# Patient Record
Sex: Male | Born: 1940
Health system: Southern US, Community
[De-identification: ages and names within clinical notes are randomized; demographics above are authoritative.]

## PROBLEM LIST (undated history)

## (undated) DIAGNOSIS — I10 Essential (primary) hypertension: Secondary | ICD-10-CM

## (undated) DIAGNOSIS — D72819 Decreased white blood cell count, unspecified: Secondary | ICD-10-CM

## (undated) DIAGNOSIS — M1990 Unspecified osteoarthritis, unspecified site: Secondary | ICD-10-CM

## (undated) DIAGNOSIS — E538 Deficiency of other specified B group vitamins: Secondary | ICD-10-CM

## (undated) DIAGNOSIS — J449 Chronic obstructive pulmonary disease, unspecified: Secondary | ICD-10-CM

## (undated) DIAGNOSIS — R7302 Impaired glucose tolerance (oral): Secondary | ICD-10-CM

## (undated) DIAGNOSIS — E785 Hyperlipidemia, unspecified: Secondary | ICD-10-CM

## (undated) DIAGNOSIS — J45909 Unspecified asthma, uncomplicated: Secondary | ICD-10-CM

## (undated) DIAGNOSIS — M8580 Other specified disorders of bone density and structure, unspecified site: Secondary | ICD-10-CM

## (undated) DIAGNOSIS — F1010 Alcohol abuse, uncomplicated: Secondary | ICD-10-CM

## (undated) DIAGNOSIS — N400 Enlarged prostate without lower urinary tract symptoms: Secondary | ICD-10-CM

## (undated) DIAGNOSIS — E119 Type 2 diabetes mellitus without complications: Secondary | ICD-10-CM

## (undated) DIAGNOSIS — K4190 Unilateral femoral hernia, without obstruction or gangrene, not specified as recurrent: Secondary | ICD-10-CM

## (undated) DIAGNOSIS — K4090 Unilateral inguinal hernia, without obstruction or gangrene, not specified as recurrent: Secondary | ICD-10-CM

## (undated) DIAGNOSIS — K432 Incisional hernia without obstruction or gangrene: Secondary | ICD-10-CM

## (undated) DIAGNOSIS — D649 Anemia, unspecified: Secondary | ICD-10-CM

## (undated) DIAGNOSIS — F0390 Unspecified dementia without behavioral disturbance: Secondary | ICD-10-CM

## (undated) DIAGNOSIS — D72821 Monocytosis (symptomatic): Secondary | ICD-10-CM

## (undated) HISTORY — DX: Type 2 diabetes mellitus without complications: E11.9

## (undated) HISTORY — PX: HEMORRHOID SURGERY: SHX153

## (undated) HISTORY — DX: Unilateral inguinal hernia, without obstruction or gangrene, not specified as recurrent: K40.90

## (undated) HISTORY — PX: HERNIA REPAIR: SHX51

## (undated) HISTORY — DX: Impaired glucose tolerance (oral): R73.02

## (undated) HISTORY — DX: Anemia, unspecified: D64.9

## (undated) HISTORY — DX: Essential (primary) hypertension: I10

## (undated) HISTORY — DX: Unilateral femoral hernia, without obstruction or gangrene, not specified as recurrent: K41.90

## (undated) HISTORY — DX: Unspecified asthma, uncomplicated: J45.909

## (undated) HISTORY — DX: Deficiency of other specified B group vitamins: E53.8

## (undated) HISTORY — DX: Hyperlipidemia, unspecified: E78.5

## (undated) HISTORY — DX: Unspecified osteoarthritis, unspecified site: M19.90

## (undated) HISTORY — DX: Alcohol abuse, uncomplicated: F10.10

## (undated) HISTORY — DX: Chronic obstructive pulmonary disease, unspecified: J44.9

## (undated) HISTORY — DX: Decreased white blood cell count, unspecified: D72.819

## (undated) HISTORY — DX: Incisional hernia without obstruction or gangrene: K43.2

## (undated) HISTORY — DX: Other specified disorders of bone density and structure, unspecified site: M85.80

## (undated) HISTORY — PX: TUMOR EXCISION: SHX421

## (undated) HISTORY — DX: Unspecified dementia, unspecified severity, without behavioral disturbance, psychotic disturbance, mood disturbance, and anxiety: F03.90

## (undated) HISTORY — DX: Monocytosis (symptomatic): D72.821

---

## 1998-07-14 ENCOUNTER — Encounter: Admission: RE | Admit: 1998-07-14 | Discharge: 1998-07-14 | Payer: Self-pay | Admitting: Internal Medicine

## 1998-07-16 ENCOUNTER — Encounter: Admission: RE | Admit: 1998-07-16 | Discharge: 1998-07-16 | Payer: Self-pay | Admitting: Hematology and Oncology

## 1999-07-07 ENCOUNTER — Emergency Department (HOSPITAL_COMMUNITY): Admission: EM | Admit: 1999-07-07 | Discharge: 1999-07-07 | Payer: Self-pay | Admitting: Emergency Medicine

## 2003-10-11 ENCOUNTER — Emergency Department (HOSPITAL_COMMUNITY): Admission: EM | Admit: 2003-10-11 | Discharge: 2003-10-11 | Payer: Self-pay | Admitting: Emergency Medicine

## 2003-12-25 ENCOUNTER — Emergency Department (HOSPITAL_COMMUNITY): Admission: EM | Admit: 2003-12-25 | Discharge: 2003-12-25 | Payer: Self-pay | Admitting: Emergency Medicine

## 2003-12-29 ENCOUNTER — Emergency Department (HOSPITAL_COMMUNITY): Admission: EM | Admit: 2003-12-29 | Discharge: 2003-12-29 | Payer: Self-pay | Admitting: Emergency Medicine

## 2004-05-03 ENCOUNTER — Emergency Department (HOSPITAL_COMMUNITY): Admission: EM | Admit: 2004-05-03 | Discharge: 2004-05-03 | Payer: Self-pay | Admitting: Emergency Medicine

## 2006-08-03 ENCOUNTER — Emergency Department (HOSPITAL_COMMUNITY): Admission: EM | Admit: 2006-08-03 | Discharge: 2006-08-03 | Payer: Self-pay | Admitting: Emergency Medicine

## 2007-02-25 ENCOUNTER — Emergency Department (HOSPITAL_COMMUNITY): Admission: EM | Admit: 2007-02-25 | Discharge: 2007-02-25 | Payer: Self-pay | Admitting: *Deleted

## 2009-07-12 ENCOUNTER — Emergency Department (HOSPITAL_COMMUNITY): Admission: EM | Admit: 2009-07-12 | Discharge: 2009-07-12 | Payer: Self-pay | Admitting: Emergency Medicine

## 2010-05-08 ENCOUNTER — Emergency Department (HOSPITAL_COMMUNITY): Admission: EM | Admit: 2010-05-08 | Discharge: 2010-05-08 | Payer: Self-pay | Admitting: Emergency Medicine

## 2010-10-20 LAB — DIFFERENTIAL
Basophils Absolute: 0 10*3/uL (ref 0.0–0.1)
Basophils Relative: 0 % (ref 0–1)
Eosinophils Absolute: 0 10*3/uL (ref 0.0–0.7)
Monocytes Absolute: 0.7 10*3/uL (ref 0.1–1.0)
Monocytes Relative: 12 % (ref 3–12)
Neutro Abs: 4.1 10*3/uL (ref 1.7–7.7)
Neutrophils Relative %: 70 % (ref 43–77)

## 2010-10-20 LAB — CBC
Hemoglobin: 13.8 g/dL (ref 13.0–17.0)
MCH: 31.2 pg (ref 26.0–34.0)
MCHC: 34.2 g/dL (ref 30.0–36.0)
RDW: 14.7 % (ref 11.5–15.5)

## 2010-10-20 LAB — POCT I-STAT, CHEM 8
Calcium, Ion: 1.14 mmol/L (ref 1.12–1.32)
Chloride: 107 mEq/L (ref 96–112)
Glucose, Bld: 107 mg/dL — ABNORMAL HIGH (ref 70–99)
HCT: 43 % (ref 39.0–52.0)
Hemoglobin: 14.6 g/dL (ref 13.0–17.0)

## 2010-10-20 LAB — POCT CARDIAC MARKERS

## 2011-05-23 LAB — URINE MICROSCOPIC-ADD ON

## 2011-05-23 LAB — URINALYSIS, ROUTINE W REFLEX MICROSCOPIC
Bilirubin Urine: NEGATIVE
Glucose, UA: NEGATIVE
Ketones, ur: NEGATIVE
Specific Gravity, Urine: 1.01
pH: 6

## 2011-05-23 LAB — URINE CULTURE: Colony Count: NO GROWTH

## 2012-01-10 ENCOUNTER — Ambulatory Visit (INDEPENDENT_AMBULATORY_CARE_PROVIDER_SITE_OTHER): Payer: Medicare Other | Admitting: Surgery

## 2012-01-10 ENCOUNTER — Encounter (INDEPENDENT_AMBULATORY_CARE_PROVIDER_SITE_OTHER): Payer: Self-pay | Admitting: Surgery

## 2012-01-10 VITALS — BP 98/64 | HR 86 | Temp 97.4°F | Resp 14 | Ht 73.0 in | Wt 184.0 lb

## 2012-01-10 DIAGNOSIS — K4090 Unilateral inguinal hernia, without obstruction or gangrene, not specified as recurrent: Secondary | ICD-10-CM

## 2012-01-10 DIAGNOSIS — E119 Type 2 diabetes mellitus without complications: Secondary | ICD-10-CM | POA: Insufficient documentation

## 2012-01-10 DIAGNOSIS — K432 Incisional hernia without obstruction or gangrene: Secondary | ICD-10-CM

## 2012-01-10 HISTORY — DX: Unilateral inguinal hernia, without obstruction or gangrene, not specified as recurrent: K40.90

## 2012-01-10 HISTORY — DX: Type 2 diabetes mellitus without complications: E11.9

## 2012-01-10 HISTORY — DX: Incisional hernia without obstruction or gangrene: K43.2

## 2012-01-10 NOTE — Progress Notes (Addendum)
Subjective:     Patient ID: Cory Kemp, male   DOB: Sep 12, 1940, 71 y.o.   MRN: [id number]  HPI  Cory Kemp  [birth year] [id number]  Patient Care Team: Massie Maroon, MD as PCP - General (Internal Medicine)  This patient is a 71 y.o.male who presents today for surgical evaluation at the request of Dr. Selena Batten.   Reason for visit: Right inguinal hernia.  Patient is a pleasant smoking male. Still working and moderately active. 2 months ago he noticed a lump in his right groin. This gradually increased. Occasionally uncomfortable. He has intentionally lost 40 pounds over the past year. He switched to a low sugar diet. Because of concerns, and his wife mentioned to his primary care physician and sent the patient to [place] for surgical evaluation.  He likes to travel out [place]. [place] with [employer]. 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   He is quite active. Continues to smoke but has pretty good activity level  Patient Active Problem List  Diagnoses  . Diabetes mellitus  . Inguinal hernia - right  . Incisional hernias - swiis cheeses type periumbilical    Past Medical History  Diagnosis Date  . Diabetes mellitus 01/10/2012  . Arthritis   . Hyperlipidemia   . Hypertension     Past Surgical History  Procedure Date  . Hemorrhoid surgery   . Tumor excision     History   Social History  . Marital Status: Single    Spouse Name: N/A    Number of Children: N/A  . Years of Education: N/A   Occupational History  . Not on file.   Social History Main Topics  . Smoking status: Not on file  . Smokeless tobacco: Not on file  . Alcohol Use:   . Drug Use:   . Sexually Active:    Other Topics Concern  . Not on file   Social History Narrative  . No narrative on file    History reviewed. No pertinent family history.  Current Outpatient Prescriptions  Medication Sig Dispense Refill  . cholecalciferol (VITAMIN D) 1000 UNITS tablet Take 1,000 Units by  mouth 2 (two) times daily.      . finasteride (PROSCAR) 5 MG tablet       . losartan (COZAAR) 50 MG tablet       . metFORMIN (GLUCOPHAGE) 500 MG tablet       . simvastatin (ZOCOR) 40 MG tablet       . Tamsulosin HCl (FLOMAX PO) Take by mouth.         No Known Allergies  BP 98/64  Pulse 86  Temp(Src) 97.4 F (36.3 C) (Temporal)  Resp 14  Ht 6\' 1"  (1.854 m)  Wt 184 lb (83.462 kg)  BMI 24.28 kg/m2  No results found.   Review of Systems  Constitutional: Negative for fever, chills and diaphoresis.  HENT: Negative for hearing loss, nosebleeds, sore throat, facial swelling, mouth sores, trouble swallowing and ear discharge.   Eyes: Negative for photophobia, discharge and visual disturbance.  Respiratory: Negative for choking, chest tightness, shortness of breath and stridor.   Cardiovascular: Negative for chest pain and palpitations.       Patient walks 1.5 miles without difficulty.  No exertional chest/neck/shoulder/arm pain.  Gastrointestinal: Negative for nausea, vomiting, abdominal pain, diarrhea, constipation, blood in stool, abdominal distention, anal bleeding and rectal pain.       No personal nor family history of GI/colon  cancer, inflammatory bowel disease, irritable bowel syndrome, allergy such as Celiac Sprue, dietary/dairy problems, colitis, ulcers nor gastritis.    No recent sick contacts/gastroenteritis.  No travel outside the country.  No changes in diet.  BM daily  Genitourinary: Positive for difficulty urinating. Negative for dysuria, urgency, frequency and testicular pain.  Musculoskeletal: Negative for myalgias, back pain, arthralgias and gait problem.  Skin: Negative for color change, pallor, rash and wound.  Neurological: Negative for dizziness, speech difficulty, weakness, numbness and headaches.  Hematological: Negative for adenopathy. Does not bruise/bleed easily.  Psychiatric/Behavioral: Negative for hallucinations, confusion and agitation.         Objective:   Physical Exam  Constitutional: He is oriented to person, place, and time. He appears well-developed and well-nourished. No distress.  HENT:  Head: Normocephalic.  Mouth/Throat: Oropharynx is clear and moist. No oropharyngeal exudate.  Eyes: Conjunctivae and EOM are normal. Pupils are equal, round, and reactive to light. No scleral icterus.  Neck: Normal range of motion. Neck supple. No tracheal deviation present.  Cardiovascular: Normal rate, regular rhythm and intact distal pulses.   Pulmonary/Chest: Effort normal and breath sounds normal. No respiratory distress.  Abdominal: Soft. He exhibits no distension. There is no tenderness. There is no CVA tenderness and no tenderness at McBurney's point. A hernia is present. Hernia confirmed positive in the ventral area and confirmed positive in the right inguinal area. Hernia confirmed negative in the left inguinal area.    Musculoskeletal: Normal range of motion. He exhibits no tenderness.  Lymphadenopathy:    He has no cervical adenopathy.       Right: No inguinal adenopathy present.       Left: No inguinal adenopathy present.  Neurological: He is alert and oriented to person, place, and time. No cranial nerve deficit. He exhibits normal muscle tone. Coordination normal.  Skin: Skin is warm and dry. No rash noted. He is not diaphoretic. No erythema. No pallor.  Psychiatric: He has a normal mood and affect. His behavior is normal. Judgment and thought content normal.       Assessment:     RIH & periumbilical incisional VWH    Plan:     I think he would benefit from surgery to repair the hernias. Definitely the right inguinal one. He was interested in having the periumbilical hernia was repaired at the same time. He initially was hoping to get back to work and travel within a week. I cautioned him that he will need time to recover from this.   Eventually he should be able to get back to regular activity within reason. Did not  push through pain now nor after surgery  The anatomy & physiology of the abdominal wall and pelvic floor was discussed.  The pathophysiology of hernias in the inguinal and pelvic region was discussed.  Natural history risks such as progressive enlargement, pain, incarceration & strangulation was discussed.   Contributors to complications such as smoking, obesity, diabetes, prior surgery, etc were discussed.    I feel the risks of no intervention will lead to serious problems that outweigh the operative risks; therefore, I recommended surgery to reduce and repair the hernia.  I explained laparoscopic techniques with possible need for an open approach.  I noted usual use of mesh to patch and/or buttress hernia repair  Risks such as bleeding, infection, abscess, need for further treatment, heart attack, death, and other risks were discussed.  I noted a good likelihood this will help address the problem.  Goals of post-operative recovery were discussed as well.  Possibility that this will not correct all symptoms was explained.  I stressed the importance of low-impact activity, aggressive pain control, avoiding constipation, & not pushing through pain to minimize risk of post-operative chronic pain or injury. Possibility of reherniation was discussed.  We will work to minimize complications.     An educational handout further explaining the pathology & treatment options was given as well.  Questions were answered.  The patient & his wife express understanding & wishes to proceed with surgery.  We talked to the patient about the dangers of smoking.  We stressed that tobacco use dramatically increases the risk of peri-operative complications such as infection, tissue necrosis leaving to problems with incision/wound and organ healing, heart attack, stroke, DVT, pulmonary embolism, and death.  We noted there are programs in our community to help stop smoking.

## 2012-01-10 NOTE — Patient Instructions (Signed)
Hernia  A hernia occurs when an internal organ pushes out through a weak spot in the abdominal wall. Hernias most commonly occur in the groin and around the navel. Hernias often can be pushed back into place (reduced). Most hernias tend to get worse over time. Some abdominal hernias can get stuck in the opening (irreducible or incarcerated hernia) and cannot be reduced. An irreducible abdominal hernia which is tightly squeezed into the opening is at risk for impaired blood supply (strangulated hernia). A strangulated hernia is a medical emergency. Because of the risk for an irreducible or strangulated hernia, surgery may be recommended to repair a hernia.  CAUSES    Heavy lifting.   Prolonged coughing.   Straining to have a bowel movement.   A cut (incision) made during an abdominal surgery.  HOME CARE INSTRUCTIONS    Bed rest is not required. You may continue your normal activities.   Avoid lifting more than 10 pounds (4.5 kg) or straining.   Cough gently. If you are a smoker it is best to stop. Even the best hernia repair can break down with the continual strain of coughing. Even if you do not have your hernia repaired, a cough will continue to aggravate the problem.   Do not wear anything tight over your hernia. Do not try to keep it in with an outside bandage or truss. These can damage abdominal contents if they are trapped within the hernia sac.   Eat a normal diet.   Avoid constipation. Straining over long periods of time will increase hernia size and encourage breakdown of repairs. If you cannot do this with diet alone, stool softeners may be used.  SEEK IMMEDIATE MEDICAL CARE IF:    You have a fever.   You develop increasing abdominal pain.   You feel nauseous or vomit.   Your hernia is stuck outside the abdomen, looks discolored, feels hard, or is tender.   You have any changes in your bowel habits or in the hernia that are unusual for you.   You have increased pain or swelling around the  hernia.   You cannot push the hernia back in place by applying gentle pressure while lying down.  MAKE SURE YOU:    Understand these instructions.   Will watch your condition.   Will get help right away if you are not doing well or get worse.  Document Released: 07/25/2005 Document Revised: 07/14/2011 Document Reviewed: 03/13/2008  ExitCare Patient Information 2012 [employer].

## 2012-02-13 ENCOUNTER — Encounter (HOSPITAL_COMMUNITY): Payer: Self-pay | Admitting: Pharmacy Technician

## 2012-02-20 ENCOUNTER — Encounter (HOSPITAL_COMMUNITY)
Admission: RE | Admit: 2012-02-20 | Discharge: 2012-02-20 | Disposition: A | Discharge status: Home / Self Care | Payer: Medicare Other | Source: Ambulatory Visit | Attending: Surgery | Admitting: Surgery

## 2012-02-20 ENCOUNTER — Encounter (HOSPITAL_COMMUNITY): Payer: Self-pay

## 2012-02-20 ENCOUNTER — Ambulatory Visit (HOSPITAL_COMMUNITY)
Admission: RE | Admit: 2012-02-20 | Discharge: 2012-02-20 | Disposition: A | Discharge status: Home / Self Care | Payer: Medicare Other | Source: Ambulatory Visit | Attending: Surgery | Admitting: Surgery

## 2012-02-20 DIAGNOSIS — Z01812 Encounter for preprocedural laboratory examination: Secondary | ICD-10-CM | POA: Insufficient documentation

## 2012-02-20 DIAGNOSIS — Z01818 Encounter for other preprocedural examination: Secondary | ICD-10-CM | POA: Insufficient documentation

## 2012-02-20 LAB — CBC
MCHC: 33.5 g/dL (ref 30.0–36.0)
Platelets: 194 10*3/uL (ref 150–400)
RDW: 14.6 % (ref 11.5–15.5)
WBC: 3.1 10*3/uL — ABNORMAL LOW (ref 4.0–10.5)

## 2012-02-20 LAB — BASIC METABOLIC PANEL
BUN: 15 mg/dL (ref 6–23)
Chloride: 102 mEq/L (ref 96–112)
GFR calc Af Amer: >90 mL/min (ref >90)
GFR calc non Af Amer: 87 mL/min — ABNORMAL LOW (ref >90)
Potassium: 4.7 mEq/L (ref 3.5–5.1)

## 2012-02-20 LAB — SURGICAL PCR SCREEN: MRSA, PCR: NEGATIVE

## 2012-02-20 NOTE — Progress Notes (Signed)
02/20/12 0913  OBSTRUCTIVE SLEEP APNEA  Have you ever been diagnosed with sleep apnea through a sleep study? No  Do you snore loudly (loud enough to be heard through closed doors)?  1  Do you often feel tired, fatigued, or sleepy during the daytime? 0  Has anyone observed you stop breathing during your sleep? 0  Do you have, or are you being treated for high blood pressure? 1  BMI more than 35 kg/m2? 0  Age over 71 years old? 1  Neck circumference greater than 40 cm/18 inches? 0  Gender: 1  Obstructive Sleep Apnea Score 4   Score 4 or greater  Updated health history

## 2012-02-20 NOTE — Patient Instructions (Addendum)
20 TRESON LAURA  02/20/2012   Your procedure is scheduled on:  Friday 02/24/2012 at 0730 am  Report to [facility] at 0530 AM.  Call this number if you have problems the morning of surgery: [phone]   Remember:   Do not eat food:After Midnight.  May have clear liquids:until Midnight .    Take these medicines the morning of surgery with A SIP OF WATER: none   Do not wear jewelry  Do not wear lotions, powders, or perfumes.   Do not shave 48 hours prior to surgery. Men may shave face and neck.  Do not bring valuables to the hospital.  Contacts, dentures or bridgework may not be worn into surgery.       Patients discharged the day of surgery will not be allowed to drive home.  Name and phone number of your driver: Annice Pih Bowen-significant other-cell-[phone]  Special Instructions: CHG Shower Use Special Wash: 1/2 bottle night before surgery and 1/2 bottle morning of surgery.   Please read over the following fact sheets that you were given: MRSA Information, sleep apnea sheet, incentive spirometry sheet               If you have any questions, please call me at [phone] [facility].Georgeanna Lea, RN,BSN

## 2012-02-24 ENCOUNTER — Encounter (HOSPITAL_COMMUNITY)
Admission: RE | Disposition: A | Discharge status: Home / Self Care | Payer: Self-pay | Source: Ambulatory Visit | Attending: Surgery

## 2012-02-24 ENCOUNTER — Ambulatory Visit (HOSPITAL_COMMUNITY)
Admission: RE | Admit: 2012-02-24 | Discharge: 2012-02-24 | Disposition: A | Discharge status: Home / Self Care | Payer: Medicare Other | Source: Ambulatory Visit | Attending: Surgery | Admitting: Surgery

## 2012-02-24 ENCOUNTER — Encounter (HOSPITAL_COMMUNITY): Payer: Self-pay | Admitting: *Deleted

## 2012-02-24 ENCOUNTER — Encounter (HOSPITAL_COMMUNITY): Payer: Self-pay | Admitting: Anesthesiology

## 2012-02-24 ENCOUNTER — Ambulatory Visit (HOSPITAL_COMMUNITY): Payer: Medicare Other | Admitting: Anesthesiology

## 2012-02-24 DIAGNOSIS — D176 Benign lipomatous neoplasm of spermatic cord: Secondary | ICD-10-CM | POA: Insufficient documentation

## 2012-02-24 DIAGNOSIS — E669 Obesity, unspecified: Secondary | ICD-10-CM | POA: Insufficient documentation

## 2012-02-24 DIAGNOSIS — K432 Incisional hernia without obstruction or gangrene: Secondary | ICD-10-CM | POA: Insufficient documentation

## 2012-02-24 DIAGNOSIS — K439 Ventral hernia without obstruction or gangrene: Secondary | ICD-10-CM

## 2012-02-24 DIAGNOSIS — E119 Type 2 diabetes mellitus without complications: Secondary | ICD-10-CM | POA: Insufficient documentation

## 2012-02-24 DIAGNOSIS — D1739 Benign lipomatous neoplasm of skin and subcutaneous tissue of other sites: Secondary | ICD-10-CM | POA: Insufficient documentation

## 2012-02-24 DIAGNOSIS — K4090 Unilateral inguinal hernia, without obstruction or gangrene, not specified as recurrent: Secondary | ICD-10-CM

## 2012-02-24 DIAGNOSIS — F17200 Nicotine dependence, unspecified, uncomplicated: Secondary | ICD-10-CM | POA: Insufficient documentation

## 2012-02-24 DIAGNOSIS — K4190 Unilateral femoral hernia, without obstruction or gangrene, not specified as recurrent: Secondary | ICD-10-CM | POA: Insufficient documentation

## 2012-02-24 HISTORY — PX: INGUINAL HERNIA REPAIR: SHX194

## 2012-02-24 HISTORY — PX: VENTRAL HERNIA REPAIR: SHX424

## 2012-02-24 LAB — GLUCOSE, CAPILLARY
Glucose-Capillary: 116 mg/dL — ABNORMAL HIGH (ref 70–99)
Glucose-Capillary: 136 mg/dL — ABNORMAL HIGH (ref 70–99)

## 2012-02-24 SURGERY — REPAIR, HERNIA, VENTRAL, LAPAROSCOPIC
Anesthesia: General | Site: Abdomen | Wound class: Clean

## 2012-02-24 MED ORDER — HYDROMORPHONE HCL PF 1 MG/ML IJ SOLN: 0.2500 mg | INTRAMUSCULAR | Status: DC | PRN

## 2012-02-24 MED ORDER — LACTATED RINGERS IV SOLN
INTRAVENOUS | Status: DC | PRN
  Administered 2012-02-24 (×3): via INTRAVENOUS

## 2012-02-24 MED ORDER — MIDAZOLAM HCL 5 MG/5ML IJ SOLN
INTRAMUSCULAR | Status: DC | PRN
  Administered 2012-02-24: 2 mg via INTRAVENOUS

## 2012-02-24 MED ORDER — PROPOFOL 10 MG/ML IV BOLUS
INTRAVENOUS | Status: DC | PRN
  Administered 2012-02-24: 200 mg via INTRAVENOUS

## 2012-02-24 MED ORDER — BUPIVACAINE 0.25 % ON-Q PUMP DUAL CATH 300 ML
INJECTION | Status: DC | PRN
  Administered 2012-02-24: 300 mL

## 2012-02-24 MED ORDER — ROCURONIUM BROMIDE 100 MG/10ML IV SOLN
INTRAVENOUS | Status: DC | PRN
  Administered 2012-02-24: 60 mg via INTRAVENOUS
  Administered 2012-02-24: 10 mg via INTRAVENOUS
  Administered 2012-02-24 (×3): 20 mg via INTRAVENOUS

## 2012-02-24 MED ORDER — NEOSTIGMINE METHYLSULFATE 1 MG/ML IJ SOLN
INTRAMUSCULAR | Status: DC | PRN
  Administered 2012-02-24: 4 mg via INTRAVENOUS

## 2012-02-24 MED ORDER — CEFAZOLIN SODIUM-DEXTROSE 2-3 GM-% IV SOLR
2.0000 g | INTRAVENOUS | Status: AC
  Administered 2012-02-24: 2 g via INTRAVENOUS

## 2012-02-24 MED ORDER — BUPIVACAINE-EPINEPHRINE 0.25% -1:200000 IJ SOLN
INTRAMUSCULAR | Status: AC
  Filled 2012-02-24: qty 1

## 2012-02-24 MED ORDER — BUPIVACAINE-EPINEPHRINE 0.25% -1:200000 IJ SOLN
INTRAMUSCULAR | Status: DC | PRN
  Administered 2012-02-24: 50 mL

## 2012-02-24 MED ORDER — OXYCODONE HCL 5 MG PO TABS: 5.0000 mg | ORAL_TABLET | ORAL | Status: AC | PRN

## 2012-02-24 MED ORDER — PROMETHAZINE HCL 25 MG/ML IJ SOLN: 6.2500 mg | INTRAMUSCULAR | Status: DC | PRN

## 2012-02-24 MED ORDER — CEFAZOLIN SODIUM-DEXTROSE 2-3 GM-% IV SOLR
INTRAVENOUS | Status: AC
  Filled 2012-02-24: qty 50

## 2012-02-24 MED ORDER — STERILE WATER FOR IRRIGATION IR SOLN
Status: DC | PRN
  Administered 2012-02-24: 1000 mL

## 2012-02-24 MED ORDER — ACETAMINOPHEN 10 MG/ML IV SOLN
INTRAVENOUS | Status: AC
  Filled 2012-02-24: qty 100

## 2012-02-24 MED ORDER — MUPIROCIN 2 % EX OINT
TOPICAL_OINTMENT | CUTANEOUS | Status: AC
  Filled 2012-02-24: qty 22

## 2012-02-24 MED ORDER — MEPERIDINE HCL 50 MG/ML IJ SOLN
6.2500 mg | INTRAMUSCULAR | Status: DC | PRN
Start: 2012-02-24 — End: 2012-02-24

## 2012-02-24 MED ORDER — BUPIVACAINE 0.25 % ON-Q PUMP DUAL CATH 300 ML
300.0000 mL | INJECTION | Status: DC
  Filled 2012-02-24: qty 300

## 2012-02-24 MED ORDER — ONDANSETRON HCL 4 MG/2ML IJ SOLN
INTRAMUSCULAR | Status: DC | PRN
  Administered 2012-02-24: 4 mg via INTRAVENOUS

## 2012-02-24 MED ORDER — BUPIVACAINE HCL 0.25 % IJ SOLN
INTRAMUSCULAR | Status: AC
  Filled 2012-02-24: qty 1

## 2012-02-24 MED ORDER — KETOROLAC TROMETHAMINE 30 MG/ML IJ SOLN
INTRAMUSCULAR | Status: DC | PRN
  Administered 2012-02-24: 30 mg via INTRAVENOUS

## 2012-02-24 MED ORDER — FENTANYL CITRATE 0.05 MG/ML IJ SOLN
INTRAMUSCULAR | Status: DC | PRN
  Administered 2012-02-24: 100 ug via INTRAVENOUS
  Administered 2012-02-24: 50 ug via INTRAVENOUS
  Administered 2012-02-24: 100 ug via INTRAVENOUS

## 2012-02-24 MED ORDER — HYDROMORPHONE HCL PF 1 MG/ML IJ SOLN
INTRAMUSCULAR | Status: DC | PRN
  Administered 2012-02-24 (×5): .4 mg via INTRAVENOUS

## 2012-02-24 MED ORDER — LACTATED RINGERS IV SOLN: INTRAVENOUS | Status: DC

## 2012-02-24 MED ORDER — LIDOCAINE HCL (CARDIAC) 20 MG/ML IV SOLN
INTRAVENOUS | Status: DC | PRN
  Administered 2012-02-24: 75 mg via INTRAVENOUS

## 2012-02-24 MED ORDER — TAMSULOSIN HCL 0.4 MG PO CAPS
0.4000 mg | ORAL_CAPSULE | Freq: Every day | ORAL | Status: DC
  Administered 2012-02-24: 0.4 mg via ORAL
  Filled 2012-02-24 (×2): qty 1

## 2012-02-24 MED ORDER — OXYCODONE HCL 5 MG PO TABS
5.0000 mg | ORAL_TABLET | ORAL | Status: AC | PRN
  Administered 2012-02-24 (×2): 5 mg via ORAL

## 2012-02-24 MED ORDER — GLYCOPYRROLATE 0.2 MG/ML IJ SOLN
INTRAMUSCULAR | Status: DC | PRN
  Administered 2012-02-24: 0.6 mg via INTRAVENOUS

## 2012-02-24 MED ORDER — OXYCODONE HCL 5 MG PO TABS
ORAL_TABLET | ORAL | Status: AC
  Filled 2012-02-24: qty 1

## 2012-02-24 MED ORDER — ACETAMINOPHEN 10 MG/ML IV SOLN
INTRAVENOUS | Status: DC | PRN
  Administered 2012-02-24: 1000 mg via INTRAVENOUS

## 2012-02-24 MED ORDER — LACTATED RINGERS IR SOLN
Status: DC | PRN
  Administered 2012-02-24: 1000 mL

## 2012-02-24 SURGICAL SUPPLY — 53 items
APPLIER CLIP 5 13 M/L LIGAMAX5 (MISCELLANEOUS)
BINDER ABD UNIV 12 45-62 (WOUND CARE) IMPLANT
BINDER ABDOMINAL 46IN 62IN (WOUND CARE)
CANISTER SUCTION 2500CC (MISCELLANEOUS) ×2 IMPLANT
CATH KIT ON Q 10IN SLV (PAIN MANAGEMENT) ×4 IMPLANT
CHLORAPREP W/TINT 26ML (MISCELLANEOUS) ×2 IMPLANT
CLIP APPLIE 5 13 M/L LIGAMAX5 (MISCELLANEOUS) IMPLANT
CLOTH BEACON ORANGE TIMEOUT ST (SAFETY) ×2 IMPLANT
DECANTER SPIKE VIAL GLASS SM (MISCELLANEOUS) ×2 IMPLANT
DEVICE SECURE STRAP 25 ABSORB (INSTRUMENTS) ×4 IMPLANT
DEVICE TROCAR PUNCTURE CLOSURE (ENDOMECHANICALS) ×2 IMPLANT
DISSECTOR BLUNT TIP ENDO 5MM (MISCELLANEOUS) IMPLANT
DRAPE LAPAROSCOPIC ABDOMINAL (DRAPES) ×2 IMPLANT
DRAPE WARM FLUID 44X44 (DRAPE) ×2 IMPLANT
DRSG TEGADERM 2-3/8X2-3/4 SM (GAUZE/BANDAGES/DRESSINGS) ×6 IMPLANT
DRSG TEGADERM 4X4.75 (GAUZE/BANDAGES/DRESSINGS) ×2 IMPLANT
ELECT REM PT RETURN 9FT ADLT (ELECTROSURGICAL) ×2
ELECTRODE REM PT RTRN 9FT ADLT (ELECTROSURGICAL) ×1 IMPLANT
FILTER SMOKE EVAC LAPAROSHD (FILTER) IMPLANT
GAUZE SPONGE 2X2 8PLY STRL LF (GAUZE/BANDAGES/DRESSINGS) IMPLANT
GLOVE BIOGEL PI IND STRL 7.0 (GLOVE) ×1 IMPLANT
GLOVE BIOGEL PI INDICATOR 7.0 (GLOVE) ×1
GLOVE ECLIPSE 8.0 STRL XLNG CF (GLOVE) ×2 IMPLANT
GLOVE INDICATOR 8.0 STRL GRN (GLOVE) ×4 IMPLANT
GOWN STRL NON-REIN LRG LVL3 (GOWN DISPOSABLE) ×2 IMPLANT
GOWN STRL REIN XL XLG (GOWN DISPOSABLE) ×4 IMPLANT
HAND ACTIVATED (MISCELLANEOUS) IMPLANT
KIT BASIN OR (CUSTOM PROCEDURE TRAY) ×2 IMPLANT
MESH PHYSIO OVAL 15X20CM (Mesh General) ×2 IMPLANT
MESH ULTRAPRO 6X6 15CM15CM (Mesh General) ×2 IMPLANT
NEEDLE INSUFFLATION 14GA 120MM (NEEDLE) IMPLANT
NEEDLE SPNL 22GX3.5 QUINCKE BK (NEEDLE) IMPLANT
NS IRRIG 1000ML POUR BTL (IV SOLUTION) ×2 IMPLANT
PEN SKIN MARKING BROAD (MISCELLANEOUS) IMPLANT
PENCIL BUTTON HOLSTER BLD 10FT (ELECTRODE) ×2 IMPLANT
SCISSORS LAP 5X35 DISP (ENDOMECHANICALS) ×2 IMPLANT
SET IRRIG TUBING LAPAROSCOPIC (IRRIGATION / IRRIGATOR) ×2 IMPLANT
SLEEVE Z-THREAD 5X100MM (TROCAR) ×4 IMPLANT
SPONGE GAUZE 2X2 STER 10/PKG (GAUZE/BANDAGES/DRESSINGS)
STRIP CLOSURE SKIN 1/2X4 (GAUZE/BANDAGES/DRESSINGS) IMPLANT
SUT MNCRL AB 4-0 PS2 18 (SUTURE) ×2 IMPLANT
SUT PROLENE 1 CT 1 30 (SUTURE) ×14 IMPLANT
SUT VIC AB 2-0 UR6 27 (SUTURE) ×2 IMPLANT
TACKER 5MM HERNIA 3.5CML NAB (ENDOMECHANICALS) IMPLANT
TOWEL OR 17X26 10 PK STRL BLUE (TOWEL DISPOSABLE) ×2 IMPLANT
TRAY FOLEY CATH 14FRSI W/METER (CATHETERS) ×2 IMPLANT
TRAY LAP CHOLE (CUSTOM PROCEDURE TRAY) ×2 IMPLANT
TROCAR XCEL BLADELESS 5X75MML (TROCAR) ×2 IMPLANT
TROCAR XCEL BLUNT TIP 100MML (ENDOMECHANICALS) ×2 IMPLANT
TROCAR Z-THREAD FIOS 11X100 BL (TROCAR) ×2 IMPLANT
TROCAR Z-THREAD FIOS 5X100MM (TROCAR) ×4 IMPLANT
TUBING INSUFFLATION 10FT LAP (TUBING) ×2 IMPLANT
TUNNELER SHEATH ON-Q 16GX12 DP (PAIN MANAGEMENT) ×2 IMPLANT

## 2012-02-24 NOTE — Progress Notes (Signed)
Up to bathroom c assist  Unable to void  Return to room, up on side of bed per pt request.  tol well

## 2012-02-24 NOTE — Anesthesia Postprocedure Evaluation (Signed)
  Anesthesia Post-op Note  Patient: Cory Kemp  Procedure(s) Performed: Procedure(s) (LRB): LAPAROSCOPIC VENTRAL HERNIA (N/A) LAPAROSCOPIC INGUINAL HERNIA (N/A) INSERTION OF MESH (N/A)  Patient Location: PACU  Anesthesia Type: General  Level of Consciousness: awake and alert   Airway and Oxygen Therapy: Patient Spontanous Breathing  Post-op Pain: mild  Post-op Assessment: Post-op Vital signs reviewed, Patient's Cardiovascular Status Stable, Respiratory Function Stable, Patent Airway and No signs of Nausea or vomiting  Post-op Vital Signs: stable  Complications: No apparent anesthesia complications

## 2012-02-24 NOTE — Progress Notes (Signed)
Patient and family made aware that he has one more treatment of mupirocin 2% ointment to use this pm, for total of 10 doses

## 2012-02-24 NOTE — Progress Notes (Signed)
Instructions given to pt on pain pump and pt also has written instructions

## 2012-02-24 NOTE — Transfer of Care (Signed)
Immediate Anesthesia Transfer of Care Note  Patient: Cory Kemp  Procedure(s) Performed: Procedure(s) (LRB): LAPAROSCOPIC VENTRAL HERNIA (N/A) LAPAROSCOPIC INGUINAL HERNIA (N/A) INSERTION OF MESH (N/A)  Patient Location: PACU  Anesthesia Type: General  Level of Consciousness: awake, alert  and patient cooperative  Airway & Oxygen Therapy: Patient Spontanous Breathing and Patient connected to face mask oxygen  Post-op Assessment: Report given to PACU RN and Post -op Vital signs reviewed and stable  Post vital signs: Reviewed and stable  Complications: No apparent anesthesia complications

## 2012-02-24 NOTE — Anesthesia Preprocedure Evaluation (Addendum)
Anesthesia Evaluation  Patient identified by MRN, date of birth, ID band Patient awake    Reviewed: Allergy & Precautions, H&P , NPO status , Patient's Chart, lab work & pertinent test results  Airway Mallampati: II TM Distance: >3 FB Neck ROM: Full    Dental No notable dental hx. (+) Poor Dentition   Pulmonary neg pulmonary ROS, COPDCurrent Smoker,  breath sounds clear to auscultation  Pulmonary exam normal       Cardiovascular hypertension, Pt. on medications negative cardio ROS  Rhythm:Regular Rate:Normal     Neuro/Psych negative neurological ROS  negative psych ROS   GI/Hepatic negative GI ROS, Neg liver ROS,   Endo/Other  negative endocrine ROSType 2, Oral Hypoglycemic Agents  Renal/GU negative Renal ROS  negative genitourinary   Musculoskeletal negative musculoskeletal ROS (+)   Abdominal   Peds negative pediatric ROS (+)  Hematology negative hematology ROS (+)   Anesthesia Other Findings Multiple decayed upper front teeth. Pt denies loose  Reproductive/Obstetrics negative OB ROS                          Anesthesia Physical Anesthesia Plan  ASA: III  Anesthesia Plan: General   Post-op Pain Management:    Induction: Intravenous  Airway Management Planned: Oral ETT  Additional Equipment:   Intra-op Plan:   Post-operative Plan: Extubation in OR  Informed Consent: I have reviewed the patients History and Physical, chart, labs and discussed the procedure including the risks, benefits and alternatives for the proposed anesthesia with the patient or authorized representative who has indicated his/her understanding and acceptance.   Dental advisory given  Plan Discussed with: CRNA  Anesthesia Plan Comments:        Anesthesia Quick Evaluation

## 2012-02-24 NOTE — Op Note (Addendum)
02/24/2012  10:35 AM  PATIENT:  Cory Kemp  71 y.o. male  Patient Care Team: Massie Maroon, MD as PCP - General (Internal Medicine)  PRE-OPERATIVE DIAGNOSIS:  Right inguinal hernia & ventral wall hernias  POST-OPERATIVE DIAGNOSIS:    Abd wall lipomas right inguinal hernia ventral herniae x3  PROCEDURE:  Procedure(s): LAPAROSCOPIC VENTRAL HERNIA LAPAROSCOPIC right INGUINAL HERNIA repair Laparoscopic right femoral hernia repair INSERTION OF MESH Excision of abd wall lipomas  SURGEON:  Surgeon(s): Ardeth Sportsman, MD  ASSISTANT: none   ANESTHESIA:   local and general  EBL:  Total I/O In: 2000 [I.V.:2000] Out: 175 [Urine:175]  Delay start of Pharmacological VTE agent (>24hrs) due to surgical blood loss or risk of bleeding:  no  DRAINS: ON-Q pump placement   SPECIMEN:  No Specimen  DISPOSITION OF SPECIMEN:  PATHOLOGY  COUNTS:  YES  PLAN OF CARE: Discharge to home after PACU  PATIENT DISPOSITION:  PACU - hemodynamically stable.  INDICATION: Pleasant smoking male with prior abdominal surgeries.  Found to have painful right or hernia.  Also at least one probable to periumbilical incisional hernias as well.  I recommended a laparoscopic exploration and repair with mesh:  The anatomy & physiology of the abdominal wall was discussed.  The pathophysiology of hernias was discussed.  Natural history risks without surgery including progeressive enlargement, pain, incarceration & strangulation was discussed.   Contributors to complications such as smoking, obesity, diabetes, prior surgery, etc were discussed.   I feel the risks of no intervention will lead to serious problems that outweigh the operative risks; therefore, I recommended surgery to reduce and repair the hernia.  I explained laparoscopic techniques with possible need for an open approach.  I noted the probable use of mesh to patch and/or buttress the hernia repair  Risks such as bleeding, infection, abscess, need  for further treatment, heart attack, death, and other risks were discussed.  I noted a good likelihood this will help address the problem.   Goals of post-operative recovery were discussed as well.  Possibility that this will not correct all symptoms was explained.  I stressed the importance of low-impact activity, aggressive pain control, avoiding constipation, & not pushing through pain to minimize risk of post-operative chronic pain or injury. Possibility of reherniation especially with smoking, obesity, diabetes, immunosuppression, and other health conditions was discussed.  We will work to minimize complications.     Questions were answered.  The patient expresses understanding & wishes to proceed with surgery.   OR FINDINGS: He had Swiss cheese hernias periumbilically.  9 x 3 cm region.  Most 1-2 cm in size.  Omentum within some.  He had an indirect right inguinal hernia.  He had a 3 cm left upper quadrant lipoma in the subcutaneous tissues.  He had a 2 cm lipoma in the left lower quadrant subcutaneous tissues.  DESCRIPTION:   Informed consent was confirmed.  The patient underwent general anaesthesia without difficulty.  The patient was positioned appropriately.  VTE prevention in place.  The patient's abdomen was clipped, prepped, & draped in a sterile fashion.  Surgical timeout confirmed our plan.  The patient was positioned in reverse Trendelenburg.  Abdominal entry was gained using optical entry technique in the left upper abdomen.  I felt a soft tissue mass there.  I did a transverse incision.  I encountered a lobulated soft lipoma.  I freed it and excised off the fascia.  I then proceeded to place a 5 mm port through  the wound.Entry was clean.  I induced carbon dioxide insufflation.  Camera inspection revealed no injury.  Extra ports were carefully placed under direct laparoscopic visualization.  I placed the left lower quadrant abdominal port through an incision that also was used to  excise a 2 cm lipoma of the anterior abdominal wall as well.  I encountered moderate lesions of greater omentum on the midline from its prior incision.  I carefully freed these off using cold scissors with occasional focused cautery.  As I freed that off I encountered the obvious periumbilical ventral hernias were encountered some further cephalad as I released off the falciform ligament as well.  There were no hernias infraumbilically on the anterior abdominal wall.  I inspected the pelvis.  He had no hernias on the left side But an obvious indirect hernia and a probable femoral hernia on the right.  I proceed with a TAPP procedure.  I scored the peritoneum infraumbilically from the left paramedian towards the right flank in a transverse fashion.  I freed the peritoneum off the anterior abdominal wall sharply and bluntly.  I found the moderate peritoneal hernia sac going up into a dilated internal ring consistent with an indirect hernia on the right side.  I skeletonized the hernia sac off the cord structures and reduced that.  I also encountered some moderately large cord lipomas associated with the hernia sac.  I skeletonized those off the spermatic vessels.  I morcellated move those up the umbilical port.  Because they were obvious cord lipomas I did not send them for pathology.  I freed peritoneum off the pelvic brim and freed off the anterior medial bladder is well.  During that off I encountered an obvious right femoral hernia as well.  Some preperitoneal fat within it.  I chose a 15 x 15 cm ultralight polypropylene mesh (Ultrapro) and I cut a sigmoid slit in it.  I laid it as a diamond such that a 6 x 6 cm flap resting in the true pelvis.  It overlapped across midline.  It provided at least 2 inches circumferential coverage around the internal ring repairing hernia.  The direct space was covered.  The femoral hernia was covered as well.  I brought up the peritoneum and tacked it back up to the  anterior abdominal wall and pelvis.  I've had a few breaches the peritoneum but I closed those with the help of the absorbable tacker.  Mesh was covered.  I measured the Swiss cheese hernia region.  I chose a 20 x 15 cm Physiomesh (ultralight polypropylene/Monocryl).  I rolled in the abdomen secured it into the anterior abdominal wall.  I laid it vertically but somewhat obliquely as the supraumbilical fascial defects were paramedian on the right side.  I secured it to the anterior abdominal wall using #1 Prolene interrupted stitches x14 using a laparoscopic suture passer.  I used absorbable tacker to tack around the rims and central area.  I used some of the extra peritoneum edge from the TAPP  that over the inferior edge of the ventral hernia mesh as well.  I inspected the abdomen.  Hemostasis is excellent.  There is no evidence of bowel or other injury.  I placed the On-Q catheters in the preperitoneal plane through a subxiphoid puncture sites under direct laparoscopic visualization.  I closed the periumbilical fascial defect defect transversely as I placed a 10 mm port through that.  I made certain there was no breach into the perineal cavity without with  that.  I backward carbon dioxide remove the ports.  I closed the port sites using 4 Monocryl stitch.  I closed the transabdominal fascia stiitch puncture sites with Steri-Strips.  We placed the catheter through the On-Q sheaths and peeled away and secured those with Steri-Strips and sterile dressings.  Patient is in the recovery room stable.  He wishes to go home later today.  We will see if that's possible versus overnight observation depending on his pain requirements.  Instructions are written.  I discussed him with him in the office and prior surgery.  About discussed with his family as well.

## 2012-02-24 NOTE — Progress Notes (Signed)
Foley catheter inserted (#14) and connected to leg bag. 500cc amber urine obtained. Patient instructed on  Catheter care and instructed on  how to remove it on Sunday

## 2012-02-24 NOTE — Progress Notes (Signed)
Pt still unable to void. 386cc shown on bladder scanner

## 2012-02-24 NOTE — Progress Notes (Signed)
Ambulated length of hallway, unable to void.  Back to bed tol well

## 2012-02-24 NOTE — Progress Notes (Signed)
Pt up often ambulating in hall tol well

## 2012-02-24 NOTE — H&P (Signed)
Cory Kemp  29-May-1941 [id number]  CARE TEAM:  PCP: [name], MD  Outpatient Care Team: Patient Care Team: Massie Maroon, MD as PCP - General (Internal Medicine)  Inpatient Treatment Team: Treatment Team: Attending Provider: Ardeth Sportsman, MD   This patient is a 71 y.o.male who presents today for surgical evaluation at the request of Dr. Selena Batten.   Reason for visit: Right inguinal hernia.  Incisional hernia  Patient is a pleasant smoking male. Still working and moderately active. 2 months ago he noticed a lump in his right groin. This gradually increased. Occasionally uncomfortable. He has intentionally lost 40 pounds over the past year. He switched to a low sugar diet. Because of concerns, and his wife mentioned to his primary care physician and sent the patient to [place] for surgical evaluation.   He likes to travel out [place]. [place] with [employer]. 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 He is quite active. Continues to smoke but has pretty good activity level.  Holding smoking for now.  No new events   Patient Active Problem List  Diagnosis  . Diabetes mellitus  . Inguinal hernia - right  . Incisional hernias - swiis cheeses type periumbilical    Past Medical History  Diagnosis Date  . Diabetes mellitus 01/10/2012  . Arthritis   . Hyperlipidemia   . Hypertension     Past Surgical History  Procedure Date  . Hemorrhoid surgery   . Tumor excision     History   Social History  . Marital Status: Single    Spouse Name: N/A    Number of Children: N/A  . Years of Education: N/A   Occupational History  . Not on file.   Social History Main Topics  . Smoking status: Current Everyday Smoker -- 1.5 packs/day for 40 years    Types: Cigarettes  . Smokeless tobacco: Not on file  . Alcohol Use: No  . Drug Use: No  . Sexually Active:    Other Topics Concern  . Not on file   Social History Narrative  . No narrative on file    History  reviewed. No pertinent family history.  Current Facility-Administered Medications  Medication Dose Route Frequency Provider Last Rate Last Dose  . ceFAZolin (ANCEF) IVPB 2 g/50 mL premix  2 g Intravenous 60 min Pre-Op Ardeth Sportsman, MD      . mupirocin ointment (BACTROBAN) 2 %            Facility-Administered Medications Ordered in Other Encounters  Medication Dose Route Frequency Provider Last Rate Last Dose  . lactated ringers infusion    Continuous PRN Elyn Peers, CRNA         No Known Allergies   BP 147/69  Pulse 90  Temp 97.6 F (36.4 C) (Oral)  Resp 18  SpO2 94%  Results:   Labs: Results for orders placed during the hospital encounter of 02/24/12 (from the past 48 hour(s))  GLUCOSE, CAPILLARY     Status: Abnormal   Collection Time   02/24/12  6:12 AM      Component Value Range Comment   Glucose-Capillary 116 (*) 70 - 99 mg/dL     Imaging / Studies: Dg Chest 2 View  02/20/2012  *RADIOLOGY REPORT*  Clinical Data: Preop.  CHEST - 2 VIEW  Comparison: 05/08/2010.  Findings: Trachea is midline.  Heart size normal.  Lungs are mildly hyperinflated but clear.  No pleural fluid.  IMPRESSION: No acute findings.  Original Report Authenticated By: Reyes Ivan, M.D.    Medications / Allergies: per chart  Antibiotics: Anti-infectives     Start     Dose/Rate Route Frequency Ordered Stop   02/24/12 0630   ceFAZolin (ANCEF) IVPB 2 g/50 mL premix        2 g 100 mL/hr over 30 Minutes Intravenous 60 min pre-op 02/24/12 0605           Review of Systems  Constitutional: Negative for fever, chills and diaphoresis.  HENT: Negative for hearing loss, nosebleeds, sore throat, facial swelling, mouth sores, trouble swallowing and ear discharge.  Eyes: Negative for photophobia, discharge and visual disturbance.  Respiratory: Negative for choking, chest tightness, shortness of breath and stridor.  Cardiovascular: Negative for chest pain and palpitations.  Patient walks 1.5  miles without difficulty. No exertional chest/neck/shoulder/arm pain.  Gastrointestinal: Negative for nausea, vomiting, abdominal pain, diarrhea, constipation, blood in stool, abdominal distention, anal bleeding and rectal pain.  No personal nor family history of GI/colon cancer, inflammatory bowel disease, irritable bowel syndrome, allergy such as Celiac Sprue, dietary/dairy problems, colitis, ulcers nor gastritis.  No recent sick contacts/gastroenteritis. No travel outside the country. No changes in diet.  BM daily  Genitourinary: Positive for difficulty urinating. Negative for dysuria, urgency, frequency and testicular pain.  Musculoskeletal: Negative for myalgias, back pain, arthralgias and gait problem.  Skin: Negative for color change, pallor, rash and wound.  Neurological: Negative for dizziness, speech difficulty, weakness, numbness and headaches.  Hematological: Negative for adenopathy. Does not bruise/bleed easily.  Psychiatric/Behavioral: Negative for hallucinations, confusion and agitation.    Objective:   Physical Exam  Constitutional: He is oriented to person, place, and time. He appears well-developed and well-nourished. No distress.  HENT:  Head: Normocephalic.  Mouth/Throat: Oropharynx is clear and moist. No oropharyngeal exudate.  Eyes: Conjunctivae and EOM are normal. Pupils are equal, round, and reactive to light. No scleral icterus.  Neck: Normal range of motion. Neck supple. No tracheal deviation present.  Cardiovascular: Normal rate, regular rhythm and intact distal pulses.  Pulmonary/Chest: Effort normal and breath sounds normal. No respiratory distress.  Abdominal: Soft. He exhibits no distension. There is no tenderness. Hernia confirmed in the right inguinal area and confirmed negative in the left inguinal area.  Periumbilical VWH Musculoskeletal: Normal range of motion. He exhibits no tenderness.  Lymphadenopathy:  He has no cervical adenopathy.  Right: No  inguinal adenopathy present.  Left: No inguinal adenopathy present.  Neurological: He is alert and oriented to person, place, and time. No cranial nerve deficit. He exhibits normal muscle tone. Coordination normal.  Skin: Skin is warm and dry. No rash noted. He is not diaphoretic. No erythema. No pallor.  Psychiatric: He has a normal mood and affect. His behavior is normal. Judgment and thought content normal.    Assessment:    RIH & periumbilical incisional VWH   Plan:    I think he would benefit from surgery to repair the hernias. Definitely the right inguinal one. He was interested in having the periumbilical hernia was repaired at the same time. He initially was hoping to get back to work and travel within a week. I cautioned him that he will need time to recover from this.   Eventually he should be able to get back to regular activity within reason. Did not push through pain now nor after surgery.   The anatomy & physiology of the abdominal wall and pelvic floor was discussed. The  pathophysiology of hernias in the inguinal and pelvic region was discussed. Natural history risks such as progressive enlargement, pain, incarceration & strangulation was discussed. Contributors to complications such as smoking, obesity, diabetes, prior surgery, etc were discussed.  I feel the risks of no intervention will lead to serious problems that outweigh the operative risks; therefore, I recommended surgery to reduce and repair the hernia. I explained laparoscopic techniques with possible need for an open approach. I noted usual use of mesh to patch and/or buttress hernia repair  Risks such as bleeding, infection, abscess, need for further treatment, heart attack, death, and other risks were discussed. I noted a good likelihood this will help address the problem. Goals of post-operative recovery were discussed as well. Possibility that this will not correct all symptoms was explained. I stressed the  importance of low-impact activity, aggressive pain control, avoiding constipation, & not pushing through pain to minimize risk of post-operative chronic pain or injury. Possibility of reherniation was discussed. We will work to minimize complications.  An educational handout further explaining the pathology & treatment options was given as well. Questions were answered. The patient & express understanding & wishes to proceed with surgery.   We talked to the patient about the dangers of smoking. We stressed that tobacco use dramatically increases the risk of peri-operative complications such as infection, tissue necrosis leaving to problems with incision/wound and organ healing, heart attack, stroke, DVT, pulmonary embolism, and death. We noted there are programs in our community to help stop smoking.   I have re-reviewed the the patient's records, history, medications, and allergies.  I have re-examined the patient.  I again discussed intraoperative plans and goals of post-operative recovery.  The patient agrees to proceed.   Ardeth Sportsman, M.D., F.A.C.S. Gastrointestinal and Minimally Invasive Surgery [facility] [address], [address] [place], [place] [address] [phone] Main / Paging [phone] Voice Mail   02/24/2012

## 2012-02-26 ENCOUNTER — Emergency Department (HOSPITAL_COMMUNITY)
Admission: EM | Admit: 2012-02-26 | Discharge: 2012-02-27 | Disposition: A | Discharge status: Home / Self Care | Payer: Medicare Other | Attending: Emergency Medicine | Admitting: Emergency Medicine

## 2012-02-26 ENCOUNTER — Encounter (HOSPITAL_COMMUNITY): Payer: Self-pay | Admitting: *Deleted

## 2012-02-26 DIAGNOSIS — Z9889 Other specified postprocedural states: Secondary | ICD-10-CM | POA: Insufficient documentation

## 2012-02-26 DIAGNOSIS — F17200 Nicotine dependence, unspecified, uncomplicated: Secondary | ICD-10-CM | POA: Insufficient documentation

## 2012-02-26 DIAGNOSIS — E119 Type 2 diabetes mellitus without complications: Secondary | ICD-10-CM | POA: Insufficient documentation

## 2012-02-26 DIAGNOSIS — I10 Essential (primary) hypertension: Secondary | ICD-10-CM | POA: Insufficient documentation

## 2012-02-26 DIAGNOSIS — Z79899 Other long term (current) drug therapy: Secondary | ICD-10-CM | POA: Insufficient documentation

## 2012-02-26 DIAGNOSIS — M129 Arthropathy, unspecified: Secondary | ICD-10-CM | POA: Insufficient documentation

## 2012-02-26 DIAGNOSIS — E785 Hyperlipidemia, unspecified: Secondary | ICD-10-CM | POA: Insufficient documentation

## 2012-02-26 DIAGNOSIS — T7840XA Allergy, unspecified, initial encounter: Secondary | ICD-10-CM

## 2012-02-26 MED ORDER — POLYETHYLENE GLYCOL 3350 17 G PO PACK
17.0000 g | PACK | Freq: Every day | ORAL | Status: DC
  Filled 2012-02-26: qty 1

## 2012-02-26 NOTE — ED Provider Notes (Signed)
History     CSN: [id number]  Arrival date & time 02/26/12  1256   First MD Initiated Contact with Patient 02/26/12 1543      Chief Complaint  Patient presents with  . Post-op Problem    blood blisters to abdomen, tape allergy    (Consider location/radiation/quality/duration/timing/severity/associated sxs/prior treatment) HPI Comments: Pt presents with complaints of an allergic reaction to the tegaderm dressings.  Pt is 2 days post op from inguinal/umbilical hernia repairs by Dr. Michaell Cowing.  Has multiple incisions on abdomen that are covered by tegaderm dressings.  He says that the tegaderm is irritating his skin and causing blisters.  Requests dressing change.  Otherwise pt feels like he is doing well.  His post-op pain is improving.  Denies fevers, n/v.  Does have some abdominal bloating, but says that he has not had a BM since surgery  The history is provided by the patient.    Past Medical History  Diagnosis Date  . Diabetes mellitus 01/10/2012  . Arthritis   . Hyperlipidemia   . Hypertension     Past Surgical History  Procedure Date  . Hemorrhoid surgery   . Tumor excision   . Hernia repair     History reviewed. No pertinent family history.  History  Substance Use Topics  . Smoking status: Current Everyday Smoker -- 1.5 packs/day for 40 years    Types: Cigarettes  . Smokeless tobacco: Not on file  . Alcohol Use: No      Review of Systems  Constitutional: Negative for fever, appetite change and fatigue.  HENT: Negative for congestion.   Respiratory: Negative for cough and shortness of breath.   Cardiovascular: Negative for chest pain.  Gastrointestinal: Negative for nausea and vomiting.  Genitourinary: Negative for difficulty urinating.  Musculoskeletal: Negative.   Skin: Positive for rash.  Psychiatric/Behavioral: Negative for confusion.    Allergies  Adhesive  Home Medications   Current Outpatient Rx  Name Route Sig Dispense Refill  . ALBUTEROL  SULFATE (2.5 MG/3ML) 0.083% IN NEBU Nebulization Take 2.5 mg by nebulization every 6 (six) hours as needed. Wheezing and shortness of breath    . VITAMIN D 1000 UNITS PO TABS Oral Take 2,000 Units by mouth daily.     [name] Kitchen FINASTERIDE 5 MG PO TABS Oral Take 5 mg by mouth daily with breakfast.     . LOSARTAN POTASSIUM 50 MG PO TABS Oral Take 50 mg by mouth daily with breakfast.     . METFORMIN HCL 500 MG PO TABS Oral Take 500 mg by mouth daily.     [name] Kitchen NAPROXEN SODIUM 220 MG PO TABS Oral Take 440 mg by mouth 2 (two) times daily with a meal.    . OXYCODONE HCL 5 MG PO TABS Oral Take 1-2 tablets (5-10 mg total) by mouth every 4 (four) hours as needed for pain. 50 tablet 0  . SIMVASTATIN 40 MG PO TABS Oral Take 40 mg by mouth at bedtime.     [name] Kitchen FLOMAX PO Oral Take 0.4 mg by mouth 3 (three) times a week.       BP 155/79  Pulse 73  Temp 97.8 F (36.6 C) (Oral)  Resp 16  Ht 6\' 1"  (1.854 m)  Wt 182 lb (82.555 kg)  BMI 24.01 kg/m2  SpO2 96%  Physical Exam  Constitutional: He is oriented to person, place, and time. He appears well-developed and well-nourished.  HENT:  Head: Normocephalic and atraumatic.  Eyes: Pupils are equal, round, and reactive to  light.  Neck: Normal range of motion. Neck supple.  Cardiovascular: Normal rate, regular rhythm and normal heart sounds.   Pulmonary/Chest: Effort normal and breath sounds normal. No respiratory distress. He has no wheezes. He has no rales. He exhibits no tenderness.  Abdominal: Soft. Bowel sounds are normal. He exhibits distension. There is tenderness (minimal diffuse tenderness). There is no rebound and no guarding.       Multiple small surgical incision to abdomen.  Covered with tegaderm dressing.  Incision sites look good, but pt has redness and some small blisters around edges of tegaderm.  Musculoskeletal: Normal range of motion. He exhibits no edema.  Lymphadenopathy:    He has no cervical adenopathy.  Neurological: He is alert and oriented to  person, place, and time.  Skin: Skin is warm and dry. No rash noted.  Psychiatric: He has a normal mood and affect.    ED Course  Procedures (including critical care time)  Labs Reviewed - No data to display No results found.   1. Allergic reaction       MDM  Will change all dressings over to gauze and paper tape.  Will give stool softener.  F/u with Dr. Michaell Cowing as directed        Rolan Bucco, MD 02/26/12 [phone]

## 2012-02-26 NOTE — ED Notes (Signed)
Patient is alert and oriented x3.  He is complaining of a reaction to tegaderm that has been applied to his incision.   The reaction started last night.  He denies any pain.

## 2012-02-26 NOTE — ED Notes (Signed)
Pt had abdominal surgery on 7/19 am by Dr. Michaell Cowing for hernia repair. Now abd is distended and having reaction to drsgs.

## 2012-02-26 NOTE — ED Notes (Signed)
Removed Tegaderm dressing from abdomen per Dr. Fredderick Phenix request. 2x2 dry guaze applied with paper tape. Patient toleraled well with no complaints.

## 2012-02-27 ENCOUNTER — Encounter (HOSPITAL_COMMUNITY): Payer: Self-pay | Admitting: Surgery

## 2012-02-27 ENCOUNTER — Telehealth (INDEPENDENT_AMBULATORY_CARE_PROVIDER_SITE_OTHER): Payer: Self-pay

## 2012-02-27 NOTE — Telephone Encounter (Signed)
The patient had a few questions.  He wanted to know if he runs out of Oxycodone can he get a refill.  I told him to call when he gets low and request it.  We may be able to refill it or we have a refill protocol where we can call in something less strong.  He called the EMS one day about his pain that was so bad.  He is taking Oxycodone and Aleve along with it.  Last night he went to the ER.  He had some blisters in reaction to the Tegaderm.  They put on a gauze dressing and paper tape.    He has an On-q pump and asked about how and when to remove that.  I asked did he not get any instructions?  I looked through papers and found the instructions.  He has an appointment with [place] on August 7th.   He will call for any further problems.

## 2012-03-01 ENCOUNTER — Telehealth (INDEPENDENT_AMBULATORY_CARE_PROVIDER_SITE_OTHER): Payer: Self-pay

## 2012-03-01 NOTE — Telephone Encounter (Signed)
The patient called requesting a refill of his Oxycodone.  I offered the refill protocol.  He agreed.  I called in Hydrocodone 5/325 one tab po q 4-6 hrs prn pain #30 no refills to [facility] on [address] [phone]

## 2012-03-06 ENCOUNTER — Telehealth (INDEPENDENT_AMBULATORY_CARE_PROVIDER_SITE_OTHER): Payer: Self-pay | Admitting: General Surgery

## 2012-03-06 NOTE — Telephone Encounter (Signed)
Pt called and said that he was fine with his PO appt being pushed out but he wanted to know if it was okay to leave the stitch in that is located at his navel.  He is s/p lap ventral hernia.  Advised that this would be okay.

## 2012-03-12 ENCOUNTER — Encounter (INDEPENDENT_AMBULATORY_CARE_PROVIDER_SITE_OTHER): Payer: Medicare Other | Admitting: Surgery

## 2012-03-13 ENCOUNTER — Encounter: Payer: Self-pay | Admitting: Gastroenterology

## 2012-03-14 ENCOUNTER — Encounter (INDEPENDENT_AMBULATORY_CARE_PROVIDER_SITE_OTHER): Payer: Medicare Other | Admitting: Surgery

## 2012-03-27 ENCOUNTER — Ambulatory Visit (INDEPENDENT_AMBULATORY_CARE_PROVIDER_SITE_OTHER): Payer: Medicare Other | Admitting: Surgery

## 2012-03-27 ENCOUNTER — Encounter (INDEPENDENT_AMBULATORY_CARE_PROVIDER_SITE_OTHER): Payer: Self-pay | Admitting: Surgery

## 2012-03-27 VITALS — BP 130/76 | HR 100 | Temp 97.8°F | Resp 16 | Ht 73.0 in | Wt 178.0 lb

## 2012-03-27 DIAGNOSIS — Z87891 Personal history of nicotine dependence: Secondary | ICD-10-CM | POA: Insufficient documentation

## 2012-03-27 DIAGNOSIS — F17200 Nicotine dependence, unspecified, uncomplicated: Secondary | ICD-10-CM

## 2012-03-27 DIAGNOSIS — K4090 Unilateral inguinal hernia, without obstruction or gangrene, not specified as recurrent: Secondary | ICD-10-CM

## 2012-03-27 DIAGNOSIS — K4190 Unilateral femoral hernia, without obstruction or gangrene, not specified as recurrent: Secondary | ICD-10-CM

## 2012-03-27 DIAGNOSIS — K432 Incisional hernia without obstruction or gangrene: Secondary | ICD-10-CM

## 2012-03-27 DIAGNOSIS — Z720 Tobacco use: Secondary | ICD-10-CM

## 2012-03-27 HISTORY — DX: Unilateral femoral hernia, without obstruction or gangrene, not specified as recurrent: K41.90

## 2012-03-27 NOTE — Progress Notes (Signed)
Subjective:     Patient ID: Cory Kemp, male   DOB: 06/02/1941, 71 y.o.   MRN: [id number]  HPI  Cory Kemp  1940/11/03 [id number]  Patient Care Team: Massie Maroon, MD as PCP - General (Internal Medicine)  This patient is a 71 y.o.male who presents today for surgical evaluation.   Procedure: Laparoscopic repair of periumbilical ventral wall hernias, right femoral hernia, right inguinal hernia.  02/24/2012  The patient comes in today with his wife.  Feeling better.  Had moderate bruising especially to his scrotum and inner thigh but that has nearly resolved.  Urinating fine.  Moving bowels well.  Did have a scary episode of noting blood on his dressings but resolved quickly after they were removed.  In good spirits.  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  Hoping to do more.  He's cut down from two to one pack per day of cigarettes.  Hoping to gradually quit.  Patient Active Problem List  Diagnosis  . Diabetes mellitus  . Tobacco abuse    Past Medical History  Diagnosis Date  . Diabetes mellitus 01/10/2012  . Arthritis   . Hyperlipidemia   . Hypertension   . Femoral hernia, right 03/27/2012  . Incisional hernias - swiss-cheese-type periumbilical 01/10/2012  . Inguinal hernia - right 01/10/2012    Past Surgical History  Procedure Date  . Hemorrhoid surgery   . Tumor excision   . Hernia repair   . Ventral hernia repair 02/24/2012    Procedure: LAPAROSCOPIC VENTRAL HERNIA;  Surgeon: Ardeth Sportsman, MD;  Location: [facility];  Service: General;  Laterality: N/A;  lysis of adhesions, excision of abdominal wall lipomae  . Inguinal hernia repair 02/24/2012    Procedure: LAPAROSCOPIC INGUINAL HERNIA;  Surgeon: Ardeth Sportsman, MD;  Location: [facility];  Service: General;  Laterality: N/A;    History   Social History  . Marital Status: Single    Spouse Name: N/A    Number of Children: N/A  . Years of Education: N/A   Occupational History   . Not on file.   Social History Main Topics  . Smoking status: Current Everyday Smoker -- 1.5 packs/day for 40 years    Types: Cigarettes  . Smokeless tobacco: Not on file  . Alcohol Use: No  . Drug Use: No  . Sexually Active: Not on file   Other Topics Concern  . Not on file   Social History Narrative  . No narrative on file    History reviewed. No pertinent family history.  Current Outpatient Prescriptions  Medication Sig Dispense Refill  . albuterol (PROVENTIL) (2.5 MG/3ML) 0.083% nebulizer solution Take 2.5 mg by nebulization every 6 (six) hours as needed. Wheezing and shortness of breath      . cholecalciferol (VITAMIN D) 1000 UNITS tablet Take 2,000 Units by mouth daily.       . finasteride (PROSCAR) 5 MG tablet Take 5 mg by mouth daily with breakfast.       . HYDROcodone-acetaminophen (NORCO/VICODIN) 5-325 MG per tablet       . losartan (COZAAR) 50 MG tablet Take 50 mg by mouth daily with breakfast.       . metFORMIN (GLUCOPHAGE) 500 MG tablet Take 500 mg by mouth daily.       . naproxen sodium (ANAPROX) 220 MG tablet Take 440 mg by mouth 2 (two) times daily with a meal.      .  oxyCODONE (OXY IR/ROXICODONE) 5 MG immediate release tablet       . simvastatin (ZOCOR) 40 MG tablet Take 40 mg by mouth at bedtime.       . Tamsulosin HCl (FLOMAX PO) Take 0.4 mg by mouth 3 (three) times a week.          Allergies  Allergen Reactions  . Adhesive (Tape) Rash    BP 130/76  Pulse 100  Temp 97.8 F (36.6 C) (Temporal)  Resp 16  Ht 6\' 1"  (1.854 m)  Wt 178 lb (80.74 kg)  BMI 23.48 kg/m2  No results found.   Review of Systems  Constitutional: Negative for fever, chills and diaphoresis.  HENT: Negative for sore throat, trouble swallowing and neck pain.   Eyes: Negative for photophobia and visual disturbance.  Respiratory: Negative for choking and shortness of breath.   Cardiovascular: Negative for chest pain and palpitations.  Gastrointestinal: Negative for nausea,  vomiting, abdominal distention, anal bleeding and rectal pain.  Genitourinary: Negative for dysuria, urgency, difficulty urinating and testicular pain.  Musculoskeletal: Negative for myalgias, arthralgias and gait problem.  Skin: Negative for color change and rash.  Neurological: Negative for dizziness, speech difficulty, weakness and numbness.  Hematological: Negative for adenopathy.  Psychiatric/Behavioral: Negative for hallucinations, confusion and agitation.       Objective:   Physical Exam  Constitutional: He is oriented to person, place, and time. He appears well-developed and well-nourished. No distress.  HENT:  Head: Normocephalic.  Mouth/Throat: Oropharynx is clear and moist. No oropharyngeal exudate.  Eyes: Conjunctivae and EOM are normal. Pupils are equal, round, and reactive to light. No scleral icterus.  Neck: Normal range of motion. No tracheal deviation present.  Cardiovascular: Normal rate, normal heart sounds and intact distal pulses.   Pulmonary/Chest: Effort normal. No respiratory distress.  Abdominal: Soft. He exhibits no distension and no mass. There is no tenderness. There is no guarding. Hernia confirmed negative in the right inguinal area and confirmed negative in the left inguinal area.       Incisions clean with normal healing ridges.  No hernias  Genitourinary: Penis normal. No penile tenderness.  Musculoskeletal: Normal range of motion. He exhibits no tenderness.  Neurological: He is alert and oriented to person, place, and time. No cranial nerve deficit. He exhibits normal muscle tone. Coordination normal.  Skin: Skin is warm and dry. No rash noted. He is not diaphoretic.  Psychiatric: He has a normal mood and affect. His behavior is normal.       Assessment:     Recovering well from lap VWH & RIH/RFH hernia    Plan:     Increase activity as tolerated.  Do not push through pain.  Advanced on diet as tolerated. Bowel regimen to avoid  problems.  Return to clinic p.r.n. The patient expressed understanding and appreciation

## 2012-03-27 NOTE — Patient Instructions (Signed)
HERNIA REPAIR: POST OP INSTRUCTIONS  1. DIET: Follow a light bland diet the first 24 hours after arrival home, such as soup, liquids, crackers, etc.  Be sure to include lots of fluids daily.  Avoid fast food or heavy meals as your are more likely to get nauseated.  Eat a low fat the next few days after surgery. 2. Take your usually prescribed home medications unless otherwise directed. 3. PAIN CONTROL: a. Pain is best controlled by a usual combination of three different methods TOGETHER: i. Ice/Heat ii. Over the counter pain medication iii. Prescription pain medication b. Most patients will experience some swelling and bruising around the hernia(s) such as the bellybutton, groins, or old incisions.  Ice packs or heating pads (30-60 minutes up to 6 times a day) will help. Use ice for the first few days to help decrease swelling and bruising, then switch to heat to help relax tight/sore spots and speed recovery.  Some people prefer to use ice alone, heat alone, alternating between ice & heat.  Experiment to what works for you.  Swelling and bruising can take several weeks to resolve.   c. It is helpful to take an over-the-counter pain medication regularly for the first few weeks.  Choose one of the following that works best for you: i. Naproxen (Aleve, etc)  Two 220mg tabs twice a day ii. Ibuprofen (Advil, etc) Three 200mg tabs four times a day (every meal & bedtime) iii. Acetaminophen (Tylenol, etc) 325-650mg four times a day (every meal & bedtime) d. A  prescription for pain medication should be given to you upon discharge.  Take your pain medication as prescribed.  i. If you are having problems/concerns with the prescription medicine (does not control pain, nausea, vomiting, rash, itching, etc), please call us [phone] to see if we need to switch you to a different pain medicine that will work better for you and/or control your side effect better. ii. If you need a refill on your pain  medication, please contact your pharmacy.  They will contact our office to request authorization. Prescriptions will not be filled after 5 pm or on week-ends. 4. Avoid getting constipated.  Between the surgery and the pain medications, it is common to experience some constipation.  Increasing fluid intake and taking a fiber supplement (such as Metamucil, Citrucel, FiberCon, MiraLax, etc) 1-2 times a day regularly will usually help prevent this problem from occurring.  A mild laxative (prune juice, Milk of Magnesia, MiraLax, etc) should be taken according to package directions if there are no bowel movements after 48 hours.   5. Wash / shower every day.  You may shower over the dressings as they are waterproof.   6. Remove your waterproof bandages 5 days after surgery.  You may leave the incision open to air.  You may replace a dressing/Band-Aid to cover the incision for comfort if you wish.  Continue to shower over incision(s) after the dressing is off.    7. ACTIVITIES as tolerated:   a. You may resume regular (light) daily activities beginning the next day-such as daily self-care, walking, climbing stairs-gradually increasing activities as tolerated.  If you can walk 30 minutes without difficulty, it is safe to try more intense activity such as jogging, treadmill, bicycling, low-impact aerobics, swimming, etc. b. Save the most intensive and strenuous activity for last such as sit-ups, heavy lifting, contact sports, etc  Refrain from any heavy lifting or straining until you are off narcotics for pain control.     c. DO NOT PUSH THROUGH PAIN.  Let pain be your guide: If it hurts to do something, don't do it.  Pain is your body warning you to avoid that activity for another week until the pain goes down. d. You may drive when you are no longer taking prescription pain medication, you can comfortably wear a seatbelt, and you can safely maneuver your car and apply brakes. e. You may have sexual intercourse  when it is comfortable.  8. FOLLOW UP in our office a. Please call [facility] at [phone] to set up an appointment to see your surgeon in the office for a follow-up appointment approximately 2-3 weeks after your surgery. b. Make sure that you call for this appointment the day you arrive home to insure a convenient appointment time. 9.  IF YOU HAVE DISABILITY OR FAMILY LEAVE FORMS, BRING THEM TO THE OFFICE FOR PROCESSING.  DO NOT GIVE THEM TO YOUR DOCTOR.  WHEN TO CALL US [phone]: 1. Poor pain control 2. Reactions / problems with new medications (rash/itching, nausea, etc)  3. Fever over 101.5 F (38.5 C) 4. Inability to urinate 5. Nausea and/or vomiting 6. Worsening swelling or bruising 7. Continued bleeding from incision. 8. Increased pain, redness, or drainage from the incision   The clinic staff is available to answer your questions during regular business hours (8:30am-5pm).  Please don't hesitate to call and ask to speak to one of our nurses for clinical concerns.   If you have a medical emergency, go to the nearest emergency room or call 911.  A surgeon from [facility] is always on call at the hospitals in [place]  [facility] [address], [address], [place], [place]  [address] ?  [address] [address], [place], [place]   [address] MAIN: [phone] ? TOLL FREE: [phone] ? FAX: [phone] [email or web address]  

## 2012-04-13 ENCOUNTER — Other Ambulatory Visit (INDEPENDENT_AMBULATORY_CARE_PROVIDER_SITE_OTHER): Payer: Medicare Other

## 2012-04-13 ENCOUNTER — Ambulatory Visit (INDEPENDENT_AMBULATORY_CARE_PROVIDER_SITE_OTHER): Payer: Medicare Other | Admitting: Gastroenterology

## 2012-04-13 ENCOUNTER — Encounter: Payer: Self-pay | Admitting: Gastroenterology

## 2012-04-13 VITALS — BP 140/70 | HR 104 | Ht 72.5 in | Wt 180.2 lb

## 2012-04-13 DIAGNOSIS — R6889 Other general symptoms and signs: Secondary | ICD-10-CM

## 2012-04-13 DIAGNOSIS — Z1211 Encounter for screening for malignant neoplasm of colon: Secondary | ICD-10-CM

## 2012-04-13 DIAGNOSIS — D649 Anemia, unspecified: Secondary | ICD-10-CM

## 2012-04-13 LAB — IBC PANEL
Iron: 88 ug/dL (ref 42–165)
Saturation Ratios: 26.1 % (ref 20.0–50.0)
Transferrin: 241.2 mg/dL (ref 212.0–360.0)

## 2012-04-13 MED ORDER — MOVIPREP 100 G PO SOLR: 1.0000 | Freq: Once | ORAL | Status: DC

## 2012-04-13 NOTE — Progress Notes (Signed)
History of Present Illness: This is a 71 year old male here today with his wife. He was found to have a mild normocytic anemia with a hemoglobin of 13.0, normal range 13.5-18.0. He has no gastrointestinal complaints. He states that he was diagnosed with diabetes last year and changed his diet substantially and lost about 45 or 50 pounds. His weight has been stable for several months on his new diet. He has never had a colonoscopy. Denies abdominal pain, constipation, diarrhea, change in stool caliber, melena, hematochezia, nausea, vomiting, dysphagia, reflux symptoms, chest pain.  Allergies  Allergen Reactions  . Adhesive (Tape) Rash   Outpatient Prescriptions Prior to Visit  Medication Sig Dispense Refill  . albuterol (PROVENTIL) (2.5 MG/3ML) 0.083% nebulizer solution Take 2.5 mg by nebulization every 6 (six) hours as needed. Wheezing and shortness of breath      . cholecalciferol (VITAMIN D) 1000 UNITS tablet Take 2,000 Units by mouth daily.       [name] Kitchen losartan (COZAAR) 50 MG tablet Take 50 mg by mouth daily with breakfast.       . metFORMIN (GLUCOPHAGE) 500 MG tablet Take 500 mg by mouth daily.       . simvastatin (ZOCOR) 40 MG tablet Take 40 mg by mouth at bedtime.       . finasteride (PROSCAR) 5 MG tablet Take 5 mg by mouth daily with breakfast.       . HYDROcodone-acetaminophen (NORCO/VICODIN) 5-325 MG per tablet       . naproxen sodium (ANAPROX) 220 MG tablet Take 440 mg by mouth 2 (two) times daily with a meal.      . oxyCODONE (OXY IR/ROXICODONE) 5 MG immediate release tablet       . Tamsulosin HCl (FLOMAX PO) Take 0.4 mg by mouth 3 (three) times a week.        Past Medical History  Diagnosis Date  . Diabetes mellitus 01/10/2012  . Arthritis   . Hyperlipidemia   . Hypertension   . Femoral hernia, right 03/27/2012  . Incisional hernias - swiss-cheese-type periumbilical 01/10/2012  . Inguinal hernia - right 01/10/2012  . Glucose intolerance (impaired glucose tolerance)   . COPD (chronic  obstructive pulmonary disease)   . Dementia     ?  [name] Kitchen Osteopenia   . Anemia   . Alcohol abuse   . Asthma    Past Surgical History  Procedure Date  . Hemorrhoid surgery   . Tumor excision     intestinal after SBO ( Benign), appendectomy  . Hernia repair   . Ventral hernia repair 02/24/2012    Procedure: LAPAROSCOPIC VENTRAL HERNIA;  Surgeon: Ardeth Sportsman, MD;  Location: [facility];  Service: General;  Laterality: N/A;  lysis of adhesions, excision of abdominal wall lipomae  . Inguinal hernia repair 02/24/2012    Procedure: LAPAROSCOPIC INGUINAL HERNIA;  Surgeon: Ardeth Sportsman, MD;  Location: [facility];  Service: General;  Laterality: N/A;   History   Social History  . Marital Status: Single    Spouse Name: N/A    Number of Children: 2  . Years of Education: N/A   Occupational History  . Retired     Archivist   Social History Main Topics  . Smoking status: Current Everyday Smoker -- 1.5 packs/day for 40 years    Types: Cigarettes  . Smokeless tobacco: Never Used  . Alcohol Use: No  . Drug Use: No  . Sexually Active: None   Other Topics Concern  . None   Social  History Narrative  . None   Family History  Problem Relation Age of Onset  . Lung cancer Father   . Lung cancer Mother   . Diabetes Brother    Review of Systems: Pertinent positive and negative review of systems were noted in the above HPI section. All other review of systems were otherwise negative.  Current Medications, Allergies, Past Medical History, Past Surgical History, Family History and Social History were reviewed in [employer] record.  Physical Exam: General: Well developed , well nourished, no acute distress Head: Normocephalic and atraumatic Eyes:  sclerae anicteric, EOMI Ears: Normal auditory acuity Mouth: No deformity or lesions Neck: Supple, no masses or thyromegaly Lungs: Clear throughout to auscultation Heart: Regular rate and rhythm; no murmurs, rubs or  bruits Abdomen: Soft, non tender and non distended. No masses, hepatosplenomegaly or hernias noted. Normal Bowel sounds Rectal: deferred to colonoscopy Musculoskeletal: Symmetrical with no gross deformities  Skin: No lesions on visible extremities Pulses:  Normal pulses noted Extremities: No clubbing, cyanosis, edema or deformities noted Neurological: Alert oriented x 4, grossly nonfocal Cervical Nodes:  No significant cervical adenopathy Inguinal Nodes: No significant inguinal adenopathy Psychological:  Alert and cooperative. Normal mood and affect  Assessment and Recommendations:  1. Mild normocytic anemia. Further evaluation for iron deficiency anemia and other causes of anemia. Obain stool Hemoccults. If he is iron deficient or Hemoccults are positive, further evaluation of the gastrointestinal tract would be indicated.  2. Colorectal cancer screening, average risk. The risks, benefits, and alternatives to colonoscopy with possible biopsy and possible polypectomy were discussed with the patient and they consent to proceed.

## 2012-04-13 NOTE — Patient Instructions (Addendum)
Your physician has requested that you go to the basement for the following lab work before leaving today:Iron studies.   You have been given a separate informational sheet regarding your tobacco use, the importance of quitting and local resources to help you quit.  You have been scheduled for a colonoscopy with propofol. Please follow written instructions given to you at your visit today.  Please pick up your prep kit at the pharmacy within the next 1-3 days. If you use inhalers (even only as needed), please bring them with you on the day of your procedure.  Please follow instructions on Hemoccult cards and mail back to [place] when finished.  cc: [name], MD

## 2012-04-17 ENCOUNTER — Encounter (HOSPITAL_COMMUNITY): Payer: Self-pay | Admitting: *Deleted

## 2012-04-17 ENCOUNTER — Emergency Department (HOSPITAL_COMMUNITY)
Admission: EM | Admit: 2012-04-17 | Discharge: 2012-04-17 | Disposition: A | Discharge status: Home / Self Care | Payer: Medicare Other | Attending: Emergency Medicine | Admitting: Emergency Medicine

## 2012-04-17 DIAGNOSIS — M899 Disorder of bone, unspecified: Secondary | ICD-10-CM | POA: Insufficient documentation

## 2012-04-17 DIAGNOSIS — M949 Disorder of cartilage, unspecified: Secondary | ICD-10-CM | POA: Insufficient documentation

## 2012-04-17 DIAGNOSIS — E119 Type 2 diabetes mellitus without complications: Secondary | ICD-10-CM | POA: Insufficient documentation

## 2012-04-17 DIAGNOSIS — F17200 Nicotine dependence, unspecified, uncomplicated: Secondary | ICD-10-CM | POA: Insufficient documentation

## 2012-04-17 DIAGNOSIS — I10 Essential (primary) hypertension: Secondary | ICD-10-CM | POA: Insufficient documentation

## 2012-04-17 DIAGNOSIS — J45909 Unspecified asthma, uncomplicated: Secondary | ICD-10-CM | POA: Insufficient documentation

## 2012-04-17 DIAGNOSIS — N139 Obstructive and reflux uropathy, unspecified: Secondary | ICD-10-CM

## 2012-04-17 DIAGNOSIS — Z801 Family history of malignant neoplasm of trachea, bronchus and lung: Secondary | ICD-10-CM | POA: Insufficient documentation

## 2012-04-17 DIAGNOSIS — Z833 Family history of diabetes mellitus: Secondary | ICD-10-CM | POA: Insufficient documentation

## 2012-04-17 LAB — URINALYSIS, ROUTINE W REFLEX MICROSCOPIC
Bilirubin Urine: NEGATIVE
Glucose, UA: NEGATIVE mg/dL
Ketones, ur: NEGATIVE mg/dL
Leukocytes, UA: NEGATIVE
pH: 6 (ref 5.0–8.0)

## 2012-04-17 LAB — URINE MICROSCOPIC-ADD ON

## 2012-04-17 MED ORDER — TAMSULOSIN HCL 0.4 MG PO CAPS: 0.4000 mg | ORAL_CAPSULE | Freq: Every day | ORAL | Status: DC

## 2012-04-17 NOTE — ED Notes (Addendum)
Last void yesterday at ~ 2300. C/o bladder pain, unable to void. Acuity set as 2 d/t pain. Restless, pacing.

## 2012-04-18 LAB — URINE CULTURE: Colony Count: NO GROWTH

## 2012-04-18 NOTE — ED Provider Notes (Signed)
History     CSN: [id number]  Arrival date & time 04/17/12  0534   First MD Initiated Contact with Patient 04/17/12 [phone]      Chief Complaint  Patient presents with  . Urinary Retention  . Abdominal Pain    (Consider location/radiation/quality/duration/timing/severity/associated sxs/prior treatment) HPI Comments: The patient is a 71 year old male who complains of difficulty urinating. The patient states that he does have a history of prostatic hypertrophy and has required urinary catheter placed in the past. He states that his last void was approximately 11:00 last night, he has been out of his Flomax. He denies fevers chills nausea vomiting but does have lower abdominal pain associated with urinary retention. The symptoms are persistent, nothing makes better or worse  Patient is a 71 y.o. male presenting with abdominal pain. The history is provided by the patient, the spouse and medical records.  Abdominal Pain The primary symptoms of the illness include abdominal pain. The primary symptoms of the illness do not include fever, nausea or vomiting.    Past Medical History  Diagnosis Date  . Diabetes mellitus 01/10/2012  . Arthritis   . Hyperlipidemia   . Hypertension   . Femoral hernia, right 03/27/2012  . Incisional hernias - swiss-cheese-type periumbilical 01/10/2012  . Inguinal hernia - right 01/10/2012  . Glucose intolerance (impaired glucose tolerance)   . COPD (chronic obstructive pulmonary disease)   . Dementia     ?  [name] Kitchen Osteopenia   . Anemia   . Alcohol abuse   . Asthma     Past Surgical History  Procedure Date  . Hemorrhoid surgery   . Tumor excision     intestinal after SBO ( Benign), appendectomy  . Hernia repair   . Ventral hernia repair 02/24/2012    Procedure: LAPAROSCOPIC VENTRAL HERNIA;  Surgeon: Ardeth Sportsman, MD;  Location: [facility];  Service: General;  Laterality: N/A;  lysis of adhesions, excision of abdominal wall lipomae  . Inguinal hernia repair 02/24/2012     Procedure: LAPAROSCOPIC INGUINAL HERNIA;  Surgeon: Ardeth Sportsman, MD;  Location: [facility];  Service: General;  Laterality: N/A;    Family History  Problem Relation Age of Onset  . Lung cancer Father   . Lung cancer Mother   . Diabetes Brother     History  Substance Use Topics  . Smoking status: Current Every Day Smoker -- 1.5 packs/day for 40 years    Types: Cigarettes  . Smokeless tobacco: Never Used  . Alcohol Use: No      Review of Systems  Constitutional: Negative for fever.  Gastrointestinal: Positive for abdominal pain. Negative for nausea and vomiting.  Genitourinary: Positive for difficulty urinating.  Skin: Negative for rash.    Allergies  Adhesive  Home Medications   Current Outpatient Rx  Name Route Sig Dispense Refill  . ALBUTEROL SULFATE (2.5 MG/3ML) 0.083% IN NEBU Nebulization Take 2.5 mg by nebulization every 6 (six) hours as needed. Wheezing and shortness of breath    . VITAMIN D 1000 UNITS PO TABS Oral Take 2,000 Units by mouth daily.     [name] Kitchen LOSARTAN POTASSIUM 50 MG PO TABS Oral Take 50 mg by mouth daily with breakfast.     . METFORMIN HCL 500 MG PO TABS Oral Take 500 mg by mouth daily.     [name] Kitchen NAPROXEN SODIUM 220 MG PO CAPS Oral Take 220 mg by mouth daily as needed. For pain    . SIMVASTATIN 40 MG PO TABS Oral Take  40 mg by mouth at bedtime.     . TAMSULOSIN HCL 0.4 MG PO CAPS Oral Take 1 capsule (0.4 mg total) by mouth daily. 30 capsule 0    BP 195/94  Pulse 103  Temp 96.9 F (36.1 C) (Oral)  Resp 16  SpO2 98%  Physical Exam  Nursing note and vitals reviewed. Constitutional:       Uncomfortable appearing, pacing the room  HENT:  Head: Normocephalic and atraumatic.  Eyes: Conjunctivae normal are normal. No scleral icterus.  Cardiovascular: Normal rate and regular rhythm.   No murmur heard. Pulmonary/Chest: Effort normal and breath sounds normal. No respiratory distress.  Abdominal: Soft. He exhibits no distension. There is tenderness  (suprapubic tenderness).  Genitourinary:       Normal appearing testicles and scrotum, no hernias present  Musculoskeletal: Normal range of motion. He exhibits no tenderness.  Neurological: He is alert. Coordination normal.       Normal gait, normal speech  Skin: Skin is warm and dry. No rash noted.    ED Course  Procedures (including critical care time)  Labs Reviewed  URINALYSIS, ROUTINE W REFLEX MICROSCOPIC - Abnormal; Notable for the following:    Hgb urine dipstick TRACE (*)     All other components within normal limits  URINE MICROSCOPIC-ADD ON  URINE CULTURE   No results found.   1. Urinary obstruction       MDM  Urinary retention present, Foley catheter placed and bladder drained with complete relief of symptoms. Urinalysis reviewed showing no signs of urinary infection or hematuria, specific gravity in a normal range, no ketones. Leg bag given for Foley catheter, patient reminded of indications for return and urology followup recommended. Patient stable for discharge        Vida Roller, MD 04/18/12 [phone]

## 2012-05-03 ENCOUNTER — Other Ambulatory Visit (INDEPENDENT_AMBULATORY_CARE_PROVIDER_SITE_OTHER): Payer: Medicare Other

## 2012-05-03 DIAGNOSIS — D649 Anemia, unspecified: Secondary | ICD-10-CM

## 2012-05-03 DIAGNOSIS — Z1211 Encounter for screening for malignant neoplasm of colon: Secondary | ICD-10-CM

## 2012-05-03 LAB — HEMOCCULT SLIDES (X 3 CARDS)
OCCULT 1: NEGATIVE
OCCULT 2: NEGATIVE
OCCULT 4: NEGATIVE
OCCULT 5: NEGATIVE

## 2012-05-08 ENCOUNTER — Ambulatory Visit (AMBULATORY_SURGERY_CENTER): Payer: Medicare Other | Admitting: Gastroenterology

## 2012-05-08 ENCOUNTER — Encounter: Payer: Self-pay | Admitting: Gastroenterology

## 2012-05-08 VITALS — BP 126/76 | HR 96 | Temp 98.1°F | Resp 16 | Ht 73.0 in | Wt 180.0 lb

## 2012-05-08 DIAGNOSIS — Z1211 Encounter for screening for malignant neoplasm of colon: Secondary | ICD-10-CM

## 2012-05-08 LAB — GLUCOSE, CAPILLARY
Glucose-Capillary: 72 mg/dL (ref 70–99)
Glucose-Capillary: 115 mg/dL — ABNORMAL HIGH (ref 70–99)

## 2012-05-08 MED ORDER — SODIUM CHLORIDE 0.9 % IV SOLN: 500.0000 mL | INTRAVENOUS | Status: DC

## 2012-05-08 NOTE — Patient Instructions (Addendum)
YOU HAD AN ENDOSCOPIC PROCEDURE TODAY AT THE [facility]: Refer to the procedure report that was given to you for any specific questions about what was found during the examination.  If the procedure report does not answer your questions, please call your gastroenterologist to clarify.  If you requested that your care partner not be given the details of your procedure findings, then the procedure report has been included in a sealed envelope for you to review at your convenience later.  YOU SHOULD EXPECT: Some feelings of bloating in the abdomen. Passage of more gas than usual.  Walking can help get rid of the air that was put into your GI tract during the procedure and reduce the bloating. If you had a lower endoscopy (such as a colonoscopy or flexible sigmoidoscopy) you may notice spotting of blood in your stool or on the toilet paper. If you underwent a bowel prep for your procedure, then you may not have a normal bowel movement for a few days.  DIET: Your first meal following the procedure should be a light meal and then it is ok to progress to your normal diet.  A half-sandwich or bowl of soup is an example of a good first meal.  Heavy or fried foods are harder to digest and may make you feel nauseous or bloated.  Likewise meals heavy in dairy and vegetables can cause extra gas to form and this can also increase the bloating.  Drink plenty of fluids but you should avoid alcoholic beverages for 24 hours.  ACTIVITY: Your care partner should take you home directly after the procedure.  You should plan to take it easy, moving slowly for the rest of the day.  You can resume normal activity the day after the procedure however you should NOT DRIVE or use heavy machinery for 24 hours (because of the sedation medicines used during the test).    SYMPTOMS TO REPORT IMMEDIATELY: A gastroenterologist can be reached at any hour.  During normal business hours, 8:30 AM to 5:00 PM Monday through Friday,  call [phone].  After hours and on weekends, please call the GI answering service at [phone] who will take a message and have the physician on call contact you.   Following lower endoscopy (colonoscopy or flexible sigmoidoscopy):  Excessive amounts of blood in the stool  Significant tenderness or worsening of abdominal pains  Swelling of the abdomen that is new, acute  Fever of 100F or higher  FOLLOW UP: If any biopsies were taken you will be contacted by phone or by letter within the next 1-3 weeks.  Call your gastroenterologist if you have not heard about the biopsies in 3 weeks.  Our staff will call the home number listed on your records the next business day following your procedure to check on you and address any questions or concerns that you may have at that time regarding the information given to you following your procedure. This is a courtesy call and so if there is no answer at the home number and we have not heard from you through the emergency physician on call, we will assume that you have returned to your regular daily activities without incident.  SIGNATURES/CONFIDENTIALITY: You and/or your care partner have signed paperwork which will be entered into your electronic medical record.  These signatures attest to the fact that that the information above on your After Visit Summary has been reviewed and is understood.  Full responsibility of the confidentiality of this   discharge information lies with you and/or your care-partner.   Thank-you for choosing us for your medical care. 

## 2012-05-08 NOTE — Op Note (Signed)
[facility] [address] [place], [address]   COLONOSCOPY PROCEDURE REPORT  PATIENT: Cory, Kemp  MR#: [id number] BIRTHDATE: 1940/08/23 , 71  yrs. old GENDER: Male ENDOSCOPIST: Meryl Dare, MD, [facility] REFERRED XB:JYNWG Kim, M.D. PROCEDURE DATE:  05/08/2012 PROCEDURE:   Colonoscopy, screening ASA CLASS:   Class II INDICATIONS:average risk screening. MEDICATIONS: MAC sedation, administered by CRNA and propofol (Diprivan) 100mg  IV  DESCRIPTION OF PROCEDURE:   After the risks benefits and alternatives of the procedure were thoroughly explained, informed consent was obtained.  A digital rectal exam revealed no abnormalities of the rectum.   The LB [id number]  endoscope was introduced through the anus and advanced to the cecum, which was identified by both the appendix and ileocecal valve. No adverse events experienced.   The quality of the prep was good, using MoviPrep  The instrument was then slowly withdrawn as the colon was fully examined.   COLON FINDINGS: A normal appearing cecum, ileocecal valve, and appendiceal orifice were identified.  The ascending, hepatic flexure, transverse, splenic flexure, descending, sigmoid colon and rectum appeared unremarkable.  No polyps or cancers were seen. Retroflexed views revealed moderate internal hemorrhoids. The time to cecum=3 minutes 37 seconds.  Withdrawal time=8 minutes 08 seconds.  The scope was withdrawn and the procedure completed.  COMPLICATIONS: There were no complications.  ENDOSCOPIC IMPRESSION: 1.  Normal colon 2.  Moderate internal hemorrhoids  RECOMMENDATIONS: 1.   Given your age, you will not need another colonoscopy for colon cancer screening or polyp surveillance.  These types of tests usually stop around the age 63.   eSigned:  Meryl Dare, MD, [facility] 05/08/2012 1:55 PM

## 2012-05-08 NOTE — Progress Notes (Signed)
Patient did not have preoperative order for IV antibiotic SSI prophylaxis. ([id number])   

## 2012-05-09 ENCOUNTER — Telehealth: Payer: Self-pay | Admitting: *Deleted

## 2012-05-09 NOTE — Telephone Encounter (Signed)
  Follow up Call-  Call back number 05/08/2012  Post procedure Call Back phone  # [phone]  Permission to leave phone message Yes     Patient questions:  Do you have a fever, pain , or abdominal swelling? no Pain Score  0 *  Have you tolerated food without any problems? yes  Have you been able to return to your normal activities? yes  Do you have any questions about your discharge instructions: Diet   no Medications  no Follow up visit  no  Do you have questions or concerns about your Care? no  Actions: * If pain score is 4 or above: No action needed, pain <4.

## 2013-01-11 ENCOUNTER — Other Ambulatory Visit: Payer: Self-pay | Admitting: Internal Medicine

## 2013-01-11 ENCOUNTER — Telehealth: Payer: Self-pay | Admitting: Oncology

## 2013-01-11 DIAGNOSIS — R3129 Other microscopic hematuria: Secondary | ICD-10-CM

## 2013-01-11 DIAGNOSIS — R634 Abnormal weight loss: Secondary | ICD-10-CM

## 2013-01-11 NOTE — Telephone Encounter (Signed)
S/W PT IN RE NP APPT 07/23 @ 9:30 W/DR. GRANFOTUNA REFERRING DR. Selena Batten DX- LEUKOPENIA MONOCYTOSIS MILD ANEMIA WELCOME PACKET MAILED.

## 2013-01-11 NOTE — Telephone Encounter (Signed)
C/D 01/11/13 for appt. 02/27/13 °

## 2013-01-18 ENCOUNTER — Ambulatory Visit
Admission: RE | Admit: 2013-01-18 | Discharge: 2013-01-18 | Disposition: A | Discharge status: Home / Self Care | Payer: Medicare Other | Source: Ambulatory Visit | Attending: Internal Medicine | Admitting: Internal Medicine

## 2013-01-18 DIAGNOSIS — R634 Abnormal weight loss: Secondary | ICD-10-CM

## 2013-01-18 DIAGNOSIS — R3129 Other microscopic hematuria: Secondary | ICD-10-CM

## 2013-01-18 MED ORDER — IOHEXOL 300 MG/ML  SOLN
125.0000 mL | Freq: Once | INTRAMUSCULAR | Status: AC | PRN
  Administered 2013-01-18: 125 mL via INTRAVENOUS

## 2013-01-29 ENCOUNTER — Encounter: Payer: Self-pay | Admitting: Oncology

## 2013-01-29 ENCOUNTER — Other Ambulatory Visit: Payer: Self-pay | Admitting: Oncology

## 2013-01-29 DIAGNOSIS — D649 Anemia, unspecified: Secondary | ICD-10-CM | POA: Insufficient documentation

## 2013-01-29 DIAGNOSIS — D72819 Decreased white blood cell count, unspecified: Secondary | ICD-10-CM

## 2013-01-29 DIAGNOSIS — D519 Vitamin B12 deficiency anemia, unspecified: Secondary | ICD-10-CM

## 2013-01-29 DIAGNOSIS — D72821 Monocytosis (symptomatic): Secondary | ICD-10-CM | POA: Insufficient documentation

## 2013-01-29 HISTORY — DX: Anemia, unspecified: D64.9

## 2013-01-29 HISTORY — DX: Monocytosis (symptomatic): D72.821

## 2013-01-29 HISTORY — DX: Decreased white blood cell count, unspecified: D72.819

## 2013-01-30 ENCOUNTER — Telehealth: Payer: Self-pay | Admitting: Oncology

## 2013-01-31 ENCOUNTER — Telehealth: Payer: Self-pay | Admitting: Oncology

## 2013-01-31 DIAGNOSIS — N400 Enlarged prostate without lower urinary tract symptoms: Secondary | ICD-10-CM | POA: Insufficient documentation

## 2013-01-31 NOTE — Telephone Encounter (Signed)
S/W PT IN RE LAB APPT ON 07/16 @ 10:30. PT CONFIRMED APPT.

## 2013-02-20 ENCOUNTER — Other Ambulatory Visit (HOSPITAL_BASED_OUTPATIENT_CLINIC_OR_DEPARTMENT_OTHER): Payer: Medicare Other | Admitting: Lab

## 2013-02-20 DIAGNOSIS — D72819 Decreased white blood cell count, unspecified: Secondary | ICD-10-CM

## 2013-02-20 DIAGNOSIS — D72821 Monocytosis (symptomatic): Secondary | ICD-10-CM

## 2013-02-20 DIAGNOSIS — D518 Other vitamin B12 deficiency anemias: Secondary | ICD-10-CM

## 2013-02-20 DIAGNOSIS — D519 Vitamin B12 deficiency anemia, unspecified: Secondary | ICD-10-CM

## 2013-02-20 DIAGNOSIS — D649 Anemia, unspecified: Secondary | ICD-10-CM

## 2013-02-20 LAB — CBC & DIFF AND RETIC
BASO%: 0.3 % (ref 0.0–2.0)
Eosinophils Absolute: 0 10*3/uL (ref 0.0–0.5)
HGB: 12.6 g/dL — ABNORMAL LOW (ref 13.0–17.1)
Immature Retic Fract: 6.5 % (ref 3.00–10.60)
LYMPH%: 36.1 % (ref 14.0–49.0)
MCH: 29.8 pg (ref 27.2–33.4)
MCV: 89.8 fL (ref 79.3–98.0)
MONO#: 0.4 10*3/uL (ref 0.1–0.9)
NEUT%: 50.1 % (ref 39.0–75.0)
Retic Ct Abs: 47.8 10*3/uL (ref 34.80–93.90)
lymph#: 1.3 10*3/uL (ref 0.9–3.3)
nRBC: 0 % (ref 0–0)

## 2013-02-20 LAB — IRON AND TIBC CHCC
%SAT: 28 % (ref 20–55)
Iron: 82 ug/dL (ref 42–163)
TIBC: 295 ug/dL (ref 202–409)
UIBC: 213 ug/dL (ref 117–376)

## 2013-02-20 LAB — COMPREHENSIVE METABOLIC PANEL (CC13)
AST: 14 U/L (ref 5–34)
Alkaline Phosphatase: 63 U/L (ref 40–150)
BUN: 12.8 mg/dL (ref 7.0–26.0)
Glucose: 89 mg/dl (ref 70–140)
Total Bilirubin: 0.59 mg/dL (ref 0.20–1.20)

## 2013-02-20 LAB — MORPHOLOGY
PLT EST: ADEQUATE
RBC Comments: NORMAL

## 2013-02-22 LAB — IMMUNOFIXATION ELECTROPHORESIS
IgA: 265 mg/dL (ref 68–379)
IgM, Serum: 110 mg/dL (ref 41–251)
Total Protein, Serum Electrophoresis: 6.6 g/dL (ref 6.0–8.3)

## 2013-02-22 LAB — VITAMIN B12: Vitamin B-12: 262 pg/mL (ref 211–911)

## 2013-02-27 ENCOUNTER — Other Ambulatory Visit: Payer: Medicare Other | Admitting: Lab

## 2013-02-27 ENCOUNTER — Ambulatory Visit (HOSPITAL_BASED_OUTPATIENT_CLINIC_OR_DEPARTMENT_OTHER): Payer: Medicare Other | Admitting: Oncology

## 2013-02-27 ENCOUNTER — Telehealth: Payer: Self-pay | Admitting: Oncology

## 2013-02-27 ENCOUNTER — Ambulatory Visit: Payer: Medicare Other

## 2013-02-27 ENCOUNTER — Encounter: Payer: Self-pay | Admitting: Oncology

## 2013-02-27 VITALS — BP 168/64 | HR 81 | Temp 97.7°F | Resp 18 | Ht 73.0 in | Wt 174.6 lb

## 2013-02-27 DIAGNOSIS — E538 Deficiency of other specified B group vitamins: Secondary | ICD-10-CM

## 2013-02-27 DIAGNOSIS — D649 Anemia, unspecified: Secondary | ICD-10-CM

## 2013-02-27 DIAGNOSIS — D72819 Decreased white blood cell count, unspecified: Secondary | ICD-10-CM

## 2013-02-27 DIAGNOSIS — D72821 Monocytosis (symptomatic): Secondary | ICD-10-CM

## 2013-02-27 MED ORDER — VITAMIN B-12 1000 MCG PO TABS: 1000.0000 ug | ORAL_TABLET | Freq: Every day | ORAL | Status: DC

## 2013-02-27 NOTE — Progress Notes (Signed)
New Patient Hematology-Oncology Evaluation   Cory Kemp [id number] 1940-09-30 72 y.o. 02/27/2013  CC: Dr. [name]   Reason for referral: Mild anemia, leukopenia and peripheral blood monocytosis   HPI:  New patient evaluation for this pleasant 72 year old man who has been in overall good health. He was told that he has a massively enlarged prostate and an elevated PSA of 27. He is being followed by a urologist in [place]. He has hypertension, hyperlipidemia and,  type 2 diabetes on oral agents for the last year. He has obstructive airway disease and continues to smoke one pack of cigarettes daily. Routine blood work done through his primary care physician's office on 01/08/2013 showed hemoglobin 13, hematocrit 39, MCV 91, white count 2400 with 25% neutrophils, 50% lymphocytes, 23% monocytes, and platelet count 173,000. Lab from 06/20/2012 with hemoglobin 13.7, hematocrit 41, white count 3800 with 40% neutrophils, 44% lymphocytes, 13 monocytes, and platelets 203,000. CBC in our office on 02/20/2013 with hemoglobin 12.6, hematocrit 38, MCV 90, white count 3500, 50% neutrophils, 36% lymphocytes, 13% monocytes, platelets 160,000.  I found a B12 level from 04/13/2012 of 173 (211-911). I repeated a B12 in anticipation of today's visit and it is in the low normal range at 262 (211-911) with normal folic acid 10.1. Ferritin is 40. Serum protein electrophoresis shows normal immunoglobulins and no monoclonal proteins on IFE. 160,000.  He is a former alcoholic who stopped completely 20 years ago. [place] but has not had any toxic or chemical exposures. He makes [employer] for a hobby. He is not on any of the usual medications that suppress bone marrow function. He has no signs or symptoms of a collagen vascular disorder. There is no family history of any blood disorder.   PMH: Past Medical History  Diagnosis Date  . Diabetes mellitus 01/10/2012   . Arthritis   . Hyperlipidemia   . Hypertension   . Femoral hernia, right 03/27/2012  . Incisional hernias - swiss-cheese-type periumbilical 01/10/2012  . Inguinal hernia - right 01/10/2012  . Glucose intolerance (impaired glucose tolerance)   . COPD (chronic obstructive pulmonary disease)   . Dementia     ?  [name] Kitchen Osteopenia   . Anemia   . Alcohol abuse: Remote. No alcohol for 20 years.    . Asthma   . Leukopenia 01/29/2013  . Monocytosis 01/29/2013  . Normochromic anemia 01/29/2013    Past Surgical History  Procedure Laterality Date  . Hemorrhoid surgery    . Tumor excision      intestinal after SBO ( Benign), appendectomy  . Hernia repair    . Ventral hernia repair  02/24/2012    Procedure: LAPAROSCOPIC VENTRAL HERNIA;  Surgeon: Ardeth Sportsman, MD;  Location: [facility];  Service: General;  Laterality: N/A;  lysis of adhesions, excision of abdominal wall lipomae  . Inguinal hernia repair  02/24/2012    Procedure: LAPAROSCOPIC INGUINAL HERNIA;  Surgeon: Ardeth Sportsman, MD;  Location: [facility];  Service: General;  Laterality: N/A;    Allergies: Allergies  Allergen Reactions  . Adhesive (Tape) Rash    Medications: Albuterol 2.5 mg by nebulizer every 6 hours when necessary. Cozaar 50 mg daily, Glucophage 500 mg daily, Naprosyn 220 mg when necessary pain, Zocor 40 mg each bedtime, Flomax 0.4 mg daily, vitamin D 1000 units daily.   Social History: Divorced. One pack per day smoker. No alcohol for 20 years. Son and daughter who are healthy. Occupation as  noted above.  reports that he has been smoking Cigarettes.  He has a 40 pack-year smoking history. He has never used smokeless tobacco. He reports that he does not drink alcohol or use illicit drugs.  Family History: Family History  Problem Relation Age of Onset  . Lung cancer Father   . Lung cancer Mother   . Diabetes Brother   One brother age 15 alive and well.  Review of Systems: Constitutional symptoms: No constitutional  symptoms HEENT: No sore throat Respiratory: No cough or dyspnea Cardiovascular:  No chest pain or palpitations Gastrointestinal ROS: No abdominal pain or change in bowel habit Genito-Urinary ROS: See above Hematological and Lymphatic: No swollen glands Musculoskeletal: No muscle bone or joint pain. He does have some degenerative arthritis in his neck which causes intermittent discomfort. Neurologic: No headache or change in vision. Dermatologic: No rash Remaining ROS negative.  Physical Exam: Blood pressure 168/64, pulse 81, temperature 97.7 F (36.5 C), temperature source Oral, resp. rate 18, height 6\' 1"  (1.854 m), weight 174 lb 9.6 oz (79.198 kg). Wt Readings from Last 3 Encounters:  02/27/13 174 lb 9.6 oz (79.198 kg)  05/08/12 180 lb (81.647 kg)  04/13/12 180 lb 4 oz (81.761 kg)    General appearance: Thin Caucasian man HENNT: Poor dentition with multiple carious teeth, pharynx no erythema exudate or mass, no thyromegaly or thyroid mass Lymph nodes: No lymphadenopathy Breasts: Lungs: Diffuse decreased breath sounds, resonant to percussion Heart: Regular rhythm no murmur Vascular: No carotid bruits, no cyanosis Abdominal: Soft, nontender, liver 3 fingers below right costal margin smooth edge GU: Extremities: No edema, no calf tenderness Neurologic: Mental status intact, PERRLA, optic discs sharp vessels normal, motor strength 5 over 5, reflexes 2+ symmetric, sensation intact to vibration over the fingertips. Skin: No rash or ecchymosis    Lab Results: Lab Results  Component Value Date   WBC 3.5* 02/20/2013   HGB 12.6* 02/20/2013   HCT 38.0* 02/20/2013   MCV 89.8 02/20/2013   PLT 160 02/20/2013     Chemistry      Component Value Date/Time   NA 140 02/20/2013 1148   NA 138 02/20/2012 0930   K 4.1 02/20/2013 1148   K 4.7 02/20/2012 0930   CL 102 02/20/2012 0930   CO2 26 02/20/2013 1148   CO2 30 02/20/2012 0930   BUN 12.8 02/20/2013 1148   BUN 15 02/20/2012 0930   CREATININE  0.8 02/20/2013 1148   CREATININE 0.84 02/20/2012 0930      Component Value Date/Time   CALCIUM 9.2 02/20/2013 1148   CALCIUM 9.5 02/20/2012 0930   ALKPHOS 63 02/20/2013 1148   AST 14 02/20/2013 1148   ALT 10 02/20/2013 1148   BILITOT 0.59 02/20/2013 1148       Review of peripheral blood film: Normochromic normocytic red cells, mature neutrophils, lymphocytes, and monocytes. platelets appear normal. No immature cells.    Impression and Plan:  Likely early myelodysplastic syndrome. He had up to 23% monocytes on one differential count. Monocytes now back to his baseline at 13%.  He has a borderline B12 level.  Myeloma screen negative   Recommendation: I am prescribing a B12 supplement 1 mg daily We discussed that he likely has an early bone marrow disorder. At this point he is asymptomatic. Even if he was  diagnosed with a low risk MDS, at this point, treatment would be observation alone. I would like to give him a period of observation before doing a bone marrow biopsy. I  will reevaluate him in 4 months.        Levert Feinstein, MD 02/27/2013, 8:31 PM

## 2013-02-27 NOTE — Progress Notes (Signed)
Checked in new patient. No financial issues. Didn't ask if living will/POA. He wants mail and phone only

## 2013-02-27 NOTE — Telephone Encounter (Signed)
Gave pt appt for lab and MD for November 2014 °

## 2013-03-24 ENCOUNTER — Encounter (HOSPITAL_COMMUNITY): Payer: Self-pay | Admitting: *Deleted

## 2013-03-24 ENCOUNTER — Emergency Department (HOSPITAL_COMMUNITY)
Admission: EM | Admit: 2013-03-24 | Discharge: 2013-03-24 | Disposition: A | Discharge status: Home / Self Care | Payer: Medicare Other | Attending: Emergency Medicine | Admitting: Emergency Medicine

## 2013-03-24 DIAGNOSIS — E785 Hyperlipidemia, unspecified: Secondary | ICD-10-CM | POA: Insufficient documentation

## 2013-03-24 DIAGNOSIS — F17200 Nicotine dependence, unspecified, uncomplicated: Secondary | ICD-10-CM | POA: Insufficient documentation

## 2013-03-24 DIAGNOSIS — S139XXA Sprain of joints and ligaments of unspecified parts of neck, initial encounter: Secondary | ICD-10-CM | POA: Insufficient documentation

## 2013-03-24 DIAGNOSIS — Z862 Personal history of diseases of the blood and blood-forming organs and certain disorders involving the immune mechanism: Secondary | ICD-10-CM | POA: Insufficient documentation

## 2013-03-24 DIAGNOSIS — J449 Chronic obstructive pulmonary disease, unspecified: Secondary | ICD-10-CM | POA: Insufficient documentation

## 2013-03-24 DIAGNOSIS — S161XXA Strain of muscle, fascia and tendon at neck level, initial encounter: Secondary | ICD-10-CM

## 2013-03-24 DIAGNOSIS — E119 Type 2 diabetes mellitus without complications: Secondary | ICD-10-CM | POA: Insufficient documentation

## 2013-03-24 DIAGNOSIS — Y929 Unspecified place or not applicable: Secondary | ICD-10-CM | POA: Insufficient documentation

## 2013-03-24 DIAGNOSIS — Z8719 Personal history of other diseases of the digestive system: Secondary | ICD-10-CM | POA: Insufficient documentation

## 2013-03-24 DIAGNOSIS — Y939 Activity, unspecified: Secondary | ICD-10-CM | POA: Insufficient documentation

## 2013-03-24 DIAGNOSIS — Z8739 Personal history of other diseases of the musculoskeletal system and connective tissue: Secondary | ICD-10-CM | POA: Insufficient documentation

## 2013-03-24 DIAGNOSIS — Z79899 Other long term (current) drug therapy: Secondary | ICD-10-CM | POA: Insufficient documentation

## 2013-03-24 DIAGNOSIS — X58XXXA Exposure to other specified factors, initial encounter: Secondary | ICD-10-CM | POA: Insufficient documentation

## 2013-03-24 DIAGNOSIS — D649 Anemia, unspecified: Secondary | ICD-10-CM | POA: Insufficient documentation

## 2013-03-24 DIAGNOSIS — Z8639 Personal history of other endocrine, nutritional and metabolic disease: Secondary | ICD-10-CM | POA: Insufficient documentation

## 2013-03-24 DIAGNOSIS — I10 Essential (primary) hypertension: Secondary | ICD-10-CM | POA: Insufficient documentation

## 2013-03-24 DIAGNOSIS — M129 Arthropathy, unspecified: Secondary | ICD-10-CM | POA: Insufficient documentation

## 2013-03-24 DIAGNOSIS — J4489 Other specified chronic obstructive pulmonary disease: Secondary | ICD-10-CM | POA: Insufficient documentation

## 2013-03-24 MED ORDER — HYDROCODONE-ACETAMINOPHEN 5-325 MG PO TABS: 1.0000 | ORAL_TABLET | ORAL | Status: DC | PRN

## 2013-03-24 MED ORDER — DIAZEPAM 5 MG PO TABS: 5.0000 mg | ORAL_TABLET | Freq: Two times a day (BID) | ORAL | Status: DC

## 2013-03-24 NOTE — ED Notes (Signed)
The pt is c/o neck pain since  Early today.  No known injury

## 2013-03-24 NOTE — ED Provider Notes (Signed)
CSN: [id number]     Arrival date & time 03/24/13  0023 History     None    Chief Complaint  Patient presents with  . Neck Pain   (Consider location/radiation/quality/duration/timing/severity/associated sxs/prior Treatment) HPI History provided by pt.   Pt has had severe, diffuse posterior neck pain for the past 2 days.  Constant.  Non-radiating.  Aggravating by head rotation.  Mild improvement w/ aleve. No associated fever, extremity weakness/paresthesias, bowel/bladder dysfunction.  Denies trauma.  Has had "cricks" in his neck in the past, and this feels like "a bad crick".  Has not had CP/SOB.  Past Medical History  Diagnosis Date  . Diabetes mellitus 01/10/2012  . Arthritis   . Hyperlipidemia   . Hypertension   . Femoral hernia, right 03/27/2012  . Incisional hernias - swiss-cheese-type periumbilical 01/10/2012  . Inguinal hernia - right 01/10/2012  . Glucose intolerance (impaired glucose tolerance)   . COPD (chronic obstructive pulmonary disease)   . Dementia     ?  [name] Kitchen Osteopenia   . Anemia   . Alcohol abuse   . Asthma   . Leukopenia 01/29/2013  . Monocytosis 01/29/2013  . Normochromic anemia 01/29/2013   Past Surgical History  Procedure Laterality Date  . Hemorrhoid surgery    . Tumor excision      intestinal after SBO ( Benign), appendectomy  . Hernia repair    . Ventral hernia repair  02/24/2012    Procedure: LAPAROSCOPIC VENTRAL HERNIA;  Surgeon: Ardeth Sportsman, MD;  Location: [facility];  Service: General;  Laterality: N/A;  lysis of adhesions, excision of abdominal wall lipomae  . Inguinal hernia repair  02/24/2012    Procedure: LAPAROSCOPIC INGUINAL HERNIA;  Surgeon: Ardeth Sportsman, MD;  Location: [facility];  Service: General;  Laterality: N/A;   Family History  Problem Relation Age of Onset  . Lung cancer Father   . Lung cancer Mother   . Diabetes Brother    History  Substance Use Topics  . Smoking status: Current Every Day Smoker -- 1.00 packs/day for 40 years   Types: Cigarettes  . Smokeless tobacco: Never Used  . Alcohol Use: No    Review of Systems  All other systems reviewed and are negative.    Allergies  Adhesive  Home Medications   Current Outpatient Rx  Name  Route  Sig  Dispense  Refill  . albuterol (PROVENTIL) (2.5 MG/3ML) 0.083% nebulizer solution   Nebulization   Take 2.5 mg by nebulization every 6 (six) hours as needed. Wheezing and shortness of breath         . cholecalciferol (VITAMIN D) 1000 UNITS tablet   Oral   Take 2,000 Units by mouth daily.          [name] Kitchen losartan (COZAAR) 50 MG tablet   Oral   Take 50 mg by mouth daily with breakfast.          . metFORMIN (GLUCOPHAGE) 500 MG tablet   Oral   Take 500 mg by mouth daily.          . Naproxen Sodium (ALEVE) 220 MG CAPS   Oral   Take 440 mg by mouth daily as needed (for pain). For pain         . simvastatin (ZOCOR) 40 MG tablet   Oral   Take 40 mg by mouth at bedtime.          . tamsulosin (FLOMAX) 0.4 MG CAPS capsule   Oral   Take  0.4 mg by mouth at bedtime.         . vitamin B-12 (CYANOCOBALAMIN) 1000 MCG tablet   Oral   Take 1 tablet (1,000 mcg total) by mouth daily.   30 tablet   11   . diazepam (VALIUM) 5 MG tablet   Oral   Take 1 tablet (5 mg total) by mouth 2 (two) times daily.   12 tablet   0   . HYDROcodone-acetaminophen (NORCO/VICODIN) 5-325 MG per tablet   Oral   Take 1 tablet by mouth every 4 (four) hours as needed for pain.   20 tablet   0    BP 170/70  Pulse 82  Temp(Src) 98.5 F (36.9 C) (Oral)  Resp 16  SpO2 99% Physical Exam  Nursing note and vitals reviewed. Constitutional: He is oriented to person, place, and time. He appears well-developed and well-nourished. No distress.  HENT:  Head: Normocephalic and atraumatic.  Eyes:  Normal appearance  Neck: Normal range of motion.  Cardiovascular: Normal rate and regular rhythm.   Pulmonary/Chest: Effort normal and breath sounds normal. No respiratory distress.   Musculoskeletal: Normal range of motion.  No bony tenderness of spine.  Tenderness bilateral trap and cervical paraspinals.  Pain w/ rotation of head.  5/5 strength in biceps/triceps as well as grip bilaterally.  Nml bicep and patellar reflexes.  NV intact all four extremities.   Neurological: He is alert and oriented to person, place, and time.  Skin: Skin is warm and dry. No rash noted.  Psychiatric: He has a normal mood and affect. His behavior is normal.    ED Course   Procedures (including critical care time)  Labs Reviewed - No data to display No results found. 1. Cervical strain, initial encounter     MDM  72yo M presents w/ non-traumatic posterior neck pain that he describes a "crick".  No CP/SOB.  No fever, bony tenderness or NV deficits on exam.  Pt prescribed vicodin/valium for pain and I recommended that he continue aleve bid and use warm compresses as well.  He has taken percocet in the past and tolerated it well.   Return precautions discussed.   Otilio Miu, PA-C 03/24/13 2129

## 2013-03-25 NOTE — ED Provider Notes (Signed)
Medical screening examination/treatment/procedure(s) were performed by non-physician practitioner and as supervising physician I was immediately available for consultation/collaboration.  Charmayne Odell, MD 03/25/13 0233 

## 2013-06-25 ENCOUNTER — Telehealth: Payer: Self-pay | Admitting: Oncology

## 2013-06-25 ENCOUNTER — Other Ambulatory Visit (HOSPITAL_BASED_OUTPATIENT_CLINIC_OR_DEPARTMENT_OTHER): Payer: Medicare Other

## 2013-06-25 DIAGNOSIS — D72819 Decreased white blood cell count, unspecified: Secondary | ICD-10-CM

## 2013-06-25 DIAGNOSIS — D72821 Monocytosis (symptomatic): Secondary | ICD-10-CM

## 2013-06-25 DIAGNOSIS — D649 Anemia, unspecified: Secondary | ICD-10-CM

## 2013-06-25 LAB — MORPHOLOGY: PLT EST: ADEQUATE

## 2013-06-25 LAB — CBC & DIFF AND RETIC
BASO%: 0.3 % (ref 0.0–2.0)
Immature Retic Fract: 3.8 % (ref 3.00–10.60)
LYMPH%: 43.4 % (ref 14.0–49.0)
MCHC: 33.4 g/dL (ref 32.0–36.0)
MONO#: 0.6 10*3/uL (ref 0.1–0.9)
NEUT#: 1 10*3/uL — ABNORMAL LOW (ref 1.5–6.5)
Platelets: 165 10*3/uL (ref 140–400)
RBC: 4.45 10*6/uL (ref 4.20–5.82)
RDW: 14.3 % (ref 11.0–14.6)
Retic %: 1.09 % (ref 0.80–1.80)
WBC: 3 10*3/uL — ABNORMAL LOW (ref 4.0–10.3)
lymph#: 1.3 10*3/uL (ref 0.9–3.3)
nRBC: 0 % (ref 0–0)

## 2013-06-25 NOTE — Telephone Encounter (Signed)
pt walked in and rs from 11/25 to 12/2 Cal given shh

## 2013-07-02 ENCOUNTER — Ambulatory Visit: Payer: Medicare Other | Admitting: Oncology

## 2013-07-03 ENCOUNTER — Emergency Department (HOSPITAL_COMMUNITY)
Admission: EM | Admit: 2013-07-03 | Discharge: 2013-07-03 | Disposition: A | Discharge status: Home / Self Care | Payer: Medicare Other | Attending: Emergency Medicine | Admitting: Emergency Medicine

## 2013-07-03 ENCOUNTER — Encounter (HOSPITAL_COMMUNITY): Payer: Self-pay | Admitting: Emergency Medicine

## 2013-07-03 DIAGNOSIS — F1010 Alcohol abuse, uncomplicated: Secondary | ICD-10-CM | POA: Insufficient documentation

## 2013-07-03 DIAGNOSIS — N401 Enlarged prostate with lower urinary tract symptoms: Secondary | ICD-10-CM

## 2013-07-03 DIAGNOSIS — J45909 Unspecified asthma, uncomplicated: Secondary | ICD-10-CM | POA: Insufficient documentation

## 2013-07-03 DIAGNOSIS — R141 Gas pain: Secondary | ICD-10-CM | POA: Insufficient documentation

## 2013-07-03 DIAGNOSIS — M899 Disorder of bone, unspecified: Secondary | ICD-10-CM | POA: Insufficient documentation

## 2013-07-03 DIAGNOSIS — E119 Type 2 diabetes mellitus without complications: Secondary | ICD-10-CM | POA: Insufficient documentation

## 2013-07-03 DIAGNOSIS — N403 Nodular prostate with lower urinary tract symptoms: Secondary | ICD-10-CM | POA: Insufficient documentation

## 2013-07-03 DIAGNOSIS — N138 Other obstructive and reflux uropathy: Secondary | ICD-10-CM | POA: Insufficient documentation

## 2013-07-03 DIAGNOSIS — F17200 Nicotine dependence, unspecified, uncomplicated: Secondary | ICD-10-CM | POA: Insufficient documentation

## 2013-07-03 DIAGNOSIS — Z79899 Other long term (current) drug therapy: Secondary | ICD-10-CM | POA: Insufficient documentation

## 2013-07-03 DIAGNOSIS — R300 Dysuria: Secondary | ICD-10-CM | POA: Insufficient documentation

## 2013-07-03 DIAGNOSIS — E785 Hyperlipidemia, unspecified: Secondary | ICD-10-CM | POA: Insufficient documentation

## 2013-07-03 DIAGNOSIS — J4489 Other specified chronic obstructive pulmonary disease: Secondary | ICD-10-CM | POA: Insufficient documentation

## 2013-07-03 DIAGNOSIS — Z8719 Personal history of other diseases of the digestive system: Secondary | ICD-10-CM | POA: Insufficient documentation

## 2013-07-03 DIAGNOSIS — M129 Arthropathy, unspecified: Secondary | ICD-10-CM | POA: Insufficient documentation

## 2013-07-03 DIAGNOSIS — R109 Unspecified abdominal pain: Secondary | ICD-10-CM | POA: Insufficient documentation

## 2013-07-03 DIAGNOSIS — R142 Eructation: Secondary | ICD-10-CM | POA: Insufficient documentation

## 2013-07-03 DIAGNOSIS — D649 Anemia, unspecified: Secondary | ICD-10-CM | POA: Insufficient documentation

## 2013-07-03 DIAGNOSIS — I10 Essential (primary) hypertension: Secondary | ICD-10-CM | POA: Insufficient documentation

## 2013-07-03 DIAGNOSIS — K5900 Constipation, unspecified: Secondary | ICD-10-CM | POA: Insufficient documentation

## 2013-07-03 DIAGNOSIS — F0390 Unspecified dementia without behavioral disturbance: Secondary | ICD-10-CM | POA: Insufficient documentation

## 2013-07-03 DIAGNOSIS — J449 Chronic obstructive pulmonary disease, unspecified: Secondary | ICD-10-CM | POA: Insufficient documentation

## 2013-07-03 LAB — URINE MICROSCOPIC-ADD ON

## 2013-07-03 LAB — URINALYSIS, ROUTINE W REFLEX MICROSCOPIC
Glucose, UA: NEGATIVE mg/dL
Leukocytes, UA: NEGATIVE
Protein, ur: NEGATIVE mg/dL
Specific Gravity, Urine: 1.009 (ref 1.005–1.030)
Urobilinogen, UA: 1 mg/dL (ref 0.0–1.0)

## 2013-07-03 NOTE — ED Notes (Signed)
Patient states he does not have any pressure or pain

## 2013-07-03 NOTE — ED Notes (Signed)
Insertion of foly cath causing immediate relief of pain down to a zero level,

## 2013-07-03 NOTE — ED Notes (Signed)
Pt states that his prostate is enlarged. Pt states that at 0300 he attempted to urinate and he could not urinate. States that he has also had trouble with his bowels. States that usually when this happens it is because of his prostate and he requires catheterization. Pt states he takes Flomax.

## 2013-07-03 NOTE — ED Provider Notes (Signed)
CSN: [id number]     Arrival date & time 07/03/13  4098 History   First MD Initiated Contact with Patient 07/03/13 [phone]     Chief Complaint  Patient presents with  . Benign Prostatic Hypertrophy   (Consider location/radiation/quality/duration/timing/severity/associated sxs/prior Treatment) HPI 72 year old male presents to emergency room from home with complaint of urinary retention.  Patient reports he has a history of BPH.  He has occasionally required catheterization in the past 2 to obstruction.  He, reports he's been taking his Flomax as instructed.  He reports over the last 2-3 days, he has had some slight burning with urination.  He also reports some constipation.  He reports that when he has urinary retention, it is usually a combination of BPH and constipation.  He denies any fever, chills, no back pain. Past Medical History  Diagnosis Date  . Diabetes mellitus 01/10/2012  . Arthritis   . Hyperlipidemia   . Hypertension   . Femoral hernia, right 03/27/2012  . Incisional hernias - swiss-cheese-type periumbilical 01/10/2012  . Inguinal hernia - right 01/10/2012  . Glucose intolerance (impaired glucose tolerance)   . COPD (chronic obstructive pulmonary disease)   . Dementia     ?  [name] Kitchen Osteopenia   . Anemia   . Alcohol abuse   . Asthma   . Leukopenia 01/29/2013  . Monocytosis 01/29/2013  . Normochromic anemia 01/29/2013   Past Surgical History  Procedure Laterality Date  . Hemorrhoid surgery    . Tumor excision      intestinal after SBO ( Benign), appendectomy  . Hernia repair    . Ventral hernia repair  02/24/2012    Procedure: LAPAROSCOPIC VENTRAL HERNIA;  Surgeon: Ardeth Sportsman, MD;  Location: [facility];  Service: General;  Laterality: N/A;  lysis of adhesions, excision of abdominal wall lipomae  . Inguinal hernia repair  02/24/2012    Procedure: LAPAROSCOPIC INGUINAL HERNIA;  Surgeon: Ardeth Sportsman, MD;  Location: [facility];  Service: General;  Laterality: N/A;   Family History   Problem Relation Age of Onset  . Lung cancer Father   . Lung cancer Mother   . Diabetes Brother    History  Substance Use Topics  . Smoking status: Current Every Day Smoker -- 1.00 packs/day for 40 years    Types: Cigarettes  . Smokeless tobacco: Never Used  . Alcohol Use: No    Review of Systems  See History of Present Illness; otherwise all other systems are reviewed and negative Allergies  Adhesive  Home Medications   Current Outpatient Rx  Name  Route  Sig  Dispense  Refill  . albuterol (PROVENTIL) (2.5 MG/3ML) 0.083% nebulizer solution   Nebulization   Take 2.5 mg by nebulization every 6 (six) hours as needed. Wheezing and shortness of breath         . cholecalciferol (VITAMIN D) 1000 UNITS tablet   Oral   Take 2,000 Units by mouth daily.          . diazepam (VALIUM) 5 MG tablet   Oral   Take 1 tablet (5 mg total) by mouth 2 (two) times daily.   12 tablet   0   . HYDROcodone-acetaminophen (NORCO/VICODIN) 5-325 MG per tablet   Oral   Take 1 tablet by mouth every 4 (four) hours as needed for pain.   20 tablet   0   . losartan (COZAAR) 50 MG tablet   Oral   Take 50 mg by mouth daily with breakfast.          .  metFORMIN (GLUCOPHAGE) 500 MG tablet   Oral   Take 500 mg by mouth daily.          . Naproxen Sodium (ALEVE) 220 MG CAPS   Oral   Take 440 mg by mouth daily as needed (for pain). For pain         . simvastatin (ZOCOR) 40 MG tablet   Oral   Take 40 mg by mouth at bedtime.          . tamsulosin (FLOMAX) 0.4 MG CAPS capsule   Oral   Take 0.4 mg by mouth at bedtime.         . vitamin B-12 (CYANOCOBALAMIN) 1000 MCG tablet   Oral   Take 1 tablet (1,000 mcg total) by mouth daily.   30 tablet   11    BP 160/85  Pulse 107  Temp(Src) 97.8 F (36.6 C) (Oral)  Resp 18  Wt 175 lb (79.379 kg)  SpO2 97% Physical Exam  Nursing note and vitals reviewed. Constitutional: He is oriented to person, place, and time. He appears  well-developed and well-nourished. He appears distressed (uncomfortable appearing).  HENT:  Head: Normocephalic and atraumatic.  Nose: Nose normal.  Mouth/Throat: Oropharynx is clear and moist.  Eyes: Conjunctivae and EOM are normal. Pupils are equal, round, and reactive to light.  Neck: Normal range of motion. Neck supple. No JVD present. No tracheal deviation present. No thyromegaly present.  Cardiovascular: Normal rate, regular rhythm, normal heart sounds and intact distal pulses.  Exam reveals no gallop and no friction rub.   No murmur heard. Pulmonary/Chest: Effort normal and breath sounds normal. No stridor. No respiratory distress. He has no wheezes. He has no rales. He exhibits no tenderness.  Abdominal: Soft. Bowel sounds are normal. He exhibits distension. He exhibits no mass. There is tenderness (patient with suprapubic tenderness and distention). There is no rebound and no guarding.  Musculoskeletal: Normal range of motion. He exhibits no edema and no tenderness.  Lymphadenopathy:    He has no cervical adenopathy.  Neurological: He is alert and oriented to person, place, and time. He exhibits normal muscle tone. Coordination normal.  Skin: Skin is warm and dry. No rash noted. No erythema. No pallor.  Psychiatric: He has a normal mood and affect. His behavior is normal. Judgment and thought content normal.    ED Course  Procedures (including critical care time) Labs Review Labs Reviewed  URINALYSIS, ROUTINE W REFLEX MICROSCOPIC - Abnormal; Notable for the following:    Hgb urine dipstick SMALL (*)    All other components within normal limits  URINE MICROSCOPIC-ADD ON   Imaging Review No results found.  EKG Interpretation   None       MDM   1. Benign prostatic hypertrophy with urinary retention    72 year old male with acute urinary retention.  After placement of Foley catheter, patient had 700 cc return.  Patient sees a urologist in [place].  We'll have him  followup with his urologist for removal of the catheter.    Olivia Mackie, MD 07/03/13 [phone]

## 2013-07-09 ENCOUNTER — Ambulatory Visit: Payer: Medicare Other | Admitting: Oncology

## 2013-07-10 ENCOUNTER — Telehealth: Payer: Self-pay | Admitting: Oncology

## 2013-07-10 NOTE — Telephone Encounter (Signed)
pt and dgt walked in confused about missed appts of late Dgt wanted to sw nurse to determine if return ov necessary and if so when [facility] for Dr Cephas Darby nurse to call dgt at [phone] shh

## 2013-07-11 ENCOUNTER — Ambulatory Visit: Payer: Medicare Other | Admitting: Oncology

## 2013-07-12 ENCOUNTER — Encounter (HOSPITAL_COMMUNITY): Payer: Self-pay | Admitting: Emergency Medicine

## 2013-07-12 ENCOUNTER — Emergency Department (HOSPITAL_COMMUNITY)
Admission: EM | Admit: 2013-07-12 | Discharge: 2013-07-12 | Disposition: A | Discharge status: Home / Self Care | Payer: Medicare Other | Attending: Emergency Medicine | Admitting: Emergency Medicine

## 2013-07-12 DIAGNOSIS — Z79899 Other long term (current) drug therapy: Secondary | ICD-10-CM | POA: Insufficient documentation

## 2013-07-12 DIAGNOSIS — R300 Dysuria: Secondary | ICD-10-CM | POA: Insufficient documentation

## 2013-07-12 DIAGNOSIS — M129 Arthropathy, unspecified: Secondary | ICD-10-CM | POA: Insufficient documentation

## 2013-07-12 DIAGNOSIS — I10 Essential (primary) hypertension: Secondary | ICD-10-CM | POA: Insufficient documentation

## 2013-07-12 DIAGNOSIS — R339 Retention of urine, unspecified: Secondary | ICD-10-CM

## 2013-07-12 DIAGNOSIS — E785 Hyperlipidemia, unspecified: Secondary | ICD-10-CM | POA: Insufficient documentation

## 2013-07-12 DIAGNOSIS — F17200 Nicotine dependence, unspecified, uncomplicated: Secondary | ICD-10-CM | POA: Insufficient documentation

## 2013-07-12 DIAGNOSIS — Z8719 Personal history of other diseases of the digestive system: Secondary | ICD-10-CM | POA: Insufficient documentation

## 2013-07-12 DIAGNOSIS — E119 Type 2 diabetes mellitus without complications: Secondary | ICD-10-CM | POA: Insufficient documentation

## 2013-07-12 DIAGNOSIS — J449 Chronic obstructive pulmonary disease, unspecified: Secondary | ICD-10-CM | POA: Insufficient documentation

## 2013-07-12 DIAGNOSIS — J4489 Other specified chronic obstructive pulmonary disease: Secondary | ICD-10-CM | POA: Insufficient documentation

## 2013-07-12 DIAGNOSIS — D649 Anemia, unspecified: Secondary | ICD-10-CM | POA: Insufficient documentation

## 2013-07-12 DIAGNOSIS — N400 Enlarged prostate without lower urinary tract symptoms: Secondary | ICD-10-CM | POA: Insufficient documentation

## 2013-07-12 LAB — URINE MICROSCOPIC-ADD ON

## 2013-07-12 LAB — URINALYSIS, ROUTINE W REFLEX MICROSCOPIC
Glucose, UA: NEGATIVE mg/dL
Leukocytes, UA: NEGATIVE
Specific Gravity, Urine: 1.018 (ref 1.005–1.030)
pH: 5.5 (ref 5.0–8.0)

## 2013-07-12 NOTE — ED Notes (Signed)
Patient woke up with the urge to urinate and was unable to.  Patient has a history of urinary retention and BPH

## 2013-07-12 NOTE — ED Provider Notes (Signed)
72 year old male with a history of BPH and urinary retention states that he was able to urinate normally when he went to bed last night but was unable to urinate this morning. He is very uncomfortable. On exam, he appears uncomfortable. Bladder is distended. Foley catheter is place to tilting clear urine.  Medical screening examination/treatment/procedure(s) were conducted as a shared visit with non-physician practitioner(s) and myself.  I personally evaluated the patient during the encounter.    Dione Booze, MD 07/12/13 [phone]

## 2013-07-12 NOTE — ED Notes (Signed)
Pt sleeping. 

## 2013-07-12 NOTE — ED Notes (Signed)
requested and was fitted with leg bag

## 2013-07-12 NOTE — ED Provider Notes (Signed)
CSN: [id number]     Arrival date & time 07/12/13  0609 History   First MD Initiated Contact with Patient 07/12/13 [phone]     Chief Complaint  Patient presents with  . Urinary Retention   (Consider location/radiation/quality/duration/timing/severity/associated sxs/prior Treatment) The history is provided by the patient and medical records.   This is a 72 year old male with history of BPH, presented to the ED for urinary retention.  Pt states yesterday he was urinating as normal but has been unable to do so this morning.  Notes he did have some mild dysuria over the past few days. Pt states this is the second time this has occurred in the past 2 weeks.  He has continued taking his Flomax as directed but states his urologist in [place] has switched it to generic-- unsure if this is causing his problems.  States these episodes always seem to occur when he is constipated.  No fevers, sweats, or chills.    Past Medical History  Diagnosis Date  . Diabetes mellitus 01/10/2012  . Arthritis   . Hyperlipidemia   . Hypertension   . Femoral hernia, right 03/27/2012  . Incisional hernias - swiss-cheese-type periumbilical 01/10/2012  . Inguinal hernia - right 01/10/2012  . Glucose intolerance (impaired glucose tolerance)   . COPD (chronic obstructive pulmonary disease)   . Dementia     ?  [name] Kitchen Osteopenia   . Anemia   . Alcohol abuse   . Asthma   . Leukopenia 01/29/2013  . Monocytosis 01/29/2013  . Normochromic anemia 01/29/2013   Past Surgical History  Procedure Laterality Date  . Hemorrhoid surgery    . Tumor excision      intestinal after SBO ( Benign), appendectomy  . Hernia repair    . Ventral hernia repair  02/24/2012    Procedure: LAPAROSCOPIC VENTRAL HERNIA;  Surgeon: Ardeth Sportsman, MD;  Location: [facility];  Service: General;  Laterality: N/A;  lysis of adhesions, excision of abdominal wall lipomae  . Inguinal hernia repair  02/24/2012    Procedure: LAPAROSCOPIC INGUINAL HERNIA;  Surgeon:  Ardeth Sportsman, MD;  Location: [facility];  Service: General;  Laterality: N/A;   Family History  Problem Relation Age of Onset  . Lung cancer Father   . Lung cancer Mother   . Diabetes Brother    History  Substance Use Topics  . Smoking status: Current Every Day Smoker -- 1.00 packs/day for 40 years    Types: Cigarettes  . Smokeless tobacco: Never Used  . Alcohol Use: No    Review of Systems  Genitourinary: Positive for difficulty urinating.  All other systems reviewed and are negative.    Allergies  Adhesive  Home Medications   Current Outpatient Rx  Name  Route  Sig  Dispense  Refill  . albuterol (PROVENTIL) (2.5 MG/3ML) 0.083% nebulizer solution   Nebulization   Take 2.5 mg by nebulization every 6 (six) hours as needed. Wheezing and shortness of breath         . cholecalciferol (VITAMIN D) 1000 UNITS tablet   Oral   Take 2,000 Units by mouth daily.          . diazepam (VALIUM) 5 MG tablet   Oral   Take 1 tablet (5 mg total) by mouth 2 (two) times daily.   12 tablet   0   . HYDROcodone-acetaminophen (NORCO/VICODIN) 5-325 MG per tablet   Oral   Take 1 tablet by mouth every 4 (four) hours as needed for pain.  20 tablet   0   . losartan (COZAAR) 50 MG tablet   Oral   Take 50 mg by mouth daily with breakfast.          . metFORMIN (GLUCOPHAGE) 500 MG tablet   Oral   Take 500 mg by mouth daily.          . Naproxen Sodium (ALEVE) 220 MG CAPS   Oral   Take 440 mg by mouth daily as needed (for pain). For pain         . simvastatin (ZOCOR) 40 MG tablet   Oral   Take 40 mg by mouth at bedtime.          . tamsulosin (FLOMAX) 0.4 MG CAPS capsule   Oral   Take 0.4 mg by mouth at bedtime.         . vitamin B-12 (CYANOCOBALAMIN) 1000 MCG tablet   Oral   Take 1 tablet (1,000 mcg total) by mouth daily.   30 tablet   11    Pulse 110  Temp(Src) 98 F (36.7 C) (Oral)  Resp 13  SpO2 98%  Physical Exam  Nursing note and vitals  reviewed. Constitutional: He is oriented to person, place, and time. He appears well-developed and well-nourished. No distress.  HENT:  Head: Normocephalic and atraumatic.  Mouth/Throat: Oropharynx is clear and moist.  Eyes: Conjunctivae and EOM are normal. Pupils are equal, round, and reactive to light.  Neck: Normal range of motion. Neck supple.  Cardiovascular: Normal rate, regular rhythm and normal heart sounds.   Pulmonary/Chest: Effort normal and breath sounds normal. No respiratory distress. He has no wheezes.  Abdominal: Soft. Bowel sounds are normal. There is no tenderness. There is no guarding.  "pressure" sensation suprapubically  Musculoskeletal: Normal range of motion.  Neurological: He is alert and oriented to person, place, and time.  Skin: Skin is warm and dry. He is not diaphoretic.  Psychiatric: He has a normal mood and affect.    ED Course  Procedures (including critical care time) Labs Review Labs Reviewed  URINALYSIS, ROUTINE W REFLEX MICROSCOPIC   Imaging Review No results found.  EKG Interpretation   None       MDM   1. Urinary retention    Foley catheter inserted with 500+ cc of urine returned, small amount of pink mucus noted but no gross blood.  U/a without signs of infection.  Foley continues to drain clear urine-- will be left in place.  Instructed pt to FU with his urologist today.  Return to the ED for grossly bloody urine, foley not draining.  Discussed plan with pt, he acknowledged understanding and agreed.  Garlon Hatchet, PA-C 07/12/13 [phone]

## 2013-07-17 ENCOUNTER — Other Ambulatory Visit: Payer: Self-pay | Admitting: Oncology

## 2013-07-17 DIAGNOSIS — D72819 Decreased white blood cell count, unspecified: Secondary | ICD-10-CM

## 2013-07-17 DIAGNOSIS — D72821 Monocytosis (symptomatic): Secondary | ICD-10-CM

## 2013-07-17 DIAGNOSIS — D649 Anemia, unspecified: Secondary | ICD-10-CM

## 2013-07-18 ENCOUNTER — Telehealth: Payer: Self-pay | Admitting: Oncology

## 2013-07-18 NOTE — Telephone Encounter (Signed)
s.w. pt and advised on Jan 2015 appt...pt requested to kno about labs..transferred to desk nurse

## 2013-07-22 ENCOUNTER — Emergency Department (HOSPITAL_COMMUNITY)
Admission: EM | Admit: 2013-07-22 | Discharge: 2013-07-22 | Disposition: A | Discharge status: Home / Self Care | Payer: Medicare Other | Attending: Emergency Medicine | Admitting: Emergency Medicine

## 2013-07-22 ENCOUNTER — Encounter (HOSPITAL_COMMUNITY): Payer: Self-pay | Admitting: Emergency Medicine

## 2013-07-22 DIAGNOSIS — N400 Enlarged prostate without lower urinary tract symptoms: Secondary | ICD-10-CM | POA: Insufficient documentation

## 2013-07-22 DIAGNOSIS — F17200 Nicotine dependence, unspecified, uncomplicated: Secondary | ICD-10-CM | POA: Insufficient documentation

## 2013-07-22 DIAGNOSIS — Z79899 Other long term (current) drug therapy: Secondary | ICD-10-CM | POA: Insufficient documentation

## 2013-07-22 DIAGNOSIS — E785 Hyperlipidemia, unspecified: Secondary | ICD-10-CM | POA: Insufficient documentation

## 2013-07-22 DIAGNOSIS — J449 Chronic obstructive pulmonary disease, unspecified: Secondary | ICD-10-CM | POA: Insufficient documentation

## 2013-07-22 DIAGNOSIS — F0390 Unspecified dementia without behavioral disturbance: Secondary | ICD-10-CM | POA: Insufficient documentation

## 2013-07-22 DIAGNOSIS — M129 Arthropathy, unspecified: Secondary | ICD-10-CM | POA: Insufficient documentation

## 2013-07-22 DIAGNOSIS — R339 Retention of urine, unspecified: Secondary | ICD-10-CM | POA: Insufficient documentation

## 2013-07-22 DIAGNOSIS — I10 Essential (primary) hypertension: Secondary | ICD-10-CM | POA: Insufficient documentation

## 2013-07-22 DIAGNOSIS — B820 Intestinal helminthiasis, unspecified: Secondary | ICD-10-CM | POA: Insufficient documentation

## 2013-07-22 DIAGNOSIS — E119 Type 2 diabetes mellitus without complications: Secondary | ICD-10-CM | POA: Insufficient documentation

## 2013-07-22 DIAGNOSIS — Z862 Personal history of diseases of the blood and blood-forming organs and certain disorders involving the immune mechanism: Secondary | ICD-10-CM | POA: Insufficient documentation

## 2013-07-22 DIAGNOSIS — J4489 Other specified chronic obstructive pulmonary disease: Secondary | ICD-10-CM | POA: Insufficient documentation

## 2013-07-22 HISTORY — DX: Benign prostatic hyperplasia without lower urinary tract symptoms: N40.0

## 2013-07-22 LAB — URINALYSIS W MICROSCOPIC + REFLEX CULTURE
Bilirubin Urine: NEGATIVE
Glucose, UA: NEGATIVE mg/dL
Ketones, ur: NEGATIVE mg/dL
Leukocytes, UA: NEGATIVE
Nitrite: NEGATIVE
Protein, ur: NEGATIVE mg/dL

## 2013-07-22 NOTE — ED Notes (Signed)
Applied leg bag for pt to discharge with.

## 2013-07-22 NOTE — ED Provider Notes (Signed)
CSN: [id number]     Arrival date & time 07/22/13  6644 History   First MD Initiated Contact with Patient 07/22/13 [phone]     Chief Complaint  Patient presents with  . Urinary Retention   HPI Pt was seen at 0720.  Per pt, c/o gradual onset and persistence of constant urinary retention since last night approx 2000. Pt states he has hx of same symptoms for the past several years, dx BPH, and is taking flomax.  Pt has been evaluated in the ED 3 previous times within the past month for same and has had a foley catheter placed. Pt states he has Uro MD f/u appt on 12/23. Denies dysuria/hematuria, no flank pain, no N/V/D, no testicular pain/swelling.    Past Medical History  Diagnosis Date  . Diabetes mellitus 01/10/2012  . Arthritis   . Hyperlipidemia   . Hypertension   . Femoral hernia, right 03/27/2012  . Incisional hernias - swiss-cheese-type periumbilical 01/10/2012  . Inguinal hernia - right 01/10/2012  . Glucose intolerance (impaired glucose tolerance)   . COPD (chronic obstructive pulmonary disease)   . Dementia     ?  [name] Kitchen Osteopenia   . Anemia   . Alcohol abuse   . Asthma   . Leukopenia 01/29/2013  . Monocytosis 01/29/2013  . Normochromic anemia 01/29/2013  . BPH (benign prostatic hypertrophy)    Past Surgical History  Procedure Laterality Date  . Hemorrhoid surgery    . Tumor excision      intestinal after SBO ( Benign), appendectomy  . Hernia repair    . Ventral hernia repair  02/24/2012    Procedure: LAPAROSCOPIC VENTRAL HERNIA;  Surgeon: Ardeth Sportsman, MD;  Location: [facility];  Service: General;  Laterality: N/A;  lysis of adhesions, excision of abdominal wall lipomae  . Inguinal hernia repair  02/24/2012    Procedure: LAPAROSCOPIC INGUINAL HERNIA;  Surgeon: Ardeth Sportsman, MD;  Location: [facility];  Service: General;  Laterality: N/A;   Family History  Problem Relation Age of Onset  . Lung cancer Father   . Lung cancer Mother   . Diabetes Brother    History  Substance Use  Topics  . Smoking status: Current Every Day Smoker -- 1.00 packs/day for 40 years    Types: Cigarettes  . Smokeless tobacco: Never Used  . Alcohol Use: No    Review of Systems ROS: Statement: All systems negative except as marked or noted in the HPI; Constitutional: Negative for fever and chills. ; ; Eyes: Negative for eye pain, redness and discharge. ; ; ENMT: Negative for ear pain, hoarseness, nasal congestion, sinus pressure and sore throat. ; ; Cardiovascular: Negative for chest pain, palpitations, diaphoresis, dyspnea and peripheral edema. ; ; Respiratory: Negative for cough, wheezing and stridor. ; ; Gastrointestinal: Negative for nausea, vomiting, diarrhea, abdominal pain, blood in stool, hematemesis, jaundice and rectal bleeding. . ; ; Genitourinary: +urinary retention. Negative for dysuria, flank pain and hematuria. ; ; Genital:  No penile drainage or rash, no testicular pain or swelling, no scrotal rash or swelling.;;  Musculoskeletal: Negative for back pain and neck pain. Negative for swelling and trauma.; ; Skin: Negative for pruritus, rash, abrasions, blisters, bruising and skin lesion.; ; Neuro: Negative for headache, lightheadedness and neck stiffness. Negative for weakness, altered level of consciousness , altered mental status, extremity weakness, paresthesias, involuntary movement, seizure and syncope.       Allergies  Adhesive  Home Medications   Current Outpatient Rx  Name  Route  Sig  Dispense  Refill  . albuterol (PROVENTIL) (2.5 MG/3ML) 0.083% nebulizer solution   Nebulization   Take 2.5 mg by nebulization every 6 (six) hours as needed. Wheezing and shortness of breath         . cholecalciferol (VITAMIN D) 1000 UNITS tablet   Oral   Take 2,000 Units by mouth daily.          . diazepam (VALIUM) 5 MG tablet   Oral   Take 1 tablet (5 mg total) by mouth 2 (two) times daily.   12 tablet   0   . HYDROcodone-acetaminophen (NORCO/VICODIN) 5-325 MG per tablet    Oral   Take 1 tablet by mouth every 4 (four) hours as needed for pain.   20 tablet   0   . losartan (COZAAR) 50 MG tablet   Oral   Take 50 mg by mouth daily with breakfast.          . Naproxen Sodium (ALEVE) 220 MG CAPS   Oral   Take 440 mg by mouth daily as needed (for pain). For pain         . simvastatin (ZOCOR) 40 MG tablet   Oral   Take 40 mg by mouth at bedtime.          . tamsulosin (FLOMAX) 0.4 MG CAPS capsule   Oral   Take 0.4 mg by mouth at bedtime.         . vitamin B-12 (CYANOCOBALAMIN) 1000 MCG tablet   Oral   Take 1 tablet (1,000 mcg total) by mouth daily.   30 tablet   11    BP 159/88  Pulse 107  Temp(Src) 97.3 F (36.3 C) (Oral)  Resp 20  Ht 6\' 1"  (1.854 m)  Wt 175 lb (79.379 kg)  BMI 23.09 kg/m2  SpO2 98% Physical Exam 0725: Physical examination:  Nursing notes reviewed; Vital signs and O2 SAT reviewed;  Constitutional: Well developed, Well nourished, Well hydrated, In no acute distress; Head:  Normocephalic, atraumatic; Eyes: EOMI, PERRL, No scleral icterus; ENMT: Mouth and pharynx normal, Mucous membranes moist; Neck: Supple, Full range of motion, No lymphadenopathy; Cardiovascular: Regular rate and rhythm, No gallop; Respiratory: Breath sounds clear & equal bilaterally, No wheezes.  Speaking full sentences with ease, Normal respiratory effort/excursion; Chest: Nontender, Movement normal; Abdomen: Soft, Nontender, +suprapubic area softly distended, Normal bowel sounds; Genitourinary: No CVA tenderness; Extremities: Pulses normal, No tenderness, No edema, No calf edema or asymmetry.; Neuro: AA&Ox3, Major CN grossly intact.  Speech clear. No gross focal motor or sensory deficits in extremities.; Skin: Color normal, Warm, Dry.   ED Course  Procedures   EKG Interpretation   None       MDM  MDM Reviewed: previous chart, nursing note and vitals Interpretation: labs     Results for orders placed during the hospital encounter of 07/22/13   URINALYSIS W MICROSCOPIC + REFLEX CULTURE      Result Value Range   Color, Urine YELLOW  YELLOW   APPearance CLEAR  CLEAR   Specific Gravity, Urine 1.011  1.005 - 1.030   pH 6.5  5.0 - 8.0   Glucose, UA NEGATIVE  NEGATIVE mg/dL   Hgb urine dipstick TRACE (*) NEGATIVE   Bilirubin Urine NEGATIVE  NEGATIVE   Ketones, ur NEGATIVE  NEGATIVE mg/dL   Protein, ur NEGATIVE  NEGATIVE mg/dL   Urobilinogen, UA 0.2  0.0 - 1.0 mg/dL   Nitrite NEGATIVE  NEGATIVE   Leukocytes, UA  NEGATIVE  NEGATIVE   WBC, UA 0-2  <3 WBC/hpf   RBC / HPF 0-2  <3 RBC/hpf   Squamous Epithelial / LPF RARE  RARE     0840:  Pt with long hx of intermittent urinary retention. Foley placed on arrival with significant relief of suprapubic discomfort. VSS, abd soft/NT, clear/yellow urine in leg bag. Pt states he has to leave now to go to work.  Dx and testing d/w pt.  Questions answered.  Verb understanding, agreeable to d/c home with outpt f/u.     Laray Anger, DO 07/24/13 [phone]

## 2013-07-22 NOTE — ED Notes (Signed)
PT reports urinary retention, pt reports he last voided last night around 8pm. Pt reports a hx of this happening three times in the past month. Pt has an appt with the urologist on Dec 23. Pt reports he is unable to void and is in a lot of pain because he cannot void. Pt also reports that he cannot control his bowels when this occurs. Pt is A&O X4.

## 2013-08-22 ENCOUNTER — Telehealth: Payer: Self-pay | Admitting: *Deleted

## 2013-08-22 NOTE — Telephone Encounter (Signed)
Per Dr. Beryle Beams; notified pt's daughter [name]-- no need to keep this appt on Mon 08/26/13.  Blood counts are stable; pt can follow-up with his PCP or re-schedule when he gets back on his feet.  Pt's daughter verbalized understanding and expressed appreciation for call back.

## 2013-08-22 NOTE — Telephone Encounter (Signed)
Received call from Levada Dy (ROI on file) asking "is the appt for 08/26/13 necessary, my father is on such a tight budget"  Pt's daughter states that he got his prostate out 2 weeks ago and Dr. Brendia Sacks said it was the "slow growing kind of ca; no indication of it being anywhere else"  Note to Dr. Beryle Beams.

## 2013-08-26 ENCOUNTER — Encounter: Payer: Self-pay | Admitting: Oncology

## 2013-08-26 ENCOUNTER — Other Ambulatory Visit: Payer: Medicare Other

## 2013-08-26 ENCOUNTER — Ambulatory Visit: Payer: Medicare Other | Admitting: Oncology

## 2013-08-26 NOTE — Progress Notes (Signed)
73 year old man evaluated here in July 2014 for mild anemia, leukopenia, and monocytosis. Likely early myelodysplastic syndrome. Asymptomatic. Wife called [place] a few days ago to report he is recuperating from recent prostate cancer surgery and she did not think he could make his appointment today. He can be followed by his primary care physician. We can reevaluate him if his counts deteriorate in the future.

## 2013-10-05 ENCOUNTER — Encounter: Payer: Self-pay | Admitting: *Deleted

## 2013-11-03 ENCOUNTER — Other Ambulatory Visit: Payer: Self-pay | Admitting: Hematology and Oncology

## 2013-11-03 ENCOUNTER — Encounter: Payer: Self-pay | Admitting: Hematology and Oncology

## 2013-11-03 DIAGNOSIS — D649 Anemia, unspecified: Secondary | ICD-10-CM

## 2013-11-03 DIAGNOSIS — E538 Deficiency of other specified B group vitamins: Secondary | ICD-10-CM

## 2013-11-03 DIAGNOSIS — D72819 Decreased white blood cell count, unspecified: Secondary | ICD-10-CM

## 2013-11-03 HISTORY — DX: Deficiency of other specified B group vitamins: E53.8

## 2013-11-04 ENCOUNTER — Ambulatory Visit: Payer: Medicare Other | Admitting: Hematology and Oncology

## 2013-11-05 ENCOUNTER — Telehealth: Payer: Self-pay | Admitting: Hematology and Oncology

## 2013-11-05 ENCOUNTER — Other Ambulatory Visit: Payer: Self-pay | Admitting: *Deleted

## 2013-11-05 NOTE — Telephone Encounter (Signed)
S/w the pt and he is aware of his appt to see dr Alvy Bimler in may.

## 2013-12-09 ENCOUNTER — Ambulatory Visit: Payer: Medicare Other | Admitting: Hematology and Oncology

## 2013-12-11 ENCOUNTER — Ambulatory Visit: Payer: Medicare Other | Admitting: Hematology and Oncology

## 2014-07-09 ENCOUNTER — Ambulatory Visit: Payer: Medicare Other | Admitting: Oncology

## 2014-09-10 ENCOUNTER — Other Ambulatory Visit: Payer: Self-pay | Admitting: Internal Medicine

## 2014-09-10 DIAGNOSIS — R634 Abnormal weight loss: Secondary | ICD-10-CM

## 2014-09-12 ENCOUNTER — Ambulatory Visit
Admission: RE | Admit: 2014-09-12 | Discharge: 2014-09-12 | Disposition: A | Discharge status: Home / Self Care | Payer: Medicare HMO | Source: Ambulatory Visit | Attending: Internal Medicine | Admitting: Internal Medicine

## 2014-09-12 DIAGNOSIS — R634 Abnormal weight loss: Secondary | ICD-10-CM

## 2014-09-12 MED ORDER — IOHEXOL 300 MG/ML  SOLN
75.0000 mL | Freq: Once | INTRAMUSCULAR | Status: AC | PRN
  Administered 2014-09-12: 75 mL via INTRAVENOUS

## 2015-12-08 ENCOUNTER — Ambulatory Visit (INDEPENDENT_AMBULATORY_CARE_PROVIDER_SITE_OTHER): Payer: Medicare HMO | Admitting: Pulmonary Disease

## 2015-12-08 ENCOUNTER — Encounter: Payer: Self-pay | Admitting: Pulmonary Disease

## 2015-12-08 VITALS — BP 156/76 | HR 95 | Ht 73.0 in | Wt 189.0 lb

## 2015-12-08 DIAGNOSIS — R0600 Dyspnea, unspecified: Secondary | ICD-10-CM

## 2015-12-08 MED ORDER — UMECLIDINIUM-VILANTEROL 62.5-25 MCG/INH IN AEPB: 1.0000 | INHALATION_SPRAY | Freq: Every day | RESPIRATORY_TRACT | Status: DC

## 2015-12-08 NOTE — Patient Instructions (Signed)
You will give a sample of anoro elipta to try out You be scheduled for lung function test.  Return to clinic in 1 month

## 2015-12-14 NOTE — Progress Notes (Signed)
Subjective:    Patient ID: Cory Kemp, male    DOB: 1940/10/04, 75 y.o.   MRN: [id number]  HPI  Consult for evaluation of dyspnea.  Cory Kemp is a 75 year old chief complaint of dyspnea on exertion for the past 2 years. This is associated with cough, white mucus production, chest congestion, wheezing. He is a current 1 pack per day smoker. He is on albuterol which helps with her symptoms. He had been tried on various inhalers including Advair, Breo and Spiriva which did not help.  DATA: CT chest 09/12/14 Mild emphysematous changes noted bilaterally. Minimal posterior basilar scarring or subsegmental atelectasis is noted. No pulmonary mass or nodule is noted.  Social History: 1 pack per day current smoker. 50-pack-year smoking history. No alcohol, drug use. 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   Family History: Brother-capitis. Father, mother-lung cancer.  Past Medical History  Diagnosis Date  . Diabetes mellitus ([place]) 01/10/2012  . Arthritis   . Hyperlipidemia   . Hypertension   . Femoral hernia, right 03/27/2012  . Incisional hernias - swiss-cheese-type periumbilical [id number]  . Inguinal hernia - right 01/10/2012  . Glucose intolerance (impaired glucose tolerance)   . COPD (chronic obstructive pulmonary disease) ([place])   . Dementia     ?  [name] Kitchen Osteopenia   . Anemia   . Alcohol abuse   . Asthma   . Leukopenia 01/29/2013  . Monocytosis 01/29/2013  . Normochromic anemia 01/29/2013  . BPH (benign prostatic hypertrophy)   . B12 deficiency 11/03/2013    Current outpatient prescriptions:  .  albuterol (PROVENTIL) (2.5 MG/3ML) 0.083% nebulizer solution, Take 2.5 mg by nebulization every 6 (six) hours as needed. Wheezing and shortness of breath, Disp: , Rfl:  .  cholecalciferol (VITAMIN D) 1000 UNITS tablet, Take 1,000 Units by mouth daily. , Disp: , Rfl:  .  losartan (COZAAR) 50 MG tablet, Take 50 mg by mouth daily with breakfast. , Disp: , Rfl:  .  Naproxen Sodium  (ALEVE) 220 MG CAPS, Take 440 mg by mouth daily as needed (for pain). For pain, Disp: , Rfl:  .  Omega-3 Fatty Acids (FISH OIL PO), Take 1 capsule by mouth daily., Disp: , Rfl:  .  simvastatin (ZOCOR) 40 MG tablet, Take 40 mg by mouth at bedtime. , Disp: , Rfl:  .  vitamin B-12 (CYANOCOBALAMIN) 100 MCG tablet, Take 100 mcg by mouth daily., Disp: , Rfl:  .  umeclidinium-vilanterol (ANORO ELLIPTA) 62.5-25 MCG/INH AEPB, Inhale 1 puff into the lungs daily., Disp: 2 each, Rfl: 0  Review of Systems Dyspnea on exertion, cough, white mucus, chest congestion. No hemoptysis. No fevers, chills, loss of weight, loss of appetite. No nausea, vomiting, diarrhea, constipation. No chest pain, palpitation. All other review of systems are negative.    Objective:   Physical Exam Blood pressure 156/76, pulse 95, height 6\' 1"  (1.854 m), weight 189 lb (85.73 kg), SpO2 95 %. Gen: No apparent distress Neuro: No gross focal deficits. HEENT: No JVD, lymphadenopathy, thyromegaly. RS: Clear, No wheeze or crackles CVS: S1-S2 heard, no murmurs rubs gallops. Abdomen: Soft, positive bowel sounds. Musculoskeletal: No edema.    Assessment & Plan:  Dyspnea on exertion. COPD Cory Kemp continues to smoke in spite of attempts to quit. His chief complaint is dyspnea on exertion and he had not tolerated many of the inhalers in the past. He just continues on albuterol which helps with his symptoms. I stressed the importance of quitting but is  not ready to do so yet.  I will evaluate the getting a full set of pulmonary function tests and give him a sample of Anoro Elipta to try out. If this works he will call us back for a prescription.  Plan: - Trial Anoro elipta - PFTs  Return in 1 month to assess response.  [name] MD [facility] Pager [phone] If no answer or after 3pm call: [phone] 12/14/2015, 10:00 AM

## 2015-12-29 ENCOUNTER — Encounter (HOSPITAL_COMMUNITY): Payer: Self-pay | Admitting: Adult Health

## 2015-12-29 ENCOUNTER — Emergency Department (HOSPITAL_COMMUNITY): Payer: Medicare HMO

## 2015-12-29 DIAGNOSIS — I10 Essential (primary) hypertension: Secondary | ICD-10-CM | POA: Diagnosis not present

## 2015-12-29 DIAGNOSIS — E119 Type 2 diabetes mellitus without complications: Secondary | ICD-10-CM | POA: Diagnosis not present

## 2015-12-29 DIAGNOSIS — Z8719 Personal history of other diseases of the digestive system: Secondary | ICD-10-CM | POA: Insufficient documentation

## 2015-12-29 DIAGNOSIS — F17210 Nicotine dependence, cigarettes, uncomplicated: Secondary | ICD-10-CM | POA: Insufficient documentation

## 2015-12-29 DIAGNOSIS — M1990 Unspecified osteoarthritis, unspecified site: Secondary | ICD-10-CM | POA: Insufficient documentation

## 2015-12-29 DIAGNOSIS — Z87438 Personal history of other diseases of male genital organs: Secondary | ICD-10-CM | POA: Diagnosis not present

## 2015-12-29 DIAGNOSIS — E785 Hyperlipidemia, unspecified: Secondary | ICD-10-CM | POA: Insufficient documentation

## 2015-12-29 DIAGNOSIS — Z79899 Other long term (current) drug therapy: Secondary | ICD-10-CM | POA: Diagnosis not present

## 2015-12-29 DIAGNOSIS — E538 Deficiency of other specified B group vitamins: Secondary | ICD-10-CM | POA: Diagnosis not present

## 2015-12-29 DIAGNOSIS — D649 Anemia, unspecified: Secondary | ICD-10-CM | POA: Diagnosis not present

## 2015-12-29 DIAGNOSIS — J441 Chronic obstructive pulmonary disease with (acute) exacerbation: Secondary | ICD-10-CM | POA: Diagnosis not present

## 2015-12-29 DIAGNOSIS — R0602 Shortness of breath: Secondary | ICD-10-CM | POA: Diagnosis present

## 2015-12-29 LAB — CBC
HEMATOCRIT: 40.8 % (ref 39.0–52.0)
Hemoglobin: 13.1 g/dL (ref 13.0–17.0)
MCH: 28.8 pg (ref 26.0–34.0)
MCHC: 32.1 g/dL (ref 30.0–36.0)
MCV: 89.7 fL (ref 78.0–100.0)
Platelets: 143 10*3/uL — ABNORMAL LOW (ref 150–400)
RBC: 4.55 MIL/uL (ref 4.22–5.81)
RDW: 14.1 % (ref 11.5–15.5)
WBC: 2.8 10*3/uL — ABNORMAL LOW (ref 4.0–10.5)

## 2015-12-29 LAB — BASIC METABOLIC PANEL
Anion gap: 10 (ref 5–15)
BUN: 15 mg/dL (ref 6–20)
CO2: 25 mmol/L (ref 22–32)
Calcium: 9.5 mg/dL (ref 8.9–10.3)
Chloride: 107 mmol/L (ref 101–111)
Creatinine, Ser: 0.96 mg/dL (ref 0.61–1.24)
GFR calc Af Amer: >60 mL/min (ref >60)
GFR calc non Af Amer: >60 mL/min (ref >60)
GLUCOSE: 89 mg/dL (ref 65–99)
POTASSIUM: 3.8 mmol/L (ref 3.5–5.1)
Sodium: 142 mmol/L (ref 135–145)

## 2015-12-29 NOTE — ED Notes (Signed)
Presents with fever of 99.9 at home today, increased use of inhaler, worsening SOB and cough-generalized weakness and fatigue, he ahs been having theses symptoms for a few days but today was worse. Endorses worsening SOB with exertion. Breath sounds with prolonged exhalation and rhonchi.

## 2015-12-30 ENCOUNTER — Emergency Department (HOSPITAL_COMMUNITY)
Admission: EM | Admit: 2015-12-30 | Discharge: 2015-12-30 | Disposition: A | Discharge status: Home / Self Care | Payer: Medicare HMO | Attending: Emergency Medicine | Admitting: Emergency Medicine

## 2015-12-30 DIAGNOSIS — R0602 Shortness of breath: Secondary | ICD-10-CM

## 2015-12-30 MED ORDER — PREDNISONE 20 MG PO TABS
60.0000 mg | ORAL_TABLET | Freq: Once | ORAL | Status: AC
  Administered 2015-12-30: 60 mg via ORAL
  Filled 2015-12-30: qty 3

## 2015-12-30 MED ORDER — IPRATROPIUM-ALBUTEROL 0.5-2.5 (3) MG/3ML IN SOLN
3.0000 mL | RESPIRATORY_TRACT | Status: DC
  Administered 2015-12-30: 3 mL via RESPIRATORY_TRACT
  Filled 2015-12-30: qty 3

## 2015-12-30 MED ORDER — PREDNISONE 20 MG PO TABS: 60.0000 mg | ORAL_TABLET | Freq: Every day | ORAL | Status: DC

## 2015-12-30 NOTE — ED Provider Notes (Signed)
History  By signing my name below, I, Bea Graff, attest that this documentation has been prepared under the direction and in the presence of Everlene Balls, MD. Electronically Signed: Bea Graff, ED Scribe. 12/30/2015. 1:36 AM.  Chief Complaint  Patient presents with  . Shortness of Breath   The history is provided by the patient and medical records. No language interpreter was used.    HPI Comments:  [name] is a 75 y.o. obese male with PMHx of COPD who presents to the Emergency Department complaining of SOB that began yesterday. He states it really has been going on for years.  He reports associated productive cough of phlegm and subjective fever. He states he always has difficulty breathing when it rains. He reports using his albuterol nebulizer more frequently today with no significant relief of his symptoms. He denies any modifying factors other than the weather. He denies current pain. He states he has an appt with [facility] next month.    Past Medical History  Diagnosis Date  . Diabetes mellitus ([place]) 01/10/2012  . Arthritis   . Hyperlipidemia   . Hypertension   . Femoral hernia, right 03/27/2012  . Incisional hernias - swiss-cheese-type periumbilical [id number]  . Inguinal hernia - right 01/10/2012  . Glucose intolerance (impaired glucose tolerance)   . COPD (chronic obstructive pulmonary disease) ([place])   . Dementia     ?  [name] Kitchen Osteopenia   . Anemia   . Alcohol abuse   . Asthma   . Leukopenia 01/29/2013  . Monocytosis 01/29/2013  . Normochromic anemia 01/29/2013  . BPH (benign prostatic hypertrophy)   . B12 deficiency 11/03/2013   Past Surgical History  Procedure Laterality Date  . Hemorrhoid surgery    . Tumor excision      intestinal after SBO ( Benign), appendectomy  . Hernia repair    . Ventral hernia repair  02/24/2012    Procedure: LAPAROSCOPIC VENTRAL HERNIA;  Surgeon: Adin Hector, MD;  Location: [facility];  Service: General;  Laterality: N/A;   lysis of adhesions, excision of abdominal wall lipomae  . Inguinal hernia repair  02/24/2012    Procedure: LAPAROSCOPIC INGUINAL HERNIA;  Surgeon: Adin Hector, MD;  Location: [facility];  Service: General;  Laterality: N/A;   Family History  Problem Relation Age of Onset  . Lung cancer Father   . Lung cancer Mother   . Diabetes Brother    Social History  Substance Use Topics  . Smoking status: Current Every Day Smoker -- 1.00 packs/day    Types: Cigarettes    Start date: 08/09/1955  . Smokeless tobacco: Never Used  . Alcohol Use: No    Review of Systems A complete 10 system review of systems was obtained and all systems are negative except as noted in the HPI and PMH.   Allergies  Adhesive  Home Medications   Prior to Admission medications   Medication Sig Start Date End Date Taking? Authorizing Provider  albuterol (PROVENTIL) (2.5 MG/3ML) 0.083% nebulizer solution Take 2.5 mg by nebulization every 6 (six) hours as needed. Wheezing and shortness of breath   Yes Historical Provider, MD  cholecalciferol (VITAMIN D) 1000 UNITS tablet Take 1,000 Units by mouth daily.    Yes Historical Provider, MD  losartan (COZAAR) 50 MG tablet Take 50 mg by mouth daily with breakfast.  12/26/11  Yes Historical Provider, MD  Naproxen Sodium (ALEVE) 220 MG CAPS Take 440 mg by mouth daily as needed (for pain). For pain  Yes Historical Provider, MD  Omega-3 Fatty Acids (FISH OIL PO) Take 1 capsule by mouth daily.   Yes Historical Provider, MD  simvastatin (ZOCOR) 40 MG tablet Take 40 mg by mouth at bedtime.  12/27/11  Yes Historical Provider, MD  umeclidinium-vilanterol (ANORO ELLIPTA) 62.5-25 MCG/INH AEPB Inhale 1 puff into the lungs daily. 12/08/15  Yes Praveen Mannam, MD  vitamin B-12 (CYANOCOBALAMIN) 100 MCG tablet Take 100 mcg by mouth daily.   Yes Historical Provider, MD   Triage Vitals: BP 160/91 mmHg  Pulse 95  Temp(Src) 98.2 F (36.8 C) (Oral)  Resp 18  SpO2 99% Physical Exam   Constitutional: He is oriented to person, place, and time. Vital signs are normal. He appears well-developed and well-nourished.  Non-toxic appearance. He does not appear ill. No distress.  HENT:  Head: Normocephalic and atraumatic.  Nose: Nose normal.  Mouth/Throat: Oropharynx is clear and moist. No oropharyngeal exudate.  Eyes: Conjunctivae and EOM are normal. Pupils are equal, round, and reactive to light. No scleral icterus.  Neck: Normal range of motion. Neck supple. No tracheal deviation, no edema, no erythema and normal range of motion present. No thyroid mass and no thyromegaly present.  Cardiovascular: Normal rate, regular rhythm, S1 normal, S2 normal, normal heart sounds, intact distal pulses and normal pulses.  Exam reveals no gallop and no friction rub.   No murmur heard. Pulmonary/Chest: Effort normal. No respiratory distress. He has no wheezes. He has no rhonchi. He has no rales.  Adventitious breath sounds at bilateral bases.  Abdominal: Soft. Normal appearance and bowel sounds are normal. He exhibits no distension, no ascites and no mass. There is no hepatosplenomegaly. There is no tenderness. There is no rebound, no guarding and no CVA tenderness.  Musculoskeletal: Normal range of motion. He exhibits no edema or tenderness.  Lymphadenopathy:    He has no cervical adenopathy.  Neurological: He is alert and oriented to person, place, and time. He has normal strength. No cranial nerve deficit or sensory deficit.  Skin: Skin is warm, dry and intact. No petechiae and no rash noted. He is not diaphoretic. No erythema. No pallor.  Psychiatric: He has a normal mood and affect. His behavior is normal. Judgment normal.  Nursing note and vitals reviewed.   ED Course  Procedures (including critical care time) DIAGNOSTIC STUDIES: Oxygen Saturation is 99% on RA, normal by my interpretation.   COORDINATION OF CARE: 1:35 AM- Will order nebulizer treatment and steroids. Pt verbalizes  understanding and agrees to plan.  Medications  ipratropium-albuterol (DUONEB) 0.5-2.5 (3) MG/3ML nebulizer solution 3 mL (not administered)  predniSONE (DELTASONE) tablet 60 mg (not administered)    Labs Review Labs Reviewed  CBC - Abnormal; Notable for the following:    WBC 2.8 (*)    Platelets 143 (*)    All other components within normal limits  BASIC METABOLIC PANEL    Imaging Review Dg Chest 2 View  12/29/2015  CLINICAL DATA:  Shortness of breath. EXAM: CHEST  2 VIEW COMPARISON:  CT T5 16 FINDINGS: The lungs are hyperinflated. There is bullous change at the right lung base. Chronic bronchial thickening. Heart size is normal, atherosclerosis of the aortic arch. Mediastinal contours are unchanged. No consolidation, pleural effusion or pneumothorax. No evidence of pulmonary mass. No osseous abnormality. IMPRESSION: Chronic hyperinflation, emphysema, and bronchial thickening. No superimposed acute process. Electronically Signed   By: Jeb Levering M.D.   On: 12/29/2015 23:30   I have personally reviewed and evaluated these images and  lab results as part of my medical decision-making.   EKG Interpretation   Date/Time:  Tuesday Dec 29 2015 22:49:23 EDT Ventricular Rate:  110 PR Interval:  188 QRS Duration: 96 QT Interval:  340 QTC Calculation: 460 R Axis:   85 Text Interpretation:  Sinus tachycardia with frequent Premature  ventricular complexes Anteroseptal infarct , age undetermined Abnormal ECG  No significant change since last tracing Confirmed by Glynn Octave [phone]) on 12/30/2015 1:10:02 AM      MDM   Final diagnoses:  None   Patient presents to the ED for SOB for years.  He states he has gotten sick of it now.  No change in his cough and he denies fevers.  CXR neg for pneumonia.  O2 sat is normal in the ED.  He was given duoneb and will be placed on prednisone for 5 days.  Advised to keep pulmonologist appt for further testing. He appears well and in  NAD. VS remain within his normal limits and he is safe for DC.   I personally performed the services described in this documentation, which was scribed in my presence. The recorded information has been reviewed and is accurate.       Everlene Balls, MD 12/30/15 [phone]

## 2015-12-30 NOTE — Discharge Instructions (Signed)
Shortness of Breath Mr. Yax, take steroids for the next 4 days for continued treatment.  See your pulmonolgist within 3 days for close follow up. If symptoms worsen, come back to the ED immediately. Thank you. Shortness of breath means you have trouble breathing. Shortness of breath needs medical care right away. HOME CARE   Do not smoke.  Avoid being around chemicals or things (paint fumes, dust) that may bother your breathing.  Rest as needed. Slowly begin your normal activities.  Only take medicines as told by your doctor.  Keep all doctor visits as told. GET HELP RIGHT AWAY IF:   Your shortness of breath gets worse.  You feel lightheaded, pass out (faint), or have a cough that is not helped by medicine.  You cough up blood.  You have pain with breathing.  You have pain in your chest, arms, shoulders, or belly (abdomen).  You have a fever.  You cannot walk up stairs or exercise the way you normally do.  You do not get better in the time expected.  You have a hard time doing normal activities even with rest.  You have problems with your medicines.  You have any new symptoms. MAKE SURE YOU:  Understand these instructions.  Will watch your condition.  Will get help right away if you are not doing well or get worse.   This information is not intended to replace advice given to you by your health care provider. Make sure you discuss any questions you have with your health care provider.   Document Released: 01/11/2008 Document Revised: 07/30/2013 Document Reviewed: 10/10/2011 Elsevier Interactive Patient Education [employer].

## 2015-12-30 NOTE — ED Notes (Signed)
Placed Pt on 2L of O2 for comfort. Pt sats 93%. After O2 pt sats now 95%

## 2016-01-01 ENCOUNTER — Encounter: Payer: Self-pay | Admitting: Adult Health

## 2016-01-01 ENCOUNTER — Ambulatory Visit (INDEPENDENT_AMBULATORY_CARE_PROVIDER_SITE_OTHER): Payer: Medicare HMO | Admitting: Adult Health

## 2016-01-01 VITALS — BP 126/68 | HR 100 | Temp 97.8°F | Ht 73.0 in | Wt 195.0 lb

## 2016-01-01 DIAGNOSIS — J441 Chronic obstructive pulmonary disease with (acute) exacerbation: Secondary | ICD-10-CM

## 2016-01-01 MED ORDER — PREDNISONE 10 MG PO TABS: ORAL_TABLET | ORAL | Status: DC

## 2016-01-01 MED ORDER — ALBUTEROL SULFATE HFA 108 (90 BASE) MCG/ACT IN AERS: 2.0000 | INHALATION_SPRAY | RESPIRATORY_TRACT | Status: DC | PRN

## 2016-01-01 MED ORDER — UMECLIDINIUM-VILANTEROL 62.5-25 MCG/INH IN AEPB: 1.0000 | INHALATION_SPRAY | Freq: Every day | RESPIRATORY_TRACT | Status: DC

## 2016-01-01 NOTE — Patient Instructions (Signed)
Restart ANORO 1 puff daily  Prednisone taper over next week.  Stop smoking . Saline nasal spray As needed   Mucinex DM Twice daily  As needed  Cough/congestion  follow up Dr. Vaughan Browner as planed in 2 weeks with PFT  Please contact office for sooner follow up if symptoms do not improve or worsen or seek emergency care

## 2016-01-01 NOTE — Progress Notes (Signed)
Subjective:    Patient ID: Cory Kemp, male    DOB: 05-Feb-1941, 75 y.o.   MRN: [id number]  HPI 75 yo male smoker with COPD   01/01/2016 ER follow up  Pt returns for ER follow up . Seen in ER on 5/24 for COPD flare . Tx w/ neg. Given prednisone 60mg  daily x 3 days  He is feeling better , but still has cough and wheezing  CXR was neg for acute process .  Was seen for pulmonary consult 5/2 , started on ANORO. PFT are pending .  Feels ANORO is helping but ran out of inhaler .  Still smoking , discussed smoking cessation.  Previous CT chest 09/2014 with emphysema changes.  Denies chest pain, orthopnea, edema or fever.    Past Medical History  Diagnosis Date  . Diabetes mellitus ([place]) 01/10/2012  . Arthritis   . Hyperlipidemia   . Hypertension   . Femoral hernia, right 03/27/2012  . Incisional hernias - swiss-cheese-type periumbilical [id number]  . Inguinal hernia - right 01/10/2012  . Glucose intolerance (impaired glucose tolerance)   . COPD (chronic obstructive pulmonary disease) ([place])   . Dementia     ?  [name] Kitchen Osteopenia   . Anemia   . Alcohol abuse   . Asthma   . Leukopenia 01/29/2013  . Monocytosis 01/29/2013  . Normochromic anemia 01/29/2013  . BPH (benign prostatic hypertrophy)   . B12 deficiency 11/03/2013   . Current Outpatient Prescriptions on File Prior to Visit  Medication Sig Dispense Refill  . albuterol (PROVENTIL) (2.5 MG/3ML) 0.083% nebulizer solution Take 2.5 mg by nebulization every 6 (six) hours as needed. Wheezing and shortness of breath    . cholecalciferol (VITAMIN D) 1000 UNITS tablet Take 1,000 Units by mouth daily.     [name] Kitchen losartan (COZAAR) 50 MG tablet Take 50 mg by mouth daily with breakfast.     . Naproxen Sodium (ALEVE) 220 MG CAPS Take 440 mg by mouth daily as needed (for pain). For pain    . Omega-3 Fatty Acids (FISH OIL PO) Take 1 capsule by mouth daily.    . predniSONE (DELTASONE) 20 MG tablet Take 3 tablets (60 mg total) by mouth daily. 12 tablet 0  .  simvastatin (ZOCOR) 40 MG tablet Take 40 mg by mouth at bedtime.     [name] Kitchen umeclidinium-vilanterol (ANORO ELLIPTA) 62.5-25 MCG/INH AEPB Inhale 1 puff into the lungs daily. 2 each 0  . vitamin B-12 (CYANOCOBALAMIN) 100 MCG tablet Take 100 mcg by mouth daily.     No current facility-administered medications on file prior to visit.      Review of Systems Constitutional:   No  weight loss, night sweats,  Fevers, chills,  +fatigue, or  lassitude.  HEENT:   No headaches,  Difficulty swallowing,  Tooth/dental problems, or  Sore throat,                No sneezing, itching, ear ache, nasal congestion, post nasal drip,   CV:  No chest pain,  Orthopnea, PND, swelling in lower extremities, anasarca, dizziness, palpitations, syncope.   GI  No heartburn, indigestion, abdominal pain, nausea, vomiting, diarrhea, change in bowel habits, loss of appetite, bloody stools.   Resp: .  No chest wall deformity  Skin: no rash or lesions.  GU: no dysuria, change in color of urine, no urgency or frequency.  No flank pain, no hematuria   MS:  No joint pain or swelling.  No decreased range of motion.  No back pain.  Psych:  No change in mood or affect. No depression or anxiety.  No memory loss.         Objective:   Physical Exam Filed Vitals:   01/01/16 1609  BP: 126/68  Pulse: 100  Temp: 97.8 F (36.6 C)  TempSrc: Oral  Height: 6\' 1"  (1.854 m)  Weight: 195 lb (88.451 kg)  SpO2: 94%     .GEN: A/Ox3; pleasant , NAD, elderly   HEENT:  [place]/AT,  EACs-clear, TMs-wnl, NOSE-clear, THROAT-clear, no lesions, no postnasal drip or exudate noted.   NECK:  Supple w/ fair ROM; no JVD; normal carotid impulses w/o bruits; no thyromegaly or nodules palpated; no lymphadenopathy.  RESP  Decreased BS in bases , no accessory muscle use, no dullness to percussion  CARD:  RRR, no m/r/g  , no peripheral edema, pulses intact, no cyanosis or clubbing.  GI:   Soft & nt; nml bowel sounds; no organomegaly or masses  detected.  Musco: Warm bil, no deformities or joint swelling noted.   Neuro: alert, no focal deficits noted.    Skin: Warm, no lesions or rashes  Tammy Parrett NP-C  [facility]  01/01/2016       Assessment & Plan:

## 2016-01-11 DIAGNOSIS — J449 Chronic obstructive pulmonary disease, unspecified: Secondary | ICD-10-CM | POA: Insufficient documentation

## 2016-01-11 NOTE — Assessment & Plan Note (Signed)
Exacerbation in smoker   Plan  Restart ANORO 1 puff daily  Prednisone taper over next week.  Stop smoking . Saline nasal spray As needed   Mucinex DM Twice daily  As needed  Cough/congestion  follow up Dr. Vaughan Browner as planed in 2 weeks with PFT  Please contact office for sooner follow up if symptoms do not improve or worsen or seek emergency care

## 2016-01-16 ENCOUNTER — Emergency Department (HOSPITAL_COMMUNITY)
Admission: EM | Admit: 2016-01-16 | Discharge: 2016-01-16 | Disposition: A | Discharge status: Home / Self Care | Payer: Medicare HMO | Attending: Emergency Medicine | Admitting: Emergency Medicine

## 2016-01-16 ENCOUNTER — Encounter (HOSPITAL_COMMUNITY): Payer: Self-pay

## 2016-01-16 DIAGNOSIS — Z79899 Other long term (current) drug therapy: Secondary | ICD-10-CM | POA: Insufficient documentation

## 2016-01-16 DIAGNOSIS — F17210 Nicotine dependence, cigarettes, uncomplicated: Secondary | ICD-10-CM | POA: Diagnosis not present

## 2016-01-16 DIAGNOSIS — J449 Chronic obstructive pulmonary disease, unspecified: Secondary | ICD-10-CM | POA: Insufficient documentation

## 2016-01-16 DIAGNOSIS — Z791 Long term (current) use of non-steroidal anti-inflammatories (NSAID): Secondary | ICD-10-CM | POA: Diagnosis not present

## 2016-01-16 DIAGNOSIS — I10 Essential (primary) hypertension: Secondary | ICD-10-CM | POA: Diagnosis not present

## 2016-01-16 DIAGNOSIS — R0981 Nasal congestion: Secondary | ICD-10-CM

## 2016-01-16 DIAGNOSIS — Z9109 Other allergy status, other than to drugs and biological substances: Secondary | ICD-10-CM

## 2016-01-16 DIAGNOSIS — E119 Type 2 diabetes mellitus without complications: Secondary | ICD-10-CM | POA: Insufficient documentation

## 2016-01-16 DIAGNOSIS — T7840XA Allergy, unspecified, initial encounter: Secondary | ICD-10-CM | POA: Diagnosis not present

## 2016-01-16 DIAGNOSIS — E785 Hyperlipidemia, unspecified: Secondary | ICD-10-CM | POA: Diagnosis not present

## 2016-01-16 MED ORDER — LORATADINE 10 MG PO TABS: 10.0000 mg | ORAL_TABLET | Freq: Every day | ORAL | Status: DC

## 2016-01-16 MED ORDER — MOMETASONE FUROATE 50 MCG/ACT NA SUSP: 4.0000 | Freq: Every day | NASAL | Status: DC

## 2016-01-16 NOTE — ED Notes (Signed)
EDP at bedside  

## 2016-01-16 NOTE — ED Provider Notes (Signed)
CSN: [id number]     Arrival date & time 01/16/16  0247 History   First MD Initiated Contact with Patient 01/16/16 0415     Chief Complaint  Patient presents with  . Sore Throat  . Cough     (Consider location/radiation/quality/duration/timing/severity/associated sxs/prior Treatment) HPI Comments: Patient is a 75 year old male with a history of diabetes, hypertension, COPD who presents today with ongoing issues of nasal congestion, sore throat, productive cough. He states this is been going on for months. He has been seen in the emergency room and by pulmonology with a diagnosis of COPD and has changed inhalers as well as started on a new medication and recently finished a prednisone taper. Patient states the prednisone did not seem to help his symptoms and he has been trying to take over-the-counter congestion medication. Symptoms do seem to be worse when he is in [place] exposed to when he travels to [place]. He states that he is outside quite a bit and has also been feeding stray cats and has been around cats for the first time in quite some time.  He denies any shortness of breath but notes the cough and nasal congestion are worse with lying down so symptoms always seem to be worse at night. He denies any wheezing at this time.  Patient is a 75 y.o. male presenting with pharyngitis and cough. The history is provided by the patient.  Sore Throat  Cough   Past Medical History  Diagnosis Date  . Diabetes mellitus ([place]) 01/10/2012  . Arthritis   . Hyperlipidemia   . Hypertension   . Femoral hernia, right 03/27/2012  . Incisional hernias - swiss-cheese-type periumbilical [id number]  . Inguinal hernia - right 01/10/2012  . Glucose intolerance (impaired glucose tolerance)   . COPD (chronic obstructive pulmonary disease) ([place])   . Dementia     ?  [name] Kitchen Osteopenia   . Anemia   . Alcohol abuse   . Asthma   . Leukopenia 01/29/2013  . Monocytosis 01/29/2013  . Normochromic anemia 01/29/2013  . BPH  (benign prostatic hypertrophy)   . B12 deficiency 11/03/2013   Past Surgical History  Procedure Laterality Date  . Hemorrhoid surgery    . Tumor excision      intestinal after SBO ( Benign), appendectomy  . Hernia repair    . Ventral hernia repair  02/24/2012    Procedure: LAPAROSCOPIC VENTRAL HERNIA;  Surgeon: Adin Hector, MD;  Location: [facility];  Service: General;  Laterality: N/A;  lysis of adhesions, excision of abdominal wall lipomae  . Inguinal hernia repair  02/24/2012    Procedure: LAPAROSCOPIC INGUINAL HERNIA;  Surgeon: Adin Hector, MD;  Location: [facility];  Service: General;  Laterality: N/A;   Family History  Problem Relation Age of Onset  . Lung cancer Father   . Lung cancer Mother   . Diabetes Brother    Social History  Substance Use Topics  . Smoking status: Current Every Day Smoker -- 1.00 packs/day    Types: Cigarettes    Start date: 08/09/1955  . Smokeless tobacco: Never Used  . Alcohol Use: No    Review of Systems  Respiratory: Positive for cough.   All other systems reviewed and are negative.     Allergies  Adhesive  Home Medications   Prior to Admission medications   Medication Sig Start Date End Date Taking? Authorizing Provider  albuterol (PROAIR HFA) 108 (90 Base) MCG/ACT inhaler Inhale 2 puffs into the lungs every 4 (four) hours  as needed for wheezing or shortness of breath. 01/01/16   Tammy S Parrett, NP  albuterol (PROVENTIL) (2.5 MG/3ML) 0.083% nebulizer solution Take 2.5 mg by nebulization every 6 (six) hours as needed. Wheezing and shortness of breath    Historical Provider, MD  cholecalciferol (VITAMIN D) 1000 UNITS tablet Take 1,000 Units by mouth daily.     Historical Provider, MD  loratadine (CLARITIN) 10 MG tablet Take 1 tablet (10 mg total) by mouth daily. 01/16/16   Blanchie Dessert, MD  losartan (COZAAR) 50 MG tablet Take 50 mg by mouth daily with breakfast.  12/26/11   Historical Provider, MD  mometasone (NASONEX) 50 MCG/ACT nasal  spray Place 4 sprays into the nose daily. 01/16/16   Blanchie Dessert, MD  Naproxen Sodium (ALEVE) 220 MG CAPS Take 440 mg by mouth daily as needed (for pain). For pain    Historical Provider, MD  Omega-3 Fatty Acids (FISH OIL PO) Take 1 capsule by mouth daily.    Historical Provider, MD  predniSONE (DELTASONE) 10 MG tablet 4 tabs for 2 days, then 3 tabs for 2 days, 2 tabs for 2 days, then 1 tab for 2 days, then stop 01/01/16   Tammy S Parrett, NP  predniSONE (DELTASONE) 20 MG tablet Take 3 tablets (60 mg total) by mouth daily. 12/30/15   Everlene Balls, MD  simvastatin (ZOCOR) 40 MG tablet Take 40 mg by mouth at bedtime.  12/27/11   Historical Provider, MD  umeclidinium-vilanterol (ANORO ELLIPTA) 62.5-25 MCG/INH AEPB Inhale 1 puff into the lungs daily. 01/01/16   Tammy S Parrett, NP  vitamin B-12 (CYANOCOBALAMIN) 100 MCG tablet Take 100 mcg by mouth daily.    Historical Provider, MD   BP 147/53 mmHg  Pulse 60  Temp(Src) 97.4 F (36.3 C) (Oral)  Resp 17  SpO2 96% Physical Exam  Constitutional: He is oriented to person, place, and time. He appears well-developed and well-nourished. No distress.  HENT:  Head: Normocephalic and atraumatic.  Right Ear: Tympanic membrane normal.  Left Ear: Tympanic membrane normal.  Nose: Mucosal edema and rhinorrhea present.  Mouth/Throat: Oropharynx is clear and moist. No oropharyngeal exudate or posterior oropharyngeal edema.  Cobblestoning in the back of the posterior pharynx. Nasally mildly hoarse voice  Eyes: Conjunctivae and EOM are normal. Pupils are equal, round, and reactive to light.  Neck: Normal range of motion. Neck supple.  Cardiovascular: Normal rate, regular rhythm and intact distal pulses.   No murmur heard. Pulmonary/Chest: Effort normal and breath sounds normal. No respiratory distress. He has no wheezes. He has no rales.  Abdominal: Soft. He exhibits no distension. There is no tenderness. There is no rebound and no guarding.  Musculoskeletal:  Normal range of motion. He exhibits no edema or tenderness.  Neurological: He is alert and oriented to person, place, and time.  Skin: Skin is warm and dry. No rash noted. No erythema.  Psychiatric: He has a normal mood and affect. His behavior is normal.  Nursing note and vitals reviewed.   ED Course  Procedures (including critical care time) Labs Review Labs Reviewed - No data to display  Imaging Review No results found. I have personally reviewed and evaluated these images and lab results as part of my medical decision-making.   EKG Interpretation None      MDM   Final diagnoses:  Nasal congestion  Environmental allergies    Patient is a 75 year old male presenting today with ongoing nasal congestion, drainage in his throat and productive cough. Patient has been  seen in the emergency room and by pulmonology and they're currently adjusting medication for his COPD however he states today it is not an issue of shortness of breath and wheezing but of drainage and mucus. Patient is concerned that he may have allergies to pollen work. He does not currently take any nasal spray other than [place] nasal spray and does not take an allergy medication. He states his symptoms are much better when he is in [place]. He currently has no wheezing on exam, normal oxygen saturation and is in no acute distress. Symptoms sound like they could be allergic and he was started on nasal and Claritin. He was instructed to follow-up with his PCP and given the number of an allergist.   Blanchie Dessert, MD 01/16/16 [phone]

## 2016-01-16 NOTE — ED Notes (Signed)
Pt complaining of sore throat and cough x several months. Pt states coughing up clear sputum. Denies any fever.

## 2016-01-16 NOTE — ED Notes (Signed)
Pt states being followed by [facility]. Scheduled for a "lung function test" on 01/21/16.

## 2016-01-16 NOTE — Discharge Instructions (Signed)
Allergies °An allergy is when your body reacts to a substance in a way that is not normal. An allergic reaction can happen after you: °· Eat something. °· Breathe in something. °· Touch something. °WHAT KINDS OF ALLERGIES ARE THERE? °You can be allergic to: °· Things that are only around during certain seasons, like molds and pollens. °· Foods. °· Drugs. °· Insects. °· Animal dander. °WHAT ARE SYMPTOMS OF ALLERGIES? °· Puffiness (swelling). This may happen on the lips, face, tongue, mouth, or throat. °· Sneezing. °· Coughing. °· Breathing loudly (wheezing). °· Stuffy nose. °· Tingling in the mouth. °· A rash. °· Itching. °· Itchy, red, puffy areas of skin (hives). °· Watery eyes. °· Throwing up (vomiting). °· Watery poop (diarrhea). °· Dizziness. °· Feeling faint or fainting. °· Trouble breathing or swallowing. °· A tight feeling in the chest. °· A fast heartbeat. °HOW ARE ALLERGIES DIAGNOSED? °Allergies can be diagnosed with: °· A medical and family history. °· Skin tests. °· Blood tests. °· A food diary. A food diary is a record of all the foods, drinks, and symptoms you have each day. °· The results of an elimination diet. This diet involves making sure not to eat certain foods and then seeing what happens when you start eating them again. °HOW ARE ALLERGIES TREATED? °There is no cure for allergies, but allergic reactions can be treated with medicine. Severe reactions usually need to be treated at a hospital.  °HOW CAN REACTIONS BE PREVENTED? °The best way to prevent an allergic reaction is to avoid the thing you are allergic to. Allergy shots and medicines can also help prevent reactions in some cases. °  °This information is not intended to replace advice given to you by your health care provider. Make sure you discuss any questions you have with your health care provider. °  °Document Released: 11/19/2012 Document Revised: 08/15/2014 Document Reviewed: 05/06/2014 °Elsevier Interactive Patient Education ©2016  Elsevier Inc. ° °

## 2016-01-21 ENCOUNTER — Ambulatory Visit (INDEPENDENT_AMBULATORY_CARE_PROVIDER_SITE_OTHER): Payer: Medicare HMO | Admitting: Pulmonary Disease

## 2016-01-21 ENCOUNTER — Encounter: Payer: Self-pay | Admitting: Pulmonary Disease

## 2016-01-21 VITALS — BP 140/80 | HR 86 | Ht 73.5 in | Wt 190.0 lb

## 2016-01-21 DIAGNOSIS — J441 Chronic obstructive pulmonary disease with (acute) exacerbation: Secondary | ICD-10-CM | POA: Diagnosis not present

## 2016-01-21 DIAGNOSIS — R0600 Dyspnea, unspecified: Secondary | ICD-10-CM

## 2016-01-21 LAB — PULMONARY FUNCTION TEST
DL/VA % pred: 64 %
DL/VA: 3.08 ml/min/mmHg/L
DLCO COR % PRED: 49 %
DLCO COR: 18.35 ml/min/mmHg
DLCO UNC: 17.92 ml/min/mmHg
DLCO unc % pred: 48 %
FEF 25-75 POST: 0.77 L/s
FEF 25-75 Pre: 0.58 L/sec
FEF2575-%Change-Post: 32 %
FEF2575-%PRED-PRE: 22 %
FEF2575-%Pred-Post: 29 %
FEV1-%Change-Post: 13 %
FEV1-%PRED-POST: 47 %
FEV1-%Pred-Pre: 41 %
FEV1-PRE: 1.47 L
FEV1-Post: 1.66 L
FEV1FVC-%Change-Post: 6 %
FEV1FVC-%PRED-PRE: 58 %
FEV6-%Change-Post: 8 %
FEV6-%PRED-POST: 76 %
FEV6-%PRED-PRE: 70 %
FEV6-POST: 3.48 L
FEV6-Pre: 3.21 L
FEV6FVC-%CHANGE-POST: 2 %
FEV6FVC-%PRED-POST: 101 %
FEV6FVC-%Pred-Pre: 99 %
FVC-%CHANGE-POST: 6 %
FVC-%PRED-POST: 75 %
FVC-%Pred-Pre: 71 %
FVC-Post: 3.65 L
FVC-Pre: 3.44 L
POST FEV6/FVC RATIO: 95 %
PRE FEV1/FVC RATIO: 43 %
Post FEV1/FVC ratio: 45 %
Pre FEV6/FVC Ratio: 93 %
RV % pred: 215 %
RV: 5.91 L
TLC % pred: 123 %
TLC: 9.57 L

## 2016-01-21 NOTE — Progress Notes (Signed)
PFT done today. 

## 2016-01-21 NOTE — Progress Notes (Addendum)
Subjective:    Patient ID: Cory Kemp, male    DOB: 1941/01/03, 75 y.o.   MRN: [id number]  PROBLEM LIST Severe COPD (FEV1 41%) Asthmatic bronchitis Active smoker  HPI  Cory Kemp is a 75 year old chief complaint of dyspnea on exertion for the past 2 years. This is associated with cough, white mucus production, chest congestion, wheezing. He is a current 1 pack per day smoker. He is on albuterol which helps with her symptoms. He had been tried on various inhalers including Advair, Breo and Spiriva which did not help.  DATA: CT chest 09/12/14 Mild emphysematous changes noted bilaterally. Minimal posterior basilar scarring or subsegmental atelectasis is noted. No pulmonary mass or nodule is noted  PFTs 01/21/16 FVC 2.44 (71%) FEV1 1.47 [41%) F/F 43 TLC 122% RV/TLC 166% DLCO 48% Severe obstruction with reduction in diffusion capacity. Hyperinflation with air trapping.  Social History: 1 pack per day current smoker. 50-pack-year smoking history. No alcohol, drug use. 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   Family History: Brother-capitis. Father, mother-lung cancer.  Past Medical History  Diagnosis Date  . Diabetes mellitus ([place]) 01/10/2012  . Arthritis   . Hyperlipidemia   . Hypertension   . Femoral hernia, right 03/27/2012  . Incisional hernias - swiss-cheese-type periumbilical [id number]  . Inguinal hernia - right 01/10/2012  . Glucose intolerance (impaired glucose tolerance)   . COPD (chronic obstructive pulmonary disease) ([place])   . Dementia     ?  [name] Kitchen Osteopenia   . Anemia   . Alcohol abuse   . Asthma   . Leukopenia 01/29/2013  . Monocytosis 01/29/2013  . Normochromic anemia 01/29/2013  . BPH (benign prostatic hypertrophy)   . B12 deficiency 11/03/2013    Current outpatient prescriptions:  .  albuterol (PROAIR HFA) 108 (90 Base) MCG/ACT inhaler, Inhale 2 puffs into the lungs every 4 (four) hours as needed for wheezing or shortness of breath., Disp: 1  Inhaler, Rfl: 5 .  albuterol (PROVENTIL) (2.5 MG/3ML) 0.083% nebulizer solution, Take 2.5 mg by nebulization every 6 (six) hours as needed. Wheezing and shortness of breath, Disp: , Rfl:  .  cholecalciferol (VITAMIN D) 1000 UNITS tablet, Take 1,000 Units by mouth daily. , Disp: , Rfl:  .  loratadine (CLARITIN) 10 MG tablet, Take 1 tablet (10 mg total) by mouth daily., Disp: 30 tablet, Rfl: 1 .  losartan (COZAAR) 50 MG tablet, Take 50 mg by mouth daily with breakfast. , Disp: , Rfl:  .  mometasone (NASONEX) 50 MCG/ACT nasal spray, Place 4 sprays into the nose daily., Disp: 17 g, Rfl: 1 .  Naproxen Sodium (ALEVE) 220 MG CAPS, Take 440 mg by mouth daily as needed (for pain). For pain, Disp: , Rfl:  .  Omega-3 Fatty Acids (FISH OIL PO), Take 1 capsule by mouth daily., Disp: , Rfl:  .  predniSONE (DELTASONE) 20 MG tablet, Take 3 tablets (60 mg total) by mouth daily., Disp: 12 tablet, Rfl: 0 .  simvastatin (ZOCOR) 40 MG tablet, Take 40 mg by mouth at bedtime. , Disp: , Rfl:  .  umeclidinium-vilanterol (ANORO ELLIPTA) 62.5-25 MCG/INH AEPB, Inhale 1 puff into the lungs daily., Disp: 1 each, Rfl: 5 .  vitamin B-12 (CYANOCOBALAMIN) 100 MCG tablet, Take 100 mcg by mouth daily., Disp: , Rfl:   Review of Systems Dyspnea on exertion, cough, white mucus, chest congestion. No hemoptysis. No fevers, chills, loss of weight, loss of appetite. No nausea, vomiting, diarrhea, constipation. No chest pain,  palpitation. All other review of systems are negative.    Objective:   Physical Exam Blood pressure 140/80, pulse 86, height 6' 1.5" (1.867 m), weight 190 lb (86.183 kg), SpO2 93 %. Gen: No apparent distress Neuro: No gross focal deficits. HEENT: No JVD, lymphadenopathy, thyromegaly. RS: Clear, No wheeze or crackles CVS: S1-S2 heard, no murmurs rubs gallops. Abdomen: Soft, positive bowel sounds. Musculoskeletal: No edema.    Assessment & Plan:  Asthma COPD overlap syndrome  His chief complaint is dyspnea  on exertion and he had not tolerated many of the inhalers in the past. He was seen twice last month for cough. COPD exacerbation > treated with prednisone. PFTs were reviewed. Although they do not meet the formal criteria he does have a big improvement in FEV1 post bronchodilator suggestive of an asthmatic component. He feels anoro is helping a little. I will switch this to a LABA/steroid combination. He is also due to be seen by allergy for further eval.  Cory Kemp continues to smoke in spite of attempts to quit. I discussed smoking cessation with him today. He is trying to quit without success.   Plan: - Change Anoro elipta to airduo - Smoking cessation - Follow up with allergy.  Return in 6 months  Cory Garfinkel MD [facility] Pager [phone] If no answer or after 3pm call: [phone] 01/21/2016, 3:07 PM

## 2016-01-21 NOTE — Patient Instructions (Addendum)
Continue the Anoro elipta The pulmonary function tests were reviewed with you today. They show severe obstructive disease. You're strongly encouraged to quit smoking.  Return to clinic in 6 months.

## 2016-02-29 ENCOUNTER — Telehealth: Payer: Self-pay | Admitting: Adult Health

## 2016-02-29 NOTE — Telephone Encounter (Signed)
Last ov with PM on 01/21/16 Instructions     Return in about 6 months (around 07/22/2016).  Continue the Anoro elipta The pulmonary function tests were reviewed with you today. They show severe obstructive disease. You're strongly encouraged to quit smoking.   Return to clinic in 6 months.   Called spoke with pt. He is requesting a handicap placard. He states that he will be out of town for a month but would like to make PM aware of this need. I explained to him that I would have to send a message to PM for approval before we could fill out the forms. He voiced understanding and had no further questions.  PM please advise

## 2016-03-04 NOTE — Telephone Encounter (Signed)
Handicap placard has been filled out and placed in PM's look at. Will route to [place] for follow up.

## 2016-03-04 NOTE — Telephone Encounter (Signed)
I am OK with filling out the paperwork for handicap placard

## 2016-03-16 NOTE — Telephone Encounter (Signed)
Keane Scrape has this form been signed by PM?

## 2016-03-16 NOTE — Telephone Encounter (Signed)
No, it has not.  PM has not been in the office. Called spoke with patient to discuss He is actually traveling out of state (in [place]) and will not return until the end of September.  Pt stated that he will call the office when he returns to get the placard.  Advised pt will sign off on note and we can re-open it when he calls again.  Pt voiced his understanding.  Nothing further needed; will sign off

## 2016-03-22 NOTE — Telephone Encounter (Signed)
Handicap placard was signed by PM Will hold in my to-do folder for when pt calls back

## 2016-04-08 ENCOUNTER — Telehealth: Payer: Self-pay | Admitting: Pulmonary Disease

## 2016-04-08 NOTE — Telephone Encounter (Signed)
Pt states he went to [place] last month and ended up at hospital there for low O2 levels. Pt would like to be seen here for follow up to discuss his O2 needs. Pt has been scheduled to see TP Tuesday 04-12-16 at 2:00pm.

## 2016-04-10 ENCOUNTER — Emergency Department (HOSPITAL_COMMUNITY): Payer: Medicare HMO

## 2016-04-10 ENCOUNTER — Observation Stay (HOSPITAL_COMMUNITY)
Admission: EM | Admit: 2016-04-10 | Discharge: 2016-04-11 | Disposition: A | Discharge status: Home / Self Care | Payer: Medicare HMO | Attending: Internal Medicine | Admitting: Internal Medicine

## 2016-04-10 ENCOUNTER — Encounter (HOSPITAL_COMMUNITY): Payer: Self-pay

## 2016-04-10 DIAGNOSIS — Z720 Tobacco use: Secondary | ICD-10-CM

## 2016-04-10 DIAGNOSIS — E538 Deficiency of other specified B group vitamins: Secondary | ICD-10-CM | POA: Diagnosis not present

## 2016-04-10 DIAGNOSIS — E785 Hyperlipidemia, unspecified: Secondary | ICD-10-CM | POA: Diagnosis not present

## 2016-04-10 DIAGNOSIS — I10 Essential (primary) hypertension: Secondary | ICD-10-CM | POA: Diagnosis not present

## 2016-04-10 DIAGNOSIS — Z87891 Personal history of nicotine dependence: Secondary | ICD-10-CM | POA: Diagnosis present

## 2016-04-10 DIAGNOSIS — Z79899 Other long term (current) drug therapy: Secondary | ICD-10-CM | POA: Diagnosis not present

## 2016-04-10 DIAGNOSIS — J441 Chronic obstructive pulmonary disease with (acute) exacerbation: Principal | ICD-10-CM | POA: Insufficient documentation

## 2016-04-10 DIAGNOSIS — F17210 Nicotine dependence, cigarettes, uncomplicated: Secondary | ICD-10-CM | POA: Insufficient documentation

## 2016-04-10 DIAGNOSIS — R000 Tachycardia, unspecified: Secondary | ICD-10-CM | POA: Diagnosis present

## 2016-04-10 DIAGNOSIS — R0602 Shortness of breath: Secondary | ICD-10-CM

## 2016-04-10 DIAGNOSIS — E119 Type 2 diabetes mellitus without complications: Secondary | ICD-10-CM | POA: Insufficient documentation

## 2016-04-10 DIAGNOSIS — J449 Chronic obstructive pulmonary disease, unspecified: Secondary | ICD-10-CM | POA: Diagnosis present

## 2016-04-10 LAB — CBC
HCT: 42 % (ref 39.0–52.0)
HEMOGLOBIN: 13 g/dL (ref 13.0–17.0)
MCH: 29.2 pg (ref 26.0–34.0)
MCHC: 31 g/dL (ref 30.0–36.0)
MCV: 94.4 fL (ref 78.0–100.0)
PLATELETS: 190 10*3/uL (ref 150–400)
RBC: 4.45 MIL/uL (ref 4.22–5.81)
RDW: 15.4 % (ref 11.5–15.5)
WBC: 11.1 10*3/uL — AB (ref 4.0–10.5)

## 2016-04-10 LAB — BASIC METABOLIC PANEL
ANION GAP: 7 (ref 5–15)
BUN: 21 mg/dL — ABNORMAL HIGH (ref 6–20)
CALCIUM: 8.9 mg/dL (ref 8.9–10.3)
CO2: 28 mmol/L (ref 22–32)
CREATININE: 0.95 mg/dL (ref 0.61–1.24)
Chloride: 106 mmol/L (ref 101–111)
Glucose, Bld: 196 mg/dL — ABNORMAL HIGH (ref 65–99)
Potassium: 3.5 mmol/L (ref 3.5–5.1)
SODIUM: 141 mmol/L (ref 135–145)

## 2016-04-10 LAB — HEPATIC FUNCTION PANEL
ALT: 19 U/L (ref 17–63)
AST: 20 U/L (ref 15–41)
Albumin: 3.6 g/dL (ref 3.5–5.0)
Alkaline Phosphatase: 65 U/L (ref 38–126)
TOTAL PROTEIN: 6.1 g/dL — AB (ref 6.5–8.1)
Total Bilirubin: 0.5 mg/dL (ref 0.3–1.2)

## 2016-04-10 LAB — TROPONIN I: Troponin I: <0.03 ng/mL (ref <0.03)

## 2016-04-10 LAB — BRAIN NATRIURETIC PEPTIDE: B NATRIURETIC PEPTIDE 5: 106.9 pg/mL — AB (ref 0.0–100.0)

## 2016-04-10 MED ORDER — IPRATROPIUM BROMIDE 0.02 % IN SOLN
1.0000 mg | Freq: Once | RESPIRATORY_TRACT | Status: AC
  Administered 2016-04-10: 1 mg via RESPIRATORY_TRACT
  Filled 2016-04-10: qty 5

## 2016-04-10 MED ORDER — LEVOFLOXACIN 750 MG PO TABS
750.0000 mg | ORAL_TABLET | Freq: Once | ORAL | Status: AC
  Administered 2016-04-10: 750 mg via ORAL
  Filled 2016-04-10: qty 1

## 2016-04-10 MED ORDER — IOPAMIDOL (ISOVUE-370) INJECTION 76%
INTRAVENOUS | Status: AC
  Administered 2016-04-10: 80 mL
  Filled 2016-04-10: qty 100

## 2016-04-10 MED ORDER — METHYLPREDNISOLONE SODIUM SUCC 125 MG IJ SOLR
125.0000 mg | Freq: Once | INTRAMUSCULAR | Status: AC
  Administered 2016-04-10: 125 mg via INTRAVENOUS
  Filled 2016-04-10: qty 2

## 2016-04-10 MED ORDER — ALBUTEROL SULFATE (2.5 MG/3ML) 0.083% IN NEBU
5.0000 mg | INHALATION_SOLUTION | Freq: Once | RESPIRATORY_TRACT | Status: AC
  Administered 2016-04-10: 5 mg via RESPIRATORY_TRACT

## 2016-04-10 MED ORDER — ALBUTEROL (5 MG/ML) CONTINUOUS INHALATION SOLN
10.0000 mg/h | INHALATION_SOLUTION | Freq: Once | RESPIRATORY_TRACT | Status: AC
  Administered 2016-04-10: 10 mg/h via RESPIRATORY_TRACT
  Filled 2016-04-10: qty 20

## 2016-04-10 MED ORDER — IPRATROPIUM-ALBUTEROL 0.5-2.5 (3) MG/3ML IN SOLN
3.0000 mL | Freq: Once | RESPIRATORY_TRACT | Status: DC
  Filled 2016-04-10: qty 3

## 2016-04-10 NOTE — H&P (Addendum)
History and Physical    [name] [id number] DOB: 06-28-1941 DOA: 04/10/2016  Referring MD/NP/PA: Dr. Ellender Hose PCP: Jani Gravel, MD  Patient coming from: Home  Chief Complaint: Shortness of breath  HPI: Cory Kemp is a 75 y.o. male with medical history significant of COPD, not on oxygen, tobacco abuse, and diet-controlled diabetes mellitus type 2; who presents with complaints of shortness of breath. Symptoms started over the last 2 weeks. He notes that he's been traveling in his car across the [place] to [place] and selling [place] [employer]. 4 days ago while in [place] he noted that he had worsening of his shortness of breath. He tried using his albuterol inhaler multiple times without relief of symptoms. Reports also utilizing nebulized albuterol and ipratropium without relief. Mr. Nissim was previously on Anoro for some short duration in  time, but states that he did not continue this medicine secondary to cost. He felt that the shortness of breath was likely due to him being [place],000 feet above sea level. He was seen at the emergency department where he reports that his O2 saturations on room air were noted to be 74%. He was treated with breathing treatments with improvement of symptoms. They felt that he needed oxygen, but did not have the supplies at that time. Ultimately, he was discharged and notes driving immediately to a place in [place] that was below sea level, however his symptoms did not improve and he was seen again at the emergency department. He continues to smoke, but states that he is ready to quit at this time. He has smoked since he was a teenager intermittently quitting. Reports smoking 2 ppd on average but had recently reduced down to just 1ppd.   He already has appointment scheduled to see Rexene Edison, NP at his pulmonologist office on 9/5 at 2 PM.   ED Course: Upon admission into the emergency department patient was seen to be afebrile with  pulse up to 129, respirations up to 31, blood pressure as high as 192/98, and O2 saturations 90-98% on room air. Lab work revealed CBC 11.1, BUN 21, Cr 0.95, and all other lab work was relatively within normal limits. CT angiogram was checked given recent travel history and negative for any signs of PE. Patient was given 1 hour long continuous albuterol treatment, Solu-Medrol 125 mg IV, and second albuterol breathing treatment with improvement in symptoms. [facility] called to admit.  Review of Systems: As per HPI otherwise 10 point review of systems negative.   Past Medical History:  Diagnosis Date  . Alcohol abuse   . Anemia   . Arthritis   . Asthma   . B12 deficiency 11/03/2013  . BPH (benign prostatic hypertrophy)   . COPD (chronic obstructive pulmonary disease) ([place])   . Dementia    ?  . Diabetes mellitus ([place]) 01/10/2012  . Femoral hernia, right 03/27/2012  . Glucose intolerance (impaired glucose tolerance)   . Hyperlipidemia   . Hypertension   . Incisional hernias - swiss-cheese-type periumbilical [id number]  . Inguinal hernia - right 01/10/2012  . Leukopenia 01/29/2013  . Monocytosis 01/29/2013  . Normochromic anemia 01/29/2013  . Osteopenia     Past Surgical History:  Procedure Laterality Date  . HEMORRHOID SURGERY    . HERNIA REPAIR    . INGUINAL HERNIA REPAIR  02/24/2012   Procedure: LAPAROSCOPIC INGUINAL HERNIA;  Surgeon: Adin Hector, MD;  Location: [facility];  Service: General;  Laterality: N/A;  . TUMOR EXCISION  intestinal after SBO ( Benign), appendectomy  . VENTRAL HERNIA REPAIR  02/24/2012   Procedure: LAPAROSCOPIC VENTRAL HERNIA;  Surgeon: Adin Hector, MD;  Location: [facility];  Service: General;  Laterality: N/A;  lysis of adhesions, excision of abdominal wall lipomae     reports that he has been smoking Cigarettes.  He started smoking about 60 years ago. He has been smoking about 1.00 pack per day. He has never used smokeless tobacco. He reports that he does not drink  alcohol or use drugs.  Allergies  Allergen Reactions  . Adhesive [Tape] Rash    Family History  Problem Relation Age of Onset  . Lung cancer Father   . Lung cancer Mother   . Diabetes Brother     Prior to Admission medications   Medication Sig Start Date End Date Taking? Authorizing Provider  albuterol (PROAIR HFA) 108 (90 Base) MCG/ACT inhaler Inhale 2 puffs into the lungs every 4 (four) hours as needed for wheezing or shortness of breath. 01/01/16  Yes Tammy S Parrett, NP  albuterol (PROVENTIL) (2.5 MG/3ML) 0.083% nebulizer solution Take 2.5 mg by nebulization every 6 (six) hours as needed. Wheezing and shortness of breath   Yes Historical Provider, MD  cholecalciferol (VITAMIN D) 1000 UNITS tablet Take 1,000 Units by mouth daily.    Yes Historical Provider, MD  loratadine (CLARITIN) 10 MG tablet Take 1 tablet (10 mg total) by mouth daily. Patient taking differently: Take 10 mg by mouth daily as needed for allergies.  01/16/16  Yes Blanchie Dessert, MD  losartan (COZAAR) 50 MG tablet Take 50 mg by mouth daily with breakfast.  12/26/11  Yes Historical Provider, MD  mometasone (NASONEX) 50 MCG/ACT nasal spray Place 4 sprays into the nose daily. 01/16/16  Yes Blanchie Dessert, MD  Naproxen Sodium (ALEVE) 220 MG CAPS Take 440 mg by mouth daily as needed (for pain). For pain   Yes Historical Provider, MD  Omega-3 Fatty Acids (FISH OIL PO) Take 1 capsule by mouth daily.   Yes Historical Provider, MD  sildenafil (REVATIO) 20 MG tablet Take 20-100 mg by mouth daily as needed (ED).   Yes Historical Provider, MD  simvastatin (ZOCOR) 40 MG tablet Take 40 mg by mouth at bedtime.  12/27/11  Yes Historical Provider, MD  umeclidinium-vilanterol (ANORO ELLIPTA) 62.5-25 MCG/INH AEPB Inhale 1 puff into the lungs daily. 01/01/16  Yes Tammy S Parrett, NP  vitamin B-12 (CYANOCOBALAMIN) 100 MCG tablet Take 100 mcg by mouth daily.   Yes Historical Provider, MD    Physical Exam:    Constitutional: Elderly  male who appears to be chronically ill and uncomfortable sitting up in bed. Vitals:   04/10/16 1945 04/10/16 2145 04/10/16 2151 04/10/16 2300  BP: 192/98 177/100 177/100 164/76  Pulse: 99 105 101 113  Resp: (!) 27 (!) 27 18   Temp:      TempSrc:      SpO2: 98% 90% 95% 91%   Eyes: PERRL, lids and conjunctivae normal ENMT: Mucous membranes are moist. Posterior pharynx clear of any exudate or lesions. Poor dentition.  Neck: normal, supple, no masses, no thyromegaly Respiratory: Mildly tachypneic, with intermittent wheezing appreciated, no crackles. No accessory muscle use.  Cardiovascular: Tachycardic, no murmurs / rubs / gallops. No extremity edema. 2+ pedal pulses. No carotid bruits.  Abdomen: no tenderness, no masses palpated. No hepatosplenomegaly. Bowel sounds positive.  Musculoskeletal: no clubbing / cyanosis. No joint deformity upper and lower extremities. Good ROM, no contractures. Normal muscle tone.  Skin:  no rashes, lesions, ulcers. No induration Neurologic: CN 2-12 grossly intact. Sensation intact, DTR normal. Strength 5/5 in all 4.  Psychiatric: Normal judgment and insight. Alert and oriented x 3. Normal mood.     Labs on Admission: I have personally reviewed following labs and imaging studies  CBC:  Recent Labs Lab 04/10/16 1747  WBC 11.1*  HGB 13.0  HCT 42.0  MCV 94.4  PLT 99991111   Basic Metabolic Panel:  Recent Labs Lab 04/10/16 1747  NA 141  K 3.5  CL 106  CO2 28  GLUCOSE 196*  BUN 21*  CREATININE 0.95  CALCIUM 8.9   GFR: CrCl cannot be calculated (Unknown ideal weight.). Liver Function Tests:  Recent Labs Lab 04/10/16 1838  AST 20  ALT 19  ALKPHOS 65  BILITOT 0.5  PROT 6.1*  ALBUMIN 3.6   No results for input(s): LIPASE, AMYLASE in the last 168 hours. No results for input(s): AMMONIA in the last 168 hours. Coagulation Profile: No results for input(s): INR, PROTIME in the last 168 hours. Cardiac Enzymes:  Recent Labs Lab 04/10/16 1707   TROPONINI <0.03   BNP (last 3 results) No results for input(s): PROBNP in the last 8760 hours. HbA1C: No results for input(s): HGBA1C in the last 72 hours. CBG: No results for input(s): GLUCAP in the last 168 hours. Lipid Profile: No results for input(s): CHOL, HDL, LDLCALC, TRIG, CHOLHDL, LDLDIRECT in the last 72 hours. Thyroid Function Tests: No results for input(s): TSH, T4TOTAL, FREET4, T3FREE, THYROIDAB in the last 72 hours. Anemia Panel: No results for input(s): VITAMINB12, FOLATE, FERRITIN, TIBC, IRON, RETICCTPCT in the last 72 hours. Urine analysis:    Component Value Date/Time   COLORURINE YELLOW 07/22/2013 0725   APPEARANCEUR CLEAR 07/22/2013 0725   LABSPEC 1.011 07/22/2013 0725   PHURINE 6.5 07/22/2013 0725   GLUCOSEU NEGATIVE 07/22/2013 0725   HGBUR TRACE (A) 07/22/2013 0725   BILIRUBINUR NEGATIVE 07/22/2013 0725   KETONESUR NEGATIVE 07/22/2013 0725   PROTEINUR NEGATIVE 07/22/2013 0725   UROBILINOGEN 0.2 07/22/2013 0725   NITRITE NEGATIVE 07/22/2013 0725   LEUKOCYTESUR NEGATIVE 07/22/2013 0725   Sepsis Labs: No results found for this or any previous visit (from the past 240 hour(s)).   Radiological Exams on Admission: Dg Chest 2 View  Result Date: 04/10/2016 CLINICAL DATA:  Worsening shortness of breath for 3 weeks. COPD. Chronic smoker. EXAM: CHEST  2 VIEW COMPARISON:  12/29/2015 FINDINGS: Heart size is within normal limits.  Aortic atherosclerosis. Severe pulmonary hyperinflation and central peribronchial thickening are unchanged and consistent with COPD. Mild scarring seen in left lung base. No evidence of acute infiltrate or edema. No evidence of pneumothorax or pleural effusion. IMPRESSION: COPD.  No active lung disease. Aortic atherosclerosis. Electronically Signed   By: Earle Gell M.D.   On: 04/10/2016 18:55   Ct Angio Chest Pe W Or Wo Contrast  Result Date: 04/10/2016 CLINICAL DATA:  Intermittent chest pain shortness of breath for 5 years increasing in  past couple days, hypertension, diabetes mellitus, COPD, asthma, smoker, dementia EXAM: CT ANGIOGRAPHY CHEST WITH CONTRAST TECHNIQUE: Multidetector CT imaging of the chest was performed using the standard protocol during bolus administration of intravenous contrast. Multiplanar CT image reconstructions and MIPs were obtained to evaluate the vascular anatomy. CONTRAST:  80 cc Isovue 370 IV COMPARISON:  09/12/2014 FINDINGS: Cardiovascular: Atherosclerotic calcifications aorta, coronary arteries and proximal great vessels. Aorta normal caliber without aneurysm or dissection. No pericardial effusion. Question LEFT ventricular hypertrophy. Pulmonary arteries adequately opacified and patent.  No evidence of pulmonary embolism. Mediastinum/Nodes: Few scattered normal sized mediastinal lymph nodes. No thoracic adenopathy. Esophagus unremarkable. Base of cervical region normal appearance Lungs/Pleura: Emphysematous changes consistent with COPD. Peribronchial thickening diffusely. Subsegmental atelectasis at both lower lobes. No pulmonary infiltrate, pleural effusion or pneumothorax. Upper Abdomen: Cyst at upper pole RIGHT kidney 4.8 x 4.8 cm image 125. Small cyst upper pole LEFT kidney. Remaining visualized upper abdomen unremarkable. Musculoskeletal: No acute osseous findings. Review of the MIP images confirms the above findings. IMPRESSION: No evidence of pulmonary embolism. Aortic atherosclerosis and coronary arterial calcification. Question LEFT ventricular hypertrophy. Emphysematous and chronic bronchitic changes compatible with COPD with peribronchial thickening and subsegmental atelectasis in the lower lobes increased since previous exam. Renal cysts. Electronically Signed   By: Lavonia Dana M.D.   On: 04/10/2016 21:32    EKG: Independently reviewed. Sinus tachycardia with PACs  Assessment/Plan Dyspnea COPD/ exacerbation (HCC) - Continuous pulse oximetry with nasal cannula oxygen to keep O2 saturations 92% of -  Albuterol and ipratropium nebs q4 hr prn - Budesonide and brovana nebs q12hrs  - Prednisone 60 mg - Mucinex - will need to address barriers with costs and maintenance medicine as previously on Anoro - may warrant Need a 6 minute walk  Leukocytosis: WBC 11.1 on admission suspect this is likely reactive. -Follow-up repeat CBC  Sinus tachycardia:  - Continue to monitor  Essential hypertension  - Continue losartan   Diabetes mellitus type 2: Patient reports that this is diet controlled at this time. - Continue to monitor and start insulin regimen if needed   Hyperlipidemia - Continue Zocor   Tobacco abuse: Patient states that he wanted to quit cold [place] - Counseled on the need of cessation of tobacco abuse  DVT prophylaxis: Lovenox  Code Status: Full Family Communication:  No family at bedside Disposition Plan: Possible discharge home in 1-2 days Consults called: None Admission status: Observation telemetry  Norval Morton MD [facility] Pager [phone]  If 7PM-7AM, please contact night-coverage [email or web address] Password [facility]  04/10/2016, 11:33 PM

## 2016-04-10 NOTE — ED Notes (Signed)
Pt transported to xray 

## 2016-04-10 NOTE — ED Notes (Signed)
MD at bedside. 

## 2016-04-10 NOTE — ED Notes (Signed)
Respiratory notified of hour long nebulization order

## 2016-04-10 NOTE — ED Notes (Signed)
Pt reports he has an inhaler with a spacer but states it hasn't helped

## 2016-04-10 NOTE — ED Triage Notes (Signed)
Patient complains of intermittent episodes of shortness of breath for the past 2 weeks. States that he thinks related to his COPD. No CP and speaking full sentences. NAD

## 2016-04-10 NOTE — ED Notes (Signed)
Helped pt to bathroom ambulated without assistance

## 2016-04-10 NOTE — ED Provider Notes (Signed)
[facility] DEPT Provider Note   CSN: [id number] Arrival date & time: 04/10/16  1653     History   Chief Complaint Chief Complaint  Patient presents with  . Shortness of Breath    HPI Cory Kemp is a 75 y.o. male.  The history is provided by the patient.  Shortness of Breath  This is a recurrent problem. The average episode lasts 1 week. The problem occurs continuously.The current episode started more than 2 days ago. The problem has been gradually worsening. Associated symptoms include cough, sputum production and wheezing. Pertinent negatives include no fever, no headaches, no rhinorrhea, no swollen glands, no neck pain, no chest pain, no vomiting, no abdominal pain, no rash and no leg swelling. The problem's precipitants include weather/humidity. Risk factors include smoking. He has tried beta-agonist inhalers for the symptoms. The treatment provided mild relief. He has had prior ED visits. Associated medical issues include COPD.    Past Medical History:  Diagnosis Date  . Alcohol abuse   . Anemia   . Arthritis   . Asthma   . B12 deficiency 11/03/2013  . BPH (benign prostatic hypertrophy)   . COPD (chronic obstructive pulmonary disease) ([place])   . Dementia    ?  . Diabetes mellitus ([place]) 01/10/2012  . Femoral hernia, right 03/27/2012  . Glucose intolerance (impaired glucose tolerance)   . Hyperlipidemia   . Hypertension   . Incisional hernias - swiss-cheese-type periumbilical [id number]  . Inguinal hernia - right 01/10/2012  . Leukopenia 01/29/2013  . Monocytosis 01/29/2013  . Normochromic anemia 01/29/2013  . Osteopenia     Patient Active Problem List   Diagnosis Date Noted  . COPD exacerbation ([place]) 01/11/2016  . B12 deficiency 11/03/2013  . Leukopenia 01/29/2013  . Monocytosis 01/29/2013  . Normochromic anemia 01/29/2013  . Tobacco abuse 03/27/2012  . Diabetes mellitus ([place]) 01/10/2012    Past Surgical History:  Procedure Laterality Date  . HEMORRHOID  SURGERY    . HERNIA REPAIR    . INGUINAL HERNIA REPAIR  02/24/2012   Procedure: LAPAROSCOPIC INGUINAL HERNIA;  Surgeon: Adin Hector, MD;  Location: [facility] ORS;  Service: General;  Laterality: N/A;  . TUMOR EXCISION     intestinal after SBO ( Benign), appendectomy  . VENTRAL HERNIA REPAIR  02/24/2012   Procedure: LAPAROSCOPIC VENTRAL HERNIA;  Surgeon: Adin Hector, MD;  Location: [facility];  Service: General;  Laterality: N/A;  lysis of adhesions, excision of abdominal wall lipomae       Home Medications    Prior to Admission medications   Medication Sig Start Date End Date Taking? Authorizing Provider  albuterol (PROAIR HFA) 108 (90 Base) MCG/ACT inhaler Inhale 2 puffs into the lungs every 4 (four) hours as needed for wheezing or shortness of breath. 01/01/16  Yes Tammy S Parrett, NP  albuterol (PROVENTIL) (2.5 MG/3ML) 0.083% nebulizer solution Take 2.5 mg by nebulization every 6 (six) hours as needed. Wheezing and shortness of breath   Yes Historical Provider, MD  cholecalciferol (VITAMIN D) 1000 UNITS tablet Take 1,000 Units by mouth daily.    Yes Historical Provider, MD  loratadine (CLARITIN) 10 MG tablet Take 1 tablet (10 mg total) by mouth daily. Patient taking differently: Take 10 mg by mouth daily as needed for allergies.  01/16/16  Yes Blanchie Dessert, MD  losartan (COZAAR) 50 MG tablet Take 50 mg by mouth daily with breakfast.  12/26/11  Yes Historical Provider, MD  mometasone (NASONEX) 50 MCG/ACT nasal spray Place 4 sprays  into the nose daily. 01/16/16  Yes Blanchie Dessert, MD  Naproxen Sodium (ALEVE) 220 MG CAPS Take 440 mg by mouth daily as needed (for pain). For pain   Yes Historical Provider, MD  Omega-3 Fatty Acids (FISH OIL PO) Take 1 capsule by mouth daily.   Yes Historical Provider, MD  sildenafil (REVATIO) 20 MG tablet Take 20-100 mg by mouth daily as needed (ED).   Yes Historical Provider, MD  simvastatin (ZOCOR) 40 MG tablet Take 40 mg by mouth at bedtime.  12/27/11  Yes  Historical Provider, MD  umeclidinium-vilanterol (ANORO ELLIPTA) 62.5-25 MCG/INH AEPB Inhale 1 puff into the lungs daily. 01/01/16  Yes Tammy S Parrett, NP  vitamin B-12 (CYANOCOBALAMIN) 100 MCG tablet Take 100 mcg by mouth daily.   Yes Historical Provider, MD    Family History Family History  Problem Relation Age of Onset  . Lung cancer Father   . Lung cancer Mother   . Diabetes Brother     Social History Social History  Substance Use Topics  . Smoking status: Current Every Day Smoker    Packs/day: 1.00    Types: Cigarettes    Start date: 08/09/1955  . Smokeless tobacco: Never Used  . Alcohol use No     Allergies   Adhesive [tape]   Review of Systems Review of Systems  Constitutional: Positive for fatigue. Negative for chills and fever.  HENT: Negative for congestion and rhinorrhea.   Eyes: Negative for visual disturbance.  Respiratory: Positive for cough, sputum production, shortness of breath and wheezing.   Cardiovascular: Negative for chest pain and leg swelling.  Gastrointestinal: Negative for abdominal pain, diarrhea, nausea and vomiting.  Genitourinary: Negative for dysuria and flank pain.  Musculoskeletal: Negative for neck pain and neck stiffness.  Skin: Negative for rash and wound.  Allergic/Immunologic: Negative for immunocompromised state.  Neurological: Negative for syncope, weakness and headaches.  All other systems reviewed and are negative.    Physical Exam Updated Vital Signs BP (!) 168/68 (BP Location: Right Arm)   Pulse (!) 108   Temp 97.8 F (36.6 C) (Oral)   Resp 18   Ht 6\' 1"  (1.854 m)   Wt 189 lb 6.4 oz (85.9 kg)   SpO2 93%   BMI 24.99 kg/m   Physical Exam  Constitutional: He is oriented to person, place, and time. He appears well-developed and well-nourished. He appears distressed.  HENT:  Head: Normocephalic and atraumatic.  Eyes: Conjunctivae are normal.  Neck: Neck supple.  Cardiovascular: Normal rate, regular rhythm and normal  heart sounds.  Exam reveals no friction rub.   No murmur heard. Pulmonary/Chest: Accessory muscle usage present. Tachypnea noted. He is in respiratory distress. He has decreased breath sounds. He has wheezes in the right lower field and the left lower field. He has no rhonchi. He has no rales.  Abdominal: He exhibits no distension.  Musculoskeletal: He exhibits no edema.  Neurological: He is alert and oriented to person, place, and time. He exhibits normal muscle tone.  Skin: Skin is warm. Capillary refill takes less than 2 seconds.  Psychiatric: He has a normal mood and affect.  Nursing note and vitals reviewed.    ED Treatments / Results  Labs (all labs ordered are listed, but only abnormal results are displayed) Labs Reviewed  BASIC METABOLIC PANEL - Abnormal; Notable for the following:       Result Value   Glucose, Bld 196 (*)    BUN 21 (*)    All other components  within normal limits  CBC - Abnormal; Notable for the following:    WBC 11.1 (*)    All other components within normal limits  BRAIN NATRIURETIC PEPTIDE - Abnormal; Notable for the following:    B Natriuretic Peptide 106.9 (*)    All other components within normal limits  HEPATIC FUNCTION PANEL - Abnormal; Notable for the following:    Total Protein 6.1 (*)    Bilirubin, Direct <0.1 (*)    All other components within normal limits  TROPONIN I  CBC  BASIC METABOLIC PANEL    EKG  EKG Interpretation  Date/Time:  Sunday April 10 2016 16:55:28 EDT Ventricular Rate:  125 PR Interval:  172 QRS Duration: 80 QT Interval:  290 QTC Calculation: 418 R Axis:   89 Text Interpretation:  Sinus tachycardia with Premature atrial complexes with Abberant conduction Anteroseptal infarct , age undetermined Abnormal ECG Non-specific ST segment changes No significant change since last tracing Confirmed by Gearlene Godsil MD, Lysbeth Galas [phone]) on 04/10/2016 6:20:32 PM       Radiology Dg Chest 2 View  Result Date: 04/10/2016 CLINICAL  DATA:  Worsening shortness of breath for 3 weeks. COPD. Chronic smoker. EXAM: CHEST  2 VIEW COMPARISON:  12/29/2015 FINDINGS: Heart size is within normal limits.  Aortic atherosclerosis. Severe pulmonary hyperinflation and central peribronchial thickening are unchanged and consistent with COPD. Mild scarring seen in left lung base. No evidence of acute infiltrate or edema. No evidence of pneumothorax or pleural effusion. IMPRESSION: COPD.  No active lung disease. Aortic atherosclerosis. Electronically Signed   By: Earle Gell M.D.   On: 04/10/2016 18:55   Ct Angio Chest Pe W Or Wo Contrast  Result Date: 04/10/2016 CLINICAL DATA:  Intermittent chest pain shortness of breath for 5 years increasing in past couple days, hypertension, diabetes mellitus, COPD, asthma, smoker, dementia EXAM: CT ANGIOGRAPHY CHEST WITH CONTRAST TECHNIQUE: Multidetector CT imaging of the chest was performed using the standard protocol during bolus administration of intravenous contrast. Multiplanar CT image reconstructions and MIPs were obtained to evaluate the vascular anatomy. CONTRAST:  80 cc Isovue 370 IV COMPARISON:  09/12/2014 FINDINGS: Cardiovascular: Atherosclerotic calcifications aorta, coronary arteries and proximal great vessels. Aorta normal caliber without aneurysm or dissection. No pericardial effusion. Question LEFT ventricular hypertrophy. Pulmonary arteries adequately opacified and patent. No evidence of pulmonary embolism. Mediastinum/Nodes: Few scattered normal sized mediastinal lymph nodes. No thoracic adenopathy. Esophagus unremarkable. Base of cervical region normal appearance Lungs/Pleura: Emphysematous changes consistent with COPD. Peribronchial thickening diffusely. Subsegmental atelectasis at both lower lobes. No pulmonary infiltrate, pleural effusion or pneumothorax. Upper Abdomen: Cyst at upper pole RIGHT kidney 4.8 x 4.8 cm image 125. Small cyst upper pole LEFT kidney. Remaining visualized upper abdomen  unremarkable. Musculoskeletal: No acute osseous findings. Review of the MIP images confirms the above findings. IMPRESSION: No evidence of pulmonary embolism. Aortic atherosclerosis and coronary arterial calcification. Question LEFT ventricular hypertrophy. Emphysematous and chronic bronchitic changes compatible with COPD with peribronchial thickening and subsegmental atelectasis in the lower lobes increased since previous exam. Renal cysts. Electronically Signed   By: Lavonia Dana M.D.   On: 04/10/2016 21:32    Procedures Procedures (including critical care time)  Medications Ordered in ED Medications  loratadine (CLARITIN) tablet 10 mg (not administered)  fluticasone (FLONASE) 50 MCG/ACT nasal spray 2 spray (not administered)  simvastatin (ZOCOR) tablet 40 mg (not administered)  losartan (COZAAR) tablet 50 mg (not administered)  albuterol (PROVENTIL) (2.5 MG/3ML) 0.083% nebulizer solution 2.5 mg (not administered)  enoxaparin (LOVENOX) injection  40 mg (not administered)  0.9 %  sodium chloride infusion (not administered)  ondansetron (ZOFRAN) tablet 4 mg (not administered)    Or  ondansetron (ZOFRAN) injection 4 mg (not administered)  acetaminophen (TYLENOL) tablet 650 mg (not administered)    Or  acetaminophen (TYLENOL) suppository 650 mg (not administered)  ipratropium (ATROVENT) nebulizer solution 0.5 mg (not administered)  budesonide (PULMICORT) nebulizer solution 0.5 mg (not administered)  guaiFENesin (MUCINEX) 12 hr tablet 600 mg (not administered)  arformoterol (BROVANA) nebulizer solution 15 mcg (not administered)  insulin aspart (novoLOG) injection 0-9 Units (not administered)  predniSONE (DELTASONE) tablet 60 mg (not administered)  albuterol (PROVENTIL,VENTOLIN) solution continuous neb (10 mg/hr Nebulization Given 04/10/16 1934)  ipratropium (ATROVENT) nebulizer solution 1 mg (1 mg Nebulization Given 04/10/16 1934)  methylPREDNISolone sodium succinate (SOLU-MEDROL) 125 mg/2 mL  injection 125 mg (125 mg Intravenous Given 04/10/16 1923)  iopamidol (ISOVUE-370) 76 % injection (80 mLs  Contrast Given 04/10/16 2100)  albuterol (PROVENTIL) (2.5 MG/3ML) 0.083% nebulizer solution 5 mg (5 mg Nebulization Given 04/10/16 2150)  levofloxacin (LEVAQUIN) tablet 750 mg (750 mg Oral Given 04/10/16 2158)     Initial Impression / Assessment and Plan / ED Course  I have reviewed the triage vital signs and the nursing notes.  Pertinent labs & imaging results that were available during my care of the patient were reviewed by me and considered in my medical decision making (see chart for details).  Clinical Course   75 yo M with PMHx of COPD, HTN, HLD, who p/w a several week h/o progressively worsening SOB after travelling cross country. On arrival, pt tachycardic, tachypneic, with diminished BS bilaterally. He is overall non-toxic appearing, however. EKG is non-ischemic. Suspect acute COPD exacerbation, DDx includes CAP, also must consider PE given recent long-distance travel. No CP and ACS is less likely. Will check labs, imaging, reassess. Will give steroids, neb tx. No h/o CHF but this is also a consideration.  Labs, imaging as above. CBC with mild leukocytosis. BMP unremarkable. BNP minimally eleavted - doubt primary CHF and CXR shows no signs of fluid overload or PNA. CTA PE subsequently obtained given persistent tachycardia, hypoxia.   CTA PE negative. Breathing improved with neb treatment but he remains tachypneic. Will continue breathing tx, admit for COPD exacerbation. Levaquin given for sputum production and COPD exacerbation. Pt in agreement.   Final Clinical Impressions(s) / ED Diagnoses   Final diagnoses:  SOB (shortness of breath)  COPD exacerbation [facility])    New Prescriptions Current Discharge Medication List       Duffy Bruce, MD 04/11/16 [phone]

## 2016-04-10 NOTE — ED Notes (Signed)
Pt reports he has felt short of breath for the last couple years. Reports worse when he travelled to [place] in the high altitude where he had to be transported via ambulance to hospital.

## 2016-04-11 ENCOUNTER — Observation Stay (HOSPITAL_BASED_OUTPATIENT_CLINIC_OR_DEPARTMENT_OTHER): Payer: Medicare HMO

## 2016-04-11 DIAGNOSIS — I10 Essential (primary) hypertension: Secondary | ICD-10-CM | POA: Diagnosis not present

## 2016-04-11 DIAGNOSIS — E119 Type 2 diabetes mellitus without complications: Secondary | ICD-10-CM | POA: Diagnosis not present

## 2016-04-11 DIAGNOSIS — J441 Chronic obstructive pulmonary disease with (acute) exacerbation: Secondary | ICD-10-CM | POA: Diagnosis not present

## 2016-04-11 DIAGNOSIS — R000 Tachycardia, unspecified: Secondary | ICD-10-CM

## 2016-04-11 DIAGNOSIS — Z720 Tobacco use: Secondary | ICD-10-CM

## 2016-04-11 DIAGNOSIS — R0600 Dyspnea, unspecified: Secondary | ICD-10-CM | POA: Diagnosis not present

## 2016-04-11 LAB — BASIC METABOLIC PANEL
Anion gap: 7 (ref 5–15)
BUN: 18 mg/dL (ref 6–20)
CHLORIDE: 102 mmol/L (ref 101–111)
CO2: 30 mmol/L (ref 22–32)
CREATININE: 0.86 mg/dL (ref 0.61–1.24)
Calcium: 8.9 mg/dL (ref 8.9–10.3)
GFR calc Af Amer: >60 mL/min (ref >60)
GFR calc non Af Amer: >60 mL/min (ref >60)
Glucose, Bld: 190 mg/dL — ABNORMAL HIGH (ref 65–99)
POTASSIUM: 4.3 mmol/L (ref 3.5–5.1)
Sodium: 139 mmol/L (ref 135–145)

## 2016-04-11 LAB — CBC
HEMATOCRIT: 41 % (ref 39.0–52.0)
HEMOGLOBIN: 12.4 g/dL — AB (ref 13.0–17.0)
MCH: 28.3 pg (ref 26.0–34.0)
MCHC: 30.2 g/dL (ref 30.0–36.0)
MCV: 93.6 fL (ref 78.0–100.0)
Platelets: 174 10*3/uL (ref 150–400)
RBC: 4.38 MIL/uL (ref 4.22–5.81)
RDW: 15.3 % (ref 11.5–15.5)
WBC: 8.1 10*3/uL (ref 4.0–10.5)

## 2016-04-11 LAB — GLUCOSE, CAPILLARY
GLUCOSE-CAPILLARY: 115 mg/dL — AB (ref 65–99)
Glucose-Capillary: 121 mg/dL — ABNORMAL HIGH (ref 65–99)

## 2016-04-11 LAB — ECHOCARDIOGRAM COMPLETE
HEIGHTINCHES: 73 in
WEIGHTICAEL: 3030.4 [oz_av]

## 2016-04-11 MED ORDER — UMECLIDINIUM-VILANTEROL 62.5-25 MCG/INH IN AEPB: 1.0000 | INHALATION_SPRAY | Freq: Every day | RESPIRATORY_TRACT | Status: DC

## 2016-04-11 MED ORDER — ENOXAPARIN SODIUM 40 MG/0.4ML ~~LOC~~ SOLN
40.0000 mg | Freq: Every day | SUBCUTANEOUS | Status: DC
  Administered 2016-04-11: 40 mg via SUBCUTANEOUS
  Filled 2016-04-11: qty 0.4

## 2016-04-11 MED ORDER — IPRATROPIUM-ALBUTEROL 0.5-2.5 (3) MG/3ML IN SOLN
3.0000 mL | Freq: Four times a day (QID) | RESPIRATORY_TRACT | Status: DC
  Administered 2016-04-11 (×3): 3 mL via RESPIRATORY_TRACT
  Filled 2016-04-11 (×3): qty 3

## 2016-04-11 MED ORDER — ARFORMOTEROL TARTRATE 15 MCG/2ML IN NEBU: 15.0000 ug | INHALATION_SOLUTION | Freq: Two times a day (BID) | RESPIRATORY_TRACT | 3 refills | Status: DC

## 2016-04-11 MED ORDER — PREDNISONE 50 MG PO TABS
60.0000 mg | ORAL_TABLET | Freq: Every day | ORAL | Status: DC
  Administered 2016-04-11: 60 mg via ORAL
  Filled 2016-04-11: qty 1

## 2016-04-11 MED ORDER — SODIUM CHLORIDE 0.9 % IV SOLN
INTRAVENOUS | Status: DC
  Administered 2016-04-11: 02:00:00 via INTRAVENOUS

## 2016-04-11 MED ORDER — LOSARTAN POTASSIUM 50 MG PO TABS
50.0000 mg | ORAL_TABLET | Freq: Every day | ORAL | Status: DC
  Administered 2016-04-11: 50 mg via ORAL
  Filled 2016-04-11: qty 1

## 2016-04-11 MED ORDER — IPRATROPIUM BROMIDE 0.02 % IN SOLN: 0.5000 mg | RESPIRATORY_TRACT | Status: DC | PRN

## 2016-04-11 MED ORDER — ACETAMINOPHEN 325 MG PO TABS: 650.0000 mg | ORAL_TABLET | Freq: Four times a day (QID) | ORAL | Status: DC | PRN

## 2016-04-11 MED ORDER — LORATADINE 10 MG PO TABS
10.0000 mg | ORAL_TABLET | Freq: Every day | ORAL | Status: DC
  Administered 2016-04-11: 10 mg via ORAL
  Filled 2016-04-11: qty 1

## 2016-04-11 MED ORDER — BUDESONIDE 0.5 MG/2ML IN SUSP: 0.5000 mg | Freq: Two times a day (BID) | RESPIRATORY_TRACT | 12 refills | Status: DC

## 2016-04-11 MED ORDER — DOXYCYCLINE HYCLATE 100 MG PO TABS
100.0000 mg | ORAL_TABLET | Freq: Two times a day (BID) | ORAL | Status: DC
  Administered 2016-04-11: 100 mg via ORAL
  Filled 2016-04-11 (×3): qty 1

## 2016-04-11 MED ORDER — GUAIFENESIN ER 600 MG PO TB12: 600.0000 mg | ORAL_TABLET | Freq: Two times a day (BID) | ORAL | 0 refills | Status: DC

## 2016-04-11 MED ORDER — INSULIN ASPART 100 UNIT/ML ~~LOC~~ SOLN
0.0000 [IU] | Freq: Three times a day (TID) | SUBCUTANEOUS | Status: DC
  Administered 2016-04-11: 1 [IU] via SUBCUTANEOUS

## 2016-04-11 MED ORDER — ACETAMINOPHEN 650 MG RE SUPP: 650.0000 mg | Freq: Four times a day (QID) | RECTAL | Status: DC | PRN

## 2016-04-11 MED ORDER — ARFORMOTEROL TARTRATE 15 MCG/2ML IN NEBU
15.0000 ug | INHALATION_SOLUTION | Freq: Two times a day (BID) | RESPIRATORY_TRACT | Status: DC
  Administered 2016-04-11: 15 ug via RESPIRATORY_TRACT
  Filled 2016-04-11: qty 2

## 2016-04-11 MED ORDER — GUAIFENESIN ER 600 MG PO TB12
600.0000 mg | ORAL_TABLET | Freq: Two times a day (BID) | ORAL | Status: DC
  Administered 2016-04-11 (×2): 600 mg via ORAL
  Filled 2016-04-11 (×2): qty 1

## 2016-04-11 MED ORDER — PREDNISONE 10 MG PO TABS: ORAL_TABLET | ORAL | 0 refills | Status: DC

## 2016-04-11 MED ORDER — ALBUTEROL SULFATE (2.5 MG/3ML) 0.083% IN NEBU: 2.5000 mg | INHALATION_SOLUTION | Freq: Three times a day (TID) | RESPIRATORY_TRACT | 12 refills | Status: DC

## 2016-04-11 MED ORDER — FLUTICASONE PROPIONATE 50 MCG/ACT NA SUSP
2.0000 | Freq: Every day | NASAL | Status: DC
  Administered 2016-04-11: 2 via NASAL
  Filled 2016-04-11: qty 16

## 2016-04-11 MED ORDER — GUAIFENESIN-DM 100-10 MG/5ML PO SYRP
5.0000 mL | ORAL_SOLUTION | ORAL | Status: DC | PRN
  Administered 2016-04-11 (×2): 5 mL via ORAL
  Filled 2016-04-11 (×2): qty 5

## 2016-04-11 MED ORDER — ALBUTEROL SULFATE (2.5 MG/3ML) 0.083% IN NEBU: 2.5000 mg | INHALATION_SOLUTION | RESPIRATORY_TRACT | Status: DC | PRN

## 2016-04-11 MED ORDER — ONDANSETRON HCL 4 MG/2ML IJ SOLN: 4.0000 mg | Freq: Four times a day (QID) | INTRAMUSCULAR | Status: DC | PRN

## 2016-04-11 MED ORDER — BUDESONIDE 0.5 MG/2ML IN SUSP
0.5000 mg | Freq: Two times a day (BID) | RESPIRATORY_TRACT | Status: DC
  Administered 2016-04-11: 0.5 mg via RESPIRATORY_TRACT
  Filled 2016-04-11: qty 2

## 2016-04-11 MED ORDER — DOXYCYCLINE HYCLATE 100 MG PO TABS: 100.0000 mg | ORAL_TABLET | Freq: Two times a day (BID) | ORAL | 0 refills | Status: DC

## 2016-04-11 MED ORDER — ONDANSETRON HCL 4 MG PO TABS: 4.0000 mg | ORAL_TABLET | Freq: Four times a day (QID) | ORAL | Status: DC | PRN

## 2016-04-11 MED ORDER — SIMVASTATIN 40 MG PO TABS
40.0000 mg | ORAL_TABLET | Freq: Every day | ORAL | Status: DC
  Administered 2016-04-11: 40 mg via ORAL
  Filled 2016-04-11: qty 1

## 2016-04-11 NOTE — Progress Notes (Signed)
[facility]                                                                              Patient Demographics  Cory Kemp, is a 75 y.o. male, DOB - [birth year], [id number]  Admit date - 04/10/2016   Admitting Physician Norval Morton, MD  Outpatient Primary MD for the patient is Jani Gravel, MD  Outpatient specialists:   LOS - 0  days    Chief Complaint  Patient presents with  . Shortness of Breath       Brief summary    Cory Kemp is a 75 y.o. male with medical history significant of COPD, not on oxygen, tobacco abuse, and diet-controlled diabetes mellitus type 2; who presented with complaints of shortness of breath. Symptoms started over the last 2 weeks. 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 4 days ago while in [place] he noted that he had worsening of his shortness of breath. He tried using his albuterol inhaler multiple times without relief of symptoms. Reports also utilizing nebulized albuterol and ipratropium without relief. Mr. Blok was previously on Anoro for some short duration in  time, but states that he did not continue this medicine secondary to cost. He felt that the shortness of breath was likely due to him being 10,000 feet above sea level. He was seen at the emergency department where he reports that his O2 saturations on room air were noted to be 74%. He was treated with breathing treatments with improvement of symptoms. They felt that he needed oxygen, but did not have the supplies at that time. Ultimately, he was discharged and notes driving immediately to a place in [place] that was below sea level, however his symptoms did not improve and he was seen again at the emergency department. He continues to smoke, but states that he is ready to quit at this time. He has smoked since he was a teenager intermittently quitting. Reports smoking 2 ppd on average but  had recently reduced down to just 1ppd.  He already has appointment scheduled to see Rexene Edison, NP at his pulmonologist office on 9/5 at 2 PM.  ED Course: afebrile with pulse up to 129, respirations up to 31, blood pressure as high as 192/98, and O2 saturations 90-98% on room air. Lab work revealed CBC 11.1, BUN 21, Cr 0.95, and all other lab work was relatively within normal limits. CT angiogram was checked given recent travel history and negative for any signs of PE. Patient was given 1 hour long continuous albuterol treatment, Solu-Medrol 125 mg IV, and second albuterol breathing treatment with improvement in symptoms.    Assessment & Plan   Dyspnea COPD/ exacerbation (HCC)With underlying severe COPD and ongoing nicotine abuse - CTA of the chest showed no evidence of PE, emphysema and chronic bronchitic changes  - Currently significantly improved, continue albuterol, - Placed on oral doxycycline, albuterol, placed on Brovana, Pulmicort - Has not been able to afford Anoro - Obtain 2-D echocardiogram to rule out any underlying CHF or severe coronary hypertension -  Home O2 evaluation - Continue prednisone with taper  Leukocytosis: WBC 11.1 on admission suspect this is likely reactive. -Resolved  Sinus tachycardia:  - Continue to monitor  Essential hypertension  - Continue losartan   Diabetes mellitus type 2: Patient reports that this is diet controlled at this time. - Continue home regimen outpatient  Hyperlipidemia - Continue Zocor   Tobacco abuse:  - Counseled on the need of cessation of tobacco abuse  Code Status: Full CODE STATUS DVT Prophylaxis:  Lovenox  Family Communication: Discussed in detail with the patient, all imaging results, lab results explained to the patient   Disposition Plan: Awaiting 2-D echo  Time Spent in minutes   25 minutes  Procedures:  CTA chest  Consultants:   None  Antimicrobials:      Medications  Scheduled Meds: .  arformoterol  15 mcg Nebulization BID  . budesonide (PULMICORT) nebulizer solution  0.5 mg Nebulization BID  . doxycycline  100 mg Oral Q12H  . enoxaparin (LOVENOX) injection  40 mg Subcutaneous Daily  . fluticasone  2 spray Each Nare Daily  . guaiFENesin  600 mg Oral BID  . insulin aspart  0-9 Units Subcutaneous TID WC  . ipratropium-albuterol  3 mL Nebulization QID  . loratadine  10 mg Oral Daily  . losartan  50 mg Oral Q breakfast  . predniSONE  60 mg Oral Q breakfast  . simvastatin  40 mg Oral QHS   Continuous Infusions:  PRN Meds:.acetaminophen **OR** acetaminophen, albuterol, guaiFENesin-dextromethorphan, ipratropium, ondansetron **OR** ondansetron (ZOFRAN) IV   Antibiotics   Anti-infectives    Start     Dose/Rate Route Frequency Ordered Stop   04/11/16 1000  doxycycline (VIBRA-TABS) tablet 100 mg     100 mg Oral Every 12 hours 04/11/16 0635     04/11/16 0000  doxycycline (VIBRA-TABS) 100 MG tablet     100 mg Oral 2 times daily 04/11/16 0743     04/10/16 2145  levofloxacin (LEVAQUIN) tablet 750 mg     750 mg Oral  Once 04/10/16 2136 04/10/16 2158        Subjective:   Cory Kemp was seen and examined today.Feeling a lot better today, shortness of breath has significantly improved. Patient denies dizziness, chest pain,  abdominal pain, N/V/D/C, new weakness, numbess, tingling. No acute events overnight.    Objective:   Vitals:   04/10/16 2359 04/11/16 0000 04/11/16 0549 04/11/16 0927  BP:   (!) 167/77   Pulse:      Resp:      Temp:   98.4 F (36.9 C)   TempSrc:   Oral   SpO2:   95% 95%  Weight: 92.9 kg (204 lb 12.8 oz) 85.9 kg (189 lb 6.4 oz)    Height: 6\' 1"  (1.854 m)       Intake/Output Summary (Last 24 hours) at 04/11/16 1112 Last data filed at 04/11/16 0300  Gross per 24 hour  Intake               75 ml  Output                0 ml  Net               75 ml     Wt Readings from Last 3 Encounters:  04/11/16 85.9 kg (189 lb 6.4 oz)  01/21/16 86.2 kg  (190 lb)  01/01/16 88.5 kg (195 lb)     Exam  General: Alert and oriented x 3,  NAD  HEENT:  PERRLA, EOMI, Anicteric Sclera, mucous membranes moist.   Neck: Supple, no JVD, no masses  Cardiovascular: S1 S2 auscultated, no rubs, murmurs or gallops. Regular rate and rhythm.  Respiratory: no Significant wheezing, CTAB  Gastrointestinal: Soft, nontender, nondistended, + bowel sounds  Ext: no cyanosis clubbing or edema  Neuro: AAOx3, Cr N's II- XII. Strength 5/5 upper and lower extremities bilaterally  Skin: No rashes  Psych: Normal affect and demeanor, alert and oriented x3    Data Reviewed:  I have personally reviewed following labs and imaging studies  Micro Results No results found for this or any previous visit (from the past 240 hour(s)).  Radiology Reports Dg Chest 2 View  Result Date: 04/10/2016 CLINICAL DATA:  Worsening shortness of breath for 3 weeks. COPD. Chronic smoker. EXAM: CHEST  2 VIEW COMPARISON:  12/29/2015 FINDINGS: Heart size is within normal limits.  Aortic atherosclerosis. Severe pulmonary hyperinflation and central peribronchial thickening are unchanged and consistent with COPD. Mild scarring seen in left lung base. No evidence of acute infiltrate or edema. No evidence of pneumothorax or pleural effusion. IMPRESSION: COPD.  No active lung disease. Aortic atherosclerosis. Electronically Signed   By: Earle Gell M.D.   On: 04/10/2016 18:55   Ct Angio Chest Pe W Or Wo Contrast  Result Date: 04/10/2016 CLINICAL DATA:  Intermittent chest pain shortness of breath for 5 years increasing in past couple days, hypertension, diabetes mellitus, COPD, asthma, smoker, dementia EXAM: CT ANGIOGRAPHY CHEST WITH CONTRAST TECHNIQUE: Multidetector CT imaging of the chest was performed using the standard protocol during bolus administration of intravenous contrast. Multiplanar CT image reconstructions and MIPs were obtained to evaluate the vascular anatomy. CONTRAST:  80 cc Isovue  370 IV COMPARISON:  09/12/2014 FINDINGS: Cardiovascular: Atherosclerotic calcifications aorta, coronary arteries and proximal great vessels. Aorta normal caliber without aneurysm or dissection. No pericardial effusion. Question LEFT ventricular hypertrophy. Pulmonary arteries adequately opacified and patent. No evidence of pulmonary embolism. Mediastinum/Nodes: Few scattered normal sized mediastinal lymph nodes. No thoracic adenopathy. Esophagus unremarkable. Base of cervical region normal appearance Lungs/Pleura: Emphysematous changes consistent with COPD. Peribronchial thickening diffusely. Subsegmental atelectasis at both lower lobes. No pulmonary infiltrate, pleural effusion or pneumothorax. Upper Abdomen: Cyst at upper pole RIGHT kidney 4.8 x 4.8 cm image 125. Small cyst upper pole LEFT kidney. Remaining visualized upper abdomen unremarkable. Musculoskeletal: No acute osseous findings. Review of the MIP images confirms the above findings. IMPRESSION: No evidence of pulmonary embolism. Aortic atherosclerosis and coronary arterial calcification. Question LEFT ventricular hypertrophy. Emphysematous and chronic bronchitic changes compatible with COPD with peribronchial thickening and subsegmental atelectasis in the lower lobes increased since previous exam. Renal cysts. Electronically Signed   By: Lavonia Dana M.D.   On: 04/10/2016 21:32    Lab Data:  CBC:  Recent Labs Lab 04/10/16 1747 04/11/16 0348  WBC 11.1* 8.1  HGB 13.0 12.4*  HCT 42.0 41.0  MCV 94.4 93.6  PLT 190 [id number]   Basic Metabolic Panel:  Recent Labs Lab 04/10/16 1747 04/11/16 0348  NA 141 139  K 3.5 4.3  CL 106 102  CO2 28 30  GLUCOSE 196* 190*  BUN 21* 18  CREATININE 0.95 0.86  CALCIUM 8.9 8.9   GFR: Estimated Creatinine Clearance: 83.9 mL/min (by C-G formula based on SCr of 0.86 mg/dL). Liver Function Tests:  Recent Labs Lab 04/10/16 1838  AST 20  ALT 19  ALKPHOS 65  BILITOT 0.5  PROT 6.1*  ALBUMIN 3.6   No  results for  input(s): LIPASE, AMYLASE in the last 168 hours. No results for input(s): AMMONIA in the last 168 hours. Coagulation Profile: No results for input(s): INR, PROTIME in the last 168 hours. Cardiac Enzymes:  Recent Labs Lab 04/10/16 1707  TROPONINI <0.03   BNP (last 3 results) No results for input(s): PROBNP in the last 8760 hours. HbA1C: No results for input(s): HGBA1C in the last 72 hours. CBG:  Recent Labs Lab 04/11/16 0848  GLUCAP 115*   Lipid Profile: No results for input(s): CHOL, HDL, LDLCALC, TRIG, CHOLHDL, LDLDIRECT in the last 72 hours. Thyroid Function Tests: No results for input(s): TSH, T4TOTAL, FREET4, T3FREE, THYROIDAB in the last 72 hours. Anemia Panel: No results for input(s): VITAMINB12, FOLATE, FERRITIN, TIBC, IRON, RETICCTPCT in the last 72 hours. Urine analysis:    Component Value Date/Time   COLORURINE YELLOW 07/22/2013 0725   APPEARANCEUR CLEAR 07/22/2013 0725   LABSPEC 1.011 07/22/2013 0725   PHURINE 6.5 07/22/2013 0725   GLUCOSEU NEGATIVE 07/22/2013 0725   HGBUR TRACE (A) 07/22/2013 0725   BILIRUBINUR NEGATIVE 07/22/2013 0725   KETONESUR NEGATIVE 07/22/2013 0725   PROTEINUR NEGATIVE 07/22/2013 0725   UROBILINOGEN 0.2 07/22/2013 0725   NITRITE NEGATIVE 07/22/2013 0725   LEUKOCYTESUR NEGATIVE 07/22/2013 0725     Ivanna Kocak M.D. [facility] 04/11/2016, 11:12 AM  Pager: [phone] Between 7am to 7pm - call Pager - [phone]  After 7pm go to [email or web address] - password [email or web address]  Call night coverage person covering after 7pm

## 2016-04-11 NOTE — Discharge Summary (Signed)
Physician Discharge Summary   Patient ID: Cory Kemp MRN: [id number] DOB/AGE: May 06, 1941 75 y.o.  Admit date: 04/10/2016 Discharge date: 04/11/2016  Primary Care Physician:  Cory Gravel, MD  Discharge Diagnoses:    . COPD exacerbation ([place]) . Tobacco abuse . Sinus tachycardia ([place]) . Essential hypertension   Consults: none   Recommendations for Outpatient Follow-up:  1. Patient was started on brovana, Pulmicort. He was not able to afford Anoro 2. Please repeat CBC/BMET at next visit 3. He did not qualify for home O2  DIET: Heart healthy diet    Allergies:   Allergies  Allergen Reactions  . Adhesive [Tape] Rash     DISCHARGE MEDICATIONS: Current Discharge Medication List    START taking these medications   Details  arformoterol (BROVANA) 15 MCG/2ML NEBU Take 2 mLs (15 mcg total) by nebulization 2 (two) times daily. ICD 10 code J44.9. Qty: 120 mL, Refills: 3    budesonide (PULMICORT) 0.5 MG/2ML nebulizer solution Take 2 mLs (0.5 mg total) by nebulization 2 (two) times daily. ICD 10 code J44.9. Qty: 60 mL, Refills: 12    doxycycline (VIBRA-TABS) 100 MG tablet Take 1 tablet (100 mg total) by mouth 2 (two) times daily. X 10 days Qty: 20 tablet, Refills: 0    guaiFENesin (MUCINEX) 600 MG 12 hr tablet Take 1 tablet (600 mg total) by mouth 2 (two) times daily. Qty: 30 tablet, Refills: 0    predniSONE (DELTASONE) 10 MG tablet Prednisone dosing: Take  Prednisone 40mg  (4 tabs) x 3 days, then taper to 30mg  (3 tabs) x 3 days, then 20mg  (2 tabs) x 3days, then 10mg  (1 tab) x 3days, then OFF.  Dispense:  30 tabs, refills: None Qty: 30 tablet, Refills: 0      CONTINUE these medications which have CHANGED   Details  albuterol (PROVENTIL) (2.5 MG/3ML) 0.083% nebulizer solution Take 3 mLs (2.5 mg total) by nebulization 3 (three) times daily. Wheezing and shortness of breath Qty: 75 mL, Refills: 12      CONTINUE these medications which have NOT CHANGED   Details  albuterol  (PROAIR HFA) 108 (90 Base) MCG/ACT inhaler Inhale 2 puffs into the lungs every 4 (four) hours as needed for wheezing or shortness of breath. Qty: 1 Inhaler, Refills: 5    cholecalciferol (VITAMIN D) 1000 UNITS tablet Take 1,000 Units by mouth daily.     loratadine (CLARITIN) 10 MG tablet Take 1 tablet (10 mg total) by mouth daily. Qty: 30 tablet, Refills: 1    losartan (COZAAR) 50 MG tablet Take 50 mg by mouth daily with breakfast.     mometasone (NASONEX) 50 MCG/ACT nasal spray Place 4 sprays into the nose daily. Qty: 17 g, Refills: 1    Naproxen Sodium (ALEVE) 220 MG CAPS Take 440 mg by mouth daily as needed (for pain). For pain    Omega-3 Fatty Acids (FISH OIL PO) Take 1 capsule by mouth daily.    sildenafil (REVATIO) 20 MG tablet Take 20-100 mg by mouth daily as needed (ED).    simvastatin (ZOCOR) 40 MG tablet Take 40 mg by mouth at bedtime.     vitamin B-12 (CYANOCOBALAMIN) 100 MCG tablet Take 100 mcg by mouth daily.      STOP taking these medications     umeclidinium-vilanterol (ANORO ELLIPTA) 62.5-25 MCG/INH AEPB          Brief H and P: For complete details please refer to admission H and P, but in brief Cory Kemp a 75 y.o.malewith  medical history significant of COPD, not on oxygen, tobacco abuse, and diet-controlled diabetes mellitus type 2;who presented with complaints of shortness of breath. Symptoms started over the last 2 weeks. 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 4 days ago while in [place] he noted that he had worsening of his shortness of breath. He tried using his albuterol inhaler multiple times without relief of symptoms. Reports also utilizing nebulized albuterol and ipratropium without relief. Cory Kemp waspreviously on Anoro for some short duration in time, but states that he did not continue this medicine secondary to cost. He felt that the shortness of  breath was likely due to him being [place],000 feet above sea level. He was seen at the emergency department where he reports that his O2 saturations on room air were noted to be 74%. He was treated with breathing treatments with improvement of symptoms. They felt that he needed oxygen,but did not have the supplies at that time. Ultimately, he was discharged and notes driving immediately to a place in [place] that was below sea level,however hissymptoms did not improve and he was seen again at the emergency department. He continues to smoke,but states that he is ready to quit at this time. He has smoked since he was a teenager intermittently quitting. Reports smoking 2 ppd on average but had recently reduced down to just 1ppd. He alreadyhas appointment scheduled to see Cory Kemp, NPat his pulmonologist office on 9/5 at 2 PM. ED Course:afebrile with pulse up to 129, respirations up to 31, blood pressure as high as 192/98, and O2 saturations 90-98% on room air. Lab work revealed CBC 11.1, BUN 21, Cr 0.95, and all other lab work was relatively within normal limits. CT angiogram was checked given recent travel history and negative for any signs of PE. Patient was given 1 hour long continuous albuterol treatment, Solu-Medrol 125 mg IV, and second albuterol breathing treatment with improvement in symptoms.    Hospital Course:  Dyspnea COPD/exacerbation (HCC)With underlying severe COPD and ongoing nicotine abuse - CTA of the chest showed no evidence of PE, emphysema and chronic bronchitic changes. Recent PFTs in 01/2016 had shown severe COPD, FEV1/FVC ratio 45%, elevated TLC   - Currently significantly improved, continue albuterol, - Placed on oral doxycycline, albuterol, placed on Brovana, Pulmicort - Has not been able to afford Anoro - 2-D echo showed preserved EF, 60-65%, mild aortic stenosis - Patient underwent Home O2 evaluation and did not qualify for O2 - Continue prednisone with taper. He  feels back to his baseline and was ambulated in the hallway.  He will continue albuterol nebs, Brovana, Pulmicort, doxycycline and prednisone with taper for discharge. Patient has a follow-up appointment with pulmonology outpatient tomorrow.  Leukocytosis: WBC 11.1 on admission suspect this is likely reactive. -Resolved  Sinus tachycardia:  - Continue to monitor  Essential hypertension  - Continue losartan   Diabetes mellitus type 2: Patient reports that this is diet controlled at this time. - Continue home regimen outpatient  Hyperlipidemia - Continue Zocor   Tobacco abuse:  - Counseled on the need of cessation of tobacco abuse   Day of Discharge BP (!) 167/77 (BP Location: Right Arm)   Pulse (!) 108   Temp 98.4 F (36.9 C) (Oral)   Resp 18   Ht 6\' 1"  (1.854 m)   Wt 85.9 kg (189 lb 6.4 oz)   SpO2 95%   BMI 24.99 kg/m  Physical Exam: General: Alert and awake oriented x3 not in any acute distress. HEENT: anicteric sclera, pupils reactive to light and accommodation CVS: S1-S2 clear no murmur rubs or gallops Chest: clear to auscultation bilaterally, no wheezing rales or rhonchi Abdomen: soft nontender, nondistended, normal bowel sounds Extremities: no cyanosis, clubbing or edema noted bilaterally Neuro: Cranial nerves II-XII intact, no focal neurological deficits   The results of significant diagnostics from this hospitalization (including imaging, microbiology, ancillary and laboratory) are listed below for reference.    LAB RESULTS: Basic Metabolic Panel:  Recent Labs Lab 04/10/16 1747 04/11/16 0348  NA 141 139  K 3.5 4.3  CL 106 102  CO2 28 30  GLUCOSE 196* 190*  BUN 21* 18  CREATININE 0.95 0.86  CALCIUM 8.9 8.9   Liver Function Tests:  Recent Labs Lab 04/10/16 1838  AST 20  ALT 19  ALKPHOS 65  BILITOT 0.5  PROT 6.1*  ALBUMIN 3.6   No results for input(s): LIPASE, AMYLASE in the last 168 hours. No results for input(s): AMMONIA in the  last 168 hours. CBC:  Recent Labs Lab 04/10/16 1747 04/11/16 0348  WBC 11.1* 8.1  HGB 13.0 12.4*  HCT 42.0 41.0  MCV 94.4 93.6  PLT 190 174   Cardiac Enzymes:  Recent Labs Lab 04/10/16 1707  TROPONINI <0.03   BNP: Invalid input(s): POCBNP CBG:  Recent Labs Lab 04/11/16 0848 04/11/16 1241  GLUCAP 115* 121*    Significant Diagnostic Studies:  Dg Chest 2 View  Result Date: 04/10/2016 CLINICAL DATA:  Worsening shortness of breath for 3 weeks. COPD. Chronic smoker. EXAM: CHEST  2 VIEW COMPARISON:  12/29/2015 FINDINGS: Heart size is within normal limits.  Aortic atherosclerosis. Severe pulmonary hyperinflation and central peribronchial thickening are unchanged and consistent with COPD. Mild scarring seen in left lung base. No evidence of acute infiltrate or edema. No evidence of pneumothorax or pleural effusion. IMPRESSION: COPD.  No active lung disease. Aortic atherosclerosis. Electronically Signed   By: Earle Gell M.D.   On: 04/10/2016 18:55   Ct Angio Chest Pe W Or Wo Contrast  Result Date: 04/10/2016 CLINICAL DATA:  Intermittent chest pain shortness of breath for 5 years increasing in past couple days, hypertension, diabetes mellitus, COPD, asthma, smoker, dementia EXAM: CT ANGIOGRAPHY CHEST WITH CONTRAST TECHNIQUE: Multidetector CT imaging of the chest was performed using the standard protocol during bolus administration of intravenous contrast. Multiplanar CT image reconstructions and MIPs were obtained to evaluate the vascular anatomy. CONTRAST:  80 cc Isovue 370 IV COMPARISON:  09/12/2014 FINDINGS: Cardiovascular: Atherosclerotic calcifications aorta, coronary arteries and proximal great vessels. Aorta normal caliber without aneurysm or dissection. No pericardial effusion. Question LEFT ventricular hypertrophy. Pulmonary arteries adequately opacified and patent. No evidence of pulmonary embolism. Mediastinum/Nodes: Few scattered normal sized mediastinal lymph nodes. No thoracic  adenopathy. Esophagus unremarkable. Base of cervical region normal appearance Lungs/Pleura: Emphysematous changes consistent with COPD. Peribronchial thickening diffusely. Subsegmental atelectasis at both lower lobes. No pulmonary infiltrate, pleural effusion or pneumothorax. Upper Abdomen: Cyst at upper pole RIGHT kidney 4.8 x 4.8 cm image 125. Small cyst upper pole LEFT kidney. Remaining visualized upper abdomen unremarkable. Musculoskeletal: No acute osseous findings. Review of the MIP images confirms the above findings. IMPRESSION: No evidence of pulmonary embolism. Aortic atherosclerosis and coronary arterial calcification. Question LEFT ventricular hypertrophy. Emphysematous and chronic bronchitic changes compatible with COPD with peribronchial thickening and subsegmental atelectasis in the lower lobes increased since previous exam. Renal cysts. Electronically Signed   By: Elta Guadeloupe  Thornton Papas M.D.   On: 04/10/2016 21:32    2D ECHO: Study Conclusions  - Left ventricle: The cavity size was normal. Wall thickness was   increased in a pattern of mild LVH. Systolic function was normal.   The estimated ejection fraction was in the range of 60% to 65%.   Diastolic function is abnormal, indeterminate grade. Mixed   findings for elevated LA pressure. - Aortic valve: Mildly calcified annulus. Normal thickness   leaflets. There was mild stenosis. Mean gradient (S): 14 mm Hg.   Valve area (VTI): 1.62 cm^2. - Mitral valve: There was mild regurgitation. - Left atrium: The atrium was moderately dilated. - Atrial septum: No defect or patent foramen ovale was identified. - Technically difficult study.   Disposition and Follow-up:    DISPOSITION: home    [place], MD .   Specialty:  Internal Medicine Contact information: [address] [place] [address] [phone]        Cory Edison, NP Follow up on 04/12/2016.   Specialty:   Pulmonary Disease Why:  at 2:00 PM Contact information: [address] [address] [phone]            Time spent on Discharge: 66mins   Signed:   Oneal Biglow M.D. [facility] 04/11/2016, 12:47 PM Pager: [phone]

## 2016-04-11 NOTE — Progress Notes (Signed)
  Patient Saturations on Room Air at Rest = 96%  Patient Saturations on [employer] while Ambulating = 96%

## 2016-04-11 NOTE — Progress Notes (Signed)
  Echocardiogram 2D Echocardiogram has been performed.  Cory Kemp 04/11/2016, 10:47 AM

## 2016-04-11 NOTE — Care Management Obs Status (Signed)
[facility] NOTIFICATION   Patient Details  Name: Cory Kemp MRN: [id number] Date of Birth: Mar 18, 1941   Medicare Observation Status Notification Given:  Yes    Sharin Mons, RN 04/11/2016, 11:59 AM

## 2016-04-12 ENCOUNTER — Encounter: Payer: Self-pay | Admitting: Adult Health

## 2016-04-12 ENCOUNTER — Ambulatory Visit (INDEPENDENT_AMBULATORY_CARE_PROVIDER_SITE_OTHER): Payer: Medicare HMO | Admitting: Adult Health

## 2016-04-12 DIAGNOSIS — J441 Chronic obstructive pulmonary disease with (acute) exacerbation: Secondary | ICD-10-CM | POA: Diagnosis not present

## 2016-04-12 MED ORDER — ARFORMOTEROL TARTRATE 15 MCG/2ML IN NEBU: INHALATION_SOLUTION | RESPIRATORY_TRACT | 5 refills | Status: DC

## 2016-04-12 MED ORDER — BUDESONIDE 0.5 MG/2ML IN SUSP: 0.5000 mg | Freq: Two times a day (BID) | RESPIRATORY_TRACT | 5 refills | Status: DC

## 2016-04-12 NOTE — Patient Instructions (Addendum)
Finish Prednisone and Doxycycline as directed.  Must quit smoking .  Begin Brovana and Budesonide neb Twice daily  , rinse after use.  We will send this prescription to DME to see if it is cheaper there.  If not able to afford will need to choose something else .  Follow up with Dr. Vaughan Browner in 2-3 months and As needed   Please contact office for sooner follow up if symptoms do not improve or worsen or seek emergency care

## 2016-04-12 NOTE — Assessment & Plan Note (Signed)
Recurrent exacerbations. An active smoker. Patient encouraged on smoking cessation. We'll try to switch over to nebulizers to see if more affordable. If patient is unable to afford provide a and budesonide may need to consider DuoNeb 4 times a day as this is less expensive regimen.  Plan  Patient Instructions  Finish Prednisone and Doxycycline as directed.  Must quit smoking .  Begin Brovana and Budesonide neb Twice daily  , rinse after use.  We will send this prescription to DME to see if it is cheaper there.  If not able to afford will need to choose something else .  Follow up with Dr. Vaughan Browner in 2-3 months and As needed   Please contact office for sooner follow up if symptoms do not improve or worsen or seek emergency care

## 2016-04-12 NOTE — Addendum Note (Signed)
Addended by: Osa Craver on: 04/12/2016 03:00 PM   Modules accepted: Orders

## 2016-04-12 NOTE — Progress Notes (Signed)
Subjective:    Patient ID: Cory Kemp, male    DOB: Oct 05, 1940, 75 y.o.   MRN: [id number]  HPI 75 yo male smoker with COPD   PFT 01/21/16 FEV1 41%, ratio 43, FVC 71%, DLCO 48%  04/12/2016 [facility] follow up  Pt returns for post hospital follow-up. Patient was recently admitted for COPD exacerbation. Discharged yesterday. He was treated with IV antibiotics, steroids and nebulized bronchodilators. He was discharged on a prednisone taper and doxycycline. Patient has been unable to afford his inhalers. He was changed over to provide a and Pulmicort. Unfortunately, this was sent to the local pharmacy. We discussed changing this to a local home care company to see if they will be better covered. Patient did not desaturate. Therefore, did not qualify for oxygen at discharge. Prior to admission. Patient had been traveling out [place] in [place]. He did have a hospitalization. Therefore similar symptoms. Has been felt that the high elevations had exacerbated his dyspnea. A CT chest was done and was negative for a pulmonary embolism. It did show emphysematous and chronic bronchitic changes. Patient does continue to smoke. We discussed smoking cessation.  Start patient says he is starting to feel improved. He has decreased cough and shortness of breath. He has not picked up his prescriptions as of yet. However, says he's going to the pharmacy this afternoon. Denies chest pain, orthopnea, edema or fever.      Past Medical History:  Diagnosis Date  . Alcohol abuse   . Anemia   . Arthritis   . Asthma   . B12 deficiency 11/03/2013  . BPH (benign prostatic hypertrophy)   . COPD (chronic obstructive pulmonary disease) ([place])   . Dementia    ?  . Diabetes mellitus ([place]) 01/10/2012  . Femoral hernia, right 03/27/2012  . Glucose intolerance (impaired glucose tolerance)   . Hyperlipidemia   . Hypertension   . Incisional hernias - swiss-cheese-type periumbilical [id number]  . Inguinal hernia - right  01/10/2012  . Leukopenia 01/29/2013  . Monocytosis 01/29/2013  . Normochromic anemia 01/29/2013  . Osteopenia    . Current Outpatient Prescriptions on File Prior to Visit  Medication Sig Dispense Refill  . albuterol (PROAIR HFA) 108 (90 Base) MCG/ACT inhaler Inhale 2 puffs into the lungs every 4 (four) hours as needed for wheezing or shortness of breath. 1 Inhaler 5  . albuterol (PROVENTIL) (2.5 MG/3ML) 0.083% nebulizer solution Take 3 mLs (2.5 mg total) by nebulization 3 (three) times daily. Wheezing and shortness of breath 75 mL 12  . arformoterol (BROVANA) 15 MCG/2ML NEBU Take 2 mLs (15 mcg total) by nebulization 2 (two) times daily. ICD 10 code J44.9. 120 mL 3  . budesonide (PULMICORT) 0.5 MG/2ML nebulizer solution Take 2 mLs (0.5 mg total) by nebulization 2 (two) times daily. ICD 10 code J44.9. 60 mL 12  . cholecalciferol (VITAMIN D) 1000 UNITS tablet Take 1,000 Units by mouth daily.     [name] Kitchen guaiFENesin (MUCINEX) 600 MG 12 hr tablet Take 1 tablet (600 mg total) by mouth 2 (two) times daily. 30 tablet 0  . losartan (COZAAR) 50 MG tablet Take 50 mg by mouth daily with breakfast.     . mometasone (NASONEX) 50 MCG/ACT nasal spray Place 4 sprays into the nose daily. 17 g 1  . Naproxen Sodium (ALEVE) 220 MG CAPS Take 440 mg by mouth daily as needed (for pain). For pain    . sildenafil (REVATIO) 20 MG tablet Take 20-100 mg by mouth daily as  needed (ED).    . simvastatin (ZOCOR) 40 MG tablet Take 40 mg by mouth at bedtime.     . vitamin B-12 (CYANOCOBALAMIN) 100 MCG tablet Take 100 mcg by mouth daily.    [name] Kitchen doxycycline (VIBRA-TABS) 100 MG tablet Take 1 tablet (100 mg total) by mouth 2 (two) times daily. X 10 days (Patient not taking: Reported on 04/12/2016) 20 tablet 0  . loratadine (CLARITIN) 10 MG tablet Take 1 tablet (10 mg total) by mouth daily. (Patient not taking: Reported on 04/12/2016) 30 tablet 1  . Omega-3 Fatty Acids (FISH OIL PO) Take 1 capsule by mouth daily.    . predniSONE (DELTASONE) 10 MG  tablet Prednisone dosing: Take  Prednisone 40mg  (4 tabs) x 3 days, then taper to 30mg  (3 tabs) x 3 days, then 20mg  (2 tabs) x 3days, then 10mg  (1 tab) x 3days, then OFF.  Dispense:  30 tabs, refills: None (Patient not taking: Reported on 04/12/2016) 30 tablet 0   No current facility-administered medications on file prior to visit.       Review of Systems Constitutional:   No  weight loss, night sweats,  Fevers, chills,  +fatigue, or  lassitude.  HEENT:   No headaches,  Difficulty swallowing,  Tooth/dental problems, or  Sore throat,                No sneezing, itching, ear ache, nasal congestion, post nasal drip,   CV:  No chest pain,  Orthopnea, PND, swelling in lower extremities, anasarca, dizziness, palpitations, syncope.   GI  No heartburn, indigestion, abdominal pain, nausea, vomiting, diarrhea, change in bowel habits, loss of appetite, bloody stools.   Resp: .  No chest wall deformity  Skin: no rash or lesions.  GU: no dysuria, change in color of urine, no urgency or frequency.  No flank pain, no hematuria   MS:  No joint pain or swelling.  No decreased range of motion.  No back pain.  Psych:  No change in mood or affect. No depression or anxiety.  No memory loss.         Objective:   Physical Exam Vitals:   04/12/16 1408  BP: (!) 154/84  Pulse: (!) 104  Temp: 98.3 F (36.8 C)  TempSrc: Oral  SpO2: 93%  Weight: 196 lb (88.9 kg)  Height: 6\' 1"  (1.854 m)     .GEN: A/Ox3; pleasant , NAD, elderly    HEENT:  [place]/AT,  EACs-clear, TMs-wnl, NOSE-clear, THROAT-clear, no lesions, no postnasal drip or exudate noted.   NECK:  Supple w/ fair ROM; no JVD; normal carotid impulses w/o bruits; no thyromegaly or nodules palpated; no lymphadenopathy.    RESP  Decreased BS in bases , no accessory muscle use, no dullness to percussion  CARD:  RRR, no m/r/g  , no peripheral edema, pulses intact, no cyanosis or clubbing.  GI:   Soft & nt; nml bowel sounds; no organomegaly or  masses detected.   Musco: Warm bil, no deformities or joint swelling noted.   Neuro: alert, no focal deficits noted.    Skin: Warm, no lesions or rashes  Tammy Parrett NP-C  [facility]  04/12/2016

## 2016-06-13 ENCOUNTER — Ambulatory Visit: Payer: Medicare HMO | Admitting: Pulmonary Disease

## 2016-06-14 ENCOUNTER — Ambulatory Visit (INDEPENDENT_AMBULATORY_CARE_PROVIDER_SITE_OTHER): Payer: Medicare HMO | Admitting: Pulmonary Disease

## 2016-06-14 ENCOUNTER — Encounter: Payer: Self-pay | Admitting: Pulmonary Disease

## 2016-06-14 VITALS — BP 102/60 | HR 108 | Ht 72.0 in | Wt 202.0 lb

## 2016-06-14 DIAGNOSIS — R0600 Dyspnea, unspecified: Secondary | ICD-10-CM

## 2016-06-14 DIAGNOSIS — J441 Chronic obstructive pulmonary disease with (acute) exacerbation: Secondary | ICD-10-CM | POA: Diagnosis not present

## 2016-06-14 NOTE — Progress Notes (Signed)
Cory Kemp    [id number]    March 10, 1941  Primary Care Physician:Aryn [name], MD  Referring Physician: Jani Gravel, MD [address] [place], [place] [address]  Chief complaint:   Follow up for Severe COPD (FEV1 41%) Asthmatic bronchitis Active smoker  HPI: [name] is a 75 year old chief complaint of dyspnea on exertion for the past 2 years. This is associated with cough, white mucus production, chest congestion, wheezing. He is a current 1 pack per day smoker. He is on albuterol which helps with her symptoms. He had been tried on various inhalers including Advair, Breo and Spiriva which did not help.  Interim history: Over the summer he was hospitalized in [place] for a COPD exacerbation. He was then seen in September in the ED for a COPD exacerbation. His inhalers have been changed Brovana and Pulmicort. He quit smoking a couple of weeks ago and reports a mild improvement in symptoms. He still has attacks of dyspnea on occasion that is likely exacerbated by anxiety.  Outpatient Encounter Prescriptions as of 06/14/2016  Medication Sig  . albuterol (PROAIR HFA) 108 (90 Base) MCG/ACT inhaler Inhale 2 puffs into the lungs every 4 (four) hours as needed for wheezing or shortness of breath.  [name] Kitchen albuterol (PROVENTIL) (2.5 MG/3ML) 0.083% nebulizer solution Take 3 mLs (2.5 mg total) by nebulization 3 (three) times daily. Wheezing and shortness of breath  . arformoterol (BROVANA) 15 MCG/2ML NEBU Take 25mls by nebulization two times a day.  ICD 10 code J44.9.  [name] Kitchen budesonide (PULMICORT) 0.5 MG/2ML nebulizer solution Take 2 mLs (0.5 mg total) by nebulization 2 (two) times daily. ICD 10 code J44.9.  [name] Kitchen cholecalciferol (VITAMIN D) 1000 UNITS tablet Take 1,000 Units by mouth daily.   [name] Kitchen guaiFENesin (MUCINEX) 600 MG 12 hr tablet Take 1 tablet (600 mg total) by mouth 2 (two) times daily.  [name] Kitchen loratadine (CLARITIN) 10 MG tablet Take 1 tablet (10 mg total) by mouth daily.  [name] Kitchen losartan  (COZAAR) 50 MG tablet Take 50 mg by mouth daily with breakfast.   . mometasone (NASONEX) 50 MCG/ACT nasal spray Place 4 sprays into the nose daily.  . Naproxen Sodium (ALEVE) 220 MG CAPS Take 440 mg by mouth daily as needed (for pain). For pain  . Omega-3 Fatty Acids (FISH OIL PO) Take 1 capsule by mouth daily.  . sildenafil (REVATIO) 20 MG tablet Take 20-100 mg by mouth daily as needed (ED).  . simvastatin (ZOCOR) 40 MG tablet Take 40 mg by mouth at bedtime.   . vitamin B-12 (CYANOCOBALAMIN) 100 MCG tablet Take 100 mcg by mouth daily.  . [DISCONTINUED] doxycycline (VIBRA-TABS) 100 MG tablet Take 1 tablet (100 mg total) by mouth 2 (two) times daily. X 10 days (Patient not taking: Reported on 06/14/2016)  . [DISCONTINUED] predniSONE (DELTASONE) 10 MG tablet Prednisone dosing: Take  Prednisone 40mg  (4 tabs) x 3 days, then taper to 30mg  (3 tabs) x 3 days, then 20mg  (2 tabs) x 3days, then 10mg  (1 tab) x 3days, then OFF.  Dispense:  30 tabs, refills: None (Patient not taking: Reported on 06/14/2016)   No facility-administered encounter medications on file as of 06/14/2016.     Allergies as of 06/14/2016 - Review Complete 06/14/2016  Allergen Reaction Noted  . Adhesive [tape] Rash 02/26/2012    Past Medical History:  Diagnosis Date  . Alcohol abuse   . Anemia   . Arthritis   . Asthma   . B12 deficiency  11/03/2013  . BPH (benign prostatic hypertrophy)   . COPD (chronic obstructive pulmonary disease) ([place])   . Dementia    ?  . Diabetes mellitus ([place]) 01/10/2012  . Femoral hernia, right 03/27/2012  . Glucose intolerance (impaired glucose tolerance)   . Hyperlipidemia   . Hypertension   . Incisional hernias - swiss-cheese-type periumbilical [id number]  . Inguinal hernia - right 01/10/2012  . Leukopenia 01/29/2013  . Monocytosis 01/29/2013  . Normochromic anemia 01/29/2013  . Osteopenia     Past Surgical History:  Procedure Laterality Date  . HEMORRHOID SURGERY    . HERNIA REPAIR    . INGUINAL  HERNIA REPAIR  02/24/2012   Procedure: LAPAROSCOPIC INGUINAL HERNIA;  Surgeon: Adin Hector, MD;  Location: [facility];  Service: General;  Laterality: N/A;  . TUMOR EXCISION     intestinal after SBO ( Benign), appendectomy  . VENTRAL HERNIA REPAIR  02/24/2012   Procedure: LAPAROSCOPIC VENTRAL HERNIA;  Surgeon: Adin Hector, MD;  Location: [facility];  Service: General;  Laterality: N/A;  lysis of adhesions, excision of abdominal wall lipomae    Family History  Problem Relation Age of Onset  . Lung cancer Father   . Lung cancer Mother   . Diabetes Brother     Social History   Social History  . Marital status: Single    Spouse name: N/A  . Number of children: 2  . Years of education: N/A   Occupational History  . Retired     Armed forces operational officer   Social History Main Topics  . Smoking status: Current Every Day Smoker    Packs/day: 1.00    Types: Cigarettes    Start date: 08/09/1955  . Smokeless tobacco: Never Used     Comment: quit smoking 06/08/2016  . Alcohol use No  . Drug use: No  . Sexual activity: Not on file   Other Topics Concern  . Not on file   Social History Narrative  . No narrative on file     Review of systems: Review of Systems  Constitutional: Negative for fever and chills.  HENT: Negative.   Eyes: Negative for blurred vision.  Respiratory: as per HPI  Cardiovascular: Negative for chest pain and palpitations.  Gastrointestinal: Negative for vomiting, diarrhea, blood per rectum. Genitourinary: Negative for dysuria, urgency, frequency and hematuria.  Musculoskeletal: Negative for myalgias, back pain and joint pain.  Skin: Negative for itching and rash.  Neurological: Negative for dizziness, tremors, focal weakness, seizures and loss of consciousness.  Endo/Heme/Allergies: Negative for environmental allergies.  Psychiatric/Behavioral: Negative for depression, suicidal ideas and hallucinations.  All other systems reviewed and are negative.   Physical  Exam: There were no vitals taken for this visit. Gen:      No acute distress HEENT:  EOMI, sclera anicteric Neck:     No masses; no thyromegaly Lungs:    Clear to auscultation bilaterally; normal respiratory effort CV:         Regular rate and rhythm; no murmurs Abd:      + bowel sounds; soft, non-tender; no palpable masses, no distension Ext:    No edema; adequate peripheral perfusion Skin:      Warm and dry; no rash Neuro: alert and oriented x 3 Psych: normal mood and affect  Data Reviewed: CT chest 09/12/14 Mild emphysematous changes noted bilaterally. Minimal posterior basilar scarring or subsegmental atelectasis is noted. No pulmonary mass or nodule is noted  PFTs 01/21/16 FVC 2.44 (71%) FEV1 1.47 [41%) F/F 43 TLC  122% RV/TLC 166% DLCO 48% Severe obstruction with reduction in diffusion capacity. Hyperinflation with air trapping.  Assessment:  Asthma COPD overlap syndrome  His chief complaint is dyspnea on exertion and he had not tolerated many of the inhalers in the past. Switched to Pulmicort and brovana. Symptoms are better after he quit smoking. PFTs were reviewed. Although they do not meet the formal criteria he does have a big improvement in FEV1 post bronchodilator suggestive of an asthmatic component.  Plan/Recommendations: - Encouraged to keep of cigarettes - Continue brovana, pulmicort and albuterol PRN.  Return in 3 months.    [name] MD  [facility] Pager [phone] 06/14/2016, 12:07 PM  CC: Jani Gravel, MD

## 2016-06-14 NOTE — Patient Instructions (Signed)
Continue using the nebs as prescribed Return to clinic in 3 months

## 2016-08-19 DIAGNOSIS — J449 Chronic obstructive pulmonary disease, unspecified: Secondary | ICD-10-CM | POA: Diagnosis not present

## 2016-08-30 DIAGNOSIS — J449 Chronic obstructive pulmonary disease, unspecified: Secondary | ICD-10-CM | POA: Diagnosis not present

## 2016-09-05 DIAGNOSIS — J441 Chronic obstructive pulmonary disease with (acute) exacerbation: Secondary | ICD-10-CM | POA: Diagnosis not present

## 2016-09-05 DIAGNOSIS — I10 Essential (primary) hypertension: Secondary | ICD-10-CM | POA: Diagnosis not present

## 2016-09-05 DIAGNOSIS — R05 Cough: Secondary | ICD-10-CM | POA: Diagnosis not present

## 2016-09-12 DIAGNOSIS — I10 Essential (primary) hypertension: Secondary | ICD-10-CM | POA: Diagnosis not present

## 2016-09-12 DIAGNOSIS — Z87898 Personal history of other specified conditions: Secondary | ICD-10-CM | POA: Diagnosis not present

## 2016-09-12 DIAGNOSIS — Z125 Encounter for screening for malignant neoplasm of prostate: Secondary | ICD-10-CM | POA: Diagnosis not present

## 2016-09-19 DIAGNOSIS — E7800 Pure hypercholesterolemia, unspecified: Secondary | ICD-10-CM | POA: Diagnosis not present

## 2016-09-19 DIAGNOSIS — Z720 Tobacco use: Secondary | ICD-10-CM | POA: Diagnosis not present

## 2016-09-19 DIAGNOSIS — Z0000 Encounter for general adult medical examination without abnormal findings: Secondary | ICD-10-CM | POA: Diagnosis not present

## 2016-09-19 DIAGNOSIS — J449 Chronic obstructive pulmonary disease, unspecified: Secondary | ICD-10-CM | POA: Diagnosis not present

## 2016-10-03 ENCOUNTER — Encounter: Payer: Self-pay | Admitting: Pulmonary Disease

## 2016-10-03 ENCOUNTER — Ambulatory Visit (INDEPENDENT_AMBULATORY_CARE_PROVIDER_SITE_OTHER): Payer: Medicare HMO | Admitting: Pulmonary Disease

## 2016-10-03 VITALS — BP 124/80 | HR 55 | Ht 72.0 in | Wt 190.0 lb

## 2016-10-03 DIAGNOSIS — G4730 Sleep apnea, unspecified: Secondary | ICD-10-CM | POA: Diagnosis not present

## 2016-10-03 NOTE — Patient Instructions (Addendum)
We will schedule you for a home sleep test. Continue the brovana and pulmicort nebs and albuterol PRN Continue to work on your smoking cessation  Return in 3 months

## 2016-10-03 NOTE — Progress Notes (Addendum)
ERUBEY SUPPES    [id number]    03/19/1941  Primary Care Physician:Tammy [name], MD  Referring Physician: Jani Gravel, MD [address] [place], [place] [address]  Chief complaint:   Follow up for COPD GOLD C (FEV1 41%, CAT score 8) Asthmatic bronchitis Active smoker  HPI: [name] is a 76 year old chief complaint of dyspnea on exertion for the past 2 years. This is associated with cough, white mucus production, chest congestion, wheezing. He is a current 1 pack per day smoker. He is on albuterol which helps with her symptoms. He had been tried on various inhalers including Advair, Breo and Spiriva which did not help.  Interim history: He has recurrent attacks of COPD exacerbation when he is in [place], likely due to low O2 levels. He feels okay when he is in [place]. He also has issues with anxiety that is worsening his dyspnea. C/o snoring, daytime sleepiness.   Outpatient Encounter Prescriptions as of 10/03/2016  Medication Sig  . albuterol (PROAIR HFA) 108 (90 Base) MCG/ACT inhaler Inhale 2 puffs into the lungs every 4 (four) hours as needed for wheezing or shortness of breath.  [name] Kitchen albuterol (PROVENTIL) (2.5 MG/3ML) 0.083% nebulizer solution Take 3 mLs (2.5 mg total) by nebulization 3 (three) times daily. Wheezing and shortness of breath  . arformoterol (BROVANA) 15 MCG/2ML NEBU Take 36mls by nebulization two times a day.  ICD 10 code J44.9.  [name] Kitchen budesonide (PULMICORT) 0.5 MG/2ML nebulizer solution Take 2 mLs (0.5 mg total) by nebulization 2 (two) times daily. ICD 10 code J44.9.  [name] Kitchen cholecalciferol (VITAMIN D) 1000 UNITS tablet Take 1,000 Units by mouth daily.   [name] Kitchen guaiFENesin (MUCINEX) 600 MG 12 hr tablet Take 1 tablet (600 mg total) by mouth 2 (two) times daily.  [name] Kitchen loratadine (CLARITIN) 10 MG tablet Take 1 tablet (10 mg total) by mouth daily.  [name] Kitchen losartan (COZAAR) 50 MG tablet Take 50 mg by mouth daily with breakfast.   . mometasone (NASONEX) 50 MCG/ACT nasal  spray Place 4 sprays into the nose daily.  . Naproxen Sodium (ALEVE) 220 MG CAPS Take 440 mg by mouth daily as needed (for pain). For pain  . Omega-3 Fatty Acids (FISH OIL PO) Take 1 capsule by mouth daily.  . sildenafil (REVATIO) 20 MG tablet Take 20-100 mg by mouth daily as needed (ED).  . simvastatin (ZOCOR) 40 MG tablet Take 40 mg by mouth at bedtime.   . vitamin B-12 (CYANOCOBALAMIN) 100 MCG tablet Take 100 mcg by mouth daily.   No facility-administered encounter medications on file as of 10/03/2016.     Allergies as of 10/03/2016 - Review Complete 10/03/2016  Allergen Reaction Noted  . Adhesive [tape] Rash 02/26/2012    Past Medical History:  Diagnosis Date  . Alcohol abuse   . Anemia   . Arthritis   . Asthma   . B12 deficiency 11/03/2013  . BPH (benign prostatic hypertrophy)   . COPD (chronic obstructive pulmonary disease) ([place])   . Dementia    ?  . Diabetes mellitus ([place]) 01/10/2012  . Femoral hernia, right 03/27/2012  . Glucose intolerance (impaired glucose tolerance)   . Hyperlipidemia   . Hypertension   . Incisional hernias - swiss-cheese-type periumbilical [id number]  . Inguinal hernia - right 01/10/2012  . Leukopenia 01/29/2013  . Monocytosis 01/29/2013  . Normochromic anemia 01/29/2013  . Osteopenia     Past Surgical History:  Procedure Laterality Date  . HEMORRHOID SURGERY    .  HERNIA REPAIR    . INGUINAL HERNIA REPAIR  02/24/2012   Procedure: LAPAROSCOPIC INGUINAL HERNIA;  Surgeon: Adin Hector, MD;  Location: [facility];  Service: General;  Laterality: N/A;  . TUMOR EXCISION     intestinal after SBO ( Benign), appendectomy  . VENTRAL HERNIA REPAIR  02/24/2012   Procedure: LAPAROSCOPIC VENTRAL HERNIA;  Surgeon: Adin Hector, MD;  Location: [facility];  Service: General;  Laterality: N/A;  lysis of adhesions, excision of abdominal wall lipomae    Family History  Problem Relation Age of Onset  . Lung cancer Father   . Lung cancer Mother   . Diabetes Brother      Social History   Social History  . Marital status: Single    Spouse name: N/A  . Number of children: 2  . Years of education: N/A   Occupational History  . Retired     Armed forces operational officer   Social History Main Topics  . Smoking status: Current Every Day Smoker    Packs/day: 1.00    Types: Cigarettes    Start date: 08/09/1955  . Smokeless tobacco: Never Used     Comment: quit smoking 06/08/2016  . Alcohol use No  . Drug use: No  . Sexual activity: Not on file   Other Topics Concern  . Not on file   Social History Narrative  . No narrative on file   Review of systems: Review of Systems  Constitutional: Negative for fever and chills.  HENT: Negative.   Eyes: Negative for blurred vision.  Respiratory: as per HPI  Cardiovascular: Negative for chest pain and palpitations.  Gastrointestinal: Negative for vomiting, diarrhea, blood per rectum. Genitourinary: Negative for dysuria, urgency, frequency and hematuria.  Musculoskeletal: Negative for myalgias, back pain and joint pain.  Skin: Negative for itching and rash.  Neurological: Negative for dizziness, tremors, focal weakness, seizures and loss of consciousness.  Endo/Heme/Allergies: Negative for environmental allergies.  Psychiatric/Behavioral: Negative for depression, suicidal ideas and hallucinations.  All other systems reviewed and are negative.   Physical Exam: Blood pressure 124/80, pulse (!) 55, height 6' (1.829 m), weight 86.2 kg (190 lb), SpO2 95 %. Gen:      No acute distress HEENT:  EOMI, sclera anicteric Neck:     No masses; no thyromegaly Lungs:    Clear to auscultation bilaterally; normal respiratory effort CV:         Regular rate and rhythm; no murmurs Abd:      + bowel sounds; soft, non-tender; no palpable masses, no distension Ext:    No edema; adequate peripheral perfusion Skin:      Warm and dry; no rash Neuro: alert and oriented x 3 Psych: normal mood and affect  Data Reviewed: CT chest 09/12/14 Mild  emphysematous changes noted bilaterally. Minimal posterior basilar scarring or subsegmental atelectasis is noted. No pulmonary mass or nodule is noted I have reviewed all images personally  PFTs 01/21/16 FVC 2.44 (71%) FEV1 1.47 [41%) F/F 43 TLC 122% RV/TLC 166% DLCO 48% Severe obstruction with reduction in diffusion capacity. Hyperinflation with air trapping.  Assessment:  Asthma COPD overlap syndrome His chief complaint is dyspnea on exertion and he had not tolerated many of the inhalers in the past. Switched to Pulmicort and brovana. PFTs were reviewed. Although they do not meet the formal criteria he does have a big improvement in FEV1 post bronchodilator suggestive of an asthmatic component.  Active smoker He had quit in past but is now back to smoking as  few cigarettes/day. I have encouraged him to quit completely.   Suspected OSA Order home sleep test.  Plan/Recommendations: - Encouraged to keep of cigarettes - Continue brovana, pulmicort and albuterol PRN. - Home sleep study.  Return in 3 months.    [name] MD [facility] Pager [phone] 10/03/2016, 9:32 AM  CC: Jani Gravel, MD

## 2016-10-04 DIAGNOSIS — C61 Malignant neoplasm of prostate: Secondary | ICD-10-CM | POA: Diagnosis not present

## 2016-10-04 DIAGNOSIS — J449 Chronic obstructive pulmonary disease, unspecified: Secondary | ICD-10-CM | POA: Diagnosis not present

## 2016-10-10 DIAGNOSIS — J449 Chronic obstructive pulmonary disease, unspecified: Secondary | ICD-10-CM | POA: Diagnosis not present

## 2016-10-25 ENCOUNTER — Telehealth: Payer: Self-pay | Admitting: Pulmonary Disease

## 2016-10-25 NOTE — Telephone Encounter (Signed)
Dr Vaughan Browner,  Please advise if you are okay with the pt taking an aleve PM the night of his sleep study, thanks

## 2016-10-26 NOTE — Telephone Encounter (Signed)
I would advise to avoid aleve PM as it has diphenhydramine which can interfere with the sleep study. OK to take just aleve.

## 2016-10-26 NOTE — Telephone Encounter (Signed)
Spoke with pt and made him aware of PM message. Pt understood and had no further questions. Nothing further is needed at this time.

## 2016-11-09 DIAGNOSIS — Z0000 Encounter for general adult medical examination without abnormal findings: Secondary | ICD-10-CM | POA: Diagnosis not present

## 2016-11-09 DIAGNOSIS — I10 Essential (primary) hypertension: Secondary | ICD-10-CM | POA: Diagnosis not present

## 2016-11-09 DIAGNOSIS — H9113 Presbycusis, bilateral: Secondary | ICD-10-CM | POA: Diagnosis not present

## 2016-11-09 DIAGNOSIS — K08409 Partial loss of teeth, unspecified cause, unspecified class: Secondary | ICD-10-CM | POA: Diagnosis not present

## 2016-11-09 DIAGNOSIS — J449 Chronic obstructive pulmonary disease, unspecified: Secondary | ICD-10-CM | POA: Diagnosis not present

## 2016-11-09 DIAGNOSIS — Z6827 Body mass index (BMI) 27.0-27.9, adult: Secondary | ICD-10-CM | POA: Diagnosis not present

## 2016-11-09 DIAGNOSIS — Z79899 Other long term (current) drug therapy: Secondary | ICD-10-CM | POA: Diagnosis not present

## 2016-11-09 DIAGNOSIS — Z8546 Personal history of malignant neoplasm of prostate: Secondary | ICD-10-CM | POA: Diagnosis not present

## 2016-11-09 DIAGNOSIS — R69 Illness, unspecified: Secondary | ICD-10-CM | POA: Diagnosis not present

## 2016-11-09 DIAGNOSIS — E785 Hyperlipidemia, unspecified: Secondary | ICD-10-CM | POA: Diagnosis not present

## 2016-11-13 DIAGNOSIS — R69 Illness, unspecified: Secondary | ICD-10-CM | POA: Diagnosis not present

## 2016-11-14 DIAGNOSIS — G4733 Obstructive sleep apnea (adult) (pediatric): Secondary | ICD-10-CM | POA: Diagnosis not present

## 2016-11-17 ENCOUNTER — Other Ambulatory Visit: Payer: Self-pay | Admitting: *Deleted

## 2016-11-17 DIAGNOSIS — G4733 Obstructive sleep apnea (adult) (pediatric): Secondary | ICD-10-CM | POA: Diagnosis not present

## 2016-11-17 DIAGNOSIS — G4730 Sleep apnea, unspecified: Secondary | ICD-10-CM

## 2016-11-22 ENCOUNTER — Telehealth: Payer: Self-pay

## 2016-11-22 ENCOUNTER — Other Ambulatory Visit: Payer: Self-pay

## 2016-11-22 DIAGNOSIS — G4733 Obstructive sleep apnea (adult) (pediatric): Secondary | ICD-10-CM

## 2016-11-22 DIAGNOSIS — J449 Chronic obstructive pulmonary disease, unspecified: Secondary | ICD-10-CM

## 2016-11-22 NOTE — Telephone Encounter (Signed)
Created in error

## 2016-12-13 DIAGNOSIS — G4733 Obstructive sleep apnea (adult) (pediatric): Secondary | ICD-10-CM | POA: Diagnosis not present

## 2016-12-27 ENCOUNTER — Telehealth: Payer: Self-pay | Admitting: Pulmonary Disease

## 2016-12-27 DIAGNOSIS — J449 Chronic obstructive pulmonary disease, unspecified: Secondary | ICD-10-CM | POA: Diagnosis not present

## 2016-12-27 NOTE — Telephone Encounter (Signed)
Pt needs to reschedule his appt d/t just now getting CPAP machine. Pt needs an appt around June 11 or 12 but I do not see an opening. Please advise if able to work this patient in around this date. Pt states that if unable to get worked in on June 11-12 then he will need an early morning appt about 9am -- pt needs Monday or Tuesday appts.  Pt refused to see NP. Please advise Dr Vaughan Browner. Thanks.

## 2016-12-28 NOTE — Telephone Encounter (Signed)
Ok to work in, See if he can be put in a held spot.  PM

## 2016-12-28 NOTE — Telephone Encounter (Signed)
Pt is needing an appt AFTER June 8th, no sooner per insurance. Pt has to have 30 days of use on CPAP machine documented.  Pt needs Monday or Tuesday appts or appts at 9am any other day.  Please advise if you are able to work in at a 9am appt slot or if he needs to see NP.

## 2016-12-30 NOTE — Telephone Encounter (Signed)
If he refuses to see an NP then I can open a visit on 6/13 in afternoon. My next am clinics are 7/30, 31, or 8/2. I can see him at 8:45 am on those days.  PM

## 2016-12-30 NOTE — Telephone Encounter (Signed)
PM please advise.thanks  

## 2016-12-30 NOTE — Telephone Encounter (Signed)
Pt has been scheduled with TP on 01/17/17 @ 9:30. Pt is aware and voiced his understanding. Nothing further needed.

## 2017-01-03 ENCOUNTER — Ambulatory Visit: Payer: Medicare HMO | Admitting: Pulmonary Disease

## 2017-01-10 ENCOUNTER — Ambulatory Visit: Payer: Medicare HMO | Admitting: Pulmonary Disease

## 2017-01-10 DIAGNOSIS — J449 Chronic obstructive pulmonary disease, unspecified: Secondary | ICD-10-CM | POA: Diagnosis not present

## 2017-01-13 DIAGNOSIS — J441 Chronic obstructive pulmonary disease with (acute) exacerbation: Secondary | ICD-10-CM | POA: Diagnosis not present

## 2017-01-17 ENCOUNTER — Ambulatory Visit: Payer: Medicare HMO | Admitting: Adult Health

## 2017-01-18 ENCOUNTER — Encounter: Payer: Self-pay | Admitting: Pulmonary Disease

## 2017-01-18 ENCOUNTER — Ambulatory Visit (INDEPENDENT_AMBULATORY_CARE_PROVIDER_SITE_OTHER): Payer: Medicare HMO | Admitting: Pulmonary Disease

## 2017-01-18 VITALS — BP 138/68 | HR 105 | Ht 72.0 in | Wt 200.2 lb

## 2017-01-18 DIAGNOSIS — G4730 Sleep apnea, unspecified: Secondary | ICD-10-CM | POA: Diagnosis not present

## 2017-01-18 DIAGNOSIS — R0600 Dyspnea, unspecified: Secondary | ICD-10-CM

## 2017-01-18 MED ORDER — UMECLIDINIUM-VILANTEROL 62.5-25 MCG/INH IN AEPB: 1.0000 | INHALATION_SPRAY | Freq: Every day | RESPIRATORY_TRACT | 0 refills | Status: DC

## 2017-01-18 NOTE — Patient Instructions (Signed)
1.  We will trial Anoro > use 1 puff inhaled Daily  2.  You can use your Albuterol as needed for rescue / shortness of breath 3.  Keep your Brovana & Pulmicort > do not throw away but do not use with the Anoro 4.  Wear your CPAP machine daily > we will ask the home care agency to review your mask  5.  We will write for a new nebulizer machine 6.  Return in 3 months for follow up on your CPAP / sleep apnea

## 2017-01-18 NOTE — Progress Notes (Signed)
[facility]   Chief Complaint  Patient presents with  . Follow-up    f/u COPD,  CPAP,  for about a month, DME is [facility], wants to try Anoro, SOB off and on, pt wants to try Anoro     Primary Pulmonologist: Dr. Vaughan Browner  Current Outpatient Prescriptions on File Prior to Visit  Medication Sig  . albuterol (PROAIR HFA) 108 (90 Base) MCG/ACT inhaler Inhale 2 puffs into the lungs every 4 (four) hours as needed for wheezing or shortness of breath.  [name] Kitchen albuterol (PROVENTIL) (2.5 MG/3ML) 0.083% nebulizer solution Take 3 mLs (2.5 mg total) by nebulization 3 (three) times daily. Wheezing and shortness of breath  . arformoterol (BROVANA) 15 MCG/2ML NEBU Take 35mls by nebulization two times a day.  ICD 10 code J44.9.  [name] Kitchen budesonide (PULMICORT) 0.5 MG/2ML nebulizer solution Take 2 mLs (0.5 mg total) by nebulization 2 (two) times daily. ICD 10 code J44.9.  [name] Kitchen cholecalciferol (VITAMIN D) 1000 UNITS tablet Take 1,000 Units by mouth daily.   [name] Kitchen loratadine (CLARITIN) 10 MG tablet Take 1 tablet (10 mg total) by mouth daily.  [name] Kitchen losartan (COZAAR) 50 MG tablet Take 50 mg by mouth daily with breakfast.   . mometasone (NASONEX) 50 MCG/ACT nasal spray Place 4 sprays into the nose daily.  . Naproxen Sodium (ALEVE) 220 MG CAPS Take 440 mg by mouth daily as needed (for pain). For pain  . simvastatin (ZOCOR) 40 MG tablet Take 40 mg by mouth at bedtime.   . vitamin B-12 (CYANOCOBALAMIN) 100 MCG tablet Take 100 mcg by mouth daily.   No current facility-administered medications on file prior to visit.      Studies: CT chest 09/12/14 Mild emphysematous changes noted bilaterally. Minimal posterior basilar scarring or subsegmental atelectasis is noted. No pulmonary mass or nodule is noted I have reviewed all images personally  PFTs 01/21/16 FVC 2.44 (71%) FEV1 1.47 [41%) F/F 43 TLC 122% RV/TLC 166% DLCO 48% Severe obstruction with reduction in diffusion capacity. Hyperinflation with air trapping.   Past  Medical Hx:  has a past medical history of Alcohol abuse; Anemia; Arthritis; Asthma; B12 deficiency (11/03/2013); BPH (benign prostatic hypertrophy); COPD (chronic obstructive pulmonary disease) ([place]); Dementia; Diabetes mellitus ([place]) (01/10/2012); Femoral hernia, right (03/27/2012); Glucose intolerance (impaired glucose tolerance); Hyperlipidemia; Hypertension; Incisional hernias - swiss-cheese-type periumbilical (04/15/3531); Inguinal hernia - right (01/10/2012); Leukopenia (01/29/2013); Monocytosis (01/29/2013); Normochromic anemia (01/29/2013); and Osteopenia.   Past Surgical hx, Allergies, Family hx, Social hx all reviewed.  Vital Signs BP 138/68 (BP Location: Right Arm, Cuff Size: Normal)   Pulse (!) 105   Ht 6' (1.829 m)   Wt 200 lb 3.2 oz (90.8 kg)   SpO2 93%   BMI 27.15 kg/m   History of Present Illness Cory Kemp is a 76 y.o. male with a history of GOLD C COPD (FEV1 41%, CAT score 8), asthmatic bronchitis and active smoker who presented to the pulmonary office to follow up on CPAP compliance.    Patient reports he has had his CPAP machine for approximately a month and a half. He states his mask is ripped and has significant air leak from the right side of the mask.  [name] was difficult to get used to wearing the mask. He states he typically sleeps 5-6 hours per night with frequent awakenings. Currently he states he cannot tell a difference with or without the CPAP.  He has been on Brovana and Pulmicort with PRN albuterol.  He states it has been taking him 3  hours to complete his brovana + pulmicort with his home nebulizer machine (machine is ~ 76 years old).  Currently he is using 3 albuterol per day. [place] lot.  He notes exertion is typically when he has shortness of breath and feels the albuterol helps him the most. He is concerned over cost of medications. He states a friend recommended Anoro and he would like to try a  sample.   Physical Exam  General - well developed adult M in no acute distress ENT - No sinus tenderness, no oral exudate, no LAN Cardiac - s1s2 regular, no murmur Chest - even/non-labored, lungs bilaterally clear / slightly diminished.  No wheeze/rales Back - No focal tenderness Abd - Soft, non-tender Ext - No edema Neuro - Normal strength Skin - No rashes Psych - normal mood, and behavior   Assessment/Plan  Discussion: 76 y/o M with GOLD C COPD and OSA seen in the clinic for follow up on OSA.   GOLD C COPD  Asthmatic Bronchitis  Tobacco Abuse  OSA   Plan: Will given trial of Anoro, pt instructed not to throw away bronvna / pulmicort Trial Anoro with PRN albuterol  Follow up with Dr. Vaughan Browner to review CPAP / sleep apnea  Encouraged CPAP use.  Have asked home care agency to review mask given tear / leak CPAP use on download shows 67% compliance with average of 3 hours use > reviewed need for use  New nebulizer machine ordered for patient > should not take 3 hours to complete nebs  Patient Instructions  1.  We will trial Anoro > use 1 puff inhaled Daily  2.  You can use your Albuterol as needed for rescue / shortness of breath 3.  Keep your Brovana & Pulmicort > do not throw away but do not use with the Anoro 4.  Wear your CPAP machine daily > we will ask the home care agency to review your mask  5.  We will write for a new nebulizer machine 6.  Return in 3 months for follow up on your CPAP / sleep apnea    Noe Gens, NP-C [place]  [phone] 01/18/2017, 2:44 PM

## 2017-01-18 NOTE — Progress Notes (Signed)
Patient seen in the office today and instructed on use of Anoro.  Patient expressed understanding and demonstrated technique. 

## 2017-02-12 DIAGNOSIS — J441 Chronic obstructive pulmonary disease with (acute) exacerbation: Secondary | ICD-10-CM | POA: Diagnosis not present

## 2017-02-15 ENCOUNTER — Telehealth: Payer: Self-pay | Admitting: Pulmonary Disease

## 2017-02-15 MED ORDER — UMECLIDINIUM-VILANTEROL 62.5-25 MCG/INH IN AEPB: 1.0000 | INHALATION_SPRAY | Freq: Every day | RESPIRATORY_TRACT | 6 refills | Status: DC

## 2017-02-15 MED ORDER — UMECLIDINIUM-VILANTEROL 62.5-25 MCG/INH IN AEPB: 1.0000 | INHALATION_SPRAY | Freq: Every day | RESPIRATORY_TRACT | 0 refills | Status: AC

## 2017-02-15 NOTE — Telephone Encounter (Signed)
Spoke with pt, who states during his OV with Noe Gens on 01/18/17, he was given a sample of Anoro. Pt states he feels that Anoro is effective an request for Rx to be sent to [facility] on [address]. Pt states he has spoken with [facility], who states they will have to order this medication. Pt request a sample of Anoro until medication is received by pharmacy. Pt aware that one sample of Anoro will be placed up front for pick up. Rx has been sent to preferred pharmacy. Nothing further needed.

## 2017-03-13 DIAGNOSIS — E7800 Pure hypercholesterolemia, unspecified: Secondary | ICD-10-CM | POA: Diagnosis not present

## 2017-03-13 DIAGNOSIS — Z0000 Encounter for general adult medical examination without abnormal findings: Secondary | ICD-10-CM | POA: Diagnosis not present

## 2017-03-20 DIAGNOSIS — E7800 Pure hypercholesterolemia, unspecified: Secondary | ICD-10-CM | POA: Diagnosis not present

## 2017-03-20 DIAGNOSIS — I10 Essential (primary) hypertension: Secondary | ICD-10-CM | POA: Diagnosis not present

## 2017-03-20 DIAGNOSIS — J449 Chronic obstructive pulmonary disease, unspecified: Secondary | ICD-10-CM | POA: Diagnosis not present

## 2017-03-20 DIAGNOSIS — Z720 Tobacco use: Secondary | ICD-10-CM | POA: Diagnosis not present

## 2017-03-20 DIAGNOSIS — R972 Elevated prostate specific antigen [PSA]: Secondary | ICD-10-CM | POA: Diagnosis not present

## 2017-04-04 DIAGNOSIS — R69 Illness, unspecified: Secondary | ICD-10-CM | POA: Diagnosis not present

## 2017-04-25 ENCOUNTER — Encounter: Payer: Self-pay | Admitting: Pulmonary Disease

## 2017-04-25 ENCOUNTER — Ambulatory Visit (INDEPENDENT_AMBULATORY_CARE_PROVIDER_SITE_OTHER): Payer: Medicare HMO | Admitting: Pulmonary Disease

## 2017-04-25 VITALS — BP 140/80 | HR 90 | Ht 71.0 in | Wt 202.2 lb

## 2017-04-25 DIAGNOSIS — G4733 Obstructive sleep apnea (adult) (pediatric): Secondary | ICD-10-CM

## 2017-04-25 MED ORDER — FLUTICASONE-UMECLIDIN-VILANT 100-62.5-25 MCG/INH IN AEPB: 1.0000 | INHALATION_SPRAY | Freq: Every day | RESPIRATORY_TRACT | 0 refills | Status: DC

## 2017-04-25 NOTE — Progress Notes (Signed)
FINLEY CHEVEZ    [id number]    Jun 05, 1941  Primary Care Physician:Kim, Jeneen Rinks, MD  Referring Physician: Jani Gravel, MD [address] [place], [place] [address]  Chief complaint:   Follow up for COPD GOLD C (FEV1 41%, CAT score 8) Asthmatic bronchitis Active smoker  HPI: Mr. [name] is a 76 year old chief complaint of dyspnea on exertion for the past 2 years. This is associated with cough, white mucus production, chest congestion, wheezing. He is a current 1 pack per day smoker. He is on albuterol which helps with her symptoms. He had been tried on various inhalers including Advair, Breo and Spiriva which did not help.  Interim history: At last visit he was given samples of anoro. He feels this helps with his breathing but he cannot afford the medication. He is currently just using albuterol inhaler 3 times daily. He reports worsening dyspnea with wheezing.  Outpatient Encounter Prescriptions as of 04/25/2017  Medication Sig  . albuterol (PROAIR HFA) 108 (90 Base) MCG/ACT inhaler Inhale 2 puffs into the lungs every 4 (four) hours as needed for wheezing or shortness of breath.  [name] Kitchen albuterol (PROVENTIL) (2.5 MG/3ML) 0.083% nebulizer solution Take 2.5 mg by nebulization every 6 (six) hours as needed for wheezing or shortness of breath.  . budesonide (PULMICORT) 0.5 MG/2ML nebulizer solution Take 2 mLs (0.5 mg total) by nebulization 2 (two) times daily. ICD 10 code J44.9.  [name] Kitchen cholecalciferol (VITAMIN D) 1000 UNITS tablet Take 1,000 Units by mouth daily.   [name] Kitchen loratadine (CLARITIN) 10 MG tablet Take 1 tablet (10 mg total) by mouth daily.  [name] Kitchen losartan (COZAAR) 50 MG tablet Take 50 mg by mouth daily with breakfast.   . mometasone (NASONEX) 50 MCG/ACT nasal spray Place 4 sprays into the nose daily.  . Naproxen Sodium (ALEVE) 220 MG CAPS Take 440 mg by mouth daily as needed (for pain). For pain  . simvastatin (ZOCOR) 40 MG tablet Take 40 mg by mouth at bedtime.   [name] Kitchen  umeclidinium-vilanterol (ANORO ELLIPTA) 62.5-25 MCG/INH AEPB Inhale 1 puff into the lungs daily.  . vitamin B-12 (CYANOCOBALAMIN) 100 MCG tablet Take 100 mcg by mouth daily.  . [DISCONTINUED] albuterol (PROVENTIL) (2.5 MG/3ML) 0.083% nebulizer solution Take 3 mLs (2.5 mg total) by nebulization 3 (three) times daily. Wheezing and shortness of breath  . [DISCONTINUED] arformoterol (BROVANA) 15 MCG/2ML NEBU Take 52mls by nebulization two times a day.  ICD 10 code J44.9.   No facility-administered encounter medications on file as of 04/25/2017.     Allergies as of 04/25/2017 - Review Complete 04/25/2017  Allergen Reaction Noted  . Adhesive [tape] Rash 02/26/2012    Past Medical History:  Diagnosis Date  . Alcohol abuse   . Anemia   . Arthritis   . Asthma   . B12 deficiency 11/03/2013  . BPH (benign prostatic hypertrophy)   . COPD (chronic obstructive pulmonary disease) ([place])   . Dementia    ?  . Diabetes mellitus ([place]) 01/10/2012  . Femoral hernia, right 03/27/2012  . Glucose intolerance (impaired glucose tolerance)   . Hyperlipidemia   . Hypertension   . Incisional hernias - swiss-cheese-type periumbilical 6/0/1093  . Inguinal hernia - right 01/10/2012  . Leukopenia 01/29/2013  . Monocytosis 01/29/2013  . Normochromic anemia 01/29/2013  . Osteopenia     Past Surgical History:  Procedure Laterality Date  . HEMORRHOID SURGERY    . HERNIA REPAIR    . INGUINAL HERNIA REPAIR  02/24/2012   Procedure: LAPAROSCOPIC INGUINAL HERNIA;  Surgeon: Adin Hector, MD;  Location: [facility];  Service: General;  Laterality: N/A;  . TUMOR EXCISION     intestinal after SBO ( Benign), appendectomy  . VENTRAL HERNIA REPAIR  02/24/2012   Procedure: LAPAROSCOPIC VENTRAL HERNIA;  Surgeon: Adin Hector, MD;  Location: [facility];  Service: General;  Laterality: N/A;  lysis of adhesions, excision of abdominal wall lipomae    Family History  Problem Relation Age of Onset  . Lung cancer Father   . Lung cancer  Mother   . Diabetes Brother     Social History   Social History  . Marital status: Single    Spouse name: N/A  . Number of children: 2  . Years of education: N/A   Occupational History  . Retired     Armed forces operational officer   Social History Main Topics  . Smoking status: Current Every Day Smoker    Packs/day: 1.00    Types: Cigarettes    Start date: 08/09/1955  . Smokeless tobacco: Never Used     Comment: quit smoking 06/08/2016  . Alcohol use No  . Drug use: No  . Sexual activity: Not on file   Other Topics Concern  . Not on file   Social History Narrative  . No narrative on file   Review of systems: Review of Systems  Constitutional: Negative for fever and chills.  HENT: Negative.   Eyes: Negative for blurred vision.  Respiratory: as per HPI  Cardiovascular: Negative for chest pain and palpitations.  Gastrointestinal: Negative for vomiting, diarrhea, blood per rectum. Genitourinary: Negative for dysuria, urgency, frequency and hematuria.  Musculoskeletal: Negative for myalgias, back pain and joint pain.  Skin: Negative for itching and rash.  Neurological: Negative for dizziness, tremors, focal weakness, seizures and loss of consciousness.  Endo/Heme/Allergies: Negative for environmental allergies.  Psychiatric/Behavioral: Negative for depression, suicidal ideas and hallucinations.  All other systems reviewed and are negative.   Physical Exam: Blood pressure 140/80, pulse 90, height 5\' 11"  (1.803 m), weight 202 lb 4 oz (91.7 kg), SpO2 99 %. Gen:      No acute distress HEENT:  EOMI, sclera anicteric Neck:     No masses; no thyromegaly Lungs:    Clear to auscultation bilaterally; normal respiratory effort CV:         Regular rate and rhythm; no murmurs Abd:      + bowel sounds; soft, non-tender; no palpable masses, no distension Ext:    No edema; adequate peripheral perfusion Skin:      Warm and dry; no rash Neuro: alert and oriented x 3 Psych: normal mood and affect  Data  Reviewed: CT chest 09/12/14 Mild emphysematous changes noted bilaterally. Minimal posterior basilar scarring or subsegmental atelectasis is noted. No pulmonary mass or nodule is noted I have reviewed all images personally  PFTs 01/21/16 FVC 2.44 (71%) FEV1 1.47 [41%) F/F 43 TLC 122% RV/TLC 166% DLCO 48% Severe obstruction with reduction in diffusion capacity. Hyperinflation with air trapping.  Home sleep test 11/14/16 Moderate OSA with desats.  Assessment:  Asthma COPD overlap syndrome His chief complaint is dyspnea on exertion and he had not tolerated many of the inhalers in the past. Cannot afford the brovana, Pulmicort or anoro.. Although they do not meet the formal criteria he does have a big improvement in FEV1 post bronchodilator suggestive of an asthmatic component. He will need to have inhaled corticosteroids as part of his inhaler regimen.  I  will give him sample and coupons for trelegy. He will check with his insurance company for a list of covered meds  Active smoker He had quit in past but is now back to smoking as few cigarettes/day. I have encouraged him to quit completely.   Moderate OSA Not using CPAP due to broken mask. Will order new masl.  Plan/Recommendations: - Encouraged to keep of cigarettes - Start trelegy - New CPAP mask  Return in 3 months.    [name] MD [facility] Pager [phone] 04/25/2017, 9:20 AM  CC: Jani Gravel, MD

## 2017-04-25 NOTE — Patient Instructions (Signed)
We will give sample of trelegy Use this instead of anoro and nebulizers We will order a new mask through DME company.  Follow up in 6 month

## 2017-04-25 NOTE — Addendum Note (Signed)
Addended by: Della Goo C on: 04/25/2017 09:41 AM   Modules accepted: Orders

## 2017-05-15 DIAGNOSIS — J441 Chronic obstructive pulmonary disease with (acute) exacerbation: Secondary | ICD-10-CM | POA: Diagnosis not present

## 2017-06-13 ENCOUNTER — Encounter: Payer: Self-pay | Admitting: Adult Health

## 2017-06-13 ENCOUNTER — Ambulatory Visit (INDEPENDENT_AMBULATORY_CARE_PROVIDER_SITE_OTHER)
Admission: RE | Admit: 2017-06-13 | Discharge: 2017-06-13 | Disposition: A | Discharge status: Home / Self Care | Payer: Medicare HMO | Source: Ambulatory Visit | Attending: Adult Health | Admitting: Adult Health

## 2017-06-13 ENCOUNTER — Ambulatory Visit: Payer: Medicare HMO | Admitting: Adult Health

## 2017-06-13 ENCOUNTER — Telehealth: Payer: Self-pay | Admitting: Adult Health

## 2017-06-13 VITALS — BP 134/72 | HR 97 | Ht 73.0 in | Wt 199.5 lb

## 2017-06-13 DIAGNOSIS — R05 Cough: Secondary | ICD-10-CM | POA: Diagnosis not present

## 2017-06-13 DIAGNOSIS — J441 Chronic obstructive pulmonary disease with (acute) exacerbation: Secondary | ICD-10-CM

## 2017-06-13 DIAGNOSIS — G4733 Obstructive sleep apnea (adult) (pediatric): Secondary | ICD-10-CM | POA: Diagnosis not present

## 2017-06-13 DIAGNOSIS — R0602 Shortness of breath: Secondary | ICD-10-CM | POA: Diagnosis not present

## 2017-06-13 DIAGNOSIS — Z720 Tobacco use: Secondary | ICD-10-CM

## 2017-06-13 MED ORDER — ALBUTEROL SULFATE HFA 108 (90 BASE) MCG/ACT IN AERS: 2.0000 | INHALATION_SPRAY | RESPIRATORY_TRACT | 5 refills | Status: DC | PRN

## 2017-06-13 MED ORDER — UMECLIDINIUM-VILANTEROL 62.5-25 MCG/INH IN AEPB: 1.0000 | INHALATION_SPRAY | Freq: Every day | RESPIRATORY_TRACT | 3 refills | Status: DC

## 2017-06-13 NOTE — Telephone Encounter (Signed)
Sorry Cory Kemp said she did not that  Can you send a starter pack and then maintence . W/ 3 refills  Make sure ov follow up on the books  Please contact office for sooner follow up if symptoms do not improve or worsen or seek emergency care

## 2017-06-13 NOTE — Patient Instructions (Addendum)
Restart ANORO 1 puff daily.  Activity tolerated .  Chest xray  Today .  Work on not smoking .  Restart CPAP At bedtime   Order for new CPAP mask .  Chantix prescription to help with stop smoking .  Follow up with Dr. Vaughan Browner in 3 months and As needed   Please contact office for sooner follow up if symptoms do not improve or worsen or seek emergency care

## 2017-06-13 NOTE — Telephone Encounter (Signed)
lmtcb x1 for pt's daughter, [name]. Will send in Rx after speaking with [name].

## 2017-06-13 NOTE — Progress Notes (Signed)
@Patient  ID: Cory Kemp, male    DOB: 1941/03/12, 76 y.o.   MRN: [id number]  Chief Complaint  Patient presents with  . Follow-up    COPD     Referring provider: Jani Gravel, MD  HPI: 76 yo male smoker followed for GOLD III COPD , OSA  TEST  PFTs 01/21/16 FVC 2.44 (71%) FEV1 1.47 [41%) F/F 43 TLC 122% RV/TLC 166% DLCO 48% Severe obstruction with reduction in diffusion capacity. Hyperinflation with air trapping.  Home sleep test 11/14/16 Moderate OSA with desats.   06/13/17 Follow up  : COPD  Pt returns for follow up . He complains of that he feels breathing is going down over last year . Gets winded easily . He was tried on TRELEGY last office visit in September , says insurance did not cover. Was previously on [facility] but did not restart.  He is very active , travels a lot.   PFT last year with severe COPD with FEV1 at 41%, ratio 43. Daughter is with him today and wants to understand his COPD better. Questions answered.  He denies fever, chest pain, orthopnea, edema or hemoptysis .   He still smokes. Smoking cessation discussed.. Would like precription for Chantix. Pt education given on Chantix and quit smoking helpful hints.    He has moderate OSA on CPAP . Says he is not using CPAP lately due to mask issues. We discussed complaince.   Allergies  Allergen Reactions  . Adhesive [Tape] Rash     There is no immunization history on file for this patient.  Past Medical History:  Diagnosis Date  . Alcohol abuse   . Anemia   . Arthritis   . Asthma   . B12 deficiency 11/03/2013  . BPH (benign prostatic hypertrophy)   . COPD (chronic obstructive pulmonary disease) ([place])   . Dementia    ?  . Diabetes mellitus ([place]) 01/10/2012  . Femoral hernia, right 03/27/2012  . Glucose intolerance (impaired glucose tolerance)   . Hyperlipidemia   . Hypertension   . Incisional hernias - swiss-cheese-type periumbilical 4/0/9811  . Inguinal hernia - right 01/10/2012  . Leukopenia  01/29/2013  . Monocytosis 01/29/2013  . Normochromic anemia 01/29/2013  . Osteopenia     Tobacco History: Social History   Tobacco Use  Smoking Status Current Every Day Smoker  . Packs/day: 1.00  . Types: Cigarettes  . Start date: 08/09/1955  Smokeless Tobacco Never Used  Tobacco Comment   quit smoking 06/08/2016   Ready to quit: Not Answered Counseling given: Not Answered Comment: quit smoking 06/08/2016   Outpatient Encounter Medications as of 06/13/2017  Medication Sig  . albuterol (PROVENTIL) (2.5 MG/3ML) 0.083% nebulizer solution Take 2.5 mg by nebulization every 6 (six) hours as needed for wheezing or shortness of breath.  . cholecalciferol (VITAMIN D) 1000 UNITS tablet Take 1,000 Units by mouth daily.   [name] Kitchen losartan (COZAAR) 50 MG tablet Take 50 mg by mouth daily with breakfast.   . Naproxen Sodium (ALEVE) 220 MG CAPS Take 440 mg by mouth daily as needed (for pain). For pain  . simvastatin (ZOCOR) 40 MG tablet Take 40 mg by mouth at bedtime.   . vitamin B-12 (CYANOCOBALAMIN) 100 MCG tablet Take 100 mcg by mouth daily.  . [DISCONTINUED] albuterol (PROAIR HFA) 108 (90 Base) MCG/ACT inhaler Inhale 2 puffs into the lungs every 4 (four) hours as needed for wheezing or shortness of breath.  . [DISCONTINUED] Fluticasone-Umeclidin-Vilant (TRELEGY ELLIPTA) 100-62.5-25 MCG/INH AEPB Inhale 1 puff  into the lungs daily.  . [DISCONTINUED] loratadine (CLARITIN) 10 MG tablet Take 1 tablet (10 mg total) by mouth daily.  . [DISCONTINUED] mometasone (NASONEX) 50 MCG/ACT nasal spray Place 4 sprays into the nose daily.  [name] Kitchen umeclidinium-vilanterol (ANORO ELLIPTA) 62.5-25 MCG/INH AEPB Inhale 1 puff daily into the lungs.   No facility-administered encounter medications on file as of 06/13/2017.      Review of Systems  Constitutional:   No  weight loss, night sweats,  Fevers, chills, + fatigue, or  lassitude.  HEENT:   No headaches,  Difficulty swallowing,  Tooth/dental problems, or  Sore throat,                 No sneezing, itching, ear ache, nasal congestion, post nasal drip,   CV:  No chest pain,  Orthopnea, PND, swelling in lower extremities, anasarca, dizziness, palpitations, syncope.   GI  No heartburn, indigestion, abdominal pain, nausea, vomiting, diarrhea, change in bowel habits, loss of appetite, bloody stools.   Resp:    No chest wall deformity  Skin: no rash or lesions.  GU: no dysuria, change in color of urine, no urgency or frequency.  No flank pain, no hematuria   MS:  No joint pain or swelling.  No decreased range of motion.  No back pain.    Physical Exam    GEN: A/Ox3; pleasant , NAD, elderly    HEENT:  [place]/AT,  EACs-clear, TMs-wnl, NOSE-clear, THROAT-clear, no lesions, no postnasal drip or exudate noted.   NECK:  Supple w/ fair ROM; no JVD; normal carotid impulses w/o bruits; no thyromegaly or nodules palpated; no lymphadenopathy.    RESP  Clear  P & A; w/o, wheezes/ rales/ or rhonchi. no accessory muscle use, no dullness to percussion  CARD:  RRR, no m/r/g, no peripheral edema, pulses intact, no cyanosis or clubbing.  GI:   Soft & nt; nml bowel sounds; no organomegaly or masses detected.   Musco: Warm bil, no deformities or joint swelling noted.   Neuro: alert, no focal deficits noted.    Skin: Warm, no lesions or rashes    Lab Results:   BMET Imaging: Dg Chest 2 View  Result Date: 06/13/2017 CLINICAL DATA:  Routine chronic cough with shortness of breath. EXAM: CHEST  2 VIEW COMPARISON:  04/10/2016. FINDINGS: Normal cardiomediastinal silhouette. Aortic atherosclerosis. Severe COPD is redemonstrated, stable. No consolidation, or edema. No effusion or pneumothorax. Degenerative change lumbar spine. Similar appearance to priors. IMPRESSION: COPD.  No active lung disease. Aortic atherosclerosis. Electronically Signed   By: Staci Righter M.D.   On: 06/13/2017 17:32     Assessment & Plan:   COPD (chronic obstructive pulmonary disease)  (HCC) Severe COPD w/ increased sx off maintenance inhalers and supsect gradual decline w/ ongoing smoking  Check cxr today  Restart LAMA /LABA.   Plan  Patient Instructions  Restart ANORO 1 puff daily.  Activity tolerated .  Chest xray  Today .  Work on not smoking .  Restart CPAP At bedtime   Order for new CPAP mask .  Chantix prescription to help with stop smoking .  Follow up with Dr. Vaughan Browner in 3 months and As needed   Please contact office for sooner follow up if symptoms do not improve or worsen or seek emergency care      Tobacco abuse Smoking cessation ,  chantix rx given w/ pt education   OSA (obstructive sleep apnea) Restart CPAP  New mask rx  Plan  Patient Instructions  Restart ANORO 1 puff daily.  Activity tolerated .  Chest xray  Today .  Work on not smoking .  Restart CPAP At bedtime   Order for new CPAP mask .  Chantix prescription to help with stop smoking .  Follow up with Dr. Vaughan Browner in 3 months and As needed   Please contact office for sooner follow up if symptoms do not improve or worsen or seek emergency care         Rexene Edison, NP

## 2017-06-13 NOTE — Telephone Encounter (Signed)
Refill has been in for the proair hfa  pts daughter stated that the chantix rx was not sent in to the pharmacy so they could not pick this up.  Need to know what to send in for this please. thanks  She was also following up with Jess about the support group?  JJ any recs for this?  Thanks

## 2017-06-14 DIAGNOSIS — G4733 Obstructive sleep apnea (adult) (pediatric): Secondary | ICD-10-CM | POA: Insufficient documentation

## 2017-06-14 MED ORDER — VARENICLINE TARTRATE 0.5 MG PO TABS: 0.5000 mg | ORAL_TABLET | Freq: Two times a day (BID) | ORAL | 1 refills | Status: DC

## 2017-06-14 MED ORDER — VARENICLINE TARTRATE 0.5 MG X 11 & 1 MG X 42 PO MISC: ORAL | 0 refills | Status: DC

## 2017-06-14 NOTE — Assessment & Plan Note (Signed)
Smoking cessation ,  chantix rx given w/ pt education

## 2017-06-14 NOTE — Telephone Encounter (Signed)
Pt's daughter returned phone call from yesterday. [phone]

## 2017-06-14 NOTE — Assessment & Plan Note (Signed)
Restart CPAP  New mask rx  Plan  Patient Instructions  Restart ANORO 1 puff daily.  Activity tolerated .  Chest xray  Today .  Work on not smoking .  Restart CPAP At bedtime   Order for new CPAP mask .  Chantix prescription to help with stop smoking .  Follow up with Dr. Vaughan Browner in 3 months and As needed   Please contact office for sooner follow up if symptoms do not improve or worsen or seek emergency care

## 2017-06-14 NOTE — Assessment & Plan Note (Signed)
Severe COPD w/ increased sx off maintenance inhalers and supsect gradual decline w/ ongoing smoking  Check cxr today  Restart LAMA /LABA.   Plan  Patient Instructions  Restart ANORO 1 puff daily.  Activity tolerated .  Chest xray  Today .  Work on not smoking .  Restart CPAP At bedtime   Order for new CPAP mask .  Chantix prescription to help with stop smoking .  Follow up with Dr. Vaughan Browner in 3 months and As needed   Please contact office for sooner follow up if symptoms do not improve or worsen or seek emergency care

## 2017-06-14 NOTE — Telephone Encounter (Signed)
Spoke with the pt's daughter and notified of recs per TP  She verbalized understanding  Rxs were sent to [facility]

## 2017-06-16 ENCOUNTER — Telehealth: Payer: Self-pay

## 2017-06-16 NOTE — Telephone Encounter (Signed)
Received PA request from [facility] for Chantix. Per CMM, covered alternatives are Bupropion or Nicotine Polacrilex.  TP please advise if you would like to switch to alternative or start PA. Thanks.

## 2017-06-16 NOTE — Telephone Encounter (Signed)
Per TP: it was discussed with patient at the visit that Chantix is not typically covered, especially with Medicare.  Would recommend nicotine patches OTC.  Thanks.

## 2017-07-02 ENCOUNTER — Emergency Department (HOSPITAL_COMMUNITY): Payer: Medicare HMO

## 2017-07-02 ENCOUNTER — Emergency Department (HOSPITAL_COMMUNITY)
Admission: EM | Admit: 2017-07-02 | Discharge: 2017-07-02 | Disposition: A | Discharge status: Home / Self Care | Payer: Medicare HMO | Attending: Emergency Medicine | Admitting: Emergency Medicine

## 2017-07-02 DIAGNOSIS — I10 Essential (primary) hypertension: Secondary | ICD-10-CM | POA: Insufficient documentation

## 2017-07-02 DIAGNOSIS — J45909 Unspecified asthma, uncomplicated: Secondary | ICD-10-CM | POA: Insufficient documentation

## 2017-07-02 DIAGNOSIS — R0603 Acute respiratory distress: Secondary | ICD-10-CM | POA: Diagnosis not present

## 2017-07-02 DIAGNOSIS — E119 Type 2 diabetes mellitus without complications: Secondary | ICD-10-CM | POA: Insufficient documentation

## 2017-07-02 DIAGNOSIS — R069 Unspecified abnormalities of breathing: Secondary | ICD-10-CM | POA: Diagnosis not present

## 2017-07-02 DIAGNOSIS — R000 Tachycardia, unspecified: Secondary | ICD-10-CM | POA: Diagnosis not present

## 2017-07-02 DIAGNOSIS — J441 Chronic obstructive pulmonary disease with (acute) exacerbation: Secondary | ICD-10-CM | POA: Insufficient documentation

## 2017-07-02 DIAGNOSIS — F17210 Nicotine dependence, cigarettes, uncomplicated: Secondary | ICD-10-CM | POA: Diagnosis not present

## 2017-07-02 DIAGNOSIS — R69 Illness, unspecified: Secondary | ICD-10-CM | POA: Diagnosis not present

## 2017-07-02 DIAGNOSIS — Z79899 Other long term (current) drug therapy: Secondary | ICD-10-CM | POA: Diagnosis not present

## 2017-07-02 DIAGNOSIS — R0600 Dyspnea, unspecified: Secondary | ICD-10-CM | POA: Diagnosis present

## 2017-07-02 LAB — COMPREHENSIVE METABOLIC PANEL
ALBUMIN: 3.5 g/dL (ref 3.5–5.0)
ALT: 19 U/L (ref 17–63)
ANION GAP: 7 (ref 5–15)
AST: 35 U/L (ref 15–41)
Alkaline Phosphatase: 71 U/L (ref 38–126)
BUN: 18 mg/dL (ref 6–20)
CO2: 27 mmol/L (ref 22–32)
Calcium: 8.4 mg/dL — ABNORMAL LOW (ref 8.9–10.3)
Chloride: 105 mmol/L (ref 101–111)
Creatinine, Ser: 0.9 mg/dL (ref 0.61–1.24)
GFR calc Af Amer: >60 mL/min (ref >60)
GLUCOSE: 115 mg/dL — AB (ref 65–99)
POTASSIUM: 5.1 mmol/L (ref 3.5–5.1)
Sodium: 139 mmol/L (ref 135–145)
Total Bilirubin: 1 mg/dL (ref 0.3–1.2)
Total Protein: 6.5 g/dL (ref 6.5–8.1)

## 2017-07-02 LAB — CBC WITH DIFFERENTIAL/PLATELET
BASOS ABS: 0 10*3/uL (ref 0.0–0.1)
BASOS PCT: 0 %
EOS PCT: 0 %
Eosinophils Absolute: 0 10*3/uL (ref 0.0–0.7)
HCT: 42.1 % (ref 39.0–52.0)
Hemoglobin: 13.5 g/dL (ref 13.0–17.0)
Lymphocytes Relative: 54 %
Lymphs Abs: 2.3 10*3/uL (ref 0.7–4.0)
MCH: 29.4 pg (ref 26.0–34.0)
MCHC: 32.1 g/dL (ref 30.0–36.0)
MCV: 91.7 fL (ref 78.0–100.0)
MONO ABS: 0.5 10*3/uL (ref 0.1–1.0)
Monocytes Relative: 11 %
Neutro Abs: 1.5 10*3/uL — ABNORMAL LOW (ref 1.7–7.7)
Neutrophils Relative %: 35 %
PLATELETS: 158 10*3/uL (ref 150–400)
RBC: 4.59 MIL/uL (ref 4.22–5.81)
RDW: 14.5 % (ref 11.5–15.5)
WBC: 4.2 10*3/uL (ref 4.0–10.5)

## 2017-07-02 LAB — I-STAT TROPONIN, ED: Troponin i, poc: 0.02 ng/mL (ref 0.00–0.08)

## 2017-07-02 MED ORDER — SODIUM CHLORIDE 0.9 % IV SOLN
INTRAVENOUS | Status: DC
  Administered 2017-07-02: 12:00:00 via INTRAVENOUS

## 2017-07-02 MED ORDER — ALBUTEROL SULFATE HFA 108 (90 BASE) MCG/ACT IN AERS
2.0000 | INHALATION_SPRAY | Freq: Once | RESPIRATORY_TRACT | Status: AC
  Administered 2017-07-02: 2 via RESPIRATORY_TRACT
  Filled 2017-07-02: qty 6.7

## 2017-07-02 MED ORDER — PREDNISONE 50 MG PO TABS: ORAL_TABLET | ORAL | 0 refills | Status: DC

## 2017-07-02 MED ORDER — ALBUTEROL (5 MG/ML) CONTINUOUS INHALATION SOLN
10.0000 mg/h | INHALATION_SOLUTION | RESPIRATORY_TRACT | Status: AC
  Administered 2017-07-02: 10 mg/h via RESPIRATORY_TRACT
  Filled 2017-07-02: qty 2

## 2017-07-02 NOTE — ED Notes (Signed)
Pt ambulated while on pulse oximetry. Pt's O2 sats remained between 92-94 and HR in the low 110s. Pt had steady gait and stated that "he felt great".

## 2017-07-02 NOTE — ED Provider Notes (Signed)
[facility] EMERGENCY DEPARTMENT Provider Note   CSN: [id number] Arrival date & time: 07/02/17  1029     History   Chief Complaint Chief Complaint  Patient presents with  . Respiratory Distress   HPI   Cory Kemp is a 76 y.o. male with past medical history significant for alcohol abuse, COPD and diabetes brought in by EMS for respiratory distress onset this morning, initially O2 sat in the 60s.  Was given 2 5 mg albuterol nebulizers one with ipratropium, 2 g of mag and 125 mg of Solu-Medrol with EMS, was put on CPAP with a PEEP of 7.5 and he is significantly improved.  Patient has had several episodes of respiratory failure, has never required intubation.  Was in his normal state of health yesterday.  His brother passed away yesterday, he has been compliant with his medications.  He does not have change in his sputum, chest pain or fever, chills, wheezing peripheral edema or history of any cardiac issues.  Pt is not on oxygen at home.  Per EMS patient was originally diaphoretic and with significant accessory muscle use and this has improved, patient also states that he feels much improved after EMS treatment. Pulmonoligist: Mannam   Past Medical History:  Diagnosis Date  . Alcohol abuse   . Anemia   . Arthritis   . Asthma   . B12 deficiency 11/03/2013  . BPH (benign prostatic hypertrophy)   . COPD (chronic obstructive pulmonary disease) ([place])   . Dementia    ?  . Diabetes mellitus ([place]) 01/10/2012  . Femoral hernia, right 03/27/2012  . Glucose intolerance (impaired glucose tolerance)   . Hyperlipidemia   . Hypertension   . Incisional hernias - swiss-cheese-type periumbilical 09/10/7626  . Inguinal hernia - right 01/10/2012  . Leukopenia 01/29/2013  . Monocytosis 01/29/2013  . Normochromic anemia 01/29/2013  . Osteopenia     Patient Active Problem List   Diagnosis Date Noted  . OSA (obstructive sleep apnea) 06/14/2017  . Sinus tachycardia 04/11/2016  .  Essential hypertension 04/11/2016  . COPD (chronic obstructive pulmonary disease) ([place]) 01/11/2016  . B12 deficiency 11/03/2013  . Leukopenia 01/29/2013  . Monocytosis 01/29/2013  . Normochromic anemia 01/29/2013  . Tobacco abuse 03/27/2012  . Diabetes mellitus ([place]) 01/10/2012    Past Surgical History:  Procedure Laterality Date  . HEMORRHOID SURGERY    . HERNIA REPAIR    . INGUINAL HERNIA REPAIR  02/24/2012   Procedure: LAPAROSCOPIC INGUINAL HERNIA;  Surgeon: Adin Hector, MD;  Location: [facility] ORS;  Service: General;  Laterality: N/A;  . TUMOR EXCISION     intestinal after SBO ( Benign), appendectomy  . VENTRAL HERNIA REPAIR  02/24/2012   Procedure: LAPAROSCOPIC VENTRAL HERNIA;  Surgeon: Adin Hector, MD;  Location: [facility];  Service: General;  Laterality: N/A;  lysis of adhesions, excision of abdominal wall lipomae       Home Medications    Prior to Admission medications   Medication Sig Start Date End Date Taking? Authorizing Provider  albuterol (PROAIR HFA) 108 (90 Base) MCG/ACT inhaler Inhale 2 puffs every 4 (four) hours as needed into the lungs for wheezing or shortness of breath. 06/13/17  Yes Parrett, Tammy S, NP  albuterol (PROVENTIL) (2.5 MG/3ML) 0.083% nebulizer solution Take 2.5 mg by nebulization every 6 (six) hours as needed for wheezing or shortness of breath.   Yes [provider]  budesonide-formoterol (SYMBICORT) 160-4.5 MCG/ACT inhaler Inhale 2 puffs into the lungs 2 (two) times  daily.   Yes [provider]  cholecalciferol (VITAMIN D) 1000 UNITS tablet Take 1,000 Units by mouth daily.    Yes [provider]  losartan (COZAAR) 50 MG tablet Take 50 mg by mouth daily with breakfast.  12/26/11  Yes [provider]  Naproxen Sodium (ALEVE) 220 MG CAPS Take 440 mg by mouth daily as needed (for pain). For pain   Yes [provider]  simvastatin (ZOCOR) 40 MG tablet Take 40 mg by mouth at bedtime.  12/27/11  Yes [provider]  umeclidinium-vilanterol (ANORO ELLIPTA) 62.5-25 MCG/INH AEPB Inhale 1 puff daily into the lungs. 06/13/17  Yes Parrett, Tammy S, NP  vitamin B-12 (CYANOCOBALAMIN) 100 MCG tablet Take 100 mcg by mouth daily.   Yes [provider]  predniSONE (DELTASONE) 50 MG tablet Take 1 tablet daily with breakfast 07/02/17   Claudie Brickhouse, Elmyra Ricks, PA-C    Family History Family History  Problem Relation Age of Onset  . Lung cancer Father   . Lung cancer Mother   . Diabetes Brother     Social History Social History   Tobacco Use  . Smoking status: Current Every Day Smoker    Packs/day: 1.00    Types: Cigarettes    Start date: 08/09/1955  . Smokeless tobacco: Never Used  . Tobacco comment: quit smoking 06/08/2016  Substance Use Topics  . Alcohol use: No    Alcohol/week: 0.0 oz  . Drug use: No     Allergies   Adhesive [tape]   Review of Systems Review of Systems  A complete review of systems was obtained and all systems are negative except as noted in the HPI and PMH.    Physical Exam Updated Vital Signs BP 138/81   Pulse (!) 110   Temp 97.8 F (36.6 C) (Oral)   Resp 20   SpO2 96%   Physical Exam  Constitutional: He is oriented to person, place, and time. He appears well-developed and well-nourished. No distress.  HENT:  Head: Normocephalic and atraumatic.  Mouth/Throat: Oropharynx is clear and moist.  Eyes: Conjunctivae and EOM are normal. Pupils are equal, round, and reactive to light.  Neck: Normal range of motion.  Cardiovascular: Normal rate, regular rhythm and intact distal pulses.  Pulmonary/Chest:  Tripoding, speaking in short sentences, no stridor, lung sounds with moderate air movement, inspiratory and expiratory wheezing.  Mild accessory muscle use.  Abdominal: Soft. There is no tenderness.  Musculoskeletal: Normal range of motion.  Neurological: He is alert and oriented to person, place, and time.  Skin: He is not diaphoretic.  Psychiatric: He  has a normal mood and affect.  Nursing note and vitals reviewed.    ED Treatments / Results  Labs (all labs ordered are listed, but only abnormal results are displayed) Labs Reviewed  CBC WITH DIFFERENTIAL/PLATELET - Abnormal; Notable for the following components:      Result Value   Neutro Abs 1.5 (*)    All other components within normal limits  COMPREHENSIVE METABOLIC PANEL - Abnormal; Notable for the following components:   Glucose, Bld 115 (*)    Calcium 8.4 (*)    All other components within normal limits  I-STAT TROPONIN, ED    EKG  EKG Interpretation  Date/Time:  Sunday July 02 2017 10:33:08 EST Ventricular Rate:  131 PR Interval:    QRS Duration: 95 QT Interval:  319 QTC Calculation: 471 R Axis:   95 Text Interpretation:  Sinus tachycardia Ventricular premature complex Anterior infarct, old similar  to previous EKG 04/10/2016 Confirmed by Brantley Stage [phone]) on 07/02/2017 11:41:04 AM       Radiology Dg Chest Portable 1 View  Result Date: 07/02/2017 CLINICAL DATA:  Respiratory distress. EXAM: PORTABLE CHEST 1 VIEW COMPARISON:  Radiographs of June 13, 2017. FINDINGS: Stable cardiomediastinal silhouette. Atherosclerosis of thoracic aorta is noted. No pneumothorax or pleural effusion is noted. Stable emphysematous disease is noted in the upper lobes bilaterally. No consolidative process is noted. Bony thorax is unremarkable. IMPRESSION: Stable emphysematous disease. Aortic atherosclerosis. No acute cardiopulmonary abnormality seen. Electronically Signed   By: Marijo Conception, M.D.   On: 07/02/2017 10:58    Procedures Procedures (including critical care time)  Medications Ordered in ED Medications  0.9 %  sodium chloride infusion ( Intravenous Stopped 07/02/17 1331)  albuterol (PROVENTIL,VENTOLIN) solution continuous neb (0 mg/hr Nebulization Stopped 07/02/17 1303)  albuterol (PROVENTIL HFA;VENTOLIN HFA) 108 (90 Base) MCG/ACT inhaler 2 puff (2 puffs Inhalation  Given 07/02/17 1412)     Initial Impression / Assessment and Plan / ED Course  I have reviewed the triage vital signs and the nursing notes.  Pertinent labs & imaging results that were available during my care of the patient were reviewed by me and considered in my medical decision making (see chart for details).     Vitals:   07/02/17 1315 07/02/17 1330 07/02/17 1345 07/02/17 1400  BP: 140/77 (!) 145/69 115/65 138/81  Pulse: (!) 111 (!) 113  (!) 110  Resp: 14 (!) 25  20  Temp:      TempSrc:      SpO2: 100% 94% (!) 89% 96%    Medications  0.9 %  sodium chloride infusion ( Intravenous Stopped 07/02/17 1331)  albuterol (PROVENTIL,VENTOLIN) solution continuous neb (0 mg/hr Nebulization Stopped 07/02/17 1303)  albuterol (PROVENTIL HFA;VENTOLIN HFA) 108 (90 Base) MCG/ACT inhaler 2 puff (2 puffs Inhalation Given 07/02/17 1412)    Cory Kemp is 76 y.o. male presenting with shortness of breath, wheezing consistent with prior COPD exacerbations.  As per EMS sats were in the low 60s, he has been given 2 albuterol nebulizers with ipratropium in the last 1, Solu-Medrol and magnesium.  No associated chest pain, increasing peripheral edema, fever chills or change in sputum.  Patient significantly improved with EMS treatment.  Doubt ACS, CHF, PE.   Chest x-ray is without infiltrate, blood work reassuring, EKG nonischemic, troponin negative.  Patient given hour-long nebulizer in the ED and states that he feels significantly better, there is much improved air movement and decrease in wheezing.  When he ambulates he is able to maintain his oxygen saturation in the 90s.  We have recommended admission however patient declines he states that he lives very close to the emergency department, he checks his oxygen levels hourly.  He states that he knows what to look for and will not hesitate to return to the ED or call 911 if shortness of breath recurs.  Patient's brother passed away yesterday and he needs to  arrange the funeral.  Patient notes that he was given a Symbicort inhaler by a friend, he is also taking a Anoro the a.m. he feels much improved after taking the Anoro AM and Symbicort in the p.m.  I discussed this with the ED pharmacist who states that this is generally not recommended, he is getting double dose of the long-acting beta agonist.  I have explained to this patient that this is generally not how we would prescribe these medication and have recommended that  he only take the medications that is prescribed to him, I have advised him that he should follow closely with his pulmonologist and advised him that he is having difficulty on his current medications and we have had an extensive discussion of return precautions and he understands he can call 911 and return to the ED at any time.  I have again offered this patient admission and he has declined.  Patient is of sound mind, not clinically intoxicated, cannot meaningfully discussed the pros and cons of discharge and has capacity for medical decision making.  He understands that leaving the emergency department may result in his death or disability and he assumes that risk.   Final Clinical Impressions(s) / ED Diagnoses   Final diagnoses:  COPD exacerbation [facility])    ED Discharge Orders        Ordered    predniSONE (DELTASONE) 50 MG tablet     07/02/17 1425       Katrinka Herbison, [place], [place] 07/02/17 1433    Forde Dandy, MD 07/03/17 [phone]

## 2017-07-02 NOTE — ED Triage Notes (Signed)
Per EMS- pt has hx of COPD and resp distress. Has periodic episodes of shortness of breath that go away. Pt was originally 61% on room air. Pt was given 10 albuterol, .5 atrovent, 125 solumedrol, 2 of mag. Rhythm tachy w PVCs

## 2017-07-02 NOTE — ED Provider Notes (Addendum)
Medical screening examination/treatment/procedure(s) were conducted as a shared visit with non-physician practitioner(s) and myself.  I personally evaluated the patient during the encounter.   EKG Interpretation None      76 year old male who presents with shortness of breath.  He states he has a history of COPD, and is not on chronic oxygen.  States shortness of breath that started this morning that gradually worsened.  EMS was called.  He initially was 61% on room air.  Received 10 mg of albuterol, 0.5 mg of Atrovent, 25 mg of Solu-Medrol, and 2 g of magnesium sulfate.  He was placed on CPAP and brought to ED for evaluation.  In route patient did have significant improvement in his breathing.  He states that he feels significantly better.  Still has poor air movement with wheezing although patient speaking in full sentences and without significant accessory muscle usage.  CXR without edema or infiltrate. EKg non-ischemic. Trop negative. Low suspicion for atypical ACS.   Patient is able to be taken off of BiPAP.  He received continuous albuterol.  Work of breathing significantly improved and mood moving better air.  Discussed potential admission given the acuity of his condition when he first arrived, but he states that he feels significantly improved and would like to be discharged home.  He has ambulated in the ED with normal pulse ox and no further shortness of breath.  Has been tachycardic but after receiving a total of 20 mg of albuterol in ED and by EMS.     Discussed potential for worsening respiratory status. He is very reliable and reasonable patient. He states he lives close to hospital and would return for any worsening symptoms, and would not want to be admitted. He expressed that he understands that he could get worse and will return to ED. Strict return and follow-up instructions reviewed. He expressed understanding of all discharge instructions and felt comfortable with the plan of  care.  Discharged with course of steroids.    Forde Dandy, MD 07/02/17 1528    Forde Dandy, MD 07/02/17 [phone]

## 2017-07-02 NOTE — Discharge Instructions (Signed)
Please follow with your primary care doctor in the next 2 days for a check-up. They must obtain records for further management.  ° °Do not hesitate to return to the Emergency Department for any new, worsening or concerning symptoms.  ° °

## 2017-07-21 ENCOUNTER — Telehealth: Payer: Self-pay | Admitting: Adult Health

## 2017-07-21 MED ORDER — ALBUTEROL SULFATE HFA 108 (90 BASE) MCG/ACT IN AERS: 2.0000 | INHALATION_SPRAY | RESPIRATORY_TRACT | 3 refills | Status: DC | PRN

## 2017-07-21 NOTE — Telephone Encounter (Signed)
Received faxed refill request for patient's Proventil with note stating that this drug is not covered pt patient's plan - preferred alternative is Ventolin HFA.  Last ov 11.6.18 w/ TP Rx changed and refilled

## 2017-09-12 ENCOUNTER — Ambulatory Visit: Payer: Medicare HMO | Admitting: Adult Health

## 2017-09-13 ENCOUNTER — Ambulatory Visit: Payer: Medicare HMO | Admitting: Adult Health

## 2017-09-25 DIAGNOSIS — E7800 Pure hypercholesterolemia, unspecified: Secondary | ICD-10-CM | POA: Diagnosis not present

## 2017-09-25 DIAGNOSIS — R69 Illness, unspecified: Secondary | ICD-10-CM | POA: Diagnosis not present

## 2017-09-25 DIAGNOSIS — N390 Urinary tract infection, site not specified: Secondary | ICD-10-CM | POA: Diagnosis not present

## 2017-09-25 DIAGNOSIS — Z0000 Encounter for general adult medical examination without abnormal findings: Secondary | ICD-10-CM | POA: Diagnosis not present

## 2017-09-25 DIAGNOSIS — Z125 Encounter for screening for malignant neoplasm of prostate: Secondary | ICD-10-CM | POA: Diagnosis not present

## 2017-09-27 DIAGNOSIS — Z0000 Encounter for general adult medical examination without abnormal findings: Secondary | ICD-10-CM | POA: Diagnosis not present

## 2017-09-27 DIAGNOSIS — J449 Chronic obstructive pulmonary disease, unspecified: Secondary | ICD-10-CM | POA: Diagnosis not present

## 2017-09-28 ENCOUNTER — Other Ambulatory Visit: Payer: Self-pay | Admitting: Adult Health

## 2017-10-03 ENCOUNTER — Telehealth: Payer: Self-pay

## 2017-10-03 NOTE — Telephone Encounter (Signed)
Lm requesting that pt bring SD card to apt on 2/27. Nothing further is needed.

## 2017-10-04 ENCOUNTER — Encounter: Payer: Self-pay | Admitting: Pulmonary Disease

## 2017-10-04 ENCOUNTER — Ambulatory Visit: Payer: Medicare HMO | Admitting: Pulmonary Disease

## 2017-10-04 VITALS — BP 148/80 | HR 97 | Ht 73.0 in | Wt 195.0 lb

## 2017-10-04 DIAGNOSIS — Z720 Tobacco use: Secondary | ICD-10-CM | POA: Diagnosis not present

## 2017-10-04 DIAGNOSIS — G4733 Obstructive sleep apnea (adult) (pediatric): Secondary | ICD-10-CM | POA: Diagnosis not present

## 2017-10-04 DIAGNOSIS — J441 Chronic obstructive pulmonary disease with (acute) exacerbation: Secondary | ICD-10-CM

## 2017-10-04 NOTE — Patient Instructions (Signed)
We will get you fitted with an appropriate mask for CPAP We will give you samples of trelegy.  Use this instead of the anoro for a few weeks and let us know if this works better Continue your albuterol and nebulizer Follow-up in 6 months.

## 2017-10-04 NOTE — Progress Notes (Signed)
Cory Kemp    [id number]    1941/03/13  Primary Care Physician:Kim, Jeneen Rinks, MD  Referring Physician: Jani Gravel, MD [address], [place] [address]  Chief complaint:   Follow up for COPD GOLD C (FEV1 41%, CAT score 8) Asthmatic bronchitis Active smoker  HPI: Cory Kemp is a 77 year old chief complaint of dyspnea on exertion for the past 2 years. This is associated with cough, white mucus production, chest congestion, wheezing. He is a current 1 pack per day smoker. He is on albuterol which helps with her symptoms. He had been tried on various inhalers including Advair, Breo and Spiriva which did not help.  Interim history: Continues on the anoro inhaler.  Reports that breathing is worsened since last office visit.  Complains of increased dyspnea, cough with clear mucus, nasal drainage. He is noncompliant with his CPAP as this issues with mask fit.  Outpatient Encounter Medications as of 10/04/2017  Medication Sig  . albuterol (PROVENTIL HFA;VENTOLIN HFA) 108 (90 Base) MCG/ACT inhaler Inhale 2 puffs into the lungs every 4 (four) hours as needed for wheezing or shortness of breath.  [name] Kitchen albuterol (PROVENTIL) (2.5 MG/3ML) 0.083% nebulizer solution Take 2.5 mg by nebulization every 6 (six) hours as needed for wheezing or shortness of breath.  . budesonide-formoterol (SYMBICORT) 160-4.5 MCG/ACT inhaler Inhale 2 puffs into the lungs 2 (two) times daily.  . cholecalciferol (VITAMIN D) 1000 UNITS tablet Take 1,000 Units by mouth daily.   [name] Kitchen losartan (COZAAR) 50 MG tablet Take 50 mg by mouth daily with breakfast.   . Naproxen Sodium (ALEVE) 220 MG CAPS Take 440 mg by mouth daily as needed (for pain). For pain  . simvastatin (ZOCOR) 40 MG tablet Take 40 mg by mouth at bedtime.   [name] Kitchen umeclidinium-vilanterol (ANORO ELLIPTA) 62.5-25 MCG/INH AEPB Inhale 1 puff daily into the lungs.  . VENTOLIN HFA 108 (90 Base) MCG/ACT inhaler INHALE 2 PUFFS INTO THE LUNGS EVERY 4  HOURS FOR WHEEZING OR SHORTNESS OF BREATH  . vitamin B-12 (CYANOCOBALAMIN) 100 MCG tablet Take 100 mcg by mouth daily.  . [DISCONTINUED] predniSONE (DELTASONE) 50 MG tablet Take 1 tablet daily with breakfast   No facility-administered encounter medications on file as of 10/04/2017.     Allergies as of 10/04/2017 - Review Complete 10/04/2017  Allergen Reaction Noted  . Adhesive [tape] Rash 02/26/2012    Past Medical History:  Diagnosis Date  . Alcohol abuse   . Anemia   . Arthritis   . Asthma   . B12 deficiency 11/03/2013  . BPH (benign prostatic hypertrophy)   . COPD (chronic obstructive pulmonary disease) ([place])   . Dementia    ?  . Diabetes mellitus ([place]) 01/10/2012  . Femoral hernia, right 03/27/2012  . Glucose intolerance (impaired glucose tolerance)   . Hyperlipidemia   . Hypertension   . Incisional hernias - swiss-cheese-type periumbilical 09/14/621  . Inguinal hernia - right 01/10/2012  . Leukopenia 01/29/2013  . Monocytosis 01/29/2013  . Normochromic anemia 01/29/2013  . Osteopenia     Past Surgical History:  Procedure Laterality Date  . HEMORRHOID SURGERY    . HERNIA REPAIR    . INGUINAL HERNIA REPAIR  02/24/2012   Procedure: LAPAROSCOPIC INGUINAL HERNIA;  Surgeon: Adin Hector, MD;  Location: [facility];  Service: General;  Laterality: N/A;  . TUMOR EXCISION     intestinal after SBO ( Benign), appendectomy  . VENTRAL HERNIA REPAIR  02/24/2012   Procedure:  LAPAROSCOPIC VENTRAL HERNIA;  Surgeon: Adin Hector, MD;  Location: [facility];  Service: General;  Laterality: N/A;  lysis of adhesions, excision of abdominal wall lipomae    Family History  Problem Relation Age of Onset  . Lung cancer Father   . Lung cancer Mother   . Diabetes Brother     Social History   Socioeconomic History  . Marital status: Single    Spouse name: Not on file  . Number of children: 2  . Years of education: Not on file  . Highest education level: Not on file  Social Needs  . Financial  resource strain: Not on file  . Food insecurity - worry: Not on file  . Food insecurity - inability: Not on file  . Transportation needs - medical: Not on file  . Transportation needs - non-medical: Not on file  Occupational History  . Occupation: Retired    Comment: [employer]  Tobacco Use  . Smoking status: Current Every Day Smoker    Packs/day: 0.50    Types: Cigarettes    Start date: 08/09/1955  . Smokeless tobacco: Never Used  Substance and Sexual Activity  . Alcohol use: No    Alcohol/week: 0.0 oz  . Drug use: No  . Sexual activity: Not on file  Other Topics Concern  . Not on file  Social History Narrative  . Not on file   Review of systems: Review of Systems  Constitutional: Negative for fever and chills.  HENT: Negative.   Eyes: Negative for blurred vision.  Respiratory: as per HPI  Cardiovascular: Negative for chest pain and palpitations.  Gastrointestinal: Negative for vomiting, diarrhea, blood per rectum. Genitourinary: Negative for dysuria, urgency, frequency and hematuria.  Musculoskeletal: Negative for myalgias, back pain and joint pain.  Skin: Negative for itching and rash.  Neurological: Negative for dizziness, tremors, focal weakness, seizures and loss of consciousness.  Endo/Heme/Allergies: Negative for environmental allergies.  Psychiatric/Behavioral: Negative for depression, suicidal ideas and hallucinations.  All other systems reviewed and are negative.   Physical Exam: Blood pressure (!) 148/80, pulse 97, height 6\' 1"  (1.854 m), weight 195 lb (88.5 kg), SpO2 98 %. Gen:      No acute distress HEENT:  EOMI, sclera anicteric Neck:     No masses; no thyromegaly Lungs:    Clear to auscultation bilaterally; normal respiratory effort CV:         Regular rate and rhythm; no murmurs Abd:      + bowel sounds; soft, non-tender; no palpable masses, no distension Ext:    No edema; adequate peripheral perfusion Skin:      Warm and dry; no rash Neuro: alert and  oriented x 3 Psych: normal mood and affect  Data Reviewed: CT chest 09/12/14- Emphysematous changes noted bilaterally. Minimal posterior basilar scarring or subsegmental atelectasis is noted. No pulmonary mass or nodule is noted CT angiogram 04/10/16- emphysema, chronic bronchitis.  Subsegmental atelectasis at the bases mildly increased compared to previous Chest x-ray 07/02/17-hyperinflated lungs.  No acute abnormality. I have reviewed the images personally.  PFTs 01/21/16 FVC 2.44 (71%), FEV1 1.47 [41%), F/F 43, TLC 122%, RV/TLC 166%, DLCO 48% Severe obstruction with reduction in diffusion capacity. Hyperinflation with air trapping.  Home sleep test 11/14/16 Moderate OSA with desats.  Assessment:  COPD  His chief complaint is dyspnea on exertion and he had not tolerated many of the inhalers in the past. Cannot afford the brovana, Pulmicort. Although they do not meet the formal criteria he does  have a big improvement in FEV1 post bronchodilator suggestive of an asthmatic component. He may benefit from inhaled corticosteroids as part of his inhaler regimen.  His meds has not been somewhat disorganized as he could not afford many of the inahlers.  We will try to switch him to trelegy inhaler and give him samples.  If this works for him better we will work on getting patient assistance.  Active smoker Continues to smoke.  He has quit intermittently in the past.  I encouraged him to stop altogether.  Moderate OSA Not using CPAP due to broken mask.  Will try to get him fitted with a new mask.  Plan/Recommendations: - Encouraged to keep of cigarettes - Start trelegy - New CPAP mask  Return in 6 months.   [name] MD [facility] Pager [phone] 10/04/2017, 9:48 AM  CC: Jani Gravel, MD

## 2017-10-13 ENCOUNTER — Other Ambulatory Visit: Payer: Self-pay | Admitting: Adult Health

## 2017-10-23 ENCOUNTER — Other Ambulatory Visit (HOSPITAL_BASED_OUTPATIENT_CLINIC_OR_DEPARTMENT_OTHER): Payer: Medicare HMO

## 2017-11-14 ENCOUNTER — Encounter: Payer: Self-pay | Admitting: Pulmonary Disease

## 2017-11-14 ENCOUNTER — Ambulatory Visit (INDEPENDENT_AMBULATORY_CARE_PROVIDER_SITE_OTHER): Payer: Medicare HMO | Admitting: Pulmonary Disease

## 2017-11-14 VITALS — BP 130/78 | HR 93 | Ht 73.0 in | Wt 194.6 lb

## 2017-11-14 DIAGNOSIS — J441 Chronic obstructive pulmonary disease with (acute) exacerbation: Secondary | ICD-10-CM | POA: Diagnosis not present

## 2017-11-14 DIAGNOSIS — G4733 Obstructive sleep apnea (adult) (pediatric): Secondary | ICD-10-CM

## 2017-11-14 MED ORDER — PREDNISONE 10 MG PO TABS: ORAL_TABLET | ORAL | 0 refills | Status: DC

## 2017-11-14 MED ORDER — FLUTICASONE-UMECLIDIN-VILANT 100-62.5-25 MCG/INH IN AEPB: 1.0000 | INHALATION_SPRAY | Freq: Every day | RESPIRATORY_TRACT | 6 refills | Status: DC

## 2017-11-14 NOTE — Addendum Note (Signed)
Addended by: Vivia Ewing on: 11/14/2017 04:41 PM   Modules accepted: Orders

## 2017-11-14 NOTE — Progress Notes (Signed)
Cory Kemp    [id number]    01/10/1941  Primary Care Physician:Kim, Cory Rinks, MD  Referring Physician: Jani Gravel, MD [address] [place], [place] [address]  Chief complaint:   Follow up for COPD GOLD C (FEV1 41%, CAT score 8) Asthmatic bronchitis Active smoker  HPI: [name] is a 77 year old chief complaint of dyspnea on exertion for the past 2 years. This is associated with cough, white mucus production, chest congestion, wheezing. He is a current 1 pack per day smoker. He is on albuterol which helps with her symptoms. He had been tried on various inhalers including Advair, Breo and Spiriva which did not help.  Interim history: Continues on the anoro inhaler.  Reports that breathing is worsened since last office visit.  Complains of increased dyspnea, cough with clear mucus, nasal drainage. He is noncompliant with his CPAP as this issues with mask fit.  Outpatient Encounter Medications as of 11/14/2017  Medication Sig  . albuterol (PROVENTIL HFA;VENTOLIN HFA) 108 (90 Base) MCG/ACT inhaler Inhale 2 puffs into the lungs every 4 (four) hours as needed for wheezing or shortness of breath.  [name] Kitchen albuterol (PROVENTIL) (2.5 MG/3ML) 0.083% nebulizer solution Take 2.5 mg by nebulization every 6 (six) hours as needed for wheezing or shortness of breath.  Cory Kemp ELLIPTA 62.5-25 MCG/INH AEPB INHALE 1 PUFF BY MOUTH INTO THE LUNGS DAILY  . budesonide-formoterol (SYMBICORT) 160-4.5 MCG/ACT inhaler Inhale 2 puffs into the lungs 2 (two) times daily.  . cholecalciferol (VITAMIN D) 1000 UNITS tablet Take 1,000 Units by mouth daily.   [name] Kitchen losartan (COZAAR) 50 MG tablet Take 50 mg by mouth daily with breakfast.   . Naproxen Sodium (ALEVE) 220 MG CAPS Take 440 mg by mouth daily as needed (for pain). For pain  . simvastatin (ZOCOR) 40 MG tablet Take 40 mg by mouth at bedtime.   . VENTOLIN HFA 108 (90 Base) MCG/ACT inhaler INHALE 2 PUFFS INTO THE LUNGS EVERY 4 HOURS FOR WHEEZING OR  SHORTNESS OF BREATH  . vitamin B-12 (CYANOCOBALAMIN) 100 MCG tablet Take 100 mcg by mouth daily.   No facility-administered encounter medications on file as of 11/14/2017.     Allergies as of 11/14/2017 - Review Complete 11/14/2017  Allergen Reaction Noted  . Adhesive [tape] Rash 02/26/2012    Past Medical History:  Diagnosis Date  . Alcohol abuse   . Anemia   . Arthritis   . Asthma   . B12 deficiency 11/03/2013  . BPH (benign prostatic hypertrophy)   . COPD (chronic obstructive pulmonary disease) ([place])   . Dementia    ?  . Diabetes mellitus ([place]) 01/10/2012  . Femoral hernia, right 03/27/2012  . Glucose intolerance (impaired glucose tolerance)   . Hyperlipidemia   . Hypertension   . Incisional hernias - swiss-cheese-type periumbilical 09/14/621  . Inguinal hernia - right 01/10/2012  . Leukopenia 01/29/2013  . Monocytosis 01/29/2013  . Normochromic anemia 01/29/2013  . Osteopenia     Past Surgical History:  Procedure Laterality Date  . HEMORRHOID SURGERY    . HERNIA REPAIR    . INGUINAL HERNIA REPAIR  02/24/2012   Procedure: LAPAROSCOPIC INGUINAL HERNIA;  Surgeon: Cory Hector, MD;  Location: [facility];  Service: General;  Laterality: N/A;  . TUMOR EXCISION     intestinal after SBO ( Benign), appendectomy  . VENTRAL HERNIA REPAIR  02/24/2012   Procedure: LAPAROSCOPIC VENTRAL HERNIA;  Surgeon: Cory Hector, MD;  Location: [facility];  Service: General;  Laterality: N/A;  lysis of adhesions, excision of abdominal wall lipomae    Family History  Problem Relation Age of Onset  . Lung cancer Father   . Lung cancer Mother   . Diabetes Brother     Social History   Socioeconomic History  . Marital status: Single    Spouse name: Not on file  . Number of children: 2  . Years of education: Not on file  . Highest education level: Not on file  Occupational History  . Occupation: Retired    Comment: International aid/development worker  . Financial resource strain: Not on file  . Food  insecurity:    Worry: Not on file    Inability: Not on file  . Transportation needs:    Medical: Not on file    Non-medical: Not on file  Tobacco Use  . Smoking status: Current Every Day Smoker    Packs/day: 0.50    Types: Cigarettes    Start date: 08/09/1955  . Smokeless tobacco: Never Used  Substance and Sexual Activity  . Alcohol use: No    Alcohol/week: 0.0 oz  . Drug use: No  . Sexual activity: Not on file  Lifestyle  . Physical activity:    Days per week: Not on file    Minutes per session: Not on file  . Stress: Not on file  Relationships  . Social connections:    Talks on phone: Not on file    Gets together: Not on file    Attends religious service: Not on file    Active member of club or organization: Not on file    Attends meetings of clubs or organizations: Not on file    Relationship status: Not on file  . Intimate partner violence:    Fear of current or ex partner: Not on file    Emotionally abused: Not on file    Physically abused: Not on file    Forced sexual activity: Not on file  Other Topics Concern  . Not on file  Social History Narrative  . Not on file   Review of systems: Review of Systems  Constitutional: Negative for fever and chills.  HENT: Negative.   Eyes: Negative for blurred vision.  Respiratory: as per HPI  Cardiovascular: Negative for chest pain and palpitations.  Gastrointestinal: Negative for vomiting, diarrhea, blood per rectum. Genitourinary: Negative for dysuria, urgency, frequency and hematuria.  Musculoskeletal: Negative for myalgias, back pain and joint pain.  Skin: Negative for itching and rash.  Neurological: Negative for dizziness, tremors, focal weakness, seizures and loss of consciousness.  Endo/Heme/Allergies: Negative for environmental allergies.  Psychiatric/Behavioral: Negative for depression, suicidal ideas and hallucinations.  All other systems reviewed and are negative.   Physical Exam: Blood pressure 130/78,  pulse 93, height 6\' 1"  (1.854 m), weight 194 lb 9.6 oz (88.3 kg), SpO2 92 %. Gen:      No acute distress HEENT:  EOMI, sclera anicteric Neck:     No masses; no thyromegaly Lungs:    Scattered exp wheeze CV:         Regular rate and rhythm; no murmurs Abd:      + bowel sounds; soft, non-tender; no palpable masses, no distension Ext:    No edema; adequate peripheral perfusion Skin:      Warm and dry; no rash Neuro: alert and oriented x 3 Psych: normal mood and affect  Data Reviewed: CT chest 09/12/14- Emphysematous changes noted bilaterally. Minimal posterior  basilar scarring or subsegmental atelectasis is noted. No pulmonary mass or nodule is noted CT angiogram 04/10/16- emphysema, chronic bronchitis.  Subsegmental atelectasis at the bases mildly increased compared to previous Chest x-ray 07/02/17-hyperinflated lungs.  No acute abnormality. I have reviewed the images personally.  PFTs 01/21/16 FVC 2.44 (71%), FEV1 1.47 [41%), F/F 43, TLC 122%, RV/TLC 166%, DLCO 48% Severe obstruction with reduction in diffusion capacity. Hyperinflation with air trapping.  Home sleep test 11/14/16 Moderate OSA with desats.  Assessment:  COPD  His chief complaint is dyspnea on exertion and he had not tolerated many of the inhalers in the past. Cannot afford the brovana, Pulmicort. Although they do not meet the formal criteria he does have a big improvement in FEV1 post bronchodilator suggestive of an asthmatic component. He may benefit from inhaled corticosteroids as part of his inhaler regimen.  Start trelegy inhaler.  He appears to be in the middle of a minor exacerbation Give prednisone taper.  Active smoker Quit smoking 5 days ago.  I congratulated him and encouraged him to keep off cigarettes.  Moderate OSA Not using CPAP due to broken mask.  He will need order placed to resume CPAP.  Plan/Recommendations: - Encouraged to keep of cigarettes - Start trelegy, prednisone taper starting at 40 mg.   Reduce dose by 10 mg every 3 days - Reorder CPAP  Return in 3 months  [name] MD [facility] Pager [phone] 11/14/2017, 4:18 PM  CC: Cory Gravel, MD

## 2017-11-14 NOTE — Patient Instructions (Signed)
Start trelegy.  We will give a prescription for the same Prednisone taper starting at 40 mg.  Reduce dose by 10 mg every 3 days We will put in the order for CPAP again to the DME company Congratulations on quitting smoking.  Make sure that you do not start to have it again Follow-up in 3 months.

## 2017-11-16 ENCOUNTER — Telehealth: Payer: Self-pay

## 2017-11-16 DIAGNOSIS — G4733 Obstructive sleep apnea (adult) (pediatric): Secondary | ICD-10-CM

## 2017-11-16 NOTE — Telephone Encounter (Signed)
Received below staff message from [place] with [facility]. Per Dr. Vaughan Browner- order spilt night.  Called and spoke to pt to relay below message. Pt requested that I call his daughter, [name] and relay this information. lmtcb x1 for pt's daughter, [name].  From: Loreen Freud  Sent: 11/16/2017  8:11 AM  To: Vivia Ewing, LPN  Subject: CPAP                       This pt's cpap has been picked up due to noncompliance. If Dr Vaughan Browner wants the pt to get it back the pt will have to have a new sleep study and start over again.

## 2017-11-20 NOTE — Telephone Encounter (Signed)
lmtcb x 2 for pt's daughter, Cory Kemp

## 2017-11-21 DIAGNOSIS — Z23 Encounter for immunization: Secondary | ICD-10-CM | POA: Diagnosis not present

## 2017-11-21 NOTE — Telephone Encounter (Signed)
Left message for [name] to call back x3

## 2017-11-21 NOTE — Telephone Encounter (Signed)
Spoke with [employer]. Explained to what a split night study consists of. She stated that the patient can not sleep on his back and prefers to sleep in a chair. Advised patient that I could place that in the order who accommodations could be made. She verbalized understanding.   She wishes to proceed with the split night study. She will call and her father know. Order has been placed.   Nothing else needed at time of call.

## 2017-12-05 ENCOUNTER — Other Ambulatory Visit: Payer: Self-pay | Admitting: Adult Health

## 2017-12-10 ENCOUNTER — Other Ambulatory Visit: Payer: Self-pay | Admitting: Adult Health

## 2017-12-16 ENCOUNTER — Ambulatory Visit (HOSPITAL_BASED_OUTPATIENT_CLINIC_OR_DEPARTMENT_OTHER): Payer: Medicare HMO | Attending: Pulmonary Disease | Admitting: Pulmonary Disease

## 2017-12-16 VITALS — Ht 73.0 in | Wt 190.0 lb

## 2017-12-16 DIAGNOSIS — I10 Essential (primary) hypertension: Secondary | ICD-10-CM | POA: Diagnosis not present

## 2017-12-16 DIAGNOSIS — Z79899 Other long term (current) drug therapy: Secondary | ICD-10-CM | POA: Diagnosis not present

## 2017-12-16 DIAGNOSIS — G4733 Obstructive sleep apnea (adult) (pediatric): Secondary | ICD-10-CM

## 2017-12-16 DIAGNOSIS — J449 Chronic obstructive pulmonary disease, unspecified: Secondary | ICD-10-CM | POA: Insufficient documentation

## 2017-12-16 DIAGNOSIS — G4736 Sleep related hypoventilation in conditions classified elsewhere: Secondary | ICD-10-CM | POA: Insufficient documentation

## 2017-12-20 DIAGNOSIS — G4733 Obstructive sleep apnea (adult) (pediatric): Secondary | ICD-10-CM | POA: Diagnosis not present

## 2017-12-20 NOTE — Procedures (Signed)
    Patient Name: Cory Kemp, Cory Kemp Date: 12/16/2017   Gender: Male  D.O.B: April 22, 1941  Age (years): 64  Referring Provider: [name]  Height (inches): 28  Interpreting Physician: Chesley Mires MD, ABSM  Weight (lbs): 190  RPSGT: [place], [place]  BMI: 25  MRN: [id number]  Neck Size: 16.00  CLINICAL INFORMATION  Sleep Study Type: NPSG Indication for sleep study: COPD, Hypertension. Epworth Sleepiness Score: 4 SLEEP STUDY TECHNIQUE  As per the AASM Manual for the Scoring of Sleep and Associated Events v2.3 (April 2016) with a hypopnea requiring 4% desaturations. The channels recorded and monitored were frontal, central and occipital EEG, electrooculogram (EOG), submentalis EMG (chin), nasal and oral airflow, thoracic and abdominal wall motion, anterior tibialis EMG, snore microphone, electrocardiogram, and pulse oximetry. MEDICATIONS  Medications self-administered by patient taken the night of the study : Melatonin, Advil PM, ALBUTEROL, Albuterol via nebulizer, Cholecalciferol, SIMVASTATIN, LOSARTAN SLEEP ARCHITECTURE  The study was initiated at 9:50:50 PM and ended at 4:33:21 AM. Sleep onset time was 42.8 minutes and the sleep efficiency was 54.8%%. The total sleep time was 220.5 minutes. Stage REM latency was 47.0 minutes. The patient spent 16.6%% of the night in stage N1 sleep, 56.5%% in stage N2 sleep, 1.4%% in stage N3 and 25.62% in REM. Alpha intrusion was absent. Supine sleep was 0.00%. He slept in a recliner with lights on during the study. RESPIRATORY PARAMETERS  The overall apnea/hypopnea index (AHI) was 0.8 per hour. There were 3 total apneas, including 3 obstructive, 0 central and 0 mixed apneas. There were 0 hypopneas and 10 RERAs. The AHI during Stage REM sleep was 2.1 per hour. AHI while supine was N/A per hour. The mean oxygen saturation was 92.6%. The minimum SpO2 during sleep was 80.0%. He spent 18.1 minutes of test time with SpO2 < 88%. He was placed on  1 liter supplemental oxygen with improvement in SpO2. Loud snoring was noted during this study. CARDIAC DATA  The 2 lead EKG demonstrated sinus rhythm. The mean heart rate was 73.2 beats per minute. Other EKG findings include: PVCs. LEG MOVEMENT DATA  The total PLMS were 0 with a resulting PLMS index of 0.0. Associated arousal with leg movement index was 0.0 . IMPRESSIONS  - No significant obstructive sleep apnea occurred during this study (AHI = 0.8/h). - No significant central sleep apnea occurred during this study (CAI = 0.0/h).  - Moderate oxygen desaturation was noted during this study (Min O2 = 80.0%). He spent 18.1 minutes of test time with an SpO2 < 88%, and this improved after the addition of 1 liter supplemental oxgyen. - Of note is that he slept in a recliner during this study. DIAGNOSIS  - Nocturnal Hypoxemia (327.26 [G47.36 ICD-10]) RECOMMENDATIONS  - He should be tried on 1 liter supplemental oxygen at night. [Electronically signed] 12/20/2017 12:06 PM Chesley Mires MD, [place], American Board of Sleep Medicine  NPI: [id number]

## 2018-01-02 ENCOUNTER — Telehealth: Payer: Self-pay | Admitting: Pulmonary Disease

## 2018-01-02 DIAGNOSIS — R0600 Dyspnea, unspecified: Secondary | ICD-10-CM

## 2018-01-02 NOTE — Telephone Encounter (Signed)
Called patient's daughter back and relayed information about sleep study per Dr. Vaughan Browner.  Patient will need oxygen overnight so placed order. Patient's daughter requested [facility] in [facility].  Scheduled patient to come for oxygen qualification for POC during the day for 5-29 at 0900. Nothing further needed at this time.

## 2018-01-02 NOTE — Telephone Encounter (Signed)
Called and spoke to patient's daughter, on [place]. She stated that she would like to know what the results of the sleep study show.  Dr. Vaughan Browner please advise.  She also thinks her father needs to be on oxygen now. Can I schedule for qualifying walk or do you want an OV to assess decline in patient?

## 2018-01-02 NOTE — Telephone Encounter (Signed)
Sleep study does not show sleep apnea but there is reduction in oxygen at night. He will need to be on 1 LPM O2 at night  Ok to schedule a qualifying walk to determine O2 need during day time. No need for office visit.

## 2018-01-03 ENCOUNTER — Ambulatory Visit (INDEPENDENT_AMBULATORY_CARE_PROVIDER_SITE_OTHER)
Admission: RE | Admit: 2018-01-03 | Discharge: 2018-01-03 | Disposition: A | Discharge status: Home / Self Care | Payer: Medicare HMO | Source: Ambulatory Visit | Attending: Pulmonary Disease | Admitting: Pulmonary Disease

## 2018-01-03 ENCOUNTER — Encounter: Payer: Self-pay | Admitting: Pulmonary Disease

## 2018-01-03 ENCOUNTER — Ambulatory Visit (INDEPENDENT_AMBULATORY_CARE_PROVIDER_SITE_OTHER): Payer: Medicare HMO | Admitting: *Deleted

## 2018-01-03 ENCOUNTER — Ambulatory Visit (INDEPENDENT_AMBULATORY_CARE_PROVIDER_SITE_OTHER): Payer: Medicare HMO | Admitting: Pulmonary Disease

## 2018-01-03 VITALS — BP 130/80 | HR 105 | Ht 73.0 in | Wt 195.4 lb

## 2018-01-03 DIAGNOSIS — J441 Chronic obstructive pulmonary disease with (acute) exacerbation: Secondary | ICD-10-CM

## 2018-01-03 DIAGNOSIS — R9431 Abnormal electrocardiogram [ECG] [EKG]: Secondary | ICD-10-CM | POA: Diagnosis not present

## 2018-01-03 DIAGNOSIS — R0600 Dyspnea, unspecified: Secondary | ICD-10-CM | POA: Diagnosis not present

## 2018-01-03 DIAGNOSIS — Z720 Tobacco use: Secondary | ICD-10-CM

## 2018-01-03 DIAGNOSIS — G4733 Obstructive sleep apnea (adult) (pediatric): Secondary | ICD-10-CM | POA: Diagnosis not present

## 2018-01-03 DIAGNOSIS — R0602 Shortness of breath: Secondary | ICD-10-CM | POA: Diagnosis not present

## 2018-01-03 DIAGNOSIS — R69 Illness, unspecified: Secondary | ICD-10-CM | POA: Diagnosis not present

## 2018-01-03 LAB — EKG 12-LEAD

## 2018-01-03 MED ORDER — ALBUTEROL SULFATE HFA 108 (90 BASE) MCG/ACT IN AERS: 2.0000 | INHALATION_SPRAY | RESPIRATORY_TRACT | 3 refills | Status: DC | PRN

## 2018-01-03 MED ORDER — ALBUTEROL SULFATE HFA 108 (90 BASE) MCG/ACT IN AERS: 1.0000 | INHALATION_SPRAY | RESPIRATORY_TRACT | 3 refills | Status: DC | PRN

## 2018-01-03 MED ORDER — PREDNISONE 10 MG PO TABS: ORAL_TABLET | ORAL | 0 refills | Status: DC

## 2018-01-03 MED ORDER — FLUTICASONE-UMECLIDIN-VILANT 100-62.5-25 MCG/INH IN AEPB: 1.0000 | INHALATION_SPRAY | Freq: Every day | RESPIRATORY_TRACT | 0 refills | Status: DC

## 2018-01-03 MED ORDER — ALBUTEROL SULFATE (2.5 MG/3ML) 0.083% IN NEBU: 2.5000 mg | INHALATION_SOLUTION | Freq: Four times a day (QID) | RESPIRATORY_TRACT | 3 refills | Status: AC | PRN

## 2018-01-03 NOTE — Assessment & Plan Note (Signed)
Trelegy stable provided today >>> Take this every day no matter what >>> Call the office if you run out this medication and cannot afford to pick it up from the pharmacy >>> Rinse your mouth out after using   Refilled your albuterol inhaler >>>Only use your albuterol as a rescue medication to be used if you can't catch your breath by resting or doing a relaxed purse lip breathing pattern.  - The less you use it, the better it will work when you need it. - Ok to use up to 2 puffs  every 4 hours if you must but call for immediate appointment if use goes up over your usual need - Don't leave home without it !!  (think of it like the spare tire for your car)   Continue to work towards stopping smoking >>> You can call 1 800 quit now for support as well as samples of nicotine gum >>> Reduce to quit- work towards slowly reducing her cigarette consumption each day.   >>>A good goal would be right in 2 months to be 0.5 packs/day consistently.   Qualified for portable oxygen today >>> Order placed with your home health care company >>> Order also placed for oxygen at night - 2L at night  >>> Wear oxygen during the day to maintain oxygen saturations greater than 90  Contact the office if you are having issues with your breathing or respiratory changes.

## 2018-01-03 NOTE — Progress Notes (Signed)
Discussed with patient at office visit today.  No other interventions are needed.  Patient to return in 2 weeks.  Wyn Quaker FNP

## 2018-01-03 NOTE — Patient Instructions (Addendum)
Trelegy stable provided today >>> Take this every day no matter what >>> Call the office if you run out this medication and cannot afford to pick it up from the pharmacy >>> Rinse your mouth out after using   Prednisone 4 tabs for 2 days, then 3 tabs for 2 days, 2 tabs for 2 days, then 1 tab for 2 days, then stop   Refilled your albuterol inhaler >>>Only use your albuterol as a rescue medication to be used if you can't catch your breath by resting or doing a relaxed purse lip breathing pattern.  - The less you use it, the better it will work when you need it. - Ok to use up to 2 puffs  every 4 hours if you must but call for immediate appointment if use goes up over your usual need - Don't leave home without it !!  (think of it like the spare tire for your car)   Continue to work towards stopping smoking >>> You can call 1 800 quit now for support as well as samples of nicotine gum >>> Reduce to quit- work towards slowly reducing her cigarette consumption each day.   >>>A good goal would be right in 2 months to be 0.5 packs/day consistently.   Qualified for portable oxygen today >>> Order placed with your home health care company >>> Order also placed for oxygen at night - 2L at night  >>> Wear oxygen during the day to maintain oxygen saturations greater than 90 >>> We will discharge you from the office today wearing 2 L of oxygen   Contact the office if you are having issues with your breathing or respiratory changes.  Follow-up with primary care >>> Notify their office of your concerns about managing anxiety >>> Schedule appointment with primary care to also discuss the the ectopic ventricular beats that we saw on your EKG today we will monitor these for now      Please contact the office if your symptoms worsen or you have concerns that you are not improving.   Thank you for choosing [facility] for your healthcare, and for allowing [place] to partner with you on your  healthcare journey. I am thankful to be able to provide care to you today.   Wyn Quaker FNP-C

## 2018-01-03 NOTE — Assessment & Plan Note (Signed)
EKG performed in office today.  EKG showing frequent ectopic ventricular beats.  Patient was moving during exam.  EKG interpretation shows atrial flutter fibrillation, upon interpretation you can visually see that it is regular as well as with P waves.  Patient agrees to follow-up with primary care

## 2018-01-03 NOTE — Progress Notes (Signed)
@Patient  ID: Cory Kemp, male    DOB: 06-Jul-1941, 77 y.o.   MRN: [id number]  Chief Complaint  Patient presents with  . Acute Visit    irratic heart rate, chest tightness, increased SOB. Using albuteral inhaler 5x/day. Smoking a pack/day.     Referring provider: Jani Gravel, MD  HPI: 77 year old male patient being followed for COPD Gold C (FEV1 41%), asthmatic bronchitis, active smoker  Recent [facility] Encounters:   11/14/17 Seen in office by Dr. Vaughan Browner.  Noncompliant with his CPAP.  Reports issues with his mask fit.  Patient reported that he stopped smoking at this appointment.  Patient says this is dependent on his mood and how the day goes.  Ordered home sleep study test on 11/14/2016.  Will start trilogy inhaler.  And give prednisone taper.    Tests:  Imaging:  01/03/2018-chest x-ray- severe emphysema without acute disease 07/02/2017-chest x-ray-stable emphysematous disease no acute issues 04/10/2016-CT Angie - no evidence of pulmonary embolism 09/12/2014-CT chest with contrast-mild emphysematous changes noted bilaterally, no pulmonary mass or nodules noted  Cardiac:  04/11/2016-echocardiogram -LV ejection fraction 60 to 65%   01/03/18 77 year old male patient presents today for an acute visit.  Status post walk in office where patient got short of breath as well as was symptomatic with rapid heartbeat.  EKG done in office today showing ectopic beats compared to previous EKG on 07/02/2017 showing similar ectopic beats.  Chest x-ray performed today showing no acute abnormality.  On assessment patient admits that they have not been using any sort of controller therapies in the with exertion gets short of breath and oxygen saturations dropped to the low 80s.  Patient checking oxygen saturations at home with sat probe.  Patient does report occasionally taking Anoro but mainly is taking albuterol and his nebulizer which is albuterol as needed for shortness of breath.  Patient reports  not taking trelegy at all over the past 2 weeks.  Patient also reports that he has resumed smoking at home mainly averaging 1 pack/day.  Since last appointment patient has had a home sleep study which reveals he does not have obstructive sleep apnea but he does have hypoxia at night.  Patient had an order placed yesterday for 2 L continuous oxygen at night.  Patient also qualified today in office for portable oxygen tank.      Allergies  Allergen Reactions  . Adhesive [Tape] Rash     There is no immunization history on file for this patient.  Past Medical History:  Diagnosis Date  . Alcohol abuse   . Anemia   . Arthritis   . Asthma   . B12 deficiency 11/03/2013  . BPH (benign prostatic hypertrophy)   . COPD (chronic obstructive pulmonary disease) ([place])   . Dementia    ?  . Diabetes mellitus ([place]) 01/10/2012  . Femoral hernia, right 03/27/2012  . Glucose intolerance (impaired glucose tolerance)   . Hyperlipidemia   . Hypertension   . Incisional hernias - swiss-cheese-type periumbilical 03/10/3824  . Inguinal hernia - right 01/10/2012  . Leukopenia 01/29/2013  . Monocytosis 01/29/2013  . Normochromic anemia 01/29/2013  . Osteopenia     Tobacco History: Social History   Tobacco Use  Smoking Status Current Every Day Smoker  . Packs/day: 0.50  . Types: Cigarettes  . Start date: 08/09/1955  Smokeless Tobacco Never Used   Ready to quit: Yes Counseling given: Yes Educated on importance of stopping smoking.  Patient agrees that he wants to stop.  Patient willing to use the reduced to quit method to stop smoking.  Patient agrees to work towards reducing from 1 pack/day to 0.5 packs/day over the next 2 months.  Outpatient Encounter Medications as of 01/03/2018  Medication Sig  . albuterol (PROVENTIL HFA;VENTOLIN HFA) 108 (90 Base) MCG/ACT inhaler Inhale 2 puffs into the lungs every 4 (four) hours as needed for wheezing or shortness of breath.  [name] Kitchen albuterol (PROVENTIL HFA;VENTOLIN HFA) 108  (90 Base) MCG/ACT inhaler Inhale 1-2 puffs into the lungs every 4 (four) hours as needed for wheezing or shortness of breath.  [name] Kitchen albuterol (PROVENTIL) (2.5 MG/3ML) 0.083% nebulizer solution Take 3 mLs (2.5 mg total) by nebulization every 6 (six) hours as needed for wheezing or shortness of breath.  Jearl Klinefelter ELLIPTA 62.5-25 MCG/INH AEPB INHALE 1 PUFF BY MOUTH INTO THE LUNGS DAILY  . budesonide-formoterol (SYMBICORT) 160-4.5 MCG/ACT inhaler Inhale 2 puffs into the lungs 2 (two) times daily.  . cholecalciferol (VITAMIN D) 1000 UNITS tablet Take 1,000 Units by mouth daily.   . Fluticasone-Umeclidin-Vilant (TRELEGY ELLIPTA) 100-62.5-25 MCG/INH AEPB Inhale 1 puff into the lungs daily.  [name] Kitchen losartan (COZAAR) 50 MG tablet Take 50 mg by mouth daily with breakfast.   . Naproxen Sodium (ALEVE) 220 MG CAPS Take 440 mg by mouth daily as needed (for pain). For pain  . predniSONE (DELTASONE) 10 MG tablet Take 4 tabs x 3 days, 3 tabs x 3days, 2 tabs x 3days, 1 tab x 3 days.  . simvastatin (ZOCOR) 40 MG tablet Take 40 mg by mouth at bedtime.   . vitamin B-12 (CYANOCOBALAMIN) 100 MCG tablet Take 100 mcg by mouth daily.  . [DISCONTINUED] albuterol (PROVENTIL HFA;VENTOLIN HFA) 108 (90 Base) MCG/ACT inhaler Inhale 2 puffs into the lungs every 4 (four) hours as needed for wheezing or shortness of breath.  . [DISCONTINUED] albuterol (PROVENTIL HFA;VENTOLIN HFA) 108 (90 Base) MCG/ACT inhaler INHALE 2 PUFFS INTO THE LUNGS EVERY 4 HOURS FOR WHEEZING OR SHORTNESS OF BREATH  . [DISCONTINUED] albuterol (PROVENTIL) (2.5 MG/3ML) 0.083% nebulizer solution Take 2.5 mg by nebulization every 6 (six) hours as needed for wheezing or shortness of breath.  . Fluticasone-Umeclidin-Vilant (TRELEGY ELLIPTA) 100-62.5-25 MCG/INH AEPB Inhale 1 puff into the lungs daily.  . predniSONE (DELTASONE) 10 MG tablet 4 tabs for 2 days, then 3 tabs for 2 days, 2 tabs for 2 days, then 1 tab for 2 days, then stop   No facility-administered encounter  medications on file as of 01/03/2018.      Review of Systems  Constitutional: +fatigue especially in the AMs  No  weight loss, night sweats,  fevers, chills HEENT: +post nasal drip  No headaches,  Difficulty swallowing,  Tooth/dental problems, or  Sore throat, No sneezing, itching, ear ache CV: No chest pain,  orthopnea, PND, swelling in lower extremities, anasarca, dizziness, palpitations, syncope  GI: No heartburn, indigestion, abdominal pain, nausea, vomiting, diarrhea, change in bowel habits, loss of appetite, bloody stools Resp: +sob with exertion and off oxygen, Some shortness of breath at rest.  No excess mucus, no productive cough,  No non-productive cough,  No coughing up of blood.  No change in color of mucus.  No wheezing.  No chest wall deformity Skin: no rash, lesions, no skin changes. GU: no dysuria, change in color of urine, no urgency or frequency.  No flank pain, no hematuria  MS:  No joint pain or swelling.  No decreased range of motion.  No back pain. Psych:  +anxiety when sob, No change  in mood or affect. No depression or anxiety.  No memory loss.   Physical Exam  BP 130/80   Pulse (!) 105   Ht 6\' 1"  (1.854 m)   Wt 195 lb 6.4 oz (88.6 kg)   SpO2 92%   BMI 25.78 kg/m   GEN: A/Ox3; pleasant , NAD, well nourished, chronically ill    HEENT:  [place]/AT,  EACs-clear, TMs-wnl, NOSE-clear, THROAT- +post nasal drip, no lesions, no   NECK:  Supple w/ fair ROM; no JVD; normal carotid impulses w/o bruits; no thyromegaly or nodules palpated; no lymphadenopathy.    RESP: +RUL and LUL  expiratory wheezes, deep breaths illcit cough, diminished in bases . no accessory muscle use, no dullness to percussion  CARD:  +1 LE edema bilaterally RRR, no m/r/g, pulses intact, no cyanosis or clubbing.  GI:   Soft & nt; nml bowel sounds; no organomegaly or masses detected.   Musco: Warm bil, no deformities or joint swelling noted.   Neuro: alert, no focal deficits noted.    Skin: Warm,  no lesions or rashes, dry flaky skin  EKG in office today shows ectopic beats.  In comparison to 06/2017 EKG shows similar tachycardia and ectopic beats.  Patient was moving during his EKG in office today which is why I believe he is showing A. fib.  P waves present on EKG as well as a regular rate.  Chest XRAY in today shows no acute abnormality or pneumonia.  Lab Results:  CBC    Component Value Date/Time   WBC 4.2 07/02/2017 1036   RBC 4.59 07/02/2017 1036   HGB 13.5 07/02/2017 1036   HGB 13.3 06/25/2013 0822   HCT 42.1 07/02/2017 1036   HCT 39.8 06/25/2013 0822   PLT 158 07/02/2017 1036   PLT 165 06/25/2013 0822   MCV 91.7 07/02/2017 1036   MCV 89.4 06/25/2013 0822   MCH 29.4 07/02/2017 1036   MCHC 32.1 07/02/2017 1036   RDW 14.5 07/02/2017 1036   RDW 14.3 06/25/2013 0822   LYMPHSABS 2.3 07/02/2017 1036   LYMPHSABS 1.3 06/25/2013 0822   MONOABS 0.5 07/02/2017 1036   MONOABS 0.6 06/25/2013 0822   EOSABS 0.0 07/02/2017 1036   EOSABS 0.0 06/25/2013 0822   BASOSABS 0.0 07/02/2017 1036   BASOSABS 0.0 06/25/2013 0822    BMET    Component Value Date/Time   NA 139 07/02/2017 1036   NA 140 02/20/2013 1148   K 5.1 07/02/2017 1036   K 4.1 02/20/2013 1148   CL 105 07/02/2017 1036   CO2 27 07/02/2017 1036   CO2 26 02/20/2013 1148   GLUCOSE 115 (H) 07/02/2017 1036   GLUCOSE 89 02/20/2013 1148   BUN 18 07/02/2017 1036   BUN 12.8 02/20/2013 1148   CREATININE 0.90 07/02/2017 1036   CREATININE 0.8 02/20/2013 1148   CALCIUM 8.4 (L) 07/02/2017 1036   CALCIUM 9.2 02/20/2013 1148   GFRNONAA >60 07/02/2017 1036   GFRAA >60 07/02/2017 1036    BNP    Component Value Date/Time   BNP 106.9 (H) 04/10/2016 1838    ProBNP No results found for: PROBNP  Imaging: Dg Chest 2 View  Result Date: 01/03/2018 CLINICAL DATA:  Worsening chronic shortness of breath in a patient with a history of COPD. EXAM: CHEST - 2 VIEW COMPARISON:  PA and lateral chest 06/13/2017 and 04/10/2016. CT  chest 04/10/2016. FINDINGS: The lungs are severely emphysematous but clear. Bullous change in the right lung base is again seen. No pneumothorax or pleural  effusion. Heart size is normal. Aortic atherosclerosis is noted. IMPRESSION: Severe emphysema without acute disease. Atherosclerosis. Electronically Signed   By: Inge Rise M.D.   On: 01/03/2018 09:42     Assessment & Plan:   Pleasant 77 year old patient seen today.  Patient with minor COPD exacerbation, expiratory wheeze, and hypoxia.  Patient to be given a prednisone taper.  Trelegy samples provided for patient today.  Patient to take this daily.  Refilled rescue inhaler as well as albuterol nebulizer.    Patient qualified for portable oxygen order placed for DME.  Patient also knows to wear 2 L of oxygen at night continuously.  Due to symptoms as well as oxygen saturations in office today. Patient will be discharged on portable E tank from his home health care company from office today.  Educated on importance of not smoking cigarettes while wearing 2 L of oxygen.  Patient agrees to work to do reduced to quit with a goal of coming down to 0.5 packs/day within the next 2 months.    Patient to follow-up with me in 2 weeks.  And then after that follow-up with Dr. Vaughan Browner.  Patient also to follow-up with primary care regarding abnormal EKG as well as occasional symptoms of anxiety that he is looking to manage chronically.    Tobacco abuse  Continue to work towards stopping smoking >>> You can call 1 800 quit now for support as well as samples of nicotine gum >>> Reduce to quit- work towards slowly reducing her cigarette consumption each day.   >>>A good goal would be right in 2 months to be 0.5 packs/day consistently.    Follow-up with primary care >>>discuss your desire to stop smoking    COPD (chronic obstructive pulmonary disease) ([place]) Trelegy stable provided today >>> Take this every day no matter what >>> Call the office if  you run out this medication and cannot afford to pick it up from the pharmacy >>> Rinse your mouth out after using   Refilled your albuterol inhaler >>>Only use your albuterol as a rescue medication to be used if you can't catch your breath by resting or doing a relaxed purse lip breathing pattern.  - The less you use it, the better it will work when you need it. - Ok to use up to 2 puffs  every 4 hours if you must but call for immediate appointment if use goes up over your usual need - Don't leave home without it !!  (think of it like the spare tire for your car)   Continue to work towards stopping smoking >>> You can call 1 800 quit now for support as well as samples of nicotine gum >>> Reduce to quit- work towards slowly reducing her cigarette consumption each day.   >>>A good goal would be right in 2 months to be 0.5 packs/day consistently.   Qualified for portable oxygen today >>> Order placed with your home health care company >>> Order also placed for oxygen at night - 2L at night  >>> Wear oxygen during the day to maintain oxygen saturations greater than 90  Contact the office if you are having issues with your breathing or respiratory changes.   Abnormal finding on EKG EKG performed in office today.  EKG showing frequent ectopic ventricular beats.  Patient was moving during exam.  EKG interpretation shows atrial flutter fibrillation, upon interpretation you can visually see that it is regular as well as with P waves.  Patient agrees to follow-up with  primary care  OSA (obstructive sleep apnea) resolved.  Home sleep study showed an AHI under 0.8.  Did show moderate hypoxia.  Patient to start 2 L of oxygen at night  This appointment was 45 minutes long with over 50% of that time in direct face-to-face patient care.  As well as reviewing in office EKG with previous EKG.  Reviewing chest x-ray today.  Discussing reduced to quit smoking with patient.  Discussing DME orders 2 L of  oxygen at night.  As well as the portable oxygen today.   Lauraine Rinne, NP 01/03/2018

## 2018-01-03 NOTE — Assessment & Plan Note (Addendum)
resolved.  Home sleep study showed an AHI of 0.8.  Did show moderate hypoxia.  Patient to start 2 L of oxygen at night

## 2018-01-03 NOTE — Progress Notes (Signed)
Reviewed with patient in office today.  Patient was moving during exam which I think is what caused the EKG to appear like A. fib or a flutter.  Definite ectopic beats which are similar compared to his November 2018 EKG.  Patient denies chest pain or any other sort of cardiac symptoms currently on assessment.  Patient to follow up with primary care regarding his abnormal EKGs.  Wyn Quaker FNP

## 2018-01-03 NOTE — Assessment & Plan Note (Signed)
  Continue to work towards stopping smoking >>> You can call 1 800 quit now for support as well as samples of nicotine gum >>> Reduce to quit- work towards slowly reducing her cigarette consumption each day.   >>>A good goal would be right in 2 months to be 0.5 packs/day consistently.    Follow-up with primary care >>>discuss your desire to stop smoking

## 2018-01-04 ENCOUNTER — Ambulatory Visit: Payer: Medicare HMO | Admitting: Pulmonary Disease

## 2018-01-05 ENCOUNTER — Telehealth: Payer: Self-pay | Admitting: Pulmonary Disease

## 2018-01-05 ENCOUNTER — Ambulatory Visit: Payer: Medicare HMO | Admitting: Pulmonary Disease

## 2018-01-05 DIAGNOSIS — R0902 Hypoxemia: Secondary | ICD-10-CM | POA: Diagnosis not present

## 2018-01-05 DIAGNOSIS — I10 Essential (primary) hypertension: Secondary | ICD-10-CM | POA: Diagnosis not present

## 2018-01-05 DIAGNOSIS — J441 Chronic obstructive pulmonary disease with (acute) exacerbation: Secondary | ICD-10-CM | POA: Diagnosis not present

## 2018-01-05 DIAGNOSIS — R062 Wheezing: Secondary | ICD-10-CM | POA: Diagnosis not present

## 2018-01-05 DIAGNOSIS — R0689 Other abnormalities of breathing: Secondary | ICD-10-CM | POA: Diagnosis not present

## 2018-01-05 DIAGNOSIS — R0602 Shortness of breath: Secondary | ICD-10-CM | POA: Diagnosis not present

## 2018-01-05 NOTE — Telephone Encounter (Signed)
Called and spoke to patient's daughter, [name], who is listed on DRP. She wanted an update of what patient needs to be doing since she was not able to be at the last visit. [name] stated that patient gets confused and wasn't sure. Reviewed information with her from last visit. Nothing further is needed at this time.

## 2018-01-08 NOTE — Telephone Encounter (Signed)
Error

## 2018-01-16 ENCOUNTER — Telehealth: Payer: Self-pay | Admitting: Pulmonary Disease

## 2018-01-16 DIAGNOSIS — Z0000 Encounter for general adult medical examination without abnormal findings: Secondary | ICD-10-CM | POA: Diagnosis not present

## 2018-01-16 DIAGNOSIS — E7800 Pure hypercholesterolemia, unspecified: Secondary | ICD-10-CM | POA: Diagnosis not present

## 2018-01-16 DIAGNOSIS — I10 Essential (primary) hypertension: Secondary | ICD-10-CM | POA: Diagnosis not present

## 2018-01-16 DIAGNOSIS — D649 Anemia, unspecified: Secondary | ICD-10-CM | POA: Diagnosis not present

## 2018-01-16 DIAGNOSIS — R69 Illness, unspecified: Secondary | ICD-10-CM | POA: Diagnosis not present

## 2018-01-16 NOTE — Telephone Encounter (Signed)
Spoke with Cory Kemp and she wa son the other line and stated she would call back after she gets off the phone. Will await return call.

## 2018-01-16 NOTE — Telephone Encounter (Signed)
Called and spoke with patients daughter. She wanted to make sure that the patient needed to be on 2 liters.   Advised that is correct. Nothing further needed.

## 2018-01-16 NOTE — Telephone Encounter (Signed)
Patient daughter Cory Kemp) returned phone call

## 2018-01-17 ENCOUNTER — Ambulatory Visit: Payer: Medicare HMO | Admitting: Pulmonary Disease

## 2018-02-02 ENCOUNTER — Ambulatory Visit (INDEPENDENT_AMBULATORY_CARE_PROVIDER_SITE_OTHER): Payer: Medicare HMO | Admitting: Pulmonary Disease

## 2018-02-02 ENCOUNTER — Encounter: Payer: Self-pay | Admitting: Pulmonary Disease

## 2018-02-02 VITALS — BP 132/78 | HR 89 | Ht 73.0 in | Wt 188.0 lb

## 2018-02-02 DIAGNOSIS — Z720 Tobacco use: Secondary | ICD-10-CM

## 2018-02-02 DIAGNOSIS — J441 Chronic obstructive pulmonary disease with (acute) exacerbation: Secondary | ICD-10-CM

## 2018-02-02 DIAGNOSIS — R69 Illness, unspecified: Secondary | ICD-10-CM | POA: Diagnosis not present

## 2018-02-02 DIAGNOSIS — F17210 Nicotine dependence, cigarettes, uncomplicated: Secondary | ICD-10-CM | POA: Diagnosis not present

## 2018-02-02 DIAGNOSIS — Z23 Encounter for immunization: Secondary | ICD-10-CM

## 2018-02-02 MED ORDER — FLUTICASONE-UMECLIDIN-VILANT 100-62.5-25 MCG/INH IN AEPB: 1.0000 | INHALATION_SPRAY | Freq: Every day | RESPIRATORY_TRACT | 12 refills | Status: DC

## 2018-02-02 NOTE — Patient Instructions (Signed)
We will give you the Prevnar vaccine today Renew your trelegy.  Make sure that you use this every day Continue to work on smoking cessation Follow-up in 3 months.

## 2018-02-02 NOTE — Addendum Note (Signed)
Addended by: Shirlee More R on: 02/02/2018 11:08 AM   Modules accepted: Orders

## 2018-02-02 NOTE — Progress Notes (Signed)
Cory Kemp    [id number]    1940-08-09  Primary Care Physician:Kim, Jeneen Rinks, MD  Referring Physician: Jani Gravel, MD [address] [place], [place] [address]  Chief complaint:   Follow up for COPD GOLD C (FEV1 41%, CAT score 8) Asthmatic bronchitis Active smoker  HPI: Mr. [name] is a 77 year old chief complaint of dyspnea on exertion for the past 2 years. This is associated with cough, white mucus production, chest congestion, wheezing. He is a current 1 pack per day smoker. He is on albuterol which helps with her symptoms. He had been tried on various inhalers including Advair, Breo and Spiriva which did not help.  Interim history: Given trelegy inhaler.  He is not using this every day since he feels it is not helping with the breathing Uses the albuterol up to 4 times a day Has daily symptoms of dyspnea, cough with congestion. Started on supplemental oxygen.  Outpatient Encounter Medications as of 02/02/2018  Medication Sig  . albuterol (PROVENTIL HFA;VENTOLIN HFA) 108 (90 Base) MCG/ACT inhaler Inhale 2 puffs into the lungs every 4 (four) hours as needed for wheezing or shortness of breath.  [name] Kitchen albuterol (PROVENTIL) (2.5 MG/3ML) 0.083% nebulizer solution Take 3 mLs (2.5 mg total) by nebulization every 6 (six) hours as needed for wheezing or shortness of breath.  . cholecalciferol (VITAMIN D) 1000 UNITS tablet Take 1,000 Units by mouth daily.   [name] Kitchen losartan (COZAAR) 50 MG tablet Take 50 mg by mouth daily with breakfast.   . Naproxen Sodium (ALEVE) 220 MG CAPS Take 440 mg by mouth daily as needed (for pain). For pain  . simvastatin (ZOCOR) 40 MG tablet Take 40 mg by mouth at bedtime.   . vitamin B-12 (CYANOCOBALAMIN) 100 MCG tablet Take 100 mcg by mouth daily.  . [DISCONTINUED] albuterol (PROVENTIL HFA;VENTOLIN HFA) 108 (90 Base) MCG/ACT inhaler Inhale 1-2 puffs into the lungs every 4 (four) hours as needed for wheezing or shortness of breath.  . [DISCONTINUED]  ANORO ELLIPTA 62.5-25 MCG/INH AEPB INHALE 1 PUFF BY MOUTH INTO THE LUNGS DAILY  . [DISCONTINUED] budesonide-formoterol (SYMBICORT) 160-4.5 MCG/ACT inhaler Inhale 2 puffs into the lungs 2 (two) times daily.  . [DISCONTINUED] Fluticasone-Umeclidin-Vilant (TRELEGY ELLIPTA) 100-62.5-25 MCG/INH AEPB Inhale 1 puff into the lungs daily.  . [DISCONTINUED] predniSONE (DELTASONE) 10 MG tablet Take 4 tabs x 3 days, 3 tabs x 3days, 2 tabs x 3days, 1 tab x 3 days.  . [DISCONTINUED] predniSONE (DELTASONE) 10 MG tablet 4 tabs for 2 days, then 3 tabs for 2 days, 2 tabs for 2 days, then 1 tab for 2 days, then stop  . Fluticasone-Umeclidin-Vilant (TRELEGY ELLIPTA) 100-62.5-25 MCG/INH AEPB Inhale 1 puff into the lungs daily. (Patient not taking: Reported on 02/02/2018)   No facility-administered encounter medications on file as of 02/02/2018.     Allergies as of 02/02/2018 - Review Complete 02/02/2018  Allergen Reaction Noted  . Adhesive [tape] Rash 02/26/2012    Past Medical History:  Diagnosis Date  . Alcohol abuse   . Anemia   . Arthritis   . Asthma   . B12 deficiency 11/03/2013  . BPH (benign prostatic hypertrophy)   . COPD (chronic obstructive pulmonary disease) ([place])   . Dementia    ?  . Diabetes mellitus ([place]) 01/10/2012  . Femoral hernia, right 03/27/2012  . Glucose intolerance (impaired glucose tolerance)   . Hyperlipidemia   . Hypertension   . Incisional hernias - swiss-cheese-type periumbilical 04/13/7590  .  Inguinal hernia - right 01/10/2012  . Leukopenia 01/29/2013  . Monocytosis 01/29/2013  . Normochromic anemia 01/29/2013  . Osteopenia     Past Surgical History:  Procedure Laterality Date  . HEMORRHOID SURGERY    . HERNIA REPAIR    . INGUINAL HERNIA REPAIR  02/24/2012   Procedure: LAPAROSCOPIC INGUINAL HERNIA;  Surgeon: Adin Hector, MD;  Location: [facility];  Service: General;  Laterality: N/A;  . TUMOR EXCISION     intestinal after SBO ( Benign), appendectomy  . VENTRAL HERNIA REPAIR   02/24/2012   Procedure: LAPAROSCOPIC VENTRAL HERNIA;  Surgeon: Adin Hector, MD;  Location: [facility];  Service: General;  Laterality: N/A;  lysis of adhesions, excision of abdominal wall lipomae    Family History  Problem Relation Age of Onset  . Lung cancer Father   . Lung cancer Mother   . Diabetes Brother     Social History   Socioeconomic History  . Marital status: Single    Spouse name: Not on file  . Number of children: 2  . Years of education: Not on file  . Highest education level: Not on file  Occupational History  . Occupation: Retired    Comment: International aid/development worker  . Financial resource strain: Not on file  . Food insecurity:    Worry: Not on file    Inability: Not on file  . Transportation needs:    Medical: Not on file    Non-medical: Not on file  Tobacco Use  . Smoking status: Current Every Day Smoker    Packs/day: 1.00    Years: 60.00    Pack years: 60.00    Types: Cigarettes    Start date: 08/09/1955  . Smokeless tobacco: Never Used  Substance and Sexual Activity  . Alcohol use: No    Alcohol/week: 0.0 oz  . Drug use: No  . Sexual activity: Not on file  Lifestyle  . Physical activity:    Days per week: Not on file    Minutes per session: Not on file  . Stress: Not on file  Relationships  . Social connections:    Talks on phone: Not on file    Gets together: Not on file    Attends religious service: Not on file    Active member of club or organization: Not on file    Attends meetings of clubs or organizations: Not on file    Relationship status: Not on file  . Intimate partner violence:    Fear of current or ex partner: Not on file    Emotionally abused: Not on file    Physically abused: Not on file    Forced sexual activity: Not on file  Other Topics Concern  . Not on file  Social History Narrative  . Not on file   Review of systems: Review of Systems  Constitutional: Negative for fever and chills.  HENT: Negative.   Eyes:  Negative for blurred vision.  Respiratory: as per HPI  Cardiovascular: Negative for chest pain and palpitations.  Gastrointestinal: Negative for vomiting, diarrhea, blood per rectum. Genitourinary: Negative for dysuria, urgency, frequency and hematuria.  Musculoskeletal: Negative for myalgias, back pain and joint pain.  Skin: Negative for itching and rash.  Neurological: Negative for dizziness, tremors, focal weakness, seizures and loss of consciousness.  Endo/Heme/Allergies: Negative for environmental allergies.  Psychiatric/Behavioral: Negative for depression, suicidal ideas and hallucinations.  All other systems reviewed and are negative.  Physical Exam: Blood pressure 132/78, pulse 89,  height 6\' 1"  (1.854 m), weight 188 lb (85.3 kg), SpO2 90 %. Gen:      No acute distress HEENT:  EOMI, sclera anicteric Neck:     No masses; no thyromegaly Lungs:    Scattered bilateral crackles, no wheeze. CV:         Regular rate and rhythm; no murmurs Abd:      + bowel sounds; soft, non-tender; no palpable masses, no distension Ext:    No edema; adequate peripheral perfusion Skin:      Warm and dry; no rash Neuro: alert and oriented x 3 Psych: normal mood and affect  Data Reviewed: CT chest 09/12/14- Emphysematous changes noted bilaterally. Minimal posterior basilar scarring or subsegmental atelectasis is noted. No pulmonary mass or nodule is noted CT angiogram 04/10/16- emphysema, chronic bronchitis.  Subsegmental atelectasis at the bases mildly increased compared to previous Chest x-ray 07/02/17-hyperinflated lungs.  No acute abnormality. I have reviewed the images personally.  PFTs 01/21/16 FVC 2.44 (71%), FEV1 1.47 [41%), F/F 43, TLC 122%, RV/TLC 166%, DLCO 48% Severe obstruction with reduction in diffusion capacity. Hyperinflation with air trapping.  Home sleep test 11/14/16 Moderate OSA with desats.  In lab sleep study 12/16/2017 AHI 0.8, nocturnal desaturations.  Assessment:  COPD    Prescribed trelegy but is not using it every day with using albuterol instead Emphasized that he needs to use this as a controller medication every day irrespective of how he is feeling Continue albuterol as needed   Although they do not meet the formal criteria he does have a big improvement in FEV1 post bronchodilator suggestive of an asthmatic component. He will benefit from inhaled corticosteroids as part of his inhaler regimen.  Continue supplemental oxygen  Nocturnal hypoxia Sleep study reviewed which did not show any sleep apnea.  He does have nocturnal desats and is started on supplemental oxygen  Active smoker Started smoking again.  Smoking cessation discussed.  He wants to quit on his own without nicotine patches or Chantix Reassess at next visit..  Time spent counseling-5 minutes  Health maintenance Give Prevnar vaccine today.  Pneumovax next year  Plan/Recommendations: - Renew trelegy.  Use this every day.  Albuterol as needed - Supplemental oxygen - Smoking cessation - Prevnar vaccine.  Return in 3 months  [name] MD [facility] 02/02/2018, 10:15 AM  CC: Jani Gravel, MD

## 2018-02-02 NOTE — Progress Notes (Signed)
Inhaler training given. In-check peak flow #:70 

## 2018-02-04 DIAGNOSIS — J441 Chronic obstructive pulmonary disease with (acute) exacerbation: Secondary | ICD-10-CM | POA: Diagnosis not present

## 2018-02-12 DIAGNOSIS — Z0000 Encounter for general adult medical examination without abnormal findings: Secondary | ICD-10-CM | POA: Diagnosis not present

## 2018-02-12 DIAGNOSIS — R319 Hematuria, unspecified: Secondary | ICD-10-CM | POA: Diagnosis not present

## 2018-02-12 DIAGNOSIS — Z125 Encounter for screening for malignant neoplasm of prostate: Secondary | ICD-10-CM | POA: Diagnosis not present

## 2018-02-12 DIAGNOSIS — E7800 Pure hypercholesterolemia, unspecified: Secondary | ICD-10-CM | POA: Diagnosis not present

## 2018-02-12 DIAGNOSIS — I10 Essential (primary) hypertension: Secondary | ICD-10-CM | POA: Diagnosis not present

## 2018-02-12 DIAGNOSIS — N390 Urinary tract infection, site not specified: Secondary | ICD-10-CM | POA: Diagnosis not present

## 2018-02-12 DIAGNOSIS — R69 Illness, unspecified: Secondary | ICD-10-CM | POA: Diagnosis not present

## 2018-02-19 DIAGNOSIS — R3129 Other microscopic hematuria: Secondary | ICD-10-CM | POA: Diagnosis not present

## 2018-02-19 DIAGNOSIS — R69 Illness, unspecified: Secondary | ICD-10-CM | POA: Diagnosis not present

## 2018-02-19 DIAGNOSIS — Z0000 Encounter for general adult medical examination without abnormal findings: Secondary | ICD-10-CM | POA: Diagnosis not present

## 2018-02-19 DIAGNOSIS — D649 Anemia, unspecified: Secondary | ICD-10-CM | POA: Diagnosis not present

## 2018-02-19 DIAGNOSIS — E119 Type 2 diabetes mellitus without complications: Secondary | ICD-10-CM | POA: Diagnosis not present

## 2018-02-19 DIAGNOSIS — E7800 Pure hypercholesterolemia, unspecified: Secondary | ICD-10-CM | POA: Diagnosis not present

## 2018-02-19 DIAGNOSIS — Z23 Encounter for immunization: Secondary | ICD-10-CM | POA: Diagnosis not present

## 2018-02-19 DIAGNOSIS — I10 Essential (primary) hypertension: Secondary | ICD-10-CM | POA: Diagnosis not present

## 2018-03-07 DIAGNOSIS — J441 Chronic obstructive pulmonary disease with (acute) exacerbation: Secondary | ICD-10-CM | POA: Diagnosis not present

## 2018-03-08 DIAGNOSIS — Z9079 Acquired absence of other genital organ(s): Secondary | ICD-10-CM | POA: Diagnosis not present

## 2018-03-08 DIAGNOSIS — R399 Unspecified symptoms and signs involving the genitourinary system: Secondary | ICD-10-CM | POA: Diagnosis not present

## 2018-03-08 DIAGNOSIS — R3121 Asymptomatic microscopic hematuria: Secondary | ICD-10-CM | POA: Diagnosis not present

## 2018-03-08 DIAGNOSIS — C61 Malignant neoplasm of prostate: Secondary | ICD-10-CM | POA: Diagnosis not present

## 2018-04-07 DIAGNOSIS — J441 Chronic obstructive pulmonary disease with (acute) exacerbation: Secondary | ICD-10-CM | POA: Diagnosis not present

## 2018-05-07 DIAGNOSIS — J441 Chronic obstructive pulmonary disease with (acute) exacerbation: Secondary | ICD-10-CM | POA: Diagnosis not present

## 2018-06-07 DIAGNOSIS — J441 Chronic obstructive pulmonary disease with (acute) exacerbation: Secondary | ICD-10-CM | POA: Diagnosis not present

## 2018-07-07 DIAGNOSIS — J441 Chronic obstructive pulmonary disease with (acute) exacerbation: Secondary | ICD-10-CM | POA: Diagnosis not present

## 2018-08-07 DIAGNOSIS — J441 Chronic obstructive pulmonary disease with (acute) exacerbation: Secondary | ICD-10-CM | POA: Diagnosis not present

## 2018-08-20 ENCOUNTER — Telehealth: Payer: Self-pay | Admitting: Pulmonary Disease

## 2018-08-20 NOTE — Telephone Encounter (Signed)
Noted and will send to [facility] to update/addend his notes for this information needed.

## 2018-08-21 NOTE — Telephone Encounter (Signed)
Spoke with Jeanett Schlein at [facility]-states no addendum needed to OV notes and just need them faxed to her. This has been taken care and nothing more needed at this time.

## 2018-08-21 NOTE — Telephone Encounter (Signed)
Judye Bos needs office notes for date June 13,2018.  Phone number is [phone].  Fax number is [phone].

## 2018-08-28 ENCOUNTER — Encounter (HOSPITAL_COMMUNITY): Payer: Self-pay | Admitting: Emergency Medicine

## 2018-08-28 ENCOUNTER — Emergency Department (HOSPITAL_COMMUNITY)
Admission: EM | Admit: 2018-08-28 | Discharge: 2018-08-28 | Disposition: A | Discharge status: Home / Self Care | Payer: Medicare HMO | Attending: Emergency Medicine | Admitting: Emergency Medicine

## 2018-08-28 ENCOUNTER — Emergency Department (HOSPITAL_COMMUNITY): Payer: Medicare HMO

## 2018-08-28 ENCOUNTER — Telehealth: Payer: Self-pay | Admitting: Pulmonary Disease

## 2018-08-28 DIAGNOSIS — Z96641 Presence of right artificial hip joint: Secondary | ICD-10-CM | POA: Diagnosis not present

## 2018-08-28 DIAGNOSIS — R69 Illness, unspecified: Secondary | ICD-10-CM | POA: Diagnosis not present

## 2018-08-28 DIAGNOSIS — M25551 Pain in right hip: Secondary | ICD-10-CM | POA: Diagnosis not present

## 2018-08-28 DIAGNOSIS — I10 Essential (primary) hypertension: Secondary | ICD-10-CM | POA: Diagnosis not present

## 2018-08-28 DIAGNOSIS — F0390 Unspecified dementia without behavioral disturbance: Secondary | ICD-10-CM | POA: Diagnosis not present

## 2018-08-28 DIAGNOSIS — Z79899 Other long term (current) drug therapy: Secondary | ICD-10-CM | POA: Diagnosis not present

## 2018-08-28 DIAGNOSIS — Z87891 Personal history of nicotine dependence: Secondary | ICD-10-CM | POA: Diagnosis not present

## 2018-08-28 DIAGNOSIS — J449 Chronic obstructive pulmonary disease, unspecified: Secondary | ICD-10-CM | POA: Insufficient documentation

## 2018-08-28 DIAGNOSIS — E119 Type 2 diabetes mellitus without complications: Secondary | ICD-10-CM | POA: Insufficient documentation

## 2018-08-28 MED ORDER — DICLOFENAC SODIUM 1 % TD GEL: 4.0000 g | Freq: Four times a day (QID) | TRANSDERMAL | 0 refills | Status: DC

## 2018-08-28 MED ORDER — DICLOFENAC SODIUM 1 % TD GEL
4.0000 g | Freq: Once | TRANSDERMAL | Status: AC
  Administered 2018-08-28: 4 g via TOPICAL
  Filled 2018-08-28: qty 100

## 2018-08-28 NOTE — ED Notes (Signed)
Pt given discharge instructions. Pt verbalized understanding. Prescription reviewed with patient.

## 2018-08-28 NOTE — Telephone Encounter (Signed)
Spoke with Cory Kemp and advised her that we only had an OV from June/2019. I advised her that I would fax the OV notes to the number given. Nothing further is needed.

## 2018-08-28 NOTE — ED Triage Notes (Addendum)
Pt with right side groin pain with no radiation with no injury. Began as soreness and is increasing affecting his gait. He reports using a crutch to walk. No redness or swelling reported.

## 2018-08-28 NOTE — Discharge Instructions (Signed)
Take tylenol 1000mg (2 extra strength) four times a day.   Use the gel as prescribed.  Follow up with ortho and your family doc.  Return for inability to walk, fever.

## 2018-08-28 NOTE — ED Provider Notes (Signed)
[facility] EMERGENCY DEPARTMENT Provider Note   CSN: [id number] Arrival date & time: 08/28/18  1222     History   Chief Complaint Chief Complaint  Patient presents with  . Leg Pain  . Groin Pain    HPI Cory Kemp is a 78 y.o. male.  78 yo M with a chief complaint of right groin pain.  Going on for the past couple days.  Denies trauma.  Denies fevers denies bulging or masses.  Been having trouble getting into the bathtub due to lifting his leg and so has not been able to clean himself.  This morning it got worse to the point where he was having trouble walking and so came to the ED for evaluation.  The history is provided by the patient.  Leg Pain  Location:  Leg Time since incident:  2 days Injury: no   Leg location:  R upper leg Pain details:    Quality:  Aching and cramping   Radiates to:  Groin   Severity:  Moderate   Onset quality:  Sudden   Duration:  2 days   Timing:  Constant   Progression:  Worsening Chronicity:  New Dislocation: no   Foreign body present:  No foreign bodies Prior injury to area:  No Relieved by:  Nothing Worsened by:  Adduction, abduction, activity and bearing weight Ineffective treatments:  None tried Associated symptoms: no fever   Groin Pain  Pertinent negatives include no chest pain, no abdominal pain, no headaches and no shortness of breath.    Past Medical History:  Diagnosis Date  . Alcohol abuse   . Anemia   . Arthritis   . Asthma   . B12 deficiency 11/03/2013  . BPH (benign prostatic hypertrophy)   . COPD (chronic obstructive pulmonary disease) ([place])   . Dementia ([place])    ?  [name] Kitchen Diabetes mellitus ([place]) 01/10/2012  . Femoral hernia, right 03/27/2012  . Glucose intolerance (impaired glucose tolerance)   . Hyperlipidemia   . Hypertension   . Incisional hernias - swiss-cheese-type periumbilical 11/13/5460  . Inguinal hernia - right 01/10/2012  . Leukopenia 01/29/2013  . Monocytosis 01/29/2013  . Normochromic  anemia 01/29/2013  . Osteopenia     Patient Active Problem List   Diagnosis Date Noted  . Abnormal finding on EKG 01/03/2018  . OSA (obstructive sleep apnea) 06/14/2017  . Sinus tachycardia 04/11/2016  . Essential hypertension 04/11/2016  . COPD (chronic obstructive pulmonary disease) ([place]) 01/11/2016  . B12 deficiency 11/03/2013  . Leukopenia 01/29/2013  . Monocytosis 01/29/2013  . Normochromic anemia 01/29/2013  . Tobacco abuse 03/27/2012  . Diabetes mellitus ([place]) 01/10/2012    Past Surgical History:  Procedure Laterality Date  . HEMORRHOID SURGERY    . HERNIA REPAIR    . INGUINAL HERNIA REPAIR  02/24/2012   Procedure: LAPAROSCOPIC INGUINAL HERNIA;  Surgeon: Adin Hector, MD;  Location: [facility];  Service: General;  Laterality: N/A;  . TUMOR EXCISION     intestinal after SBO ( Benign), appendectomy  . VENTRAL HERNIA REPAIR  02/24/2012   Procedure: LAPAROSCOPIC VENTRAL HERNIA;  Surgeon: Adin Hector, MD;  Location: [facility];  Service: General;  Laterality: N/A;  lysis of adhesions, excision of abdominal wall lipomae        Home Medications    Prior to Admission medications   Medication Sig Start Date End Date Taking? Authorizing Provider  albuterol (PROVENTIL HFA;VENTOLIN HFA) 108 (90 Base) MCG/ACT inhaler Inhale 2 puffs into  the lungs every 4 (four) hours as needed for wheezing or shortness of breath. 01/03/18   Lauraine Rinne, NP  albuterol (PROVENTIL) (2.5 MG/3ML) 0.083% nebulizer solution Take 3 mLs (2.5 mg total) by nebulization every 6 (six) hours as needed for wheezing or shortness of breath. 01/03/18   Lauraine Rinne, NP  cholecalciferol (VITAMIN D) 1000 UNITS tablet Take 1,000 Units by mouth daily.     [provider]  diclofenac sodium (VOLTAREN) 1 % GEL Apply 4 g topically 4 (four) times daily. 08/28/18   Deno Etienne, DO  Fluticasone-Umeclidin-Vilant (TRELEGY ELLIPTA) 100-62.5-25 MCG/INH AEPB Inhale 1 puff into the lungs daily. Patient not taking: Reported on  02/02/2018 11/14/17   [name], MD  Fluticasone-Umeclidin-Vilant (TRELEGY ELLIPTA) 100-62.5-25 MCG/INH AEPB Inhale 1 puff into the lungs daily. 02/02/18   [name], MD  losartan (COZAAR) 50 MG tablet Take 50 mg by mouth daily with breakfast.  12/26/11   [provider]  Naproxen Sodium (ALEVE) 220 MG CAPS Take 440 mg by mouth daily as needed (for pain). For pain    [provider]  simvastatin (ZOCOR) 40 MG tablet Take 40 mg by mouth at bedtime.  12/27/11   [provider]  vitamin B-12 (CYANOCOBALAMIN) 100 MCG tablet Take 100 mcg by mouth daily.    [provider]    Family History Family History  Problem Relation Age of Onset  . Lung cancer Father   . Lung cancer Mother   . Diabetes Brother     Social History Social History   Tobacco Use  . Smoking status: Former Smoker    Packs/day: 1.00    Years: 60.00    Pack years: 60.00    Types: Cigarettes    Start date: 08/09/1955  . Smokeless tobacco: Never Used  . Tobacco comment: reports he stopped at the beginning of this year  Substance Use Topics  . Alcohol use: No    Alcohol/week: 0.0 standard drinks  . Drug use: No     Allergies   Adhesive [tape]   Review of Systems Review of Systems  Constitutional: Negative for chills and fever.  HENT: Negative for congestion and facial swelling.   Eyes: Negative for discharge and visual disturbance.  Respiratory: Negative for shortness of breath.   Cardiovascular: Negative for chest pain and palpitations.  Gastrointestinal: Negative for abdominal pain, diarrhea and vomiting.  Musculoskeletal: Positive for arthralgias and gait problem. Negative for myalgias.  Skin: Negative for color change and rash.  Neurological: Negative for tremors, syncope and headaches.  Psychiatric/Behavioral: Negative for confusion and dysphoric mood.     Physical Exam Updated Vital Signs BP (!) 155/86 (BP Location: Right Arm)   Pulse 84   Temp 97.8 F (36.6  C) (Oral)   Resp 16   SpO2 94%   Physical Exam Vitals signs and nursing note reviewed.  Constitutional:      Appearance: He is well-developed.  HENT:     Head: Normocephalic and atraumatic.  Eyes:     Pupils: Pupils are equal, round, and reactive to light.  Neck:     Musculoskeletal: Normal range of motion and neck supple.     Vascular: No JVD.  Cardiovascular:     Rate and Rhythm: Normal rate and regular rhythm.     Heart sounds: No murmur. No friction rub. No gallop.   Pulmonary:     Effort: No respiratory distress.     Breath sounds: No wheezing.  Abdominal:  General: There is no distension.     Tenderness: There is no guarding or rebound.  Musculoskeletal: Normal range of motion.     Comments: Pulse motor and sensation is intact distally.  The patient has significant tenderness when I try to internally rotate the hip.  No masses or bulges.  No right lower quadrant abdominal tenderness.  No testicular masses or bulges.  Skin:    Coloration: Skin is not pale.     Findings: No rash.  Neurological:     Mental Status: He is alert and oriented to person, place, and time.  Psychiatric:        Behavior: Behavior normal.      ED Treatments / Results  Labs (all labs ordered are listed, but only abnormal results are displayed) Labs Reviewed - No data to display  EKG None  Radiology Dg Hip Unilat W Or Wo Pelvis 2-3 Views Right  Result Date: 08/28/2018 CLINICAL DATA:  Right lateral hip pain for 3 weeks. EXAM: DG HIP (WITH OR WITHOUT PELVIS) 2-3V RIGHT COMPARISON:  None. FINDINGS: There is no evidence of hip fracture or dislocation. Mild symmetric narrowing of the hip joint spaces bilaterally, with mild subchondral sclerosis and chondrocalcinosis. IMPRESSION: Bilateral hip arthropathy, with presence of chondrocalcinosis, usually associated with CPPD arthropathy. Electronically Signed   By: Fidela Salisbury M.D.   On: 08/28/2018 13:42    Procedures Procedures  (including critical care time)  Medications Ordered in ED Medications  diclofenac sodium (VOLTAREN) 1 % transdermal gel 4 g (4 g Topical Given 08/28/18 1343)     Initial Impression / Assessment and Plan / ED Course  I have reviewed the triage vital signs and the nursing notes.  Pertinent labs & imaging results that were available during my care of the patient were reviewed by me and considered in my medical decision making (see chart for details).     78 yo M with a chief complaint of right groin pain.  Exam without focal finding.  Patient does seem to have pain with internal rotation of that hip.  History is most consistent with chronic arthritis of the hips though patient initially did not endorse chronic symptoms though later did say that has been hurting him off and on for some time.  Think this is most likely arthritis.  Plain film with no fracture is viewed by me.  He was given Tylenol and Voltaren gel with significant improvement of his symptoms.  Now is walking actively in the room without tenderness.  Will write him a prescription for Voltaren gel.  Given follow-up with orthopedics.  PCP follow-up as well.  3:07 PM:  I have discussed the diagnosis/risks/treatment options with the patient and believe the pt to be eligible for discharge home to follow-up with PCP, Ortho. We also discussed returning to the ED immediately if new or worsening sx occur. We discussed the sx which are most concerning (e.g., sudden worsening pain, fever, inability to tolerate by mouth) that necessitate immediate return. Medications administered to the patient during their visit and any new prescriptions provided to the patient are listed below.  Medications given during this visit Medications  diclofenac sodium (VOLTAREN) 1 % transdermal gel 4 g (4 g Topical Given 08/28/18 1343)     The patient appears reasonably screen and/or stabilized for discharge and I doubt any other medical condition or other [facility]  requiring further screening, evaluation, or treatment in the ED at this time prior to discharge.    Final Clinical Impressions(s) /  ED Diagnoses   Final diagnoses:  Right hip pain    ED Discharge Orders         Ordered    diclofenac sodium (VOLTAREN) 1 % GEL  4 times daily     08/28/18 [place], Ules Marsala, DO 08/28/18 1507

## 2018-09-07 DIAGNOSIS — J441 Chronic obstructive pulmonary disease with (acute) exacerbation: Secondary | ICD-10-CM | POA: Diagnosis not present

## 2018-09-10 ENCOUNTER — Telehealth: Payer: Self-pay | Admitting: Pulmonary Disease

## 2018-09-10 NOTE — Telephone Encounter (Signed)
Attempted to contact [facility] and was placed on hold for >14min. Will call back.

## 2018-09-11 DIAGNOSIS — Z8546 Personal history of malignant neoplasm of prostate: Secondary | ICD-10-CM | POA: Diagnosis not present

## 2018-09-11 DIAGNOSIS — R319 Hematuria, unspecified: Secondary | ICD-10-CM | POA: Diagnosis not present

## 2018-09-11 NOTE — Telephone Encounter (Signed)
Called and spoke with receptionist. She stated that Jeanett Schlein was in the warehouse and she would have her give [place] a call back.

## 2018-09-13 NOTE — Telephone Encounter (Signed)
Called and spoke to [place], who is requesting that last OV note be faxed to [phone]. Office notes have been faxed to provided fax number.  Received fax confirmation.  Nothing further is needed.

## 2018-09-26 DIAGNOSIS — R3129 Other microscopic hematuria: Secondary | ICD-10-CM | POA: Diagnosis not present

## 2018-09-26 DIAGNOSIS — E7800 Pure hypercholesterolemia, unspecified: Secondary | ICD-10-CM | POA: Diagnosis not present

## 2018-09-26 DIAGNOSIS — I10 Essential (primary) hypertension: Secondary | ICD-10-CM | POA: Diagnosis not present

## 2018-10-01 DIAGNOSIS — Z0000 Encounter for general adult medical examination without abnormal findings: Secondary | ICD-10-CM | POA: Diagnosis not present

## 2018-10-01 DIAGNOSIS — E7800 Pure hypercholesterolemia, unspecified: Secondary | ICD-10-CM | POA: Diagnosis not present

## 2018-10-01 DIAGNOSIS — J449 Chronic obstructive pulmonary disease, unspecified: Secondary | ICD-10-CM | POA: Diagnosis not present

## 2018-10-06 DIAGNOSIS — J441 Chronic obstructive pulmonary disease with (acute) exacerbation: Secondary | ICD-10-CM | POA: Diagnosis not present

## 2018-11-06 DIAGNOSIS — J441 Chronic obstructive pulmonary disease with (acute) exacerbation: Secondary | ICD-10-CM | POA: Diagnosis not present

## 2018-12-06 DIAGNOSIS — J441 Chronic obstructive pulmonary disease with (acute) exacerbation: Secondary | ICD-10-CM | POA: Diagnosis not present

## 2018-12-12 ENCOUNTER — Emergency Department (HOSPITAL_COMMUNITY)
Admission: EM | Admit: 2018-12-12 | Discharge: 2018-12-12 | Disposition: A | Discharge status: Home / Self Care | Payer: Medicare HMO | Attending: Emergency Medicine | Admitting: Emergency Medicine

## 2018-12-12 ENCOUNTER — Emergency Department (HOSPITAL_COMMUNITY): Payer: Medicare HMO

## 2018-12-12 ENCOUNTER — Other Ambulatory Visit: Payer: Self-pay

## 2018-12-12 ENCOUNTER — Encounter (HOSPITAL_COMMUNITY): Payer: Self-pay | Admitting: *Deleted

## 2018-12-12 DIAGNOSIS — Y999 Unspecified external cause status: Secondary | ICD-10-CM | POA: Diagnosis not present

## 2018-12-12 DIAGNOSIS — X58XXXA Exposure to other specified factors, initial encounter: Secondary | ICD-10-CM | POA: Diagnosis not present

## 2018-12-12 DIAGNOSIS — R0602 Shortness of breath: Secondary | ICD-10-CM | POA: Diagnosis not present

## 2018-12-12 DIAGNOSIS — Y929 Unspecified place or not applicable: Secondary | ICD-10-CM | POA: Diagnosis not present

## 2018-12-12 DIAGNOSIS — S161XXA Strain of muscle, fascia and tendon at neck level, initial encounter: Secondary | ICD-10-CM

## 2018-12-12 DIAGNOSIS — E119 Type 2 diabetes mellitus without complications: Secondary | ICD-10-CM | POA: Insufficient documentation

## 2018-12-12 DIAGNOSIS — M542 Cervicalgia: Secondary | ICD-10-CM | POA: Diagnosis present

## 2018-12-12 DIAGNOSIS — J441 Chronic obstructive pulmonary disease with (acute) exacerbation: Secondary | ICD-10-CM | POA: Insufficient documentation

## 2018-12-12 DIAGNOSIS — Z87891 Personal history of nicotine dependence: Secondary | ICD-10-CM | POA: Insufficient documentation

## 2018-12-12 DIAGNOSIS — I10 Essential (primary) hypertension: Secondary | ICD-10-CM | POA: Insufficient documentation

## 2018-12-12 DIAGNOSIS — G8929 Other chronic pain: Secondary | ICD-10-CM | POA: Insufficient documentation

## 2018-12-12 DIAGNOSIS — Y939 Activity, unspecified: Secondary | ICD-10-CM | POA: Insufficient documentation

## 2018-12-12 MED ORDER — CYCLOBENZAPRINE HCL 10 MG PO TABS: 10.0000 mg | ORAL_TABLET | Freq: Two times a day (BID) | ORAL | 0 refills | Status: DC | PRN

## 2018-12-12 MED ORDER — CYCLOBENZAPRINE HCL 10 MG PO TABS
5.0000 mg | ORAL_TABLET | Freq: Once | ORAL | Status: AC
  Administered 2018-12-12: 5 mg via ORAL
  Filled 2018-12-12: qty 1

## 2018-12-12 MED ORDER — PREDNISONE 20 MG PO TABS: 40.0000 mg | ORAL_TABLET | Freq: Every day | ORAL | 0 refills | Status: AC

## 2018-12-12 NOTE — Discharge Instructions (Addendum)
You were evaluated in the Emergency Department and after careful evaluation, we did not find any emergent condition requiring admission or further testing in the hospital.  Your symptoms today seem to be due to muscle strain or spasm in the neck.  You also seem to be having a mild flare of your COPD.  Your chest x-ray was reassuring with no evidence of pneumonia.  Please take the prednisone medication as directed for the next 5 days.  As discussed, the muscle relaxers provided can make you sleepy, please restrict their use to at night if you are having trouble sleeping due to the pain.  Please return to the Emergency Department if you experience any worsening of your condition.  We encourage you to follow up with a primary care provider.  Thank you for allowing [place] to be a part of your care.

## 2018-12-12 NOTE — Progress Notes (Signed)
@Patient  ID: Cory Kemp, male    DOB: January 29, 1941, 78 y.o.   MRN: [id number]  Chief Complaint  Patient presents with  . Hospitalization Follow-up    copd    Referring provider: Jani Gravel, MD  HPI:  78 year old former smoker followed in our office for COPD and OSA   PMH: DMT2, HTN  Smoker/ Smoking History: Former Smoker  Maintenance:  Trelegy Ellipta  Pt of: Dr. Vaughan Browner  12/13/2018  - Visit   78 year old male former smoker (quit 4 months ago) followed in our office for COPD Gold 3 and chronic respiratory failure.  Patient was recently seen in the emergency room yesterday (12/12/2018) for COPD exacerbation as well as a muscle strain.  Patient was discharged home on 5 days of 40 mg of prednisone.  Patient was also given a Flexeril prescription.  Patient was initially scheduled to follow-up with our office today on 12/13/2018 due to the fact that he was having oxygen saturations in the 80s at home.  Unfortunately this is when the patient takes his oxygen off and tries to walk around his house without oxygen on.  He reports that when he is wearing oxygen at rest his oxygen saturations are around 97%.  When he takes the oxygen off and then ambulates to go to the bathroom or walk to the kitchen his oxygen saturations are around 85% and sometimes lower.  [place] on [employer], operational.  Patient has a portable oxygen concentrator that is 78 years old and broken.  Per the patient the DME company cannot fix this due to they no longer make the part that he needs.  He has no portable oxygen options at this time at home.  Patient also recently ran out of Trelegy Ellipta 4 days ago.  He reports he had Anoro Ellipta at his house so he started using that in place of the Trelegy Ellipta.  DME company is [employer].   Patient presents today with his daughter who has significant concerns regarding her father's breathing and that it just seems that it is worsening.  MMRC  - Breathlessness Score 3 - I stop for breath after walking about 100 yards or after a few minutes on level ground (isle at grocery store is 141ft)    Tests:   CT chest 09/12/14- Emphysematous changes noted bilaterally. Minimal posterior basilar scarring or subsegmental atelectasis is noted. No pulmonary mass or nodule is noted  CT angiogram 04/10/16- emphysema, chronic bronchitis.  Subsegmental atelectasis at the bases mildly increased compared to previous  Chest x-ray 07/02/17-hyperinflated lungs.  No acute abnormality.  PFTs 01/21/16 FVC 2.44 (71%), FEV1 1.47 [41%), F/F 43, TLC 122%, RV/TLC 166%, DLCO 48% Severe obstruction with reduction in diffusion capacity. Hyperinflation with air trapping.  Home sleep test 11/14/16 Moderate OSA with desats.  In lab sleep study 12/16/2017 AHI 0.8, nocturnal desaturations.  FENO:  No results found for: NITRICOXIDE  PFT: PFT Results Latest Ref Rng & Units 01/21/2016  FVC-Pre L 3.44  FVC-Predicted Pre % 71  FVC-Post L 3.65  FVC-Predicted Post % 75  Pre FEV1/FVC % % 43  Post FEV1/FCV % % 45  FEV1-Pre L 1.47  FEV1-Predicted Pre % 41  FEV1-Post L 1.66  DLCO UNC% % 48  DLCO COR %Predicted % 64  TLC L 9.57  TLC % Predicted % 123  RV % Predicted % 215    Imaging: Dg Chest 2 View  Result Date: 12/12/2018 CLINICAL  DATA:  Neck pain. Shortness of breath. EXAM: CHEST - 2 VIEW COMPARISON:  01/03/2018. FINDINGS: Heart size is increased. Calcified tortuous aorta. Hyperinflation. RIGHT infrahilar scarring, similar to priors. No definite consolidation or edema. No effusion or pneumothorax. Bones are unremarkable. IMPRESSION: Cardiomegaly.  COPD.  No active disease. Electronically Signed   By: Staci Righter M.D.   On: 12/12/2018 13:05      Specialty Problems      Pulmonary Problems   COPD (chronic obstructive pulmonary disease) (HCC)    PFTs 01/21/16 FVC 2.44 (71%), FEV1 1.47 [41%), F/F 43, TLC 122%, RV/TLC 166%, DLCO 48% Severe obstruction with  reduction in diffusion capacity. Hyperinflation with air trapping.  CT chest 09/12/14- Emphysematous changes noted bilaterally. Minimal posterior basilar scarring or subsegmental atelectasis is noted. No pulmonary mass or nodule is noted       OSA (obstructive sleep apnea)   Chronic respiratory failure with hypoxia (HCC)      Allergies  Allergen Reactions  . Adhesive [Tape] Rash    Immunization History  Administered Date(s) Administered  . Influenza, High Dose Seasonal PF 05/21/2018  . Pneumococcal Conjugate-13 02/02/2018   Needs pneumovax23 at next ov  Past Medical History:  Diagnosis Date  . Alcohol abuse   . Anemia   . Arthritis   . Asthma   . B12 deficiency 11/03/2013  . BPH (benign prostatic hypertrophy)   . COPD (chronic obstructive pulmonary disease) ([place])   . Dementia ([place])    ?  [name] Kitchen Diabetes mellitus ([place]) 01/10/2012  . Femoral hernia, right 03/27/2012  . Glucose intolerance (impaired glucose tolerance)   . Hyperlipidemia   . Hypertension   . Incisional hernias - swiss-cheese-type periumbilical 04/11/8545  . Inguinal hernia - right 01/10/2012  . Leukopenia 01/29/2013  . Monocytosis 01/29/2013  . Normochromic anemia 01/29/2013  . Osteopenia     Tobacco History: Social History   Tobacco Use  Smoking Status Former Smoker  . Packs/day: 1.00  . Years: 63.00  . Pack years: 63.00  . Types: Cigarettes  . Start date: 08/09/1955  . Last attempt to quit: 08/08/2018  . Years since quitting: 0.3  Smokeless Tobacco Never Used  Tobacco Comment   reports he stopped at the beginning of this year   Counseling given: Yes Comment: reports he stopped at the beginning of this year  Continue to not smoke  Outpatient Encounter Medications as of 12/13/2018  Medication Sig  . albuterol (PROVENTIL) (2.5 MG/3ML) 0.083% nebulizer solution Take 3 mLs (2.5 mg total) by nebulization every 6 (six) hours as needed for wheezing or shortness of breath.  [name] Kitchen albuterol (VENTOLIN HFA) 108 (90 Base)  MCG/ACT inhaler Inhale 2 puffs into the lungs every 4 (four) hours as needed for wheezing or shortness of breath.  . cholecalciferol (VITAMIN D) 1000 UNITS tablet Take 1,000 Units by mouth daily.   . cyclobenzaprine (FLEXERIL) 10 MG tablet Take 1 tablet (10 mg total) by mouth 2 (two) times daily as needed for muscle spasms.  . diclofenac sodium (VOLTAREN) 1 % GEL Apply 4 g topically 4 (four) times daily.  . Fluticasone-Umeclidin-Vilant (TRELEGY ELLIPTA) 100-62.5-25 MCG/INH AEPB Inhale 1 puff into the lungs daily.  . Fluticasone-Umeclidin-Vilant (TRELEGY ELLIPTA) 100-62.5-25 MCG/INH AEPB Inhale 1 puff into the lungs daily.  [name] Kitchen losartan (COZAAR) 50 MG tablet Take 50 mg by mouth daily with breakfast.   . Naproxen Sodium (ALEVE) 220 MG CAPS Take 440 mg by mouth daily as needed (for pain). For pain  . predniSONE (DELTASONE) 20 MG  tablet Take 2 tablets (40 mg total) by mouth daily for 5 days.  . simvastatin (ZOCOR) 40 MG tablet Take 40 mg by mouth at bedtime.   . vitamin B-12 (CYANOCOBALAMIN) 100 MCG tablet Take 100 mcg by mouth daily.  . [DISCONTINUED] albuterol (PROVENTIL HFA;VENTOLIN HFA) 108 (90 Base) MCG/ACT inhaler Inhale 2 puffs into the lungs every 4 (four) hours as needed for wheezing or shortness of breath.  [name] Kitchen azithromycin (ZITHROMAX) 250 MG tablet 500mg  (two tablets) today, then 250mg  (1 tablet) for the next 4 days  . Fluticasone-Umeclidin-Vilant (TRELEGY ELLIPTA) 100-62.5-25 MCG/INH AEPB Inhale 1 puff into the lungs daily.  . predniSONE (DELTASONE) 10 MG tablet 4 tabs for 2 days, then 3 tabs for 2 days, 2 tabs for 2 days, then 1 tab for 2 days, then stop   No facility-administered encounter medications on file as of 12/13/2018.      Review of Systems  Review of Systems  Constitutional: Positive for fatigue. Negative for activity change, chills, fever and unexpected weight change.  HENT: Negative for postnasal drip, rhinorrhea, sinus pressure, sinus pain, sneezing and sore throat.   Eyes:  Negative.   Respiratory: Positive for cough, shortness of breath and wheezing.   Cardiovascular: Negative for chest pain and palpitations.  Gastrointestinal: Negative for constipation, diarrhea, nausea and vomiting.  Endocrine: Negative.   Musculoskeletal: Negative.   Skin: Negative.   Neurological: Negative for dizziness and headaches.  Psychiatric/Behavioral: Negative.  Negative for dysphoric mood. The patient is not nervous/anxious.   All other systems reviewed and are negative.    Physical Exam  BP 120/64 (BP Location: Right Arm, Cuff Size: Normal)   Pulse (!) 112   Temp 98.2 F (36.8 C) (Oral)   Ht 6\' 1"  (1.854 m)   Wt 212 lb 6.4 oz (96.3 kg)   SpO2 93%   BMI 28.02 kg/m   Wt Readings from Last 5 Encounters:  12/13/18 212 lb 6.4 oz (96.3 kg)  02/02/18 188 lb (85.3 kg)  01/03/18 195 lb 6.4 oz (88.6 kg)  12/16/17 190 lb (86.2 kg)  11/14/17 194 lb 9.6 oz (88.3 kg)   Physical Exam  Constitutional: He is oriented to person, place, and time and well-developed, well-nourished, and in no distress. No distress.  Chronically ill elderly male  HENT:  Head: Normocephalic and atraumatic.  Right Ear: Hearing, tympanic membrane, external ear and ear canal normal.  Left Ear: Hearing, tympanic membrane, external ear and ear canal normal.  Nose: Nose normal. Right sinus exhibits no maxillary sinus tenderness and no frontal sinus tenderness. Left sinus exhibits no maxillary sinus tenderness and no frontal sinus tenderness.  Mouth/Throat: Uvula is midline and oropharynx is clear and moist. No oropharyngeal exudate.  Eyes: Pupils are equal, round, and reactive to light.  Neck: Normal range of motion. Neck supple.  Cardiovascular: Normal rate, regular rhythm and normal heart sounds.  Pulmonary/Chest: Effort normal. No accessory muscle usage. No respiratory distress. He has no decreased breath sounds. He has wheezes (Expiratory wheeze). He has no rhonchi.  Abdominal: Soft. Bowel sounds are  normal. He exhibits no distension. There is no abdominal tenderness.  Musculoskeletal: Normal range of motion.        General: No edema.  Lymphadenopathy:    He has no cervical adenopathy.  Neurological: He is alert and oriented to person, place, and time. Gait normal.  Skin: Skin is warm and dry. He is not diaphoretic. No erythema.  Psychiatric: Mood, memory, affect and judgment normal.  Nursing note and  vitals reviewed.    SIX MIN WALK 12/13/2018 01/03/2018 10/03/2016  Supplimental Oxygen during Test? (L/min) Yes Yes No  O2 Flow Rate 2 2 -  Type Continuous Pulse -  Tech Comments: Patient completed a 1/2 lap before his desat. He was placed on 2L of O2. O2 recovered to 90%. He was able to complete the rest of the lap and his O2 increased to 92% on 2L.  steady wa;l but pt in ditress and gasping for air while on oxygen. Oxygen levels were above 88% but pt still in distress stating it hurt for him to breathe. I advised pt he needed to be evaluated for tachycardia and severe SOB. Pt agreed and is scheduled to see Wyn Quaker, NP TA/CMA Pt. was able to complete all 3 laps without stopping. Denied any SOB or chest pain.      Lab Results:  CBC    Component Value Date/Time   WBC 4.2 07/02/2017 1036   RBC 4.59 07/02/2017 1036   HGB 13.5 07/02/2017 1036   HGB 13.3 06/25/2013 0822   HCT 42.1 07/02/2017 1036   HCT 39.8 06/25/2013 0822   PLT 158 07/02/2017 1036   PLT 165 06/25/2013 0822   MCV 91.7 07/02/2017 1036   MCV 89.4 06/25/2013 0822   MCH 29.4 07/02/2017 1036   MCHC 32.1 07/02/2017 1036   RDW 14.5 07/02/2017 1036   RDW 14.3 06/25/2013 0822   LYMPHSABS 2.3 07/02/2017 1036   LYMPHSABS 1.3 06/25/2013 0822   MONOABS 0.5 07/02/2017 1036   MONOABS 0.6 06/25/2013 0822   EOSABS 0.0 07/02/2017 1036   EOSABS 0.0 06/25/2013 0822   BASOSABS 0.0 07/02/2017 1036   BASOSABS 0.0 06/25/2013 0822    BMET    Component Value Date/Time   NA 139 07/02/2017 1036   NA 140 02/20/2013 1148   K 5.1  07/02/2017 1036   K 4.1 02/20/2013 1148   CL 105 07/02/2017 1036   CO2 27 07/02/2017 1036   CO2 26 02/20/2013 1148   GLUCOSE 115 (H) 07/02/2017 1036   GLUCOSE 89 02/20/2013 1148   BUN 18 07/02/2017 1036   BUN 12.8 02/20/2013 1148   CREATININE 0.90 07/02/2017 1036   CREATININE 0.8 02/20/2013 1148   CALCIUM 8.4 (L) 07/02/2017 1036   CALCIUM 9.2 02/20/2013 1148   GFRNONAA >60 07/02/2017 1036   GFRAA >60 07/02/2017 1036    BNP    Component Value Date/Time   BNP 106.9 (H) 04/10/2016 1838    ProBNP No results found for: PROBNP    Assessment & Plan:    Tobacco abuse Assessment: Stop smoking 4 months ago Former smoker 63-pack-year smoking history  Plan: Continue to not smoke  COPD (chronic obstructive pulmonary disease) ([place]) Assessment: COPD stage III on 2017 pulmonary function testing 2017 DLCO 48 MMRC 3  Currently taking Anoro Ellipta because he ran out of Trelegy Ellipta Questionable adherence to oxygen 12/12/2018-ER visit for COPD exacerbation and muscle strain Expiratory wheeze on exam today currently taking 40 mg of prednisone from ER  Plan: Prednisone taper today Azithromycin today Resume Trelegy Ellipta, samples provided today Stop Anoro Ellipta Saba rescue inhaler or nebulized medication every 6 hours as needed for shortness of breath or wheezing New order for nebulizer placed Walk in office today Wear 2 L of O2 at all times Continue to not smoke Continue to be active and ambulate often Follow-up with our office in 2 to 4 weeks   Chronic respiratory failure with hypoxia ([place]) Assessment: Patient reporting that when  he ambulates at home his oxygen drops to mid 80s sometimes lower 2017 DLCO 48 2017 CT shows emphysema  Plan: Walk today in office reveals patient needs 2 L via nasal cannula Discharge from office today with [employer] tank Order sent to [employer] for portable oxygen tanks Informed patient that he must wear oxygen whenever he is physically  exerting himself or up moving around Order sent [facility] to ensure he has long enough oxygen tubing throughout his house Follow-up with our office in 2 to 4 weeks     Lauraine Rinne, NP 12/13/2018   This appointment was 46 min long with over 50% of the time in direct face-to-face patient care, assessment, plan of care, and follow-up.

## 2018-12-12 NOTE — ED Notes (Signed)
Per MD- O2 between 90-93% ok without O2.

## 2018-12-12 NOTE — ED Provider Notes (Signed)
**Note Cory-Identified via Obfuscation** [facility] Emergency Department Provider Note MRN:  [id number]  Arrival date & time: 12/12/18     Chief Complaint   Neck Pain and Shortness of Breath   History of Present Illness   Cory Kemp is a 78 y.o. year-old male with a history of COPD, diabetes presenting to the ED with chief complaint of neck pain.  Patient has had chronic neck pain for several years.  Explains that his neck became more painful 2 or 3 days ago.  Woke up with stiffness, tenderness to the right side of the neck.  Pain with attempted motion of the neck.  Denies fever, no trauma, no numbness or weakness to the arms or legs, no bowel or bladder dysfunction.  Endorsing his baseline shortness of breath related to his COPD.  Does endorse increased cough with mucus for the past few days.  Denies chest pain, no abdominal pain.  Symptoms are moderate, constant, no other exacerbating relieving factors.  Review of Systems  A complete 10 system review of systems was obtained and all systems are negative except as noted in the HPI and PMH.   Patient's Health History    Past Medical History:  Diagnosis Date  . Alcohol abuse   . Anemia   . Arthritis   . Asthma   . B12 deficiency 11/03/2013  . BPH (benign prostatic hypertrophy)   . COPD (chronic obstructive pulmonary disease) ([place])   . Dementia ([place])    ?  [name] Kitchen Diabetes mellitus ([place]) 01/10/2012  . Femoral hernia, right 03/27/2012  . Glucose intolerance (impaired glucose tolerance)   . Hyperlipidemia   . Hypertension   . Incisional hernias - swiss-cheese-type periumbilical 08/09/4578  . Inguinal hernia - right 01/10/2012  . Leukopenia 01/29/2013  . Monocytosis 01/29/2013  . Normochromic anemia 01/29/2013  . Osteopenia     Past Surgical History:  Procedure Laterality Date  . HEMORRHOID SURGERY    . HERNIA REPAIR    . INGUINAL HERNIA REPAIR  02/24/2012   Procedure: LAPAROSCOPIC INGUINAL HERNIA;  Surgeon: Adin Hector, MD;  Location: [facility];  Service:  General;  Laterality: N/A;  . TUMOR EXCISION     intestinal after SBO ( Benign), appendectomy  . VENTRAL HERNIA REPAIR  02/24/2012   Procedure: LAPAROSCOPIC VENTRAL HERNIA;  Surgeon: Adin Hector, MD;  Location: [facility];  Service: General;  Laterality: N/A;  lysis of adhesions, excision of abdominal wall lipomae    Family History  Problem Relation Age of Onset  . Lung cancer Father   . Lung cancer Mother   . Diabetes Brother     Social History   Socioeconomic History  . Marital status: Single    Spouse name: Not on file  . Number of children: 2  . Years of education: Not on file  . Highest education level: Not on file  Occupational History  . Occupation: Retired    Comment: International aid/development worker  . Financial resource strain: Not on file  . Food insecurity:    Worry: Not on file    Inability: Not on file  . Transportation needs:    Medical: Not on file    Non-medical: Not on file  Tobacco Use  . Smoking status: Former Smoker    Packs/day: 1.00    Years: 60.00    Pack years: 60.00    Types: Cigarettes    Start date: 08/09/1955  . Smokeless tobacco: Never Used  . Tobacco comment: reports he stopped at the  beginning of this year  Substance and Sexual Activity  . Alcohol use: No    Alcohol/week: 0.0 standard drinks  . Drug use: No  . Sexual activity: Not on file  Lifestyle  . Physical activity:    Days per week: Not on file    Minutes per session: Not on file  . Stress: Not on file  Relationships  . Social connections:    Talks on phone: Not on file    Gets together: Not on file    Attends religious service: Not on file    Active member of club or organization: Not on file    Attends meetings of clubs or organizations: Not on file    Relationship status: Not on file  . Intimate partner violence:    Fear of current or ex partner: Not on file    Emotionally abused: Not on file    Physically abused: Not on file    Forced sexual activity: Not on file  Other  Topics Concern  . Not on file  Social History Narrative  . Not on file     Physical Exam  Vital Signs and Nursing Notes reviewed Vitals:   12/12/18 1235 12/12/18 1243  BP: (!) 145/72 (!) 147/83  Pulse: (!) 110 (!) 106  Resp: (!) 26 20  Temp: 97.8 F (36.6 C)   SpO2: (!) 86% 90%    CONSTITUTIONAL: Well-appearing, NAD NEURO:  Alert and oriented x 3, no focal deficits EYES:  eyes equal and reactive ENT/NECK:  no LAD, no JVD CARDIO: Regular rate, well-perfused, normal S1 and S2 PULM:  Faint scattered wheezes GI/GU:  normal bowel sounds, non-distended, non-tender MSK/SPINE:  No gross deformities, no edema SKIN:  no rash, atraumatic PSYCH:  Appropriate speech and behavior  Diagnostic and Interventional Summary    Labs Reviewed - No data to display  DG Chest 2 View  Final Result      Medications  cyclobenzaprine (FLEXERIL) tablet 5 mg (5 mg Oral Given 12/12/18 1304)     Procedures Critical Care  ED Course and Medical Decision Making  I have reviewed the triage vital signs and the nursing notes.  Pertinent labs & imaging results that were available during my care of the patient were reviewed by me and considered in my medical decision making (see below for details).  Reproducible right trapezius pain in this 78 year old male with a history of COPD.  Consistent with MSK strain or spasm.  Also with some increased cough for the past few days, triggering chest x-ray which does not show any abnormalities, no pneumonia.  Patient does have scattered wheezes.  Initial vital signs were obtained during my H&P questioning; when he is forced to talk frequently he was mildly tachycardic with a pulse ox around 90%, likely his baseline oxygen saturation.  On my repeat evaluation, while at rest, he is satting 95% on room air with a heart rate in the 80s to 90s.  Patient has no signs or symptoms of myelopathy, there is no indication for laboratory testing at this time.  Favoring MSK strain or  spasm as well as a possible mild COPD exacerbation, both of which may improve with steroid burst, will also provide muscle relaxers to be used sparingly and as needed at night if he is having trouble sleeping.  Strict return precautions for shortness of breath.  Has PCP follow-up tomorrow.  After the discussed management above, the patient was determined to be safe for discharge.  The patient was in  agreement with this plan and all questions regarding their care were answered.  ED return precautions were discussed and the patient will return to the ED with any significant worsening of condition.  Barth Kirks. Sedonia Small, MD [place] [email or web address]  Final Clinical Impressions(s) / ED Diagnoses     ICD-10-CM   1. COPD exacerbation ([place]) J44.1   2. Strain of neck muscle, initial encounter S16.1XXA     ED Discharge Orders         Ordered    predniSONE (DELTASONE) 20 MG tablet  Daily     12/12/18 1401    cyclobenzaprine (FLEXERIL) 10 MG tablet  2 times daily PRN     12/12/18 1401             Maudie Flakes, MD 12/12/18 1402

## 2018-12-12 NOTE — ED Notes (Signed)
Pt placed on 2L [place] for sat at 86%. spO2 at 93%

## 2018-12-12 NOTE — ED Triage Notes (Addendum)
Pt in c/o neck pain for the last few days, history of same, took his pain pill about an hour ago with no relief, also c/o shortness of breath, history of COPD and has appointment tomorrow with pulmonologist, denies cough or fever, pt has also been off his home O2 for the last few hours, O2 86% on RA, no distress noted, placed back on O2

## 2018-12-12 NOTE — ED Notes (Signed)
ED Provider at bedside. 

## 2018-12-13 ENCOUNTER — Ambulatory Visit: Payer: Medicare HMO | Admitting: Pulmonary Disease

## 2018-12-13 ENCOUNTER — Other Ambulatory Visit: Payer: Self-pay

## 2018-12-13 ENCOUNTER — Encounter: Payer: Self-pay | Admitting: Pulmonary Disease

## 2018-12-13 VITALS — BP 120/64 | HR 112 | Temp 98.2°F | Ht 73.0 in | Wt 212.4 lb

## 2018-12-13 DIAGNOSIS — Z720 Tobacco use: Secondary | ICD-10-CM | POA: Diagnosis not present

## 2018-12-13 DIAGNOSIS — R0600 Dyspnea, unspecified: Secondary | ICD-10-CM

## 2018-12-13 DIAGNOSIS — J9611 Chronic respiratory failure with hypoxia: Secondary | ICD-10-CM

## 2018-12-13 DIAGNOSIS — J441 Chronic obstructive pulmonary disease with (acute) exacerbation: Secondary | ICD-10-CM

## 2018-12-13 MED ORDER — FLUTICASONE-UMECLIDIN-VILANT 100-62.5-25 MCG/INH IN AEPB: 1.0000 | INHALATION_SPRAY | Freq: Every day | RESPIRATORY_TRACT | 0 refills | Status: DC

## 2018-12-13 MED ORDER — AZITHROMYCIN 250 MG PO TABS: ORAL_TABLET | ORAL | 0 refills | Status: DC

## 2018-12-13 MED ORDER — ALBUTEROL SULFATE HFA 108 (90 BASE) MCG/ACT IN AERS: 2.0000 | INHALATION_SPRAY | RESPIRATORY_TRACT | 3 refills | Status: DC | PRN

## 2018-12-13 MED ORDER — PREDNISONE 10 MG PO TABS: ORAL_TABLET | ORAL | 0 refills | Status: DC

## 2018-12-13 NOTE — Assessment & Plan Note (Addendum)
Assessment: Stop smoking 4 months ago Former smoker 63-pack-year smoking history  Plan: Continue to not smoke

## 2018-12-13 NOTE — Assessment & Plan Note (Addendum)
Assessment: COPD stage III on 2017 pulmonary function testing 2017 DLCO 48 MMRC 3  Currently taking Anoro Ellipta because he ran out of Trelegy Ellipta Questionable adherence to oxygen 12/12/2018-ER visit for COPD exacerbation and muscle strain Expiratory wheeze on exam today currently taking 40 mg of prednisone from ER  Plan: Prednisone taper today Azithromycin today Resume Trelegy Ellipta, samples provided today Stop Anoro Ellipta Saba rescue inhaler or nebulized medication every 6 hours as needed for shortness of breath or wheezing New order for nebulizer placed Walk in office today Wear 2 L of O2 at all times Continue to not smoke Continue to be active and ambulate often Follow-up with our office in 2 to 4 weeks

## 2018-12-13 NOTE — Assessment & Plan Note (Signed)
Assessment: Patient reporting that when he ambulates at home his oxygen drops to mid 80s sometimes lower 2017 DLCO 48 2017 CT shows emphysema  Plan: Walk today in office reveals patient needs 2 L via nasal cannula Discharge from office today with [facility] tank Order sent to [facility] for portable oxygen tanks Informed patient that he must wear oxygen whenever he is physically exerting himself or up moving around Order sent [facility] to ensure he has long enough oxygen tubing throughout his house Follow-up with our office in 2 to 4 weeks

## 2018-12-13 NOTE — Patient Instructions (Addendum)
New neb order   Walk in office today   Complete prednisone from the ER, then start:   Prednisone 10mg  tablet  >>>4 tabs for 2 days, then 3 tabs for 2 days, 2 tabs for 2 days, then 1 tab for 2 days, then stop >>>take with food  >>>take in the morning    Trelegy Ellipta  >>> 1 puff daily in the morning >>>rinse mouth out after use  >>> This inhaler contains 3 medications that help manage her respiratory status, contact our office if you cannot afford this medication or unable to remain on this medication  Samples of Trelegy today  Refilled today   Only use your albuterol as a rescue medication to be used if you can't catch your breath by resting or doing a relaxed purse lip breathing pattern.  - The less you use it, the better it will work when you need it. - Ok to use up to 2 puffs  every 4 hours if you must but call for immediate appointment if use goes up over your usual need - Don't leave home without it !!  (think of it like the spare tire for your car)   Refilled today   Continue oxygen therapy as prescribed  >>>maintain oxygen saturations greater than 88 percent  >>>if unable to maintain oxygen saturations please contact the office  >>>do not smoke with oxygen  >>>can use nasal saline gel or nasal saline rinses to moisturize nose if oxygen causes dryness  Note your daily symptoms > remember "red flags" for COPD:   >>>Increase in cough >>>increase in sputum production >>>increase in shortness of breath or activity  intolerance.   If you notice these symptoms, please call the office to be seen.    Return in about 4 weeks (around 01/10/2019), or if symptoms worsen or fail to improve, for Follow up with Dr. Vaughan Browner, Follow up with Wyn Quaker FNP-C.   Coronavirus (COVID-19) Are you at risk?  Are you at risk for the Coronavirus (COVID-19)?  To be considered HIGH RISK for Coronavirus (COVID-19), you have to meet the following criteria:  . Traveled to [place], [place], [place], [place] or [place]; or in the [place] to [place], [place], [place], or [place]; and have fever, cough, and shortness of breath within the last 2 weeks of travel OR . Been in close contact with a person diagnosed with COVID-19 within the last 2 weeks and have fever, cough, and shortness of breath . IF YOU DO NOT MEET THESE CRITERIA, YOU ARE CONSIDERED LOW RISK FOR COVID-19.  What to do if you are HIGH RISK for COVID-19?  [name] Kitchen If you are having a medical emergency, call 911. . Seek medical care right away. Before you go to a doctor's office, urgent care or emergency department, call ahead and tell them about your recent travel, contact with someone diagnosed with COVID-19, and your symptoms. You should receive instructions from your physician's office regarding next steps of care.  . When you arrive at healthcare provider, tell the healthcare staff immediately you have returned from visiting [place], [place], [place], [place] or [place]; or traveled in the [place] to [place], [place], [place], or [place]; in the last two weeks or you have been in close contact with a person diagnosed with COVID-19 in the last 2 weeks.   . Tell the health care staff about your symptoms: fever, cough and shortness of breath. . After you have been seen by  a medical provider, you will be either: o Tested for (COVID-19) and discharged home on quarantine except to seek medical care if symptoms worsen, and asked to  - Stay home and avoid contact with others until you get your results (4-5 days)  - Avoid travel on public transportation if possible (such as bus, train, or airplane) or o Sent to the Emergency Department by EMS for evaluation, COVID-19 testing, and possible admission depending on your condition and test results.  What to do if you are LOW RISK for COVID-19?  Reduce your risk of any infection by using the same precautions used for avoiding the common cold or flu:  [name] Kitchen Wash your hands  often with soap and warm water for at least 20 seconds.  If soap and water are not readily available, use an alcohol-based hand sanitizer with at least 60% alcohol.  . If coughing or sneezing, cover your mouth and nose by coughing or sneezing into the elbow areas of your shirt or coat, into a tissue or into your sleeve (not your hands). . Avoid shaking hands with others and consider head nods or verbal greetings only. . Avoid touching your eyes, nose, or mouth with unwashed hands.  . Avoid close contact with people who are sick. . Avoid places or events with large numbers of people in one location, like concerts or sporting events. . Carefully consider travel plans you have or are making. . If you are planning any travel outside or inside the [place], visit the CDC's Travelers' Health webpage for the latest health notices. . If you have some symptoms but not all symptoms, continue to monitor at home and seek medical attention if your symptoms worsen. . If you are having a medical emergency, call 911.   [place] / e-Visit: [email or web address]         [facility]: [facility]: [phone]                   [facility]: [phone]           It is flu season:   >>> Best ways to protect herself from the flu: Receive the yearly flu vaccine, practice good hand hygiene washing with soap and also using hand sanitizer when available, eat a nutritious meals, get adequate rest, hydrate appropriately   Please contact the office if your symptoms worsen or you have concerns that you are not improving.   Thank you for choosing [facility] for your healthcare, and for allowing [place] to partner with you on your healthcare journey. I am thankful to be able to provide care to you today.   Wyn Quaker FNP-C       Chronic Obstructive  Pulmonary Disease Chronic obstructive pulmonary disease (COPD) is a long-term (chronic) lung problem. When you have COPD, it is hard for air to get in and out of your lungs. Usually the condition gets worse over time, and your lungs will never return to normal. There are things you can do to keep yourself as healthy as possible.  Your doctor may treat your condition with: ? Medicines. ? Oxygen. ? Lung surgery.  Your doctor may also recommend: ? Rehabilitation. This includes steps to make your body work better. It may involve a team of specialists. ? Quitting smoking, if you smoke. ? Exercise and changes to your diet. ? Comfort measures (palliative care). Follow these instructions at home: Medicines  Take over-the-counter and prescription medicines only as told by your doctor.  Talk to your doctor before taking any cough or allergy medicines. You may need to avoid medicines that cause your lungs to be dry. Lifestyle  If you smoke, stop. Smoking makes the problem worse. If you need help quitting, ask your doctor.  Avoid being around things that make your breathing worse. This may include smoke, chemicals, and fumes.  Stay active, but remember to rest as well.  Learn and use tips on how to relax.  Make sure you get enough sleep. Most adults need at least 7 hours of sleep every night.  Eat healthy foods. Eat smaller meals more often. Rest before meals. Controlled breathing Learn and use tips on how to control your breathing as told by your doctor. Try:  Breathing in (inhaling) through your nose for 1 second. Then, pucker your lips and breath out (exhale) through your lips for 2 seconds.  Putting one hand on your belly (abdomen). Breathe in slowly through your nose for 1 second. Your hand on your belly should move out. Pucker your lips and breathe out slowly through your lips. Your hand on your belly should move in as you breathe out.  Controlled coughing Learn and use controlled  coughing to clear mucus from your lungs. Follow these steps: 1. Lean your head a little forward. 2. Breathe in deeply. 3. Try to hold your breath for 3 seconds. 4. Keep your mouth slightly open while coughing 2 times. 5. Spit any mucus out into a tissue. 6. Rest and do the steps again 1 or 2 times as needed. General instructions  Make sure you get all the shots (vaccines) that your doctor recommends. Ask your doctor about a flu shot and a pneumonia shot.  Use oxygen therapy and pulmonary rehabilitation if told by your doctor. If you need home oxygen therapy, ask your doctor if you should buy a tool to measure your oxygen level (oximeter).  Make a COPD action plan with your doctor. This helps you to know what to do if you feel worse than usual.  Manage any other conditions you have as told by your doctor.  Avoid going outside when it is very hot, cold, or humid.  Avoid people who have a sickness you can catch (contagious).  Keep all follow-up visits as told by your doctor. This is important. Contact a doctor if:  You cough up more mucus than usual.  There is a change in the color or thickness of the mucus.  It is harder to breathe than usual.  Your breathing is faster than usual.  You have trouble sleeping.  You need to use your medicines more often than usual.  You have trouble doing your normal activities such as getting dressed or walking around the house. Get help right away if:  You have shortness of breath while resting.  You have shortness of breath that stops you from: ? Being able to talk. ? Doing normal activities.  Your chest hurts for longer than 5 minutes.  Your skin color is more blue than usual.  Your pulse oximeter shows that you have low oxygen for longer than 5 minutes.  You have a fever.  You feel too tired to breathe normally. Summary  Chronic obstructive pulmonary disease (COPD) is a long-term lung problem.  The way your lungs work will  never return to normal. Usually the condition gets worse over time. There are things you can do to keep yourself as  healthy as possible.  Take over-the-counter and prescription medicines only as told by your doctor.  If you smoke, stop. Smoking makes the problem worse. This information is not intended to replace advice given to you by your health care provider. Make sure you discuss any questions you have with your health care provider. Document Released: 01/11/2008 Document Revised: 08/29/2016 Document Reviewed: 08/29/2016 Elsevier Interactive Patient Education  2019 [employer].

## 2018-12-14 ENCOUNTER — Telehealth: Payer: Self-pay | Admitting: Pulmonary Disease

## 2018-12-14 NOTE — Telephone Encounter (Signed)
Patient stopped by office today with an empty tank he was given yesterday at his [place]. Patient was asking questions related to APS dropping off tanks to his house. I let him know I would call APS and look into it and see if they could drop off after 5pm and let him know.  Called APS talked to his case worker Claiborne Billings. She said it was no problem they would have the driver call him and come out after 5pm today. She also mentioned having tanks delivered regularly and they would pick up his broken POC.  ATC patient unable to reach , cell phone is daughters. Left message letting her know they would be calling this number.   Nothing further needed at this time.

## 2018-12-18 DIAGNOSIS — R69 Illness, unspecified: Secondary | ICD-10-CM | POA: Diagnosis not present

## 2018-12-18 DIAGNOSIS — J449 Chronic obstructive pulmonary disease, unspecified: Secondary | ICD-10-CM | POA: Diagnosis not present

## 2018-12-18 DIAGNOSIS — J441 Chronic obstructive pulmonary disease with (acute) exacerbation: Secondary | ICD-10-CM | POA: Diagnosis not present

## 2018-12-24 ENCOUNTER — Inpatient Hospital Stay (HOSPITAL_COMMUNITY)
Admission: EM | Admit: 2018-12-24 | Discharge: 2018-12-26 | DRG: 193 | Disposition: A | Discharge status: Home / Self Care | Payer: Medicare HMO | Attending: Internal Medicine | Admitting: Internal Medicine

## 2018-12-24 ENCOUNTER — Encounter (HOSPITAL_COMMUNITY): Payer: Self-pay

## 2018-12-24 ENCOUNTER — Other Ambulatory Visit: Payer: Self-pay

## 2018-12-24 ENCOUNTER — Emergency Department (HOSPITAL_COMMUNITY): Payer: Medicare HMO

## 2018-12-24 DIAGNOSIS — E119 Type 2 diabetes mellitus without complications: Secondary | ICD-10-CM | POA: Diagnosis present

## 2018-12-24 DIAGNOSIS — Z7951 Long term (current) use of inhaled steroids: Secondary | ICD-10-CM | POA: Diagnosis not present

## 2018-12-24 DIAGNOSIS — D649 Anemia, unspecified: Secondary | ICD-10-CM | POA: Diagnosis present

## 2018-12-24 DIAGNOSIS — J9621 Acute and chronic respiratory failure with hypoxia: Secondary | ICD-10-CM | POA: Diagnosis not present

## 2018-12-24 DIAGNOSIS — Z91048 Other nonmedicinal substance allergy status: Secondary | ICD-10-CM

## 2018-12-24 DIAGNOSIS — Z9981 Dependence on supplemental oxygen: Secondary | ICD-10-CM

## 2018-12-24 DIAGNOSIS — D509 Iron deficiency anemia, unspecified: Secondary | ICD-10-CM | POA: Diagnosis present

## 2018-12-24 DIAGNOSIS — N400 Enlarged prostate without lower urinary tract symptoms: Secondary | ICD-10-CM | POA: Diagnosis present

## 2018-12-24 DIAGNOSIS — R05 Cough: Secondary | ICD-10-CM | POA: Diagnosis not present

## 2018-12-24 DIAGNOSIS — Z833 Family history of diabetes mellitus: Secondary | ICD-10-CM

## 2018-12-24 DIAGNOSIS — R9431 Abnormal electrocardiogram [ECG] [EKG]: Secondary | ICD-10-CM | POA: Diagnosis present

## 2018-12-24 DIAGNOSIS — R0689 Other abnormalities of breathing: Secondary | ICD-10-CM | POA: Diagnosis not present

## 2018-12-24 DIAGNOSIS — I82409 Acute embolism and thrombosis of unspecified deep veins of unspecified lower extremity: Secondary | ICD-10-CM | POA: Diagnosis not present

## 2018-12-24 DIAGNOSIS — Z791 Long term (current) use of non-steroidal anti-inflammatories (NSAID): Secondary | ICD-10-CM

## 2018-12-24 DIAGNOSIS — Z79899 Other long term (current) drug therapy: Secondary | ICD-10-CM | POA: Diagnosis not present

## 2018-12-24 DIAGNOSIS — E785 Hyperlipidemia, unspecified: Secondary | ICD-10-CM | POA: Diagnosis not present

## 2018-12-24 DIAGNOSIS — J439 Emphysema, unspecified: Secondary | ICD-10-CM | POA: Diagnosis not present

## 2018-12-24 DIAGNOSIS — Z801 Family history of malignant neoplasm of trachea, bronchus and lung: Secondary | ICD-10-CM

## 2018-12-24 DIAGNOSIS — R7989 Other specified abnormal findings of blood chemistry: Secondary | ICD-10-CM

## 2018-12-24 DIAGNOSIS — J189 Pneumonia, unspecified organism: Secondary | ICD-10-CM | POA: Diagnosis present

## 2018-12-24 DIAGNOSIS — M8580 Other specified disorders of bone density and structure, unspecified site: Secondary | ICD-10-CM | POA: Diagnosis present

## 2018-12-24 DIAGNOSIS — R0602 Shortness of breath: Secondary | ICD-10-CM | POA: Diagnosis not present

## 2018-12-24 DIAGNOSIS — R0902 Hypoxemia: Secondary | ICD-10-CM | POA: Diagnosis not present

## 2018-12-24 DIAGNOSIS — Z87891 Personal history of nicotine dependence: Secondary | ICD-10-CM

## 2018-12-24 DIAGNOSIS — J449 Chronic obstructive pulmonary disease, unspecified: Secondary | ICD-10-CM | POA: Diagnosis not present

## 2018-12-24 DIAGNOSIS — J90 Pleural effusion, not elsewhere classified: Secondary | ICD-10-CM | POA: Diagnosis present

## 2018-12-24 DIAGNOSIS — I10 Essential (primary) hypertension: Secondary | ICD-10-CM | POA: Diagnosis present

## 2018-12-24 DIAGNOSIS — I491 Atrial premature depolarization: Secondary | ICD-10-CM | POA: Diagnosis not present

## 2018-12-24 DIAGNOSIS — Z209 Contact with and (suspected) exposure to unspecified communicable disease: Secondary | ICD-10-CM | POA: Diagnosis not present

## 2018-12-24 DIAGNOSIS — Z20828 Contact with and (suspected) exposure to other viral communicable diseases: Secondary | ICD-10-CM | POA: Diagnosis present

## 2018-12-24 DIAGNOSIS — M1990 Unspecified osteoarthritis, unspecified site: Secondary | ICD-10-CM | POA: Diagnosis present

## 2018-12-24 NOTE — ED Provider Notes (Signed)
[facility] EMERGENCY DEPARTMENT Provider Note   CSN: [id number] Arrival date & time: 12/24/18  2251    History   Chief Complaint Chief Complaint  Patient presents with   Shortness of Breath    HPI Cory Kemp is a 78 y.o. male.   The history is provided by the patient and the EMS personnel.  He has history of diabetes, hypertension, hyperlipidemia, COPD on home oxygen and comes in because of difficulty breathing which started tonight.  He states that he developed dyspnea tonight and tried using his albuterol inhaler and nebulizer without benefit.  He denies chest pain, heaviness, tightness, pressure.  Denies fever, chills, sweats.  He denies arthralgias or myalgias.  There has been a cough productive of clear sputum.  He denies any sick contacts and specifically denies exposure to COVID-19.  Of note, he denies orthopnea.  EMS reported initial oxygen saturation of 69% on room air which improved to 97% when he was on a nonrebreather mask.  He does admit to prior ED visits for COPD, but denies hospitalizations for COPD.  Past Medical History:  Diagnosis Date   Alcohol abuse    Anemia    Arthritis    Asthma    B12 deficiency 11/03/2013   BPH (benign prostatic hypertrophy)    COPD (chronic obstructive pulmonary disease) (HCC)    Dementia (HCC)    ?   Diabetes mellitus ([place]) 01/10/2012   Femoral hernia, right 03/27/2012   Glucose intolerance (impaired glucose tolerance)    Hyperlipidemia    Hypertension    Incisional hernias - swiss-cheese-type periumbilical 10/08/4399   Inguinal hernia - right 01/10/2012   Leukopenia 01/29/2013   Monocytosis 01/29/2013   Normochromic anemia 01/29/2013   Osteopenia     Patient Active Problem List   Diagnosis Date Noted   Chronic respiratory failure with hypoxia ([place]) 12/13/2018   Abnormal finding on EKG 01/03/2018   OSA (obstructive sleep apnea) 06/14/2017   Sinus tachycardia 04/11/2016   Essential  hypertension 04/11/2016   COPD (chronic obstructive pulmonary disease) ([place]) 01/11/2016   B12 deficiency 11/03/2013   Leukopenia 01/29/2013   Monocytosis 01/29/2013   Normochromic anemia 01/29/2013   Tobacco abuse 03/27/2012   Diabetes mellitus ([place]) 01/10/2012    Past Surgical History:  Procedure Laterality Date   HEMORRHOID SURGERY     HERNIA REPAIR     INGUINAL HERNIA REPAIR  02/24/2012   Procedure: LAPAROSCOPIC INGUINAL HERNIA;  Surgeon: Adin Hector, MD;  Location: [facility];  Service: General;  Laterality: N/A;   TUMOR EXCISION     intestinal after SBO ( Benign), appendectomy   VENTRAL HERNIA REPAIR  02/24/2012   Procedure: LAPAROSCOPIC VENTRAL HERNIA;  Surgeon: Adin Hector, MD;  Location: [facility];  Service: General;  Laterality: N/A;  lysis of adhesions, excision of abdominal wall lipomae        Home Medications    Prior to Admission medications   Medication Sig Start Date End Date Taking? Authorizing Provider  albuterol (PROVENTIL) (2.5 MG/3ML) 0.083% nebulizer solution Take 3 mLs (2.5 mg total) by nebulization every 6 (six) hours as needed for wheezing or shortness of breath. 01/03/18   Lauraine Rinne, NP  albuterol (VENTOLIN HFA) 108 (90 Base) MCG/ACT inhaler Inhale 2 puffs into the lungs every 4 (four) hours as needed for wheezing or shortness of breath. 12/13/18   Lauraine Rinne, NP  azithromycin (ZITHROMAX) 250 MG tablet 500mg  (two tablets) today, then 250mg  (1 tablet) for the next  4 days 12/13/18   Lauraine Rinne, NP  cholecalciferol (VITAMIN D) 1000 UNITS tablet Take 1,000 Units by mouth daily.     [provider]  cyclobenzaprine (FLEXERIL) 10 MG tablet Take 1 tablet (10 mg total) by mouth 2 (two) times daily as needed for muscle spasms. 12/12/18   Maudie Flakes, MD  diclofenac sodium (VOLTAREN) 1 % GEL Apply 4 g topically 4 (four) times daily. 08/28/18   Deno Etienne, DO  Fluticasone-Umeclidin-Vilant (TRELEGY ELLIPTA) 100-62.5-25 MCG/INH AEPB Inhale 1  puff into the lungs daily. 11/14/17   Mannam, Hart Robinsons, MD  Fluticasone-Umeclidin-Vilant (TRELEGY ELLIPTA) 100-62.5-25 MCG/INH AEPB Inhale 1 puff into the lungs daily. 02/02/18   Mannam, Hart Robinsons, MD  Fluticasone-Umeclidin-Vilant (TRELEGY ELLIPTA) 100-62.5-25 MCG/INH AEPB Inhale 1 puff into the lungs daily. 12/13/18   Lauraine Rinne, NP  losartan (COZAAR) 50 MG tablet Take 50 mg by mouth daily with breakfast.  12/26/11   [provider]  Naproxen Sodium (ALEVE) 220 MG CAPS Take 440 mg by mouth daily as needed (for pain). For pain    [provider]  predniSONE (DELTASONE) 10 MG tablet 4 tabs for 2 days, then 3 tabs for 2 days, 2 tabs for 2 days, then 1 tab for 2 days, then stop 12/13/18   Lauraine Rinne, NP  simvastatin (ZOCOR) 40 MG tablet Take 40 mg by mouth at bedtime.  12/27/11   [provider]  vitamin B-12 (CYANOCOBALAMIN) 100 MCG tablet Take 100 mcg by mouth daily.    [provider]    Family History Family History  Problem Relation Age of Onset   Lung cancer Father    Lung cancer Mother    Diabetes Brother     Social History Social History   Tobacco Use   Smoking status: Former Smoker    Packs/day: 1.00    Years: 63.00    Pack years: 63.00    Types: Cigarettes    Start date: 08/09/1955    Last attempt to quit: 08/08/2018    Years since quitting: 0.3   Smokeless tobacco: Never Used   Tobacco comment: reports he stopped at the beginning of this year  Substance Use Topics   Alcohol use: No    Alcohol/week: 0.0 standard drinks   Drug use: No     Allergies   Adhesive [tape]   Review of Systems Review of Systems  All other systems reviewed and are negative.    Physical Exam Updated Vital Signs BP (!) 149/55 (BP Location: Right Arm)    Pulse (!) 137    Temp 98.4 F (36.9 C) (Oral)    Resp (!) 23    Ht 6\' 1"  (1.854 m)    Wt 95.3 kg    SpO2 92%    BMI 27.71 kg/m   Physical Exam Vitals signs and nursing note reviewed.    78 year  old male, resting comfortably and in no acute distress. Vital signs are significant for rapid heart rate, respiratory rate and elevated blood pressure. Oxygen saturation is 92%, which is normal. Head is normocephalic and atraumatic. PERRLA, EOMI. Oropharynx is clear. Neck is nontender and supple without adenopathy or JVD. Back is nontender and there is no CVA tenderness.  There is trace presacral edema. Lungs have bibasilar rales and a slightly prolonged exhalation phase.  Difficult to assess for wheezes or rhonchi as the patient is groaning during exhalation. Chest is nontender. Heart is tachycardic with occasional premature beats, but without murmur. Abdomen is soft,  flat, nontender without masses or hepatosplenomegaly and peristalsis is normoactive. Extremities have 1+ pretibial edema, full range of motion is present. Skin is warm and dry without rash. Neurologic: Mental status is normal, cranial nerves are intact, there are no motor or sensory deficits.  ED Treatments / Results  Labs (all labs ordered are listed, but only abnormal results are displayed) Labs Reviewed  BASIC METABOLIC PANEL - Abnormal; Notable for the following components:      Result Value   Glucose, Bld 218 (*)    All other components within normal limits  CBC WITH DIFFERENTIAL/PLATELET - Abnormal; Notable for the following components:   WBC 3.8 (*)    RBC 3.82 (*)    Hemoglobin 11.5 (*)    HCT 36.3 (*)    Lymphs Abs 0.5 (*)    Abs Immature Granulocytes 0.10 (*)    All other components within normal limits  D-DIMER, QUANTITATIVE (NOT AT [facility]) - Abnormal; Notable for the following components:   D-Dimer, Quant 2.50 (*)    All other components within normal limits  SARS CORONAVIRUS 2 (HOSPITAL ORDER, [facility])  BRAIN NATRIURETIC PEPTIDE  TROPONIN I    EKG EKG Interpretation  Date/Time:  Monday Dec 24 2018 22:55:53 EDT Ventricular Rate:  143 PR Interval:    QRS Duration: 95 QT  Interval:  315 QTC Calculation: 486 R Axis:   82 Text Interpretation:  Sinus tachycardia Paired ventricular premature complexes Borderline right axis deviation Nonspecific T abnormalities, inferior leads Borderline prolonged QT interval When compared with ECG of 07/02/2017, Premature ventricular complexes are now present \ Confirmed by Delora Fuel ([id number]) on 12/24/2018 11:04:58 PM   Radiology Ct Angio Chest Pe W And/or Wo Contrast  Result Date: 12/25/2018 CLINICAL DATA:  78 year old male with sudden onset shortness of breath and productive cough. EXAM: CT ANGIOGRAPHY CHEST WITH CONTRAST TECHNIQUE: Multidetector CT imaging of the chest was performed using the standard protocol during bolus administration of intravenous contrast. Multiplanar CT image reconstructions and MIPs were obtained to evaluate the vascular anatomy. CONTRAST:  84mL OMNIPAQUE IOHEXOL 350 MG/ML SOLN COMPARISON:  Portable chest 12/24/2018 and earlier. Chest CTA 04/10/2016. FINDINGS: Cardiovascular: Adequate contrast bolus timing in the pulmonary arterial tree. Respiratory motion most pronounced in the lower lobes. No focal filling defect identified in the pulmonary arteries to suggest acute pulmonary embolism. Calcified aortic atherosclerosis. Calcified coronary artery atherosclerosis. No cardiomegaly or pericardial effusion. Mediastinum/Nodes: Mild, reactive appearing hilar lymph nodes. No mediastinal lymphadenopathy. Lungs/Pleura: Chronic emphysema. Major airways are patent but there is lower lobe peribronchial thickening greater on the left. There is left lower lobe consolidation mildly sparing the superior segment. Superimposed small layering left pleural effusion. There is a smaller area of peribronchial consolidation in the right lower lobe, with superimposed progressive lateral basal segment bullous emphysema since 2017. No right pleural effusion. upper abdomen: negative visible liver, spleen, pancreas, adrenal glands and bowel in  the upper abdomen. partially visible simple fluid density upper pole right renal cyst. Musculoskeletal: No acute osseous abnormality identified. Review of the MIP images confirms the above findings. IMPRESSION: 1. No evidence of acute pulmonary embolus. 2. Emphysema (ICD10-J43.9) with left greater than right lower lobe Pneumonia. Small layering left pleural effusion. Mild reactive hilar lymph nodes. 3. Calcified coronary artery and Aortic Atherosclerosis (ICD10-I70.0). Electronically Signed   By: Genevie Ann M.D.   On: 12/25/2018 03:24   Dg Chest Port 1 View  Result Date: 12/24/2018 CLINICAL DATA:  Shortness of breath EXAM: PORTABLE CHEST  1 VIEW COMPARISON:  Chest x-ray dated 12/12/2018 FINDINGS: The heart size is again enlarged. The lungs are hyperexpanded. There appears to be a new retrocardiac opacity. There is no pneumothorax. There is some mild blunting of the right costophrenic angle. There is no acute osseous abnormality. IMPRESSION: 1. New retrocardiac opacity concerning for aspiration or pneumonia. 2. COPD 3. Stable cardiomegaly. Electronically Signed   By: Constance Holster M.D.   On: 12/24/2018 23:49    Procedures Procedures  Medications Ordered in ED Medications  azithromycin (ZITHROMAX) 500 mg in sodium chloride 0.9 % 250 mL IVPB (500 mg Intravenous New Bag/Given 12/25/18 0356)  iohexol (OMNIPAQUE) 350 MG/ML injection 75 mL (75 mLs Intravenous Contrast Given 12/25/18 0305)  cefTRIAXone (ROCEPHIN) 1 g in sodium chloride 0.9 % 100 mL IVPB (1 g Intravenous New Bag/Given 12/25/18 0354)     Initial Impression / Assessment and Plan / ED Course  I have reviewed the triage vital signs and the nursing notes.  Pertinent labs & imaging results that were available during my care of the patient were reviewed by me and considered in my medical decision making (see chart for details).  Dyspnea and patient with history of COPD.  Physical findings are less typical of COPD and more typical of CHF.  Will  check electrolytes, troponin, BNP.  Will also check d-dimer to rule out pulmonary embolism.  Doubt pneumonia with no fever and clear sputum.  Old records are reviewed confirming ED visits for COPD but no hospitalizations.  Chest x-ray suggests retrocardiac infiltrate.  D-dimer is come back elevated, will send for CT angiogram of chest.  Labs are unremarkable including normal BNP and normal troponin.  CT angiogram shows no evidence of pulmonary embolism, but bibasilar infiltrates are present consistent with pneumonia.  Patient actually states that he is feeling better, but this is on 4 L of oxygen via nasal cannula which is greater than what he gets at home.  He is started on ceftriaxone and azithromycin.  Case is discussed with Dr. Marlowe Sax of [facility] agrees to admit the patient.  Cory Kemp was evaluated in Emergency Department on 12/25/2018 for the symptoms described in the history of present illness. He was evaluated in the context of the global COVID-19 pandemic, which necessitated consideration that the patient might be at risk for infection with the SARS-CoV-2 virus that causes COVID-19. Institutional protocols and algorithms that pertain to the evaluation of patients at risk for COVID-19 are in a state of rapid change based on information released by regulatory bodies including the CDC and federal and state organizations. These policies and algorithms were followed during the patient's care in the ED.  Final Clinical Impressions(s) / ED Diagnoses   Final diagnoses:  Community acquired pneumonia, unspecified laterality  Chronic obstructive pulmonary disease, unspecified COPD type ([place])  Normochromic normocytic anemia    ED Discharge Orders    None       Delora Fuel, MD 88/41/66 [phone]

## 2018-12-24 NOTE — ED Triage Notes (Signed)
Per [facility], pt from home w/ a sudden onset of SOB. Pt reports using nebulizer and inhalers without relief of sOB. No CP. No N/V. Reports increased productive cough. Rales noted with LS.  SpO2 69% RA --> 97% NRB HR 100-150 165/62 RR 24  20 ga IV L forearm

## 2018-12-25 ENCOUNTER — Emergency Department (HOSPITAL_COMMUNITY): Payer: Medicare HMO

## 2018-12-25 ENCOUNTER — Inpatient Hospital Stay (HOSPITAL_COMMUNITY): Payer: Medicare HMO

## 2018-12-25 DIAGNOSIS — J189 Pneumonia, unspecified organism: Secondary | ICD-10-CM | POA: Diagnosis present

## 2018-12-25 DIAGNOSIS — R7989 Other specified abnormal findings of blood chemistry: Secondary | ICD-10-CM

## 2018-12-25 DIAGNOSIS — I82409 Acute embolism and thrombosis of unspecified deep veins of unspecified lower extremity: Secondary | ICD-10-CM

## 2018-12-25 DIAGNOSIS — J9621 Acute and chronic respiratory failure with hypoxia: Secondary | ICD-10-CM

## 2018-12-25 LAB — CBC WITH DIFFERENTIAL/PLATELET
Abs Immature Granulocytes: 0.1 10*3/uL — ABNORMAL HIGH (ref 0.00–0.07)
Basophils Absolute: 0 10*3/uL (ref 0.0–0.1)
Basophils Relative: 0 %
Eosinophils Absolute: 0 10*3/uL (ref 0.0–0.5)
Eosinophils Relative: 0 %
HCT: 36.3 % — ABNORMAL LOW (ref 39.0–52.0)
Hemoglobin: 11.5 g/dL — ABNORMAL LOW (ref 13.0–17.0)
Immature Granulocytes: 3 %
Lymphocytes Relative: 12 %
Lymphs Abs: 0.5 10*3/uL — ABNORMAL LOW (ref 0.7–4.0)
MCH: 30.1 pg (ref 26.0–34.0)
MCHC: 31.7 g/dL (ref 30.0–36.0)
MCV: 95 fL (ref 80.0–100.0)
Monocytes Absolute: 0.5 10*3/uL (ref 0.1–1.0)
Monocytes Relative: 12 %
Neutro Abs: 2.7 10*3/uL (ref 1.7–7.7)
Neutrophils Relative %: 73 %
Platelets: 159 10*3/uL (ref 150–400)
RBC: 3.82 MIL/uL — ABNORMAL LOW (ref 4.22–5.81)
RDW: 13.9 % (ref 11.5–15.5)
WBC: 3.8 10*3/uL — ABNORMAL LOW (ref 4.0–10.5)
nRBC: 0 % (ref 0.0–0.2)

## 2018-12-25 LAB — CBC
HCT: 35.4 % — ABNORMAL LOW (ref 39.0–52.0)
Hemoglobin: 11.2 g/dL — ABNORMAL LOW (ref 13.0–17.0)
MCH: 30.1 pg (ref 26.0–34.0)
MCHC: 31.6 g/dL (ref 30.0–36.0)
MCV: 95.2 fL (ref 80.0–100.0)
Platelets: 161 10*3/uL (ref 150–400)
RBC: 3.72 MIL/uL — ABNORMAL LOW (ref 4.22–5.81)
RDW: 13.7 % (ref 11.5–15.5)
WBC: 2.6 10*3/uL — ABNORMAL LOW (ref 4.0–10.5)
nRBC: 0 % (ref 0.0–0.2)

## 2018-12-25 LAB — RESPIRATORY PANEL BY PCR

## 2018-12-25 LAB — GLUCOSE, CAPILLARY
Glucose-Capillary: 134 mg/dL — ABNORMAL HIGH (ref 70–99)
Glucose-Capillary: 122 mg/dL — ABNORMAL HIGH (ref 70–99)
Glucose-Capillary: 159 mg/dL — ABNORMAL HIGH (ref 70–99)

## 2018-12-25 LAB — BRAIN NATRIURETIC PEPTIDE: B Natriuretic Peptide: 47.6 pg/mL (ref 0.0–100.0)

## 2018-12-25 LAB — BASIC METABOLIC PANEL
Anion gap: 10 (ref 5–15)
BUN: 19 mg/dL (ref 8–23)
CO2: 26 mmol/L (ref 22–32)
Calcium: 9.2 mg/dL (ref 8.9–10.3)
Chloride: 104 mmol/L (ref 98–111)
Creatinine, Ser: 1.13 mg/dL (ref 0.61–1.24)
GFR calc Af Amer: >60 mL/min (ref >60)
GFR calc non Af Amer: >60 mL/min (ref >60)
Glucose, Bld: 218 mg/dL — ABNORMAL HIGH (ref 70–99)
Potassium: 4.5 mmol/L (ref 3.5–5.1)
Sodium: 140 mmol/L (ref 135–145)

## 2018-12-25 LAB — PROCALCITONIN: Procalcitonin: <0.1 ng/mL

## 2018-12-25 LAB — IRON AND TIBC
Iron: 28 ug/dL — ABNORMAL LOW (ref 45–182)
Saturation Ratios: 10 % — ABNORMAL LOW (ref 17.9–39.5)
TIBC: 283 ug/dL (ref 250–450)
UIBC: 255 ug/dL

## 2018-12-25 LAB — HEMOGLOBIN A1C
Hgb A1c MFr Bld: 5.7 % — ABNORMAL HIGH (ref 4.8–5.6)
Mean Plasma Glucose: 116.89 mg/dL

## 2018-12-25 LAB — D-DIMER, QUANTITATIVE: D-Dimer, Quant: 2.5 ug/mL-FEU — ABNORMAL HIGH (ref 0.00–0.50)

## 2018-12-25 LAB — SARS CORONAVIRUS 2 BY RT PCR (HOSPITAL ORDER, PERFORMED IN ~~LOC~~ HOSPITAL LAB): SARS Coronavirus 2: NEGATIVE

## 2018-12-25 LAB — TROPONIN I: Troponin I: <0.03 ng/mL (ref <0.03)

## 2018-12-25 LAB — HIV ANTIBODY (ROUTINE TESTING W REFLEX): HIV Screen 4th Generation wRfx: NONREACTIVE

## 2018-12-25 LAB — FERRITIN: Ferritin: 54 ng/mL (ref 24–336)

## 2018-12-25 MED ORDER — INSULIN ASPART 100 UNIT/ML ~~LOC~~ SOLN
0.0000 [IU] | Freq: Three times a day (TID) | SUBCUTANEOUS | Status: DC
  Administered 2018-12-25: 1 [IU] via SUBCUTANEOUS

## 2018-12-25 MED ORDER — SODIUM CHLORIDE 0.9 % IV SOLN: 500.0000 mg | INTRAVENOUS | Status: DC

## 2018-12-25 MED ORDER — SODIUM CHLORIDE 0.9 % IV SOLN
500.0000 mg | Freq: Once | INTRAVENOUS | Status: AC
  Administered 2018-12-25: 500 mg via INTRAVENOUS
  Filled 2018-12-25: qty 500

## 2018-12-25 MED ORDER — TRAZODONE HCL 50 MG PO TABS
50.0000 mg | ORAL_TABLET | Freq: Once | ORAL | Status: AC
  Administered 2018-12-25: 50 mg via ORAL
  Filled 2018-12-25: qty 1

## 2018-12-25 MED ORDER — FLUTICASONE-UMECLIDIN-VILANT 100-62.5-25 MCG/INH IN AEPB: 1.0000 | INHALATION_SPRAY | Freq: Every day | RESPIRATORY_TRACT | Status: DC

## 2018-12-25 MED ORDER — SODIUM CHLORIDE 0.9 % IV SOLN
1.0000 g | Freq: Once | INTRAVENOUS | Status: AC
  Administered 2018-12-25: 1 g via INTRAVENOUS
  Filled 2018-12-25: qty 10

## 2018-12-25 MED ORDER — IOHEXOL 350 MG/ML SOLN
75.0000 mL | Freq: Once | INTRAVENOUS | Status: AC | PRN
  Administered 2018-12-25: 75 mL via INTRAVENOUS

## 2018-12-25 MED ORDER — SODIUM CHLORIDE 0.9 % IV SOLN
1.0000 g | INTRAVENOUS | Status: DC
  Administered 2018-12-26: 1 g via INTRAVENOUS
  Filled 2018-12-25: qty 10

## 2018-12-25 MED ORDER — INSULIN ASPART 100 UNIT/ML ~~LOC~~ SOLN: 0.0000 [IU] | Freq: Three times a day (TID) | SUBCUTANEOUS | Status: DC

## 2018-12-25 MED ORDER — FLUTICASONE FUROATE-VILANTEROL 100-25 MCG/INH IN AEPB
1.0000 | INHALATION_SPRAY | Freq: Every day | RESPIRATORY_TRACT | Status: DC
  Administered 2018-12-25 – 2018-12-26 (×2): 1 via RESPIRATORY_TRACT
  Filled 2018-12-25: qty 28

## 2018-12-25 MED ORDER — LOSARTAN POTASSIUM 50 MG PO TABS
50.0000 mg | ORAL_TABLET | Freq: Every day | ORAL | Status: DC
  Administered 2018-12-25: 50 mg via ORAL
  Filled 2018-12-25: qty 1

## 2018-12-25 MED ORDER — VITAMIN D 25 MCG (1000 UNIT) PO TABS
1000.0000 [IU] | ORAL_TABLET | Freq: Every day | ORAL | Status: DC
  Administered 2018-12-25: 1000 [IU] via ORAL
  Filled 2018-12-25: qty 1

## 2018-12-25 MED ORDER — IPRATROPIUM-ALBUTEROL 0.5-2.5 (3) MG/3ML IN SOLN: 3.0000 mL | Freq: Four times a day (QID) | RESPIRATORY_TRACT | Status: DC | PRN

## 2018-12-25 MED ORDER — UMECLIDINIUM BROMIDE 62.5 MCG/INH IN AEPB
1.0000 | INHALATION_SPRAY | Freq: Every day | RESPIRATORY_TRACT | Status: DC
  Administered 2018-12-25 – 2018-12-26 (×2): 1 via RESPIRATORY_TRACT
  Filled 2018-12-25: qty 7

## 2018-12-25 MED ORDER — DOXYCYCLINE HYCLATE 100 MG PO TABS
100.0000 mg | ORAL_TABLET | Freq: Two times a day (BID) | ORAL | Status: DC
  Administered 2018-12-25 – 2018-12-26 (×3): 100 mg via ORAL
  Filled 2018-12-25 (×3): qty 1

## 2018-12-25 MED ORDER — SIMVASTATIN 20 MG PO TABS
40.0000 mg | ORAL_TABLET | Freq: Every day | ORAL | Status: DC
  Administered 2018-12-25: 40 mg via ORAL
  Filled 2018-12-25: qty 2

## 2018-12-25 MED ORDER — VITAMIN B-12 100 MCG PO TABS
100.0000 ug | ORAL_TABLET | Freq: Every day | ORAL | Status: DC
  Administered 2018-12-25: 100 ug via ORAL
  Filled 2018-12-25 (×2): qty 1

## 2018-12-25 MED ORDER — ENOXAPARIN SODIUM 40 MG/0.4ML ~~LOC~~ SOLN
40.0000 mg | SUBCUTANEOUS | Status: DC
  Administered 2018-12-25: 40 mg via SUBCUTANEOUS
  Filled 2018-12-25: qty 0.4

## 2018-12-25 NOTE — H&P (Signed)
History and Physical    Cory Kemp Cory Kemp DOB: 05-Sep-1940 DOA: 12/24/2018  PCP: Jani Gravel, MD Patient coming from: Home  Chief Complaint: Shortness of breath, cough  HPI: Cory Kemp is a 78 y.o. male with medical history significant of COPD, type 2 diabetes, hypertension, hyperlipidemia, anemia, and conditions listed below presenting to the hospital via EMS for evaluation of shortness of breath and cough.  Per EMS, SPO2 69% on room air and patient was placed on a nonrebreather.  Tachycardic with heart rate ranging from 100-150.  Tachypneic with respiratory rate 24.  Blood pressure 165/62.  Patient states he has COPD but for the past few days has been feeling increasingly short of breath, fatigued, and has been coughing.  Cough is productive of clear/yellow sputum.  He has not been wheezing much.  Denies any fevers or chills.  Denies any chest pain, abdominal pain, nausea, vomiting, or diarrhea.  He quit smoking in January.  States he uses 3 to 4 L oxygen at home all the time.  ED Course: Afebrile, tachycardic and tachypneic.  Requiring 4 L supplemental oxygen in the ED.  White count 3.8, absolute lymphocyte count 0.5.[name] Kitchen  Hemoglobin 11.5, was 13.5 a year ago.  Blood glucose 218.  BNP normal.  COVID-19 rapid test negative.  Troponin negative.  D-dimer elevated at 2.5.  Chest x-ray (personally reviewed) showing new retrocardiac opacity concerning for pneumonia.  CT angiogram without evidence of acute PE.  Showing emphysema with left greater than right lower lobe pneumonia.  Small layering left pleural effusion.  Mild reactive hilar lymph nodes.  Ceftriaxone and azithromycin administered in the ED.  Review of Systems: All systems reviewed and apart from history of presenting illness, are negative.  Past Medical History:  Diagnosis Date  . Alcohol abuse   . Anemia   . Arthritis   . Asthma   . B12 deficiency 11/03/2013  . BPH (benign prostatic hypertrophy)   . COPD (chronic  obstructive pulmonary disease) ([place])   . Dementia ([place])    ?  [name] Kitchen Diabetes mellitus ([place]) 01/10/2012  . Femoral hernia, right 03/27/2012  . Glucose intolerance (impaired glucose tolerance)   . Hyperlipidemia   . Hypertension   . Incisional hernias - swiss-cheese-type periumbilical 03/08/8298  . Inguinal hernia - right 01/10/2012  . Leukopenia 01/29/2013  . Monocytosis 01/29/2013  . Normochromic anemia 01/29/2013  . Osteopenia     Past Surgical History:  Procedure Laterality Date  . HEMORRHOID SURGERY    . HERNIA REPAIR    . INGUINAL HERNIA REPAIR  02/24/2012   Procedure: LAPAROSCOPIC INGUINAL HERNIA;  Surgeon: Adin Hector, MD;  Location: [facility];  Service: General;  Laterality: N/A;  . TUMOR EXCISION     intestinal after SBO ( Benign), appendectomy  . VENTRAL HERNIA REPAIR  02/24/2012   Procedure: LAPAROSCOPIC VENTRAL HERNIA;  Surgeon: Adin Hector, MD;  Location: [facility];  Service: General;  Laterality: N/A;  lysis of adhesions, excision of abdominal wall lipomae     reports that he quit smoking about 4 months ago. His smoking use included cigarettes. He started smoking about 63 years ago. He has a 63.00 pack-year smoking history. He has never used smokeless tobacco. He reports that he does not drink alcohol or use drugs.  Allergies  Allergen Reactions  . Adhesive [Tape] Rash    Family History  Problem Relation Age of Onset  . Lung cancer Father   . Lung cancer Mother   . Diabetes Brother  Prior to Admission medications   Medication Sig Start Date End Date Taking? Authorizing Provider  albuterol (PROVENTIL) (2.5 MG/3ML) 0.083% nebulizer solution Take 3 mLs (2.5 mg total) by nebulization every 6 (six) hours as needed for wheezing or shortness of breath. 01/03/18  Yes Lauraine Rinne, NP  albuterol (VENTOLIN HFA) 108 (90 Base) MCG/ACT inhaler Inhale 2 puffs into the lungs every 4 (four) hours as needed for wheezing or shortness of breath. 12/13/18  Yes Lauraine Rinne, NP   cholecalciferol (VITAMIN D) 1000 UNITS tablet Take 1,000 Units by mouth at bedtime.    Yes [provider]  cyclobenzaprine (FLEXERIL) 10 MG tablet Take 1 tablet (10 mg total) by mouth 2 (two) times daily as needed for muscle spasms. 12/12/18  Yes Maudie Flakes, MD  diclofenac sodium (VOLTAREN) 1 % GEL Apply 4 g topically 4 (four) times daily. Patient taking differently: Apply 4 g topically 4 (four) times daily as needed (for pain).  08/28/18  Yes Deno Etienne, DO  Fluticasone-Umeclidin-Vilant (TRELEGY ELLIPTA) 100-62.5-25 MCG/INH AEPB Inhale 1 puff into the lungs daily. 02/02/18  Yes Mannam, Praveen, MD  losartan (COZAAR) 50 MG tablet Take 50 mg by mouth at bedtime.  12/26/11  Yes [provider]  Naproxen Sodium (ALEVE) 220 MG CAPS Take 440 mg by mouth daily as needed (for pain). For pain   Yes [provider]  simvastatin (ZOCOR) 40 MG tablet Take 40 mg by mouth at bedtime.  12/27/11  Yes [provider]  vitamin B-12 (CYANOCOBALAMIN) 100 MCG tablet Take 100 mcg by mouth at bedtime.    Yes [provider]  azithromycin (ZITHROMAX) 250 MG tablet 500mg  (two tablets) today, then 250mg  (1 tablet) for the next 4 days Patient not taking: Reported on 12/25/2018 12/13/18   Lauraine Rinne, NP  Fluticasone-Umeclidin-Vilant (TRELEGY ELLIPTA) 100-62.5-25 MCG/INH AEPB Inhale 1 puff into the lungs daily. Patient not taking: Reported on 12/25/2018 11/14/17   [name], MD  Fluticasone-Umeclidin-Vilant (TRELEGY ELLIPTA) 100-62.5-25 MCG/INH AEPB Inhale 1 puff into the lungs daily. Patient not taking: Reported on 12/25/2018 12/13/18   Lauraine Rinne, NP  predniSONE (DELTASONE) 10 MG tablet 4 tabs for 2 days, then 3 tabs for 2 days, 2 tabs for 2 days, then 1 tab for 2 days, then stop Patient not taking: Reported on 12/25/2018 12/13/18   Lauraine Rinne, NP    Physical Exam: Vitals:   12/25/18 0215 12/25/18 0345 12/25/18 0415 12/25/18 0555  BP: (!) 167/84 (!) 158/85 (!) 147/84 (!)  149/79  Pulse: 99 94 100 93  Resp: (!) 27 (!) 23 (!) 26 16  Temp:    97.9 F (36.6 C)  TempSrc:    Oral  SpO2: 96% 96% 94% 97%  Weight:      Height:        Physical Exam  Constitutional: He is oriented to person, place, and time. He appears well-developed and well-nourished. No distress.  HENT:  Head: Normocephalic.  Mouth/Throat: Oropharynx is clear and moist.  Eyes: Right eye exhibits no discharge. Left eye exhibits no discharge.  Neck: Neck supple.  Cardiovascular: Normal rate, regular rhythm and intact distal pulses.  Pulmonary/Chest: He has no wheezes. He has rales.  On 4 L supplemental oxygen Bibasilar rales  Abdominal: Soft. Bowel sounds are normal. He exhibits no distension. There is no abdominal tenderness. There is no guarding.  Musculoskeletal:     Comments: Trace edema of bilateral lower extremities  Neurological: He is alert and oriented to  person, place, and time.  Skin: Skin is warm and dry. He is not diaphoretic.  Psychiatric: He has a normal mood and affect. His behavior is normal.     Labs on Admission: I have personally reviewed following labs and imaging studies  CBC: Recent Labs  Lab 12/24/18 2344 12/25/18 0509  WBC 3.8* 2.6*  NEUTROABS 2.7  --   HGB 11.5* 11.2*  HCT 36.3* 35.4*  MCV 95.0 95.2  PLT 159 295   Basic Metabolic Panel: Recent Labs  Lab 12/24/18 2344  NA 140  K 4.5  CL 104  CO2 26  GLUCOSE 218*  BUN 19  CREATININE 1.13  CALCIUM 9.2   GFR: Estimated Creatinine Clearance: 61.9 mL/min (by C-G formula based on SCr of 1.13 mg/dL). Liver Function Tests: No results for input(s): AST, ALT, ALKPHOS, BILITOT, PROT, ALBUMIN in the last 168 hours. No results for input(s): LIPASE, AMYLASE in the last 168 hours. No results for input(s): AMMONIA in the last 168 hours. Coagulation Profile: No results for input(s): INR, PROTIME in the last 168 hours. Cardiac Enzymes: Recent Labs  Lab 12/24/18 2344  TROPONINI <0.03   BNP (last 3  results) No results for input(s): PROBNP in the last 8760 hours. HbA1C: No results for input(s): HGBA1C in the last 72 hours. CBG: No results for input(s): GLUCAP in the last 168 hours. Lipid Profile: No results for input(s): CHOL, HDL, LDLCALC, TRIG, CHOLHDL, LDLDIRECT in the last 72 hours. Thyroid Function Tests: No results for input(s): TSH, T4TOTAL, FREET4, T3FREE, THYROIDAB in the last 72 hours. Anemia Panel: No results for input(s): VITAMINB12, FOLATE, FERRITIN, TIBC, IRON, RETICCTPCT in the last 72 hours. Urine analysis:    Component Value Date/Time   COLORURINE YELLOW 07/22/2013 0725   APPEARANCEUR CLEAR 07/22/2013 0725   LABSPEC 1.011 07/22/2013 0725   PHURINE 6.5 07/22/2013 0725   GLUCOSEU NEGATIVE 07/22/2013 0725   HGBUR TRACE (A) 07/22/2013 0725   BILIRUBINUR NEGATIVE 07/22/2013 0725   KETONESUR NEGATIVE 07/22/2013 0725   PROTEINUR NEGATIVE 07/22/2013 0725   UROBILINOGEN 0.2 07/22/2013 0725   NITRITE NEGATIVE 07/22/2013 0725   LEUKOCYTESUR NEGATIVE 07/22/2013 0725    Radiological Exams on Admission: Ct Angio Chest Pe W And/or Wo Contrast  Result Date: 12/25/2018 CLINICAL DATA:  78 year old male with sudden onset shortness of breath and productive cough. EXAM: CT ANGIOGRAPHY CHEST WITH CONTRAST TECHNIQUE: Multidetector CT imaging of the chest was performed using the standard protocol during bolus administration of intravenous contrast. Multiplanar CT image reconstructions and MIPs were obtained to evaluate the vascular anatomy. CONTRAST:  70mL OMNIPAQUE IOHEXOL 350 MG/ML SOLN COMPARISON:  Portable chest 12/24/2018 and earlier. Chest CTA 04/10/2016. FINDINGS: Cardiovascular: Adequate contrast bolus timing in the pulmonary arterial tree. Respiratory motion most pronounced in the lower lobes. No focal filling defect identified in the pulmonary arteries to suggest acute pulmonary embolism. Calcified aortic atherosclerosis. Calcified coronary artery atherosclerosis. No  cardiomegaly or pericardial effusion. Mediastinum/Nodes: Mild, reactive appearing hilar lymph nodes. No mediastinal lymphadenopathy. Lungs/Pleura: Chronic emphysema. Major airways are patent but there is lower lobe peribronchial thickening greater on the left. There is left lower lobe consolidation mildly sparing the superior segment. Superimposed small layering left pleural effusion. There is a smaller area of peribronchial consolidation in the right lower lobe, with superimposed progressive lateral basal segment bullous emphysema since 2017. No right pleural effusion. upper abdomen: negative visible liver, spleen, pancreas, adrenal glands and bowel in the upper abdomen. partially visible simple fluid density upper pole right renal cyst. Musculoskeletal: No  acute osseous abnormality identified. Review of the MIP images confirms the above findings. IMPRESSION: 1. No evidence of acute pulmonary embolus. 2. Emphysema (ICD10-J43.9) with left greater than right lower lobe Pneumonia. Small layering left pleural effusion. Mild reactive hilar lymph nodes. 3. Calcified coronary artery and Aortic Atherosclerosis (ICD10-I70.0). Electronically Signed   By: Genevie Ann M.D.   On: 12/25/2018 03:24   Dg Chest Port 1 View  Result Date: 12/24/2018 CLINICAL DATA:  Shortness of breath EXAM: PORTABLE CHEST 1 VIEW COMPARISON:  Chest x-ray dated 12/12/2018 FINDINGS: The heart size is again enlarged. The lungs are hyperexpanded. There appears to be a new retrocardiac opacity. There is no pneumothorax. There is some mild blunting of the right costophrenic angle. There is no acute osseous abnormality. IMPRESSION: 1. New retrocardiac opacity concerning for aspiration or pneumonia. 2. COPD 3. Stable cardiomegaly. Electronically Signed   By: Constance Holster M.D.   On: 12/24/2018 23:49    EKG: Independently reviewed.  Sinus tachycardia, PVCs.  Assessment/Plan Principal Problem:   CAP (community acquired pneumonia) Active Problems:    Diabetes mellitus ([place])   Normochromic anemia   COPD (chronic obstructive pulmonary disease) ([facility])   Acute on chronic respiratory failure with hypoxia ([facility])   Elevated d-dimer   Community-acquired pneumonia Afebrile. Tachycardic and tachypneic on arrival, now resolved.  No leukocytosis. CT angiogram without evidence of acute PE.  Showing emphysema with left greater than right lower lobe pneumonia.  COVID-19 rapid test negative. PSI/ PORT score 117 (risk class IV) with 8.2-9.3% mortality risk. -Continue ceftriaxone and azithromycin -Check procalcitonin level -Respiratory viral panel  Acute on chronic hypoxic respiratory failure secondary to community-acquired pneumonia SPO2 69% on room air.  Currently requiring 4 L supplemental oxygen.  He uses 3 to 4 L home oxygen at baseline. -Management of pneumonia as mentioned above -Continue supplemental oxygen  Elevated d-dimer D-dimer 2.5.  CT angiogram negative for PE. -Check lower extremity Dopplers to rule out DVT  Normocytic anemia Hemoglobin 11.5, was 13.5 a year ago.  No signs of active bleeding. -Check iron, ferritin, TIBC  COPD -Stable.  Not wheezing on exam.  Brio and Incruse Ellipta schedule.  DuoNebs PRN.  Diet controlled type 2 diabetes Blood glucose 218 on admission. -Check A1c.  Sliding scale insulin sensitive and CBG checks.  Hypertension -Continue home losartan  Hyperlipidemia -Continue home Zocor  DVT prophylaxis: Lovenox Code Status: Patient wishes to be full code. Family Communication: No family available. Disposition Plan: Anticipate discharge after clinical improvement. Consults called: None Admission status: It is my clinical opinion that admission to [place] is reasonable and necessary in this 78 y.o. male . presenting with symptoms of shortness of breath, cough, fatigue, concerning for community-acquired pneumonia . in the context of PMH including: Hypertension, hyperlipidemia, type 2 diabetes, COPD .  with pertinent positives on physical exam including: Supplemental oxygen requirement . and pertinent positives on radiographic and laboratory data including: Imaging with evidence of bilateral lower lobe pneumonia. . Workup and treatment include IV antibiotics.  [name] Kitchen PSI/ PORT score 117 (risk class IV) with 8.2-9.3% mortality risk.  Given the aforementioned, the predictability of an adverse outcome is felt to be significant. I expect that the patient will require at least 2 midnights in the hospital to treat this condition.   The medical decision making on this patient was of high complexity and the patient is at high risk for clinical deterioration, therefore this is a level 3 visit.  Shela Leff MD [facility] Pager [phone]  If 7PM-7AM, please contact night-coverage [email or web address] Password [facility]  12/25/2018, 6:40 AM

## 2018-12-25 NOTE — Progress Notes (Signed)
Admission from earlier this morning with pneumonia.  Patient with COPD on 2 L by nasal cannula at baseline. Reports improvement in his breathing. We will continue ceftriaxone.  Will change azithromycin to doxycycline.  He recently completed course of azithromycin.  He also had QTC prolongation. Updated patient's daughter over the phone. Anticipate discharge in the next 24-hour.  Ambulated on 3 L and maintain good saturation but need to be ambulated on 2 L (home level).

## 2018-12-25 NOTE — ED Notes (Signed)
ED TO INPATIENT HANDOFF REPORT  ED Nurse Name and Phone #: Suezanne Jacquet [phone]  S Name/Age/Gender Cory Kemp 78 y.o. male Room/Bed: 033C/033C  Code Status   Code Status: Full Code  Home/SNF/Other Home Patient oriented to: self, place, time and situation Is this baseline? Yes   Triage Complete: Triage complete  Chief Complaint [facility]  Triage Note Per [facility], pt from home w/ a sudden onset of SOB. Pt reports using nebulizer and inhalers without relief of sOB. No CP. No N/V. Reports increased productive cough. Rales noted with LS.  SpO2 69% RA --> 97% NRB HR 100-150 165/62 RR 24  20 ga IV L forearm    Allergies Allergies  Allergen Reactions  . Adhesive [Tape] Rash    Level of Care/Admitting Diagnosis ED Disposition    ED Disposition Condition Comment   Admit  Hospital Area: [facility] [[id number]]  Level of Care: Telemetry Medical [104]  Covid Evaluation: N/A  Diagnosis: CAP (community acquired pneumonia) [725366]  Admitting Physician: Shela Leff [[id number]]  Attending Physician: Shela Leff [[id number]]  Estimated length of stay: past midnight tomorrow  Certification:: I certify this patient will need inpatient services for at least 2 midnights  PT Class (Do Not Modify): Inpatient [101]  PT Acc Code (Do Not Modify): Private [1]       B Medical/Surgery History Past Medical History:  Diagnosis Date  . Alcohol abuse   . Anemia   . Arthritis   . Asthma   . B12 deficiency 11/03/2013  . BPH (benign prostatic hypertrophy)   . COPD (chronic obstructive pulmonary disease) ([place])   . Dementia ([place])    ?  [name] Kitchen Diabetes mellitus ([place]) 01/10/2012  . Femoral hernia, right 03/27/2012  . Glucose intolerance (impaired glucose tolerance)   . Hyperlipidemia   . Hypertension   . Incisional hernias - swiss-cheese-type periumbilical 02/10/6432  . Inguinal hernia - right 01/10/2012  . Leukopenia 01/29/2013  . Monocytosis 01/29/2013  . Normochromic anemia  01/29/2013  . Osteopenia    Past Surgical History:  Procedure Laterality Date  . HEMORRHOID SURGERY    . HERNIA REPAIR    . INGUINAL HERNIA REPAIR  02/24/2012   Procedure: LAPAROSCOPIC INGUINAL HERNIA;  Surgeon: Adin Hector, MD;  Location: [facility];  Service: General;  Laterality: N/A;  . TUMOR EXCISION     intestinal after SBO ( Benign), appendectomy  . VENTRAL HERNIA REPAIR  02/24/2012   Procedure: LAPAROSCOPIC VENTRAL HERNIA;  Surgeon: Adin Hector, MD;  Location: [facility];  Service: General;  Laterality: N/A;  lysis of adhesions, excision of abdominal wall lipomae     A IV Location/Drains/Wounds Patient Lines/Drains/Airways Status   Active Line/Drains/Airways    Name:   Placement date:   Placement time:   Site:   Days:   Peripheral IV 12/24/18 Left Forearm   12/24/18    2255    Forearm   1          Intake/Output Last 24 hours No intake or output data in the 24 hours ending 12/25/18 0507  Labs/Imaging Results for orders placed or performed during the hospital encounter of 12/24/18 (from the past 48 hour(s))  SARS Coronavirus 2 [facility] order, Performed in [place] hospital lab)     Status: None   Collection Time: 12/24/18 11:44 PM  Result Value Ref Range   SARS Coronavirus 2 NEGATIVE NEGATIVE    Comment: (NOTE) If result is NEGATIVE SARS-CoV-2 target nucleic acids are NOT DETECTED. The SARS-CoV-2 RNA  is generally detectable in upper and lower  respiratory specimens during the acute phase of infection. The lowest  concentration of SARS-CoV-2 viral copies this assay can detect is 250  copies / mL. A negative result does not preclude SARS-CoV-2 infection  and should not be used as the sole basis for treatment or other  patient management decisions.  A negative result may occur with  improper specimen collection / handling, submission of specimen other  than nasopharyngeal swab, presence of viral mutation(s) within the  areas targeted by this assay, and inadequate  number of viral copies  (<250 copies / mL). A negative result must be combined with clinical  observations, patient history, and epidemiological information. If result is POSITIVE SARS-CoV-2 target nucleic acids are DETECTED. The SARS-CoV-2 RNA is generally detectable in upper and lower  respiratory specimens dur ing the acute phase of infection.  Positive  results are indicative of active infection with SARS-CoV-2.  Clinical  correlation with patient history and other diagnostic information is  necessary to determine patient infection status.  Positive results do  not rule out bacterial infection or co-infection with other viruses. If result is PRESUMPTIVE POSTIVE SARS-CoV-2 nucleic acids MAY BE PRESENT.   A presumptive positive result was obtained on the submitted specimen  and confirmed on repeat testing.  While 2019 novel coronavirus  (SARS-CoV-2) nucleic acids may be present in the submitted sample  additional confirmatory testing may be necessary for epidemiological  and / or clinical management purposes  to differentiate between  SARS-CoV-2 and other Sarbecovirus currently known to infect humans.  If clinically indicated additional testing with an alternate test  methodology [phone]) is advised. The SARS-CoV-2 RNA is generally  detectable in upper and lower respiratory sp ecimens during the acute  phase of infection. The expected result is Negative. Fact Sheet for Patients:  StrictlyIdeas.no Fact Sheet for Healthcare Providers: BankingDealers.co.za This test is not yet approved or cleared by the [place] FDA and has been authorized for detection and/or diagnosis of SARS-CoV-2 by FDA under an Emergency Use Authorization (EUA).  This EUA will remain in effect (meaning this test can be used) for the duration of the COVID-19 declaration under Section 564(b)(1) of the Act, 21 U.S.C. section 360bbb-3(b)(1), unless the  authorization is terminated or revoked sooner. Performed at [facility], [place] [address] [place], [place] [address]   Basic metabolic panel     Status: Abnormal   Collection Time: 12/24/18 11:44 PM  Result Value Ref Range   Sodium 140 135 - 145 mmol/L   Potassium 4.5 3.5 - 5.1 mmol/L   Chloride 104 98 - 111 mmol/L   CO2 26 22 - 32 mmol/L   Glucose, Bld 218 (H) 70 - 99 mg/dL   BUN 19 8 - 23 mg/dL   Creatinine, Ser 1.13 0.61 - 1.24 mg/dL   Calcium 9.2 8.9 - 10.3 mg/dL   GFR calc non Af Amer >60 >60 mL/min   GFR calc Af Amer >60 >60 mL/min   Anion gap 10 5 - 15    Comment: Performed at [facility] [address] [place], [place] [address]  Brain natriuretic peptide     Status: None   Collection Time: 12/24/18 11:44 PM  Result Value Ref Range   B Natriuretic Peptide 47.6 0.0 - 100.0 pg/mL    Comment: Performed at [facility] [address] [place], [place] [address]  Troponin I - Once     Status: None  Collection Time: 12/24/18 11:44 PM  Result Value Ref Range   Troponin I <0.03 <0.03 ng/mL    Comment: Performed at [facility], [place] [address], [place], [place] [address]  CBC with Differential     Status: Abnormal   Collection Time: 12/24/18 11:44 PM  Result Value Ref Range   WBC 3.8 (L) 4.0 - 10.5 K/uL   RBC 3.82 (L) 4.22 - 5.81 MIL/uL   Hemoglobin 11.5 (L) 13.0 - 17.0 g/dL   HCT 36.3 (L) 39.0 - 52.0 %   MCV 95.0 80.0 - 100.0 fL   MCH 30.1 26.0 - 34.0 pg   MCHC 31.7 30.0 - 36.0 g/dL   RDW 13.9 11.5 - 15.5 %   Platelets 159 150 - 400 K/uL   nRBC 0.0 0.0 - 0.2 %   Neutrophils Relative % 73 %   Neutro Abs 2.7 1.7 - 7.7 K/uL   Lymphocytes Relative 12 %   Lymphs Abs 0.5 (L) 0.7 - 4.0 K/uL   Monocytes Relative 12 %   Monocytes Absolute 0.5 0.1 - 1.0 K/uL   Eosinophils Relative 0 %   Eosinophils Absolute 0.0 0.0 - 0.5 K/uL   Basophils Relative 0 %   Basophils Absolute 0.0 0.0 - 0.1 K/uL   Immature Granulocytes 3 %   Abs Immature Granulocytes  0.10 (H) 0.00 - 0.07 K/uL    Comment: Performed at [facility], [address], [place], [place] [address]  D-dimer, quantitative     Status: Abnormal   Collection Time: 12/24/18 11:44 PM  Result Value Ref Range   D-Dimer, Quant 2.50 (H) 0.00 - 0.50 ug/mL-FEU    Comment: (NOTE) At the manufacturer cut-off of 0.50 ug/mL FEU, this assay has been documented to exclude PE with a sensitivity and negative predictive value of 97 to 99%.  At this time, this assay has not been approved by the FDA to exclude DVT/VTE. Results should be correlated with clinical presentation. Performed at [facility], [place] [address] [place], [place] [address]    Ct Angio Chest Pe W And/or Wo Contrast  Result Date: 12/25/2018 CLINICAL DATA:  78 year old male with sudden onset shortness of breath and productive cough. EXAM: CT ANGIOGRAPHY CHEST WITH CONTRAST TECHNIQUE: Multidetector CT imaging of the chest was performed using the standard protocol during bolus administration of intravenous contrast. Multiplanar CT image reconstructions and MIPs were obtained to evaluate the vascular anatomy. CONTRAST:  67mL OMNIPAQUE IOHEXOL 350 MG/ML SOLN COMPARISON:  Portable chest 12/24/2018 and earlier. Chest CTA 04/10/2016. FINDINGS: Cardiovascular: Adequate contrast bolus timing in the pulmonary arterial tree. Respiratory motion most pronounced in the lower lobes. No focal filling defect identified in the pulmonary arteries to suggest acute pulmonary embolism. Calcified aortic atherosclerosis. Calcified coronary artery atherosclerosis. No cardiomegaly or pericardial effusion. Mediastinum/Nodes: Mild, reactive appearing hilar lymph nodes. No mediastinal lymphadenopathy. Lungs/Pleura: Chronic emphysema. Major airways are patent but there is lower lobe peribronchial thickening greater on the left. There is left lower lobe consolidation mildly sparing the superior segment. Superimposed small layering left pleural effusion. There  is a smaller area of peribronchial consolidation in the right lower lobe, with superimposed progressive lateral basal segment bullous emphysema since 2017. No right pleural effusion. upper abdomen: negative visible liver, spleen, pancreas, adrenal glands and bowel in the upper abdomen. partially visible simple fluid density upper pole right renal cyst. Musculoskeletal: No acute osseous abnormality identified. Review of the MIP images confirms the above findings. IMPRESSION: 1. No evidence of acute  pulmonary embolus. 2. Emphysema (ICD10-J43.9) with left greater than right lower lobe Pneumonia. Small layering left pleural effusion. Mild reactive hilar lymph nodes. 3. Calcified coronary artery and Aortic Atherosclerosis (ICD10-I70.0). Electronically Signed   By: Genevie Ann M.D.   On: 12/25/2018 03:24   Dg Chest Port 1 View  Result Date: 12/24/2018 CLINICAL DATA:  Shortness of breath EXAM: PORTABLE CHEST 1 VIEW COMPARISON:  Chest x-ray dated 12/12/2018 FINDINGS: The heart size is again enlarged. The lungs are hyperexpanded. There appears to be a new retrocardiac opacity. There is no pneumothorax. There is some mild blunting of the right costophrenic angle. There is no acute osseous abnormality. IMPRESSION: 1. New retrocardiac opacity concerning for aspiration or pneumonia. 2. COPD 3. Stable cardiomegaly. Electronically Signed   By: Constance Holster M.D.   On: 12/24/2018 23:49    Pending Labs Unresulted Labs (From admission, onward)    Start     Ordered   12/25/18 0500  CBC  Tomorrow morning,   R     12/25/18 0456   12/25/18 0455  HIV antibody (Routine Screening)  Once,   R     12/25/18 0456          Vitals/Pain Today's Vitals   12/25/18 0215 12/25/18 0236 12/25/18 0345 12/25/18 0415  BP: (!) 167/84  (!) 158/85 (!) 147/84  Pulse: 99  94 100  Resp: (!) 27  (!) 23 (!) 26  Temp:      TempSrc:      SpO2: 96%  96% 94%  Weight:      Height:      PainSc:  0-No pain      Isolation Precautions No  active isolations  Medications Medications  cefTRIAXone (ROCEPHIN) 1 g in sodium chloride 0.9 % 100 mL IVPB (has no administration in time range)  azithromycin (ZITHROMAX) 500 mg in sodium chloride 0.9 % 250 mL IVPB (has no administration in time range)  losartan (COZAAR) tablet 50 mg (has no administration in time range)  simvastatin (ZOCOR) tablet 40 mg (has no administration in time range)  vitamin B-12 (CYANOCOBALAMIN) tablet 100 mcg (has no administration in time range)  cholecalciferol (VITAMIN D) tablet 1,000 Units (has no administration in time range)  Fluticasone-Umeclidin-Vilant 100-62.5-25 MCG/INH AEPB 1 puff (has no administration in time range)  ipratropium-albuterol (DUONEB) 0.5-2.5 (3) MG/3ML nebulizer solution 3 mL (has no administration in time range)  enoxaparin (LOVENOX) injection 40 mg (has no administration in time range)  iohexol (OMNIPAQUE) 350 MG/ML injection 75 mL (75 mLs Intravenous Contrast Given 12/25/18 0305)  cefTRIAXone (ROCEPHIN) 1 g in sodium chloride 0.9 % 100 mL IVPB (0 g Intravenous Stopped 12/25/18 0502)  azithromycin (ZITHROMAX) 500 mg in sodium chloride 0.9 % 250 mL IVPB (0 mg Intravenous Stopped 12/25/18 0502)    Mobility walks Low fall risk   Focused Assessments Pulmonary Assessment Handoff:  Lung sounds: Bilateral Breath Sounds: Diminished L Breath Sounds: Diminished R Breath Sounds: Diminished O2 Device: Nasal Cannula O2 Flow Rate (L/min): 4 L/min      R Recommendations: See Admitting Provider Note  Report given to:   Additional Notes: N/A

## 2018-12-25 NOTE — Evaluation (Signed)
Physical Therapy Evaluation Patient Details Name: Cory Kemp MRN: [id number] DOB: 1941/07/25 Today's Date: 12/25/2018   History of Present Illness  Pt is a 78 y.o. male admitted 12/24/18 with worsening SOB. CXR concerning for PNA. CT angiogram without evidence of acute PE. COVID-19 rapid test negative. PMH includes COPD, DM2, HTN, anemia.    Clinical Impression  Patient evaluated by Physical Therapy with no further acute PT needs identified. PTA, pt indep and lives with roommate; remains active and enjoys traveling. Today, pt indep with mobility; good awareness to self-monitor activty tolerance and uses pulse ox from home. SpO2 91-94% on 3L O2 [place] with ambulation. All education has been completed and the patient has no further questions. Acute PT is signing off. Thank you for this referral.     Follow Up Recommendations No PT follow up    Equipment Recommendations  None recommended by PT    Recommendations for Other Services       Precautions / Restrictions Precautions Precautions: None Restrictions Weight Bearing Restrictions: No      Mobility  Bed Mobility Overal bed mobility: Independent                Transfers Overall transfer level: Independent                  Ambulation/Gait Ambulation/Gait assistance: Independent Gait Distance (Feet): 350 Feet Assistive device: None Gait Pattern/deviations: Step-through pattern;Decreased stride length Gait velocity: Decreased Gait velocity interpretation: 1.31 - 2.62 ft/sec, indicative of limited community ambulator General Gait Details: Slow, steady gait; pt indep to push O2 tank. SpO2 91-94% on 3L O2   Stairs Stairs: (Pt declined)          Wheelchair Mobility    Modified Rankin (Stroke Patients Only)       Balance Overall balance assessment: No apparent balance deficits (not formally assessed)                                           Pertinent Vitals/Pain Pain Assessment:  No/denies pain    Home Living Family/patient expects to be discharged to:: Private residence Living Arrangements: Non-relatives/Friends Available Help at Discharge: Friend(s) Type of Home: Mobile home Home Access: Stairs to enter Entrance Stairs-Rails: Right Entrance Stairs-Number of Steps: 4-5 Home Layout: One level Home Equipment: None Additional Comments: Lives with roommate    Prior Function Level of Independence: Independent         Comments: Drives. Enjoys traveling (drives cross-country). Wears 3-4L O2; self-monitors with pulse ox     Hand Dominance        Extremity/Trunk Assessment   Upper Extremity Assessment Upper Extremity Assessment: Overall WFL for tasks assessed    Lower Extremity Assessment Lower Extremity Assessment: Overall WFL for tasks assessed    Cervical / Trunk Assessment Cervical / Trunk Assessment: Normal  Communication   Communication: No difficulties  Cognition Arousal/Alertness: Awake/alert Behavior During Therapy: WFL for tasks assessed/performed Overall Cognitive Status: Within Functional Limits for tasks assessed                                        General Comments General comments (skin integrity, edema, etc.): Good awareness of energy conservation strategies    Exercises     Assessment/Plan    PT Assessment Patent does not  need any further PT services  PT Problem List         PT Treatment Interventions      PT Goals (Current goals can be found in the Care Plan section)  Acute Rehab PT Goals PT Goal Formulation: All assessment and education complete, DC therapy    Frequency     Barriers to discharge        Co-evaluation               AM-PAC PT "6 Clicks" Mobility  Outcome Measure Help needed turning from your back to your side while in a flat bed without using bedrails?: None Help needed moving from lying on your back to sitting on the side of a flat bed without using bedrails?:  None Help needed moving to and from a bed to a chair (including a wheelchair)?: None Help needed standing up from a chair using your arms (e.g., wheelchair or bedside chair)?: None Help needed to walk in hospital room?: None Help needed climbing 3-5 steps with a railing? : None 6 Click Score: 24    End of Session Equipment Utilized During Treatment: Oxygen Activity Tolerance: Patient tolerated treatment well Patient left: in chair;with call bell/phone within reach Nurse Communication: Mobility status PT Visit Diagnosis: Other abnormalities of gait and mobility (R26.89)    Time: 1031-2811 PT Time Calculation (min) (ACUTE ONLY): 20 min   Charges:   PT Evaluation $PT Eval Moderate Complexity: [place], PT, DPT Acute Rehabilitation Services  Pager [phone] Office [place] 12/25/2018, 10:32 AM

## 2018-12-25 NOTE — ED Notes (Cosign Needed)
Pt contacted daughter with update of status.

## 2018-12-25 NOTE — Progress Notes (Signed)
Bilateral lower extremity venous duplex has been completed. Preliminary results can be found in CV Proc through chart review.   12/25/18 11:02 AM Cory Kemp RVT

## 2018-12-25 NOTE — ED Notes (Signed)
Pt to CT

## 2018-12-26 LAB — BASIC METABOLIC PANEL
Anion gap: 10 (ref 5–15)
BUN: 19 mg/dL (ref 8–23)
CO2: 30 mmol/L (ref 22–32)
Calcium: 9.1 mg/dL (ref 8.9–10.3)
Chloride: 103 mmol/L (ref 98–111)
Creatinine, Ser: 0.84 mg/dL (ref 0.61–1.24)
GFR calc Af Amer: >60 mL/min (ref >60)
GFR calc non Af Amer: >60 mL/min (ref >60)
Glucose, Bld: 95 mg/dL (ref 70–99)
Potassium: 3.9 mmol/L (ref 3.5–5.1)
Sodium: 143 mmol/L (ref 135–145)

## 2018-12-26 LAB — CBC
HCT: 35.4 % — ABNORMAL LOW (ref 39.0–52.0)
Hemoglobin: 11.5 g/dL — ABNORMAL LOW (ref 13.0–17.0)
MCH: 30.5 pg (ref 26.0–34.0)
MCHC: 32.5 g/dL (ref 30.0–36.0)
MCV: 93.9 fL (ref 80.0–100.0)
Platelets: 178 10*3/uL (ref 150–400)
RBC: 3.77 MIL/uL — ABNORMAL LOW (ref 4.22–5.81)
RDW: 13.9 % (ref 11.5–15.5)
WBC: 4.4 10*3/uL (ref 4.0–10.5)
nRBC: 0 % (ref 0.0–0.2)

## 2018-12-26 MED ORDER — AMOXICILLIN-POT CLAVULANATE 875-125 MG PO TABS: 1.0000 | ORAL_TABLET | Freq: Two times a day (BID) | ORAL | 0 refills | Status: AC

## 2018-12-26 MED ORDER — DOXYCYCLINE HYCLATE 100 MG PO TABS: 100.0000 mg | ORAL_TABLET | Freq: Two times a day (BID) | ORAL | 0 refills | Status: DC

## 2018-12-26 MED FILL — DOXYCYCLINE HYCLATE 100 MG: 100 | 5 days supply | Qty: 10 | Fill #0

## 2018-12-26 MED FILL — AMOX-CLAV 875-125 MG TABLET: 875-125 | 5 days supply | Qty: 10 | Fill #0

## 2018-12-26 NOTE — Progress Notes (Signed)
OT Cancellation Note  Patient Details Name: Cory Kemp MRN: [id number] DOB: 04-06-1941   Cancelled Treatment:    Reason Eval/Treat Not Completed: OT screened, no needs identified, will sign off.  Per PT, pt moving well, has been perfoming ADLs.    Lucille Passy, OTR/L Acute Rehabilitation Services Pager [phone] Office [phone]   Lucille Passy M 12/26/2018, 6:17 AM

## 2018-12-26 NOTE — Discharge Summary (Signed)
Physician Discharge Summary  DELSIN COPEN NKN:[id number] DOB: 04-24-1941 DOA: 12/24/2018  PCP: Jani Gravel, MD  Admit date: 12/24/2018 Discharge date: 12/26/2018  Admitted From: Home  Disposition:  Home   Recommendations for Outpatient Follow-up:  1. Follow up with PCP in 1-2 weeks 2. Please obtain BMP/CBC in one week 3. Chest x ray in 5 weeks to follow resolution infiltrates.  4. Needs future evaluation of iron deficiency anemia   Discharge Condition: stable.  CODE STATUS: full code Diet recommendation: Heart Healthy  Brief/Interim Summary: Cory Kemp is a 78 y.o. male with medical history significant of COPD, type 2 diabetes, hypertension, hyperlipidemia, anemia, and conditions listed below presenting to the hospital via EMS for evaluation of shortness of breath and cough.  Per EMS, SPO2 69% on room air and patient was placed on a nonrebreather.  Tachycardic with heart rate ranging from 100-150.  Tachypneic with respiratory rate 24.  Blood pressure 165/62.  Patient states he has COPD but for the past few days has been feeling increasingly short of breath, fatigued, and has been coughing.  Cough is productive of clear/yellow sputum.  He has not been wheezing much.  Denies any fevers or chills.  Denies any chest pain, abdominal pain, nausea, vomiting, or diarrhea.  He quit smoking in January.  States he uses 3 to 4 L oxygen at home all the time.  ED Course: Afebrile, tachycardic and tachypneic.  Requiring 4 L supplemental oxygen in the ED.  White count 3.8, absolute lymphocyte count 0.5.[name] Kitchen  Hemoglobin 11.5, was 13.5 a year ago.  Blood glucose 218.  BNP normal.  COVID-19 rapid test negative.  Troponin negative.  D-dimer elevated at 2.5.  Chest x-ray (personally reviewed) showing new retrocardiac opacity concerning for pneumonia.  CT angiogram without evidence of acute PE.  Showing emphysema with left greater than right lower lobe pneumonia.  Small layering left pleural effusion.  Mild  reactive hilar lymph nodes.  Ceftriaxone and azithromycin administered in the ED.  1-Community-acquired pneumonia; patient admitted with tachypnea, tachycardia.  CT angios without evidence of acute PE.  Show emphysema and left greater than the right lower lobe pneumonia.  Call with test was negative. Patient was treated with IV ceftriaxone and azithromycin.  He had rapidly improvement of his symptoms. Back: 2.5 L of oxygen, his baseline. He will be discharged on doxycycline and Augmentin to complete 5 more days of antibiotics for pneumonia. Respiratory viral panel negative.  Acute on chronic hypoxic respiratory failure secondary to community-acquired pneumonia: On admission patient oxygen saturation was 69 on room air.  Required 4 L of oxygen on admission.  His oxygen level has improved and he has been able to titrate down to 2.5 L. He uses 3 L of oxygen at home.    Elevated d-dimer CT angios negative for PE.  Dopplers.  Defer DVT. COPD; continue with nebulizer.  Tension continue with losartan Iron deficiency anemia need to follow-up with PCP for further evaluation    Discharge Diagnoses:  Principal Problem:   CAP (community acquired pneumonia) Active Problems:   Diabetes mellitus ([place])   Normochromic anemia   COPD (chronic obstructive pulmonary disease) (HCC)   Acute on chronic respiratory failure with hypoxia (HCC)   Elevated d-dimer    Discharge Instructions  Discharge Instructions    Diet - low sodium heart healthy   Complete by:  As directed    Increase activity slowly   Complete by:  As directed      Allergies as of  12/26/2018      Reactions   Adhesive [tape] Rash      Medication List    STOP taking these medications   Aleve 220 MG Caps Generic drug:  Naproxen Sodium   azithromycin 250 MG tablet Commonly known as:  ZITHROMAX   predniSONE 10 MG tablet Commonly known as:  DELTASONE     TAKE these medications   albuterol (2.5 MG/3ML) 0.083% nebulizer  solution Commonly known as:  PROVENTIL Take 3 mLs (2.5 mg total) by nebulization every 6 (six) hours as needed for wheezing or shortness of breath.   albuterol 108 (90 Base) MCG/ACT inhaler Commonly known as:  VENTOLIN HFA Inhale 2 puffs into the lungs every 4 (four) hours as needed for wheezing or shortness of breath.   amoxicillin-clavulanate 875-125 MG tablet Commonly known as:  Augmentin Take 1 tablet by mouth 2 (two) times daily for 5 days.   cholecalciferol 1000 units tablet Commonly known as:  VITAMIN D Take 1,000 Units by mouth at bedtime.   cyclobenzaprine 10 MG tablet Commonly known as:  FLEXERIL Take 1 tablet (10 mg total) by mouth 2 (two) times daily as needed for muscle spasms.   diclofenac sodium 1 % Gel Commonly known as:  VOLTAREN Apply 4 g topically 4 (four) times daily. What changed:    when to take this  reasons to take this   doxycycline 100 MG tablet Commonly known as:  VIBRA-TABS Take 1 tablet (100 mg total) by mouth every 12 (twelve) hours.   Fluticasone-Umeclidin-Vilant 100-62.5-25 MCG/INH Aepb Commonly known as:  Trelegy Ellipta Inhale 1 puff into the lungs daily. What changed:  Another medication with the same name was removed. Continue taking this medication, and follow the directions you see here.   losartan 50 MG tablet Commonly known as:  COZAAR Take 50 mg by mouth at bedtime.   simvastatin 40 MG tablet Commonly known as:  ZOCOR Take 40 mg by mouth at bedtime.   vitamin B-12 100 MCG tablet Commonly known as:  CYANOCOBALAMIN Take 100 mcg by mouth at bedtime.       Allergies  Allergen Reactions  . Adhesive [Tape] Rash    Consultations:  None   Procedures/Studies: Dg Chest 2 View  Result Date: 12/12/2018 CLINICAL DATA:  Neck pain. Shortness of breath. EXAM: CHEST - 2 VIEW COMPARISON:  01/03/2018. FINDINGS: Heart size is increased. Calcified tortuous aorta. Hyperinflation. RIGHT infrahilar scarring, similar to priors. No  definite consolidation or edema. No effusion or pneumothorax. Bones are unremarkable. IMPRESSION: Cardiomegaly.  COPD.  No active disease. Electronically Signed   By: Staci Righter M.D.   On: 12/12/2018 13:05   Ct Angio Chest Pe W And/or Wo Contrast  Result Date: 12/25/2018 CLINICAL DATA:  78 year old male with sudden onset shortness of breath and productive cough. EXAM: CT ANGIOGRAPHY CHEST WITH CONTRAST TECHNIQUE: Multidetector CT imaging of the chest was performed using the standard protocol during bolus administration of intravenous contrast. Multiplanar CT image reconstructions and MIPs were obtained to evaluate the vascular anatomy. CONTRAST:  11mL OMNIPAQUE IOHEXOL 350 MG/ML SOLN COMPARISON:  Portable chest 12/24/2018 and earlier. Chest CTA 04/10/2016. FINDINGS: Cardiovascular: Adequate contrast bolus timing in the pulmonary arterial tree. Respiratory motion most pronounced in the lower lobes. No focal filling defect identified in the pulmonary arteries to suggest acute pulmonary embolism. Calcified aortic atherosclerosis. Calcified coronary artery atherosclerosis. No cardiomegaly or pericardial effusion. Mediastinum/Nodes: Mild, reactive appearing hilar lymph nodes. No mediastinal lymphadenopathy. Lungs/Pleura: Chronic emphysema. Major airways are patent but  there is lower lobe peribronchial thickening greater on the left. There is left lower lobe consolidation mildly sparing the superior segment. Superimposed small layering left pleural effusion. There is a smaller area of peribronchial consolidation in the right lower lobe, with superimposed progressive lateral basal segment bullous emphysema since 2017. No right pleural effusion. upper abdomen: negative visible liver, spleen, pancreas, adrenal glands and bowel in the upper abdomen. partially visible simple fluid density upper pole right renal cyst. Musculoskeletal: No acute osseous abnormality identified. Review of the MIP images confirms the above  findings. IMPRESSION: 1. No evidence of acute pulmonary embolus. 2. Emphysema (ICD10-J43.9) with left greater than right lower lobe Pneumonia. Small layering left pleural effusion. Mild reactive hilar lymph nodes. 3. Calcified coronary artery and Aortic Atherosclerosis (ICD10-I70.0). Electronically Signed   By: Genevie Ann M.D.   On: 12/25/2018 03:24   Dg Chest Port 1 View  Result Date: 12/24/2018 CLINICAL DATA:  Shortness of breath EXAM: PORTABLE CHEST 1 VIEW COMPARISON:  Chest x-ray dated 12/12/2018 FINDINGS: The heart size is again enlarged. The lungs are hyperexpanded. There appears to be a new retrocardiac opacity. There is no pneumothorax. There is some mild blunting of the right costophrenic angle. There is no acute osseous abnormality. IMPRESSION: 1. New retrocardiac opacity concerning for aspiration or pneumonia. 2. COPD 3. Stable cardiomegaly. Electronically Signed   By: Constance Holster M.D.   On: 12/24/2018 23:49   Vas [place] Lower Extremity Venous (dvt)  Result Date: 12/25/2018  Lower Venous Study Indications: Elevated Ddimer.  Performing Technologist: Oliver Hum RVT  Examination Guidelines: A complete evaluation includes B-mode imaging, spectral Doppler, color Doppler, and power Doppler as needed of all accessible portions of each vessel. Bilateral testing is considered an integral part of a complete examination. Limited examinations for reoccurring indications may be performed as noted.  +---------+---------------+---------+-----------+----------+-------+ RIGHT    CompressibilityPhasicitySpontaneityPropertiesSummary +---------+---------------+---------+-----------+----------+-------+ CFV      Full           Yes      Yes                          +---------+---------------+---------+-----------+----------+-------+ SFJ      Full                                                 +---------+---------------+---------+-----------+----------+-------+ FV Prox  Full                                                  +---------+---------------+---------+-----------+----------+-------+ FV Mid   Full                                                 +---------+---------------+---------+-----------+----------+-------+ FV DistalFull                                                 +---------+---------------+---------+-----------+----------+-------+ PFV      Full                                                 +---------+---------------+---------+-----------+----------+-------+  POP      Full           Yes      Yes                          +---------+---------------+---------+-----------+----------+-------+ PTV      Full                                                 +---------+---------------+---------+-----------+----------+-------+ PERO     Full                                                 +---------+---------------+---------+-----------+----------+-------+   +---------+---------------+---------+-----------+----------+-------+ LEFT     CompressibilityPhasicitySpontaneityPropertiesSummary +---------+---------------+---------+-----------+----------+-------+ CFV      Full           Yes      Yes                          +---------+---------------+---------+-----------+----------+-------+ SFJ      Full                                                 +---------+---------------+---------+-----------+----------+-------+ FV Prox  Full                                                 +---------+---------------+---------+-----------+----------+-------+ FV Mid   Full                                                 +---------+---------------+---------+-----------+----------+-------+ FV DistalFull                                                 +---------+---------------+---------+-----------+----------+-------+ PFV      Full                                                  +---------+---------------+---------+-----------+----------+-------+ POP      Full           Yes      Yes                          +---------+---------------+---------+-----------+----------+-------+ PTV      Full                                                 +---------+---------------+---------+-----------+----------+-------+ PERO  Full                                                 +---------+---------------+---------+-----------+----------+-------+     Summary: Right: There is no evidence of deep vein thrombosis in the lower extremity. No cystic structure found in the popliteal fossa. Left: There is no evidence of deep vein thrombosis in the lower extremity. No cystic structure found in the popliteal fossa.  *See table(s) above for measurements and observations. Electronically signed by Harold Barban MD on 12/25/2018 at 2:50:05 PM.    Final       Subjective: He is breathing better, cough has improved.  He feels better he thinks he can go home.  Discharge Exam: Vitals:   12/26/18 0754 12/26/18 0920  BP: (!) 150/81   Pulse: 90   Resp:    Temp: 97.7 F (36.5 C)   SpO2: 93% 94%     General: Pt is alert, awake, not in acute distress Cardiovascular: RRR, S1/S2 +, no rubs, no gallops Respiratory: CTA bilaterally, no wheezing, no rhonchi Abdominal: Soft, NT, ND, bowel sounds + Extremities: no edema, no cyanosis    The results of significant diagnostics from this hospitalization (including imaging, microbiology, ancillary and laboratory) are listed below for reference.     Microbiology: Recent Results (from the past 240 hour(s))  SARS Coronavirus 2 [facility] order, Performed in [facility] hospital lab)     Status: None   Collection Time: 12/24/18 11:44 PM  Result Value Ref Range Status   SARS Coronavirus 2 NEGATIVE NEGATIVE Final    Comment: (NOTE) If result is NEGATIVE SARS-CoV-2 target nucleic acids are NOT DETECTED. The SARS-CoV-2 RNA is generally  detectable in upper and lower  respiratory specimens during the acute phase of infection. The lowest  concentration of SARS-CoV-2 viral copies this assay can detect is 250  copies / mL. A negative result does not preclude SARS-CoV-2 infection  and should not be used as the sole basis for treatment or other  patient management decisions.  A negative result may occur with  improper specimen collection / handling, submission of specimen other  than nasopharyngeal swab, presence of viral mutation(s) within the  areas targeted by this assay, and inadequate number of viral copies  (<250 copies / mL). A negative result must be combined with clinical  observations, patient history, and epidemiological information. If result is POSITIVE SARS-CoV-2 target nucleic acids are DETECTED. The SARS-CoV-2 RNA is generally detectable in upper and lower  respiratory specimens dur ing the acute phase of infection.  Positive  results are indicative of active infection with SARS-CoV-2.  Clinical  correlation with patient history and other diagnostic information is  necessary to determine patient infection status.  Positive results do  not rule out bacterial infection or co-infection with other viruses. If result is PRESUMPTIVE POSTIVE SARS-CoV-2 nucleic acids MAY BE PRESENT.   A presumptive positive result was obtained on the submitted specimen  and confirmed on repeat testing.  While 2019 novel coronavirus  (SARS-CoV-2) nucleic acids may be present in the submitted sample  additional confirmatory testing may be necessary for epidemiological  and / or clinical management purposes  to differentiate between  SARS-CoV-2 and other Sarbecovirus currently known to infect humans.  If clinically indicated additional testing with an alternate test  methodology [phone] is advised. The SARS-CoV-2 RNA is generally  detectable in upper and lower respiratory sp ecimens during the acute  phase of infection. The  expected result is Negative. Fact Sheet for Patients:  StrictlyIdeas.no Fact Sheet for Healthcare Providers: BankingDealers.co.za This test is not yet approved or cleared by the [place] FDA and has been authorized for detection and/or diagnosis of SARS-CoV-2 by FDA under an Emergency Use Authorization (EUA).  This EUA will remain in effect (meaning this test can be used) for the duration of the COVID-19 declaration under Section 564(b)(1) of the Act, 21 U.S.C. section 360bbb-3(b)(1), unless the authorization is terminated or revoked sooner. Performed at [facility], [place] [address] [place], [place] [address]   Respiratory Panel by PCR     Status: None   Collection Time: 12/25/18  7:57 AM  Result Value Ref Range Status   Adenovirus NOT DETECTED NOT DETECTED Final   Coronavirus 229E NOT DETECTED NOT DETECTED Final    Comment: (NOTE) The Coronavirus on the Respiratory Panel, DOES NOT test for the novel  Coronavirus (2019 nCoV)    Coronavirus HKU1 NOT DETECTED NOT DETECTED Final   Coronavirus NL63 NOT DETECTED NOT DETECTED Final   Coronavirus OC43 NOT DETECTED NOT DETECTED Final   Metapneumovirus NOT DETECTED NOT DETECTED Final   Rhinovirus / Enterovirus NOT DETECTED NOT DETECTED Final   Influenza A NOT DETECTED NOT DETECTED Final   Influenza B NOT DETECTED NOT DETECTED Final   Parainfluenza Virus 1 NOT DETECTED NOT DETECTED Final   Parainfluenza Virus 2 NOT DETECTED NOT DETECTED Final   Parainfluenza Virus 3 NOT DETECTED NOT DETECTED Final   Parainfluenza Virus 4 NOT DETECTED NOT DETECTED Final   Respiratory Syncytial Virus NOT DETECTED NOT DETECTED Final   Bordetella pertussis NOT DETECTED NOT DETECTED Final   Chlamydophila pneumoniae NOT DETECTED NOT DETECTED Final   Mycoplasma pneumoniae NOT DETECTED NOT DETECTED Final    Comment: Performed at [facility], [place]. [address] [place], [place] [address]      Labs: BNP (last 3 results) Recent Labs    12/24/18 2344  BNP 81.2   Basic Metabolic Panel: Recent Labs  Lab 12/24/18 2344 12/26/18 0755  NA 140 143  K 4.5 3.9  CL 104 103  CO2 26 30  GLUCOSE 218* 95  BUN 19 19  CREATININE 1.13 0.84  CALCIUM 9.2 9.1   Liver Function Tests: No results for input(s): AST, ALT, ALKPHOS, BILITOT, PROT, ALBUMIN in the last 168 hours. No results for input(s): LIPASE, AMYLASE in the last 168 hours. No results for input(s): AMMONIA in the last 168 hours. CBC: Recent Labs  Lab 12/24/18 2344 12/25/18 0509 12/26/18 0755  WBC 3.8* 2.6* 4.4  NEUTROABS 2.7  --   --   HGB 11.5* 11.2* 11.5*  HCT 36.3* 35.4* 35.4*  MCV 95.0 95.2 93.9  PLT 159 161 178   Cardiac Enzymes: Recent Labs  Lab 12/24/18 2344  TROPONINI <0.03   BNP: Invalid input(s): POCBNP CBG: Recent Labs  Lab 12/25/18 0752 12/25/18 1119 12/25/18 1623  GLUCAP 134* 122* 159*   D-Dimer Recent Labs    12/24/18 2344  DDIMER 2.50*   Hgb A1c Recent Labs    12/25/18 0647  HGBA1C 5.7*   Lipid Profile No results for input(s): CHOL, HDL, LDLCALC, TRIG, CHOLHDL, LDLDIRECT in the last 72 hours. Thyroid function studies No results for input(s): TSH, T4TOTAL, T3FREE, THYROIDAB in the last 72 hours.  Invalid input(s): FREET3 Anemia work up [employer]    12/25/18 [id number]  FERRITIN 54  TIBC 283  IRON 28*   Urinalysis    Component Value Date/Time   COLORURINE YELLOW 07/22/2013 0725   APPEARANCEUR CLEAR 07/22/2013 0725   LABSPEC 1.011 07/22/2013 0725   PHURINE 6.5 07/22/2013 0725   GLUCOSEU NEGATIVE 07/22/2013 0725   HGBUR TRACE (A) 07/22/2013 0725   BILIRUBINUR NEGATIVE 07/22/2013 0725   KETONESUR NEGATIVE 07/22/2013 0725   PROTEINUR NEGATIVE 07/22/2013 0725   UROBILINOGEN 0.2 07/22/2013 0725   NITRITE NEGATIVE 07/22/2013 0725   LEUKOCYTESUR NEGATIVE 07/22/2013 0725   Sepsis Labs Invalid input(s): PROCALCITONIN,  WBC,  LACTICIDVEN Microbiology Recent Results  (from the past 240 hour(s))  SARS Coronavirus 2 [facility] order, Performed in [place] hospital lab)     Status: None   Collection Time: 12/24/18 11:44 PM  Result Value Ref Range Status   SARS Coronavirus 2 NEGATIVE NEGATIVE Final    Comment: (NOTE) If result is NEGATIVE SARS-CoV-2 target nucleic acids are NOT DETECTED. The SARS-CoV-2 RNA is generally detectable in upper and lower  respiratory specimens during the acute phase of infection. The lowest  concentration of SARS-CoV-2 viral copies this assay can detect is 250  copies / mL. A negative result does not preclude SARS-CoV-2 infection  and should not be used as the sole basis for treatment or other  patient management decisions.  A negative result may occur with  improper specimen collection / handling, submission of specimen other  than nasopharyngeal swab, presence of viral mutation(s) within the  areas targeted by this assay, and inadequate number of viral copies  (<250 copies / mL). A negative result must be combined with clinical  observations, patient history, and epidemiological information. If result is POSITIVE SARS-CoV-2 target nucleic acids are DETECTED. The SARS-CoV-2 RNA is generally detectable in upper and lower  respiratory specimens dur ing the acute phase of infection.  Positive  results are indicative of active infection with SARS-CoV-2.  Clinical  correlation with patient history and other diagnostic information is  necessary to determine patient infection status.  Positive results do  not rule out bacterial infection or co-infection with other viruses. If result is PRESUMPTIVE POSTIVE SARS-CoV-2 nucleic acids MAY BE PRESENT.   A presumptive positive result was obtained on the submitted specimen  and confirmed on repeat testing.  While 2019 novel coronavirus  (SARS-CoV-2) nucleic acids may be present in the submitted sample  additional confirmatory testing may be necessary for epidemiological  and / or  clinical management purposes  to differentiate between  SARS-CoV-2 and other Sarbecovirus currently known to infect humans.  If clinically indicated additional testing with an alternate test  methodology [phone]) is advised. The SARS-CoV-2 RNA is generally  detectable in upper and lower respiratory sp ecimens during the acute  phase of infection. The expected result is Negative. Fact Sheet for Patients:  StrictlyIdeas.no Fact Sheet for Healthcare Providers: BankingDealers.co.za This test is not yet approved or cleared by the [place] FDA and has been authorized for detection and/or diagnosis of SARS-CoV-2 by FDA under an Emergency Use Authorization (EUA).  This EUA will remain in effect (meaning this test can be used) for the duration of the COVID-19 declaration under Section 564(b)(1) of the Act, 21 U.S.C. section 360bbb-3(b)(1), unless the authorization is terminated or revoked sooner. Performed at [facility], [place] [address], [place], [place] [address]   Respiratory Panel by PCR     Status: None   Collection Time: 12/25/18  7:57 AM  Result Value Ref Range Status  Adenovirus NOT DETECTED NOT DETECTED Final   Coronavirus 229E NOT DETECTED NOT DETECTED Final    Comment: (NOTE) The Coronavirus on the Respiratory Panel, DOES NOT test for the novel  Coronavirus (2019 nCoV)    Coronavirus HKU1 NOT DETECTED NOT DETECTED Final   Coronavirus NL63 NOT DETECTED NOT DETECTED Final   Coronavirus OC43 NOT DETECTED NOT DETECTED Final   Metapneumovirus NOT DETECTED NOT DETECTED Final   Rhinovirus / Enterovirus NOT DETECTED NOT DETECTED Final   Influenza A NOT DETECTED NOT DETECTED Final   Influenza B NOT DETECTED NOT DETECTED Final   Parainfluenza Virus 1 NOT DETECTED NOT DETECTED Final   Parainfluenza Virus 2 NOT DETECTED NOT DETECTED Final   Parainfluenza Virus 3 NOT DETECTED NOT DETECTED Final   Parainfluenza Virus 4 NOT  DETECTED NOT DETECTED Final   Respiratory Syncytial Virus NOT DETECTED NOT DETECTED Final   Bordetella pertussis NOT DETECTED NOT DETECTED Final   Chlamydophila pneumoniae NOT DETECTED NOT DETECTED Final   Mycoplasma pneumoniae NOT DETECTED NOT DETECTED Final    Comment: Performed at [facility], [place] [address] [place], [place] [address]     Time coordinating discharge: 40 minutes  SIGNED:   Elmarie [name], MD  [facility]

## 2019-01-01 DIAGNOSIS — J441 Chronic obstructive pulmonary disease with (acute) exacerbation: Secondary | ICD-10-CM | POA: Diagnosis not present

## 2019-01-01 DIAGNOSIS — J449 Chronic obstructive pulmonary disease, unspecified: Secondary | ICD-10-CM | POA: Diagnosis not present

## 2019-01-01 DIAGNOSIS — J189 Pneumonia, unspecified organism: Secondary | ICD-10-CM | POA: Diagnosis not present

## 2019-01-01 DIAGNOSIS — R69 Illness, unspecified: Secondary | ICD-10-CM | POA: Diagnosis not present

## 2019-01-01 DIAGNOSIS — Z7189 Other specified counseling: Secondary | ICD-10-CM | POA: Diagnosis not present

## 2019-01-06 DIAGNOSIS — J449 Chronic obstructive pulmonary disease, unspecified: Secondary | ICD-10-CM | POA: Diagnosis not present

## 2019-01-18 DIAGNOSIS — J441 Chronic obstructive pulmonary disease with (acute) exacerbation: Secondary | ICD-10-CM | POA: Diagnosis not present

## 2019-01-29 DIAGNOSIS — R69 Illness, unspecified: Secondary | ICD-10-CM | POA: Diagnosis not present

## 2019-02-05 DIAGNOSIS — J449 Chronic obstructive pulmonary disease, unspecified: Secondary | ICD-10-CM | POA: Diagnosis not present

## 2019-02-05 DIAGNOSIS — J441 Chronic obstructive pulmonary disease with (acute) exacerbation: Secondary | ICD-10-CM | POA: Diagnosis not present

## 2019-02-08 ENCOUNTER — Emergency Department (HOSPITAL_COMMUNITY): Payer: Medicare HMO

## 2019-02-08 ENCOUNTER — Observation Stay (HOSPITAL_BASED_OUTPATIENT_CLINIC_OR_DEPARTMENT_OTHER)
Admission: EM | Admit: 2019-02-08 | Discharge: 2019-02-09 | Disposition: A | Discharge status: Home / Self Care | Payer: Medicare HMO | Source: Home / Self Care | Attending: Emergency Medicine | Admitting: Emergency Medicine

## 2019-02-08 ENCOUNTER — Other Ambulatory Visit: Payer: Self-pay

## 2019-02-08 ENCOUNTER — Encounter (HOSPITAL_COMMUNITY): Payer: Self-pay | Admitting: Emergency Medicine

## 2019-02-08 DIAGNOSIS — E785 Hyperlipidemia, unspecified: Secondary | ICD-10-CM | POA: Insufficient documentation

## 2019-02-08 DIAGNOSIS — I160 Hypertensive urgency: Secondary | ICD-10-CM | POA: Diagnosis present

## 2019-02-08 DIAGNOSIS — Z87891 Personal history of nicotine dependence: Secondary | ICD-10-CM | POA: Insufficient documentation

## 2019-02-08 DIAGNOSIS — R0602 Shortness of breath: Secondary | ICD-10-CM | POA: Diagnosis not present

## 2019-02-08 DIAGNOSIS — F0390 Unspecified dementia without behavioral disturbance: Secondary | ICD-10-CM | POA: Insufficient documentation

## 2019-02-08 DIAGNOSIS — J9621 Acute and chronic respiratory failure with hypoxia: Secondary | ICD-10-CM | POA: Diagnosis present

## 2019-02-08 DIAGNOSIS — Z1159 Encounter for screening for other viral diseases: Secondary | ICD-10-CM | POA: Insufficient documentation

## 2019-02-08 DIAGNOSIS — Z791 Long term (current) use of non-steroidal anti-inflammatories (NSAID): Secondary | ICD-10-CM | POA: Insufficient documentation

## 2019-02-08 DIAGNOSIS — G894 Chronic pain syndrome: Secondary | ICD-10-CM | POA: Insufficient documentation

## 2019-02-08 DIAGNOSIS — E538 Deficiency of other specified B group vitamins: Secondary | ICD-10-CM | POA: Insufficient documentation

## 2019-02-08 DIAGNOSIS — I700 Atherosclerosis of aorta: Secondary | ICD-10-CM | POA: Insufficient documentation

## 2019-02-08 DIAGNOSIS — N400 Enlarged prostate without lower urinary tract symptoms: Secondary | ICD-10-CM | POA: Insufficient documentation

## 2019-02-08 DIAGNOSIS — J189 Pneumonia, unspecified organism: Secondary | ICD-10-CM | POA: Diagnosis not present

## 2019-02-08 DIAGNOSIS — Z79899 Other long term (current) drug therapy: Secondary | ICD-10-CM | POA: Insufficient documentation

## 2019-02-08 DIAGNOSIS — M8580 Other specified disorders of bone density and structure, unspecified site: Secondary | ICD-10-CM | POA: Insufficient documentation

## 2019-02-08 DIAGNOSIS — R000 Tachycardia, unspecified: Secondary | ICD-10-CM | POA: Diagnosis not present

## 2019-02-08 DIAGNOSIS — M1990 Unspecified osteoarthritis, unspecified site: Secondary | ICD-10-CM | POA: Insufficient documentation

## 2019-02-08 DIAGNOSIS — J441 Chronic obstructive pulmonary disease with (acute) exacerbation: Secondary | ICD-10-CM | POA: Insufficient documentation

## 2019-02-08 DIAGNOSIS — G4733 Obstructive sleep apnea (adult) (pediatric): Secondary | ICD-10-CM

## 2019-02-08 DIAGNOSIS — Z20828 Contact with and (suspected) exposure to other viral communicable diseases: Secondary | ICD-10-CM | POA: Diagnosis not present

## 2019-02-08 DIAGNOSIS — J9811 Atelectasis: Secondary | ICD-10-CM | POA: Diagnosis not present

## 2019-02-08 DIAGNOSIS — F329 Major depressive disorder, single episode, unspecified: Secondary | ICD-10-CM | POA: Insufficient documentation

## 2019-02-08 DIAGNOSIS — E119 Type 2 diabetes mellitus without complications: Secondary | ICD-10-CM | POA: Insufficient documentation

## 2019-02-08 DIAGNOSIS — Z7951 Long term (current) use of inhaled steroids: Secondary | ICD-10-CM | POA: Insufficient documentation

## 2019-02-08 LAB — CBC WITH DIFFERENTIAL/PLATELET
Abs Immature Granulocytes: 0.03 10*3/uL (ref 0.00–0.07)
Basophils Absolute: 0 10*3/uL (ref 0.0–0.1)
Basophils Relative: 0 %
Eosinophils Absolute: 0 10*3/uL (ref 0.0–0.5)
Eosinophils Relative: 1 %
HCT: 42.2 % (ref 39.0–52.0)
Hemoglobin: 13.3 g/dL (ref 13.0–17.0)
Immature Granulocytes: 1 %
Lymphocytes Relative: 51 %
Lymphs Abs: 2.6 10*3/uL (ref 0.7–4.0)
MCH: 30.2 pg (ref 26.0–34.0)
MCHC: 31.5 g/dL (ref 30.0–36.0)
MCV: 95.7 fL (ref 80.0–100.0)
Monocytes Absolute: 0.9 10*3/uL (ref 0.1–1.0)
Monocytes Relative: 19 %
Neutro Abs: 1.4 10*3/uL — ABNORMAL LOW (ref 1.7–7.7)
Neutrophils Relative %: 28 %
Platelets: 212 10*3/uL (ref 150–400)
RBC: 4.41 MIL/uL (ref 4.22–5.81)
RDW: 13.3 % (ref 11.5–15.5)
WBC: 5 10*3/uL (ref 4.0–10.5)
nRBC: 0 % (ref 0.0–0.2)

## 2019-02-08 LAB — POCT I-STAT EG7
Acid-base deficit: 1 mmol/L (ref 0.0–2.0)
Bicarbonate: 27 mmol/L (ref 20.0–28.0)
Calcium, Ion: 1.15 mmol/L (ref 1.15–1.40)
HCT: 41 % (ref 39.0–52.0)
Hemoglobin: 13.9 g/dL (ref 13.0–17.0)
O2 Saturation: 91 %
Potassium: 4.2 mmol/L (ref 3.5–5.1)
Sodium: 142 mmol/L (ref 135–145)
TCO2: 29 mmol/L (ref 22–32)
pCO2, Ven: 55.2 mmHg (ref 44.0–60.0)
pH, Ven: 7.297 (ref 7.250–7.430)
pO2, Ven: 69 mmHg — ABNORMAL HIGH (ref 32.0–45.0)

## 2019-02-08 LAB — COMPREHENSIVE METABOLIC PANEL
ALT: 18 U/L (ref 0–44)
AST: 22 U/L (ref 15–41)
Albumin: 3.9 g/dL (ref 3.5–5.0)
Alkaline Phosphatase: 82 U/L (ref 38–126)
Anion gap: 10 (ref 5–15)
BUN: 15 mg/dL (ref 8–23)
CO2: 26 mmol/L (ref 22–32)
Calcium: 9 mg/dL (ref 8.9–10.3)
Chloride: 105 mmol/L (ref 98–111)
Creatinine, Ser: 0.96 mg/dL (ref 0.61–1.24)
GFR calc Af Amer: >60 mL/min (ref >60)
GFR calc non Af Amer: >60 mL/min (ref >60)
Glucose, Bld: 127 mg/dL — ABNORMAL HIGH (ref 70–99)
Potassium: 4.3 mmol/L (ref 3.5–5.1)
Sodium: 141 mmol/L (ref 135–145)
Total Bilirubin: 0.6 mg/dL (ref 0.3–1.2)
Total Protein: 7.7 g/dL (ref 6.5–8.1)

## 2019-02-08 LAB — TROPONIN I (HIGH SENSITIVITY)
Troponin I (High Sensitivity): 8 ng/L (ref <18)
Troponin I (High Sensitivity): 12 ng/L (ref <18)

## 2019-02-08 LAB — LACTIC ACID, PLASMA
Lactic Acid, Venous: 3.3 mmol/L (ref 0.5–1.9)
Lactic Acid, Venous: 0.6 mmol/L (ref 0.5–1.9)

## 2019-02-08 LAB — SARS CORONAVIRUS 2 BY RT PCR (HOSPITAL ORDER, PERFORMED IN ~~LOC~~ HOSPITAL LAB): SARS Coronavirus 2: NEGATIVE

## 2019-02-08 MED ORDER — METHYLPREDNISOLONE SODIUM SUCC 125 MG IJ SOLR
125.0000 mg | Freq: Once | INTRAMUSCULAR | Status: AC
  Administered 2019-02-08: 125 mg via INTRAVENOUS
  Filled 2019-02-08: qty 2

## 2019-02-08 MED ORDER — ALBUTEROL SULFATE HFA 108 (90 BASE) MCG/ACT IN AERS: 4.0000 | INHALATION_SPRAY | RESPIRATORY_TRACT | Status: DC

## 2019-02-08 MED ORDER — METHYLPREDNISOLONE SODIUM SUCC 125 MG IJ SOLR
60.0000 mg | Freq: Four times a day (QID) | INTRAMUSCULAR | Status: DC
  Administered 2019-02-09 (×2): 60 mg via INTRAVENOUS
  Filled 2019-02-08 (×2): qty 2

## 2019-02-08 MED ORDER — ALBUTEROL SULFATE (2.5 MG/3ML) 0.083% IN NEBU
2.5000 mg | INHALATION_SOLUTION | RESPIRATORY_TRACT | Status: DC | PRN
  Administered 2019-02-09: 2.5 mg via RESPIRATORY_TRACT
  Filled 2019-02-08: qty 3

## 2019-02-08 MED ORDER — ALBUTEROL SULFATE HFA 108 (90 BASE) MCG/ACT IN AERS
8.0000 | INHALATION_SPRAY | Freq: Once | RESPIRATORY_TRACT | Status: AC
  Administered 2019-02-08: 8 via RESPIRATORY_TRACT
  Filled 2019-02-08: qty 6.7

## 2019-02-08 NOTE — ED Triage Notes (Signed)
Pt arrives with SOB. RA sats 74%, pt placed on O2. Hx of COPD.

## 2019-02-08 NOTE — H&P (Signed)
History and Physical    PEPE MINEAU VHQ:[id number] DOB: 03-Apr-1941 DOA: 02/08/2019  PCP: Jani Gravel, MD   Patient coming from: Home   Chief Complaint: SOB   HPI: JUEL BELLEROSE is a 77 y.o. male with medical history significant for COPD with chronic hypoxic respiratory failure, chronic pain, and hypertension, now presenting to the emergency department for evaluation of shortness of breath.  Patient reports he been in his usual state of health until the insidious development of shortness of breath over the past day or so, progressively worsening today and becoming severe tonight.  He has a chronic productive cough that improved when he quit smoking 6 months ago, but has worsened again recently.  There has not been any chest pain or leg swelling or tenderness.  Patient has not noted any fevers or chills.  Reports that he is prescribed supplemental oxygen and sometimes uses 2 to 4 L/min, but has not been using it lately.  He does not know of any sick contacts.  He had some slight relief with albuterol at home, but overall he continued to worsen, reports that he began to panic, and called EMS.  He was found to be saturating in the low to mid 70s on room air and transported with nonrebreather.  ED Course: Upon arrival to the ED, patient is found to be afebrile, saturating 74% on room air, tachypneic to 30, tachycardic in the 110s, and hypertensive to 200/100.  EKG features sinus tachycardia with rate 118 and PVCs.  Chest x-ray is notable for hyperinflation with emphysematous changes, subsegmental atelectasis at the left base, and bibasilar bronchitic changes with scarring.  Chemistry panel and CBC are unremarkable, troponin is negative x2, and initial lactic acid is 3.3, normalizing to 0.6 in the ED.  Patient was given IV Solu-Medrol and albuterol in the emergency department, has improved significantly, but continues to have some dyspnea at rest and will be observed for ongoing evaluation and management.   Review of Systems:  All other systems reviewed and apart from HPI, are negative.  Past Medical History:  Diagnosis Date  . Alcohol abuse   . Anemia   . Arthritis   . Asthma   . B12 deficiency 11/03/2013  . BPH (benign prostatic hypertrophy)   . COPD (chronic obstructive pulmonary disease) ([place])   . Dementia ([place])    ?  [name] Kitchen Diabetes mellitus ([place]) 01/10/2012  . Femoral hernia, right 03/27/2012  . Glucose intolerance (impaired glucose tolerance)   . Hyperlipidemia   . Hypertension   . Incisional hernias - swiss-cheese-type periumbilical 11/07/3242  . Inguinal hernia - right 01/10/2012  . Leukopenia 01/29/2013  . Monocytosis 01/29/2013  . Normochromic anemia 01/29/2013  . Osteopenia     Past Surgical History:  Procedure Laterality Date  . HEMORRHOID SURGERY    . HERNIA REPAIR    . INGUINAL HERNIA REPAIR  02/24/2012   Procedure: LAPAROSCOPIC INGUINAL HERNIA;  Surgeon: Adin Hector, MD;  Location: [facility];  Service: General;  Laterality: N/A;  . TUMOR EXCISION     intestinal after SBO ( Benign), appendectomy  . VENTRAL HERNIA REPAIR  02/24/2012   Procedure: LAPAROSCOPIC VENTRAL HERNIA;  Surgeon: Adin Hector, MD;  Location: [facility];  Service: General;  Laterality: N/A;  lysis of adhesions, excision of abdominal wall lipomae     reports that he quit smoking about 6 months ago. His smoking use included cigarettes. He started smoking about 63 years ago. He has a 63.00 pack-year smoking history.  He has never used smokeless tobacco. He reports that he does not drink alcohol or use drugs.  Allergies  Allergen Reactions  . Adhesive [Tape] Rash    No "PLASTIC" tape!!! Patient broke out in blisters!!    Family History  Problem Relation Age of Onset  . Lung cancer Father   . Lung cancer Mother   . Diabetes Brother      Prior to Admission medications   Medication Sig Start Date End Date Taking? Authorizing Provider  albuterol (PROVENTIL) (2.5 MG/3ML) 0.083% nebulizer solution Take 3  mLs (2.5 mg total) by nebulization every 6 (six) hours as needed for wheezing or shortness of breath. 01/03/18  Yes Lauraine Rinne, NP  albuterol (VENTOLIN HFA) 108 (90 Base) MCG/ACT inhaler Inhale 2 puffs into the lungs every 4 (four) hours as needed for wheezing or shortness of breath. 12/13/18  Yes Lauraine Rinne, NP  Cholecalciferol (VITAMIN D-3) 25 MCG (1000 UT) CAPS Take 1,000 Units by mouth at bedtime.   Yes [provider]  cyclobenzaprine (FLEXERIL) 10 MG tablet Take 1 tablet (10 mg total) by mouth 2 (two) times daily as needed for muscle spasms. 12/12/18  Yes Maudie Flakes, MD  diclofenac sodium (VOLTAREN) 1 % GEL Apply 4 g topically 4 (four) times daily. Patient taking differently: Apply 4 g topically 4 (four) times daily as needed (for pain- to affected sites).  08/28/18  Yes Deno Etienne, DO  escitalopram (LEXAPRO) 5 MG tablet Take 5 mg by mouth daily. 12/11/18  Yes [provider]  Fluticasone-Umeclidin-Vilant (TRELEGY ELLIPTA) 100-62.5-25 MCG/INH AEPB Inhale 1 puff into the lungs daily. 02/02/18  Yes Mannam, Praveen, MD  ibuprofen (ADVIL) 200 MG tablet Take 400 mg by mouth at bedtime.   Yes [provider]  Ipratropium-Albuterol (COMBIVENT RESPIMAT) 20-100 MCG/ACT AERS respimat Inhale 1 puff into the lungs every 6 (six) hours.   Yes [provider]  losartan (COZAAR) 50 MG tablet Take 50 mg by mouth at bedtime.  12/26/11  Yes [provider]  OXYGEN Inhale 4 L into the lungs continuous.   Yes [provider]  oxymetazoline (AFRIN NODRIP SEVERE CONGEST) 0.05 % nasal spray Place 1 spray into both nostrils every 3 (three) hours.   Yes [provider]  simvastatin (ZOCOR) 40 MG tablet Take 40 mg by mouth at bedtime.  12/27/11  Yes [provider]  vitamin B-12 (CYANOCOBALAMIN) 100 MCG tablet Take 100 mcg by mouth at bedtime.    Yes [provider]    Physical Exam: Vitals:   02/08/19 2000 02/08/19 2030 02/08/19 2100  02/08/19 2115  BP: (!) 160/69  (!) 172/79 (!) 152/76  Pulse: 93 91 84 100  Resp: (!) 22 20 (!) 27 20  Temp:      TempSrc:      SpO2: 95% 95% 96% 94%    Constitutional: NAD, mild tachypnea and dyspnea with speech  Eyes: PERTLA, lids and conjunctivae normal ENMT: Mucous membranes are moist. Posterior pharynx clear of any exudate or lesions.   Neck: normal, supple, no masses, no thyromegaly Respiratory: Tachypnea. Diminished breath sounds, prolonged expiratory phase. No pallor or cyanosis.  Cardiovascular: S1 & S2 heard, regular rate and rhythm. No extremity edema.  Abdomen: No distension, no tenderness, soft. Bowel sounds active.  Musculoskeletal: no clubbing / cyanosis. No joint deformity upper and lower extremities.   Skin: no significant rashes, lesions, ulcers. Warm, dry, well-perfused. Neurologic: CN 2-12 grossly intact. Sensation intact. Strength 5/5 in all 4 limbs.  Psychiatric: Alert and oriented x 3. Pleasant, cooperative.    Labs on Admission: I have personally reviewed following labs and imaging studies  CBC: Recent Labs  Lab 02/08/19 1737 02/08/19 1819  WBC 5.0  --   NEUTROABS 1.4*  --   HGB 13.3 13.9  HCT 42.2 41.0  MCV 95.7  --   PLT 212  --    Basic Metabolic Panel: Recent Labs  Lab 02/08/19 1737 02/08/19 1819  NA 141 142  K 4.3 4.2  CL 105  --   CO2 26  --   GLUCOSE 127*  --   BUN 15  --   CREATININE 0.96  --   CALCIUM 9.0  --    GFR: CrCl cannot be calculated (Unknown ideal weight.). Liver Function Tests: Recent Labs  Lab 02/08/19 1737  AST 22  ALT 18  ALKPHOS 82  BILITOT 0.6  PROT 7.7  ALBUMIN 3.9   No results for input(s): LIPASE, AMYLASE in the last 168 hours. No results for input(s): AMMONIA in the last 168 hours. Coagulation Profile: No results for input(s): INR, PROTIME in the last 168 hours. Cardiac Enzymes: No results for input(s): CKTOTAL, CKMB, CKMBINDEX, TROPONINI in the last 168 hours. BNP (last 3 results) No results for  input(s): PROBNP in the last 8760 hours. HbA1C: No results for input(s): HGBA1C in the last 72 hours. CBG: No results for input(s): GLUCAP in the last 168 hours. Lipid Profile: No results for input(s): CHOL, HDL, LDLCALC, TRIG, CHOLHDL, LDLDIRECT in the last 72 hours. Thyroid Function Tests: No results for input(s): TSH, T4TOTAL, FREET4, T3FREE, THYROIDAB in the last 72 hours. Anemia Panel: No results for input(s): VITAMINB12, FOLATE, FERRITIN, TIBC, IRON, RETICCTPCT in the last 72 hours. Urine analysis:    Component Value Date/Time   COLORURINE YELLOW 07/22/2013 0725   APPEARANCEUR CLEAR 07/22/2013 0725   LABSPEC 1.011 07/22/2013 0725   PHURINE 6.5 07/22/2013 0725   GLUCOSEU NEGATIVE 07/22/2013 0725   HGBUR TRACE (A) 07/22/2013 0725   BILIRUBINUR NEGATIVE 07/22/2013 0725   KETONESUR NEGATIVE 07/22/2013 0725   PROTEINUR NEGATIVE 07/22/2013 0725   UROBILINOGEN 0.2 07/22/2013 0725   NITRITE NEGATIVE 07/22/2013 0725   LEUKOCYTESUR NEGATIVE 07/22/2013 0725   Sepsis Labs: @LABRCNTIP (procalcitonin:4,lacticidven:4) ) Recent Results (from the past 240 hour(s))  SARS Coronavirus 2 (CEPHEID - Performed in [facility] hospital lab), Hosp Order     Status: None   Collection Time: 02/08/19  5:35 PM   Specimen: Nasopharyngeal Swab  Result Value Ref Range Status   SARS Coronavirus 2 NEGATIVE NEGATIVE Final    Comment: (NOTE) If result is NEGATIVE SARS-CoV-2 target nucleic acids are NOT DETECTED. The SARS-CoV-2 RNA is generally detectable in upper and lower  respiratory specimens during the acute phase of infection. The lowest  concentration of SARS-CoV-2 viral copies this assay can detect is 250  copies / mL. A negative result does not preclude SARS-CoV-2 infection  and should not be used as the sole basis for treatment or other  patient management decisions.  A negative result may occur with  improper specimen collection / handling, submission of specimen other  than nasopharyngeal  swab, presence of viral mutation(s) within the  areas targeted by this assay, and inadequate number of viral copies  (<250 copies / mL). A negative result must be combined with clinical  observations, patient history, and epidemiological information. If result is POSITIVE SARS-CoV-2 target nucleic acids are DETECTED. The SARS-CoV-2 RNA is generally detectable in upper and lower  respiratory  specimens dur ing the acute phase of infection.  Positive  results are indicative of active infection with SARS-CoV-2.  Clinical  correlation with patient history and other diagnostic information is  necessary to determine patient infection status.  Positive results do  not rule out bacterial infection or co-infection with other viruses. If result is PRESUMPTIVE POSTIVE SARS-CoV-2 nucleic acids MAY BE PRESENT.   A presumptive positive result was obtained on the submitted specimen  and confirmed on repeat testing.  While 2019 novel coronavirus  (SARS-CoV-2) nucleic acids may be present in the submitted sample  additional confirmatory testing may be necessary for epidemiological  and / or clinical management purposes  to differentiate between  SARS-CoV-2 and other Sarbecovirus currently known to infect humans.  If clinically indicated additional testing with an alternate test  methodology [phone]) is advised. The SARS-CoV-2 RNA is generally  detectable in upper and lower respiratory sp ecimens during the acute  phase of infection. The expected result is Negative. Fact Sheet for Patients:  StrictlyIdeas.no Fact Sheet for Healthcare Providers: BankingDealers.co.za This test is not yet approved or cleared by the [place] FDA and has been authorized for detection and/or diagnosis of SARS-CoV-2 by FDA under an Emergency Use Authorization (EUA).  This EUA will remain in effect (meaning this test can be used) for the duration of the COVID-19 declaration  under Section 564(b)(1) of the Act, 21 U.S.C. section 360bbb-3(b)(1), unless the authorization is terminated or revoked sooner. Performed at [facility], [place] [address], [place], [place] [address]      Radiological Exams on Admission: Dg Chest 2 View  Result Date: 02/08/2019 CLINICAL DATA:  Shortness of breath EXAM: CHEST - 2 VIEW COMPARISON:  02/08/2019, CT 12/25/2018, radiograph 12/24/2018, 01/03/2018 FINDINGS: Hyperinflation with emphysematous disease. Streaky atelectasis at the left base. Bronchitic changes and scarring at both bases. No pleural effusion. No focal consolidation. Stable cardiomediastinal silhouette with aortic atherosclerosis. No pneumothorax IMPRESSION: 1. Hyperinflation with emphysematous disease. 2. Subsegmental atelectasis left base. Bronchitic changes and scarring at both bases Electronically Signed   By: Donavan Foil M.D.   On: 02/08/2019 23:12   Dg Chest Portable 1 View  Result Date: 02/08/2019 CLINICAL DATA:  Shortness of breath. EXAM: PORTABLE CHEST 1 VIEW COMPARISON:  Radiographs of Dec 24, 2018. FINDINGS: Stable cardiomegaly. Atherosclerosis of thoracic aorta is noted. No pneumothorax is noted. Lung bases are not completely visualized on this study. No definite pulmonary abnormality is noted within the visualized lung fields. Bony thorax is unremarkable. IMPRESSION: Limited exam as lung bases are not completely visualized. No definite acute abnormality is noted. Aortic Atherosclerosis (ICD10-I70.0). Electronically Signed   By: Marijo Conception M.D.   On: 02/08/2019 18:14    EKG: Independently reviewed. Sinus tachycardia (rate 118), PVC's.   Assessment/Plan   1. COPD with acute exacerbation; chronic hypoxic respiratory failure  - Presents with SOB that worsened throughout the day, becoming severe and presenting in distress  - He is afebrile with no evidence for PNA on CXR, COVID-19 neg - He improved rapidly in ED with albuterol, steroids, and supplemental  O2 but remains dyspneic at rest  - Continue Trelegy, albuterol, supplemental O2, check sputum culture, continue systemic steroids, start azithromycin    2. Hypertension with hypertensive urgency  - BP 200/100 initially in ED, likely secondary to acute resp distress and anxiety, improved with breathing treatments and supplemental O2   - Continue losartan     PPE: Mask, face shield  DVT prophylaxis: Lovenox  Code Status: Full  Family Communication: Discussed with patient  Consults called: None Admission status: Observation     Vianne Bulls, MD [facility] Pager [phone]  If 7PM-7AM, please contact night-coverage [email or web address] Password [email or web address]  02/08/2019, 11:58 PM

## 2019-02-08 NOTE — ED Provider Notes (Signed)
[facility] EMERGENCY DEPARTMENT Provider Note   CSN: [id number] Arrival date & time: 02/08/19  1729    History   Chief Complaint Chief Complaint  Patient presents with   Shortness of Breath    HPI TIJUAN DANTES is a 78 y.o. male.     The history is provided by the patient and medical records.  Shortness of Breath Severity:  Severe Onset quality:  Gradual Duration:  1 day Timing:  Constant Progression:  Worsening Chronicity:  New Context: not URI   Relieved by:  Nothing Worsened by:  Exertion and coughing Ineffective treatments:  Inhaler Associated symptoms: cough, diaphoresis and wheezing   Associated symptoms: no abdominal pain, no chest pain, no ear pain, no fever, no headaches, no rash, no sore throat, no syncope and no vomiting     Past Medical History:  Diagnosis Date   Alcohol abuse    Anemia    Arthritis    Asthma    B12 deficiency 11/03/2013   BPH (benign prostatic hypertrophy)    COPD (chronic obstructive pulmonary disease) ([place])    Dementia ([place])    ?   Diabetes mellitus ([place]) 01/10/2012   Femoral hernia, right 03/27/2012   Glucose intolerance (impaired glucose tolerance)    Hyperlipidemia    Hypertension    Incisional hernias - swiss-cheese-type periumbilical 8/0/9983   Inguinal hernia - right 01/10/2012   Leukopenia 01/29/2013   Monocytosis 01/29/2013   Normochromic anemia 01/29/2013   Osteopenia     Patient Active Problem List   Diagnosis Date Noted   COPD with acute exacerbation ([place]) 02/08/2019   Hypertensive urgency 02/08/2019   CAP (community acquired pneumonia) 12/25/2018   Acute on chronic respiratory failure with hypoxia ([place]) 12/25/2018   Elevated d-dimer 12/25/2018   Chronic respiratory failure with hypoxia ([place]) 12/13/2018   Abnormal finding on EKG 01/03/2018   OSA (obstructive sleep apnea) 06/14/2017   Sinus tachycardia 04/11/2016   Essential hypertension 04/11/2016   COPD (chronic  obstructive pulmonary disease) ([place]) 01/11/2016   B12 deficiency 11/03/2013   Leukopenia 01/29/2013   Monocytosis 01/29/2013   Normochromic anemia 01/29/2013   Tobacco abuse 03/27/2012   Diabetes mellitus ([place]) 01/10/2012    Past Surgical History:  Procedure Laterality Date   HEMORRHOID SURGERY     HERNIA REPAIR     INGUINAL HERNIA REPAIR  02/24/2012   Procedure: LAPAROSCOPIC INGUINAL HERNIA;  Surgeon: Adin Hector, MD;  Location: [facility];  Service: General;  Laterality: N/A;   TUMOR EXCISION     intestinal after SBO ( Benign), appendectomy   VENTRAL HERNIA REPAIR  02/24/2012   Procedure: LAPAROSCOPIC VENTRAL HERNIA;  Surgeon: Adin Hector, MD;  Location: [facility];  Service: General;  Laterality: N/A;  lysis of adhesions, excision of abdominal wall lipomae        Home Medications    Prior to Admission medications   Medication Sig Start Date End Date Taking? Authorizing Provider  albuterol (PROVENTIL) (2.5 MG/3ML) 0.083% nebulizer solution Take 3 mLs (2.5 mg total) by nebulization every 6 (six) hours as needed for wheezing or shortness of breath. 01/03/18  Yes Lauraine Rinne, NP  albuterol (VENTOLIN HFA) 108 (90 Base) MCG/ACT inhaler Inhale 2 puffs into the lungs every 4 (four) hours as needed for wheezing or shortness of breath. 12/13/18  Yes Lauraine Rinne, NP  Cholecalciferol (VITAMIN D-3) 25 MCG (1000 UT) CAPS Take 1,000 Units by mouth at bedtime.   Yes [provider]  cyclobenzaprine (FLEXERIL)  10 MG tablet Take 1 tablet (10 mg total) by mouth 2 (two) times daily as needed for muscle spasms. 12/12/18  Yes Maudie Flakes, MD  diclofenac sodium (VOLTAREN) 1 % GEL Apply 4 g topically 4 (four) times daily. Patient taking differently: Apply 4 g topically 4 (four) times daily as needed (for pain- to affected sites).  08/28/18  Yes Deno Etienne, DO  escitalopram (LEXAPRO) 5 MG tablet Take 5 mg by mouth daily. 12/11/18  Yes [provider]    Fluticasone-Umeclidin-Vilant (TRELEGY ELLIPTA) 100-62.5-25 MCG/INH AEPB Inhale 1 puff into the lungs daily. 02/02/18  Yes Mannam, Praveen, MD  ibuprofen (ADVIL) 200 MG tablet Take 400 mg by mouth at bedtime.   Yes [provider]  Ipratropium-Albuterol (COMBIVENT RESPIMAT) 20-100 MCG/ACT AERS respimat Inhale 1 puff into the lungs every 6 (six) hours.   Yes [provider]  losartan (COZAAR) 50 MG tablet Take 50 mg by mouth at bedtime.  12/26/11  Yes [provider]  OXYGEN Inhale 4 L into the lungs continuous.   Yes [provider]  oxymetazoline (AFRIN NODRIP SEVERE CONGEST) 0.05 % nasal spray Place 1 spray into both nostrils every 3 (three) hours.   Yes [provider]  simvastatin (ZOCOR) 40 MG tablet Take 40 mg by mouth at bedtime.  12/27/11  Yes [provider]  vitamin B-12 (CYANOCOBALAMIN) 100 MCG tablet Take 100 mcg by mouth at bedtime.    Yes [provider]  predniSONE (DELTASONE) 20 MG tablet Take 1 tablet (20 mg total) by mouth daily with breakfast for 4 days. 02/10/19 02/14/19  Arrien, Jimmy Picket, MD    Family History Family History  Problem Relation Age of Onset   Lung cancer Father    Lung cancer Mother    Diabetes Brother     Social History Social History   Tobacco Use   Smoking status: Former Smoker    Packs/day: 1.00    Years: 63.00    Pack years: 63.00    Types: Cigarettes    Start date: 08/09/1955    Quit date: 08/08/2018    Years since quitting: 0.5   Smokeless tobacco: Never Used   Tobacco comment: reports he stopped at the beginning of this year  Substance Use Topics   Alcohol use: No    Alcohol/week: 0.0 standard drinks   Drug use: No     Allergies   Adhesive [tape]   Review of Systems Review of Systems  Constitutional: Positive for activity change, diaphoresis and fatigue. Negative for chills and fever.  HENT: Negative for congestion, ear pain and sore throat.   Eyes: Negative  for pain and visual disturbance.  Respiratory: Positive for cough, shortness of breath and wheezing.   Cardiovascular: Negative for chest pain, palpitations and syncope.  Gastrointestinal: Negative for abdominal pain, diarrhea, nausea and vomiting.  Genitourinary: Negative for dysuria and hematuria.  Musculoskeletal: Negative for arthralgias and back pain.  Skin: Negative for color change and rash.  Neurological: Negative for seizures, syncope and headaches.  All other systems reviewed and are negative.    Physical Exam Updated Vital Signs BP 140/74 (BP Location: Left Arm)    Pulse (!) 102    Temp 98.2 F (36.8 C) (Oral)    Resp 18    SpO2 97%   Physical Exam Vitals signs and nursing note reviewed.  Constitutional:      Appearance: He is well-developed. He is ill-appearing.  HENT:     Head: Normocephalic and atraumatic.  Eyes:     Conjunctiva/sclera: Conjunctivae normal.  Neck:     Musculoskeletal: Neck supple.  Cardiovascular:     Rate and Rhythm: Regular rhythm. Tachycardia present.     Heart sounds: No murmur.  Pulmonary:     Effort: Tachypnea, accessory muscle usage and respiratory distress present.     Breath sounds: No stridor. Decreased breath sounds (diffuse) present.  Abdominal:     Palpations: Abdomen is soft.     Tenderness: There is no abdominal tenderness.  Musculoskeletal: Normal range of motion.  Skin:    General: Skin is warm and dry.     Findings: No rash.  Neurological:     Mental Status: He is alert.      ED Treatments / Results  Labs (all labs ordered are listed, but only abnormal results are displayed) Labs Reviewed  COMPREHENSIVE METABOLIC PANEL - Abnormal; Notable for the following components:      Result Value   Glucose, Bld 127 (*)    All other components within normal limits  CBC WITH DIFFERENTIAL/PLATELET - Abnormal; Notable for the following components:   Neutro Abs 1.4 (*)    All other components within normal limits  LACTIC ACID,  PLASMA - Abnormal; Notable for the following components:   Lactic Acid, Venous 3.3 (*)    All other components within normal limits  BASIC METABOLIC PANEL - Abnormal; Notable for the following components:   Glucose, Bld 232 (*)    Calcium 8.7 (*)    All other components within normal limits  CBC - Abnormal; Notable for the following components:   WBC 1.3 (*)    RBC 3.89 (*)    Hemoglobin 11.7 (*)    HCT 36.1 (*)    All other components within normal limits  GLUCOSE, CAPILLARY - Abnormal; Notable for the following components:   Glucose-Capillary 151 (*)    All other components within normal limits  POCT I-STAT EG7 - Abnormal; Notable for the following components:   pO2, Ven 69.0 (*)    All other components within normal limits  SARS CORONAVIRUS 2 (HOSPITAL ORDER, [facility])  EXPECTORATED SPUTUM ASSESSMENT W REFEX TO RESP CULTURE  TROPONIN I (HIGH SENSITIVITY)  TROPONIN I (HIGH SENSITIVITY)  LACTIC ACID, PLASMA    EKG EKG Interpretation  Date/Time:  Friday February 08 2019 17:39:30 EDT Ventricular Rate:  126 PR Interval:    QRS Duration: 100 QT Interval:  301 QTC Calculation: 436 R Axis:   84 Text Interpretation:  Sinus tachycardia Borderline right axis deviation Probable anteroseptal infarct, old No STEMI  Confirmed by Nanda Quinton [id number]) on 02/08/2019 5:45:21 PM   Radiology Dg Chest 2 View  Result Date: 02/08/2019 CLINICAL DATA:  Shortness of breath EXAM: CHEST - 2 VIEW COMPARISON:  02/08/2019, CT 12/25/2018, radiograph 12/24/2018, 01/03/2018 FINDINGS: Hyperinflation with emphysematous disease. Streaky atelectasis at the left base. Bronchitic changes and scarring at both bases. No pleural effusion. No focal consolidation. Stable cardiomediastinal silhouette with aortic atherosclerosis. No pneumothorax IMPRESSION: 1. Hyperinflation with emphysematous disease. 2. Subsegmental atelectasis left base. Bronchitic changes and scarring at both bases  Electronically Signed   By: Donavan Foil M.D.   On: 02/08/2019 23:12   Dg Chest Portable 1 View  Result Date: 02/08/2019 CLINICAL DATA:  Shortness of breath. EXAM: PORTABLE CHEST 1 VIEW COMPARISON:  Radiographs of Dec 24, 2018. FINDINGS: Stable cardiomegaly. Atherosclerosis of thoracic aorta is noted. No pneumothorax is noted. Lung bases are not completely visualized on this study.  No definite pulmonary abnormality is noted within the visualized lung fields. Bony thorax is unremarkable. IMPRESSION: Limited exam as lung bases are not completely visualized. No definite acute abnormality is noted. Aortic Atherosclerosis (ICD10-I70.0). Electronically Signed   By: Marijo Conception M.D.   On: 02/08/2019 18:14    Procedures Procedures (including critical care time)  Medications Ordered in ED Medications  losartan (COZAAR) tablet 50 mg (50 mg Oral Given 02/09/19 0159)  simvastatin (ZOCOR) tablet 40 mg (has no administration in time range)  cyclobenzaprine (FLEXERIL) tablet 10 mg (has no administration in time range)  diclofenac sodium (VOLTAREN) 1 % transdermal gel 4 g (has no administration in time range)  enoxaparin (LOVENOX) injection 40 mg (has no administration in time range)  sodium chloride flush (NS) 0.9 % injection 3 mL (3 mLs Intravenous Given 02/09/19 0200)  sodium chloride flush (NS) 0.9 % injection 3 mL (has no administration in time range)  0.9 %  sodium chloride infusion (has no administration in time range)  acetaminophen (TYLENOL) tablet 650 mg (has no administration in time range)    Or  acetaminophen (TYLENOL) suppository 650 mg (has no administration in time range)  HYDROcodone-acetaminophen (NORCO/VICODIN) 5-325 MG per tablet 1 tablet (has no administration in time range)  polyethylene glycol (MIRALAX / GLYCOLAX) packet 17 g (has no administration in time range)  ondansetron (ZOFRAN) tablet 4 mg (has no administration in time range)    Or  ondansetron (ZOFRAN) injection 4 mg (has  no administration in time range)  labetalol (NORMODYNE) injection 10 mg (has no administration in time range)  albuterol (PROVENTIL) (2.5 MG/3ML) 0.083% nebulizer solution 2.5 mg (2.5 mg Nebulization Given 02/09/19 0238)  fluticasone furoate-vilanterol (BREO ELLIPTA) 100-25 MCG/INH 1 puff (1 puff Inhalation Given 02/09/19 0821)  umeclidinium bromide (INCRUSE ELLIPTA) 62.5 MCG/INH 1 puff (1 puff Inhalation Given 02/09/19 0823)  predniSONE (DELTASONE) tablet 20 mg (has no administration in time range)  albuterol (VENTOLIN HFA) 108 (90 Base) MCG/ACT inhaler 8 puff (8 puffs Inhalation Given 02/08/19 1739)  methylPREDNISolone sodium succinate (SOLU-MEDROL) 125 mg/2 mL injection 125 mg (125 mg Intravenous Given 02/08/19 1807)     Initial Impression / Assessment and Plan / ED Course  I have reviewed the triage vital signs and the nursing notes.  Pertinent labs & imaging results that were available during my care of the patient were reviewed by me and considered in my medical decision making (see chart for details).  Of note, this patient was evaluated in the Emergency Department for the symptoms described in the history of present illness. He was evaluated in the context of the global COVID-19 pandemic, which necessitated consideration that the patient might be at risk for infection with the SARS-CoV-2 virus that causes COVID-19. Institutional protocols and algorithms that pertain to the evaluation of patients at risk for COVID-19 are in a state of rapid change based on information released by regulatory bodies including the CDC and federal and state organizations. These policies and algorithms were followed during the patient's care in the ED. During this patient encounter, the patient was wearing a mask, and throughout this encounter I was wearing at least a surgical mask.  I was not within 6 feet of this patient for more than 15 minutes without eye protection when they were not wearing mask.   Differentials  considered: CAP, COPD exacerbation, COVID-19  Medical Decision Making:  RAJAT STAVER is a 78 y.o. male with the above past medical history significant for COPD, chronic pain, and  HTN who presented to the emergency department with acute shortness of breath and was found to be tachycardic, tachypneic and hypoxic with a room air oxygen saturation of 74%.  He was afebrile on arrival and was in moderate respiratory distress.  He was given 8 puffs of albuterol inhaler, 125 mg of IV Solu-Medrol.  He was continuously monitored and frequently reassessed.  He had a significant improvement in his clinical condition and work of breathing since arrival, and even expressed a desire to possibly be discharged home.  However, in consideration of the extent of his shortness of breath and level of hypoxia on arrival, I feel that it is in the best (and safest) interest of the patient to be admitted for observation overnight with albuterol treatments every 4 hours and close observation.  He is in agreement with this plan.  Patient was admitted to the hospitalist service.   The plan for this patient was discussed with my attending physician, Dr. Nanda Quinton, who voiced agreement and who oversaw evaluation and treatment of this patient.   CLINICAL IMPRESSION: COPD Exacerbation  DISPOSITION: admit to hospitalist service   Lyssa Hackley A. Jimmye Norman, MD Resident Physician, PGY-3 Emergency Medicine    Jefm Petty, MD 02/10/19 Mont D[place], MD 02/10/19 1101

## 2019-02-09 DIAGNOSIS — J9621 Acute and chronic respiratory failure with hypoxia: Secondary | ICD-10-CM

## 2019-02-09 DIAGNOSIS — G4733 Obstructive sleep apnea (adult) (pediatric): Secondary | ICD-10-CM | POA: Diagnosis not present

## 2019-02-09 DIAGNOSIS — I160 Hypertensive urgency: Secondary | ICD-10-CM | POA: Diagnosis not present

## 2019-02-09 DIAGNOSIS — J441 Chronic obstructive pulmonary disease with (acute) exacerbation: Secondary | ICD-10-CM | POA: Diagnosis not present

## 2019-02-09 LAB — BASIC METABOLIC PANEL
Anion gap: 7 (ref 5–15)
BUN: 18 mg/dL (ref 8–23)
CO2: 26 mmol/L (ref 22–32)
Calcium: 8.7 mg/dL — ABNORMAL LOW (ref 8.9–10.3)
Chloride: 105 mmol/L (ref 98–111)
Creatinine, Ser: 0.87 mg/dL (ref 0.61–1.24)
GFR calc Af Amer: >60 mL/min (ref >60)
GFR calc non Af Amer: >60 mL/min (ref >60)
Glucose, Bld: 232 mg/dL — ABNORMAL HIGH (ref 70–99)
Potassium: 4.3 mmol/L (ref 3.5–5.1)
Sodium: 138 mmol/L (ref 135–145)

## 2019-02-09 LAB — CBC
HCT: 36.1 % — ABNORMAL LOW (ref 39.0–52.0)
Hemoglobin: 11.7 g/dL — ABNORMAL LOW (ref 13.0–17.0)
MCH: 30.1 pg (ref 26.0–34.0)
MCHC: 32.4 g/dL (ref 30.0–36.0)
MCV: 92.8 fL (ref 80.0–100.0)
Platelets: 179 10*3/uL (ref 150–400)
RBC: 3.89 MIL/uL — ABNORMAL LOW (ref 4.22–5.81)
RDW: 13.2 % (ref 11.5–15.5)
WBC: 1.3 10*3/uL — CL (ref 4.0–10.5)
nRBC: 0 % (ref 0.0–0.2)

## 2019-02-09 LAB — GLUCOSE, CAPILLARY
Glucose-Capillary: 151 mg/dL — ABNORMAL HIGH (ref 70–99)
Glucose-Capillary: 133 mg/dL — ABNORMAL HIGH (ref 70–99)

## 2019-02-09 MED ORDER — CYCLOBENZAPRINE HCL 10 MG PO TABS: 10.0000 mg | ORAL_TABLET | Freq: Two times a day (BID) | ORAL | Status: DC | PRN

## 2019-02-09 MED ORDER — HYDROCODONE-ACETAMINOPHEN 5-325 MG PO TABS: 1.0000 | ORAL_TABLET | ORAL | Status: DC | PRN

## 2019-02-09 MED ORDER — FLUTICASONE-UMECLIDIN-VILANT 100-62.5-25 MCG/INH IN AEPB: 1.0000 | INHALATION_SPRAY | Freq: Every day | RESPIRATORY_TRACT | Status: DC

## 2019-02-09 MED ORDER — POLYETHYLENE GLYCOL 3350 17 G PO PACK: 17.0000 g | PACK | Freq: Every day | ORAL | Status: DC | PRN

## 2019-02-09 MED ORDER — SODIUM CHLORIDE 0.9 % IV SOLN: 250.0000 mL | INTRAVENOUS | Status: DC | PRN

## 2019-02-09 MED ORDER — ONDANSETRON HCL 4 MG PO TABS: 4.0000 mg | ORAL_TABLET | Freq: Four times a day (QID) | ORAL | Status: DC | PRN

## 2019-02-09 MED ORDER — DICLOFENAC SODIUM 1 % TD GEL
4.0000 g | Freq: Four times a day (QID) | TRANSDERMAL | Status: DC | PRN
  Filled 2019-02-09: qty 100

## 2019-02-09 MED ORDER — SIMVASTATIN 20 MG PO TABS
40.0000 mg | ORAL_TABLET | Freq: Every day | ORAL | Status: DC
  Filled 2019-02-09: qty 2

## 2019-02-09 MED ORDER — ACETAMINOPHEN 325 MG PO TABS: 650.0000 mg | ORAL_TABLET | Freq: Four times a day (QID) | ORAL | Status: DC | PRN

## 2019-02-09 MED ORDER — ONDANSETRON HCL 4 MG/2ML IJ SOLN: 4.0000 mg | Freq: Four times a day (QID) | INTRAMUSCULAR | Status: DC | PRN

## 2019-02-09 MED ORDER — LABETALOL HCL 5 MG/ML IV SOLN: 10.0000 mg | INTRAVENOUS | Status: DC | PRN

## 2019-02-09 MED ORDER — PREDNISONE 20 MG PO TABS: 20.0000 mg | ORAL_TABLET | Freq: Every day | ORAL | 0 refills | Status: DC

## 2019-02-09 MED ORDER — LOSARTAN POTASSIUM 50 MG PO TABS
50.0000 mg | ORAL_TABLET | Freq: Every day | ORAL | Status: DC
  Administered 2019-02-09: 50 mg via ORAL
  Filled 2019-02-09: qty 1

## 2019-02-09 MED ORDER — ENOXAPARIN SODIUM 40 MG/0.4ML ~~LOC~~ SOLN
40.0000 mg | SUBCUTANEOUS | Status: DC
  Administered 2019-02-09: 40 mg via SUBCUTANEOUS
  Filled 2019-02-09: qty 0.4

## 2019-02-09 MED ORDER — ACETAMINOPHEN 650 MG RE SUPP: 650.0000 mg | Freq: Four times a day (QID) | RECTAL | Status: DC | PRN

## 2019-02-09 MED ORDER — UMECLIDINIUM BROMIDE 62.5 MCG/INH IN AEPB
1.0000 | INHALATION_SPRAY | Freq: Every day | RESPIRATORY_TRACT | Status: DC
  Administered 2019-02-09: 1 via RESPIRATORY_TRACT
  Filled 2019-02-09: qty 7

## 2019-02-09 MED ORDER — SODIUM CHLORIDE 0.9% FLUSH: 3.0000 mL | INTRAVENOUS | Status: DC | PRN

## 2019-02-09 MED ORDER — PREDNISONE 20 MG PO TABS
20.0000 mg | ORAL_TABLET | Freq: Every day | ORAL | Status: DC
  Filled 2019-02-09: qty 1

## 2019-02-09 MED ORDER — SODIUM CHLORIDE 0.9% FLUSH
3.0000 mL | Freq: Two times a day (BID) | INTRAVENOUS | Status: DC
  Administered 2019-02-09 (×2): 3 mL via INTRAVENOUS

## 2019-02-09 MED ORDER — FLUTICASONE FUROATE-VILANTEROL 100-25 MCG/INH IN AEPB
1.0000 | INHALATION_SPRAY | Freq: Every day | RESPIRATORY_TRACT | Status: DC
  Administered 2019-02-09: 1 via RESPIRATORY_TRACT
  Filled 2019-02-09: qty 28

## 2019-02-09 MED ORDER — SODIUM CHLORIDE 0.9 % IV SOLN
500.0000 mg | INTRAVENOUS | Status: DC
  Administered 2019-02-09: 500 mg via INTRAVENOUS
  Filled 2019-02-09 (×2): qty 500

## 2019-02-09 NOTE — Plan of Care (Signed)
  Problem: Education: Goal: Knowledge of General Education information will improve Description: Including pain rating scale, medication(s)/side effects and non-pharmacologic comfort measures Outcome: Progressing   Problem: Clinical Measurements: Goal: Respiratory complications will improve Outcome: Progressing   

## 2019-02-09 NOTE — Accreditation Note (Addendum)
DISCHARGE NOTE HOME BRIX BREARLEY to be discharged to home  per MD order. Discussed prescriptions and follow up appointments with the patient.Patient's home medications from pharmacy storage were given to the patient. Prescriptions given to patient; medication list explained in detail. Patient verbalized understanding.  Skin clean, dry and intact without evidence of skin break down, no evidence of skin tears noted. IV catheter discontinued intact. Site without signs and symptoms of complications. Dressing and pressure applied. Pt denies pain at the site currently. No complaints noted.  Patient free of lines, drains, and wounds.   An After Visit Summary (AVS) was printed and given to the patient. Patient escorted via wheelchair, and discharged home via private auto.  [place], Zenon Mayo, RN

## 2019-02-09 NOTE — Discharge Summary (Signed)
Physician Discharge Summary  Cory Kemp NAT:[id number] DOB: Mar 02, 1941 DOA: 02/08/2019  PCP: Jani Gravel, MD  Admit date: 02/08/2019 Discharge date: 02/09/2019  Admitted From: Home  Disposition:  Home   Recommendations for Outpatient Follow-up and new medication changes:  1. Follow up with Dr. [name] in 7 days  2. Continue taking 20 mg of prednisone for 4 days.  3. Continue smoking cessation.   Home Health: no   Equipment/Devices: no   Discharge Condition: stable  CODE STATUS: full Diet recommendation: heart healthy   Brief/Interim Summary: 78 year old male who presented with dyspnea.  He does have significant past medical history for COPD with chronic hypoxic respiratory failure, hypertension and chronic pain syndrome.  He reported about 24 hours of progressive and worsening dyspnea, associated with worsening cough, his symptoms were refractive to supplemental oxygen per nasal cannula at home along with bronchodilators.  On his initial physical examination blood pressure 200/100 and 160/69, heart rate 93, respiratory rate 22-27, oxygen saturation 94% (on supplemental oxygen), he had mild tachypnea, diminished breath sounds bilaterally with prolonged expiratory phase, heart S1-S2 present and rhythmic, abdomen was soft nontender, no lower extremity edema.  Sodium 141, potassium 4.3, chloride 105, bicarb 26, glucose 127, BUN 15, creatinine 0.96, WBC 5.0, hemoglobin 13.3, hematocrit 42.2, platelets 212.  SARS COVID-19 was negative.  His chest radiograph was hyperinflated, increased lung markings at bases bilaterally, chronic scarring.  EKG 118 bpm, normal axis, normal intervals, sinus rhythm, positive PVCs and PACs, no ST segment or T wave changes.  Patient was admitted to the hospital working diagnosis of acute on chronic hypoxic respiratory failure due to COPD exacerbation.  1.  Acute on chronic hypoxic respiratory failure due to COPD exacerbation.  Patient was admitted to the medical ward, he  received aggressive bronchodilator therapy along with systemic steroids, azithromycin and supplemental oxygen with improvement of his symptoms.  By the time of his discharge he is feeling back to his baseline.  He will continue taking prednisone 20 mg for the next 4 days.  Continue bronchodilator therapy with albuterol, ipratropium, plus fluticasone-umeclidin-vilant, (Trelegy-Ellipta).  He was explained to use daily combination of anticholinergic, long-acting beta-2 agonist and inhaled corticosteroid, and use as needed albuterol and Combivent.  He will follow-up as an outpatient with pulmonary clinic.  His discharge oxygen saturation is 97 to 98% on 3 L/m per nasal cannula.   2.  Uncontrolled hypertension.  Continue taking losartan, 50 mg daily.  His discharge blood pressure 167/77.  3.  Dyslipidemia.  Continue simvastatin.  4.  Depression.  Continue escitalopram.  5.  Chronic pain syndrome.  Continue ibuprofen, cyclobenzaprine and topical diclofenac.  Discharge Diagnoses:  Principal Problem:   COPD with acute exacerbation ([place]) Active Problems:   OSA (obstructive sleep apnea)   Acute on chronic respiratory failure with hypoxia ([facility])   Hypertensive urgency    Discharge Instructions   Allergies as of 02/09/2019      Reactions   Adhesive [tape] Rash   No "PLASTIC" tape!!! Patient broke out in blisters!!      Medication List    TAKE these medications   Afrin NoDrip Severe Congest 0.05 % nasal spray Generic drug: oxymetazoline Place 1 spray into both nostrils every 3 (three) hours.   albuterol (2.5 MG/3ML) 0.083% nebulizer solution Commonly known as: PROVENTIL Take 3 mLs (2.5 mg total) by nebulization every 6 (six) hours as needed for wheezing or shortness of breath.   albuterol 108 (90 Base) MCG/ACT inhaler Commonly known as: VENTOLIN HFA  Inhale 2 puffs into the lungs every 4 (four) hours as needed for wheezing or shortness of breath.   Combivent Respimat 20-100 MCG/ACT Aers  respimat Generic drug: Ipratropium-Albuterol Inhale 1 puff into the lungs every 6 (six) hours.   cyclobenzaprine 10 MG tablet Commonly known as: FLEXERIL Take 1 tablet (10 mg total) by mouth 2 (two) times daily as needed for muscle spasms.   diclofenac sodium 1 % Gel Commonly known as: VOLTAREN Apply 4 g topically 4 (four) times daily. What changed:   when to take this  reasons to take this   escitalopram 5 MG tablet Commonly known as: LEXAPRO Take 5 mg by mouth daily.   Fluticasone-Umeclidin-Vilant 100-62.5-25 MCG/INH Aepb Commonly known as: Trelegy Ellipta Inhale 1 puff into the lungs daily.   ibuprofen 200 MG tablet Commonly known as: ADVIL Take 400 mg by mouth at bedtime.   losartan 50 MG tablet Commonly known as: COZAAR Take 50 mg by mouth at bedtime.   OXYGEN Inhale 4 L into the lungs continuous.   predniSONE 20 MG tablet Commonly known as: DELTASONE Take 1 tablet (20 mg total) by mouth daily with breakfast for 4 days. Start taking on: February 10, 2019   simvastatin 40 MG tablet Commonly known as: ZOCOR Take 40 mg by mouth at bedtime.   vitamin B-12 100 MCG tablet Commonly known as: CYANOCOBALAMIN Take 100 mcg by mouth at bedtime.   Vitamin D-3 25 MCG (1000 UT) Caps Take 1,000 Units by mouth at bedtime.       Allergies  Allergen Reactions  . Adhesive [Tape] Rash    No "PLASTIC" tape!!! Patient broke out in blisters!!    Consultations:     Procedures/Studies: Dg Chest 2 View  Result Date: 02/08/2019 CLINICAL DATA:  Shortness of breath EXAM: CHEST - 2 VIEW COMPARISON:  02/08/2019, CT 12/25/2018, radiograph 12/24/2018, 01/03/2018 FINDINGS: Hyperinflation with emphysematous disease. Streaky atelectasis at the left base. Bronchitic changes and scarring at both bases. No pleural effusion. No focal consolidation. Stable cardiomediastinal silhouette with aortic atherosclerosis. No pneumothorax IMPRESSION: 1. Hyperinflation with emphysematous disease. 2.  Subsegmental atelectasis left base. Bronchitic changes and scarring at both bases Electronically Signed   By: Donavan Foil M.D.   On: 02/08/2019 23:12   Dg Chest Portable 1 View  Result Date: 02/08/2019 CLINICAL DATA:  Shortness of breath. EXAM: PORTABLE CHEST 1 VIEW COMPARISON:  Radiographs of Dec 24, 2018. FINDINGS: Stable cardiomegaly. Atherosclerosis of thoracic aorta is noted. No pneumothorax is noted. Lung bases are not completely visualized on this study. No definite pulmonary abnormality is noted within the visualized lung fields. Bony thorax is unremarkable. IMPRESSION: Limited exam as lung bases are not completely visualized. No definite acute abnormality is noted. Aortic Atherosclerosis (ICD10-I70.0). Electronically Signed   By: Marijo Conception M.D.   On: 02/08/2019 18:14      Procedures:   Subjective: Patient is feeling better, her dyspnea has improved and is back to baseline, no nausea or vomiting, no chest pain.   Discharge Exam: Vitals:   02/09/19 0216 02/09/19 0540  BP: (!) 160/71 (!) 167/77  Pulse: 96 90  Resp:  18  Temp:  98 F (36.7 C)  SpO2: 98% 97%   Vitals:   02/08/19 2115 02/09/19 0127 02/09/19 0216 02/09/19 0540  BP: (!) 152/76 (!) 172/104 (!) 160/71 (!) 167/77  Pulse: 100 97 96 90  Resp: 20 16  18   Temp:  98.1 F (36.7 C)  98 F (36.7 C)  TempSrc:  Oral  Oral  SpO2: 94% 97% 98% 97%    General: Not in pain or dyspnea  Neurology: Awake and alert, non focal  E ENT: no pallor, no icterus, oral mucosa moist Cardiovascular: No JVD. S1-S2 present, rhythmic, no gallops, rubs, or murmurs. No lower extremity edema. Pulmonary: positive breath sounds bilaterally, adequate air movement, no wheezing, rhonchi or rales. Gastrointestinal. Abdomen with no organomegaly, non tender, no rebound or guarding Skin. No rashes Musculoskeletal: no joint deformities   The results of significant diagnostics from this hospitalization (including imaging, microbiology,  ancillary and laboratory) are listed below for reference.     Microbiology: Recent Results (from the past 240 hour(s))  SARS Coronavirus 2 (CEPHEID - Performed in [place] hospital lab), Hosp Order     Status: None   Collection Time: 02/08/19  5:35 PM   Specimen: Nasopharyngeal Swab  Result Value Ref Range Status   SARS Coronavirus 2 NEGATIVE NEGATIVE Final    Comment: (NOTE) If result is NEGATIVE SARS-CoV-2 target nucleic acids are NOT DETECTED. The SARS-CoV-2 RNA is generally detectable in upper and lower  respiratory specimens during the acute phase of infection. The lowest  concentration of SARS-CoV-2 viral copies this assay can detect is 250  copies / mL. A negative result does not preclude SARS-CoV-2 infection  and should not be used as the sole basis for treatment or other  patient management decisions.  A negative result may occur with  improper specimen collection / handling, submission of specimen other  than nasopharyngeal swab, presence of viral mutation(s) within the  areas targeted by this assay, and inadequate number of viral copies  (<250 copies / mL). A negative result must be combined with clinical  observations, patient history, and epidemiological information. If result is POSITIVE SARS-CoV-2 target nucleic acids are DETECTED. The SARS-CoV-2 RNA is generally detectable in upper and lower  respiratory specimens dur ing the acute phase of infection.  Positive  results are indicative of active infection with SARS-CoV-2.  Clinical  correlation with patient history and other diagnostic information is  necessary to determine patient infection status.  Positive results do  not rule out bacterial infection or co-infection with other viruses. If result is PRESUMPTIVE POSTIVE SARS-CoV-2 nucleic acids MAY BE PRESENT.   A presumptive positive result was obtained on the submitted specimen  and confirmed on repeat testing.  While 2019 novel coronavirus  (SARS-CoV-2)  nucleic acids may be present in the submitted sample  additional confirmatory testing may be necessary for epidemiological  and / or clinical management purposes  to differentiate between  SARS-CoV-2 and other Sarbecovirus currently known to infect humans.  If clinically indicated additional testing with an alternate test  methodology [phone]) is advised. The SARS-CoV-2 RNA is generally  detectable in upper and lower respiratory sp ecimens during the acute  phase of infection. The expected result is Negative. Fact Sheet for Patients:  StrictlyIdeas.no Fact Sheet for Healthcare Providers: BankingDealers.co.za This test is not yet approved or cleared by the [place] FDA and has been authorized for detection and/or diagnosis of SARS-CoV-2 by FDA under an Emergency Use Authorization (EUA).  This EUA will remain in effect (meaning this test can be used) for the duration of the COVID-19 declaration under Section 564(b)(1) of the Act, 21 U.S.C. section 360bbb-3(b)(1), unless the authorization is terminated or revoked sooner. Performed at [facility], [place] [address], [place], [place] [address]      Labs: BNP (last 3 results) Recent Labs  12/24/18 2344  BNP 28.3   Basic Metabolic Panel: Recent Labs  Lab 02/08/19 1737 02/08/19 1819 02/09/19 0443  NA 141 142 138  K 4.3 4.2 4.3  CL 105  --  105  CO2 26  --  26  GLUCOSE 127*  --  232*  BUN 15  --  18  CREATININE 0.96  --  0.87  CALCIUM 9.0  --  8.7*   Liver Function Tests: Recent Labs  Lab 02/08/19 1737  AST 22  ALT 18  ALKPHOS 82  BILITOT 0.6  PROT 7.7  ALBUMIN 3.9   No results for input(s): LIPASE, AMYLASE in the last 168 hours. No results for input(s): AMMONIA in the last 168 hours. CBC: Recent Labs  Lab 02/08/19 1737 02/08/19 1819 02/09/19 0443  WBC 5.0  --  1.3*  NEUTROABS 1.4*  --   --   HGB 13.3 13.9 11.7*  HCT 42.2 41.0 36.1*  MCV 95.7  --   92.8  PLT 212  --  179   Cardiac Enzymes: No results for input(s): CKTOTAL, CKMB, CKMBINDEX, TROPONINI in the last 168 hours. BNP: Invalid input(s): POCBNP CBG: Recent Labs  Lab 02/09/19 0657  GLUCAP 151*   D-Dimer No results for input(s): DDIMER in the last 72 hours. Hgb A1c No results for input(s): HGBA1C in the last 72 hours. Lipid Profile No results for input(s): CHOL, HDL, LDLCALC, TRIG, CHOLHDL, LDLDIRECT in the last 72 hours. Thyroid function studies No results for input(s): TSH, T4TOTAL, T3FREE, THYROIDAB in the last 72 hours.  Invalid input(s): FREET3 Anemia work up No results for input(s): VITAMINB12, FOLATE, FERRITIN, TIBC, IRON, RETICCTPCT in the last 72 hours. Urinalysis    Component Value Date/Time   COLORURINE YELLOW 07/22/2013 0725   APPEARANCEUR CLEAR 07/22/2013 0725   LABSPEC 1.011 07/22/2013 0725   PHURINE 6.5 07/22/2013 0725   GLUCOSEU NEGATIVE 07/22/2013 0725   HGBUR TRACE (A) 07/22/2013 0725   BILIRUBINUR NEGATIVE 07/22/2013 0725   KETONESUR NEGATIVE 07/22/2013 0725   PROTEINUR NEGATIVE 07/22/2013 0725   UROBILINOGEN 0.2 07/22/2013 0725   NITRITE NEGATIVE 07/22/2013 0725   LEUKOCYTESUR NEGATIVE 07/22/2013 0725   Sepsis Labs Invalid input(s): PROCALCITONIN,  WBC,  LACTICIDVEN Microbiology Recent Results (from the past 240 hour(s))  SARS Coronavirus 2 (CEPHEID - Performed in [place] hospital lab), Hosp Order     Status: None   Collection Time: 02/08/19  5:35 PM   Specimen: Nasopharyngeal Swab  Result Value Ref Range Status   SARS Coronavirus 2 NEGATIVE NEGATIVE Final    Comment: (NOTE) If result is NEGATIVE SARS-CoV-2 target nucleic acids are NOT DETECTED. The SARS-CoV-2 RNA is generally detectable in upper and lower  respiratory specimens during the acute phase of infection. The lowest  concentration of SARS-CoV-2 viral copies this assay can detect is 250  copies / mL. A negative result does not preclude SARS-CoV-2 infection  and  should not be used as the sole basis for treatment or other  patient management decisions.  A negative result may occur with  improper specimen collection / handling, submission of specimen other  than nasopharyngeal swab, presence of viral mutation(s) within the  areas targeted by this assay, and inadequate number of viral copies  (<250 copies / mL). A negative result must be combined with clinical  observations, patient history, and epidemiological information. If result is POSITIVE SARS-CoV-2 target nucleic acids are DETECTED. The SARS-CoV-2 RNA is generally detectable in upper and lower  respiratory specimens dur ing the acute phase  of infection.  Positive  results are indicative of active infection with SARS-CoV-2.  Clinical  correlation with patient history and other diagnostic information is  necessary to determine patient infection status.  Positive results do  not rule out bacterial infection or co-infection with other viruses. If result is PRESUMPTIVE POSTIVE SARS-CoV-2 nucleic acids MAY BE PRESENT.   A presumptive positive result was obtained on the submitted specimen  and confirmed on repeat testing.  While 2019 novel coronavirus  (SARS-CoV-2) nucleic acids may be present in the submitted sample  additional confirmatory testing may be necessary for epidemiological  and / or clinical management purposes  to differentiate between  SARS-CoV-2 and other Sarbecovirus currently known to infect humans.  If clinically indicated additional testing with an alternate test  methodology [phone]) is advised. The SARS-CoV-2 RNA is generally  detectable in upper and lower respiratory sp ecimens during the acute  phase of infection. The expected result is Negative. Fact Sheet for Patients:  StrictlyIdeas.no Fact Sheet for Healthcare Providers: BankingDealers.co.za This test is not yet approved or cleared by the [place] FDA and has been  authorized for detection and/or diagnosis of SARS-CoV-2 by FDA under an Emergency Use Authorization (EUA).  This EUA will remain in effect (meaning this test can be used) for the duration of the COVID-19 declaration under Section 564(b)(1) of the Act, 21 U.S.C. section 360bbb-3(b)(1), unless the authorization is terminated or revoked sooner. Performed at [facility], [place] [address], [place], [place] [address]      Time coordinating discharge: 45 minutes  SIGNED:   Tawni Millers, MD  [facility] 02/09/2019, 9:08 AM

## 2019-02-09 NOTE — Progress Notes (Signed)
CRITICAL VALUE ALERT  Critical Value:  WBC 1.3  Date & Time Notied:  02/09/2019 @ 6644  Provider Notified: Silas [place], NP   Orders Received/Actions taken: awaiting orders

## 2019-02-09 NOTE — Progress Notes (Signed)
New Admission Note: ? Arrival Method: stretcher  Mental Orientation: A/O x 4  Telemetry: none Assessment: Completed Skin: Refer to flowsheet IV: Right AC Pain: none  Tubes: Safety Measures: Safety Fall Prevention Plan discussed with patient. Admission: Completed [facility] [place] Orientation: Patient has been orientated to the room, unit and the staff. Family: Orders have been reviewed and are being implemented. Will continue to monitor the patient. Call light has been placed within reach and bed alarm has been activated.  ? [employer], [place]

## 2019-02-09 NOTE — Plan of Care (Signed)
Patient able to walked into the bathroom with stand by assist without any oxygen and able tolerated it without any distress.

## 2019-02-09 NOTE — ED Notes (Signed)
ED TO INPATIENT HANDOFF REPORT  ED Nurse Name and Phone #:  Otila Kluver @ [phone]  S Name/Age/Gender Cory Kemp August 78 y.o. male Room/Bed: 016C/016C  Code Status   Code Status: Prior  Home/SNF/Other Home Patient oriented to: self, place, time and situation Is this baseline? Yes   Triage Complete: Triage complete  Chief Complaint SOB  Triage Note Pt arrives with SOB. RA sats 74%, pt placed on O2. Hx of COPD.   Allergies Allergies  Allergen Reactions  . Adhesive [Tape] Rash    No "PLASTIC" tape!!! Patient broke out in blisters!!    Level of Care/Admitting Diagnosis ED Disposition    ED Disposition Condition [facility] Area: [place] [[id number]]  Level of Care: Med-Surg [16]  I expect the patient will be discharged within 24 hours: Yes  LOW acuity---Tx typically complete <24 hrs---ACUTE conditions typically can be evaluated <24 hours---LABS likely to return to acceptable levels <24 hours---IS near functional baseline---EXPECTED to return to current living arrangement---NOT newly hypoxic: Does not meet criteria for 5C-Observation unit  Covid Evaluation: Confirmed COVID Negative  Diagnosis: COPD with acute exacerbation [facility]) [[id number]]  Admitting Physician: Vianne Bulls [[id number]]  Attending Physician: Vianne Bulls [[id number]]  PT Class (Do Not Modify): Observation [104]  PT Acc Code (Do Not Modify): Observation [10022]       B Medical/Surgery History Past Medical History:  Diagnosis Date  . Alcohol abuse   . Anemia   . Arthritis   . Asthma   . B12 deficiency 11/03/2013  . BPH (benign prostatic hypertrophy)   . COPD (chronic obstructive pulmonary disease) ([place])   . Dementia ([place])    ?  [name] Kitchen Diabetes mellitus ([place]) 01/10/2012  . Femoral hernia, right 03/27/2012  . Glucose intolerance (impaired glucose tolerance)   . Hyperlipidemia   . Hypertension   . Incisional hernias - swiss-cheese-type periumbilical 0/03/1447  . Inguinal hernia  - right 01/10/2012  . Leukopenia 01/29/2013  . Monocytosis 01/29/2013  . Normochromic anemia 01/29/2013  . Osteopenia    Past Surgical History:  Procedure Laterality Date  . HEMORRHOID SURGERY    . HERNIA REPAIR    . INGUINAL HERNIA REPAIR  02/24/2012   Procedure: LAPAROSCOPIC INGUINAL HERNIA;  Surgeon: Adin Hector, MD;  Location: [facility];  Service: General;  Laterality: N/A;  . TUMOR EXCISION     intestinal after SBO ( Benign), appendectomy  . VENTRAL HERNIA REPAIR  02/24/2012   Procedure: LAPAROSCOPIC VENTRAL HERNIA;  Surgeon: Adin Hector, MD;  Location: [facility];  Service: General;  Laterality: N/A;  lysis of adhesions, excision of abdominal wall lipomae     A IV Location/Drains/Wounds Patient Lines/Drains/Airways Status   Active Line/Drains/Airways    Name:   Placement date:   Placement time:   Site:   Days:   Peripheral IV 02/08/19 Right Antecubital   02/08/19    1740    Antecubital   1          Intake/Output Last 24 hours No intake or output data in the 24 hours ending 02/09/19 0013  Labs/Imaging Results for orders placed or performed during the hospital encounter of 02/08/19 (from the past 48 hour(s))  SARS Coronavirus 2 (CEPHEID - Performed in [place] hospital lab), Hosp Order     Status: None   Collection Time: 02/08/19  5:35 PM   Specimen: Nasopharyngeal Swab  Result Value Ref Range   SARS Coronavirus 2 NEGATIVE NEGATIVE    Comment: (  NOTE) If result is NEGATIVE SARS-CoV-2 target nucleic acids are NOT DETECTED. The SARS-CoV-2 RNA is generally detectable in upper and lower  respiratory specimens during the acute phase of infection. The lowest  concentration of SARS-CoV-2 viral copies this assay can detect is 250  copies / mL. A negative result does not preclude SARS-CoV-2 infection  and should not be used as the sole basis for treatment or other  patient management decisions.  A negative result may occur with  improper specimen collection / handling,  submission of specimen other  than nasopharyngeal swab, presence of viral mutation(s) within the  areas targeted by this assay, and inadequate number of viral copies  (<250 copies / mL). A negative result must be combined with clinical  observations, patient history, and epidemiological information. If result is POSITIVE SARS-CoV-2 target nucleic acids are DETECTED. The SARS-CoV-2 RNA is generally detectable in upper and lower  respiratory specimens dur ing the acute phase of infection.  Positive  results are indicative of active infection with SARS-CoV-2.  Clinical  correlation with patient history and other diagnostic information is  necessary to determine patient infection status.  Positive results do  not rule out bacterial infection or co-infection with other viruses. If result is PRESUMPTIVE POSTIVE SARS-CoV-2 nucleic acids MAY BE PRESENT.   A presumptive positive result was obtained on the submitted specimen  and confirmed on repeat testing.  While 2019 novel coronavirus  (SARS-CoV-2) nucleic acids may be present in the submitted sample  additional confirmatory testing may be necessary for epidemiological  and / or clinical management purposes  to differentiate between  SARS-CoV-2 and other Sarbecovirus currently known to infect humans.  If clinically indicated additional testing with an alternate test  methodology [phone]) is advised. The SARS-CoV-2 RNA is generally  detectable in upper and lower respiratory sp ecimens during the acute  phase of infection. The expected result is Negative. Fact Sheet for Patients:  StrictlyIdeas.no Fact Sheet for Healthcare Providers: BankingDealers.co.za This test is not yet approved or cleared by the [place] FDA and has been authorized for detection and/or diagnosis of SARS-CoV-2 by FDA under an Emergency Use Authorization (EUA).  This EUA will remain in effect (meaning this test can be  used) for the duration of the COVID-19 declaration under Section 564(b)(1) of the Act, 21 U.S.C. section 360bbb-3(b)(1), unless the authorization is terminated or revoked sooner. Performed at [facility], [place] [address] [place], [place] [address]   Comprehensive metabolic panel     Status: Abnormal   Collection Time: 02/08/19  5:37 PM  Result Value Ref Range   Sodium 141 135 - 145 mmol/L   Potassium 4.3 3.5 - 5.1 mmol/L   Chloride 105 98 - 111 mmol/L   CO2 26 22 - 32 mmol/L   Glucose, Bld 127 (H) 70 - 99 mg/dL   BUN 15 8 - 23 mg/dL   Creatinine, Ser 0.96 0.61 - 1.24 mg/dL   Calcium 9.0 8.9 - 10.3 mg/dL   Total Protein 7.7 6.5 - 8.1 g/dL   Albumin 3.9 3.5 - 5.0 g/dL   AST 22 15 - 41 U/L   ALT 18 0 - 44 U/L   Alkaline Phosphatase 82 38 - 126 U/L   Total Bilirubin 0.6 0.3 - 1.2 mg/dL   GFR calc non Af Amer >60 >60 mL/min   GFR calc Af Amer >60 >60 mL/min   Anion gap 10 5 - 15    Comment: Performed at [facility],  [address], [place], [place] [address]  Troponin I (High Sensitivity)     Status: None   Collection Time: 02/08/19  5:37 PM  Result Value Ref Range   Troponin I (High Sensitivity) 8 <18 ng/L    Comment: (NOTE) Elevated high sensitivity troponin I (hsTnI) values and significant  changes across serial measurements may suggest ACS but many other  chronic and acute conditions are known to elevate hsTnI results.  Refer to the "Links" section for chest pain algorithms and additional  guidance. Performed at [facility], [place] [address], [place], [place] [address]   CBC with Differential     Status: Abnormal   Collection Time: 02/08/19  5:37 PM  Result Value Ref Range   WBC 5.0 4.0 - 10.5 K/uL   RBC 4.41 4.22 - 5.81 MIL/uL   Hemoglobin 13.3 13.0 - 17.0 g/dL   HCT 42.2 39.0 - 52.0 %   MCV 95.7 80.0 - 100.0 fL   MCH 30.2 26.0 - 34.0 pg   MCHC 31.5 30.0 - 36.0 g/dL   RDW 13.3 11.5 - 15.5 %   Platelets 212 150 - 400 K/uL   nRBC 0.0 0.0 - 0.2 %    Neutrophils Relative % 28 %   Neutro Abs 1.4 (L) 1.7 - 7.7 K/uL   Lymphocytes Relative 51 %   Lymphs Abs 2.6 0.7 - 4.0 K/uL   Monocytes Relative 19 %   Monocytes Absolute 0.9 0.1 - 1.0 K/uL   Eosinophils Relative 1 %   Eosinophils Absolute 0.0 0.0 - 0.5 K/uL   Basophils Relative 0 %   Basophils Absolute 0.0 0.0 - 0.1 K/uL   Immature Granulocytes 1 %   Abs Immature Granulocytes 0.03 0.00 - 0.07 K/uL    Comment: Performed at [facility] [address], [place], [place] [address]  Lactic acid, plasma     Status: Abnormal   Collection Time: 02/08/19  6:15 PM  Result Value Ref Range   Lactic Acid, Venous 3.3 (HH) 0.5 - 1.9 mmol/L    Comment: CRITICAL RESULT CALLED TO, READ BACK BY AND VERIFIED WITH: Rochele Raring 1907 02/08/2019 WBOND Performed at [facility], [place] [address] [place], [place] [address]   POCT I-Stat EG7     Status: Abnormal   Collection Time: 02/08/19  6:19 PM  Result Value Ref Range   pH, Ven 7.297 7.250 - 7.430   pCO2, Ven 55.2 44.0 - 60.0 mmHg   pO2, Ven 69.0 (H) 32.0 - 45.0 mmHg   Bicarbonate 27.0 20.0 - 28.0 mmol/L   TCO2 29 22 - 32 mmol/L   O2 Saturation 91.0 %   Acid-base deficit 1.0 0.0 - 2.0 mmol/L   Sodium 142 135 - 145 mmol/L   Potassium 4.2 3.5 - 5.1 mmol/L   Calcium, Ion 1.15 1.15 - 1.40 mmol/L   HCT 41.0 39.0 - 52.0 %   Hemoglobin 13.9 13.0 - 17.0 g/dL   Patient temperature HIDE    Sample type VENOUS   Troponin I (High Sensitivity)     Status: None   Collection Time: 02/08/19  8:47 PM  Result Value Ref Range   Troponin I (High Sensitivity) 12 <18 ng/L    Comment: (NOTE) Elevated high sensitivity troponin I (hsTnI) values and significant  changes across serial measurements may suggest ACS but many other  chronic and acute conditions are known to elevate hsTnI results.  Refer to the "Links" section for chest pain algorithms and additional  guidance. Performed at [facility], [place] [address], [place], [place] [address]    Lactic acid, plasma     Status: None   Collection Time: 02/08/19  8:47 PM  Result Value Ref Range   Lactic Acid, Venous 0.6 0.5 - 1.9 mmol/L    Comment: Performed at [facility] [address], [place], [place] [address]   Dg Chest 2 View  Result Date: 02/08/2019 CLINICAL DATA:  Shortness of breath EXAM: CHEST - 2 VIEW COMPARISON:  02/08/2019, CT 12/25/2018, radiograph 12/24/2018, 01/03/2018 FINDINGS: Hyperinflation with emphysematous disease. Streaky atelectasis at the left base. Bronchitic changes and scarring at both bases. No pleural effusion. No focal consolidation. Stable cardiomediastinal silhouette with aortic atherosclerosis. No pneumothorax IMPRESSION: 1. Hyperinflation with emphysematous disease. 2. Subsegmental atelectasis left base. Bronchitic changes and scarring at both bases Electronically Signed   By: Donavan Foil M.D.   On: 02/08/2019 23:12   Dg Chest Portable 1 View  Result Date: 02/08/2019 CLINICAL DATA:  Shortness of breath. EXAM: PORTABLE CHEST 1 VIEW COMPARISON:  Radiographs of Dec 24, 2018. FINDINGS: Stable cardiomegaly. Atherosclerosis of thoracic aorta is noted. No pneumothorax is noted. Lung bases are not completely visualized on this study. No definite pulmonary abnormality is noted within the visualized lung fields. Bony thorax is unremarkable. IMPRESSION: Limited exam as lung bases are not completely visualized. No definite acute abnormality is noted. Aortic Atherosclerosis (ICD10-I70.0). Electronically Signed   By: Marijo Conception M.D.   On: 02/08/2019 18:14    Pending Labs Unresulted Labs (From admission, onward)    Start     Ordered   02/08/19 1748  Blood gas, venous (at [facility] and [facility], not at [facility])  ONCE - STAT,   STAT     02/08/19 1748   Signed and Held  Creatinine, serum  (enoxaparin (LOVENOX)    CrCl >/= 30 ml/min)  Weekly,   R    Comments: while on enoxaparin therapy    Signed and Held   Signed and Held  Basic metabolic panel  Tomorrow morning,   R      Signed and Held   Signed and Held  CBC  Tomorrow morning,   R     Signed and Held   Signed and Held  Culture, sputum-assessment  Once,   R     Signed and Held          Vitals/Pain Today's Vitals   02/08/19 2030 02/08/19 2048 02/08/19 2100 02/08/19 2115  BP:   (!) 172/79 (!) 152/76  Pulse: 91  84 100  Resp: 20  (!) 27 20  Temp:      TempSrc:      SpO2: 95%  96% 94%  PainSc:  0-No pain      Isolation Precautions No active isolations  Medications Medications  albuterol (PROVENTIL) (2.5 MG/3ML) 0.083% nebulizer solution 2.5 mg (has no administration in time range)  methylPREDNISolone sodium succinate (SOLU-MEDROL) 125 mg/2 mL injection 60 mg (has no administration in time range)  albuterol (VENTOLIN HFA) 108 (90 Base) MCG/ACT inhaler 8 puff (8 puffs Inhalation Given 02/08/19 1739)  methylPREDNISolone sodium succinate (SOLU-MEDROL) 125 mg/2 mL injection 125 mg (125 mg Intravenous Given 02/08/19 1807)    Mobility walks Moderate fall risk   Focused Assessments Cardiac Assessment Handoff:  Cardiac Rhythm: Sinus tachycardia Lab Results  Component Value Date   TROPONINI <0.03 12/24/2018   Lab Results  Component Value Date   DDIMER 2.50 (H) 12/24/2018   Does  the Patient currently have chest pain? No  , Pulmonary Assessment Handoff:  Lung sounds: Bilateral Breath Sounds: Coarse crackles L Breath Sounds: Coarse crackles R Breath Sounds: Diminished O2 Device: Nasal Cannula O2 Flow Rate (L/min): 4 L/min(baseline)      R Recommendations: See Admitting Provider Note  Report given to:   Additional Notes:  Pt lives with a room mate. Has not taken his BP meds this evening yet. A&O X4. On home O2 @ 3-4 lpm

## 2019-02-09 NOTE — Plan of Care (Signed)
Patient able to ambulate by himself into the bathroom without oxygen though he is on chronic oxygen user.Not complaining of anything.

## 2019-02-11 ENCOUNTER — Emergency Department (HOSPITAL_COMMUNITY): Payer: Medicare HMO

## 2019-02-11 ENCOUNTER — Encounter (HOSPITAL_COMMUNITY): Payer: Self-pay

## 2019-02-11 ENCOUNTER — Inpatient Hospital Stay (HOSPITAL_COMMUNITY)
Admission: EM | Admit: 2019-02-11 | Discharge: 2019-02-13 | DRG: 193 | Disposition: A | Discharge status: Home / Self Care | Payer: Medicare HMO | Attending: Internal Medicine | Admitting: Internal Medicine

## 2019-02-11 ENCOUNTER — Inpatient Hospital Stay (HOSPITAL_COMMUNITY): Payer: Medicare HMO

## 2019-02-11 ENCOUNTER — Other Ambulatory Visit: Payer: Self-pay

## 2019-02-11 DIAGNOSIS — E1165 Type 2 diabetes mellitus with hyperglycemia: Secondary | ICD-10-CM | POA: Diagnosis present

## 2019-02-11 DIAGNOSIS — J9611 Chronic respiratory failure with hypoxia: Secondary | ICD-10-CM | POA: Diagnosis not present

## 2019-02-11 DIAGNOSIS — R069 Unspecified abnormalities of breathing: Secondary | ICD-10-CM | POA: Diagnosis not present

## 2019-02-11 DIAGNOSIS — J439 Emphysema, unspecified: Secondary | ICD-10-CM | POA: Diagnosis present

## 2019-02-11 DIAGNOSIS — Z791 Long term (current) use of non-steroidal anti-inflammatories (NSAID): Secondary | ICD-10-CM | POA: Diagnosis not present

## 2019-02-11 DIAGNOSIS — J189 Pneumonia, unspecified organism: Secondary | ICD-10-CM | POA: Diagnosis not present

## 2019-02-11 DIAGNOSIS — J441 Chronic obstructive pulmonary disease with (acute) exacerbation: Secondary | ICD-10-CM | POA: Diagnosis not present

## 2019-02-11 DIAGNOSIS — F329 Major depressive disorder, single episode, unspecified: Secondary | ICD-10-CM | POA: Diagnosis present

## 2019-02-11 DIAGNOSIS — I4891 Unspecified atrial fibrillation: Secondary | ICD-10-CM | POA: Diagnosis present

## 2019-02-11 DIAGNOSIS — Z9981 Dependence on supplemental oxygen: Secondary | ICD-10-CM | POA: Diagnosis not present

## 2019-02-11 DIAGNOSIS — J9621 Acute and chronic respiratory failure with hypoxia: Secondary | ICD-10-CM

## 2019-02-11 DIAGNOSIS — R0902 Hypoxemia: Secondary | ICD-10-CM

## 2019-02-11 DIAGNOSIS — Z87891 Personal history of nicotine dependence: Secondary | ICD-10-CM

## 2019-02-11 DIAGNOSIS — J449 Chronic obstructive pulmonary disease, unspecified: Secondary | ICD-10-CM | POA: Diagnosis present

## 2019-02-11 DIAGNOSIS — F419 Anxiety disorder, unspecified: Secondary | ICD-10-CM | POA: Diagnosis present

## 2019-02-11 DIAGNOSIS — G894 Chronic pain syndrome: Secondary | ICD-10-CM | POA: Diagnosis present

## 2019-02-11 DIAGNOSIS — R0602 Shortness of breath: Secondary | ICD-10-CM | POA: Diagnosis not present

## 2019-02-11 DIAGNOSIS — Z20828 Contact with and (suspected) exposure to other viral communicable diseases: Secondary | ICD-10-CM | POA: Diagnosis not present

## 2019-02-11 DIAGNOSIS — G4733 Obstructive sleep apnea (adult) (pediatric): Secondary | ICD-10-CM

## 2019-02-11 DIAGNOSIS — Z801 Family history of malignant neoplasm of trachea, bronchus and lung: Secondary | ICD-10-CM

## 2019-02-11 DIAGNOSIS — I10 Essential (primary) hypertension: Secondary | ICD-10-CM

## 2019-02-11 DIAGNOSIS — D72819 Decreased white blood cell count, unspecified: Secondary | ICD-10-CM | POA: Diagnosis present

## 2019-02-11 DIAGNOSIS — R000 Tachycardia, unspecified: Secondary | ICD-10-CM | POA: Diagnosis not present

## 2019-02-11 DIAGNOSIS — Z7952 Long term (current) use of systemic steroids: Secondary | ICD-10-CM | POA: Diagnosis not present

## 2019-02-11 DIAGNOSIS — F0390 Unspecified dementia without behavioral disturbance: Secondary | ICD-10-CM | POA: Diagnosis present

## 2019-02-11 DIAGNOSIS — E785 Hyperlipidemia, unspecified: Secondary | ICD-10-CM | POA: Diagnosis present

## 2019-02-11 DIAGNOSIS — E119 Type 2 diabetes mellitus without complications: Secondary | ICD-10-CM | POA: Diagnosis not present

## 2019-02-11 DIAGNOSIS — Z79899 Other long term (current) drug therapy: Secondary | ICD-10-CM | POA: Diagnosis not present

## 2019-02-11 DIAGNOSIS — Z888 Allergy status to other drugs, medicaments and biological substances status: Secondary | ICD-10-CM | POA: Diagnosis not present

## 2019-02-11 DIAGNOSIS — N400 Enlarged prostate without lower urinary tract symptoms: Secondary | ICD-10-CM | POA: Diagnosis present

## 2019-02-11 DIAGNOSIS — I491 Atrial premature depolarization: Secondary | ICD-10-CM | POA: Diagnosis not present

## 2019-02-11 DIAGNOSIS — Z833 Family history of diabetes mellitus: Secondary | ICD-10-CM

## 2019-02-11 DIAGNOSIS — E538 Deficiency of other specified B group vitamins: Secondary | ICD-10-CM | POA: Diagnosis not present

## 2019-02-11 DIAGNOSIS — D649 Anemia, unspecified: Secondary | ICD-10-CM

## 2019-02-11 DIAGNOSIS — J9601 Acute respiratory failure with hypoxia: Secondary | ICD-10-CM | POA: Diagnosis not present

## 2019-02-11 DIAGNOSIS — M8580 Other specified disorders of bone density and structure, unspecified site: Secondary | ICD-10-CM | POA: Diagnosis present

## 2019-02-11 LAB — URINALYSIS, COMPLETE (UACMP) WITH MICROSCOPIC
Bacteria, UA: NONE SEEN
Bilirubin Urine: NEGATIVE
Glucose, UA: NEGATIVE mg/dL
Ketones, ur: NEGATIVE mg/dL
Leukocytes,Ua: NEGATIVE
Nitrite: NEGATIVE
Protein, ur: 30 mg/dL — AB
Specific Gravity, Urine: 1.035 — ABNORMAL HIGH (ref 1.005–1.030)
pH: 5 (ref 5.0–8.0)

## 2019-02-11 LAB — CBC WITH DIFFERENTIAL/PLATELET
Abs Immature Granulocytes: 0.19 10*3/uL — ABNORMAL HIGH (ref 0.00–0.07)
Basophils Absolute: 0 10*3/uL (ref 0.0–0.1)
Basophils Relative: 0 %
Eosinophils Absolute: 0 10*3/uL (ref 0.0–0.5)
Eosinophils Relative: 1 %
HCT: 41 % (ref 39.0–52.0)
Hemoglobin: 12.6 g/dL — ABNORMAL LOW (ref 13.0–17.0)
Immature Granulocytes: 5 %
Lymphocytes Relative: 23 %
Lymphs Abs: 0.9 10*3/uL (ref 0.7–4.0)
MCH: 30.3 pg (ref 26.0–34.0)
MCHC: 30.7 g/dL (ref 30.0–36.0)
MCV: 98.6 fL (ref 80.0–100.0)
Monocytes Absolute: 0.3 10*3/uL (ref 0.1–1.0)
Monocytes Relative: 7 %
Neutro Abs: 2.6 10*3/uL (ref 1.7–7.7)
Neutrophils Relative %: 64 %
Platelets: 192 10*3/uL (ref 150–400)
RBC: 4.16 MIL/uL — ABNORMAL LOW (ref 4.22–5.81)
RDW: 14 % (ref 11.5–15.5)
WBC: 4 10*3/uL (ref 4.0–10.5)
nRBC: 0 % (ref 0.0–0.2)

## 2019-02-11 LAB — COMPREHENSIVE METABOLIC PANEL
ALT: 27 U/L (ref 0–44)
AST: 25 U/L (ref 15–41)
Albumin: 3.9 g/dL (ref 3.5–5.0)
Alkaline Phosphatase: 69 U/L (ref 38–126)
Anion gap: 10 (ref 5–15)
BUN: 27 mg/dL — ABNORMAL HIGH (ref 8–23)
CO2: 27 mmol/L (ref 22–32)
Calcium: 8.7 mg/dL — ABNORMAL LOW (ref 8.9–10.3)
Chloride: 105 mmol/L (ref 98–111)
Creatinine, Ser: 0.93 mg/dL (ref 0.61–1.24)
GFR calc Af Amer: >60 mL/min (ref >60)
GFR calc non Af Amer: >60 mL/min (ref >60)
Glucose, Bld: 206 mg/dL — ABNORMAL HIGH (ref 70–99)
Potassium: 4.4 mmol/L (ref 3.5–5.1)
Sodium: 142 mmol/L (ref 135–145)
Total Bilirubin: 0.4 mg/dL (ref 0.3–1.2)
Total Protein: 7.2 g/dL (ref 6.5–8.1)

## 2019-02-11 LAB — MRSA PCR SCREENING: MRSA by PCR: NEGATIVE

## 2019-02-11 LAB — GLUCOSE, CAPILLARY
Glucose-Capillary: 179 mg/dL — ABNORMAL HIGH (ref 70–99)
Glucose-Capillary: 140 mg/dL — ABNORMAL HIGH (ref 70–99)

## 2019-02-11 LAB — LACTIC ACID, PLASMA: Lactic Acid, Venous: 0.9 mmol/L (ref 0.5–1.9)

## 2019-02-11 LAB — SARS CORONAVIRUS 2 BY RT PCR (HOSPITAL ORDER, PERFORMED IN ~~LOC~~ HOSPITAL LAB): SARS Coronavirus 2: NEGATIVE

## 2019-02-11 MED ORDER — INSULIN ASPART 100 UNIT/ML ~~LOC~~ SOLN
0.0000 [IU] | Freq: Three times a day (TID) | SUBCUTANEOUS | Status: DC
  Administered 2019-02-11: 2 [IU] via SUBCUTANEOUS
  Administered 2019-02-12 (×2): 1 [IU] via SUBCUTANEOUS
  Administered 2019-02-12: 2 [IU] via SUBCUTANEOUS
  Filled 2019-02-11: qty 0.09

## 2019-02-11 MED ORDER — IPRATROPIUM BROMIDE 0.02 % IN SOLN
0.5000 mg | Freq: Four times a day (QID) | RESPIRATORY_TRACT | Status: DC
  Administered 2019-02-11 – 2019-02-12 (×6): 0.5 mg via RESPIRATORY_TRACT
  Filled 2019-02-11 (×6): qty 2.5

## 2019-02-11 MED ORDER — ALBUTEROL SULFATE HFA 108 (90 BASE) MCG/ACT IN AERS
8.0000 | INHALATION_SPRAY | Freq: Once | RESPIRATORY_TRACT | Status: AC
  Administered 2019-02-11: 8 via RESPIRATORY_TRACT
  Filled 2019-02-11: qty 6.7

## 2019-02-11 MED ORDER — LOSARTAN POTASSIUM 50 MG PO TABS
50.0000 mg | ORAL_TABLET | Freq: Every day | ORAL | Status: DC
  Administered 2019-02-11 – 2019-02-12 (×2): 50 mg via ORAL
  Filled 2019-02-11 (×2): qty 1

## 2019-02-11 MED ORDER — IOHEXOL 350 MG/ML SOLN
100.0000 mL | Freq: Once | INTRAVENOUS | Status: AC | PRN
  Administered 2019-02-11: 100 mL via INTRAVENOUS

## 2019-02-11 MED ORDER — ESCITALOPRAM OXALATE 10 MG PO TABS
5.0000 mg | ORAL_TABLET | Freq: Every day | ORAL | Status: DC
  Administered 2019-02-11 – 2019-02-13 (×3): 5 mg via ORAL
  Filled 2019-02-11 (×3): qty 0.5
  Filled 2019-02-11: qty 1

## 2019-02-11 MED ORDER — VITAMIN D3 25 MCG (1000 UNIT) PO TABS
1000.0000 [IU] | ORAL_TABLET | Freq: Every day | ORAL | Status: DC
  Administered 2019-02-11 – 2019-02-12 (×2): 1000 [IU] via ORAL
  Filled 2019-02-11 (×2): qty 1

## 2019-02-11 MED ORDER — SODIUM CHLORIDE 0.9 % IV SOLN
100.0000 mg | Freq: Two times a day (BID) | INTRAVENOUS | Status: DC
  Administered 2019-02-11 – 2019-02-13 (×4): 100 mg via INTRAVENOUS
  Filled 2019-02-11 (×6): qty 100

## 2019-02-11 MED ORDER — ORAL CARE MOUTH RINSE
15.0000 mL | Freq: Two times a day (BID) | OROMUCOSAL | Status: DC
  Administered 2019-02-11 – 2019-02-13 (×3): 15 mL via OROMUCOSAL

## 2019-02-11 MED ORDER — INSULIN ASPART 100 UNIT/ML ~~LOC~~ SOLN
0.0000 [IU] | Freq: Every day | SUBCUTANEOUS | Status: DC
  Filled 2019-02-11: qty 0.05

## 2019-02-11 MED ORDER — CYANOCOBALAMIN 250 MCG PO TABS
250.0000 ug | ORAL_TABLET | Freq: Every day | ORAL | Status: DC
  Administered 2019-02-11 – 2019-02-12 (×2): 250 ug via ORAL
  Filled 2019-02-11 (×3): qty 1

## 2019-02-11 MED ORDER — LEVALBUTEROL HCL 0.63 MG/3ML IN NEBU
0.6300 mg | INHALATION_SOLUTION | Freq: Four times a day (QID) | RESPIRATORY_TRACT | Status: DC
  Administered 2019-02-11 – 2019-02-12 (×6): 0.63 mg via RESPIRATORY_TRACT
  Filled 2019-02-11 (×6): qty 3

## 2019-02-11 MED ORDER — ENOXAPARIN SODIUM 40 MG/0.4ML ~~LOC~~ SOLN
40.0000 mg | SUBCUTANEOUS | Status: DC
  Administered 2019-02-11 – 2019-02-13 (×3): 40 mg via SUBCUTANEOUS
  Filled 2019-02-11 (×3): qty 0.4

## 2019-02-11 MED ORDER — SODIUM CHLORIDE 0.9 % IV SOLN
2.0000 g | Freq: Once | INTRAVENOUS | Status: AC
  Administered 2019-02-11: 2 g via INTRAVENOUS
  Filled 2019-02-11: qty 2

## 2019-02-11 MED ORDER — AEROCHAMBER Z-STAT PLUS/MEDIUM MISC
1.0000 | Freq: Once | Status: AC
  Administered 2019-02-11: 1
  Filled 2019-02-11 (×2): qty 1

## 2019-02-11 MED ORDER — CYCLOBENZAPRINE HCL 10 MG PO TABS
10.0000 mg | ORAL_TABLET | Freq: Two times a day (BID) | ORAL | Status: DC | PRN
  Filled 2019-02-11: qty 1

## 2019-02-11 MED ORDER — BUDESONIDE 0.5 MG/2ML IN SUSP: 2.0000 mg | Freq: Four times a day (QID) | RESPIRATORY_TRACT | Status: DC

## 2019-02-11 MED ORDER — VANCOMYCIN HCL 10 G IV SOLR
2000.0000 mg | Freq: Once | INTRAVENOUS | Status: AC
  Administered 2019-02-11: 2000 mg via INTRAVENOUS
  Filled 2019-02-11: qty 2000

## 2019-02-11 MED ORDER — SODIUM CHLORIDE (PF) 0.9 % IJ SOLN
INTRAMUSCULAR | Status: AC
  Administered 2019-02-11: 12:00:00
  Filled 2019-02-11: qty 50

## 2019-02-11 MED ORDER — IPRATROPIUM BROMIDE HFA 17 MCG/ACT IN AERS
2.0000 | INHALATION_SPRAY | Freq: Once | RESPIRATORY_TRACT | Status: AC
  Administered 2019-02-11: 2 via RESPIRATORY_TRACT
  Filled 2019-02-11: qty 12.9

## 2019-02-11 MED ORDER — HYDRALAZINE HCL 20 MG/ML IJ SOLN
10.0000 mg | Freq: Four times a day (QID) | INTRAMUSCULAR | Status: DC | PRN
  Administered 2019-02-11: 10 mg via INTRAVENOUS
  Filled 2019-02-11 (×2): qty 1

## 2019-02-11 MED ORDER — ARFORMOTEROL TARTRATE 15 MCG/2ML IN NEBU
15.0000 ug | INHALATION_SOLUTION | Freq: Two times a day (BID) | RESPIRATORY_TRACT | Status: DC
  Administered 2019-02-11 – 2019-02-13 (×4): 15 ug via RESPIRATORY_TRACT
  Filled 2019-02-11 (×6): qty 2

## 2019-02-11 MED ORDER — HYDRALAZINE HCL 20 MG/ML IJ SOLN
10.0000 mg | INTRAMUSCULAR | Status: DC | PRN
  Administered 2019-02-11 – 2019-02-12 (×2): 10 mg via INTRAVENOUS
  Filled 2019-02-11: qty 1

## 2019-02-11 MED ORDER — BUDESONIDE 0.5 MG/2ML IN SUSP
0.5000 mg | Freq: Two times a day (BID) | RESPIRATORY_TRACT | Status: DC
  Administered 2019-02-11 – 2019-02-13 (×4): 0.5 mg via RESPIRATORY_TRACT
  Filled 2019-02-11 (×4): qty 2

## 2019-02-11 MED ORDER — SIMVASTATIN 40 MG PO TABS
40.0000 mg | ORAL_TABLET | Freq: Every day | ORAL | Status: DC
  Administered 2019-02-11 – 2019-02-12 (×2): 40 mg via ORAL
  Filled 2019-02-11 (×2): qty 1

## 2019-02-11 MED ORDER — OXYMETAZOLINE HCL 0.05 % NA SOLN
1.0000 | NASAL | Status: DC
  Administered 2019-02-11 – 2019-02-13 (×14): 1 via NASAL
  Filled 2019-02-11: qty 15

## 2019-02-11 MED ORDER — GUAIFENESIN ER 600 MG PO TB12
1200.0000 mg | ORAL_TABLET | Freq: Two times a day (BID) | ORAL | Status: DC
  Administered 2019-02-11 – 2019-02-13 (×5): 1200 mg via ORAL
  Filled 2019-02-11 (×5): qty 2

## 2019-02-11 MED ORDER — METHYLPREDNISOLONE SODIUM SUCC 125 MG IJ SOLR
60.0000 mg | Freq: Four times a day (QID) | INTRAMUSCULAR | Status: DC
  Administered 2019-02-11 – 2019-02-12 (×5): 60 mg via INTRAVENOUS
  Filled 2019-02-11 (×5): qty 2

## 2019-02-11 NOTE — ED Notes (Signed)
Bed: RESB Expected date:  Expected time:  Means of arrival:  Comments: EMS NRB

## 2019-02-11 NOTE — ED Notes (Signed)
ED TO INPATIENT HANDOFF REPORT  ED Nurse Name and Phone #:  Sausha Raymond [phone]  S Name/Age/Gender Cory Kemp 78 y.o. male Room/Bed: RESB/RESB  Code Status   Code Status: Prior  Home/SNF/Other Home Patient oriented to: self, place, time and situation Is this baseline? Yes   Triage Complete: Triage complete  Chief Complaint shob  Triage Note Pt BIBA [facility] home, [facility]o [facility]. Pt recently d/c from [facility] with PNA. Upon fire arrival, pt was satting at 50% 2L [place]. Pt was 97% on NRB with EMS.  Pt received epi IM en route, along with 125 solumedrol and 2g of mag.    Allergies Allergies  Allergen Reactions  . Adhesive [Tape] Rash    No "PLASTIC" tape!!! Patient broke out in blisters!!    Level of Care/Admitting Diagnosis ED Disposition    ED Disposition Condition Comment   Admit  Hospital Area: [place] [[id number]]  Level of Care: Stepdown [14]  Admit to SDU based on following criteria: Respiratory Distress:  Frequent assessment and/or intervention to maintain adequate ventilation/respiration, pulmonary toilet, and respiratory treatment.  Covid Evaluation: Confirmed COVID Negative  Diagnosis: Acute exacerbation of chronic obstructive pulmonary disease (COPD) [facility]) [[id number]]  Admitting Physician: [place], [place] [[id number]]  Attending Physician: Raiford Noble LATIF [id number]  Estimated length of stay: past midnight tomorrow  Certification:: I certify this patient will need inpatient services for at least 2 midnights  PT Class (Do Not Modify): Inpatient [101]  PT Acc Code (Do Not Modify): Private [1]       B Medical/Surgery History Past Medical History:  Diagnosis Date  . Alcohol abuse   . Anemia   . Arthritis   . Asthma   . B12 deficiency 11/03/2013  . BPH (benign prostatic hypertrophy)   . COPD (chronic obstructive pulmonary disease) ([place])   . Dementia ([place])    ?  [name] Kitchen Diabetes mellitus ([place]) 01/10/2012  . Femoral hernia, right 03/27/2012  . Glucose  intolerance (impaired glucose tolerance)   . Hyperlipidemia   . Hypertension   . Incisional hernias - swiss-cheese-type periumbilical 0/08/930  . Inguinal hernia - right 01/10/2012  . Leukopenia 01/29/2013  . Monocytosis 01/29/2013  . Normochromic anemia 01/29/2013  . Osteopenia    Past Surgical History:  Procedure Laterality Date  . HEMORRHOID SURGERY    . HERNIA REPAIR    . INGUINAL HERNIA REPAIR  02/24/2012   Procedure: LAPAROSCOPIC INGUINAL HERNIA;  Surgeon: Adin Hector, MD;  [facility];  Service: General;  Laterality: N/A;  . TUMOR EXCISION     intestinal after SBO ( Benign), appendectomy  . VENTRAL HERNIA REPAIR  02/24/2012   Procedure: LAPAROSCOPIC VENTRAL HERNIA;  Surgeon: Adin Hector, MD;  [facility];  Service: General;  Laterality: N/A;  lysis of adhesions, excision of abdominal wall lipomae     A IV Location/Drains/Wounds Patient Lines/Drains/Airways Status   Active Line/Drains/Airways    Name:   Placement date:   Placement time:   Site:   Days:   Peripheral IV 02/11/19 Left;Posterior Hand   02/11/19    -    Hand   less than 1   Peripheral IV 02/11/19 Right Antecubital   02/11/19    0901    Antecubital   less than 1          Intake/Output Last 24 hours  Intake/Output Summary (Last 24 hours) at 02/11/2019 1144 Last data filed at 02/11/2019 1143 Gross per 24 hour  Intake 400.09 ml  Output -  Net 400.09 ml    Labs/Imaging Results for orders placed or performed during the hospital encounter of 02/11/19 (from the past 48 hour(s))  CBC with Differential/Platelet     Status: Abnormal   Collection Time: 02/11/19  8:59 AM  Result Value Ref Range   WBC 4.0 4.0 - 10.5 K/uL   RBC 4.16 (L) 4.22 - 5.81 MIL/uL   Hemoglobin 12.6 (L) 13.0 - 17.0 g/dL   HCT 41.0 39.0 - 52.0 %   MCV 98.6 80.0 - 100.0 fL   MCH 30.3 26.0 - 34.0 pg   MCHC 30.7 30.0 - 36.0 g/dL   RDW 14.0 11.5 - 15.5 %   Platelets 192 150 - 400 K/uL   nRBC 0.0 0.0 - 0.2 %   Neutrophils Relative  % 64 %   Neutro Abs 2.6 1.7 - 7.7 K/uL   Lymphocytes Relative 23 %   Lymphs Abs 0.9 0.7 - 4.0 K/uL   Monocytes Relative 7 %   Monocytes Absolute 0.3 0.1 - 1.0 K/uL   Eosinophils Relative 1 %   Eosinophils Absolute 0.0 0.0 - 0.5 K/uL   Basophils Relative 0 %   Basophils Absolute 0.0 0.0 - 0.1 K/uL   Immature Granulocytes 5 %   Abs Immature Granulocytes 0.19 (H) 0.00 - 0.07 K/uL    Comment: Performed at [facility], [place] [address], [place], [place] [address]  Comprehensive metabolic panel     Status: Abnormal   Collection Time: 02/11/19  8:59 AM  Result Value Ref Range   Sodium 142 135 - 145 mmol/L   Potassium 4.4 3.5 - 5.1 mmol/L   Chloride 105 98 - 111 mmol/L   CO2 27 22 - 32 mmol/L   Glucose, Bld 206 (H) 70 - 99 mg/dL   BUN 27 (H) 8 - 23 mg/dL   Creatinine, Ser 0.93 0.61 - 1.24 mg/dL   Calcium 8.7 (L) 8.9 - 10.3 mg/dL   Total Protein 7.2 6.5 - 8.1 g/dL   Albumin 3.9 3.5 - 5.0 g/dL   AST 25 15 - 41 U/L   ALT 27 0 - 44 U/L   Alkaline Phosphatase 69 38 - 126 U/L   Total Bilirubin 0.4 0.3 - 1.2 mg/dL   GFR calc non Af Amer >60 >60 mL/min   GFR calc Af Amer >60 >60 mL/min   Anion gap 10 5 - 15    Comment: Performed at [facility], [place] [address] [place], [place] [address]  SARS Coronavirus 2 (CEPHEID- Performed in [place] hospital lab), Hosp Order     Status: None   Collection Time: 02/11/19  8:59 AM   Specimen: Nasopharyngeal Swab  Result Value Ref Range   SARS Coronavirus 2 NEGATIVE NEGATIVE    Comment: (NOTE) If result is NEGATIVE SARS-CoV-2 target nucleic acids are NOT DETECTED. The SARS-CoV-2 RNA is generally detectable in upper and lower  respiratory specimens during the acute phase of infection. The lowest  concentration of SARS-CoV-2 viral copies this assay can detect is 250  copies / mL. A negative result does not preclude SARS-CoV-2 infection  and should not be used as the sole basis for treatment or other  patient  management decisions.  A negative result may occur with  improper specimen collection / handling, submission of specimen other  than nasopharyngeal swab, presence of viral mutation(s) within the  areas targeted by this assay, and inadequate number of viral copies  (<250 copies / mL). A negative  result must be combined with clinical  observations, patient history, and epidemiological information. If result is POSITIVE SARS-CoV-2 target nucleic acids are DETECTED. The SARS-CoV-2 RNA is generally detectable in upper and lower  respiratory specimens dur ing the acute phase of infection.  Positive  results are indicative of active infection with SARS-CoV-2.  Clinical  correlation with patient history and other diagnostic information is  necessary to determine patient infection status.  Positive results do  not rule out bacterial infection or co-infection with other viruses. If result is PRESUMPTIVE POSTIVE SARS-CoV-2 nucleic acids MAY BE PRESENT.   A presumptive positive result was obtained on the submitted specimen  and confirmed on repeat testing.  While 2019 novel coronavirus  (SARS-CoV-2) nucleic acids may be present in the submitted sample  additional confirmatory testing may be necessary for epidemiological  and / or clinical management purposes  to differentiate between  SARS-CoV-2 and other Sarbecovirus currently known to infect humans.  If clinically indicated additional testing with an alternate test  methodology [phone]) is advised. The SARS-CoV-2 RNA is generally  detectable in upper and lower respiratory sp ecimens during the acute  phase of infection. The expected result is Negative. Fact Sheet for Patients:  StrictlyIdeas.no Fact Sheet for Healthcare Providers: BankingDealers.co.za This test is not yet approved or cleared by the [place] FDA and has been authorized for detection and/or diagnosis of SARS-CoV-2 by FDA under  an Emergency Use Authorization (EUA).  This EUA will remain in effect (meaning this test can be used) for the duration of the COVID-19 declaration under Section 564(b)(1) of the Act, 21 U.S.C. section 360bbb-3(b)(1), unless the authorization is terminated or revoked sooner. Performed at [facility], [place] [address], [place], [place] [address]   Lactic acid, plasma     Status: None   Collection Time: 02/11/19  8:59 AM  Result Value Ref Range   Lactic Acid, Venous 0.9 0.5 - 1.9 mmol/L    Comment: Performed at [facility], [place] [address], [place], [place] [address]   Dg Chest Portable 1 View  Result Date: 02/11/2019 CLINICAL DATA:  78 year old with shortness of breath.  Hypoxia. EXAM: PORTABLE CHEST 1 VIEW COMPARISON:  02/08/2027 and on 12/24/2018.  Chest CT 12/25/2018 FINDINGS: Again noted is hyperinflation. Few densities at the left lung base are suggestive for atelectasis. Right costophrenic angle is incompletely imaged on this examination. Heart size is upper limits of normal but stable. Atherosclerotic calcifications at the aortic arch. No focal airspace disease or pulmonary edema. Patchy densities at the right lung base appear chronic. IMPRESSION: 1. Patchy densities at the lung bases are suggestive for atelectasis and/or scarring. 2. Hyperinflation and emphysema. Electronically Signed   By: Markus Daft M.D.   On: 02/11/2019 09:06    Pending Labs Unresulted Labs (From admission, onward)    Start     Ordered   02/11/19 0841  Blood culture (routine x 2)  BLOOD CULTURE X 2,   STAT     02/11/19 0840          Vitals/Pain Today's Vitals   02/11/19 1000 02/11/19 1030 02/11/19 1100 02/11/19 1132  BP: (!) 131/58 136/60 138/61 (!) 150/64  Pulse: 95 92 88 89  Resp: (!) 23 (!) 24 (!) 23 19  Temp:      TempSrc:      SpO2: 96% 95% 97% 97%  Weight:      PainSc:        Isolation Precautions Airborne and Contact  precautions  Medications Medications   vancomycin (VANCOCIN) 2,000 mg in sodium chloride 0.9 % 500 mL IVPB ( Intravenous Rate/Dose Verify 02/11/19 1143)  methylPREDNISolone sodium succinate (SOLU-MEDROL) 125 mg/2 mL injection 60 mg (60 mg Intravenous Given 02/11/19 1141)  albuterol (VENTOLIN HFA) 108 (90 Base) MCG/ACT inhaler 8 puff (8 puffs Inhalation Given 02/11/19 0910)  ipratropium (ATROVENT HFA) inhaler 2 puff (2 puffs Inhalation Given 02/11/19 0908)  aerochamber Z-Stat Plus/medium 1 each (1 each Other Given 02/11/19 0908)  ceFEPIme (MAXIPIME) 2 g in sodium chloride 0.9 % 100 mL IVPB ( Intravenous Stopped 02/11/19 1018)    Mobility  Have not ambulated pt, is independent at home Low fall risk   Focused Assessments     R Recommendations: See Admitting Provider Note  Report given to:   Additional Notes:

## 2019-02-11 NOTE — Progress Notes (Signed)
Pt states that he did not qualify for CPAP when he had his sleep study.  Pt does not want to use CPAP QHS, RT to monitor and assess as needed.

## 2019-02-11 NOTE — ED Triage Notes (Signed)
Pt BIBA from home, c/o [facility]. Pt recently d/c from [facility] with PNA. Upon fire arrival, pt was satting at 50% 2L [place]. Pt was 97% on NRB with EMS.  Pt received epi IM en route, along with 125 solumedrol and 2g of mag.

## 2019-02-11 NOTE — Evaluation (Signed)
Clinical/Bedside Swallow Evaluation Patient Details  Name: Cory Kemp MRN: [id number] Date of Birth: [birth year]  Today's Date: 02/11/2019 Time: SLP Start Time (ACUTE ONLY): 1435 SLP Stop Time (ACUTE ONLY): 1450 SLP Time Calculation (min) (ACUTE ONLY): 15 min  Past Medical History:  Past Medical History:  Diagnosis Date  . Alcohol abuse   . Anemia   . Arthritis   . Asthma   . B12 deficiency 11/03/2013  . BPH (benign prostatic hypertrophy)   . COPD (chronic obstructive pulmonary disease) ([place])   . Dementia ([place])    ?  [name] Kitchen Diabetes mellitus ([place]) 01/10/2012  . Femoral hernia, right 03/27/2012  . Glucose intolerance (impaired glucose tolerance)   . Hyperlipidemia   . Hypertension   . Incisional hernias - swiss-cheese-type periumbilical 0/0/9233  . Inguinal hernia - right 01/10/2012  . Leukopenia 01/29/2013  . Monocytosis 01/29/2013  . Normochromic anemia 01/29/2013  . Osteopenia    Past Surgical History:  Past Surgical History:  Procedure Laterality Date  . HEMORRHOID SURGERY    . HERNIA REPAIR    . INGUINAL HERNIA REPAIR  02/24/2012   Procedure: LAPAROSCOPIC INGUINAL HERNIA;  Surgeon: Adin Hector, MD;  Location: [facility] ORS;  Service: General;  Laterality: N/A;  . TUMOR EXCISION     intestinal after SBO ( Benign), appendectomy  . VENTRAL HERNIA REPAIR  02/24/2012   Procedure: LAPAROSCOPIC VENTRAL HERNIA;  Surgeon: Adin Hector, MD;  Location: [facility] ORS;  Service: General;  Laterality: N/A;  lysis of adhesions, excision of abdominal wall lipomae   HPI:  Patient is a 78 y.o. male with PMH: alcohol abuse, B12 deficiency, COPD, CPH, DM-2, HDL, HTN, leukopenia and questionable dementia, who presneted with worsening SOB. He had been recently discharged from the hospital for acute on chronic COPD exacerbation and was discharged on 7/4. When EMS arrived, his O2 saturation was at 50% on 3L. Patient reports coughing up clear sputnum which has not worsened from baseline.   Assessment / Plan /  Recommendation Clinical Impression  Patient presents with an oropharyngeal swallow that is WNL and is without any overt s/s of aspiration or penetration. Patient's only complaint is that he has thick but clear mucous he is coughing up throughout the day, but in the morning, it is at its worse and he will often have difficulty breathing, but with resolution of symptoms. He said it has gotten to the point where he has fear of knowing he will wake up with breathing issues. He also stated that [place]. SLP Visit Diagnosis: Dysphagia, unspecified (R13.10)    Aspiration Risk  No limitations    Diet Recommendation Thin liquid;Regular   Liquid Administration via: Cup;Straw Medication Administration: Whole meds with liquid Supervision: Patient able to self feed Postural Changes: Seated upright at 90 degrees    Other  Recommendations     Follow up Recommendations None      Frequency and Duration   N/A         Prognosis  N/A     Swallow Study   General Date of Onset: 02/11/19 HPI: Patient is a 78 y.o. male with PMH: alcohol abuse, B12 deficiency, COPD, CPH, DM-2, HDL, HTN, leukopenia and questionable dementia, who presneted with worsening SOB. He had been recently discharged from the hospital for acute on chronic COPD exacerbation and was discharged on 7/4. When EMS arrived, his O2 saturation was at 50% on 3L. Patient reports coughing up clear sputnum  which has not worsened from baseline.    Oral/Motor/Sensory Function Overall Oral Motor/Sensory Function: Within functional limits   Ice Chips     Thin Liquid Thin Liquid: Within functional limits Presentation: Self Fed;Straw    Nectar Thick Nectar Thick Liquid: Not tested   Honey Thick Honey Thick Liquid: Not tested   Puree Puree: Not tested   Solid     Solid: Not tested      Dannial Monarch 02/11/2019,5:32 PM   Sonia Baller, MA, CCC-SLP Speech Therapy [facility]  Acute Rehab Pager: [phone]

## 2019-02-11 NOTE — ED Provider Notes (Signed)
[facility] DEPT Provider Note   CSN: [id number] Arrival date & time: 02/11/19  0923     History   Chief Complaint Chief Complaint  Patient presents with  . Shortness of Breath    HPI Cory Kemp is a 78 y.o. male.     HPI Patient is a 78 year old male who presents to the emergency department via ambulance and nonrebreather mask for severe shortness of breath which began today.  He is found to be hypoxic to 50% on 2 L nasal cannula.  He was brought to the emergency department on nonrebreather mask by EMS.  He was given epinephrine in route as well as 2 g of magnesium and 125 mg of Solu-Medrol.  He has not received any bronchodilators to this point.  He is on 3 L nasal cannula at baseline.  Report of atrial fibrillation for EMS with rapid ventricular response.  Recently hospitalized for COPD exacerbation and discharged from the hospital 2 days ago on prednisone without antibiotics.  He was negative for COVID-19 during his hospitalization with negative test on 02/08/2019.  Denies active chest pain at this time.  No fevers.  No nausea vomiting or diarrhea.   Past Medical History:  Diagnosis Date  . Alcohol abuse   . Anemia   . Arthritis   . Asthma   . B12 deficiency 11/03/2013  . BPH (benign prostatic hypertrophy)   . COPD (chronic obstructive pulmonary disease) ([place])   . Dementia ([place])    ?  [name] Kitchen Diabetes mellitus ([place]) 01/10/2012  . Femoral hernia, right 03/27/2012  . Glucose intolerance (impaired glucose tolerance)   . Hyperlipidemia   . Hypertension   . Incisional hernias - swiss-cheese-type periumbilical 3/0/0762  . Inguinal hernia - right 01/10/2012  . Leukopenia 01/29/2013  . Monocytosis 01/29/2013  . Normochromic anemia 01/29/2013  . Osteopenia     Patient Active Problem List   Diagnosis Date Noted  . COPD with acute exacerbation ([place]) 02/08/2019  . Hypertensive urgency 02/08/2019  . CAP (community acquired pneumonia) 12/25/2018  . Acute on  chronic respiratory failure with hypoxia ([place]) 12/25/2018  . Elevated d-dimer 12/25/2018  . Chronic respiratory failure with hypoxia ([place]) 12/13/2018  . Abnormal finding on EKG 01/03/2018  . OSA (obstructive sleep apnea) 06/14/2017  . Sinus tachycardia 04/11/2016  . Essential hypertension 04/11/2016  . COPD (chronic obstructive pulmonary disease) ([place]) 01/11/2016  . B12 deficiency 11/03/2013  . Leukopenia 01/29/2013  . Monocytosis 01/29/2013  . Normochromic anemia 01/29/2013  . Tobacco abuse 03/27/2012  . Diabetes mellitus ([place]) 01/10/2012    Past Surgical History:  Procedure Laterality Date  . HEMORRHOID SURGERY    . HERNIA REPAIR    . INGUINAL HERNIA REPAIR  02/24/2012   Procedure: LAPAROSCOPIC INGUINAL HERNIA;  Surgeon: Adin Hector, MD;  Location: [facility]S;  Service: General;  Laterality: N/A;  . TUMOR EXCISION     intestinal after SBO ( Benign), appendectomy  . VENTRAL HERNIA REPAIR  02/24/2012   Procedure: LAPAROSCOPIC VENTRAL HERNIA;  Surgeon: Adin Hector, MD;  Location: [facility]S;  Service: General;  Laterality: N/A;  lysis of adhesions, excision of abdominal wall lipomae        Home Medications    Prior to Admission medications   Medication Sig Start Date End Date Taking? Authorizing Provider  albuterol (PROVENTIL) (2.5 MG/3ML) 0.083% nebulizer solution Take 3 mLs (2.5 mg total) by nebulization every 6 (six) hours as needed for wheezing or shortness of breath. 01/03/18  Lauraine Rinne, NP  albuterol (VENTOLIN HFA) 108 (90 Base) MCG/ACT inhaler Inhale 2 puffs into the lungs every 4 (four) hours as needed for wheezing or shortness of breath. 12/13/18   Lauraine Rinne, NP  Cholecalciferol (VITAMIN D-3) 25 MCG (1000 UT) CAPS Take 1,000 Units by mouth at bedtime.    [provider]  cyclobenzaprine (FLEXERIL) 10 MG tablet Take 1 tablet (10 mg total) by mouth 2 (two) times daily as needed for muscle spasms. 12/12/18   Maudie Flakes, MD  diclofenac sodium (VOLTAREN) 1 %  GEL Apply 4 g topically 4 (four) times daily. Patient taking differently: Apply 4 g topically 4 (four) times daily as needed (for pain- to affected sites).  08/28/18   Deno Etienne, DO  escitalopram (LEXAPRO) 5 MG tablet Take 5 mg by mouth daily. 12/11/18   [provider]  Fluticasone-Umeclidin-Vilant (TRELEGY ELLIPTA) 100-62.5-25 MCG/INH AEPB Inhale 1 puff into the lungs daily. 02/02/18   Mannam, Hart Robinsons, MD  ibuprofen (ADVIL) 200 MG tablet Take 400 mg by mouth at bedtime.    [provider]  Ipratropium-Albuterol (COMBIVENT RESPIMAT) 20-100 MCG/ACT AERS respimat Inhale 1 puff into the lungs every 6 (six) hours.    [provider]  losartan (COZAAR) 50 MG tablet Take 50 mg by mouth at bedtime.  12/26/11   [provider]  OXYGEN Inhale 4 L into the lungs continuous.    [provider]  oxymetazoline (AFRIN NODRIP SEVERE CONGEST) 0.05 % nasal spray Place 1 spray into both nostrils every 3 (three) hours.    [provider]  predniSONE (DELTASONE) 20 MG tablet Take 1 tablet (20 mg total) by mouth daily with breakfast for 4 days. 02/10/19 02/14/19  Arrien, Jimmy Picket, MD  simvastatin (ZOCOR) 40 MG tablet Take 40 mg by mouth at bedtime.  12/27/11   [provider]  vitamin B-12 (CYANOCOBALAMIN) 100 MCG tablet Take 100 mcg by mouth at bedtime.     [provider]    Family History Family History  Problem Relation Age of Onset  . Lung cancer Father   . Lung cancer Mother   . Diabetes Brother     Social History Social History   Tobacco Use  . Smoking status: Former Smoker    Packs/day: 1.00    Years: 63.00    Pack years: 63.00    Types: Cigarettes    Start date: 08/09/1955    Quit date: 08/08/2018    Years since quitting: 0.5  . Smokeless tobacco: Never Used  . Tobacco comment: reports he stopped at the beginning of this year  Substance Use Topics  . Alcohol use: No    Alcohol/week: 0.0 standard drinks  . Drug use: No      Allergies   Adhesive [tape]   Review of Systems Review of Systems  All other systems reviewed and are negative.    Physical Exam Updated Vital Signs BP 140/63   Pulse (!) 121   Temp 98.6 F (37 C) (Rectal)   Resp (!) 24   Wt 95.3 kg   SpO2 96%   BMI 27.72 kg/m   Physical Exam Vitals signs and nursing note reviewed.  Constitutional:      Appearance: He is well-developed.  HENT:     Head: Normocephalic and atraumatic.  Neck:     Musculoskeletal: Normal range of motion.  Cardiovascular:     Rate and Rhythm: Regular rhythm. Tachycardia present.     Heart sounds: Normal heart sounds.  Pulmonary:     Effort: Tachypnea and respiratory distress present.     Breath sounds: Normal breath sounds.  Abdominal:     General: There is no distension.     Palpations: Abdomen is soft.     Tenderness: There is no abdominal tenderness.  Musculoskeletal: Normal range of motion.  Skin:    General: Skin is warm and dry.  Neurological:     Mental Status: He is alert and oriented to person, place, and time.  Psychiatric:        Judgment: Judgment normal.      ED Treatments / Results  Labs (all labs ordered are listed, but only abnormal results are displayed) Labs Reviewed  CBC WITH DIFFERENTIAL/PLATELET - Abnormal; Notable for the following components:      Result Value   RBC 4.16 (*)    Hemoglobin 12.6 (*)    Abs Immature Granulocytes 0.19 (*)    All other components within normal limits  COMPREHENSIVE METABOLIC PANEL - Abnormal; Notable for the following components:   Glucose, Bld 206 (*)    BUN 27 (*)    Calcium 8.7 (*)    All other components within normal limits  SARS CORONAVIRUS 2 (HOSPITAL ORDER, [facility])  CULTURE, BLOOD (ROUTINE X 2)  CULTURE, BLOOD (ROUTINE X 2)  LACTIC ACID, PLASMA    EKG ECG interpretation   Date: 02/11/2019  Rate: 129  Rhythm: sinus tachycardia  QRS Axis: normal  Intervals: normal  ST/T Wave  abnormalities: normal  Conduction Disutrbances: none  Narrative Interpretation:   Old EKG Reviewed: No significant changes noted except for rate     Radiology Dg Chest Portable 1 View  Result Date: 02/11/2019 CLINICAL DATA:  78 year old with shortness of breath.  Hypoxia. EXAM: PORTABLE CHEST 1 VIEW COMPARISON:  02/08/2027 and on 12/24/2018.  Chest CT 12/25/2018 FINDINGS: Again noted is hyperinflation. Few densities at the left lung base are suggestive for atelectasis. Right costophrenic angle is incompletely imaged on this examination. Heart size is upper limits of normal but stable. Atherosclerotic calcifications at the aortic arch. No focal airspace disease or pulmonary edema. Patchy densities at the right lung base appear chronic. IMPRESSION: 1. Patchy densities at the lung bases are suggestive for atelectasis and/or scarring. 2. Hyperinflation and emphysema. Electronically Signed   By: Markus Daft M.D.   On: 02/11/2019 09:06    Procedures .Critical Care Performed by: Jola Schmidt, MD Authorized by: Jola Schmidt, MD   Critical care provider statement:    Critical care time (minutes):  35   Critical care time was exclusive of:  Separately billable procedures and treating other patients and teaching time   Critical care was necessary to treat or prevent imminent or life-threatening deterioration of the following conditions:  Respiratory failure   Critical care was time spent personally by me on the following activities:  Discussions with consultants, evaluation of patient's response to treatment, examination of patient, ordering and performing treatments and interventions, ordering and review of laboratory studies, ordering and review of radiographic studies, pulse oximetry, re-evaluation of patient's condition, obtaining history from patient or surrogate and review of old charts   (including critical care time)  Medications Ordered in ED Medications  ceFEPIme (MAXIPIME) 2 g in sodium  chloride 0.9 % 100 mL IVPB (has no administration in time range)  vancomycin (VANCOCIN) 2,000 mg in sodium chloride 0.9 % 500 mL IVPB (has no administration in time range)  albuterol (VENTOLIN HFA) 108 (90 Base) MCG/ACT  inhaler 8 puff (8 puffs Inhalation Given 02/11/19 0910)  ipratropium (ATROVENT HFA) inhaler 2 puff (2 puffs Inhalation Given 02/11/19 0908)  aerochamber Z-Stat Plus/medium 1 each (1 each Other Given 02/11/19 0908)     Initial Impression / Assessment and Plan / ED Course  I have reviewed the triage vital signs and the nursing notes.  Pertinent labs & imaging results that were available during my care of the patient were reviewed by me and considered in my medical decision making (see chart for details).       Respiratory rate and effort improving here with bronchodilators, Atrovent, supplemental oxygen.  Labs and chest x-ray pending at this time.  May represent COPD exacerbation.  EKG pending at this time to determine sinus tachycardia versus atrial fibrillation with rapid ventricular response.  On the monitor it looks somewhat irregular.  We will continue to monitor the patient closely while in the emergency department  11:03 AM Patient improved with bronchodilators here in the emergency department.  Patient now on 4 L nasal cannula satting 94%.  Patient will be admitted to hospital for ongoing COPD exacerbation.  I am concerned about the possibility of a developing right infiltrate and think antibiotics at this time are appropriate   Final Clinical Impressions(s) / ED Diagnoses   Final diagnoses:  Acute and chronic respiratory failure with hypoxia [facility])  COPD exacerbation [facility])    ED Discharge Orders    None       Jola Schmidt, MD 02/11/19 1104

## 2019-02-11 NOTE — ED Notes (Signed)
Pt placed on 4L [place] after receiving meds and breathing treatments

## 2019-02-11 NOTE — H&P (Signed)
History and Physical    Cory Kemp UUV:[id number] DOB: 1941-03-16 DOA: 02/11/2019  PCP: Jani Gravel, MD   Patient coming from: Home  Chief Complaint: Worsening SOB this AM   HPI: Cory Kemp is a 78 y.o. male with medical history significant for but not limited too history of alcohol abuse, anemia, B12 deficiency, BPH, COPD, diabetes mellitus type 2, hyperlipidemia, hypertension, history of leukopenia and questionable dementia along with other comorbidities who presents with a chief complaint of worsening shortness of breath this morning.  Patient was recently discharged from the hospital for acute on chronic COPD exacerbation discharged on 02/09/2019.  He went home he started feeling okay but this morning he significantly worsened and it was difficult for him to catch his breath.  He tried to rest and is still short of breath and EMS was called.  When EMS arrived patient was saturating 50% on 3 L and so they placed him on a nonrebreather and gave him IV Solu-Medrol, and IM epinephrine injection, as well as IV magnesium sulfate.  Patient denies chest pain, lightheadedness or dizziness but does feel weak and states that his shortness breath was fairly significant today.  States that he is coughing up some clear sputum which has not worsened from baseline.  No other concerns or complaints at this time.  TRH was called to admit this patient for an acute on chronic respiratory failure with hypoxia as well as acute exacerbation of COPD  ED Course: In the ED patient had basic blood work done as well as a chest x-ray. CTA of the chest was also done and still pending.  SARS-CoV-2 testing was negative.  Review of Systems: As per HPI otherwise all other systems reviewed and negative.   Past Medical History:  Diagnosis Date   Alcohol abuse    Anemia    Arthritis    Asthma    B12 deficiency 11/03/2013   BPH (benign prostatic hypertrophy)    COPD (chronic obstructive pulmonary disease) (HCC)     Dementia (HCC)    ?   Diabetes mellitus ([place]) 01/10/2012   Femoral hernia, right 03/27/2012   Glucose intolerance (impaired glucose tolerance)    Hyperlipidemia    Hypertension    Incisional hernias - swiss-cheese-type periumbilical 11/12/4257   Inguinal hernia - right 01/10/2012   Leukopenia 01/29/2013   Monocytosis 01/29/2013   Normochromic anemia 01/29/2013   Osteopenia    Past Surgical History:  Procedure Laterality Date   HEMORRHOID SURGERY     HERNIA REPAIR     INGUINAL HERNIA REPAIR  02/24/2012   Procedure: LAPAROSCOPIC INGUINAL HERNIA;  Surgeon: Adin Hector, MD;  Location: [facility];  Service: General;  Laterality: N/A;   TUMOR EXCISION     intestinal after SBO ( Benign), appendectomy   VENTRAL HERNIA REPAIR  02/24/2012   Procedure: LAPAROSCOPIC VENTRAL HERNIA;  Surgeon: Adin Hector, MD;  Location: [facility];  Service: General;  Laterality: N/A;  lysis of adhesions, excision of abdominal wall lipomae   SOCIAL HISTORY   reports that he quit smoking about 6 months ago. His smoking use included cigarettes. He started smoking about 63 years ago. He has a 63.00 pack-year smoking history. He has never used smokeless tobacco. He reports that he does not drink alcohol or use drugs.  Allergies  Allergen Reactions   Adhesive [Tape] Rash    No "PLASTIC" tape!!! Patient broke out in blisters!!   Family History  Problem Relation Age of Onset   Lung  cancer Father    Lung cancer Mother    Diabetes Brother    Prior to Admission medications   Medication Sig Start Date End Date Taking? Authorizing Provider  albuterol (PROVENTIL) (2.5 MG/3ML) 0.083% nebulizer solution Take 3 mLs (2.5 mg total) by nebulization every 6 (six) hours as needed for wheezing or shortness of breath. 01/03/18   Lauraine Rinne, NP  albuterol (VENTOLIN HFA) 108 (90 Base) MCG/ACT inhaler Inhale 2 puffs into the lungs every 4 (four) hours as needed for wheezing or shortness of breath. 12/13/18   Lauraine Rinne, NP  Cholecalciferol (VITAMIN D-3) 25 MCG (1000 UT) CAPS Take 1,000 Units by mouth at bedtime.    [provider]  cyclobenzaprine (FLEXERIL) 10 MG tablet Take 1 tablet (10 mg total) by mouth 2 (two) times daily as needed for muscle spasms. 12/12/18   Maudie Flakes, MD  diclofenac sodium (VOLTAREN) 1 % GEL Apply 4 g topically 4 (four) times daily. Patient taking differently: Apply 4 g topically 4 (four) times daily as needed (for pain- to affected sites).  08/28/18   Deno Etienne, DO  escitalopram (LEXAPRO) 5 MG tablet Take 5 mg by mouth daily. 12/11/18   [provider]  Fluticasone-Umeclidin-Vilant (TRELEGY ELLIPTA) 100-62.5-25 MCG/INH AEPB Inhale 1 puff into the lungs daily. 02/02/18   Mannam, Hart Robinsons, MD  ibuprofen (ADVIL) 200 MG tablet Take 400 mg by mouth at bedtime.    [provider]  Ipratropium-Albuterol (COMBIVENT RESPIMAT) 20-100 MCG/ACT AERS respimat Inhale 1 puff into the lungs every 6 (six) hours.    [provider]  losartan (COZAAR) 50 MG tablet Take 50 mg by mouth at bedtime.  12/26/11   [provider]  OXYGEN Inhale 4 L into the lungs continuous.    [provider]  oxymetazoline (AFRIN NODRIP SEVERE CONGEST) 0.05 % nasal spray Place 1 spray into both nostrils every 3 (three) hours.    [provider]  predniSONE (DELTASONE) 20 MG tablet Take 1 tablet (20 mg total) by mouth daily with breakfast for 4 days. 02/10/19 02/14/19  Arrien, Jimmy Picket, MD  simvastatin (ZOCOR) 40 MG tablet Take 40 mg by mouth at bedtime.  12/27/11   [provider]  vitamin B-12 (CYANOCOBALAMIN) 100 MCG tablet Take 100 mcg by mouth at bedtime.     [provider]   Physical Exam: Vitals:   02/11/19 1132 02/11/19 1214 02/11/19 1215 02/11/19 1230  BP: (!) 150/64 (!) 166/75  (!) 171/80  Pulse: 89 (!) 103 100 99  Resp: 19 18 16  (!) 25  Temp:      TempSrc:      SpO2: 97% 97% 96% 97%  Weight:       Constitutional: WN/WD  elderly Caucasian male currently in mild respiratory distress and appears calm but slightly uncomfortable Eyes: Lids and conjunctivae normal, sclerae anicteric  ENMT: External Ears, Nose appear normal. Grossly normal hearing. Mucous membranes are moist. Neck: Appears normal, supple, no cervical masses, normal ROM, no appreciable thyromegaly; no JVD Respiratory: Diminished to auscultation bilaterally, no wheezing, rales, rhonchi or crackles. Normal respiratory effort and patient is not tachypenic. No accessory muscle use.  Cardiovascular: Tachycardic rate but regular rhythm. no murmurs / rubs / gallops. S1 and S2 auscultated. Trace extremity edema.  Abdomen: Soft, non-tender, Distended. No masses palpated. No appreciable hepatosplenomegaly. Bowel sounds positive x4.  GU: Deferred. Musculoskeletal: No clubbing / cyanosis of digits/nails. No joint deformity upper and lower extremities.  Skin: No rashes, lesions, ulcers  on a limited skin evaluation but has lower extremity changes;. No induration; Warm and dry.  Neurologic: CN 2-12 grossly intact with no focal deficit. Romberg sign and cerebellar reflexes not assessed.  Psychiatric: Normal judgment and insight. Alert and oriented x 3. Normal mood and appropriate affect.   Labs on Admission: I have personally reviewed following labs and imaging studies  CBC: Recent Labs  Lab 02/08/19 1737 02/08/19 1819 02/09/19 0443 02/11/19 0859  WBC 5.0  --  1.3* 4.0  NEUTROABS 1.4*  --   --  2.6  HGB 13.3 13.9 11.7* 12.6*  HCT 42.2 41.0 36.1* 41.0  MCV 95.7  --  92.8 98.6  PLT 212  --  179 798   Basic Metabolic Panel: Recent Labs  Lab 02/08/19 1737 02/08/19 1819 02/09/19 0443 02/11/19 0859  NA 141 142 138 142  K 4.3 4.2 4.3 4.4  CL 105  --  105 105  CO2 26  --  26 27  GLUCOSE 127*  --  232* 206*  BUN 15  --  18 27*  CREATININE 0.96  --  0.87 0.93  CALCIUM 9.0  --  8.7* 8.7*   GFR: Estimated Creatinine Clearance: 75.2 mL/min (by C-G formula  based on SCr of 0.93 mg/dL). Liver Function Tests: Recent Labs  Lab 02/08/19 1737 02/11/19 0859  AST 22 25  ALT 18 27  ALKPHOS 82 69  BILITOT 0.6 0.4  PROT 7.7 7.2  ALBUMIN 3.9 3.9   No results for input(s): LIPASE, AMYLASE in the last 168 hours. No results for input(s): AMMONIA in the last 168 hours. Coagulation Profile: No results for input(s): INR, PROTIME in the last 168 hours. Cardiac Enzymes: No results for input(s): CKTOTAL, CKMB, CKMBINDEX, TROPONINI in the last 168 hours. BNP (last 3 results) No results for input(s): PROBNP in the last 8760 hours. HbA1C: No results for input(s): HGBA1C in the last 72 hours. CBG: Recent Labs  Lab 02/09/19 0657 02/09/19 1147  GLUCAP 151* 133*   Lipid Profile: No results for input(s): CHOL, HDL, LDLCALC, TRIG, CHOLHDL, LDLDIRECT in the last 72 hours. Thyroid Function Tests: No results for input(s): TSH, T4TOTAL, FREET4, T3FREE, THYROIDAB in the last 72 hours. Anemia Panel: No results for input(s): VITAMINB12, FOLATE, FERRITIN, TIBC, IRON, RETICCTPCT in the last 72 hours. Urine analysis:    Component Value Date/Time   COLORURINE YELLOW 07/22/2013 0725   APPEARANCEUR CLEAR 07/22/2013 0725   LABSPEC 1.011 07/22/2013 0725   PHURINE 6.5 07/22/2013 0725   GLUCOSEU NEGATIVE 07/22/2013 0725   HGBUR TRACE (A) 07/22/2013 0725   BILIRUBINUR NEGATIVE 07/22/2013 0725   KETONESUR NEGATIVE 07/22/2013 0725   PROTEINUR NEGATIVE 07/22/2013 0725   UROBILINOGEN 0.2 07/22/2013 0725   NITRITE NEGATIVE 07/22/2013 0725   LEUKOCYTESUR NEGATIVE 07/22/2013 0725   Sepsis Labs: !!!!!!!!!!!!!!!!!!!!!!!!!!!!!!!!!!!!!!!!!!!! @LABRCNTIP (procalcitonin:4,lacticidven:4) ) Recent Results (from the past 240 hour(s))  SARS Coronavirus 2 (CEPHEID - Performed in [place] hospital lab), Hosp Order     Status: None   Collection Time: 02/08/19  5:35 PM   Specimen: Nasopharyngeal Swab  Result Value Ref Range Status   SARS Coronavirus 2 NEGATIVE NEGATIVE Final     Comment: (NOTE) If result is NEGATIVE SARS-CoV-2 target nucleic acids are NOT DETECTED. The SARS-CoV-2 RNA is generally detectable in upper and lower  respiratory specimens during the acute phase of infection. The lowest  concentration of SARS-CoV-2 viral copies this assay can detect is 250  copies / mL. A negative result does not preclude SARS-CoV-2 infection  and  should not be used as the sole basis for treatment or other  patient management decisions.  A negative result may occur with  improper specimen collection / handling, submission of specimen other  than nasopharyngeal swab, presence of viral mutation(s) within the  areas targeted by this assay, and inadequate number of viral copies  (<250 copies / mL). A negative result must be combined with clinical  observations, patient history, and epidemiological information. If result is POSITIVE SARS-CoV-2 target nucleic acids are DETECTED. The SARS-CoV-2 RNA is generally detectable in upper and lower  respiratory specimens dur ing the acute phase of infection.  Positive  results are indicative of active infection with SARS-CoV-2.  Clinical  correlation with patient history and other diagnostic information is  necessary to determine patient infection status.  Positive results do  not rule out bacterial infection or co-infection with other viruses. If result is PRESUMPTIVE POSTIVE SARS-CoV-2 nucleic acids MAY BE PRESENT.   A presumptive positive result was obtained on the submitted specimen  and confirmed on repeat testing.  While 2019 novel coronavirus  (SARS-CoV-2) nucleic acids may be present in the submitted sample  additional confirmatory testing may be necessary for epidemiological  and / or clinical management purposes  to differentiate between  SARS-CoV-2 and other Sarbecovirus currently known to infect humans.  If clinically indicated additional testing with an alternate test  methodology [phone]) is advised. The  SARS-CoV-2 RNA is generally  detectable in upper and lower respiratory sp ecimens during the acute  phase of infection. The expected result is Negative. Fact Sheet for Patients:  StrictlyIdeas.no Fact Sheet for Healthcare Providers: BankingDealers.co.za This test is not yet approved or cleared by the [place] FDA and has been authorized for detection and/or diagnosis of SARS-CoV-2 by FDA under an Emergency Use Authorization (EUA).  This EUA will remain in effect (meaning this test can be used) for the duration of the COVID-19 declaration under Section 564(b)(1) of the Act, 21 U.S.C. section 360bbb-3(b)(1), unless the authorization is terminated or revoked sooner. Performed at [facility], [place] [address], [place], [place] [address]   SARS Coronavirus 2 (CEPHEID- Performed in [place] hospital lab), Hosp Order     Status: None   Collection Time: 02/11/19  8:59 AM   Specimen: Nasopharyngeal Swab  Result Value Ref Range Status   SARS Coronavirus 2 NEGATIVE NEGATIVE Final    Comment: (NOTE) If result is NEGATIVE SARS-CoV-2 target nucleic acids are NOT DETECTED. The SARS-CoV-2 RNA is generally detectable in upper and lower  respiratory specimens during the acute phase of infection. The lowest  concentration of SARS-CoV-2 viral copies this assay can detect is 250  copies / mL. A negative result does not preclude SARS-CoV-2 infection  and should not be used as the sole basis for treatment or other  patient management decisions.  A negative result may occur with  improper specimen collection / handling, submission of specimen other  than nasopharyngeal swab, presence of viral mutation(s) within the  areas targeted by this assay, and inadequate number of viral copies  (<250 copies / mL). A negative result must be combined with clinical  observations, patient history, and epidemiological information. If result is  POSITIVE SARS-CoV-2 target nucleic acids are DETECTED. The SARS-CoV-2 RNA is generally detectable in upper and lower  respiratory specimens dur ing the acute phase of infection.  Positive  results are indicative of active infection with SARS-CoV-2.  Clinical  correlation with patient history and other diagnostic information is  necessary to determine patient infection status.  Positive results do  not rule out bacterial infection or co-infection with other viruses. If result is PRESUMPTIVE POSTIVE SARS-CoV-2 nucleic acids MAY BE PRESENT.   A presumptive positive result was obtained on the submitted specimen  and confirmed on repeat testing.  While 2019 novel coronavirus  (SARS-CoV-2) nucleic acids may be present in the submitted sample  additional confirmatory testing may be necessary for epidemiological  and / or clinical management purposes  to differentiate between  SARS-CoV-2 and other Sarbecovirus currently known to infect humans.  If clinically indicated additional testing with an alternate test  methodology [phone]) is advised. The SARS-CoV-2 RNA is generally  detectable in upper and lower respiratory sp ecimens during the acute  phase of infection. The expected result is Negative. Fact Sheet for Patients:  StrictlyIdeas.no Fact Sheet for Healthcare Providers: BankingDealers.co.za This test is not yet approved or cleared by the [place] FDA and has been authorized for detection and/or diagnosis of SARS-CoV-2 by FDA under an Emergency Use Authorization (EUA).  This EUA will remain in effect (meaning this test can be used) for the duration of the COVID-19 declaration under Section 564(b)(1) of the Act, 21 U.S.C. section 360bbb-3(b)(1), unless the authorization is terminated or revoked sooner. Performed at [facility], [place] [address], [place], [place] [address]     Radiological Exams on Admission: Ct  Angio Chest Pe W And/or Wo Contrast  Result Date: 02/11/2019 CLINICAL DATA:  78 year old male with hypoxemia and respiratory failure. A stent hospitalization for COPD exacerbation. EXAM: CT ANGIOGRAPHY CHEST WITH CONTRAST TECHNIQUE: Multidetector CT imaging of the chest was performed using the standard protocol during bolus administration of intravenous contrast. Multiplanar CT image reconstructions and MIPs were obtained to evaluate the vascular anatomy. CONTRAST:  178mL OMNIPAQUE IOHEXOL 350 MG/ML SOLN COMPARISON:  Chest radiograph dated 02/11/2019 and CT dated 12/25/2018 FINDINGS: Evaluation of this exam is limited due to respiratory motion artifact. Cardiovascular: There is no cardiomegaly or pericardial effusion. Multi vessel coronary vascular calcification. There is moderate atherosclerotic calcification of the thoracic aorta. No aneurysmal dilatation or evidence of dissection. The origins of the great vessels of the aortic arch appear patent as visualized. Evaluation of the pulmonary arteries is limited due to respiratory motion artifact as well as suboptimal opacification and timing of the contrast. No definite large or central pulmonary artery embolus identified. Mediastinum/Nodes: There is no hilar or mediastinal adenopathy. The esophagus and the thyroid gland are grossly unremarkable. No mediastinal fluid collection. Lungs/Pleura: Moderate to severe emphysema with bullous changes of the right lung base. Bibasilar streaky and subsegmental densities noted. Overall interval decrease in the consolidative changes of the left lower lobe. This may represent atelectatic changes/scarring, although residual or recurrent pneumonia is not excluded. Clinical correlation is recommended. There is no pleural effusion or pneumothorax. A 9 mm focal nodularity in the right middle lobe (series 11, image 136) appears similar to prior CT and may represent an area of scarring. Attention on follow-up imaging recommended. The  central airways are patent. Upper Abdomen: A most consistent with a cyst. 4.7 cm hypodense lesion from the upper pole of the right kidney Musculoskeletal: Degenerative changes of the spine. No acute osseous pathology. Review of the MIP images confirms the above findings. IMPRESSION: 1. No definite large or central pulmonary artery embolus on limited study. 2. Moderate to severe emphysema with bullous changes of the right lung base. 3. Bibasilar streaky and subsegmental densities may represent atelectasis/scarring versus residual or  recurrent pneumonia. Clinical correlation is recommended. Overall interval improvement of the aeration of the left lower lobe with decrease in the size of consolidative area since the prior CT 4. A 9 mm focal nodularity in the right middle lobe appears similar to prior CT and may represent scarring. Attention on follow-up imaging recommended. 5. Aortic Atherosclerosis (ICD10-I70.0) and Emphysema (ICD10-J43.9). Electronically Signed   By: Anner Crete M.D.   On: 02/11/2019 12:41   Dg Chest Portable 1 View  Result Date: 02/11/2019 CLINICAL DATA:  78 year old with shortness of breath.  Hypoxia. EXAM: PORTABLE CHEST 1 VIEW COMPARISON:  02/08/2027 and on 12/24/2018.  Chest CT 12/25/2018 FINDINGS: Again noted is hyperinflation. Few densities at the left lung base are suggestive for atelectasis. Right costophrenic angle is incompletely imaged on this examination. Heart size is upper limits of normal but stable. Atherosclerotic calcifications at the aortic arch. No focal airspace disease or pulmonary edema. Patchy densities at the right lung base appear chronic. IMPRESSION: 1. Patchy densities at the lung bases are suggestive for atelectasis and/or scarring. 2. Hyperinflation and emphysema. Electronically Signed   By: Markus Daft M.D.   On: 02/11/2019 09:06   EKG: None done in the ED this Admission so will order one now.   Assessment/Plan Active Problems:   Acute exacerbation of  chronic obstructive pulmonary disease (COPD) (HCC)  Acute on Chronic Respiratory Failure with Hypoxia in the setting of Acute COPD Exacerbation with concern for Developing PNA -Presented with hypoxia on admission and was saturating 50% on 3 L and was very dyspneic along with some wheezing per EDP; currently wears 3-4 Liters Chronically -Chest x-ray on admission showed patchy densities at the lung bases suggestive for atelectasis or scarring and hyperinflation and emphysema was noted -Admit to inpatient stepdown unit given his tenuous respiratory status as patient had to be placed on a nonrebreather prior to admission -Patient received IM epi along with 125 mg of Solu-Medrol and 2 g of mag sulfate -Likely secondary to COPD exacerbation however we will need to rule out pneumonia and PE and have obtained a CTA of the chest with contrast which is still currently pending -Currently now off of nonrebreather and back on 4 L of nasal cannula -Continuous pulse oximetry and maintain O2 saturation greater than 92% -Continue supplemental oxygen via nasal cannula and wean O2 as tolerated -We will add CPAP nightly -Added IV Solu-Medrol 60 mg every 6, Xopenex/Atrovent every 6 scheduled, along with budesonide and Brovana nebulized breathing treatments -Also will start IV doxycycline for antibiotic coverage; patient received IV cefepime and IV vancomycin in the ED -Repeat chest x-ray in the a.m.  -We will need home amatory screen prior to discharge  Acute COPD Exacerbation -As above we will start on scheduled nebs, steroids, supplemental oxygen, along with antibiotics with doxycycline -We will hold patient's COPD medications at home including Trelegy and Combivent along with albuterol inhaler nebulized albuterol -We will need further improvement as discharged from the hospital 02/09/2019  ? Right Sided PNA -There are patchy densities at the right lung base that appeared chronic but will need to rule out  pneumonia -CTA of the chest is being done still pending -Obtain SLP evaluation to ensure the patient is not aspirating -Obtain blood cultures x2, and repeat chest x-ray in the a.m. -Follow CTA of the chest and monitor temperature curve. -Currently patient is afebrile and has no leukocytosis but likely leukocytosis will worsen the setting of IV steroid demargination -SARS-CoV-2 testing was negative   OSA -We  will add CPAP at night  B12 Deficiency -Continue Cyanocobalamin 100 mcg p.o. nightly  Normochromic Normocytic Anemia -Patient's hemoglobin/hematocrit on admission was 12.6/41.0 -Check anemia panel in a.m. -Continue to monitor for signs and symptoms of bleeding; currently no overt bleeding noted -Repeat CBC in a.m.  Hyperglycemia in the setting of IFG and Prior DM Type 2 -Appears to be diet-controlled and he is not taking any medications at home -Last hemoglobin A1c done on 12/25/2018 was 5.7 -Likely blood sugars will be, elevated in the setting of IV steroid demargination We will place on sensitive NovoLog sliding scale insulin before meals and at bedtime -Continue to monitor CBGs closely  HLD -Continue with simvastatin 40 mg p.o. nightly  HTN -Continue with losartan 50 mg p.o. nightly -Blood pressure on admission was 171/80 and we will add IV hydralazine 10 mg every 6 PRN for systolic blood pressure greater than 413 or diastolic blood pressure greater than 100  Depression and Anxiety -Continue with escitalopram 5 mg p.o. daily  Chronic Pain Syndrome -Continue with cyclobenzaprine 10 mg p.o. twice daily as needed for muscle spasms -Currently holding his home ibuprofen and will also resume Voltaren gel if necessary  DVT prophylaxis: Enoxaparin 40 mg sq q24h Code Status: FULL CODE Family Communication: No Family present at bedside  Disposition Plan: Pending further Respiratory Improvement back to baseline and PT/OT Evaluation Consults called: None Admission status:  Inpatient SDU   Severity of Illness: The appropriate patient status for this patient is INPATIENT. Inpatient status is judged to be reasonable and necessary in order to provide the required intensity of service to ensure the patient's safety. The patient's presenting symptoms, physical exam findings, and initial radiographic and laboratory data in the context of their chronic comorbidities is felt to place them at high risk for further clinical deterioration. Furthermore, it is not anticipated that the patient will be medically stable for discharge from the hospital within 2 midnights of admission. The following factors support the patient status of inpatient.   " The patient's presenting symptoms include SOB and Hypoxia. " The worrisome physical exam findings include mild wheezing and Diminished breath sounds. " The initial radiographic and laboratory data are worrisome because of ? PNA . " The chronic co-morbidities are listed above in the HPI.  * I certify that at the point of admission it is my clinical judgment that the patient will require inpatient hospital care spanning beyond 2 midnights from the point of admission due to high intensity of service, high risk for further deterioration and high frequency of surveillance required.Kerney Elbe, D.O. [facility] PAGER is on [place]  If 7PM-7AM, please contact night-coverage [email or web address] Password [email or web address]  02/11/2019, 12:53 PM

## 2019-02-11 NOTE — Progress Notes (Signed)
A consult was received from an ED physician for Vancomycin & Cefepime per pharmacy dosing.  The patient's profile has been reviewed for ht/wt/allergies/indication/available labs.    A one time order has been placed for Cefepime 2gm and Vancomycin 2gm.  Further antibiotics/pharmacy consults should be ordered by admitting physician if indicated.                       Thank you, Minda Ditto 02/11/2019  9:26 AM

## 2019-02-12 LAB — COMPREHENSIVE METABOLIC PANEL
ALT: 25 U/L (ref 0–44)
AST: 19 U/L (ref 15–41)
Albumin: 3.6 g/dL (ref 3.5–5.0)
Alkaline Phosphatase: 61 U/L (ref 38–126)
Anion gap: 12 (ref 5–15)
BUN: 23 mg/dL (ref 8–23)
CO2: 24 mmol/L (ref 22–32)
Calcium: 8.7 mg/dL — ABNORMAL LOW (ref 8.9–10.3)
Chloride: 103 mmol/L (ref 98–111)
Creatinine, Ser: 0.75 mg/dL (ref 0.61–1.24)
GFR calc Af Amer: >60 mL/min (ref >60)
GFR calc non Af Amer: >60 mL/min (ref >60)
Glucose, Bld: 150 mg/dL — ABNORMAL HIGH (ref 70–99)
Potassium: 4.2 mmol/L (ref 3.5–5.1)
Sodium: 139 mmol/L (ref 135–145)
Total Bilirubin: 0.2 mg/dL — ABNORMAL LOW (ref 0.3–1.2)
Total Protein: 6.6 g/dL (ref 6.5–8.1)

## 2019-02-12 LAB — GLUCOSE, CAPILLARY
Glucose-Capillary: 142 mg/dL — ABNORMAL HIGH (ref 70–99)
Glucose-Capillary: 155 mg/dL — ABNORMAL HIGH (ref 70–99)
Glucose-Capillary: 143 mg/dL — ABNORMAL HIGH (ref 70–99)
Glucose-Capillary: 143 mg/dL — ABNORMAL HIGH (ref 70–99)

## 2019-02-12 LAB — BLOOD CULTURE ID PANEL (REFLEXED)

## 2019-02-12 LAB — CBC
HCT: 36.7 % — ABNORMAL LOW (ref 39.0–52.0)
Hemoglobin: 11.6 g/dL — ABNORMAL LOW (ref 13.0–17.0)
MCH: 30.1 pg (ref 26.0–34.0)
MCHC: 31.6 g/dL (ref 30.0–36.0)
MCV: 95.3 fL (ref 80.0–100.0)
Platelets: 175 10*3/uL (ref 150–400)
RBC: 3.85 MIL/uL — ABNORMAL LOW (ref 4.22–5.81)
RDW: 13.8 % (ref 11.5–15.5)
WBC: 2.9 10*3/uL — ABNORMAL LOW (ref 4.0–10.5)
nRBC: 0 % (ref 0.0–0.2)

## 2019-02-12 LAB — URINE CULTURE: Culture: NO GROWTH

## 2019-02-12 LAB — HIV ANTIBODY (ROUTINE TESTING W REFLEX): HIV Screen 4th Generation wRfx: NONREACTIVE

## 2019-02-12 LAB — MAGNESIUM: Magnesium: 2.1 mg/dL (ref 1.7–2.4)

## 2019-02-12 LAB — PROCALCITONIN: Procalcitonin: 0.15 ng/mL

## 2019-02-12 MED ORDER — IPRATROPIUM BROMIDE 0.02 % IN SOLN
0.5000 mg | Freq: Four times a day (QID) | RESPIRATORY_TRACT | Status: DC
  Administered 2019-02-13: 0.5 mg via RESPIRATORY_TRACT
  Filled 2019-02-12 (×2): qty 2.5

## 2019-02-12 MED ORDER — ADULT MULTIVITAMIN W/MINERALS CH
1.0000 | ORAL_TABLET | Freq: Every day | ORAL | Status: DC
  Administered 2019-02-12 – 2019-02-13 (×2): 1 via ORAL
  Filled 2019-02-12 (×2): qty 1

## 2019-02-12 MED ORDER — PREDNISONE 20 MG PO TABS
40.0000 mg | ORAL_TABLET | Freq: Every day | ORAL | Status: DC
  Administered 2019-02-13: 40 mg via ORAL
  Filled 2019-02-12: qty 2

## 2019-02-12 MED ORDER — CHLORHEXIDINE GLUCONATE CLOTH 2 % EX PADS
6.0000 | MEDICATED_PAD | Freq: Every day | CUTANEOUS | Status: DC
  Administered 2019-02-12: 6 via TOPICAL

## 2019-02-12 MED ORDER — GUAIFENESIN-DM 100-10 MG/5ML PO SYRP
5.0000 mL | ORAL_SOLUTION | ORAL | Status: DC | PRN
  Administered 2019-02-12 (×2): 5 mL via ORAL
  Filled 2019-02-12 (×2): qty 10

## 2019-02-12 MED ORDER — LEVALBUTEROL HCL 0.63 MG/3ML IN NEBU
0.6300 mg | INHALATION_SOLUTION | Freq: Four times a day (QID) | RESPIRATORY_TRACT | Status: DC
  Administered 2019-02-13: 0.63 mg via RESPIRATORY_TRACT
  Filled 2019-02-12 (×2): qty 3

## 2019-02-12 NOTE — Evaluation (Signed)
Physical Therapy Evaluation Patient Details Name: Cory Kemp MRN: [id number] DOB: [birth year] Today's Date: 02/12/2019   History of Present Illness  78 yo male admitted with COPD exac. Hx of COPD, DM, anemia, ETOH abuse, dementia, chronic pain, leukopenia  Clinical Impression  On eval, pt was Supv-Mod Ind with mobility. He walked ~100 feet in the hallway (after walking length of hallway with nursing). O2 sat 95% on 4L [place]. Dyspnea 2/4. Do not anticipate any f/u PT needs. 1x eval. Will sign off. Recommend daily ambulation in hallway with nursing supervision.     Follow Up Recommendations No PT follow up    Equipment Recommendations  None recommended by PT    Recommendations for Other Services       Precautions / Restrictions Precautions Precaution Comments: O2 dep Restrictions Weight Bearing Restrictions: No      Mobility  Bed Mobility               General bed mobility comments: sitting EOB  Transfers Overall transfer level: Modified independent   Transfers: Sit to/from Stand              Ambulation/Gait Ambulation/Gait assistance: Supervision Gait Distance (Feet): 100 Feet Assistive device: None Gait Pattern/deviations: Step-through pattern;Decreased stride length     General Gait Details: for safety. No LOB. O2 sat 95% on 4L [place].  Stairs            Wheelchair Mobility    Modified Rankin (Stroke Patients Only)       Balance Overall balance assessment: Mild deficits observed, not formally tested                                           Pertinent Vitals/Pain Pain Assessment: No/denies pain    Home Living Family/patient expects to be discharged to:: Private residence Living Arrangements: Non-relatives/Friends Available Help at Discharge: Friend(s) Type of Home: Mobile home Home Access: Stairs to enter   Entrance Stairs-Number of Steps: 4-5 Home Layout: One level        Prior Function Level of Independence:  Independent         Comments: Drives. Enjoys traveling (drives cross-country). Wears 3-4L O2; self-monitors with pulse ox. Works at [employer]        Extremity/Trunk Assessment   Upper Extremity Assessment Upper Extremity Assessment: Overall WFL for tasks assessed    Lower Extremity Assessment Lower Extremity Assessment: Generalized weakness    Cervical / Trunk Assessment Cervical / Trunk Assessment: Normal  Communication   Communication: No difficulties  Cognition Arousal/Alertness: Awake/alert Behavior During Therapy: WFL for tasks assessed/performed Overall Cognitive Status: Within Functional Limits for tasks assessed                                        General Comments      Exercises     Assessment/Plan    PT Assessment Patent does not need any further PT services  PT Problem List         PT Treatment Interventions      PT Goals (Current goals can be found in the Care Plan section)  Acute Rehab PT Goals Patient Stated Goal: none stated PT Goal Formulation: All assessment and education complete, DC therapy    Frequency  Barriers to discharge        Co-evaluation               AM-PAC PT "6 Clicks" Mobility  Outcome Measure Help needed turning from your back to your side while in a flat bed without using bedrails?: None Help needed moving from lying on your back to sitting on the side of a flat bed without using bedrails?: None Help needed moving to and from a bed to a chair (including a wheelchair)?: None Help needed standing up from a chair using your arms (e.g., wheelchair or bedside chair)?: None Help needed to walk in hospital room?: A Little Help needed climbing 3-5 steps with a railing? : A Little 6 Click Score: 22    End of Session Equipment Utilized During Treatment: Oxygen Activity Tolerance: Patient tolerated treatment well Patient left: in bed;with call bell/phone within  reach        Time: 1452-1505 PT Time Calculation (min) (ACUTE ONLY): 13 min   Charges:   PT Evaluation $PT Eval Low Complexity: [place], PT Acute Rehabilitation Services Pager: [phone] Office: [phone]

## 2019-02-12 NOTE — Progress Notes (Signed)
Cory Kemp 1239 transferred to 1408 in w/chair with o2 4l [place]. Report was called to [place] 4th floor.

## 2019-02-12 NOTE — Progress Notes (Signed)
OT Cancellation Note  Patient Details Name: Cory Kemp MRN: [id number] DOB: April 07, 1941   Cancelled Treatment:    Reason Eval/Treat Not Completed: OT screened, no needs identified, will sign off.  Pt did not feel he needed OT.  Moving well with PT. Will sign off.  Josephine Wooldridge 02/12/2019, 3:17 PM  Lesle Chris, OTR/L Acute Rehabilitation Services [phone] [facility] pager [phone] office 02/12/2019

## 2019-02-12 NOTE — Progress Notes (Signed)
Pt refused CPAP qhs.  Pt encouraged to contact RT should he change his mind.   

## 2019-02-12 NOTE — Progress Notes (Signed)
SATURATION QUALIFICATIONS: (This note is used to comply with regulatory documentation for home oxygen)  Patient Saturations on Room Air at Rest = 96%  Patient Saturations on Room Air while Ambulating = pt on 4l at home, unable to assess on room air  Patient Saturations on 4 Liters of oxygen while Ambulating = 94%  Please briefly explain why patient needs home oxygen:

## 2019-02-12 NOTE — Progress Notes (Signed)
PHARMACY - PHYSICIAN COMMUNICATION CRITICAL VALUE ALERT - BLOOD CULTURE IDENTIFICATION (BCID)  Cory Kemp is an 78 y.o. male who presented to [facility] on 02/11/2019 with a chief complaint of SOB  Assessment:  1/4 BCx bottles with bacteremia (suspect contam as no clinical correlation noted)  Name of physician (or Provider) Contacted: Cephus Slater.  Current antibiotics: Doxycycline  Changes to prescribed antibiotics recommended: none Patient is on recommended antibiotics - No changes needed  Results for orders placed or performed during the hospital encounter of 02/11/19  Blood Culture ID Panel (Reflexed) (Collected: 02/11/2019  8:50 AM)  Result Value Ref Range   Enterococcus species NOT DETECTED NOT DETECTED   Listeria monocytogenes NOT DETECTED NOT DETECTED   Staphylococcus species DETECTED (A) NOT DETECTED   Staphylococcus aureus (BCID) NOT DETECTED NOT DETECTED   Methicillin resistance DETECTED (A) NOT DETECTED   Streptococcus species NOT DETECTED NOT DETECTED   Streptococcus agalactiae NOT DETECTED NOT DETECTED   Streptococcus pneumoniae NOT DETECTED NOT DETECTED   Streptococcus pyogenes NOT DETECTED NOT DETECTED   Acinetobacter baumannii NOT DETECTED NOT DETECTED   Enterobacteriaceae species NOT DETECTED NOT DETECTED   Enterobacter cloacae complex NOT DETECTED NOT DETECTED   Escherichia coli NOT DETECTED NOT DETECTED   Klebsiella oxytoca NOT DETECTED NOT DETECTED   Klebsiella pneumoniae NOT DETECTED NOT DETECTED   Proteus species NOT DETECTED NOT DETECTED   Serratia marcescens NOT DETECTED NOT DETECTED   Haemophilus influenzae NOT DETECTED NOT DETECTED   Neisseria meningitidis NOT DETECTED NOT DETECTED   Pseudomonas aeruginosa NOT DETECTED NOT DETECTED   Candida albicans NOT DETECTED NOT DETECTED   Candida glabrata NOT DETECTED NOT DETECTED   Candida krusei NOT DETECTED NOT DETECTED   Candida parapsilosis NOT DETECTED NOT DETECTED   Candida tropicalis NOT DETECTED NOT  DETECTED    Sohail Capraro A 02/12/2019  9:44 AM

## 2019-02-12 NOTE — Progress Notes (Signed)
Initial Nutrition Assessment  DOCUMENTATION CODES:   Not applicable  INTERVENTION:  - will order daily multivitamin with minerals. - continue to encourage PO intakes.    NUTRITION DIAGNOSIS:   Increased nutrient needs related to acute illness as evidenced by estimated needs.  GOAL:   Patient will meet greater than or equal to 90% of their needs  MONITOR:   PO intake, Labs, Weight trends  REASON FOR ASSESSMENT:   Consult Assessment of nutrition requirement/status  ASSESSMENT:   78 year-old male with medical history significant for history of alcohol abuse, anemia, B12 deficiency, BPH, COPD, type 2 DM, hyperlipidemia, HTN, history of leukopenia, questionable dementia. and other comorbidities. He presented to the ED with chief complaint of worsening shortness of breath.  Patient was recently discharged from the hospital for acute on chronic COPD exacerbation; discharged on 7/4. He started feeling okay but 7/6 AM he noted significantly worsened and it was difficult for him to catch his breath.  He tried to rest and is still short of breath and EMS was called.  When EMS arrived patient was saturating 50% on 3 L and so they placed him on a nonrebreather and gave him IV Solu-Medrol, and IM epinephrine injection, as well as IV magnesium sulfate. Stated coughing up some clear sputum which has not worsened from baseline.  Patient admitted for acute on chronic respiratory failure with hypoxia and acute exacerbation of COPD.  Weight has been stable the past 2 months. Patient was recently admitted with d/c on 7/4. He reports that during that time he had a very good appetite and was mainly eating 75-100% of meals. He has a good appetite at baseline and has no difficulties with chewing or swallowing. No abdominal pain/pressure or nausea.   Patient was transferred from [place] to [place] a short time ago.  SLP worked with patient yesterday and recommended regular consistency foods and thin  liquids.   Medications reviewed; 1000 units vitamin D/day, sliding scale novolog, 60 mg solu-medrol QID, 250 mcg oral cyanocobalamin/day.  Labs reviewed; CBG: 142 mg/dl today, Ca: 8.7 mg/dl.  Last HgbA1c was on 12/25/18 and was 5.7%    NUTRITION - FOCUSED PHYSICAL EXAM:  completed; no muscle and no fat wasting.   Diet Order:   Diet Order            Diet Heart Room service appropriate? Yes; Fluid consistency: Thin  Diet effective now              EDUCATION NEEDS:   No education needs have been identified at this time  Skin:  Skin Assessment: Reviewed RN Assessment  Last BM:  7/5  Height:   Ht Readings from Last 1 Encounters:  02/11/19 6\' 1"  (1.854 m)    Weight:   Wt Readings from Last 1 Encounters:  02/11/19 96.8 kg    Ideal Body Weight:  83.6 kg  BMI:  Body mass index is 28.16 kg/m.  Estimated Nutritional Needs:   Kcal:  1940-2130 kcal  Protein:  110-125 grams  Fluid:  >/= 2 L/day      Jarome Matin, MS, RD, LDN, [facility] Inpatient Clinical Dietitian Pager # [phone] After hours/weekend pager # [phone]

## 2019-02-12 NOTE — Progress Notes (Signed)
PROGRESS NOTE    [name]  WVP:[id number] DOB: 1941-01-28 DOA: 02/11/2019 PCP: Jani Gravel, MD   Brief Narrative:  Cory Kemp is Cory Kemp 78 y.o. male with medical history significant for but not limited too history of alcohol abuse, anemia, B12 deficiency, BPH, COPD, diabetes mellitus type 2, hyperlipidemia, hypertension, history of leukopenia and questionable dementia along with other comorbidities who presents with Cory Kemp chief complaint of worsening shortness of breath this morning.  Patient was recently discharged from the hospital for acute on chronic COPD exacerbation discharged on 02/09/2019.  He went home he started feeling okay but this morning he significantly worsened and it was difficult for him to catch his breath.  He tried to rest and is still short of breath and EMS was called.  When EMS arrived patient was saturating 50% on 3 L and so they placed him on Wenzel Backlund nonrebreather and gave him IV Solu-Medrol, and IM epinephrine injection, as well as IV magnesium sulfate.  Patient denies chest pain, lightheadedness or dizziness but does feel weak and states that his shortness breath was fairly significant today.  States that he is coughing up some clear sputum which has not worsened from baseline.  No other concerns or complaints at this time.  TRH was called to admit this patient for an acute on chronic respiratory failure with hypoxia as well as acute exacerbation of COPD  ED Course: In the ED patient had basic blood work done as well as Monaye Blackie chest x-ray. CTA of the chest was also done and still pending.  SARS-CoV-2 testing was negative.  Assessment & Plan:   Principal Problem:   Acute exacerbation of chronic obstructive pulmonary disease (COPD) (HCC) Active Problems:   Diabetes mellitus (HCC)   Normochromic anemia   B12 deficiency   COPD (chronic obstructive pulmonary disease) (HCC)   Essential hypertension   OSA (obstructive sleep apnea)   Chronic respiratory failure with hypoxia (HCC)   Acute  on chronic respiratory failure with hypoxia (HCC)   COPD with acute exacerbation (HCC)  Acute on Chronic Respiratory Failure with Hypoxia in the setting of Acute COPD Exacerbation with concern for Developing PNA - Hypoxic on presenation, satting in the 50's on 3 L - Improved today, still not back to baseline - currently on 4 L [place] - wean for O2 sat 88-92% -Chest x-ray on admission showed patchy densities at the lung bases suggestive for atelectasis or scarring and hyperinflation and emphysema was noted -CT showed no PE, mod to severe emphysema, bibasilar streaky and subsegmental densities may represent atelectasis/scarring vs residual or recurrent pneumonia -Currently now off of nonrebreather and back on 4 L of nasal cannula -We will add CPAP nightly  - transition to PO steroids 7/8 - Continue doxycycline, will hold on broader coverage for pneumonia (PCT 0.15) - repeat CXR 7/8  - follow blood cultures - negative COVID 19 testing - scheduled and prn nebs (home meds on hold) -We will need home ambulatory screen prior to discharge  1 of 2 blood cx with coag negative staph (methicillin resistant) - suspect contaminant, continue to monitor  OSA -We will add CPAP at night  B12 Deficiency -Continue Cyanocobalamin 100 mcg p.o. nightly  Normochromic Normocytic Anemia -continue to monitor -Check iron, b12, folate, ferritin in AM  Leukopenia - add on differential, follow outpatient   Hyperglycemia in the setting of IFG and Prior DM Type 2 -Appears to be diet-controlled and he is not taking any medications at home -Last hemoglobin A1c done  on 12/25/2018 was 5.7 -Likely blood sugars will be, elevated in the setting of IV steroid demargination - We will place on sensitive NovoLog sliding scale insulin before meals and at bedtime -Continue to monitor CBGs closely  HLD -Continue with simvastatin 40 mg p.o. nightly  HTN -Continue with losartan 50 mg p.o. nightly -improved BP,  continue to monitor  Depression and Anxiety -Continue with escitalopram 5 mg p.o. daily  Chronic Pain Syndrome -Continue with cyclobenzaprine 10 mg p.o. twice daily as needed for muscle spasms -Currently holding his home ibuprofen and will also resume Voltaren gel if necessary  9 mm focal nodularity in R middle lobe: possible scarring, follow outpatient  DVT prophylaxis: lovenox Code Status: full  Family Communication: none at bedside Disposition Plan: continue inpatient for continued treatment of COPD exacerbation  Consultants:   none  Procedures:   none  Antimicrobials: Anti-infectives (From admission, onward)   Start     Dose/Rate Route Frequency Ordered Stop   02/11/19 1400  doxycycline (VIBRAMYCIN) 100 mg in sodium chloride 0.9 % 250 mL IVPB     100 mg 125 mL/hr over 120 Minutes Intravenous Every 12 hours 02/11/19 1249     02/11/19 1000  vancomycin (VANCOCIN) 2,000 mg in sodium chloride 0.9 % 500 mL IVPB     2,000 mg 250 mL/hr over 120 Minutes Intravenous  Once 02/11/19 0920 02/11/19 1250   02/11/19 0930  ceFEPIme (MAXIPIME) 2 g in sodium chloride 0.9 % 100 mL IVPB     2 g 200 mL/hr over 30 Minutes Intravenous  Once 02/11/19 0919 02/11/19 1018      Subjective: Feels better than yesterday Not quite back to baseline Came back after recent discharge with worsening SOB  Objective: Vitals:   02/12/19 1107 02/12/19 1112 02/12/19 1126 02/12/19 1346  BP: (!) 137/56  136/69   Pulse: 86 90 88 85  Resp: (!) 25 20 16 18   Temp:   98.3 F (36.8 C)   TempSrc:   Oral   SpO2: 94% 96% 97% 95%  Weight:      Height:        Intake/Output Summary (Last 24 hours) at 02/12/2019 1547 Last data filed at 02/12/2019 1449 Gross per 24 hour  Intake 1148.01 ml  Output 2450 ml  Net -1301.99 ml   Filed Weights   02/11/19 0838 02/11/19 1458  Weight: 95.3 kg 96.8 kg    Examination:  General exam: Appears calm and comfortable  Respiratory system: diffuse mild expiratory  wheezing Cardiovascular system: S1 & S2 heard, RRR.  Gastrointestinal system: Abdomen is nondistended, soft and nontender Central nervous system: Alert and oriented. No focal neurological deficits. Extremities: no LEE Skin: No rashes, lesions or ulcers Psychiatry: Judgement and insight appear normal. Mood & affect appropriate.     Data Reviewed: I have personally reviewed following labs and imaging studies  CBC: Recent Labs  Lab 02/08/19 1737 02/08/19 1819 02/09/19 0443 02/11/19 0859 02/12/19 0828  WBC 5.0  --  1.3* 4.0 2.9*  NEUTROABS 1.4*  --   --  2.6  --   HGB 13.3 13.9 11.7* 12.6* 11.6*  HCT 42.2 41.0 36.1* 41.0 36.7*  MCV 95.7  --  92.8 98.6 95.3  PLT 212  --  179 192 270   Basic Metabolic Panel: Recent Labs  Lab 02/08/19 1737 02/08/19 1819 02/09/19 0443 02/11/19 0859 02/12/19 0828  NA 141 142 138 142 139  K 4.3 4.2 4.3 4.4 4.2  CL 105  --  105 105  103  CO2 26  --  26 27 24   GLUCOSE 127*  --  232* 206* 150*  BUN 15  --  18 27* 23  CREATININE 0.96  --  0.87 0.93 0.75  CALCIUM 9.0  --  8.7* 8.7* 8.7*  MG  --   --   --   --  2.1   GFR: Estimated Creatinine Clearance: 94.8 mL/min (by C-G formula based on SCr of 0.75 mg/dL). Liver Function Tests: Recent Labs  Lab 02/08/19 1737 02/11/19 0859 02/12/19 0828  AST 22 25 19   ALT 18 27 25   ALKPHOS 82 69 61  BILITOT 0.6 0.4 0.2*  PROT 7.7 7.2 6.6  ALBUMIN 3.9 3.9 3.6   No results for input(s): LIPASE, AMYLASE in the last 168 hours. No results for input(s): AMMONIA in the last 168 hours. Coagulation Profile: No results for input(s): INR, PROTIME in the last 168 hours. Cardiac Enzymes: No results for input(s): CKTOTAL, CKMB, CKMBINDEX, TROPONINI in the last 168 hours. BNP (last 3 results) No results for input(s): PROBNP in the last 8760 hours. HbA1C: No results for input(s): HGBA1C in the last 72 hours. CBG: Recent Labs  Lab 02/09/19 1147 02/11/19 1713 02/11/19 2047 02/12/19 0755 02/12/19 1129    GLUCAP 133* 179* 140* 142* 155*   Lipid Profile: No results for input(s): CHOL, HDL, LDLCALC, TRIG, CHOLHDL, LDLDIRECT in the last 72 hours. Thyroid Function Tests: No results for input(s): TSH, T4TOTAL, FREET4, T3FREE, THYROIDAB in the last 72 hours. Anemia Panel: No results for input(s): VITAMINB12, FOLATE, FERRITIN, TIBC, IRON, RETICCTPCT in the last 72 hours. Sepsis Labs: Recent Labs  Lab 02/08/19 1815 02/08/19 2047 02/11/19 0859 02/12/19 0828  PROCALCITON  --   --   --  0.15  LATICACIDVEN 3.3* 0.6 0.9  --     Recent Results (from the past 240 hour(s))  SARS Coronavirus 2 (CEPHEID - Performed in [facility] hospital lab), Hosp Order     Status: None   Collection Time: 02/08/19  5:35 PM   Specimen: Nasopharyngeal Swab  Result Value Ref Range Status   SARS Coronavirus 2 NEGATIVE NEGATIVE Final    Comment: (NOTE) If result is NEGATIVE SARS-CoV-2 target nucleic acids are NOT DETECTED. The SARS-CoV-2 RNA is generally detectable in upper and lower  respiratory specimens during the acute phase of infection. The lowest  concentration of SARS-CoV-2 viral copies this assay can detect is 250  copies / mL. Jermani Eberlein negative result does not preclude SARS-CoV-2 infection  and should not be used as the sole basis for treatment or other  patient management decisions.  Sanyla Summey negative result may occur with  improper specimen collection / handling, submission of specimen other  than nasopharyngeal swab, presence of viral mutation(s) within the  areas targeted by this assay, and inadequate number of viral copies  (<250 copies / mL). Yordi Krager negative result must be combined with clinical  observations, patient history, and epidemiological information. If result is POSITIVE SARS-CoV-2 target nucleic acids are DETECTED. The SARS-CoV-2 RNA is generally detectable in upper and lower  respiratory specimens dur ing the acute phase of infection.  Positive  results are indicative of active infection with  SARS-CoV-2.  Clinical  correlation with patient history and other diagnostic information is  necessary to determine patient infection status.  Positive results do  not rule out bacterial infection or co-infection with other viruses. If result is PRESUMPTIVE POSTIVE SARS-CoV-2 nucleic acids MAY BE PRESENT.   Kamrin Sibley presumptive positive result was obtained on the  submitted specimen  and confirmed on repeat testing.  While 2019 novel coronavirus  (SARS-CoV-2) nucleic acids may be present in the submitted sample  additional confirmatory testing may be necessary for epidemiological  and / or clinical management purposes  to differentiate between  SARS-CoV-2 and other Sarbecovirus currently known to infect humans.  If clinically indicated additional testing with an alternate test  methodology [phone]) is advised. The SARS-CoV-2 RNA is generally  detectable in upper and lower respiratory sp ecimens during the acute  phase of infection. The expected result is Negative. Fact Sheet for Patients:  StrictlyIdeas.no Fact Sheet for Healthcare Providers: BankingDealers.co.za This test is not yet approved or cleared by the [place] FDA and has been authorized for detection and/or diagnosis of SARS-CoV-2 by FDA under an Emergency Use Authorization (EUA).  This EUA will remain in effect (meaning this test can be used) for the duration of the COVID-19 declaration under Section 564(b)(1) of the Act, 21 U.S.C. section 360bbb-3(b)(1), unless the authorization is terminated or revoked sooner. Performed at [facility], [place] [address], [place], [place] [address]   Blood culture (routine x 2)     Status: None (Preliminary result)   Collection Time: 02/11/19  8:50 AM   Specimen: BLOOD LEFT HAND  Result Value Ref Range Status   Specimen Description   Final    BLOOD LEFT HAND Performed at [facility] [address], [place],  [place] [address]    Special Requests   Final    BOTTLES DRAWN AEROBIC AND ANAEROBIC Blood Culture results may not be optimal due to an excessive volume of blood received in culture bottles Performed at [facility] [address], [place], [place] [address]    Culture  Setup Time   Final    GRAM POSITIVE COCCI IN CLUSTERS AEROBIC BOTTLE ONLY Organism ID to follow CRITICAL RESULT CALLED TO, READ BACK BY AND VERIFIED WITH: Karel Jarvis PharmD 9:40 02/12/19 ([email or web address])    Culture   Final    NO GROWTH < 24 HOURS Performed at [facility], [address], [place], [place] [address]    Report Status PENDING  Incomplete  Blood Culture ID Panel (Reflexed)     Status: Abnormal   Collection Time: 02/11/19  8:50 AM  Result Value Ref Range Status   Enterococcus species NOT DETECTED NOT DETECTED Final   Listeria monocytogenes NOT DETECTED NOT DETECTED Final   Staphylococcus species DETECTED (Kailo Kosik) NOT DETECTED Final    Comment: Methicillin (oxacillin) resistant coagulase negative staphylococcus. Possible blood culture contaminant (unless isolated from more than one blood culture draw or clinical case suggests pathogenicity). No antibiotic treatment is indicated for blood  culture contaminants. CRITICAL RESULT CALLED TO, READ BACK BY AND VERIFIED WITH: Karel Jarvis PharmD 9:40 02/12/19 ([email or web address])    Staphylococcus aureus (BCID) NOT DETECTED NOT DETECTED Final   Methicillin resistance DETECTED (Caleigha Zale) NOT DETECTED Final    Comment: CRITICAL RESULT CALLED TO, READ BACK BY AND VERIFIED WITH: Karel Jarvis PharmD 9:40 02/12/19 ([email or web address])    Streptococcus species NOT DETECTED NOT DETECTED Final   Streptococcus agalactiae NOT DETECTED NOT DETECTED Final   Streptococcus pneumoniae NOT DETECTED NOT DETECTED Final   Streptococcus pyogenes NOT DETECTED NOT DETECTED Final   Acinetobacter baumannii NOT DETECTED NOT DETECTED Final   Enterobacteriaceae species NOT DETECTED NOT DETECTED Final   Enterobacter cloacae  complex NOT DETECTED NOT DETECTED Final   Escherichia coli NOT DETECTED NOT DETECTED Final   Klebsiella  oxytoca NOT DETECTED NOT DETECTED Final   Klebsiella pneumoniae NOT DETECTED NOT DETECTED Final   Proteus species NOT DETECTED NOT DETECTED Final   Serratia marcescens NOT DETECTED NOT DETECTED Final   Haemophilus influenzae NOT DETECTED NOT DETECTED Final   Neisseria meningitidis NOT DETECTED NOT DETECTED Final   Pseudomonas aeruginosa NOT DETECTED NOT DETECTED Final   Candida albicans NOT DETECTED NOT DETECTED Final   Candida glabrata NOT DETECTED NOT DETECTED Final   Candida krusei NOT DETECTED NOT DETECTED Final   Candida parapsilosis NOT DETECTED NOT DETECTED Final   Candida tropicalis NOT DETECTED NOT DETECTED Final    Comment: Performed at [facility], [place] [address], [place], [place] [address]  SARS Coronavirus 2 (CEPHEID- Performed in [facility] hospital lab), Hosp Order     Status: None   Collection Time: 02/11/19  8:59 AM   Specimen: Nasopharyngeal Swab  Result Value Ref Range Status   SARS Coronavirus 2 NEGATIVE NEGATIVE Final    Comment: (NOTE) If result is NEGATIVE SARS-CoV-2 target nucleic acids are NOT DETECTED. The SARS-CoV-2 RNA is generally detectable in upper and lower  respiratory specimens during the acute phase of infection. The lowest  concentration of SARS-CoV-2 viral copies this assay can detect is 250  copies / mL. Burdett Pinzon negative result does not preclude SARS-CoV-2 infection  and should not be used as the sole basis for treatment or other  patient management decisions.  Athan Casalino negative result may occur with  improper specimen collection / handling, submission of specimen other  than nasopharyngeal swab, presence of viral mutation(s) within the  areas targeted by this assay, and inadequate number of viral copies  (<250 copies / mL). Jaelon Gatley negative result must be combined with clinical  observations, patient history, and epidemiological information. If result  is POSITIVE SARS-CoV-2 target nucleic acids are DETECTED. The SARS-CoV-2 RNA is generally detectable in upper and lower  respiratory specimens dur ing the acute phase of infection.  Positive  results are indicative of active infection with SARS-CoV-2.  Clinical  correlation with patient history and other diagnostic information is  necessary to determine patient infection status.  Positive results do  not rule out bacterial infection or co-infection with other viruses. If result is PRESUMPTIVE POSTIVE SARS-CoV-2 nucleic acids MAY BE PRESENT.   Jocob Dambach presumptive positive result was obtained on the submitted specimen  and confirmed on repeat testing.  While 2019 novel coronavirus  (SARS-CoV-2) nucleic acids may be present in the submitted sample  additional confirmatory testing may be necessary for epidemiological  and / or clinical management purposes  to differentiate between  SARS-CoV-2 and other Sarbecovirus currently known to infect humans.  If clinically indicated additional testing with an alternate test  methodology [phone]) is advised. The SARS-CoV-2 RNA is generally  detectable in upper and lower respiratory sp ecimens during the acute  phase of infection. The expected result is Negative. Fact Sheet for Patients:  StrictlyIdeas.no Fact Sheet for Healthcare Providers: BankingDealers.co.za This test is not yet approved or cleared by the [place] FDA and has been authorized for detection and/or diagnosis of SARS-CoV-2 by FDA under an Emergency Use Authorization (EUA).  This EUA will remain in effect (meaning this test can be used) for the duration of the COVID-19 declaration under Section 564(b)(1) of the Act, 21 U.S.C. section 360bbb-3(b)(1), unless the authorization is terminated or revoked sooner. Performed at [facility], [place] [address], [place], [place] [address]   Blood culture (routine x 2)  Status:  None (Preliminary result)   Collection Time: 02/11/19  9:00 AM   Specimen: BLOOD  Result Value Ref Range Status   Specimen Description   Final    BLOOD RIGHT ANTECUBITAL Performed at [facility] [address] [place], [place] [address]    Special Requests   Final    BOTTLES DRAWN AEROBIC AND ANAEROBIC Blood Culture adequate volume Performed at [facility] [address] [place], [place] [address]    Culture   Final    NO GROWTH < 24 HOURS Performed at [facility] [address], [place], [place] [address]    Report Status PENDING  Incomplete  Urine culture     Status: None   Collection Time: 02/11/19 12:50 PM   Specimen: Urine, Random  Result Value Ref Range Status   Specimen Description   Final    URINE, RANDOM Performed at [facility] [address] [place], [place] [address]    Special Requests   Final    NONE Performed at [facility], [place] [address], [place], [place] [address]    Culture   Final    NO GROWTH Performed at [facility], [place] [address] [place], [place] [address]    Report Status 02/12/2019 FINAL  Final  MRSA PCR Screening     Status: None   Collection Time: 02/11/19  1:31 PM   Specimen: Nasopharyngeal  Result Value Ref Range Status   MRSA by PCR NEGATIVE NEGATIVE Final    Comment:        The GeneXpert MRSA Assay (FDA approved for NASAL specimens only), is one component of Terrion Poblano comprehensive MRSA colonization surveillance program. It is not intended to diagnose MRSA infection nor to guide or monitor treatment for MRSA infections. Performed at [facility], [place] [address] [place], [place] [address]          Radiology Studies: Ct Angio Chest Pe W And/or Wo Contrast  Result Date: 02/11/2019 CLINICAL DATA:  78 year old male with hypoxemia and respiratory failure. Rejoice Heatwole stent hospitalization for COPD exacerbation. EXAM: CT  ANGIOGRAPHY CHEST WITH CONTRAST TECHNIQUE: Multidetector CT imaging of the chest was performed using the standard protocol during bolus administration of intravenous contrast. Multiplanar CT image reconstructions and MIPs were obtained to evaluate the vascular anatomy. CONTRAST:  123mL OMNIPAQUE IOHEXOL 350 MG/ML SOLN COMPARISON:  Chest radiograph dated 02/11/2019 and CT dated 12/25/2018 FINDINGS: Evaluation of this exam is limited due to respiratory motion artifact. Cardiovascular: There is no cardiomegaly or pericardial effusion. Multi vessel coronary vascular calcification. There is moderate atherosclerotic calcification of the thoracic aorta. No aneurysmal dilatation or evidence of dissection. The origins of the great vessels of the aortic arch appear patent as visualized. Evaluation of the pulmonary arteries is limited due to respiratory motion artifact as well as suboptimal opacification and timing of the contrast. No definite large or central pulmonary artery embolus identified. Mediastinum/Nodes: There is no hilar or mediastinal adenopathy. The esophagus and the thyroid gland are grossly unremarkable. No mediastinal fluid collection. Lungs/Pleura: Moderate to severe emphysema with bullous changes of the right lung base. Bibasilar streaky and subsegmental densities noted. Overall interval decrease in the consolidative changes of the left lower lobe. This may represent atelectatic changes/scarring, although residual or recurrent pneumonia is not excluded. Clinical correlation is recommended. There is no pleural effusion or pneumothorax. Belanna Manring 9 mm focal nodularity in the right middle lobe (series 11, image 136) appears similar  to prior CT and may represent an area of scarring. Attention on follow-up imaging recommended. The central airways are patent. Upper Abdomen: Keisha Amer most consistent with [place] Spargo cyst. 4.7 cm hypodense lesion from the upper pole of the right kidney Musculoskeletal: Degenerative changes of the spine. No  acute osseous pathology. Review of the MIP images confirms the above findings. IMPRESSION: 1. No definite large or central pulmonary artery embolus on limited study. 2. Moderate to severe emphysema with bullous changes of the right lung base. 3. Bibasilar streaky and subsegmental densities may represent atelectasis/scarring versus residual or recurrent pneumonia. Clinical correlation is recommended. Overall interval improvement of the aeration of the left lower lobe with decrease in the size of consolidative area since the prior CT 4. Ahna Konkle 9 mm focal nodularity in the right middle lobe appears similar to prior CT and may represent scarring. Attention on follow-up imaging recommended. 5. Aortic Atherosclerosis (ICD10-I70.0) and Emphysema (ICD10-J43.9). Electronically Signed   By: Anner Crete M.D.   On: 02/11/2019 12:41   Dg Chest Portable 1 View  Result Date: 02/11/2019 CLINICAL DATA:  78 year old with shortness of breath.  Hypoxia. EXAM: PORTABLE CHEST 1 VIEW COMPARISON:  02/08/2027 and on 12/24/2018.  Chest CT 12/25/2018 FINDINGS: Again noted is hyperinflation. Few densities at the left lung base are suggestive for atelectasis. Right costophrenic angle is incompletely imaged on this examination. Heart size is upper limits of normal but stable. Atherosclerotic calcifications at the aortic arch. No focal airspace disease or pulmonary edema. Patchy densities at the right lung base appear chronic. IMPRESSION: 1. Patchy densities at the lung bases are suggestive for atelectasis and/or scarring. 2. Hyperinflation and emphysema. Electronically Signed   By: Markus Daft M.D.   On: 02/11/2019 09:06        Scheduled Meds:  arformoterol  15 mcg Nebulization BID   budesonide (PULMICORT) nebulizer solution  0.5 mg Nebulization BID   Chlorhexidine Gluconate Cloth  6 each Topical Daily   cholecalciferol  1,000 Units Oral QHS   enoxaparin (LOVENOX) injection  40 mg Subcutaneous Q24H   escitalopram  5 mg Oral  Daily   guaiFENesin  1,200 mg Oral BID   insulin aspart  0-5 Units Subcutaneous QHS   insulin aspart  0-9 Units Subcutaneous TID WC   ipratropium  0.5 mg Nebulization Q6H   levalbuterol  0.63 mg Nebulization Q6H   losartan  50 mg Oral QHS   mouth rinse  15 mL Mouth Rinse BID   methylPREDNISolone (SOLU-MEDROL) injection  60 mg Intravenous Q6H   multivitamin with minerals  1 tablet Oral Daily   oxymetazoline  1 spray Each Nare Q3H   simvastatin  40 mg Oral QHS   vitamin B-12  250 mcg Oral QHS   Continuous Infusions:  doxycycline (VIBRAMYCIN) IV 100 mg (02/12/19 1449)     LOS: 1 day    Time spent: over 30 min    Fayrene Helper, MD [facility] Pager AMION  If 7PM-7AM, please contact night-coverage [email or web address] Password [facility] 02/12/2019, 3:47 PM

## 2019-02-13 ENCOUNTER — Inpatient Hospital Stay (HOSPITAL_COMMUNITY): Payer: Medicare HMO

## 2019-02-13 LAB — CBC
HCT: 38.8 % — ABNORMAL LOW (ref 39.0–52.0)
Hemoglobin: 12 g/dL — ABNORMAL LOW (ref 13.0–17.0)
MCH: 29.5 pg (ref 26.0–34.0)
MCHC: 30.9 g/dL (ref 30.0–36.0)
MCV: 95.3 fL (ref 80.0–100.0)
Platelets: 169 10*3/uL (ref 150–400)
RBC: 4.07 MIL/uL — ABNORMAL LOW (ref 4.22–5.81)
RDW: 13.9 % (ref 11.5–15.5)
WBC: 5.4 10*3/uL (ref 4.0–10.5)
nRBC: 0 % (ref 0.0–0.2)

## 2019-02-13 LAB — COMPREHENSIVE METABOLIC PANEL
ALT: 24 U/L (ref 0–44)
AST: 20 U/L (ref 15–41)
Albumin: 3.4 g/dL — ABNORMAL LOW (ref 3.5–5.0)
Alkaline Phosphatase: 54 U/L (ref 38–126)
Anion gap: 8 (ref 5–15)
BUN: 25 mg/dL — ABNORMAL HIGH (ref 8–23)
CO2: 27 mmol/L (ref 22–32)
Calcium: 8.9 mg/dL (ref 8.9–10.3)
Chloride: 104 mmol/L (ref 98–111)
Creatinine, Ser: 0.73 mg/dL (ref 0.61–1.24)
GFR calc Af Amer: >60 mL/min (ref >60)
GFR calc non Af Amer: >60 mL/min (ref >60)
Glucose, Bld: 95 mg/dL (ref 70–99)
Potassium: 4 mmol/L (ref 3.5–5.1)
Sodium: 139 mmol/L (ref 135–145)
Total Bilirubin: 0.4 mg/dL (ref 0.3–1.2)
Total Protein: 6.2 g/dL — ABNORMAL LOW (ref 6.5–8.1)

## 2019-02-13 LAB — CULTURE, BLOOD (ROUTINE X 2)

## 2019-02-13 LAB — IRON AND TIBC
Iron: 62 ug/dL (ref 45–182)
Saturation Ratios: 20 % (ref 17.9–39.5)
TIBC: 303 ug/dL (ref 250–450)
UIBC: 241 ug/dL

## 2019-02-13 LAB — GLUCOSE, CAPILLARY
Glucose-Capillary: 98 mg/dL (ref 70–99)
Glucose-Capillary: 91 mg/dL (ref 70–99)

## 2019-02-13 LAB — FERRITIN: Ferritin: 25 ng/mL (ref 24–336)

## 2019-02-13 LAB — DIFFERENTIAL
Abs Immature Granulocytes: 0.08 10*3/uL — ABNORMAL HIGH (ref 0.00–0.07)
Basophils Absolute: 0 10*3/uL (ref 0.0–0.1)
Basophils Relative: 0 %
Eosinophils Absolute: 0 10*3/uL (ref 0.0–0.5)
Eosinophils Relative: 0 %
Immature Granulocytes: 2 %
Lymphocytes Relative: 18 %
Lymphs Abs: 1 10*3/uL (ref 0.7–4.0)
Monocytes Absolute: 0.9 10*3/uL (ref 0.1–1.0)
Monocytes Relative: 16 %
Neutro Abs: 3.5 10*3/uL (ref 1.7–7.7)
Neutrophils Relative %: 64 %

## 2019-02-13 LAB — VITAMIN B12: Vitamin B-12: 1498 pg/mL — ABNORMAL HIGH (ref 180–914)

## 2019-02-13 LAB — PROCALCITONIN: Procalcitonin: 0.13 ng/mL

## 2019-02-13 LAB — MAGNESIUM: Magnesium: 2.2 mg/dL (ref 1.7–2.4)

## 2019-02-13 MED ORDER — PREDNISONE 10 MG PO TABS: 10.0000 mg | ORAL_TABLET | Freq: Every day | ORAL | 0 refills | Status: DC

## 2019-02-13 MED ORDER — PREDNISONE 10 MG PO TABS: ORAL_TABLET | ORAL | 0 refills | Status: DC

## 2019-02-13 MED ORDER — DOXYCYCLINE MONOHYDRATE 100 MG PO TABS: 100.0000 mg | ORAL_TABLET | Freq: Two times a day (BID) | ORAL | 0 refills | Status: AC

## 2019-02-13 MED ORDER — GUAIFENESIN ER 600 MG PO TB12: 600.0000 mg | ORAL_TABLET | Freq: Two times a day (BID) | ORAL | 0 refills | Status: DC

## 2019-02-13 NOTE — Discharge Summary (Signed)
Discharge Summary  DEVONTA BLANFORD KNL:[id number] DOB: Feb 20, 1941  PCP: Jani Gravel, MD  Admit date: 02/11/2019 Discharge date: 02/14/2019  Time spent: 78mins. Plan of care discussed with daughter over the phone with patient's permission  Recommendations for Outpatient Follow-up:  1. F/u with PCP within a week  for hospital discharge follow up, repeat cbc/bmp at follow up 2. F/u with pulmonology Dr Vaughan Browner for frequent copd flares and right middle lobe nodule 3. Continue home o2, he reports use is as needed, he is aware to keep o2 above 88%  Discharge Diagnoses:  Active Hospital Problems   Diagnosis Date Noted   Acute exacerbation of chronic obstructive pulmonary disease (COPD) ([place]) 02/11/2019   COPD with acute exacerbation ([place]) 02/08/2019   Acute on chronic respiratory failure with hypoxia (H[facility]) 12/25/2018   Chronic respiratory failure with hypoxia (HCC) 12/13/2018   OSA (obstructive sleep apnea) 06/14/2017   Essential hypertension 04/11/2016   COPD (chronic obstructive pulmonary disease) ([place]) 01/11/2016   B12 deficiency 11/03/2013   Normochromic anemia 01/29/2013   Diabetes mellitus ([place]) 01/10/2012    Resolved Hospital Problems  No resolved problems to display.    Discharge Condition: stable  Diet recommendation: heart healthy  Filed Weights   02/11/19 0838 02/11/19 1458  Weight: 95.3 kg 96.8 kg    History of present illness: ( per admitting Md Dr Alfredia Ferguson) PCP: Jani Gravel, MD   Patient coming from: Home  Chief Complaint: Worsening SOB this AM   HPI: Cory Kemp is a 78 y.o. male with medical history significant for but not limited too history of alcohol abuse, anemia, B12 deficiency, BPH, COPD, diabetes mellitus type 2, hyperlipidemia, hypertension, history of leukopenia and questionable dementia along with other comorbidities who presents with a chief complaint of worsening shortness of breath this morning.  Patient was recently discharged from the  hospital for acute on chronic COPD exacerbation discharged on 02/09/2019.  He went home he started feeling okay but this morning he significantly worsened and it was difficult for him to catch his breath.  He tried to rest and is still short of breath and EMS was called.  When EMS arrived patient was saturating 50% on 3 L and so they placed him on a nonrebreather and gave him IV Solu-Medrol, and IM epinephrine injection, as well as IV magnesium sulfate.  Patient denies chest pain, lightheadedness or dizziness but does feel weak and states that his shortness breath was fairly significant today.  States that he is coughing up some clear sputum which has not worsened from baseline.  No other concerns or complaints at this time.  TRH was called to admit this patient for an acute on chronic respiratory failure with hypoxia as well as acute exacerbation of COPD  ED Course: In the ED patient had basic blood work done as well as a chest x-ray. CTA of the chest was also done and still pending.  SARS-CoV-2 testing was negative.  Hospital Course:  Principal Problem:   Acute exacerbation of chronic obstructive pulmonary disease (COPD) (HCC) Active Problems:   Diabetes mellitus (HCC)   Normochromic anemia   B12 deficiency   COPD (chronic obstructive pulmonary disease) (HCC)   Essential hypertension   OSA (obstructive sleep apnea)   Chronic respiratory failure with hypoxia (HCC)   Acute on chronic respiratory failure with hypoxia (HCC)   COPD with acute exacerbation (HCC)    COPD exacerbation, acute on chronic hypoxic hypoxic respiratory failure, home o2 dependent -per EMS "When EMS arrived patient  was saturating 50% on 3 L and so they placed him on a nonrebreather and gave him IV Solu-Medrol, and IM epinephrine injection, as well as IV magnesium sulfate" -CTA on presentation negative for PE,  " Moderate to severe emphysema with bullous changes of the right lung base. Bibasilar streaky and subsegmental  densities may represent atelectasis/scarring versus residual or recurrent pneumonia. Clinical correlation is recommended. Overall interval improvement of the aeration of the left lower lobe with decrease in the size of consolidative area since the prior CT" -1 of 2 blood cx with coag negative staph (methicillin resistant), likely contaminant, mrsa screening negative, SARS Cov2 screening negative -He received iv steroids, doxycycline and nebs, - he is much improved, lung exam clear, no wheezing, with good aeration, repeat cxr no acute infiltrate, 02 sats at 91% while ambulation on room air, he desires to go home.  -he is discharged home with prednisone taper, doxycycline, mucinex, continue home meds trelegy , prn albuterol -it appears that this is patient's third hospitalization for copd exacerbation this year, he is advised to f/u with pulmonology in one week for copd management   9 mm focal nodularity in R middle lobe: possible scarring, follow outpatient F/u with pulmonology   HLD -Continue with simvastatin 40 mg p.o. nightly  HTN -Continue with losartan 50 mg p.o. nightly -improved BP, continue to monitor  Depression and Anxiety -Continue with escitalopram 5 mg p.o. daily  Procedures:  none  Consultations:  none  Discharge Exam: BP (!) 165/54 (BP Location: Left Arm)    Pulse 75    Temp 98 F (36.7 C) (Oral)    Resp 16    Ht 6\' 1"  (1.854 m)    Wt 96.8 kg    SpO2 94%    BMI 28.16 kg/m   General: NAD Cardiovascular: RRR Respiratory: CTABL  Discharge Instructions You were cared for by a hospitalist during your hospital stay. If you have any questions about your discharge medications or the care you received while you were in the hospital after you are discharged, you can call the unit and asked to speak with the hospitalist on call if the hospitalist that took care of you is not available. Once you are discharged, your primary care physician will handle any further medical  issues. Please note that NO REFILLS for any discharge medications will be authorized once you are discharged, as it is imperative that you return to your primary care physician (or establish a relationship with a primary care physician if you do not have one) for your aftercare needs so that they can reassess your need for medications and monitor your lab values.  Discharge Instructions    AMB Referral to Heath Management   Complete by: As directed    Please refer to community RN for complex care management services. Patient gives permission to speak with daughter, [employer], [phone]. Patient verifies his phone number is [phone].  Netta Cedars, MSN, RN [facility] Liaison Nurse Mobile Phone [phone]  Toll free office [phone]   Reason for consult: Post hospital follow up   Diagnoses of:  COPD/ Pneumonia Diabetes     Expected date of contact: 1-3 days (reserved for hospital discharges)   Diet - low sodium heart healthy   Complete by: As directed    Increase activity slowly   Complete by: As directed      Allergies as of 02/13/2019      Reactions   Adhesive [tape] Rash   No "PLASTIC"  tape!!! Patient broke out in blisters!!      Medication List    STOP taking these medications   Combivent Respimat 20-100 MCG/ACT Aers respimat Generic drug: Ipratropium-Albuterol     TAKE these medications   Afrin NoDrip Severe Congest 0.05 % nasal spray Generic drug: oxymetazoline Place 1 spray into both nostrils every 3 (three) hours.   albuterol (2.5 MG/3ML) 0.083% nebulizer solution Commonly known as: PROVENTIL Take 3 mLs (2.5 mg total) by nebulization every 6 (six) hours as needed for wheezing or shortness of breath.   albuterol 108 (90 Base) MCG/ACT inhaler Commonly known as: VENTOLIN HFA Inhale 2 puffs into the lungs every 4 (four) hours as needed for wheezing or shortness of breath.   cyclobenzaprine 10 MG tablet Commonly known as: FLEXERIL Take 1  tablet (10 mg total) by mouth 2 (two) times daily as needed for muscle spasms.   diclofenac sodium 1 % Gel Commonly known as: VOLTAREN Apply 4 g topically 4 (four) times daily. What changed:   when to take this  reasons to take this   doxycycline 100 MG tablet Commonly known as: ADOXA Take 1 tablet (100 mg total) by mouth 2 (two) times daily for 3 days.   escitalopram 5 MG tablet Commonly known as: LEXAPRO Take 5 mg by mouth daily. Notes to patient: 7/9   Fluticasone-Umeclidin-Vilant 100-62.5-25 MCG/INH Aepb Commonly known as: Trelegy Ellipta Inhale 1 puff into the lungs daily. Notes to patient: 7/9   guaiFENesin 600 MG 12 hr tablet Commonly known as: MUCINEX Take 1 tablet (600 mg total) by mouth 2 (two) times daily.   ibuprofen 200 MG tablet Commonly known as: ADVIL Take 400 mg by mouth at bedtime.   losartan 50 MG tablet Commonly known as: COZAAR Take 50 mg by mouth at bedtime.   OXYGEN Inhale 4 L into the lungs continuous.   predniSONE 10 MG tablet Commonly known as: DELTASONE Take 30mg  (three tabs) on day one , then 20mg  ( two tabs) on day two, then 10mg  (one tab) on day three, then stop. What changed:   medication strength  how much to take  how to take this  when to take this  additional instructions   simvastatin 40 MG tablet Commonly known as: ZOCOR Take 40 mg by mouth at bedtime.   vitamin B-12 100 MCG tablet Commonly known as: CYANOCOBALAMIN Take 100 mcg by mouth at bedtime.   Vitamin D-3 25 MCG (1000 UT) Caps Take 1,000 Units by mouth at bedtime.      Allergies  Allergen Reactions   Adhesive [Tape] Rash    No "PLASTIC" tape!!! Patient broke out in blisters!!   Follow-up Information    Jani Gravel, MD Follow up.   Specialty: Internal Medicine Why: hospital discharge follow up Contact information: [place] [address] [phone]        [name], MD. Call in 2 week(s).   Specialty:  Pulmonary Disease Why: copd management  Contact information: [place] [address] [phone]            The results of significant diagnostics from this hospitalization (including imaging, microbiology, ancillary and laboratory) are listed below for reference.    Significant Diagnostic Studies: Dg Chest 2 View  Result Date: 02/13/2019 CLINICAL DATA:  Shortness of breath and cough today. History of COPD and hypertension. EXAM: CHEST - 2 VIEW COMPARISON:  Radiographs and CT 02/11/2019. Older studies are also correlated. FINDINGS: The heart  size and mediastinal contours are stable with aortic atherosclerosis. The lungs are hyperinflated with stable streaky opacities at both lung bases, likely postinflammatory scarring or atelectasis as correlated with recent CT. There is no new airspace disease, significant pleural effusion or pneumothorax. The bones appear unchanged. IMPRESSION: No change from recent prior studies. Emphysema with probable bibasilar atelectasis or scarring. Electronically Signed   By: Richardean Sale M.D.   On: 02/13/2019 09:38   Dg Chest 2 View  Result Date: 02/08/2019 CLINICAL DATA:  Shortness of breath EXAM: CHEST - 2 VIEW COMPARISON:  02/08/2019, CT 12/25/2018, radiograph 12/24/2018, 01/03/2018 FINDINGS: Hyperinflation with emphysematous disease. Streaky atelectasis at the left base. Bronchitic changes and scarring at both bases. No pleural effusion. No focal consolidation. Stable cardiomediastinal silhouette with aortic atherosclerosis. No pneumothorax IMPRESSION: 1. Hyperinflation with emphysematous disease. 2. Subsegmental atelectasis left base. Bronchitic changes and scarring at both bases Electronically Signed   By: Donavan Foil M.D.   On: 02/08/2019 23:12   Ct Angio Chest Pe W And/or Wo Contrast  Result Date: 02/11/2019 CLINICAL DATA:  78 year old male with hypoxemia and respiratory failure. A stent hospitalization for COPD exacerbation.  EXAM: CT ANGIOGRAPHY CHEST WITH CONTRAST TECHNIQUE: Multidetector CT imaging of the chest was performed using the standard protocol during bolus administration of intravenous contrast. Multiplanar CT image reconstructions and MIPs were obtained to evaluate the vascular anatomy. CONTRAST:  118mL OMNIPAQUE IOHEXOL 350 MG/ML SOLN COMPARISON:  Chest radiograph dated 02/11/2019 and CT dated 12/25/2018 FINDINGS: Evaluation of this exam is limited due to respiratory motion artifact. Cardiovascular: There is no cardiomegaly or pericardial effusion. Multi vessel coronary vascular calcification. There is moderate atherosclerotic calcification of the thoracic aorta. No aneurysmal dilatation or evidence of dissection. The origins of the great vessels of the aortic arch appear patent as visualized. Evaluation of the pulmonary arteries is limited due to respiratory motion artifact as well as suboptimal opacification and timing of the contrast. No definite large or central pulmonary artery embolus identified. Mediastinum/Nodes: There is no hilar or mediastinal adenopathy. The esophagus and the thyroid gland are grossly unremarkable. No mediastinal fluid collection. Lungs/Pleura: Moderate to severe emphysema with bullous changes of the right lung base. Bibasilar streaky and subsegmental densities noted. Overall interval decrease in the consolidative changes of the left lower lobe. This may represent atelectatic changes/scarring, although residual or recurrent pneumonia is not excluded. Clinical correlation is recommended. There is no pleural effusion or pneumothorax. A 9 mm focal nodularity in the right middle lobe (series 11, image 136) appears similar to prior CT and may represent an area of scarring. Attention on follow-up imaging recommended. The central airways are patent. Upper Abdomen: A most consistent with a cyst. 4.7 cm hypodense lesion from the upper pole of the right kidney Musculoskeletal: Degenerative changes of the  spine. No acute osseous pathology. Review of the MIP images confirms the above findings. IMPRESSION: 1. No definite large or central pulmonary artery embolus on limited study. 2. Moderate to severe emphysema with bullous changes of the right lung base. 3. Bibasilar streaky and subsegmental densities may represent atelectasis/scarring versus residual or recurrent pneumonia. Clinical correlation is recommended. Overall interval improvement of the aeration of the left lower lobe with decrease in the size of consolidative area since the prior CT 4. A 9 mm focal nodularity in the right middle lobe appears similar to prior CT and may represent scarring. Attention on follow-up imaging recommended. 5. Aortic Atherosclerosis (ICD10-I70.0) and Emphysema (ICD10-J43.9). Electronically Signed   By: Laren Everts.D.  On: 02/11/2019 12:41   Dg Chest Portable 1 View  Result Date: 02/11/2019 CLINICAL DATA:  78 year old with shortness of breath.  Hypoxia. EXAM: PORTABLE CHEST 1 VIEW COMPARISON:  02/08/2027 and on 12/24/2018.  Chest CT 12/25/2018 FINDINGS: Again noted is hyperinflation. Few densities at the left lung base are suggestive for atelectasis. Right costophrenic angle is incompletely imaged on this examination. Heart size is upper limits of normal but stable. Atherosclerotic calcifications at the aortic arch. No focal airspace disease or pulmonary edema. Patchy densities at the right lung base appear chronic. IMPRESSION: 1. Patchy densities at the lung bases are suggestive for atelectasis and/or scarring. 2. Hyperinflation and emphysema. Electronically Signed   By: Markus Daft M.D.   On: 02/11/2019 09:06   Dg Chest Portable 1 View  Result Date: 02/08/2019 CLINICAL DATA:  Shortness of breath. EXAM: PORTABLE CHEST 1 VIEW COMPARISON:  Radiographs of Dec 24, 2018. FINDINGS: Stable cardiomegaly. Atherosclerosis of thoracic aorta is noted. No pneumothorax is noted. Lung bases are not completely visualized on this study.  No definite pulmonary abnormality is noted within the visualized lung fields. Bony thorax is unremarkable. IMPRESSION: Limited exam as lung bases are not completely visualized. No definite acute abnormality is noted. Aortic Atherosclerosis (ICD10-I70.0). Electronically Signed   By: Marijo Conception M.D.   On: 02/08/2019 18:14    Microbiology: Recent Results (from the past 240 hour(s))  SARS Coronavirus 2 (CEPHEID - Performed in [facility] hospital lab), Hosp Order     Status: None   Collection Time: 02/08/19  5:35 PM   Specimen: Nasopharyngeal Swab  Result Value Ref Range Status   SARS Coronavirus 2 NEGATIVE NEGATIVE Final    Comment: (NOTE) If result is NEGATIVE SARS-CoV-2 target nucleic acids are NOT DETECTED. The SARS-CoV-2 RNA is generally detectable in upper and lower  respiratory specimens during the acute phase of infection. The lowest  concentration of SARS-CoV-2 viral copies this assay can detect is 250  copies / mL. A negative result does not preclude SARS-CoV-2 infection  and should not be used as the sole basis for treatment or other  patient management decisions.  A negative result may occur with  improper specimen collection / handling, submission of specimen other  than nasopharyngeal swab, presence of viral mutation(s) within the  areas targeted by this assay, and inadequate number of viral copies  (<250 copies / mL). A negative result must be combined with clinical  observations, patient history, and epidemiological information. If result is POSITIVE SARS-CoV-2 target nucleic acids are DETECTED. The SARS-CoV-2 RNA is generally detectable in upper and lower  respiratory specimens dur ing the acute phase of infection.  Positive  results are indicative of active infection with SARS-CoV-2.  Clinical  correlation with patient history and other diagnostic information is  necessary to determine patient infection status.  Positive results do  not rule out bacterial infection  or co-infection with other viruses. If result is PRESUMPTIVE POSTIVE SARS-CoV-2 nucleic acids MAY BE PRESENT.   A presumptive positive result was obtained on the submitted specimen  and confirmed on repeat testing.  While 2019 novel coronavirus  (SARS-CoV-2) nucleic acids may be present in the submitted sample  additional confirmatory testing may be necessary for epidemiological  and / or clinical management purposes  to differentiate between  SARS-CoV-2 and other Sarbecovirus currently known to infect humans.  If clinically indicated additional testing with an alternate test  methodology [phone]) is advised. The SARS-CoV-2 RNA is generally  detectable in upper and  lower respiratory sp ecimens during the acute  phase of infection. The expected result is Negative. Fact Sheet for Patients:  StrictlyIdeas.no Fact Sheet for Healthcare Providers: BankingDealers.co.za This test is not yet approved or cleared by the [place] FDA and has been authorized for detection and/or diagnosis of SARS-CoV-2 by FDA under an Emergency Use Authorization (EUA).  This EUA will remain in effect (meaning this test can be used) for the duration of the COVID-19 declaration under Section 564(b)(1) of the Act, 21 U.S.C. section 360bbb-3(b)(1), unless the authorization is terminated or revoked sooner. Performed at [facility], [place] [address], [place], [place] [address]   Blood culture (routine x 2)     Status: Abnormal   Collection Time: 02/11/19  8:50 AM   Specimen: BLOOD LEFT HAND  Result Value Ref Range Status   Specimen Description   Final    BLOOD LEFT HAND Performed at [facility] [address] [place], [place] [address]    Special Requests   Final    BOTTLES DRAWN AEROBIC AND ANAEROBIC Blood Culture results may not be optimal due to an excessive volume of blood received in culture bottles Performed at [facility] [address], [place], [place] [address]    Culture  Setup Time   Final    GRAM POSITIVE COCCI IN CLUSTERS AEROBIC BOTTLE ONLY CRITICAL RESULT CALLED TO, READ BACK BY AND VERIFIED WITH: Karel Jarvis PharmD 9:40 02/12/19 ([email or web address])    Culture (A)  Final    STAPHYLOCOCCUS SPECIES (COAGULASE NEGATIVE) THE SIGNIFICANCE OF ISOLATING THIS ORGANISM FROM A SINGLE SET OF BLOOD CULTURES WHEN MULTIPLE SETS ARE DRAWN IS UNCERTAIN. PLEASE NOTIFY THE MICROBIOLOGY DEPARTMENT WITHIN ONE WEEK IF SPECIATION AND SENSITIVITIES ARE REQUIRED. Performed at [facility], [place] [address], [place], [place] [address]    Report Status 02/13/2019 FINAL  Final  Blood Culture ID Panel (Reflexed)     Status: Abnormal   Collection Time: 02/11/19  8:50 AM  Result Value Ref Range Status   Enterococcus species NOT DETECTED NOT DETECTED Final   Listeria monocytogenes NOT DETECTED NOT DETECTED Final   Staphylococcus species DETECTED (A) NOT DETECTED Final    Comment: Methicillin (oxacillin) resistant coagulase negative staphylococcus. Possible blood culture contaminant (unless isolated from more than one blood culture draw or clinical case suggests pathogenicity). No antibiotic treatment is indicated for blood  culture contaminants. CRITICAL RESULT CALLED TO, READ BACK BY AND VERIFIED WITH: Karel Jarvis PharmD 9:40 02/12/19 ([email or web address])    Staphylococcus aureus (BCID) NOT DETECTED NOT DETECTED Final   Methicillin resistance DETECTED (A) NOT DETECTED Final    Comment: CRITICAL RESULT CALLED TO, READ BACK BY AND VERIFIED WITH: Karel Jarvis PharmD 9:40 02/12/19 ([email or web address])    Streptococcus species NOT DETECTED NOT DETECTED Final   Streptococcus agalactiae NOT DETECTED NOT DETECTED Final   Streptococcus pneumoniae NOT DETECTED NOT DETECTED Final   Streptococcus pyogenes NOT DETECTED NOT DETECTED Final   Acinetobacter baumannii NOT DETECTED NOT DETECTED Final   Enterobacteriaceae species NOT DETECTED NOT DETECTED Final     Enterobacter cloacae complex NOT DETECTED NOT DETECTED Final   Escherichia coli NOT DETECTED NOT DETECTED Final   Klebsiella oxytoca NOT DETECTED NOT DETECTED Final   Klebsiella pneumoniae NOT DETECTED NOT DETECTED Final   Proteus species NOT DETECTED NOT DETECTED Final   Serratia marcescens NOT DETECTED NOT DETECTED Final   Haemophilus influenzae NOT DETECTED NOT DETECTED Final   Neisseria meningitidis NOT DETECTED NOT DETECTED  Final   Pseudomonas aeruginosa NOT DETECTED NOT DETECTED Final   Candida albicans NOT DETECTED NOT DETECTED Final   Candida glabrata NOT DETECTED NOT DETECTED Final   Candida krusei NOT DETECTED NOT DETECTED Final   Candida parapsilosis NOT DETECTED NOT DETECTED Final   Candida tropicalis NOT DETECTED NOT DETECTED Final    Comment: Performed at [facility], [place] [address], [place], [place] [address]  SARS Coronavirus 2 (CEPHEID- Performed in [facility] hospital lab), Hosp Order     Status: None   Collection Time: 02/11/19  8:59 AM   Specimen: Nasopharyngeal Swab  Result Value Ref Range Status   SARS Coronavirus 2 NEGATIVE NEGATIVE Final    Comment: (NOTE) If result is NEGATIVE SARS-CoV-2 target nucleic acids are NOT DETECTED. The SARS-CoV-2 RNA is generally detectable in upper and lower  respiratory specimens during the acute phase of infection. The lowest  concentration of SARS-CoV-2 viral copies this assay can detect is 250  copies / mL. A negative result does not preclude SARS-CoV-2 infection  and should not be used as the sole basis for treatment or other  patient management decisions.  A negative result may occur with  improper specimen collection / handling, submission of specimen other  than nasopharyngeal swab, presence of viral mutation(s) within the  areas targeted by this assay, and inadequate number of viral copies  (<250 copies / mL). A negative result must be combined with clinical  observations, patient history, and epidemiological  information. If result is POSITIVE SARS-CoV-2 target nucleic acids are DETECTED. The SARS-CoV-2 RNA is generally detectable in upper and lower  respiratory specimens dur ing the acute phase of infection.  Positive  results are indicative of active infection with SARS-CoV-2.  Clinical  correlation with patient history and other diagnostic information is  necessary to determine patient infection status.  Positive results do  not rule out bacterial infection or co-infection with other viruses. If result is PRESUMPTIVE POSTIVE SARS-CoV-2 nucleic acids MAY BE PRESENT.   A presumptive positive result was obtained on the submitted specimen  and confirmed on repeat testing.  While 2019 novel coronavirus  (SARS-CoV-2) nucleic acids may be present in the submitted sample  additional confirmatory testing may be necessary for epidemiological  and / or clinical management purposes  to differentiate between  SARS-CoV-2 and other Sarbecovirus currently known to infect humans.  If clinically indicated additional testing with an alternate test  methodology [phone]) is advised. The SARS-CoV-2 RNA is generally  detectable in upper and lower respiratory sp ecimens during the acute  phase of infection. The expected result is Negative. Fact Sheet for Patients:  StrictlyIdeas.no Fact Sheet for Healthcare Providers: BankingDealers.co.za This test is not yet approved or cleared by the [place] FDA and has been authorized for detection and/or diagnosis of SARS-CoV-2 by FDA under an Emergency Use Authorization (EUA).  This EUA will remain in effect (meaning this test can be used) for the duration of the COVID-19 declaration under Section 564(b)(1) of the Act, 21 U.S.C. section 360bbb-3(b)(1), unless the authorization is terminated or revoked sooner. Performed at [facility], [place] [address], [place], [place] [address]   Blood culture  (routine x 2)     Status: None (Preliminary result)   Collection Time: 02/11/19  9:00 AM   Specimen: BLOOD  Result Value Ref Range Status   Specimen Description   Final    BLOOD RIGHT ANTECUBITAL Performed at [place] [address] [place],  [place] [address]    Special Requests   Final    BOTTLES DRAWN AEROBIC AND ANAEROBIC Blood Culture adequate volume Performed at [place] [address], [place], [place] [address]    Culture   Final    NO GROWTH 3 DAYS Performed at [facility], [place] [address] [place], [place] [address]    Report Status PENDING  Incomplete  Urine culture     Status: None   Collection Time: 02/11/19 12:50 PM   Specimen: Urine, Random  Result Value Ref Range Status   Specimen Description   Final    URINE, RANDOM Performed at [place] [address], [place], [place] [address]    Special Requests   Final    NONE Performed at [facility], [place] [address], [place], [place] [address]    Culture   Final    NO GROWTH Performed at [facility], [place] [address] [place], [place] [address]    Report Status 02/12/2019 FINAL  Final  MRSA PCR Screening     Status: None   Collection Time: 02/11/19  1:31 PM   Specimen: Nasopharyngeal  Result Value Ref Range Status   MRSA by PCR NEGATIVE NEGATIVE Final    Comment:        The GeneXpert MRSA Assay (FDA approved for NASAL specimens only), is one component of a comprehensive MRSA colonization surveillance program. It is not intended to diagnose MRSA infection nor to guide or monitor treatment for MRSA infections. Performed at [facility], [place] [address], [place], [place] [address]      Labs: Basic Metabolic Panel: Recent Labs  Lab 02/08/19 1737 02/08/19 1819 02/09/19 0443 02/11/19 0859 02/12/19 0828 02/13/19 0603  NA 141 142 138 142 139 139  K 4.3 4.2 4.3 4.4 4.2 4.0  CL 105  --   105 105 103 104  CO2 26  --  26 27 24 27   GLUCOSE 127*  --  232* 206* 150* 95  BUN 15  --  18 27* 23 25*  CREATININE 0.96  --  0.87 0.93 0.75 0.73  CALCIUM 9.0  --  8.7* 8.7* 8.7* 8.9  MG  --   --   --   --  2.1 2.2   Liver Function Tests: Recent Labs  Lab 02/08/19 1737 02/11/19 0859 02/12/19 0828 02/13/19 0603  AST 22 25 19 20   ALT 18 27 25 24   ALKPHOS 82 69 61 54  BILITOT 0.6 0.4 0.2* 0.4  PROT 7.7 7.2 6.6 6.2*  ALBUMIN 3.9 3.9 3.6 3.4*   No results for input(s): LIPASE, AMYLASE in the last 168 hours. No results for input(s): AMMONIA in the last 168 hours. CBC: Recent Labs  Lab 02/08/19 1737 02/08/19 1819 02/09/19 0443 02/11/19 0859 02/12/19 0828 02/13/19 0603  WBC 5.0  --  1.3* 4.0 2.9* 5.4  NEUTROABS 1.4*  --   --  2.6  --  3.5  HGB 13.3 13.9 11.7* 12.6* 11.6* 12.0*  HCT 42.2 41.0 36.1* 41.0 36.7* 38.8*  MCV 95.7  --  92.8 98.6 95.3 95.3  PLT 212  --  179 192 175 169   Cardiac Enzymes: No results for input(s): CKTOTAL, CKMB, CKMBINDEX, TROPONINI in the last 168 hours. BNP: BNP (last 3 results) Recent Labs    12/24/18 2344  BNP 47.6    ProBNP (last 3 results) No results for input(s): PROBNP in the last 8760 hours.  CBG:  Recent Labs  Lab 02/12/19 1129 02/12/19 1702 02/12/19 2202 02/13/19 0817 02/13/19 1202  GLUCAP 155* 143* 143* 98 91       Signed:  Florencia Reasons MD, PhD  [facility] 02/14/2019, 9:17 PM

## 2019-02-13 NOTE — Progress Notes (Signed)
Pt discharged from the unit via wheelchair. Discharge instructions were reviewed with pt . No questions or concerns at this time.

## 2019-02-13 NOTE — Consult Note (Signed)
   [facility] CM Inpatient Consult   02/13/2019  EMIN FOREE 1940-12-05 [id number]  Patient chart has been reviewed for readmissions less than 30 days and for medium risk score, 18%, for unplanned readmissions.  Patient assessed for community [place] Management follow up needs.    Spoke with Mr. Villella by telephone. HIPAA verified. Explained [facility] CM services to patient and assessed for community needs. Patient states that he rents a room in a friend's home. He states that he would welcome community follow up for assistance with meals and transportation and follow up with discharge instructions.   Mr. Galik gives permission to speak with his daughter, [name], regarding community follow up needs. Patient gives verbal consent to engage with [facility] CM when transitions home.  Referral to [place] RN has been placed. Of note, [facility] Care Management services does not replace or interfere with any services that are arranged by inpatient case management or social work.  Netta Cedars, MSN, [facility] Liaison Nurse Mobile Phone [phone]  Toll free office [phone]

## 2019-02-14 ENCOUNTER — Other Ambulatory Visit: Payer: Self-pay | Admitting: *Deleted

## 2019-02-14 NOTE — Patient Outreach (Signed)
[facility]) Care Management  02/14/2019  MORTON SIMSON Mar 29, 1941 [id number]   Referral received from hospital liaison as member was recently discharged after being admitted with complications of COPD.  He's had 3 admissions and 2 ED visits within the past 6 months.  Primary MD office will complete transition of care assessment.  Per chart, he also has history of hypertension, OSA, and diabetes (A1C - 5.7).  Call placed to member, identity verified.  This care manager introduced self and stated purpose of call.  He voices interest in program but request his daughter be the point of contact.  Call them placed to daughter, no answer. HIPAA compliant voice message left, will follow up within the next 3-4 business days.  [name], [place], MSN [place] [phone]

## 2019-02-16 LAB — CULTURE, BLOOD (ROUTINE X 2)
Culture: NO GROWTH
Special Requests: ADEQUATE

## 2019-02-17 DIAGNOSIS — J441 Chronic obstructive pulmonary disease with (acute) exacerbation: Secondary | ICD-10-CM | POA: Diagnosis not present

## 2019-02-18 ENCOUNTER — Other Ambulatory Visit: Payer: Self-pay | Admitting: *Deleted

## 2019-02-18 NOTE — Patient Outreach (Signed)
[facility]) Care Management  02/18/2019  Cory Kemp Oct 10, 1940 [id number]   Successful attempt made to daughter for telephone assessment of member's needs.  Member's identity verified.  This care manager introduced self and stated purpose of call.  [facility] care management services explained.  She report member owns a trailer and has a friend that rent a room from him.  He remains mostly independent however she has concerns regarding his ability to continue to care for himself as well as the safety of the home.  She state that his home is unkept and he has reported not having hot water.  State he is a very independent person and "doesn't like to ask for help."    She is concerned with member's ability to bathe and manage his own medications as well as his recurrent issues with managing his COPD.  Report he does wear his oxygen and monitor his oxygen levels daily but continue to have trouble breathing.  She feel these issues may by exacerbated by built up dirt/dust and possibility of bugs in the home.  State when asked about his ability to care for himself and overall management he state he is doing well.  He continues to drive but daughter also provide transportation to MD appointments when needed and to stay informed of plan of care.  This care manager inquired about increased support in the home such as in home aide assistance and/or assisted living facility.  She is unsure if he will be able to afford either of them but is willing to help him apply for Medicaid.  She report he has been having trouble paying all of his bills.  [place] off due to the Covid crisis.  She has since helped him apply for unemployment but finances continue to be a concern.    Member does not have appointment with primary MD scheduled as of yet, daughter will call.  Appointment with pulmonary office tomorrow.  She will discuss concerns with MD at that time.    Will  place referral to social worker for financial resources as well as possible application for Medicaid.  Will follow up with daughter within the next week.  Will follow up with member directly to complete assessment.  Cory Kemp, [place], MSN [place] [phone]

## 2019-02-18 NOTE — Progress Notes (Signed)
@Patient  ID: Cory Kemp, male    DOB: 03-Nov-1940, 78 y.o.   MRN: [id number]  Chief Complaint  Patient presents with   Follow-up    Patrient reports incresed sob in the past 5 weeks with and without exertion. He also reports a productive cough with clear to yellow sputum. He reports using his rescue inhaler/nebulizer twice daily.    Referring provider: Jani Gravel, MD  HPI:  78 year old former smoker followed in our office for COPD and OSA   PMH: DMT2, HTN  Smoker/ Smoking History: Former Smoker  Maintenance:  Trelegy Ellipta  Pt of: Dr. Vaughan Browner  02/19/2019  - Visit   78 year old male former smoker followed in our office for COPD.  Patient reports he is having worsening and increased shortness of breath for the last 5 weeks.  He is having shortness of breath with and without exertion.  Is also having a productive cough with clear to yellow sputum.  He is using his rescue inhaler or nebulizer twice daily.  Patient continues to struggle with inhaler regimen.  Patient last worked 4 weeks ago.  He has not been able to work due to his recurrent COPD exacerbations.  Daughter who is present with patient today reports that she is in the process of working with a Education officer, museum to help patient with getting qualified for Medicaid.  Patient is also willing to work with home health if you are interested in referring.  Patient also has continue to work with [facility] telephonically for COPD management.  MMRC - Breathlessness Score 4 - I am too breathless to leave the house or I am breathlessness when dressing      Tests:   CT chest 09/12/14- Emphysematous changes noted bilaterally. Minimal posterior basilar scarring or subsegmental atelectasis is noted. No pulmonary mass or nodule is noted  CT angiogram 04/10/16- emphysema, chronic bronchitis.  Subsegmental atelectasis at the bases mildly increased compared to previous  Chest x-ray 07/02/17-hyperinflated lungs.  No acute  abnormality.  12/25/2018-CTA chest- no evidence of acute PE, emphysema with left greater than right lower lobe pneumonia, small layering left pleural effusion, mild reactive hilar lymph nodes  02/11/2019-CTA chest- no definite large central PE, moderate to severe emphysema with bullous changes of the right lung base, 9 mm focal nodularity right middle lobe appear similar to prior CT and may represent scarring  PFTs 01/21/16 FVC 2.44 (71%), FEV1 1.47 [41%), F/F 43, TLC 122%, RV/TLC 166%, DLCO 48% Severe obstruction with reduction in diffusion capacity. Hyperinflation with air trapping.  Home sleep test 11/14/16 Moderate OSA with desats.  In lab sleep study 12/16/2017 AHI 0.8, nocturnal desaturations.  FENO:  No results found for: NITRICOXIDE  PFT: PFT Results Latest Ref Rng & Units 01/21/2016  FVC-Pre L 3.44  FVC-Predicted Pre % 71  FVC-Post L 3.65  FVC-Predicted Post % 75  Pre FEV1/FVC % % 43  Post FEV1/FCV % % 45  FEV1-Pre L 1.47  FEV1-Predicted Pre % 41  FEV1-Post L 1.66  DLCO UNC% % 48  DLCO COR %Predicted % 64  TLC L 9.57  TLC % Predicted % 123  RV % Predicted % 215    Imaging: Dg Chest 2 View  Result Date: 02/13/2019 CLINICAL DATA:  Shortness of breath and cough today. History of COPD and hypertension. EXAM: CHEST - 2 VIEW COMPARISON:  Radiographs and CT 02/11/2019. Older studies are also correlated. FINDINGS: The heart size and mediastinal contours are stable with aortic atherosclerosis. The lungs are  hyperinflated with stable streaky opacities at both lung bases, likely postinflammatory scarring or atelectasis as correlated with recent CT. There is no new airspace disease, significant pleural effusion or pneumothorax. The bones appear unchanged. IMPRESSION: No change from recent prior studies. Emphysema with probable bibasilar atelectasis or scarring. Electronically Signed   By: Richardean Sale M.D.   On: 02/13/2019 09:38   Dg Chest 2 View  Result Date: 02/08/2019 CLINICAL  DATA:  Shortness of breath EXAM: CHEST - 2 VIEW COMPARISON:  02/08/2019, CT 12/25/2018, radiograph 12/24/2018, 01/03/2018 FINDINGS: Hyperinflation with emphysematous disease. Streaky atelectasis at the left base. Bronchitic changes and scarring at both bases. No pleural effusion. No focal consolidation. Stable cardiomediastinal silhouette with aortic atherosclerosis. No pneumothorax IMPRESSION: 1. Hyperinflation with emphysematous disease. 2. Subsegmental atelectasis left base. Bronchitic changes and scarring at both bases Electronically Signed   By: Donavan Foil M.D.   On: 02/08/2019 23:12   Ct Angio Chest Pe W And/or Wo Contrast  Result Date: 02/11/2019 CLINICAL DATA:  78 year old male with hypoxemia and respiratory failure. A stent hospitalization for COPD exacerbation. EXAM: CT ANGIOGRAPHY CHEST WITH CONTRAST TECHNIQUE: Multidetector CT imaging of the chest was performed using the standard protocol during bolus administration of intravenous contrast. Multiplanar CT image reconstructions and MIPs were obtained to evaluate the vascular anatomy. CONTRAST:  154mL OMNIPAQUE IOHEXOL 350 MG/ML SOLN COMPARISON:  Chest radiograph dated 02/11/2019 and CT dated 12/25/2018 FINDINGS: Evaluation of this exam is limited due to respiratory motion artifact. Cardiovascular: There is no cardiomegaly or pericardial effusion. Multi vessel coronary vascular calcification. There is moderate atherosclerotic calcification of the thoracic aorta. No aneurysmal dilatation or evidence of dissection. The origins of the great vessels of the aortic arch appear patent as visualized. Evaluation of the pulmonary arteries is limited due to respiratory motion artifact as well as suboptimal opacification and timing of the contrast. No definite large or central pulmonary artery embolus identified. Mediastinum/Nodes: There is no hilar or mediastinal adenopathy. The esophagus and the thyroid gland are grossly unremarkable. No mediastinal fluid  collection. Lungs/Pleura: Moderate to severe emphysema with bullous changes of the right lung base. Bibasilar streaky and subsegmental densities noted. Overall interval decrease in the consolidative changes of the left lower lobe. This may represent atelectatic changes/scarring, although residual or recurrent pneumonia is not excluded. Clinical correlation is recommended. There is no pleural effusion or pneumothorax. A 9 mm focal nodularity in the right middle lobe (series 11, image 136) appears similar to prior CT and may represent an area of scarring. Attention on follow-up imaging recommended. The central airways are patent. Upper Abdomen: A most consistent with a cyst. 4.7 cm hypodense lesion from the upper pole of the right kidney Musculoskeletal: Degenerative changes of the spine. No acute osseous pathology. Review of the MIP images confirms the above findings. IMPRESSION: 1. No definite large or central pulmonary artery embolus on limited study. 2. Moderate to severe emphysema with bullous changes of the right lung base. 3. Bibasilar streaky and subsegmental densities may represent atelectasis/scarring versus residual or recurrent pneumonia. Clinical correlation is recommended. Overall interval improvement of the aeration of the left lower lobe with decrease in the size of consolidative area since the prior CT 4. A 9 mm focal nodularity in the right middle lobe appears similar to prior CT and may represent scarring. Attention on follow-up imaging recommended. 5. Aortic Atherosclerosis (ICD10-I70.0) and Emphysema (ICD10-J43.9). Electronically Signed   By: Anner Crete M.D.   On: 02/11/2019 12:41   Dg Chest Portable 1 View  Result Date: 02/11/2019 CLINICAL DATA:  78 year old with shortness of breath.  Hypoxia. EXAM: PORTABLE CHEST 1 VIEW COMPARISON:  02/08/2027 and on 12/24/2018.  Chest CT 12/25/2018 FINDINGS: Again noted is hyperinflation. Few densities at the left lung base are suggestive for  atelectasis. Right costophrenic angle is incompletely imaged on this examination. Heart size is upper limits of normal but stable. Atherosclerotic calcifications at the aortic arch. No focal airspace disease or pulmonary edema. Patchy densities at the right lung base appear chronic. IMPRESSION: 1. Patchy densities at the lung bases are suggestive for atelectasis and/or scarring. 2. Hyperinflation and emphysema. Electronically Signed   By: Markus Daft M.D.   On: 02/11/2019 09:06   Dg Chest Portable 1 View  Result Date: 02/08/2019 CLINICAL DATA:  Shortness of breath. EXAM: PORTABLE CHEST 1 VIEW COMPARISON:  Radiographs of Dec 24, 2018. FINDINGS: Stable cardiomegaly. Atherosclerosis of thoracic aorta is noted. No pneumothorax is noted. Lung bases are not completely visualized on this study. No definite pulmonary abnormality is noted within the visualized lung fields. Bony thorax is unremarkable. IMPRESSION: Limited exam as lung bases are not completely visualized. No definite acute abnormality is noted. Aortic Atherosclerosis (ICD10-I70.0). Electronically Signed   By: Marijo Conception M.D.   On: 02/08/2019 18:14      Specialty Problems      Pulmonary Problems   COPD (chronic obstructive pulmonary disease) (HCC)    PFTs 01/21/16 FVC 2.44 (71%), FEV1 1.47 [41%), F/F 43, TLC 122%, RV/TLC 166%, DLCO 48% Severe obstruction with reduction in diffusion capacity. Hyperinflation with air trapping.  CT chest 09/12/14- Emphysematous changes noted bilaterally. Minimal posterior basilar scarring or subsegmental atelectasis is noted. No pulmonary mass or nodule is noted       OSA (obstructive sleep apnea)   Chronic respiratory failure with hypoxia (HCC)   Acute on chronic respiratory failure with hypoxia (HCC)   CAP (community acquired pneumonia)   COPD with acute exacerbation ([place])   Acute exacerbation of chronic obstructive pulmonary disease (COPD) (HCC)      Allergies  Allergen Reactions   Adhesive  [Tape] Rash    No "PLASTIC" tape!!! Patient broke out in blisters!!    Immunization History  Administered Date(s) Administered   Influenza, High Dose Seasonal PF 05/21/2018   Pneumococcal Conjugate-13 02/02/2018    Past Medical History:  Diagnosis Date   Alcohol abuse    Anemia    Arthritis    Asthma    B12 deficiency 11/03/2013   BPH (benign prostatic hypertrophy)    COPD (chronic obstructive pulmonary disease) (HCC)    Dementia (HCC)    ?   Diabetes mellitus ([place]) 01/10/2012   Femoral hernia, right 03/27/2012   Glucose intolerance (impaired glucose tolerance)    Hyperlipidemia    Hypertension    Incisional hernias - swiss-cheese-type periumbilical 01/10/7845   Inguinal hernia - right 01/10/2012   Leukopenia 01/29/2013   Monocytosis 01/29/2013   Normochromic anemia 01/29/2013   Osteopenia     Tobacco History: Social History   Tobacco Use  Smoking Status Former Smoker   Packs/day: 1.00   Years: 63.00   Pack years: 63.00   Types: Cigarettes   Start date: 08/09/1955   Quit date: 08/08/2018   Years since quitting: 0.5  Smokeless Tobacco Never Used  Tobacco Comment   reports he stopped at the beginning of this year   Counseling given: Not Answered Comment: reports he stopped at the beginning of this year   Continue to not smoke  Outpatient  Encounter Medications as of 02/19/2019  Medication Sig   albuterol (PROVENTIL) (2.5 MG/3ML) 0.083% nebulizer solution Take 3 mLs (2.5 mg total) by nebulization every 6 (six) hours as needed for wheezing or shortness of breath.   albuterol (VENTOLIN HFA) 108 (90 Base) MCG/ACT inhaler Inhale 2 puffs into the lungs every 4 (four) hours as needed for wheezing or shortness of breath.   Cholecalciferol (VITAMIN D-3) 25 MCG (1000 UT) CAPS Take 1,000 Units by mouth at bedtime.   cyclobenzaprine (FLEXERIL) 10 MG tablet Take 1 tablet (10 mg total) by mouth 2 (two) times daily as needed for muscle spasms.   diclofenac  sodium (VOLTAREN) 1 % GEL Apply 4 g topically 4 (four) times daily. (Patient taking differently: Apply 4 g topically 4 (four) times daily as needed (for pain- to affected sites). )   escitalopram (LEXAPRO) 5 MG tablet Take 5 mg by mouth daily.   Fluticasone-Umeclidin-Vilant (TRELEGY ELLIPTA) 100-62.5-25 MCG/INH AEPB Inhale 1 puff into the lungs daily.   guaiFENesin (MUCINEX) 600 MG 12 hr tablet Take 1 tablet (600 mg total) by mouth 2 (two) times daily.   ibuprofen (ADVIL) 200 MG tablet Take 400 mg by mouth at bedtime.   losartan (COZAAR) 50 MG tablet Take 50 mg by mouth at bedtime.    OXYGEN Inhale 4 L into the lungs continuous.   oxymetazoline (AFRIN NODRIP SEVERE CONGEST) 0.05 % nasal spray Place 1 spray into both nostrils every 3 (three) hours.   predniSONE (DELTASONE) 10 MG tablet Take 30mg  (three tabs) on day one , then 20mg  ( two tabs) on day two, then 10mg  (one tab) on day three, then stop.   simvastatin (ZOCOR) 40 MG tablet Take 40 mg by mouth at bedtime.    vitamin B-12 (CYANOCOBALAMIN) 100 MCG tablet Take 100 mcg by mouth at bedtime.    Fluticasone-Umeclidin-Vilant (TRELEGY ELLIPTA) 100-62.5-25 MCG/INH AEPB Inhale 1 puff into the lungs daily.   Roflumilast (DALIRESP) 250 MCG TABS Take 250 mcg by mouth every morning.   No facility-administered encounter medications on file as of 02/19/2019.      Review of Systems  Review of Systems  Constitutional: Positive for fatigue. Negative for activity change, diaphoresis and fever.  HENT: Positive for congestion. Negative for postnasal drip, sneezing, sore throat and trouble swallowing.   Eyes: Negative.   Respiratory: Positive for cough (productive cough with yellow / clear sputum), shortness of breath and wheezing.   Cardiovascular: Negative for chest pain and palpitations.  Gastrointestinal: Negative for diarrhea, nausea and vomiting.  Endocrine: Negative.   Genitourinary: Negative.   Musculoskeletal: Negative.  Negative for  arthralgias.  Skin: Negative.   Allergic/Immunologic: Negative.   Neurological: Negative.   Hematological: Negative.   Psychiatric/Behavioral: Negative.  Negative for dysphoric mood. The patient is not nervous/anxious.      Physical Exam  BP 130/80 (BP Location: Left Arm, Cuff Size: Normal)    Pulse 90    Temp 97.9 F (36.6 C) (Oral)    Ht 6\' 1"  (1.854 m)    Wt 212 lb 6.4 oz (96.3 kg)    SpO2 94%    BMI 28.02 kg/m   Wt Readings from Last 5 Encounters:  02/19/19 212 lb 6.4 oz (96.3 kg)  02/11/19 213 lb 6.5 oz (96.8 kg)  12/24/18 210 lb (95.3 kg)  12/13/18 212 lb 6.4 oz (96.3 kg)  02/02/18 188 lb (85.3 kg)     Physical Exam Vitals signs and nursing note reviewed. Exam conducted with a chaperone present (Daughter  present).  Constitutional:      General: He is not in acute distress.    Appearance: Normal appearance. He is obese.     Comments: Chronically ill elderly male  HENT:     Head: Normocephalic and atraumatic.     Right Ear: Hearing, tympanic membrane, ear canal and external ear normal.     Left Ear: Hearing, tympanic membrane, ear canal and external ear normal.     Nose: Nose normal. No mucosal edema or rhinorrhea.     Right Turbinates: Not enlarged.     Left Turbinates: Not enlarged.     Mouth/Throat:     Mouth: Mucous membranes are dry.     Pharynx: Oropharynx is clear. No oropharyngeal exudate.  Eyes:     Pupils: Pupils are equal, round, and reactive to light.  Neck:     Musculoskeletal: Normal range of motion.  Cardiovascular:     Rate and Rhythm: Normal rate and regular rhythm.     Pulses: Normal pulses.     Heart sounds: Normal heart sounds. No murmur.  Pulmonary:     Effort: Pulmonary effort is normal.     Breath sounds: Decreased breath sounds (Throughout exam) present. No wheezing or rales.  Musculoskeletal:     Right lower leg: No edema.     Left lower leg: No edema.  Lymphadenopathy:     Cervical: No cervical adenopathy.  Skin:    General: Skin  is warm and dry.     Capillary Refill: Capillary refill takes less than 2 seconds.     Findings: No erythema or rash.  Neurological:     General: No focal deficit present.     Mental Status: He is alert and oriented to person, place, and time. Mental status is at baseline.     Motor: Weakness present.     Coordination: Coordination normal.     Gait: Gait abnormal.  Psychiatric:        Mood and Affect: Mood normal.        Behavior: Behavior normal. Behavior is cooperative.        Thought Content: Thought content normal.        Judgment: Judgment normal.      Lab Results:  CBC    Component Value Date/Time   WBC 5.4 02/13/2019 0603   RBC 4.07 (L) 02/13/2019 0603   HGB 12.0 (L) 02/13/2019 0603   HGB 13.3 06/25/2013 0822   HCT 38.8 (L) 02/13/2019 0603   HCT 39.8 06/25/2013 0822   PLT 169 02/13/2019 0603   PLT 165 06/25/2013 0822   MCV 95.3 02/13/2019 0603   MCV 89.4 06/25/2013 0822   MCH 29.5 02/13/2019 0603   MCHC 30.9 02/13/2019 0603   RDW 13.9 02/13/2019 0603   RDW 14.3 06/25/2013 0822   LYMPHSABS 1.0 02/13/2019 0603   LYMPHSABS 1.3 06/25/2013 0822   MONOABS 0.9 02/13/2019 0603   MONOABS 0.6 06/25/2013 0822   EOSABS 0.0 02/13/2019 0603   EOSABS 0.0 06/25/2013 0822   BASOSABS 0.0 02/13/2019 0603   BASOSABS 0.0 06/25/2013 0822    BMET    Component Value Date/Time   NA 139 02/13/2019 0603   NA 140 02/20/2013 1148   K 4.0 02/13/2019 0603   K 4.1 02/20/2013 1148   CL 104 02/13/2019 0603   CO2 27 02/13/2019 0603   CO2 26 02/20/2013 1148   GLUCOSE 95 02/13/2019 0603   GLUCOSE 89 02/20/2013 1148   BUN 25 (H) 02/13/2019 0603  BUN 12.8 02/20/2013 1148   CREATININE 0.73 02/13/2019 0603   CREATININE 0.8 02/20/2013 1148   CALCIUM 8.9 02/13/2019 0603   CALCIUM 9.2 02/20/2013 1148   GFRNONAA >60 02/13/2019 0603   GFRAA >60 02/13/2019 0603    BNP    Component Value Date/Time   BNP 47.6 12/24/2018 2344    ProBNP No results found for: PROBNP    Assessment  & Plan:   COPD (chronic obstructive pulmonary disease) (HCC) Assessment: COPD stage III on 2017 pulmonary function testing, DLCO 48 mMRC 4 today Maintained on Trelegy Ellipta, patient has questionable adherence to inhaler regimen Multiple exacerbations requiring hospital visits as well as emergency room visits Breath sounds clear on exam today but diminished throughout entire exam  Plan: Trial of Daliresp 250 mcg daily today Continue prednisone 10 mg daily Continue Trelegy Ellipta inhaler, sample provided today Continue rescue inhaler May need to consider azithromycin chronically for exacerbations and flares Continue oxygen therapy as prescribed Referral to home health for further management of COPD Referral to [facility] for further management of medication assistance as well as need to speak with social worker  Chronic respiratory failure with hypoxia ([place]) Plan: Continue oxygen therapy as prescribed  Former smoker Plan: Continue to not smoke    Return in about 2 weeks (around 03/05/2019), or if symptoms worsen or fail to improve, for Follow up with Dr. Vaughan Browner, Follow up with Wyn Quaker FNP-C.   Lauraine Rinne, NP 02/19/2019   This appointment was 44 minutes long with over 50% of the time in direct face-to-face patient care, assessment, plan of care, and follow-up.

## 2019-02-19 ENCOUNTER — Other Ambulatory Visit: Payer: Self-pay

## 2019-02-19 ENCOUNTER — Encounter: Payer: Self-pay | Admitting: *Deleted

## 2019-02-19 ENCOUNTER — Other Ambulatory Visit: Payer: Self-pay | Admitting: *Deleted

## 2019-02-19 ENCOUNTER — Encounter: Payer: Self-pay | Admitting: Pulmonary Disease

## 2019-02-19 ENCOUNTER — Other Ambulatory Visit: Payer: Self-pay | Admitting: Pulmonary Disease

## 2019-02-19 ENCOUNTER — Ambulatory Visit: Payer: Medicare HMO | Admitting: Pulmonary Disease

## 2019-02-19 VITALS — BP 130/80 | HR 90 | Temp 97.9°F | Ht 73.0 in | Wt 212.4 lb

## 2019-02-19 DIAGNOSIS — J9611 Chronic respiratory failure with hypoxia: Secondary | ICD-10-CM

## 2019-02-19 DIAGNOSIS — J441 Chronic obstructive pulmonary disease with (acute) exacerbation: Secondary | ICD-10-CM

## 2019-02-19 DIAGNOSIS — Z87891 Personal history of nicotine dependence: Secondary | ICD-10-CM

## 2019-02-19 MED ORDER — DALIRESP 250 MCG PO TABS: 250.0000 ug | ORAL_TABLET | Freq: Every morning | ORAL | 0 refills | Status: DC

## 2019-02-19 MED ORDER — TRELEGY ELLIPTA 100-62.5-25 MCG/INH IN AEPB: 1.0000 | INHALATION_SPRAY | Freq: Every day | RESPIRATORY_TRACT | 0 refills | Status: DC

## 2019-02-19 NOTE — Patient Outreach (Addendum)
[facility]) Care Management  02/19/2019  Cory Kemp [birth year] [id number]   Call placed to member to complete assessment of needs.  Confirms he is living in his trailer, renting a room out to a friend.  Also confirms that he has not had any hot water due to malfunction of his hot water tank.  Report that his daughter has bought him a shower chair and he will be going to her home to bathe.  Daughter would like him to come over for a hot shower several days a week until his hot water is fixed.  Member report he is not able to get it fixed right now due to finances.  Also state that there is some flooring in the home that need to be fixed, current walking on planks in some areas of the home.  Admits that his home has accumulated dust/dirt, state his tenant is able to clean some.  He is unable to hire someone to come in to clean, again due to finances.  Advised that an unclean home could pose a risk to his breathing, he verbalizes understanding.  Willing to have daughter speak with social worker regarding resources.    Report he continues to drive and is as independent as he is able to be.  Discussed the possibility of assisted living, declines stating he's not ready to give up his home and complete independence.  Report he is managing his COPD as best as he can.  Wears his oxygen, O2 sats range 91-94% consistently.  State he is taking his medications and using inhaler as instructed but also state he has several meds in the home.  He will take them with him to MD appointment tomorrow to review for clarification.    Denies any urgent concerns at this time.  Will follow up with member and daughter within the next week.  Fall Risk  02/19/2019  Falls in the past year? 0  Injury with Fall? 0    Depression screen PHQ 2/9 02/19/2019  Decreased Interest 0  Down, Depressed, Hopeless 1  PHQ - 2 Score 1     [facility] CM Care Plan Problem One     Most Recent Value  Care Plan Problem One  Risk  for readmission related to COPD management as evidenced by recent hospitalization  Role Documenting the Problem One  Care Management [place] for Problem One  Active  [facility] Long Term Goal   Member will not be readmitted to hospital within the next 31 days  [facility] Long Term Goal Start Date  02/19/19  Interventions for Problem One Long Term Goal  Discharge instructions reveiwed with daughter and member.  Educated on importance of following plan of care in effort to decrease risk of readmission  [facility] CM Short Term Goal #1   Member/daughter will report plan for increased support and plan to clean/fix home within the next 3 weeks  [facility] CM Short Term Goal #1 Start Date  02/19/19  Interventions for Short Term Goal #1  Referral placed to social worker for community resources and request for increased support.  Request for help with Medicaid application  [facility] CM Short Term Goal #2   Member will report taking medications as instructed over the next 4 weeks  [facility] CM Short Term Goal #2 Start Date  02/19/19  Interventions for Short Term Goal #2  Referral placed to pharmacy for medication management/adherence.     [name], RN, MSN [place]  Care Manager [phone]

## 2019-02-19 NOTE — Assessment & Plan Note (Signed)
Plan: Continue oxygen therapy as prescribed 

## 2019-02-19 NOTE — Patient Instructions (Addendum)
Continue taking Prednisone 10mg  daily in the morning   We will try you on Daliresp 250 mcg daily >>>Prescription for 28 tablets >>> We can reevaluate at next office visit >>> If this medication is too expensive please do not pick this up  Continue Trelegy Ellipta  >>> 1 puff daily in the morning >>>rinse mouth out after use  >>> This inhaler contains 3 medications that help manage her respiratory status, contact our office if you cannot afford this medication or unable to remain on this medication   Only use your albuterol as a rescue medication to be used if you can't catch your breath by resting or doing a relaxed purse lip breathing pattern.  - The less you use it, the better it will work when you need it. - Ok to use up to 2 puffs every 4 hours if you must but call for immediate appointment if use goes up over your usual need - Don't leave home without it !! (think of it like the spare tire for your car)   Continue oxygen therapy as prescribed  >>>maintain oxygen saturations greater than 88 percent  >>>if unable to maintain oxygen saturations please contact the office  >>>do not smoke with oxygen  >>>can use nasal saline gel or nasal saline rinses to moisturize nose if oxygen causes dryness  Note your daily symptoms >remember "red flags" for COPD:  >>>Increase in cough >>>increase in sputum production >>>increase in shortness of breath or activity  intolerance.   If you notice these symptoms, please call the office to be seen.    Order placed for [place]    Return in about 2 weeks (around 03/05/2019), or if symptoms worsen or fail to improve, for Follow up with Dr. Vaughan Browner, Follow up with Wyn Quaker FNP-C.   Coronavirus (COVID-19) Are you at risk?  Are you at risk for the Coronavirus (COVID-19)?  To be considered HIGH RISK for Coronavirus (COVID-19), you have to meet the following criteria:  . Traveled to [place], [place], [place], [place] or [place]; or in the  [place] to [place], [place], [place], or [place]; and have fever, cough, and shortness of breath within the last 2 weeks of travel OR . Been in close contact with a person diagnosed with COVID-19 within the last 2 weeks and have fever, cough, and shortness of breath . IF YOU DO NOT MEET THESE CRITERIA, YOU ARE CONSIDERED LOW RISK FOR COVID-19.  What to do if you are HIGH RISK for COVID-19?  [name] Kitchen If you are having a medical emergency, call 911. . Seek medical care right away. Before you go to a doctor's office, urgent care or emergency department, call ahead and tell them about your recent travel, contact with someone diagnosed with COVID-19, and your symptoms. You should receive instructions from your physician's office regarding next steps of care.  . When you arrive at healthcare provider, tell the healthcare staff immediately you have returned from visiting [place], [place], [place], [place] or [place]; or traveled in the [place] to [place], [place], [place], or [place]; in the last two weeks or you have been in close contact with a person diagnosed with COVID-19 in the last 2 weeks.   . Tell the health care staff about your symptoms: fever, cough and shortness of breath. . After you have been seen by a medical provider, you will be either: o Tested for (COVID-19) and discharged home on quarantine except to seek medical care  if symptoms worsen, and asked to  - Stay home and avoid contact with others until you get your results (4-5 days)  - Avoid travel on public transportation if possible (such as bus, train, or airplane) or o Sent to the Emergency Department by EMS for evaluation, COVID-19 testing, and possible admission depending on your condition and test results.  What to do if you are LOW RISK for COVID-19?  Reduce your risk of any infection by using the same precautions used for avoiding the common cold or flu:  [name] Kitchen Wash your hands often with soap and warm water  for at least 20 seconds.  If soap and water are not readily available, use an alcohol-based hand sanitizer with at least 60% alcohol.  . If coughing or sneezing, cover your mouth and nose by coughing or sneezing into the elbow areas of your shirt or coat, into a tissue or into your sleeve (not your hands). . Avoid shaking hands with others and consider head nods or verbal greetings only. . Avoid touching your eyes, nose, or mouth with unwashed hands.  . Avoid close contact with people who are sick. . Avoid places or events with large numbers of people in one location, like concerts or sporting events. . Carefully consider travel plans you have or are making. . If you are planning any travel outside or inside the [place], visit the CDC's Travelers' Health webpage for the latest health notices. . If you have some symptoms but not all symptoms, continue to monitor at home and seek medical attention if your symptoms worsen. . If you are having a medical emergency, call 911.   [place] / e-Visit: [email or web address]         [facility]: [facility]: [phone]                   [facility]: [phone]           It is flu season:   >>> Best ways to protect herself from the flu: Receive the yearly flu vaccine, practice good hand hygiene washing with soap and also using hand sanitizer when available, eat a nutritious meals, get adequate rest, hydrate appropriately   Please contact the office if your symptoms worsen or you have concerns that you are not improving.   Thank you for choosing [facility] for your healthcare, and for allowing [place] to partner with you on your healthcare journey. I am thankful to be able to provide care to you today.   Wyn Quaker FNP-C

## 2019-02-19 NOTE — Assessment & Plan Note (Signed)
Assessment: COPD stage III on 2017 pulmonary function testing, DLCO 48 mMRC 4 today Maintained on Trelegy Ellipta, patient has questionable adherence to inhaler regimen Multiple exacerbations requiring hospital visits as well as emergency room visits Breath sounds clear on exam today but diminished throughout entire exam  Plan: Trial of Daliresp 250 mcg daily today Continue prednisone 10 mg daily Continue Trelegy Ellipta inhaler, sample provided today Continue rescue inhaler May need to consider azithromycin chronically for exacerbations and flares Continue oxygen therapy as prescribed Referral to home health for further management of COPD Ref[facility] to [facility] for further management of medication assistance as well as need to speak with social worker

## 2019-02-19 NOTE — Assessment & Plan Note (Signed)
Plan: Continue to not smoke 

## 2019-02-20 ENCOUNTER — Other Ambulatory Visit: Payer: Self-pay

## 2019-02-20 ENCOUNTER — Encounter: Payer: Self-pay | Admitting: *Deleted

## 2019-02-20 NOTE — Patient Outreach (Signed)
[facility]) Care Management  02/20/2019  Cory Kemp 06-Nov-1940 [id number]   Social work referral received from [facility], [employer].   "Patient's daughter Cory Kemp concerned with patient being able to independently care for self adequately. Would like to review options of increased support. Does not have Medicaid but willing to apply. Lives in trailer, does not have hot water, home need some modification and repairs. Would like to have affordable resources in community." Unsuccessful outreach to daughter today.  Left voicemail message. Will attempt to reach again within four business days.  Ronn Melena, BSW Social Worker [phone]

## 2019-02-21 ENCOUNTER — Other Ambulatory Visit: Payer: Self-pay | Admitting: Pharmacist

## 2019-02-21 NOTE — Patient Outreach (Signed)
[facility]) Care Management  [place]   02/21/2019  Cory Kemp 07/30/1941 [id number]  Reason for referral: Medication Assistance, Medication Management  Referral source: Dr. Wyn Quaker Current insurance: [employer] Premier Plus  **PCP is Dr. [name] with [facility]).  [facility] has clinical pharmacist embedded in their practice.    PMHx includes but not limited to:  COPD, T2DM, OSA, HTN, HLD, alcohol abuse, anemia, B-12 deficiency, BPH, depression / anxiety with several recent hospitalizations for acute on chronic COPD exacerbations.    Outreach:  Successful telephone call with Cory Kemp.  HIPAA identifiers verified.  Patient states he is not at home right now and does not have any of his medication bottles with him.  He requests that I call him back on Monday morning to review medications.    Plan: Will call patient on July 20th, Monday, 9:30AM.    Ralene Bathe, PharmD, [place] [phone]

## 2019-02-22 ENCOUNTER — Other Ambulatory Visit: Payer: Self-pay

## 2019-02-22 NOTE — Patient Outreach (Signed)
[facility]) Care Management  02/22/2019  Cory Kemp [birth year] [id number]   Social work referral received from [facility], [employer].   "Patient's daughter [name] concerned with patient being able to independently care for self adequately. Would like to review options of increased support. Does not have Medicaid but willing to apply. Lives in trailer, does not have hot water, home need some modification and repairs. Would like to have affordable resources in community." Second unsuccessful outreach to daughter today.  Left voicemail message. Will attempt to reach again within four business days. Unsuccessful outreach letter mailed to patient.  Ronn Melena, BSW Social Worker [phone]

## 2019-02-25 ENCOUNTER — Ambulatory Visit: Payer: Self-pay | Admitting: Pharmacist

## 2019-02-25 ENCOUNTER — Other Ambulatory Visit: Payer: Self-pay | Admitting: Pharmacist

## 2019-02-25 NOTE — Patient Outreach (Signed)
[facility]) Care Management  [place]   02/25/2019  ELENA COTHERN 08-25-1940 [id number]   Reason for referral: Medication Assistance, Medication Management  Referral source: Dr. Wyn Quaker Current insurance: [employer] Premier Plus  **PCP is Dr. [name] with [facility]).  [facility] has clinical pharmacist embedded in their practice.    PMHx includes but not limited to:  COPD, T2DM, OSA, HTN, HLD, alcohol abuse, anemia, B-12 deficiency, BPH, depression / anxiety with several recent hospitalizations for acute on chronic COPD exacerbations.    Outreach:  Successful call to Mr. Pendergrass however he has already left his house and forgot to bring his medications with him.  He requests that I call him either later in the evening 6-7PM or tomorrow morning.  As I am unable to call him this late at night, we made another appointment to review medications tomorrow morning.   Ralene Bathe, PharmD, [place] [phone]

## 2019-02-26 ENCOUNTER — Other Ambulatory Visit: Payer: Self-pay | Admitting: Pharmacist

## 2019-02-26 ENCOUNTER — Other Ambulatory Visit: Payer: Self-pay

## 2019-02-26 ENCOUNTER — Ambulatory Visit: Payer: Self-pay | Admitting: Pharmacist

## 2019-02-26 NOTE — Patient Outreach (Signed)
[place] [facility]) Care Management  02/26/2019  Cory Kemp 11-24-1940 [id number]   Social work referral received from [employer], [employer].   "Patient's daughter [name] concerned with patient being able to independently care for self adequately. Would like to review options of increased support. Does not have Medicaid but willing to apply. Lives in trailer, does not have hot water, home need some modification and repairs. Would like to have affordable resources in community." Successful outreach to daughter today.   BSW and daughter discussed in-home aide services and what is covered under Medicare versus Medicaid.  Patient is over the income limit to qualify for Medicaid but is not able to afford to privately pay for services.  Daughter inquired about applying for SSI and BSW provided her with contact information for the local Social Security Administration. Daughter expressed concern about condition of patient's home.  She reported that he currently does not have hot water due to water heater being broken; patient and/or family cannot afford to replace.  BSW inquired about safety concerns in the home.  Daughter recently bought him a shower chair that he uses to "sponge bathe".  Daughter stated that she has offered multiple times for him to come to her home to take a hot shower but patient declines.  Daughter did not express significant safety concerns but is concerned about lack of hot water and  his home being dirty. She also stated that his power bill is very high so they believe lack of insulation is a problem.  BSW and daughter discussed the USAA Program through [place].  BSW provided her with contact information so that she can call regarding eligibility and the application process.  BSW and daughter also discussed [employer] and [place] as potential options.  She consented to referrals being submitted.  BSW will follow up with her when response is  received about referrals.  Ronn Melena, BSW Social Worker [phone]

## 2019-02-26 NOTE — Patient Outreach (Signed)
[name]) Care Management  [place]   02/26/2019  Cory Kemp 11/05/1940 [id number]  Reason for referral: Medication Assistance, Medication Management  Referral source: Dr. Wyn Quaker Current insurance: Holland FallingPCP is Dr. [name] with [facility]). [facility] has clinical pharmacist embedded in their practice.   PMHx includes but not limited VW:PVXY, T2DM, OSA, HTN, HLD, alcohol abuse, anemia, B-12 deficiency, BPH, depression / anxiety with several recent hospitalizations for acute on chronic COPD exacerbations.  Outreach:  8:40AM Unsuccessful telephone call with Cory Kemp.  Patient answered phone but stated he was having "problems" and requested that I call him back this afternoon.   2:47PM Successful call to Cory Kemp who reports he is now ready to review medications.  HIPAA identifiers verified.  Subjective:  Patient reports he needs to go to the pharmacy today to pick up prescriptions.  He states there is a prescription ready but he is not sure what it is.  He thinks his monthly income is ~[employer] $1200 + job at [employer] $450 / month part time (last worked March 2020) + $350 income from roommate cash. He requests that I contact his daughter Cory Kemp to discuss patient assistance programs as she has been helping him with this.     Unsuccessful call attempt to daughter.  Left HIPAA compliant voicemail requesting a return call.    Objective: The ASCVD Risk score [name], et al., 2013) failed to calculate for the following reasons:   Cannot find a previous HDL lab   Cannot find a previous total cholesterol lab  Lab Results  Component Value Date   CREATININE 0.73 02/13/2019   CREATININE 0.75 02/12/2019   CREATININE 0.93 02/11/2019    Lab Results  Component Value Date   HGBA1C 5.7 (H) 12/25/2018    Lipid Panel  No results found for: CHOL, TRIG, HDL, CHOLHDL, VLDL, LDLCALC, LDLDIRECT  BP Readings from Last 3  Encounters:  02/19/19 130/80  02/13/19 (!) 165/54  02/09/19 140/74    Allergies  Allergen Reactions  . Adhesive [Tape] Rash    No "PLASTIC" tape!!! Patient broke out in blisters!!    Medications Reviewed Today    Reviewed by Cory Rinne, NP (Nurse Practitioner) on 02/19/19 at 1518  Med List Status: <None>  Medication Order Taking? Sig Documenting Provider Last Dose Status Informant  albuterol (PROVENTIL) (2.5 MG/3ML) 0.083% nebulizer solution [id number] Yes Take 3 mLs (2.5 mg total) by nebulization every 6 (six) hours as needed for wheezing or shortness of breath. Cory Rinne, NP Taking Active Self  albuterol (VENTOLIN HFA) 108 (90 Base) MCG/ACT inhaler [id number] Yes Inhale 2 puffs into the lungs every 4 (four) hours as needed for wheezing or shortness of breath. Cory Rinne, NP Taking Active Self  Cholecalciferol (VITAMIN D-3) 25 MCG (1000 UT) CAPS [id number] Yes Take 1,000 Units by mouth at bedtime. [provider] Taking Active Self  cyclobenzaprine (FLEXERIL) 10 MG tablet [id number] Yes Take 1 tablet (10 mg total) by mouth 2 (two) times daily as needed for muscle spasms. Maudie Flakes, MD Taking Active Self  diclofenac sodium (VOLTAREN) 1 % GEL [id number] Yes Apply 4 g topically 4 (four) times daily.  Patient taking differently: Apply 4 g topically 4 (four) times daily as needed (for pain- to affected sites).    Deno Etienne, DO Taking Active Self  escitalopram (LEXAPRO) 5 MG tablet [id number] Yes Take 5 mg by mouth daily. [provider] Taking Active  Self           Med Note Duffy Bruce, Legrand Como   Fri Feb 08, 2019  7:39 PM) PROVIDER- Patient needs a new Rx, please  Fluticasone-Umeclidin-Vilant (TRELEGY ELLIPTA) 100-62.5-25 MCG/INH AEPB [id number] Yes Inhale 1 puff into the lungs daily. [name], MD Taking Active Self           Med Note Duffy Bruce, Legrand Como   Fri Feb 08, 2019  7:39 PM) Patient doesn't use this when he's using the Combivent Respimat  guaiFENesin (MUCINEX)  600 MG 12 hr tablet [id number] Yes Take 1 tablet (600 mg total) by mouth 2 (two) times daily. Florencia Reasons, MD Taking Active   ibuprofen (ADVIL) 200 MG tablet [id number] Yes Take 400 mg by mouth at bedtime. [provider] Taking Active Self  losartan (COZAAR) 50 MG tablet [id number] Yes Take 50 mg by mouth at bedtime.  [provider] Taking Active Self  OXYGEN [id number] Yes Inhale 4 L into the lungs continuous. [provider] Taking Active Self  oxymetazoline (AFRIN NODRIP SEVERE CONGEST) 0.05 % nasal spray [id number] Yes Place 1 spray into both nostrils every 3 (three) hours. [provider] Taking Active Self  predniSONE (DELTASONE) 10 MG tablet [id number] Yes Take 49m (three tabs) on day one , then 264m( two tabs) on day two, then 1033mone tab) on day three, then stop. Xu,Florencia ReasonsD Taking Active   simvastatin (ZOCOR) 40 MG tablet [id number] Take 40 mg by mouth at bedtime.  [provider] Taking Active Self  vitamin B-12 (CYANOCOBALAMIN) 100 MCG tablet [id number] Take 100 mcg by mouth at bedtime.  [provider] Taking Active Self          Assessment: Drugs sorted by system:  Neurologic/Psychologic: escitalopram  Cardiovascular: simvastatin, losartan  Pulmonary/Allergy: albuterol nebulizer + inhaler, Combivent inhaler, Trelegy inhaler, afrin NS, roflumilast   Gastrointestinal: guaifenesin  Endocrine: prednisone  Pain:ibuprofen  Vitamins/Minerals/Supplements: vitamin D3, vitamin B12  Medication Review Findings:  . Patient has not picked up Daliresp since pulmonary visit on 7/14.  Encouraged patient to stop by pharmacy today to find out co-pay.  Patient voiced understanding.  If he is able, he will do a trial on Daliresp.  . Simvastatin: not currently taking.  Recommended that he request refill when he goes to the pharmacy later today.  Patient wrote down name of medication and will request refill.   . Combivent and Albuterol  inhaler: patient unclear when do use either inhaler.  Will clarify with pulmonary.    Medication Assistance Findings:  Medication assistance needs identified: Inhalers  Extra Help:  May be eligible for Partial or Full Extra Help Low Income Subsidy based on reported income and assets  -Patient reports his daughter is assisting him with applications for various programs.  Requests that I contact her to see if she has looked into Extra Help.    Patient Assistance Programs: Trelegy made by GSKUptonquirement met: Yes o Out-of-pocket prescription expenditure met:   Unknown - Reviewed program requirements with patient.   - Patient will request print out of TROOP from pharmacy later today  Plan: . Message left with daughter requesting return call . Will f/u in 3-4 days if I have not heard back yet   ColRalene BatheharmD, [phone]

## 2019-02-28 ENCOUNTER — Other Ambulatory Visit: Payer: Self-pay | Admitting: *Deleted

## 2019-02-28 ENCOUNTER — Other Ambulatory Visit: Payer: Self-pay | Admitting: Pharmacist

## 2019-02-28 NOTE — Patient Outreach (Signed)
[place] [facility]) Care Management  02/28/2019  Cory Kemp May 16, 1941 [id number]   Call placed to member to follow up on COPD management.  State he's feeling much better, report breathing sometimes "like a teenager" but other times short of breath with coughing.  Report sputum production has been clear and oxygen levels remain 90-93%.  He state he has been using inhalers as instructed, using rescue inhaler and nebulizer as needed.  Aware of referral to pharmacist and attempt to contact both he and his daughter.  Has follow up appointment next week with pulmonologist, daughter will accompany that visit as well.  Denies any urgent concerns, will follow up with member and/or daughter within the next 2 weeks.  [facility] CM Care Plan Problem One     Most Recent Value  Care Plan Problem One  Risk for readmission related to COPD management as evidenced by recent hospitalization  Role Documenting the Problem One  Care Management [place] for Problem One  Active  [facility] Long Term Goal   Member will not be readmitted to hospital within the next 31 days  [facility] Long Term Goal Start Date  02/19/19  Interventions for Problem One Long Term Goal  Reviewed COPD action plan with member, especially use of rescue inhaler and nebulizers.  Reviewed upcoming appointment with pulmonary specialist  [facility] CM Short Term Goal #1   Member/daughter will report plan for increased support and plan to clean/fix home within the next 3 weeks  [facility] CM Short Term Goal #1 Start Date  02/19/19  Interventions for Short Term Goal #1  Verifed social worker was in contact with daughter, advised member of importance of having daughter follow up with provided resources  [facility] CM Short Term Goal #2   Member will report taking medications as instructed over the next 4 weeks  [facility] CM Short Term Goal #2 Start Date  02/19/19  Interventions for Short Term Goal #2  Advised member of attempt for [facility] pharmacist to contact member and  daughter to review medications.  Advised to have daughter return call to pharmacist.     [name], RN, MSN [place] Manager [phone]

## 2019-02-28 NOTE — Patient Outreach (Signed)
[place] [facility]) Care Management  Cory Kemp 02/28/2019  [name] 1941/06/21 [id number]  Reason for call:  F/u with daughter re: patient eligibility for patient assistance and medication mgmt  Outreach:  Unsuccessful telephone call attempt #2 to patient's daughter, [name]. HIPAA compliant voicemail left requesting a return call  Plan:  Will try to reach daughter again early next week  Ralene Bathe, PharmD, [place] [phone]

## 2019-03-03 DIAGNOSIS — J439 Emphysema, unspecified: Secondary | ICD-10-CM | POA: Diagnosis not present

## 2019-03-03 DIAGNOSIS — G4733 Obstructive sleep apnea (adult) (pediatric): Secondary | ICD-10-CM | POA: Diagnosis not present

## 2019-03-03 DIAGNOSIS — R69 Illness, unspecified: Secondary | ICD-10-CM | POA: Diagnosis not present

## 2019-03-03 DIAGNOSIS — N400 Enlarged prostate without lower urinary tract symptoms: Secondary | ICD-10-CM | POA: Diagnosis not present

## 2019-03-03 DIAGNOSIS — E538 Deficiency of other specified B group vitamins: Secondary | ICD-10-CM | POA: Diagnosis not present

## 2019-03-03 DIAGNOSIS — I2510 Atherosclerotic heart disease of native coronary artery without angina pectoris: Secondary | ICD-10-CM | POA: Diagnosis not present

## 2019-03-03 DIAGNOSIS — I10 Essential (primary) hypertension: Secondary | ICD-10-CM | POA: Diagnosis not present

## 2019-03-03 DIAGNOSIS — E119 Type 2 diabetes mellitus without complications: Secondary | ICD-10-CM | POA: Diagnosis not present

## 2019-03-03 DIAGNOSIS — J9611 Chronic respiratory failure with hypoxia: Secondary | ICD-10-CM | POA: Diagnosis not present

## 2019-03-03 DIAGNOSIS — J45909 Unspecified asthma, uncomplicated: Secondary | ICD-10-CM | POA: Diagnosis not present

## 2019-03-04 ENCOUNTER — Ambulatory Visit: Payer: Self-pay | Admitting: Pharmacist

## 2019-03-04 ENCOUNTER — Other Ambulatory Visit: Payer: Self-pay | Admitting: Pharmacist

## 2019-03-04 DIAGNOSIS — I10 Essential (primary) hypertension: Secondary | ICD-10-CM | POA: Diagnosis not present

## 2019-03-04 DIAGNOSIS — J45909 Unspecified asthma, uncomplicated: Secondary | ICD-10-CM | POA: Diagnosis not present

## 2019-03-04 DIAGNOSIS — N400 Enlarged prostate without lower urinary tract symptoms: Secondary | ICD-10-CM | POA: Diagnosis not present

## 2019-03-04 DIAGNOSIS — I2510 Atherosclerotic heart disease of native coronary artery without angina pectoris: Secondary | ICD-10-CM | POA: Diagnosis not present

## 2019-03-04 DIAGNOSIS — J9611 Chronic respiratory failure with hypoxia: Secondary | ICD-10-CM | POA: Diagnosis not present

## 2019-03-04 DIAGNOSIS — E119 Type 2 diabetes mellitus without complications: Secondary | ICD-10-CM | POA: Diagnosis not present

## 2019-03-04 DIAGNOSIS — J439 Emphysema, unspecified: Secondary | ICD-10-CM | POA: Diagnosis not present

## 2019-03-04 DIAGNOSIS — R69 Illness, unspecified: Secondary | ICD-10-CM | POA: Diagnosis not present

## 2019-03-04 DIAGNOSIS — E538 Deficiency of other specified B group vitamins: Secondary | ICD-10-CM | POA: Diagnosis not present

## 2019-03-04 DIAGNOSIS — G4733 Obstructive sleep apnea (adult) (pediatric): Secondary | ICD-10-CM | POA: Diagnosis not present

## 2019-03-04 NOTE — Patient Outreach (Signed)
[facility]) Care Management  [place]  03/04/2019  Cory Kemp 24-Mar-1941 [id number]   Reason for call:  F/u with daughter re: patient eligibility for patient assistance and medication mgmt  Outreach:  Unsuccessful telephone call attempt #3 to patient's daughter, [name]. HIPAA compliant voicemail left requesting a return call  Plan:  -I will make another outreach attempt to patient within 3-4 business days.    Ralene Bathe, PharmD, [place] [phone]

## 2019-03-04 NOTE — Progress Notes (Signed)
@Patient  ID: Cory Kemp, male    DOB: August 09, 1940, 78 y.o.   MRN: [id number]  Chief Complaint  Patient presents with   Follow-up    Started daliresp, reports since starting he does have a slight stomach ache. He is using Trelegy daily, albuterol HFA and neb prn.     Referring provider: Jani Gravel, MD  HPI:  78 year old former smoker followed in our office for COPD and OSA   PMH: DMT2, HTN  Smoker/ Smoking History: Former Smoker  Maintenance:  Trelegy Ellipta  Pt of: Dr. Vaughan Browner  03/05/2019  - Visit   78 year old male former smoker followed in our office for COPD, emphysema and chronic respiratory failure.  Patient presenting to our office today as a two-week follow-up visit.  Patient continues to have confusion regarding medication management.  He reports he is maintained on Trelegy Ellipta, but has been using Combivent 4 times daily, albuterol rescue inhaler 10 times daily, and albuterol nebulized meds 2 times daily.  Patient is also maintained on prednisone 10 mg daily, and is on a trial of Daliresp 250 mcg daily.  Patient reports the cost of the Daliresp was not significantly elevated.  He has had slight increase in nausea over the last 1 to 2 weeks.  They are unsure if this is related to the [place].  Patient attempted to qualify for Medicaid with the assistance of [facility] RN [facility].  Unfortunately the patient did not qualify for Medicaid due to receiving $100 a month over the qualifying amount per patient's daughter.  They are going to try to see if they get the patient qualified for SSI.  They are unsure if this will be successful.  There have been attempts by the [facility] pharmacist to reach patient's daughter.  MMRC - Breathlessness Score 3 - I stop for breath after walking about 100 yards or after a few minutes on level ground (isle at grocery store is 161ft)   Tests:    CT chest 09/12/14- Emphysematous changes noted bilaterally.  Minimal posterior basilar scarring or subsegmental atelectasis is noted. No pulmonary mass or nodule is noted  CT angiogram 04/10/16- emphysema, chronic bronchitis.  Subsegmental atelectasis at the bases mildly increased compared to previous  Chest x-ray 07/02/17-hyperinflated lungs.  No acute abnormality.  12/25/2018-CTA chest- no evidence of acute PE, emphysema with left greater than right lower lobe pneumonia, small layering left pleural effusion, mild reactive hilar lymph nodes  02/11/2019-CTA chest- no definite large central PE, moderate to severe emphysema with bullous changes of the right lung base, 9 mm focal nodularity right middle lobe appear similar to prior CT and may represent scarring  PFTs 01/21/16 FVC 2.44 (71%), FEV1 1.47 [41%), F/F 43, TLC 122%, RV/TLC 166%, DLCO 48% Severe obstruction with reduction in diffusion capacity. Hyperinflation with air trapping.  Home sleep test 11/14/16 Moderate OSA with desats.  In lab sleep study 12/16/2017 AHI 0.8, nocturnal desaturations.  FENO:  No results found for: NITRICOXIDE  PFT: PFT Results Latest Ref Rng & Units 01/21/2016  FVC-Pre L 3.44  FVC-Predicted Pre % 71  FVC-Post L 3.65  FVC-Predicted Post % 75  Pre FEV1/FVC % % 43  Post FEV1/FCV % % 45  FEV1-Pre L 1.47  FEV1-Predicted Pre % 41  FEV1-Post L 1.66  DLCO UNC% % 48  DLCO COR %Predicted % 64  TLC L 9.57  TLC % Predicted % 123  RV % Predicted % 215    Imaging: Dg Chest  2 View  Result Date: 02/13/2019 CLINICAL DATA:  Shortness of breath and cough today. History of COPD and hypertension. EXAM: CHEST - 2 VIEW COMPARISON:  Radiographs and CT 02/11/2019. Older studies are also correlated. FINDINGS: The heart size and mediastinal contours are stable with aortic atherosclerosis. The lungs are hyperinflated with stable streaky opacities at both lung bases, likely postinflammatory scarring or atelectasis as correlated with recent CT. There is no new airspace disease, significant  pleural effusion or pneumothorax. The bones appear unchanged. IMPRESSION: No change from recent prior studies. Emphysema with probable bibasilar atelectasis or scarring. Electronically Signed   By: Richardean Sale M.D.   On: 02/13/2019 09:38   Dg Chest 2 View  Result Date: 02/08/2019 CLINICAL DATA:  Shortness of breath EXAM: CHEST - 2 VIEW COMPARISON:  02/08/2019, CT 12/25/2018, radiograph 12/24/2018, 01/03/2018 FINDINGS: Hyperinflation with emphysematous disease. Streaky atelectasis at the left base. Bronchitic changes and scarring at both bases. No pleural effusion. No focal consolidation. Stable cardiomediastinal silhouette with aortic atherosclerosis. No pneumothorax IMPRESSION: 1. Hyperinflation with emphysematous disease. 2. Subsegmental atelectasis left base. Bronchitic changes and scarring at both bases Electronically Signed   By: Donavan Foil M.D.   On: 02/08/2019 23:12   Ct Angio Chest Pe W And/or Wo Contrast  Result Date: 02/11/2019 CLINICAL DATA:  78 year old male with hypoxemia and respiratory failure. A stent hospitalization for COPD exacerbation. EXAM: CT ANGIOGRAPHY CHEST WITH CONTRAST TECHNIQUE: Multidetector CT imaging of the chest was performed using the standard protocol during bolus administration of intravenous contrast. Multiplanar CT image reconstructions and MIPs were obtained to evaluate the vascular anatomy. CONTRAST:  17mL OMNIPAQUE IOHEXOL 350 MG/ML SOLN COMPARISON:  Chest radiograph dated 02/11/2019 and CT dated 12/25/2018 FINDINGS: Evaluation of this exam is limited due to respiratory motion artifact. Cardiovascular: There is no cardiomegaly or pericardial effusion. Multi vessel coronary vascular calcification. There is moderate atherosclerotic calcification of the thoracic aorta. No aneurysmal dilatation or evidence of dissection. The origins of the great vessels of the aortic arch appear patent as visualized. Evaluation of the pulmonary arteries is limited due to respiratory  motion artifact as well as suboptimal opacification and timing of the contrast. No definite large or central pulmonary artery embolus identified. Mediastinum/Nodes: There is no hilar or mediastinal adenopathy. The esophagus and the thyroid gland are grossly unremarkable. No mediastinal fluid collection. Lungs/Pleura: Moderate to severe emphysema with bullous changes of the right lung base. Bibasilar streaky and subsegmental densities noted. Overall interval decrease in the consolidative changes of the left lower lobe. This may represent atelectatic changes/scarring, although residual or recurrent pneumonia is not excluded. Clinical correlation is recommended. There is no pleural effusion or pneumothorax. A 9 mm focal nodularity in the right middle lobe (series 11, image 136) appears similar to prior CT and may represent an area of scarring. Attention on follow-up imaging recommended. The central airways are patent. Upper Abdomen: A most consistent with a cyst. 4.7 cm hypodense lesion from the upper pole of the right kidney Musculoskeletal: Degenerative changes of the spine. No acute osseous pathology. Review of the MIP images confirms the above findings. IMPRESSION: 1. No definite large or central pulmonary artery embolus on limited study. 2. Moderate to severe emphysema with bullous changes of the right lung base. 3. Bibasilar streaky and subsegmental densities may represent atelectasis/scarring versus residual or recurrent pneumonia. Clinical correlation is recommended. Overall interval improvement of the aeration of the left lower lobe with decrease in the size of consolidative area since the prior CT 4. A  9 mm focal nodularity in the right middle lobe appears similar to prior CT and may represent scarring. Attention on follow-up imaging recommended. 5. Aortic Atherosclerosis (ICD10-I70.0) and Emphysema (ICD10-J43.9). Electronically Signed   By: Anner Crete M.D.   On: 02/11/2019 12:41   Dg Chest Portable 1  View  Result Date: 02/11/2019 CLINICAL DATA:  78 year old with shortness of breath.  Hypoxia. EXAM: PORTABLE CHEST 1 VIEW COMPARISON:  02/08/2027 and on 12/24/2018.  Chest CT 12/25/2018 FINDINGS: Again noted is hyperinflation. Few densities at the left lung base are suggestive for atelectasis. Right costophrenic angle is incompletely imaged on this examination. Heart size is upper limits of normal but stable. Atherosclerotic calcifications at the aortic arch. No focal airspace disease or pulmonary edema. Patchy densities at the right lung base appear chronic. IMPRESSION: 1. Patchy densities at the lung bases are suggestive for atelectasis and/or scarring. 2. Hyperinflation and emphysema. Electronically Signed   By: Markus Daft M.D.   On: 02/11/2019 09:06   Dg Chest Portable 1 View  Result Date: 02/08/2019 CLINICAL DATA:  Shortness of breath. EXAM: PORTABLE CHEST 1 VIEW COMPARISON:  Radiographs of Dec 24, 2018. FINDINGS: Stable cardiomegaly. Atherosclerosis of thoracic aorta is noted. No pneumothorax is noted. Lung bases are not completely visualized on this study. No definite pulmonary abnormality is noted within the visualized lung fields. Bony thorax is unremarkable. IMPRESSION: Limited exam as lung bases are not completely visualized. No definite acute abnormality is noted. Aortic Atherosclerosis (ICD10-I70.0). Electronically Signed   By: Marijo Conception M.D.   On: 02/08/2019 18:14      Specialty Problems      Pulmonary Problems   COPD (chronic obstructive pulmonary disease) (HCC)    PFTs 01/21/16 FVC 2.44 (71%), FEV1 1.47 [41%), F/F 43, TLC 122%, RV/TLC 166%, DLCO 48% Severe obstruction with reduction in diffusion capacity. Hyperinflation with air trapping.  CT chest 09/12/14- Emphysematous changes noted bilaterally. Minimal posterior basilar scarring or subsegmental atelectasis is noted. No pulmonary mass or nodule is noted       OSA (obstructive sleep apnea)   Chronic respiratory failure with  hypoxia (HCC)   Acute on chronic respiratory failure with hypoxia (HCC)   CAP (community acquired pneumonia)   COPD with acute exacerbation ([place])   Acute exacerbation of chronic obstructive pulmonary disease (COPD) (HCC)      Allergies  Allergen Reactions   Adhesive [Tape] Rash    No "PLASTIC" tape!!! Patient broke out in blisters!!    Immunization History  Administered Date(s) Administered   Influenza, High Dose Seasonal PF 05/21/2018   Pneumococcal Conjugate-13 02/02/2018   Pneumococcal Polysaccharide-23 03/05/2019   Pneumovax23 today   Past Medical History:  Diagnosis Date   Alcohol abuse    Anemia    Arthritis    Asthma    B12 deficiency 11/03/2013   BPH (benign prostatic hypertrophy)    COPD (chronic obstructive pulmonary disease) ([place])    Dementia (HCC)    ?   Diabetes mellitus ([place]) 01/10/2012   Femoral hernia, right 03/27/2012   Glucose intolerance (impaired glucose tolerance)    Hyperlipidemia    Hypertension    Incisional hernias - swiss-cheese-type periumbilical 02/13/2955   Inguinal hernia - right 01/10/2012   Leukopenia 01/29/2013   Monocytosis 01/29/2013   Normochromic anemia 01/29/2013   Osteopenia     Tobacco History: Social History   Tobacco Use  Smoking Status Former Smoker   Packs/day: 1.00   Years: 63.00   Pack years: 63.00   Types:  Cigarettes   Start date: 08/09/1955   Quit date: 08/08/2018   Years since quitting: 0.5  Smokeless Tobacco Never Used  Tobacco Comment   reports he stopped at the beginning of this year   Counseling given: Yes Comment: reports he stopped at the beginning of this year   Continue to not smoke  Outpatient Encounter Medications as of 03/05/2019  Medication Sig   albuterol (PROVENTIL) (2.5 MG/3ML) 0.083% nebulizer solution Take 3 mLs (2.5 mg total) by nebulization every 6 (six) hours as needed for wheezing or shortness of breath.   albuterol (VENTOLIN HFA) 108 (90 Base) MCG/ACT inhaler  Inhale 2 puffs into the lungs every 4 (four) hours as needed for wheezing or shortness of breath.   Cholecalciferol (VITAMIN D-3) 25 MCG (1000 UT) CAPS Take 1,000 Units by mouth at bedtime.   Cyanocobalamin (VITAMIN B-12) 5000 MCG TBDP Take 5,000 mcg by mouth daily.    escitalopram (LEXAPRO) 5 MG tablet Take 5 mg by mouth daily.   guaiFENesin (MUCINEX) 600 MG 12 hr tablet Take 1 tablet (600 mg total) by mouth 2 (two) times daily.   ibuprofen (ADVIL) 200 MG tablet Take 400 mg by mouth at bedtime.   Ipratropium-Albuterol (COMBIVENT RESPIMAT) 20-100 MCG/ACT AERS respimat Inhale 1 puff into the lungs every 6 (six) hours as needed for wheezing.   losartan (COZAAR) 50 MG tablet Take 50 mg by mouth at bedtime.    OXYGEN Inhale 4 L into the lungs continuous.   oxymetazoline (AFRIN NODRIP SEVERE CONGEST) 0.05 % nasal spray Place 1 spray into both nostrils every 3 (three) hours.   predniSONE (DELTASONE) 10 MG tablet Take 30mg  (three tabs) on day one , then 20mg  ( two tabs) on day two, then 10mg  (one tab) on day three, then stop. (Patient taking differently: Take 10 mg by mouth daily with breakfast. Take 30mg  (three tabs) on day one , then 20mg  ( two tabs) on day two, then 10mg  (one tab) on day three, then stop.)   Roflumilast (DALIRESP) 250 MCG TABS Take 250 mcg by mouth every morning.   simvastatin (ZOCOR) 40 MG tablet Take 40 mg by mouth at bedtime.    TRELEGY ELLIPTA 100-62.5-25 MCG/INH AEPB INHALE 1 PUFF BY MOUTH EVERY DAY.   No facility-administered encounter medications on file as of 03/05/2019.      Review of Systems  Review of Systems  Constitutional: Positive for fatigue. Negative for activity change, chills, fever and unexpected weight change.  HENT: Negative for postnasal drip, rhinorrhea, sinus pressure, sinus pain and sore throat.   Eyes: Negative.   Respiratory: Positive for shortness of breath and wheezing. Negative for cough.   Cardiovascular: Negative for chest pain and  palpitations.  Gastrointestinal: Negative for constipation, diarrhea, nausea and vomiting.  Endocrine: Negative.   Genitourinary: Negative.   Musculoskeletal: Negative.   Skin: Negative.   Neurological: Negative for dizziness and headaches.  Psychiatric/Behavioral: Negative.  Negative for dysphoric mood. The patient is not nervous/anxious.   All other systems reviewed and are negative.    Physical Exam  BP (!) 152/82    Pulse 77    Temp 98.1 F (36.7 C) (Oral)    Ht 6\' 1"  (1.854 m)    Wt 207 lb 9.6 oz (94.2 kg)    SpO2 94%    BMI 27.39 kg/m   Wt Readings from Last 5 Encounters:  03/05/19 207 lb 9.6 oz (94.2 kg)  02/19/19 212 lb 6.4 oz (96.3 kg)  02/11/19 213 lb 6.5 oz (  96.8 kg)  12/24/18 210 lb (95.3 kg)  12/13/18 212 lb 6.4 oz (96.3 kg)     Physical Exam Vitals signs and nursing note reviewed.  Constitutional:      General: He is not in acute distress.    Appearance: Normal appearance. He is normal weight.     Comments: Chronically ill elderly male  HENT:     Head: Normocephalic and atraumatic.     Right Ear: Hearing and external ear normal.     Left Ear: Hearing and external ear normal.     Nose: Mucosal edema and rhinorrhea present.     Mouth/Throat:     Mouth: Mucous membranes are dry.     Pharynx: Oropharynx is clear. No oropharyngeal exudate.  Eyes:     Pupils: Pupils are equal, round, and reactive to light.  Neck:     Musculoskeletal: Normal range of motion.  Cardiovascular:     Rate and Rhythm: Normal rate and regular rhythm.     Pulses: Normal pulses.     Heart sounds: Normal heart sounds. No murmur.  Pulmonary:     Effort: Pulmonary effort is normal.     Breath sounds: Decreased breath sounds (Diminished breath sounds throughout exam) present. No wheezing or rales.  Musculoskeletal:     Right lower leg: No edema.     Left lower leg: No edema.  Lymphadenopathy:     Cervical: No cervical adenopathy.  Skin:    General: Skin is warm and dry.      Capillary Refill: Capillary refill takes less than 2 seconds.     Findings: No erythema or rash.  Neurological:     General: No focal deficit present.     Mental Status: He is alert and oriented to person, place, and time.     Motor: No weakness.     Coordination: Coordination normal.     Gait: Gait is intact. Gait normal.  Psychiatric:        Mood and Affect: Mood normal.        Behavior: Behavior normal. Behavior is cooperative.        Thought Content: Thought content normal.        Judgment: Judgment normal.     Lab Results:  CBC    Component Value Date/Time   WBC 5.4 02/13/2019 0603   RBC 4.07 (L) 02/13/2019 0603   HGB 12.0 (L) 02/13/2019 0603   HGB 13.3 06/25/2013 0822   HCT 38.8 (L) 02/13/2019 0603   HCT 39.8 06/25/2013 0822   PLT 169 02/13/2019 0603   PLT 165 06/25/2013 0822   MCV 95.3 02/13/2019 0603   MCV 89.4 06/25/2013 0822   MCH 29.5 02/13/2019 0603   MCHC 30.9 02/13/2019 0603   RDW 13.9 02/13/2019 0603   RDW 14.3 06/25/2013 0822   LYMPHSABS 1.0 02/13/2019 0603   LYMPHSABS 1.3 06/25/2013 0822   MONOABS 0.9 02/13/2019 0603   MONOABS 0.6 06/25/2013 0822   EOSABS 0.0 02/13/2019 0603   EOSABS 0.0 06/25/2013 0822   BASOSABS 0.0 02/13/2019 0603   BASOSABS 0.0 06/25/2013 0822    BMET    Component Value Date/Time   NA 139 02/13/2019 0603   NA 140 02/20/2013 1148   K 4.0 02/13/2019 0603   K 4.1 02/20/2013 1148   CL 104 02/13/2019 0603   CO2 27 02/13/2019 0603   CO2 26 02/20/2013 1148   GLUCOSE 95 02/13/2019 0603   GLUCOSE 89 02/20/2013 1148   BUN 25 (H) 02/13/2019 0603  BUN 12.8 02/20/2013 1148   CREATININE 0.73 02/13/2019 0603   CREATININE 0.8 02/20/2013 1148   CALCIUM 8.9 02/13/2019 0603   CALCIUM 9.2 02/20/2013 1148   GFRNONAA >60 02/13/2019 0603   GFRAA >60 02/13/2019 0603    BNP    Component Value Date/Time   BNP 47.6 12/24/2018 2344    ProBNP No results found for: PROBNP    Assessment & Plan:   COPD (chronic obstructive  pulmonary disease) (HCC) Plan: Continue prednisone 10 mg daily Continue Trelegy Ellipta inhaler Continue Daliresp 250 mcg daily >>> Contact our office in 2 weeks to let us know how you are doing -if you are tolerating nausea has subsided can increase Daliresp to 500 mcg daily in 2 weeks May need to consider azithromycin chronically for exacerbation prevention Continue oxygen therapy as prescribed Continue to work with home health care for further management of COPD Contact [facility] pharmacist as well as nurse for further medication assistance Follow-up in 4 weeks  Chronic respiratory failure with hypoxia ([place]) Plan: Continue oxygen therapy as prescribed  Medication management Plan: Telephone number provided today for contact information for [facility] pharmacist  Went through medication list with patient again today.  Reviewed this also with patient's daughter.  Patient to stop using Combivent.  Counseled patient on his overuse of albuterol.  Patient to be maintained on: Prednisone 10 mg daily Trelegy Ellipta Albuterol nebulized meds or rescue inhaler every 4 hours as needed Daliresp 250 mcg daily  Follow-up in 4 weeks    Return in about 4 weeks (around 04/02/2019), or if symptoms worsen or fail to improve, for Follow up with Wyn Quaker FNP-C, Follow up with Dr. Vaughan Browner.   Lauraine Rinne, NP 03/05/2019   This appointment was 56 minutes long with over 50% of the time in direct face-to-face patient care, assessment, plan of care, and follow-up.

## 2019-03-05 ENCOUNTER — Encounter: Payer: Self-pay | Admitting: Pulmonary Disease

## 2019-03-05 ENCOUNTER — Ambulatory Visit: Payer: Medicare HMO | Admitting: Pharmacist

## 2019-03-05 ENCOUNTER — Other Ambulatory Visit: Payer: Self-pay

## 2019-03-05 ENCOUNTER — Ambulatory Visit: Payer: Medicare HMO | Admitting: Pulmonary Disease

## 2019-03-05 VITALS — BP 152/82 | HR 77 | Temp 98.1°F | Ht 73.0 in | Wt 207.6 lb

## 2019-03-05 DIAGNOSIS — J441 Chronic obstructive pulmonary disease with (acute) exacerbation: Secondary | ICD-10-CM

## 2019-03-05 DIAGNOSIS — I10 Essential (primary) hypertension: Secondary | ICD-10-CM | POA: Diagnosis not present

## 2019-03-05 DIAGNOSIS — I2510 Atherosclerotic heart disease of native coronary artery without angina pectoris: Secondary | ICD-10-CM | POA: Diagnosis not present

## 2019-03-05 DIAGNOSIS — Z79899 Other long term (current) drug therapy: Secondary | ICD-10-CM

## 2019-03-05 DIAGNOSIS — N400 Enlarged prostate without lower urinary tract symptoms: Secondary | ICD-10-CM | POA: Diagnosis not present

## 2019-03-05 DIAGNOSIS — Z23 Encounter for immunization: Secondary | ICD-10-CM | POA: Diagnosis not present

## 2019-03-05 DIAGNOSIS — J9611 Chronic respiratory failure with hypoxia: Secondary | ICD-10-CM

## 2019-03-05 DIAGNOSIS — E538 Deficiency of other specified B group vitamins: Secondary | ICD-10-CM | POA: Diagnosis not present

## 2019-03-05 DIAGNOSIS — J439 Emphysema, unspecified: Secondary | ICD-10-CM | POA: Diagnosis not present

## 2019-03-05 DIAGNOSIS — R69 Illness, unspecified: Secondary | ICD-10-CM | POA: Diagnosis not present

## 2019-03-05 DIAGNOSIS — G4733 Obstructive sleep apnea (adult) (pediatric): Secondary | ICD-10-CM | POA: Diagnosis not present

## 2019-03-05 DIAGNOSIS — J45909 Unspecified asthma, uncomplicated: Secondary | ICD-10-CM | POA: Diagnosis not present

## 2019-03-05 DIAGNOSIS — E119 Type 2 diabetes mellitus without complications: Secondary | ICD-10-CM | POA: Diagnosis not present

## 2019-03-05 NOTE — Patient Instructions (Addendum)
STOP COMBIVENT now   Pneumovax23 today   Please call Jaclyn Shaggy the Pharmacist [phone]   Continue taking Prednisone 48m daily in the morning     Continue Daliresp 250 mcg daily >>>Prescription for 28 tablets >>> We can reevaluate at next office visit >>> If this medication is too expensive please do not pick this up  Call [place] 2 weeks to update [place] if doing well we will call in a new prescription for Daliresp 500 mcg daily   Continue Trelegy Ellipta  >>> 1 puff daily in the morning >>>rinse mouth out after use  >>> This inhaler contains 3 medications that help manage her respiratory status, contact our office if you cannot afford this medication or unable to remain on this medication  Only use your albuterol as a rescue medication to be used if you can't catch your breath by resting or doing a relaxed purse lip breathing pattern.  - The less you use it, the better it will work when you need it. - Ok to use up to 2 puffs every 4 hours if you must but call for immediate appointment if use goes up over your usual need - Don't leave home without it !! (think of it like the spare tire for your car)  Continue oxygen therapy as prescribed  >>>maintain oxygen saturations greater than 88 percent  >>>if unable to maintain oxygen saturations please contact the office  >>>do not smoke with oxygen  >>>can use nasal saline gel or nasal saline rinses to moisturize nose if oxygen causes dryness  Note your daily symptoms >remember "red flags" for COPD:  >>>Increase in cough >>>increase in sputum production >>>increase in shortness of breath or activity intolerance.   If you notice these symptoms, please call the office to be seen.     Please contact [facility] resources:   CR[place]o>[phone]  Patient Financial Assistance >[phone]    Return in about 4 weeks (around 04/02/2019), or if symptoms worsen or fail to improve, for  Follow up with BWyn QuakerFNP-C, Follow up with Dr. MVaughan Browner    Coronavirus (COVID-19) Are you at risk?  Are you at risk for the Coronavirus (COVID-19)?  To be considered HIGH RISK for Coronavirus (COVID-19), you have to meet the following criteria:  . Traveled to [place] or in the [place] and have fever, cough, and shortness of breath within the last 2 weeks of travel OR . Been in close contact with a person diagnosed with COVID-19 within the last 2 weeks and have fever, cough, and shortness of breath . IF YOU DO NOT MEET THESE CRITERIA, YOU ARE CONSIDERED LOW RISK FOR COVID-19.  What to do if you are HIGH RISK for COVID-19?  .[name] KitchenIf you are having a medical emergency, call 911. . Seek medical care right away. Before you go to a doctor's office, urgent care or emergency department, call ahead and tell them about your recent travel, contact with someone diagnosed with COVID-19, and your symptoms. You should receive instructions from your physician's office regarding next steps of care.  . When you arrive at healthcare provider, tell the healthcare staff immediately you have returned from visiting [place] or traveled in the [place] or [place] in the last two weeks or you have been in close contact with a person  diagnosed with COVID-19 in the last 2 weeks.   . Tell the health care staff about your symptoms: fever, cough and shortness of breath. . After you have been seen by a medical provider, you will be either: o Tested for (COVID-19) and discharged home on quarantine except to seek medical care if symptoms worsen, and asked to  - Stay home and avoid contact with others until you get your results (4-5 days)  - Avoid travel on public transportation if possible (such as bus, train, or airplane) or o Sent to the Emergency  Department by EMS for evaluation, COVID-19 testing, and possible admission depending on your condition and test results.  What to do if you are LOW RISK for COVID-19?  Reduce your risk of any infection by using the same precautions used for avoiding the common cold or flu:  [name] Kitchen Wash your hands often with soap and warm water for at least 20 seconds.  If soap and water are not readily available, use an alcohol-based hand sanitizer with at least 60% alcohol.  . If coughing or sneezing, cover your mouth and nose by coughing or sneezing into the elbow areas of your shirt or coat, into a tissue or into your sleeve (not your hands). . Avoid shaking hands with others and consider head nods or verbal greetings only. . Avoid touching your eyes, nose, or mouth with unwashed hands.  . Avoid close contact with people who are sick. . Avoid places or events with large numbers of people in one location, like concerts or sporting events. . Carefully consider travel plans you have or are making. . If you are planning any travel outside or inside the [place], visit the CDC's Travelers' Health webpage for the latest health notices. . If you have some symptoms but not all symptoms, continue to monitor at home and seek medical attention if your symptoms worsen. . If you are having a medical emergency, call 911.   [place] / e-Visit: [email or web address]         [facility]: [facility]: [phone]                   [facility]: [phone]           It is flu season:   >>> Best ways to protect herself from the flu: Receive the yearly flu vaccine, practice good hand hygiene washing with soap and also using hand sanitizer when available, eat a nutritious meals, get adequate rest, hydrate appropriately   Please contact the office if your symptoms worsen or  you have concerns that you are not improving.   Thank you for choosing [facility] for your healthcare, and for allowing [place] to partner with you on your healthcare journey. I am thankful to be able to provide care to you today.   Wyn Quaker FNP-C     COPD and Physical Activity Chronic obstructive pulmonary disease (COPD) is a long-term (chronic) condition that affects the lungs. COPD is a general term that can be used to describe many different lung problems that cause lung swelling (inflammation) and limit airflow, including chronic bronchitis and emphysema. The main symptom of COPD is shortness of breath, which makes it harder to do even simple tasks. This can also make it harder to exercise and be active. Talk with your health care provider about treatments to help you breathe better and actions you can take  to prevent breathing problems during physical activity. What are the benefits of exercising with COPD? Exercising regularly is an important part of a healthy lifestyle. You can still exercise and do physical activities even though you have COPD. Exercise and physical activity improve your shortness of breath by increasing blood flow (circulation). This causes your heart to pump more oxygen through your body. Moderate exercise can improve your:  Oxygen use.  Energy level.  Shortness of breath.  Strength in your breathing muscles.  Heart health.  Sleep.  Self-esteem and feelings of self-worth.  Depression, stress, and anxiety levels. Exercise can benefit everyone with COPD. The severity of your disease may affect how hard you can exercise, especially at first, but everyone can benefit. Talk with your health care provider about how much exercise is safe for you, and which activities and exercises are safe for you. What actions can I take to prevent breathing problems during physical activity?  Sign up for a pulmonary rehabilitation program. This type of program may  include: ? Education about lung diseases. ? Exercise classes that teach you how to exercise and be more active while improving your breathing. This usually involves:  Exercise using your lower extremities, such as a stationary bicycle.  About 30 minutes of exercise, 2 to 5 times per week, for 6 to 12 weeks  Strength training, such as push ups or leg lifts. ? Nutrition education. ? Group classes in which you can talk with others who also have COPD and learn ways to manage stress.  If you use an oxygen tank, you should use it while you exercise. Work with your health care provider to adjust your oxygen for your physical activity. Your resting flow rate is different from your flow rate during physical activity.  While you are exercising: ? Take slow breaths. ? Pace yourself and do not try to go too fast. ? Purse your lips while breathing out. Pursing your lips is similar to a kissing or whistling position. ? If doing exercise that uses a quick burst of effort, such as weight lifting:  Breathe in before starting the exercise.  Breathe out during the hardest part of the exercise (such as raising the weights). Where to find support You can find support for exercising with COPD from:  Your health care provider.  A pulmonary rehabilitation program.  Your local health department or community health programs.  Support groups, online or in-person. Your health care provider may be able to recommend support groups. Where to find more information You can find more information about exercising with COPD from:  American Lung Association: ClassInsider.se.  COPD Foundation: https://www.rivera.net/. Contact a health care provider if:  Your symptoms get worse.  You have chest pain.  You have nausea.  You have a fever.  You have trouble talking or catching your breath.  You want to start a new exercise program or a new activity. Summary  COPD is a general term that can be used to describe many  different lung problems that cause lung swelling (inflammation) and limit airflow. This includes chronic bronchitis and emphysema.  Exercise and physical activity improve your shortness of breath by increasing blood flow (circulation). This causes your heart to provide more oxygen to your body.  Contact your health care provider before starting any exercise program or new activity. Ask your health care provider what exercises and activities are safe for you. This information is not intended to replace advice given to you by your health care provider. Make  sure you discuss any questions you have with your health care provider. Document Released: 08/17/2017 Document Revised: 11/14/2018 Document Reviewed: 08/17/2017 Elsevier Patient Education  [place].    Exercises To Do While Sitting  Exercises that you do while sitting (chair exercises) can give you many of the same benefits as full exercise. Benefits include strengthening your heart, burning calories, and keeping muscles and joints healthy. Exercise can also improve your mood and help with depression and anxiety. You may benefit from chair exercises if you are unable to do standing exercises because of:  Diabetic foot pain.  Obesity.  Illness.  Arthritis.  Recovery from surgery or injury.  Breathing problems.  Balance problems.  Another type of disability. Before starting chair exercises, check with your health care provider or a physical therapist to find out how much exercise you can tolerate and which exercises are safe for you. If your health care provider approves:  Start out slowly and build up over time. Aim to work up to about 10-20 minutes for each exercise session.  Make exercise part of your daily routine.  Drink water when you exercise. Do not wait until you are thirsty. Drink every 10-15 minutes.  Stop exercising right away if you have pain, nausea, shortness of breath, or dizziness.  If you are  exercising in a wheelchair, make sure to lock the wheels.  Ask your health care provider whether you can do tai chi or yoga. Many positions in these mind-body exercises can be modified to do while seated. Warm-up Before starting other exercises: 1. Sit up as straight as you can. Have your knees bent at 90 degrees, which is the shape of the capital letter "L." Keep your feet flat on the floor. 2. Sit at the front edge of your chair, if you can. 3. Pull in (tighten) the muscles in your abdomen and stretch your spine and neck as straight as you can. Hold this position for a few minutes. 4. Breathe in and out evenly. Try to concentrate on your breathing, and relax your mind. Stretching Exercise A: Arm stretch 1. Hold your arms out straight in front of your body. 2. Bend your hands at the wrist with your fingers pointing up, as if signaling someone to stop. Notice the slight tension in your forearms as you hold the position. 3. Keeping your arms out and your hands bent, rotate your hands outward as far as you can and hold this stretch. Aim to have your thumbs pointing up and your pinkie fingers pointing down. Slowly repeat arm stretches for one minute as tolerated. Exercise B: Leg stretch 1. If you can move your legs, try to "draw" letters on the floor with the toes of your foot. Write your name with one foot. 2. Write your name with the toes of your other foot. Slowly repeat the movements for one minute as tolerated. Exercise C: Reach for the sky 1. Reach your hands as far over your head as you can to stretch your spine. 2. Move your hands and arms as if you are climbing a rope. Slowly repeat the movements for one minute as tolerated. Range of motion exercises Exercise A: Shoulder roll 1. Let your arms hang loosely at your sides. 2. Lift just your shoulders up toward your ears, then let them relax back down. 3. When your shoulders feel loose, rotate your shoulders in backward and forward  circles. Do shoulder rolls slowly for one minute as tolerated. Exercise B: March in place 1.  As if you are marching, pump your arms and lift your legs up and down. Lift your knees as high as you can. ? If you are unable to lift your knees, just pump your arms and move your ankles and feet up and down. March in place for one minute as tolerated. Exercise C: Seated jumping jacks 1. Let your arms hang down straight. 2. Keeping your arms straight, lift them up over your head. Aim to point your fingers to the ceiling. 3. While you lift your arms, straighten your legs and slide your heels along the floor to your sides, as wide as you can. 4. As you bring your arms back down to your sides, slide your legs back together. ? If you are unable to use your legs, just move your arms. Slowly repeat seated jumping jacks for one minute as tolerated. Strengthening exercises Exercise A: Shoulder squeeze 1. Hold your arms straight out from your body to your sides, with your elbows bent and your fists pointed at the ceiling. 2. Keeping your arms in the bent position, move them forward so your elbows and forearms meet in front of your face. 3. Open your arms back out as wide as you can with your elbows still bent, until you feel your shoulder blades squeezing together. Hold for 5 seconds. Slowly repeat the movements forward and backward for one minute as tolerated. Contact a health care provider if you:  Had to stop exercising due to any of the following: ? Pain. ? Nausea. ? Shortness of breath. ? Dizziness. ? Fatigue.  Have significant pain or soreness after exercising. Get help right away if you have:  Chest pain.  Difficulty breathing. These symptoms may represent a serious problem that is an emergency. Do not wait to see if the symptoms will go away. Get medical help right away. Call your local emergency services (911 in the [place] Do not drive yourself to the hospital. This information is not  intended to replace advice given to you by your health care provider. Make sure you discuss any questions you have with your health care provider. Document Released: 06/07/2017 Document Revised: 11/15/2018 Document Reviewed: 06/07/2017 Elsevier Patient Education  [place] Exercise  The sit-to-stand exercise (also known as the chair stand or chair rise exercise) strengthens your lower body and helps you maintain or improve your mobility and independence. The goal is to do the sit-to-stand exercise without using your hands. This will be easier as you become stronger. You should always talk with your health care provider before starting any exercise program, especially if you have had recent surgery. Do the exercise exactly as told by your health care provider and adjust it as directed. It is normal to feel mild stretching, pulling, tightness, or discomfort as you do this exercise, but you should stop right away if you feel sudden pain or your pain gets worse. Do not begin doing this exercise until told by your health care provider. What the sit-to-stand exercise does The sit-to-stand exercise helps to strengthen the muscles in your thighs and the muscles in the center of your body that give you stability (core muscles). This exercise is especially helpful if:  You have had knee or hip surgery.  You have trouble getting up from a chair, out of a car, or off the toilet. How to do the sit-to-stand exercise 1. Sit toward the front edge of a sturdy chair without armrests. Your knees should be bent and your  feet should be flat on the floor and shoulder-width apart. 2. Place your hands lightly on each side of the seat. Keep your back and neck as straight as possible, with your chest slightly forward. 3. Breathe in slowly. Lean forward and slightly shift your weight to the front of your feet. 4. Breathe out as you slowly stand up. Use your hands as little as possible. 5. Stand  and pause for a full breath in and out. 6. Breathe in as you sit down slowly. Tighten your core and abdominal muscles to control your lowering as much as possible. 7. Breathe out slowly. 8. Do this exercise 10-15 times. If needed, do it fewer times until you build up strength. 9. Rest for 1 minute, then do another set of 10-15 repetitions. To change the difficulty of the sit-to-stand exercise  If the exercise is too difficult, use a chair with sturdy armrests, and push off the armrests to help you come to the standing position. You can also use the armrests to help slowly lower yourself back to sitting. As this gets easier, try to use your arms less. You can also place a firm cushion or pillow on the chair to make the surface higher.  If this exercise is too easy, do not use your arms to help raise or lower yourself. You can also wear a weighted vest, use hand weights, increase your repetitions, or try a lower chair. General tips  You may feel tired when starting an exercise routine. This is normal.  You may have muscle soreness that lasts a few days. This is normal. As you get stronger, you may not feel muscle soreness.  Use smooth, steady movements.  Do not  hold your breath during strength exercises. This can cause unsafe changes in your blood pressure.  Breathe in slowly through your nose, and breathe out slowly through your mouth. Summary  Strengthening your lower body is an important step to help you move safely and independently.  The sit-to-stand exercise helps strengthen the muscles in your thighs and core.  You should always talk with your health care provider before starting any exercise program, especially if you have had recent surgery. This information is not intended to replace advice given to you by your health care provider. Make sure you discuss any questions you have with your health care provider. Document Released: 09/15/2016 Document Revised: 05/23/2018 Document  Reviewed: 09/15/2016 Elsevier Patient Education  [place].   Pneumococcal Vaccine, Polyvalent solution for injection What is this medicine? PNEUMOCOCCAL VACCINE, POLYVALENT (NEU mo KOK al vak SEEN, pol ee VEY luhnt) is a vaccine to prevent pneumococcus bacteria infection. These bacteria are a major cause of ear infections, Strep throat infections, and serious pneumonia, meningitis, or blood infections worldwide. These vaccines help the body to produce antibodies (protective substances) that help your body defend against these bacteria. This vaccine is recommended for people 1 years of age and older with health problems. It is also recommended for all adults over 26 years old. This vaccine will not treat an infection. This medicine may be used for other purposes; ask your health care provider or pharmacist if you have questions. COMMON BRAND NAME(S): Pneumovax 23 What should I tell my health care provider before I take this medicine? They need to know if you have any of these conditions:  bleeding problems  bone marrow or organ transplant  cancer, Hodgkin's disease  fever  infection  immune system problems  low platelet count in the blood  seizures  an unusual or allergic reaction to pneumococcal vaccine, diphtheria toxoid, other vaccines, latex, other medicines, foods, dyes, or preservatives  pregnant or trying to get pregnant  breast-feeding How should I use this medicine? This vaccine is for injection into a muscle or under the skin. It is given by a health care professional. A copy of Vaccine Information Statements will be given before each vaccination. Read this sheet carefully each time. The sheet may change frequently. Talk to your pediatrician regarding the use of this medicine in children. While this drug may be prescribed for children as young as 63 years of age for selected conditions, precautions do apply. Overdosage: If you think you have taken too much of  this medicine contact a poison control center or emergency room at once. NOTE: This medicine is only for you. Do not share this medicine with others. What if I miss a dose? It is important not to miss your dose. Call your doctor or health care professional if you are unable to keep an appointment. What may interact with this medicine?  medicines for cancer chemotherapy  medicines that suppress your immune function  medicines that treat or prevent blood clots like warfarin, enoxaparin, and dalteparin  steroid medicines like prednisone or cortisone This list may not describe all possible interactions. Give your health care provider a list of all the medicines, herbs, non-prescription drugs, or dietary supplements you use. Also tell them if you smoke, drink alcohol, or use illegal drugs. Some items may interact with your medicine. What should I watch for while using this medicine? Mild fever and pain should go away in 3 days or less. Report any unusual symptoms to your doctor or health care professional. What side effects may I notice from receiving this medicine? Side effects that you should report to your doctor or health care professional as soon as possible:  allergic reactions like skin rash, itching or hives, swelling of the face, lips, or tongue  breathing problems  confused  fever over 102 degrees F  pain, tingling, numbness in the hands or feet  seizures  unusual bleeding or bruising  unusual muscle weakness Side effects that usually do not require medical attention (report to your doctor or health care professional if they continue or are bothersome):  aches and pains  diarrhea  fever of 102 degrees F or less  headache  irritable  loss of appetite  pain, tender at site where injected  trouble sleeping This list may not describe all possible side effects. Call your doctor for medical advice about side effects. You may report side effects to FDA at  1-800-FDA-1088. Where should I keep my medicine? This does not apply. This vaccine is given in a clinic, pharmacy, doctor's office, or other health care setting and will not be stored at home. NOTE: This sheet is a summary. It may not cover all possible information. If you have questions about this medicine, talk to your doctor, pharmacist, or health care provider.  2020 Elsevier/Gold Standard (2008-02-29 14:32:37)

## 2019-03-05 NOTE — Assessment & Plan Note (Signed)
Plan: Telephone number provided today for contact information for [facility] pharmacist  Went through medication list with patient again today.  Reviewed this also with patient's daughter.  Patient to stop using Combivent.  Counseled patient on his overuse of albuterol.  Patient to be maintained on: Prednisone 10 mg daily Trelegy Ellipta Albuterol nebulized meds or rescue inhaler every 4 hours as needed Daliresp 250 mcg daily  Follow-up in 4 weeks

## 2019-03-05 NOTE — Assessment & Plan Note (Signed)
Plan: Continue prednisone 10 mg daily Continue Trelegy Ellipta inhaler Continue Daliresp 250 mcg daily >>> Contact our office in 2 weeks to let us know how you are doing -if you are tolerating nausea has subsided can increase Daliresp to 500 mcg daily in 2 weeks May need to consider azithromycin chronically for exacerbation prevention Continue oxygen therapy as prescribed Continue to work with home health care for further management of COPD Contact [facility] pharmacist as well as nurse for further medication assistance Follow-up in 4 weeks

## 2019-03-05 NOTE — Assessment & Plan Note (Signed)
Plan: Continue oxygen therapy as prescribed 

## 2019-03-06 ENCOUNTER — Other Ambulatory Visit: Payer: Self-pay | Admitting: Pharmacist

## 2019-03-06 DIAGNOSIS — J441 Chronic obstructive pulmonary disease with (acute) exacerbation: Secondary | ICD-10-CM | POA: Diagnosis not present

## 2019-03-06 NOTE — Patient Outreach (Signed)
[facility]) Care Management  [place] 03/06/2019  Cory Kemp [birth year] [id number]  Incoming call from patient's daughter.  HIPAA identifiers verified.  Daughter reports she accompanied patient to pulmonary visit yesterday where all respiratory medications were reviewed.   -NP reviewed with patient that he should not be taking Combivent.  Daughter has removed this medication from his supply.  She will also call [facility] pharmacy to de-activate prescription.  -NP counseled patient to reduce albuterol administration either via nebulizer or inhaler to only q6h PRN.  -NP ordered new tubing supplies for nebulizer machine -Patient started Daliresp, experiencing nausea.  Will trial a few more weeks.  If tolerates, will try increased dose.    Reviewed medication management strategies with daughter to help patient improve adherence.  Patient easily confused at the pharmacy.  Daughter will start to be more in charge of picking up all medications instead of the patient so she can make sure medications are being filled regularly.  She will try to check patient's medication bag when she sees him weekly.  She will also help counsel patient to take his daily medications at home either in the morning or before bed so he does not have to carry his medications around with him during the day or leave them in his car with this heat.    Patient already receiving Full Extra Help benefits on co-pays.  All brand name medications including Trelegy and Daliresp should be $8.95.  Suggested that patient could try to fill 90 day supplies for medications for further cost-savings.    Successful call with patient to also review medications again.  Patient very appreciative of his daughter's assistance.  He is agreeable for her to start to manage his refills more. He confirmed understanding of albuterol dosing.  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    Plan: F/u again with patient and daughter in 1-2 weeks re: medication adherence  Ralene Bathe, PharmD, [place] [phone]

## 2019-03-07 ENCOUNTER — Ambulatory Visit: Payer: Self-pay | Admitting: Pharmacist

## 2019-03-07 DIAGNOSIS — R69 Illness, unspecified: Secondary | ICD-10-CM | POA: Diagnosis not present

## 2019-03-07 DIAGNOSIS — E119 Type 2 diabetes mellitus without complications: Secondary | ICD-10-CM | POA: Diagnosis not present

## 2019-03-07 DIAGNOSIS — G4733 Obstructive sleep apnea (adult) (pediatric): Secondary | ICD-10-CM | POA: Diagnosis not present

## 2019-03-07 DIAGNOSIS — J45909 Unspecified asthma, uncomplicated: Secondary | ICD-10-CM | POA: Diagnosis not present

## 2019-03-07 DIAGNOSIS — I10 Essential (primary) hypertension: Secondary | ICD-10-CM | POA: Diagnosis not present

## 2019-03-07 DIAGNOSIS — N400 Enlarged prostate without lower urinary tract symptoms: Secondary | ICD-10-CM | POA: Diagnosis not present

## 2019-03-07 DIAGNOSIS — J9611 Chronic respiratory failure with hypoxia: Secondary | ICD-10-CM | POA: Diagnosis not present

## 2019-03-07 DIAGNOSIS — E538 Deficiency of other specified B group vitamins: Secondary | ICD-10-CM | POA: Diagnosis not present

## 2019-03-07 DIAGNOSIS — J439 Emphysema, unspecified: Secondary | ICD-10-CM | POA: Diagnosis not present

## 2019-03-07 DIAGNOSIS — I2510 Atherosclerotic heart disease of native coronary artery without angina pectoris: Secondary | ICD-10-CM | POA: Diagnosis not present

## 2019-03-08 ENCOUNTER — Telehealth: Payer: Self-pay | Admitting: Pharmacist

## 2019-03-08 ENCOUNTER — Telehealth: Payer: Self-pay | Admitting: Pulmonary Disease

## 2019-03-08 DIAGNOSIS — J449 Chronic obstructive pulmonary disease, unspecified: Secondary | ICD-10-CM | POA: Diagnosis not present

## 2019-03-08 DIAGNOSIS — J441 Chronic obstructive pulmonary disease with (acute) exacerbation: Secondary | ICD-10-CM | POA: Diagnosis not present

## 2019-03-08 DIAGNOSIS — J438 Other emphysema: Secondary | ICD-10-CM

## 2019-03-08 NOTE — Telephone Encounter (Signed)
Referral from Wyn Quaker, FNP to review medications and medication education.  He is frequently in and out of the hospital making it difficult for him to keep up with his medication regimen.  Will schedule appointment when pharmacy schedule is available.   Mariella Saa, PharmD, [place], [place] Clinical Specialty Pharmacist [phone]  03/08/2019 9:06 AM

## 2019-03-08 NOTE — Telephone Encounter (Signed)
Order placed for pulmonary rehab in Dr. Harlow Asa name.

## 2019-03-08 NOTE — Telephone Encounter (Signed)
03/08/2019 1636  Triage,  Please place a order for pulmonary rehab for the patient.  Can use diagnosis of emphysema as well as chronic respiratory failure for support.  Remember this needs to be placed under Dr. Matilde Bash name.  Wyn Quaker, FNP

## 2019-03-11 ENCOUNTER — Telehealth (HOSPITAL_COMMUNITY): Payer: Self-pay

## 2019-03-11 DIAGNOSIS — J45909 Unspecified asthma, uncomplicated: Secondary | ICD-10-CM | POA: Diagnosis not present

## 2019-03-11 DIAGNOSIS — I10 Essential (primary) hypertension: Secondary | ICD-10-CM | POA: Diagnosis not present

## 2019-03-11 DIAGNOSIS — I2510 Atherosclerotic heart disease of native coronary artery without angina pectoris: Secondary | ICD-10-CM | POA: Diagnosis not present

## 2019-03-11 DIAGNOSIS — J9611 Chronic respiratory failure with hypoxia: Secondary | ICD-10-CM | POA: Diagnosis not present

## 2019-03-11 DIAGNOSIS — E538 Deficiency of other specified B group vitamins: Secondary | ICD-10-CM | POA: Diagnosis not present

## 2019-03-11 DIAGNOSIS — G4733 Obstructive sleep apnea (adult) (pediatric): Secondary | ICD-10-CM | POA: Diagnosis not present

## 2019-03-11 DIAGNOSIS — J439 Emphysema, unspecified: Secondary | ICD-10-CM | POA: Diagnosis not present

## 2019-03-11 DIAGNOSIS — N400 Enlarged prostate without lower urinary tract symptoms: Secondary | ICD-10-CM | POA: Diagnosis not present

## 2019-03-11 DIAGNOSIS — E119 Type 2 diabetes mellitus without complications: Secondary | ICD-10-CM | POA: Diagnosis not present

## 2019-03-11 DIAGNOSIS — R69 Illness, unspecified: Secondary | ICD-10-CM | POA: Diagnosis not present

## 2019-03-11 NOTE — Telephone Encounter (Signed)
Pt insurance is active and benefits verified through [place] $30, DED 0/0 met, out of pocket $4,200/$933.68 met, co-insurance 0. no pre-authorization required, REF# [id number]  Will contact patient to see if he is interested in the Cardiac Rehab Program. If interested, patient will need to complete follow up appt. Once completed, patient will be contacted for scheduling upon review by the RN Navigator.

## 2019-03-12 ENCOUNTER — Encounter (HOSPITAL_COMMUNITY): Payer: Self-pay | Admitting: *Deleted

## 2019-03-12 ENCOUNTER — Other Ambulatory Visit: Payer: Self-pay | Admitting: *Deleted

## 2019-03-12 DIAGNOSIS — G4733 Obstructive sleep apnea (adult) (pediatric): Secondary | ICD-10-CM | POA: Diagnosis not present

## 2019-03-12 DIAGNOSIS — J45909 Unspecified asthma, uncomplicated: Secondary | ICD-10-CM | POA: Diagnosis not present

## 2019-03-12 DIAGNOSIS — E119 Type 2 diabetes mellitus without complications: Secondary | ICD-10-CM | POA: Diagnosis not present

## 2019-03-12 DIAGNOSIS — J9611 Chronic respiratory failure with hypoxia: Secondary | ICD-10-CM | POA: Diagnosis not present

## 2019-03-12 DIAGNOSIS — E538 Deficiency of other specified B group vitamins: Secondary | ICD-10-CM | POA: Diagnosis not present

## 2019-03-12 DIAGNOSIS — R69 Illness, unspecified: Secondary | ICD-10-CM | POA: Diagnosis not present

## 2019-03-12 DIAGNOSIS — J439 Emphysema, unspecified: Secondary | ICD-10-CM | POA: Diagnosis not present

## 2019-03-12 DIAGNOSIS — N400 Enlarged prostate without lower urinary tract symptoms: Secondary | ICD-10-CM | POA: Diagnosis not present

## 2019-03-12 DIAGNOSIS — I2510 Atherosclerotic heart disease of native coronary artery without angina pectoris: Secondary | ICD-10-CM | POA: Diagnosis not present

## 2019-03-12 DIAGNOSIS — I10 Essential (primary) hypertension: Secondary | ICD-10-CM | POA: Diagnosis not present

## 2019-03-12 NOTE — Patient Outreach (Signed)
[facility]) Care Management  03/12/2019  Cory Kemp 11/06/1940 [id number]   Call placed to member's daughter to follow up on management of member's care, no answer.  HIPAA compliant voice message left.  Call then placed to member.  He report he's "doing alright.  My breathing has been off and on."  State breathing is much better once he is up and moving about.  Report he is taking medications and inhalers as instructed at last pulmonology visit, nausea has improved in regards to [place].  Using Nebulizer twice a day and rescue inhaler 3-4 times a day.  State he has PT that has been ordered and will have visit in the home today for his first evaluation.  Denies any urgent concerns at this time, will follow up with member and daughter within the next 2 weeks.  THN CM Care Plan Problem One     Most Recent Value  Care Plan Problem One  Risk for readmission related to COPD management as evidenced by recent hospitalization  Role Documenting the Problem One  Care Management [place] for Problem One  Active  THN Long Term Goal   Member will not be readmitted to hospital within the next 31 days  THN Long Term Goal Start Date  02/19/19  Interventions for Problem One Long Term Goal  Re-educated on complications of poorly controlled COPD  THN CM Short Term Goal #1   Member/daughter will report plan for increased support and plan to clean/fix home within the next 3 weeks  THN CM Short Term Goal #1 Start Date  02/19/19  Interventions for Short Term Goal #1  Message left for daughter to return call, educated member on importance of follow up with community resources in effort to make home safer  [facility] CM Short Term Goal #2   Member will report taking medications as instructed over the next 4 weeks  THN CM Short Term Goal #2 Start Date  02/19/19  Interventions for Short Term Goal #2  Reviewed medication changes and importance of taking as instructed     [name], RN,  MSN [place] Manager [phone]

## 2019-03-12 NOTE — Progress Notes (Addendum)
Referral received for this pt to participate in Pulmonary Rehab with diagnosis of Other Emphysema and Respiratory Failure by Dr. Vaughan Browner. Clinical review of pt follow up appt on 03/05/19 with Wyn Quaker NP with Dr. Vaughan Browner.  Pulmonary office note. Pt with Covid Risk Score 6.   Pt appropriate for scheduling for Pulmonary rehab.  Will forward to support staff for scheduling and verification of insurance eligibility/benefits with pt  consent. Cherre Huger, BSN Cardiac and Training and development officer

## 2019-03-13 ENCOUNTER — Telehealth: Payer: Self-pay | Admitting: Pulmonary Disease

## 2019-03-13 NOTE — Telephone Encounter (Signed)
Left message for Cory Kemp to call back.   There is an order in patient's chart for pulmonary rehab at home but not OT.

## 2019-03-14 DIAGNOSIS — J439 Emphysema, unspecified: Secondary | ICD-10-CM | POA: Diagnosis not present

## 2019-03-14 DIAGNOSIS — I2510 Atherosclerotic heart disease of native coronary artery without angina pectoris: Secondary | ICD-10-CM | POA: Diagnosis not present

## 2019-03-14 DIAGNOSIS — G4733 Obstructive sleep apnea (adult) (pediatric): Secondary | ICD-10-CM | POA: Diagnosis not present

## 2019-03-14 DIAGNOSIS — E119 Type 2 diabetes mellitus without complications: Secondary | ICD-10-CM | POA: Diagnosis not present

## 2019-03-14 DIAGNOSIS — J9611 Chronic respiratory failure with hypoxia: Secondary | ICD-10-CM | POA: Diagnosis not present

## 2019-03-14 DIAGNOSIS — E538 Deficiency of other specified B group vitamins: Secondary | ICD-10-CM | POA: Diagnosis not present

## 2019-03-14 DIAGNOSIS — J45909 Unspecified asthma, uncomplicated: Secondary | ICD-10-CM | POA: Diagnosis not present

## 2019-03-14 DIAGNOSIS — I10 Essential (primary) hypertension: Secondary | ICD-10-CM | POA: Diagnosis not present

## 2019-03-14 DIAGNOSIS — R69 Illness, unspecified: Secondary | ICD-10-CM | POA: Diagnosis not present

## 2019-03-14 DIAGNOSIS — N400 Enlarged prostate without lower urinary tract symptoms: Secondary | ICD-10-CM | POA: Diagnosis not present

## 2019-03-14 NOTE — Telephone Encounter (Signed)
Called and spoke with Anne Ng from [place]. Stated to pt that I see where we have a note for an order to be placed for pt to have pulm rehab but not seeing anything in regards to occupational therapy. Anne Ng stated when pt was discharged from hospital, there was an order placed for pt to have occupational therapy. Anne Ng said they were supposed to start it last week but pt refused to have it started last week and wanted to begin it this week instead.  Anne Ng is asking for an order to be placed for pt to begin occupational therapy. Aaron Edelman, please advise if you are okay with [place] placing this order as therapy is supposed to be starting this week. Thanks!

## 2019-03-14 NOTE — Telephone Encounter (Signed)
Yes I am okay with this.  Patient also needs to be established with primary care outpatient.  Primary care really should be managing this chronically for patient.  I have forms at my desk that I have signed as of 03/14/2019 that need to be faxed to [facility] regarding OT eval.  Wyn Quaker, FNP

## 2019-03-15 NOTE — Telephone Encounter (Signed)
Scheduled on 8/12 @ 10AM

## 2019-03-15 NOTE — Telephone Encounter (Signed)
LMTCB x1 for Annette.

## 2019-03-17 ENCOUNTER — Other Ambulatory Visit: Payer: Self-pay | Admitting: Pulmonary Disease

## 2019-03-17 DIAGNOSIS — J441 Chronic obstructive pulmonary disease with (acute) exacerbation: Secondary | ICD-10-CM

## 2019-03-18 ENCOUNTER — Other Ambulatory Visit: Payer: Self-pay | Admitting: Pharmacist

## 2019-03-18 ENCOUNTER — Ambulatory Visit: Payer: Self-pay | Admitting: Pharmacist

## 2019-03-18 ENCOUNTER — Telehealth: Payer: Self-pay | Admitting: Pharmacist

## 2019-03-18 NOTE — Telephone Encounter (Signed)
Received message from pharmacist at [facility] stating patient and his daughter had some questions about his upcoming appointment with the pharmacy team.  Left voicemail stating appointment would be in person and to bring all OTC and prescription medications.  Instructed to call back if they would prefer a virtual visit.  Mariella Saa, PharmD, [place], [place] Clinical Specialty Pharmacist [phone]  03/18/2019 4:11 PM

## 2019-03-18 NOTE — Telephone Encounter (Signed)
Attempted to call Anne Ng but unable to reach. Left message for her to return call.

## 2019-03-18 NOTE — Patient Outreach (Signed)
[facility]) Care Management  [place] 03/18/2019  Cory Kemp [birth year] [id number]  Reason for call: f/u on medication adherence / medication management  Noted per [facility] RN notes that patient is doing better with Daliresp and nausea has resolved.    Successful call to patient's daughter.  She reports that patient has been doing "very well" with medication adherence the last 2 weeks.  He is much more willing to allow her to assist with prescription refills at the pharmacy.  He called her this morning to tell her that 2 medications are ready to be picked up and he requested that she make sure they are the "right medications."  Daughter is very pleased with current situation.  We reviewed that patient has an appointment on Wed with pharmacy at the pulmonary clinic to also review medication management.  She has taken off work to join patient but isn't sure if this is a face-to-face or virtual appt.  I will reach out to pharmacists and ask them to confirm with patient's daughter.   Successful call with patient.  He states he has trouble in the morning at first getting his oxygen level up but it levels out once he "gets going." He states he is taking the albuterol about 4x daily now (previously 10x daily)  but doesn't think it works as quickly as it used to.  We reviewed administration technique for inhaler vs nebulizer.  He states he is doing his exercises for breathing and is trying to do these 2x daily now.  He thinks that the nausea from [place] has improved but he still "has to be careful" with it.   He thinks he has finished up the Daliresp 270mcg tablets but has not started the increased dose.  Per EMR, order placed 02/19/19 for 28 day supply of Daliresp, NP noted if patient tolerated, would increase dose.  No new order for Daliresp 558mcg in EMR. Will reach out to pulmonary office to inquire / request new order if needed.   Plan: -F/U again with patient and daughter in 2  weeks for medication mgmt.  If patient continues to do well, will close [place], PharmD, [place] [phone]

## 2019-03-18 NOTE — Telephone Encounter (Signed)
Cory Kemp returning call and can be reached @ [phone].Hillery Hunter

## 2019-03-18 NOTE — Telephone Encounter (Signed)
Attempted to call Annette with [facility] but unable to reach. Left message for her to return call.

## 2019-03-19 DIAGNOSIS — E119 Type 2 diabetes mellitus without complications: Secondary | ICD-10-CM | POA: Diagnosis not present

## 2019-03-19 DIAGNOSIS — E538 Deficiency of other specified B group vitamins: Secondary | ICD-10-CM | POA: Diagnosis not present

## 2019-03-19 DIAGNOSIS — G4733 Obstructive sleep apnea (adult) (pediatric): Secondary | ICD-10-CM | POA: Diagnosis not present

## 2019-03-19 DIAGNOSIS — I10 Essential (primary) hypertension: Secondary | ICD-10-CM | POA: Diagnosis not present

## 2019-03-19 DIAGNOSIS — I2510 Atherosclerotic heart disease of native coronary artery without angina pectoris: Secondary | ICD-10-CM | POA: Diagnosis not present

## 2019-03-19 DIAGNOSIS — R69 Illness, unspecified: Secondary | ICD-10-CM | POA: Diagnosis not present

## 2019-03-19 DIAGNOSIS — J45909 Unspecified asthma, uncomplicated: Secondary | ICD-10-CM | POA: Diagnosis not present

## 2019-03-19 DIAGNOSIS — J439 Emphysema, unspecified: Secondary | ICD-10-CM | POA: Diagnosis not present

## 2019-03-19 DIAGNOSIS — J9611 Chronic respiratory failure with hypoxia: Secondary | ICD-10-CM | POA: Diagnosis not present

## 2019-03-19 DIAGNOSIS — N400 Enlarged prostate without lower urinary tract symptoms: Secondary | ICD-10-CM | POA: Diagnosis not present

## 2019-03-19 NOTE — Telephone Encounter (Signed)
Called and spoke to [place] and informed her of the 'ok' per Aaron Edelman. She states she will write up the order and fax it to our office.   Called and spoke to pt. Pt states he sees Dr. Jani Gravel for PCP and is aware that in the future PCP should be signing off on this.   Nothing further needed at this time. Will forward to [place] as an [place].

## 2019-03-20 ENCOUNTER — Other Ambulatory Visit: Payer: Self-pay

## 2019-03-20 ENCOUNTER — Ambulatory Visit (INDEPENDENT_AMBULATORY_CARE_PROVIDER_SITE_OTHER): Payer: Medicare HMO | Admitting: Pulmonary Disease

## 2019-03-20 ENCOUNTER — Telehealth: Payer: Self-pay | Admitting: Pulmonary Disease

## 2019-03-20 DIAGNOSIS — J9611 Chronic respiratory failure with hypoxia: Secondary | ICD-10-CM

## 2019-03-20 DIAGNOSIS — J441 Chronic obstructive pulmonary disease with (acute) exacerbation: Secondary | ICD-10-CM | POA: Diagnosis not present

## 2019-03-20 MED ORDER — FLUTICASONE PROPIONATE 50 MCG/ACT NA SUSP: 2.0000 | Freq: Every day | NASAL | 2 refills | Status: DC

## 2019-03-20 MED ORDER — DALIRESP 250 MCG PO TABS: 1.0000 | ORAL_TABLET | Freq: Every morning | ORAL | 0 refills | Status: DC

## 2019-03-20 NOTE — Telephone Encounter (Signed)
03/20/2019 1257  Denise,  Can you contact the pt and see how he is doing. He saw pharmacy today and seemed to be stable on Daliresp 279mcg.  We can consider leaving the patient on Daliresp 250 mcg daily for the next 4 weeks to see how he tolerates and tolerates his adjustments with Lexapro from primary care if they do make changes.  Or if family and the patient would like to go ahead and increased Allerest to full dosing of 500 mcg daily that we can send a prescription for that.  If the patient does want maintained on Daliresp 250 mcg daily please send a prescription for Daliresp 250 mcg 30 tablets, 0 refills.  This can be further evaluated next office visit with Dr. Vaughan Browner.  At the patient would like to increase Daliresp to full dosing then please send a prescription of Daliresp 500 mcg daily with 3 refills.  Wyn Quaker FNP

## 2019-03-20 NOTE — Telephone Encounter (Signed)
Contacted patient re: Cory Kemp.  Advised B. Warner Mccreedy, NP asked [place] to follow up with patient to see how he is doing on this medication and if he feels he needs a higher dose or a change in medication.  Patient states he feels he is 'doing pretty good' and would like to continue on the same dose. He does feel some better and would like to continue this a few more weeks.  Refill sent to [facility] and verified this with patient.    Advised patient if he feels he is doing worse and would like to discuss the other options further, to give [place] a call. Otherwise he will follow up at next scheduled visit in a few weeks. Nothing further needed.

## 2019-03-20 NOTE — Telephone Encounter (Signed)
-----   Message from Forde Dandy, PharmD sent at 03/20/2019 12:23 PM EDT ----- Porfirio Mylar, information only. Hope this helps---would be happy to work with patient again in the future if needed. Thank you!

## 2019-03-20 NOTE — Progress Notes (Addendum)
S: Cory Kemp is a 78 y.o. male reports to clinical pharmacist appointment for medication review per pulmonary team consult. Patient did  bring medication bottles. Patient is accompanied by daughter, who assists at home with medication management.  Allergies  Allergen Reactions  . Adhesive [Tape] Rash    No "PLASTIC" tape!!! Patient broke out in blisters!!    Current Outpatient Medications:  .  albuterol (PROVENTIL) (2.5 MG/3ML) 0.083% nebulizer solution, Take 3 mLs (2.5 mg total) by nebulization every 6 (six) hours as needed for wheezing or shortness of breath., Disp: 75 mL, Rfl: 3 .  albuterol (VENTOLIN HFA) 108 (90 Base) MCG/ACT inhaler, Inhale 2 puffs into the lungs every 4 (four) hours as needed for wheezing or shortness of breath., Disp: 1 Inhaler, Rfl: 3 .  Cholecalciferol (VITAMIN D-3) 25 MCG (1000 UT) CAPS, Take 1,000 Units by mouth at bedtime., Disp: , Rfl:  .  Cyanocobalamin (VITAMIN B-12) 5000 MCG TBDP, Take 5,000 mcg by mouth daily. , Disp: , Rfl:  .  DALIRESP 250 MCG TABS, TAKE 1 TABLET BY MOUTH EVERY MORNING, Disp: 28 tablet, Rfl: 0 .  escitalopram (LEXAPRO) 5 MG tablet, Take 5 mg by mouth daily., Disp: , Rfl:  .  guaiFENesin (MUCINEX) 600 MG 12 hr tablet, Take 1 tablet (600 mg total) by mouth 2 (two) times daily. (Patient not taking: Reported on 03/06/2019), Disp: 30 tablet, Rfl: 0 .  ibuprofen (ADVIL) 200 MG tablet, Take 400 mg by mouth at bedtime., Disp: , Rfl:  .  losartan (COZAAR) 50 MG tablet, Take 50 mg by mouth at bedtime. , Disp: , Rfl:  .  OXYGEN, Inhale 4 L into the lungs continuous., Disp: , Rfl:  .  oxymetazoline (AFRIN NODRIP SEVERE CONGEST) 0.05 % nasal spray, Place 1 spray into both nostrils every 3 (three) hours., Disp: , Rfl:  .  predniSONE (DELTASONE) 10 MG tablet, Take 30mg  (three tabs) on day one , then 20mg  ( two tabs) on day two, then 10mg  (one tab) on day three, then stop. (Patient taking differently: Take 10 mg by mouth daily with breakfast. Take 30mg   (three tabs) on day one , then 20mg  ( two tabs) on day two, then 10mg  (one tab) on day three, then stop.), Disp: 6 tablet, Rfl: 0 .  simvastatin (ZOCOR) 40 MG tablet, Take 40 mg by mouth at bedtime. , Disp: , Rfl:  .  TRELEGY ELLIPTA 100-62.5-25 MCG/INH AEPB, INHALE 1 PUFF BY MOUTH EVERY DAY., Disp: 60 each, Rfl: 2 Past Medical History:  Diagnosis Date  . Alcohol abuse   . Anemia   . Arthritis   . Asthma   . B12 deficiency 11/03/2013  . BPH (benign prostatic hypertrophy)   . COPD (chronic obstructive pulmonary disease) ([place])   . Dementia ([place])    ?  [name] Kitchen Diabetes mellitus ([place]) 01/10/2012  . Femoral hernia, right 03/27/2012  . Glucose intolerance (impaired glucose tolerance)   . Hyperlipidemia   . Hypertension   . Incisional hernias - swiss-cheese-type periumbilical 08/15/8414  . Inguinal hernia - right 01/10/2012  . Leukopenia 01/29/2013  . Monocytosis 01/29/2013  . Normochromic anemia 01/29/2013  . Osteopenia    Social History   Socioeconomic History  . Marital status: Widowed    Spouse name: Not on file  . Number of children: 2  . Years of education: Not on file  . Highest education level: Not on file  Occupational History  . Occupation: Retired    Comment: International aid/development worker  .  Financial resource strain: Somewhat hard  . Food insecurity    Worry: Sometimes true    Inability: Sometimes true  . Transportation needs    Medical: No    Non-medical: No  Tobacco Use  . Smoking status: Former Smoker    Packs/day: 1.00    Years: 63.00    Pack years: 63.00    Types: Cigarettes    Start date: 08/09/1955    Quit date: 08/08/2018    Years since quitting: 0.6  . Smokeless tobacco: Never Used  . Tobacco comment: reports he stopped at the beginning of this year  Substance and Sexual Activity  . Alcohol use: No    Alcohol/week: 0.0 standard drinks  . Drug use: No  . Sexual activity: Not on file  Lifestyle  . Physical activity    Days per week: 0 days    Minutes per session: 0 min   . Stress: Only a little  Relationships  . Social Herbalist on phone: Not on file    Gets together: Not on file    Attends religious service: Not on file    Active member of club or organization: Not on file    Attends meetings of clubs or organizations: Not on file    Relationship status: Not on file  Other Topics Concern  . Not on file  Social History Narrative  . Not on file   Family History  Problem Relation Age of Onset  . Lung cancer Father   . Lung cancer Mother   . Diabetes Brother    O:   Ht Readings from Last 2 Encounters:  03/05/19 6\' 1"  (1.854 m)  02/19/19 6\' 1"  (1.854 m)   Wt Readings from Last 2 Encounters:  03/05/19 207 lb 9.6 oz (94.2 kg)  02/19/19 212 lb 6.4 oz (96.3 kg)   There is no height or weight on file to calculate BMI. BP Readings from Last 3 Encounters:  03/05/19 (!) 152/82  02/19/19 130/80  02/13/19 (!) 165/54   A/P: A drug regimen assessment was performed and medications were reviewed with the patient and daughter, including name, instructions, indication, goals of therapy, potential side effects, importance of adherence, and safe use.  Current COPD regimen includes Trelegy Ellipta, DUONEB (ipratropium-albuterol) about twice daily, and uses HFA albuterol as rescue when he is away from home, prednisone 10 mg daily, and Daliresp 250 mcg daily.   We discussed maintenance vs. rescue inhaler, dosing, preparation, administration, rinsing mouth after steroid inhaler use. Educated patient further regarding Trelegy Ellipta---he states he has only been taking once-daily recently (was taking as needed in the past). Today, patient confirms understanding of correct Trelegy instructions by demonstration and repeat back. Patient and daughter state he hasn't noticed a significant difference in symptoms since starting Trelegy, advised him once inhaler is open, leave open and take 2-3 puffs to get full dose. He is also initiated on Daliresp and reports  nausea is lessening but would like to hold off on increasing the Daliresp dose until follow up appointment in 2 weeks.  Notably, patient and daughter report he has been using Afrin 2-4 times daily for decades. He is likely experiencing rebound congestion from this agent which may be contributing to overall COPD control. He wears Breathe Right nasal strips every day throughout the day. Advised patient to initiate fluticasone nasal spray (sent prescription with instructions), while tapering off of Afrin (may take longer than a few weeks due to being on Afrin for many  years). Patient and daughter verbalized understanding.  Patient's daughter states his COPD symptoms may be aggravated by anxiety and is interested in increasing Lexapro (escitalopram) dose. Will send note to PCP.  No further therapy changes are recommended at this time, but can consider switch to [facility] triple therapy once available, as this may be a more feasible device with patient and can use a spacer.  Patient did  express understanding by repeating back information discussed and correctly demonstrating inhaler technique. Patient may benefit from further inhaler education in the future and was advised to contact me if further concerns arise.  Thank you for involving me in the care of this patient. Please feel free to include me again in the future if needed.

## 2019-03-20 NOTE — Patient Instructions (Signed)
It was a pleasure meeting with you today! I will share our discussion with the team.  Continue taking Trelegy and prednisone daily, with albuterol as needed for rescue. Start fluticasone nasal spray as discussed, decrease Afrin use with the goal of stopping Afrin.  Please contact me if you have any questions.

## 2019-03-21 ENCOUNTER — Other Ambulatory Visit: Payer: Self-pay

## 2019-03-21 DIAGNOSIS — I2510 Atherosclerotic heart disease of native coronary artery without angina pectoris: Secondary | ICD-10-CM | POA: Diagnosis not present

## 2019-03-21 DIAGNOSIS — N400 Enlarged prostate without lower urinary tract symptoms: Secondary | ICD-10-CM | POA: Diagnosis not present

## 2019-03-21 DIAGNOSIS — G4733 Obstructive sleep apnea (adult) (pediatric): Secondary | ICD-10-CM | POA: Diagnosis not present

## 2019-03-21 DIAGNOSIS — J45909 Unspecified asthma, uncomplicated: Secondary | ICD-10-CM | POA: Diagnosis not present

## 2019-03-21 DIAGNOSIS — E119 Type 2 diabetes mellitus without complications: Secondary | ICD-10-CM | POA: Diagnosis not present

## 2019-03-21 DIAGNOSIS — I10 Essential (primary) hypertension: Secondary | ICD-10-CM | POA: Diagnosis not present

## 2019-03-21 DIAGNOSIS — E538 Deficiency of other specified B group vitamins: Secondary | ICD-10-CM | POA: Diagnosis not present

## 2019-03-21 DIAGNOSIS — J9611 Chronic respiratory failure with hypoxia: Secondary | ICD-10-CM | POA: Diagnosis not present

## 2019-03-21 DIAGNOSIS — R69 Illness, unspecified: Secondary | ICD-10-CM | POA: Diagnosis not present

## 2019-03-21 DIAGNOSIS — J439 Emphysema, unspecified: Secondary | ICD-10-CM | POA: Diagnosis not present

## 2019-03-21 NOTE — Patient Outreach (Signed)
[facility]) Care Management  03/21/2019  Cory Kemp 1940/12/12 [id number]   Successful follow up call to patient's daughter today.   During initial outreach, BSW and daughter discussed resources to possibly assist with replacing water heater at patient's home.  Referrals were submitted to [place].   [place] from [place], [place] able to provide this particular type of assistance.   Attempted to contact [facility] multiple times without success. Daughter was provided with contact information for [facility] during initial outreach, however, BSW has since learned that they do not provide assistance for people that reside within the [name].  Today BSW provided daughter with contact information for [facility] through the [place] as well as [employer].  Daughter reported that she recently went to patient's home and is concerned about flooring in certain areas of the home.  BSW agreed to contact [place] again to see if [place] can assist with this type of repair.  Secure message sent to [facility] with [place].  Will follow up with daughter when response is received.   Ronn Melena, BSW Social Worker [phone]

## 2019-03-22 DIAGNOSIS — E119 Type 2 diabetes mellitus without complications: Secondary | ICD-10-CM | POA: Diagnosis not present

## 2019-03-22 DIAGNOSIS — E7800 Pure hypercholesterolemia, unspecified: Secondary | ICD-10-CM | POA: Diagnosis not present

## 2019-03-22 DIAGNOSIS — E538 Deficiency of other specified B group vitamins: Secondary | ICD-10-CM | POA: Diagnosis not present

## 2019-03-22 DIAGNOSIS — I2510 Atherosclerotic heart disease of native coronary artery without angina pectoris: Secondary | ICD-10-CM | POA: Diagnosis not present

## 2019-03-22 DIAGNOSIS — J9611 Chronic respiratory failure with hypoxia: Secondary | ICD-10-CM | POA: Diagnosis not present

## 2019-03-22 DIAGNOSIS — G4733 Obstructive sleep apnea (adult) (pediatric): Secondary | ICD-10-CM | POA: Diagnosis not present

## 2019-03-22 DIAGNOSIS — N400 Enlarged prostate without lower urinary tract symptoms: Secondary | ICD-10-CM | POA: Diagnosis not present

## 2019-03-22 DIAGNOSIS — I10 Essential (primary) hypertension: Secondary | ICD-10-CM | POA: Diagnosis not present

## 2019-03-22 DIAGNOSIS — R69 Illness, unspecified: Secondary | ICD-10-CM | POA: Diagnosis not present

## 2019-03-22 DIAGNOSIS — J45909 Unspecified asthma, uncomplicated: Secondary | ICD-10-CM | POA: Diagnosis not present

## 2019-03-22 DIAGNOSIS — J439 Emphysema, unspecified: Secondary | ICD-10-CM | POA: Diagnosis not present

## 2019-03-24 ENCOUNTER — Other Ambulatory Visit: Payer: Self-pay | Admitting: Pulmonary Disease

## 2019-03-24 DIAGNOSIS — R0600 Dyspnea, unspecified: Secondary | ICD-10-CM

## 2019-03-24 DIAGNOSIS — J441 Chronic obstructive pulmonary disease with (acute) exacerbation: Secondary | ICD-10-CM

## 2019-03-26 ENCOUNTER — Other Ambulatory Visit: Payer: Self-pay | Admitting: *Deleted

## 2019-03-26 NOTE — Patient Outreach (Signed)
[facility]) Care Management  03/26/2019  Cory Kemp [birth year] [id number]   Call placed to member's daughter Cory Kemp to follow up on progress of management of member's care.  She report medication adherence has improved.  Has been active with [facility] pharmacist as well as pharmacist at pulmonology clinic.  She expresses gratitude regarding the amount of support she has received and improvement in member's medication management.  He still does have some shortness of breath at times, but is much better than prior to hospitalization.  Has follow up with pulmonology office next week.  Denies any urgent concerns, will follow up within the next month.  [facility] CM Care Plan Problem One     Most Recent Value  Care Plan Problem One  Risk for readmission related to COPD management as evidenced by recent hospitalization  Role Documenting the Problem One  Care Management [place] for Problem One  Not Active  [facility] Long Term Goal   Member will not be readmitted to hospital within the next 31 days  [facility] Long Term Goal Start Date  02/19/19  [facility] Long Term Goal Met Date  03/26/19  [facility] CM Short Term Goal #1   Member/daughter will report plan for increased support and plan to clean/fix home within the next 3 weeks  [facility] CM Short Term Goal #1 Start Date  02/19/19  [facility] CM Short Term Goal #1 Met Date  03/26/19  [facility] CM Short Term Goal #2   Member will report taking medications as instructed over the next 4 weeks  [facility] CM Short Term Goal #2 Start Date  02/19/19  [facility] CM Short Term Goal #2 Met Date  03/26/19     [name], RN, MSN [name] [phone]

## 2019-03-28 DIAGNOSIS — I10 Essential (primary) hypertension: Secondary | ICD-10-CM | POA: Diagnosis not present

## 2019-03-28 DIAGNOSIS — E538 Deficiency of other specified B group vitamins: Secondary | ICD-10-CM | POA: Diagnosis not present

## 2019-03-28 DIAGNOSIS — R69 Illness, unspecified: Secondary | ICD-10-CM | POA: Diagnosis not present

## 2019-03-28 DIAGNOSIS — J439 Emphysema, unspecified: Secondary | ICD-10-CM | POA: Diagnosis not present

## 2019-03-28 DIAGNOSIS — I2510 Atherosclerotic heart disease of native coronary artery without angina pectoris: Secondary | ICD-10-CM | POA: Diagnosis not present

## 2019-03-28 DIAGNOSIS — N400 Enlarged prostate without lower urinary tract symptoms: Secondary | ICD-10-CM | POA: Diagnosis not present

## 2019-03-28 DIAGNOSIS — J9611 Chronic respiratory failure with hypoxia: Secondary | ICD-10-CM | POA: Diagnosis not present

## 2019-03-28 DIAGNOSIS — G4733 Obstructive sleep apnea (adult) (pediatric): Secondary | ICD-10-CM | POA: Diagnosis not present

## 2019-03-28 DIAGNOSIS — J45909 Unspecified asthma, uncomplicated: Secondary | ICD-10-CM | POA: Diagnosis not present

## 2019-03-28 DIAGNOSIS — E119 Type 2 diabetes mellitus without complications: Secondary | ICD-10-CM | POA: Diagnosis not present

## 2019-03-29 DIAGNOSIS — N400 Enlarged prostate without lower urinary tract symptoms: Secondary | ICD-10-CM | POA: Diagnosis not present

## 2019-03-29 DIAGNOSIS — I2510 Atherosclerotic heart disease of native coronary artery without angina pectoris: Secondary | ICD-10-CM | POA: Diagnosis not present

## 2019-03-29 DIAGNOSIS — E119 Type 2 diabetes mellitus without complications: Secondary | ICD-10-CM | POA: Diagnosis not present

## 2019-03-29 DIAGNOSIS — E538 Deficiency of other specified B group vitamins: Secondary | ICD-10-CM | POA: Diagnosis not present

## 2019-03-29 DIAGNOSIS — J45909 Unspecified asthma, uncomplicated: Secondary | ICD-10-CM | POA: Diagnosis not present

## 2019-03-29 DIAGNOSIS — R69 Illness, unspecified: Secondary | ICD-10-CM | POA: Diagnosis not present

## 2019-03-29 DIAGNOSIS — I10 Essential (primary) hypertension: Secondary | ICD-10-CM | POA: Diagnosis not present

## 2019-03-29 DIAGNOSIS — J9611 Chronic respiratory failure with hypoxia: Secondary | ICD-10-CM | POA: Diagnosis not present

## 2019-03-29 DIAGNOSIS — J439 Emphysema, unspecified: Secondary | ICD-10-CM | POA: Diagnosis not present

## 2019-03-29 DIAGNOSIS — G4733 Obstructive sleep apnea (adult) (pediatric): Secondary | ICD-10-CM | POA: Diagnosis not present

## 2019-04-01 ENCOUNTER — Ambulatory Visit: Payer: Self-pay | Admitting: Pharmacist

## 2019-04-01 ENCOUNTER — Ambulatory Visit: Payer: Medicare HMO | Admitting: Pulmonary Disease

## 2019-04-01 ENCOUNTER — Encounter: Payer: Self-pay | Admitting: Pulmonary Disease

## 2019-04-01 ENCOUNTER — Other Ambulatory Visit: Payer: Self-pay

## 2019-04-01 ENCOUNTER — Other Ambulatory Visit: Payer: Self-pay | Admitting: Pharmacist

## 2019-04-01 VITALS — BP 132/78 | HR 90 | Temp 97.1°F | Ht 73.0 in | Wt 202.0 lb

## 2019-04-01 DIAGNOSIS — R69 Illness, unspecified: Secondary | ICD-10-CM | POA: Diagnosis not present

## 2019-04-01 DIAGNOSIS — R7303 Prediabetes: Secondary | ICD-10-CM | POA: Diagnosis not present

## 2019-04-01 DIAGNOSIS — E7800 Pure hypercholesterolemia, unspecified: Secondary | ICD-10-CM | POA: Diagnosis not present

## 2019-04-01 DIAGNOSIS — J441 Chronic obstructive pulmonary disease with (acute) exacerbation: Secondary | ICD-10-CM

## 2019-04-01 DIAGNOSIS — D649 Anemia, unspecified: Secondary | ICD-10-CM | POA: Diagnosis not present

## 2019-04-01 DIAGNOSIS — I10 Essential (primary) hypertension: Secondary | ICD-10-CM | POA: Diagnosis not present

## 2019-04-01 NOTE — Patient Outreach (Signed)
[facility]) Care Management  [place] 04/01/2019  Cory Kemp October 22, 1940 [id number]  Reason for call: f/u on medication adherence  Outreach:  Unsuccessful telephone call outreach to daughter.  Left HIPAA compliant voicemail.   Successful call with patient.  He reports he has not had any problems with medications in the last 2 weeks.  He reports a little difficulty with his physical therapy but states he's taking all his medications as prescribed.  He states he is very appreciative of how much his daughter is helping him.  He denies having any issues or concerns.  He states he has a f/u with his PCP today and will review medications at appt with him and daughter will accompany him to this visit.  Patient confirms he has my phone number if he needs to reach out to me for any reason.   Plan: Will close [facility] pharmacy case as no further medication issues at this time.  I am happy to assist in the future as needed. Thank you for allowing [facility] pharmacy to be involved in this patient's care.    Ralene Bathe, PharmD, [place] [phone]

## 2019-04-01 NOTE — Patient Instructions (Signed)
Glad you are doing well with regard to your breathing Continue inhalers Keep Daliresp at 250 mcg for now as you have symptoms of diarrhea We will reassess in 1 to 2 months with a tele-visit and decide if we want to uptitrate

## 2019-04-01 NOTE — Progress Notes (Signed)
Cory Kemp    [id number]    [birth year]  Primary Care Physician:Kim, Jeneen Rinks, MD  Referring Physician: Jani Gravel, MD [address] [place],  [place] [address]  Chief complaint:   Follow up for COPD GOLD D Asthmatic bronchitis Active smoker  HPI: Cory Kemp is a 78 year old chief complaint of dyspnea on exertion for the past 2 years. This is associated with cough, white mucus production, chest congestion, wheezing. He is a current 1 pack per day smoker. He is on albuterol which helps with her symptoms. He had been tried on various inhalers including Advair, Breo and Spiriva which did not help.  Interim history: Had several follows up with Wyn Quaker, NP for COPD management He has had several admissions for COPD exacerbation Currently maintained on Trelegy, supplemental oxygen and Daliresp. States that breathing is doing well with stable chronic dyspnea on exertion.  Outpatient Encounter Medications as of 04/01/2019  Medication Sig  . albuterol (PROVENTIL) (2.5 MG/3ML) 0.083% nebulizer solution Take 3 mLs (2.5 mg total) by nebulization every 6 (six) hours as needed for wheezing or shortness of breath.  . Cholecalciferol (VITAMIN D-3) 25 MCG (1000 UT) CAPS Take 1,000 Units by mouth at bedtime.  . Cyanocobalamin (VITAMIN B-12) 5000 MCG TBDP Take 5,000 mcg by mouth daily.   [name] Kitchen escitalopram (LEXAPRO) 5 MG tablet Take 5 mg by mouth daily.  . fluticasone (FLONASE) 50 MCG/ACT nasal spray Place 2 sprays into both nostrils daily. Reduce to 1 spray each nostril daily after stopping Afrin  . ibuprofen (ADVIL) 200 MG tablet Take 400 mg by mouth at bedtime.  [name] Kitchen losartan (COZAAR) 50 MG tablet Take 50 mg by mouth at bedtime.   . OXYGEN Inhale 4 L into the lungs continuous.  . predniSONE (DELTASONE) 10 MG tablet Take 30mg  (three tabs) on day one , then 20mg  ( two tabs) on day two, then 10mg  (one tab) on day three, then stop. (Patient taking differently: Take 10 mg by mouth  daily with breakfast. Take 30mg  (three tabs) on day one , then 20mg  ( two tabs) on day two, then 10mg  (one tab) on day three, then stop.)  . Roflumilast (DALIRESP) 250 MCG TABS Take 1 tablet by mouth every morning.  . simvastatin (ZOCOR) 40 MG tablet Take 40 mg by mouth at bedtime.   . TRELEGY ELLIPTA 100-62.5-25 MCG/INH AEPB INHALE 1 PUFF BY MOUTH EVERY DAY.  . VENTOLIN HFA 108 (90 Base) MCG/ACT inhaler INHALE 2 PUFFS INTO THE LUNG EVERY 4 HOURS AS NEEDED FOR WHEEZING OR SHORTNESS OF BREATH.   No facility-administered encounter medications on file as of 04/01/2019.     Allergies as of 04/01/2019 - Review Complete 04/01/2019  Allergen Reaction Noted  . Adhesive [tape] Rash 02/26/2012    Past Medical History:  Diagnosis Date  . Alcohol abuse   . Anemia   . Arthritis   . Asthma   . B12 deficiency 11/03/2013  . BPH (benign prostatic hypertrophy)   . COPD (chronic obstructive pulmonary disease) ([place])   . Dementia ([place])    ?  [name] Kitchen Diabetes mellitus ([place]) 01/10/2012  . Femoral hernia, right 03/27/2012  . Glucose intolerance (impaired glucose tolerance)   . Hyperlipidemia   . Hypertension   . Incisional hernias - swiss-cheese-type periumbilical [id number]  . Inguinal hernia - right 01/10/2012  . Leukopenia 01/29/2013  . Monocytosis 01/29/2013  . Normochromic anemia 01/29/2013  . Osteopenia     Past Surgical History:  Procedure Laterality Date  . HEMORRHOID SURGERY    . HERNIA REPAIR    . INGUINAL HERNIA REPAIR  02/24/2012   Procedure: LAPAROSCOPIC INGUINAL HERNIA;  Surgeon: Adin Hector, MD;  Location: [facility] ORS;  Service: General;  Laterality: N/A;  . TUMOR EXCISION     intestinal after SBO ( Benign), appendectomy  . VENTRAL HERNIA REPAIR  02/24/2012   Procedure: LAPAROSCOPIC VENTRAL HERNIA;  Surgeon: Adin Hector, MD;  Location: [facility] ORS;  Service: General;  Laterality: N/A;  lysis of adhesions, excision of abdominal wall lipomae    Family History  Problem Relation Age of Onset  . Lung  cancer Father   . Lung cancer Mother   . Diabetes Brother     Social History   Socioeconomic History  . Marital status: Widowed    Spouse name: Not on file  . Number of children: 2  . Years of education: Not on file  . Highest education level: Not on file  Occupational History  . Occupation: Retired    Comment: International aid/development worker  . Financial resource strain: Somewhat hard  . Food insecurity    Worry: Sometimes true    Inability: Sometimes true  . Transportation needs    Medical: No    Non-medical: No  Tobacco Use  . Smoking status: Former Smoker    Packs/day: 1.00    Years: 63.00    Pack years: 63.00    Types: Cigarettes    Start date: 08/09/1955    Quit date: 08/08/2018    Years since quitting: 0.6  . Smokeless tobacco: Never Used  . Tobacco comment: reports he stopped at the beginning of this year  Substance and Sexual Activity  . Alcohol use: No    Alcohol/week: 0.0 standard drinks  . Drug use: No  . Sexual activity: Not on file  Lifestyle  . Physical activity    Days per week: 0 days    Minutes per session: 0 min  . Stress: Only a little  Relationships  . Social Herbalist on phone: Not on file    Gets together: Not on file    Attends religious service: Not on file    Active member of club or organization: Not on file    Attends meetings of clubs or organizations: Not on file    Relationship status: Not on file  . Intimate partner violence    Fear of current or ex partner: Not on file    Emotionally abused: Not on file    Physically abused: Not on file    Forced sexual activity: Not on file  Other Topics Concern  . Not on file  Social History Narrative  . Not on file   Review of systems: Review of Systems  Constitutional: Negative for fever and chills.  HENT: Negative.   Eyes: Negative for blurred vision.  Respiratory: as per HPI  Cardiovascular: Negative for chest pain and palpitations.  Gastrointestinal: Negative for vomiting,  diarrhea, blood per rectum. Genitourinary: Negative for dysuria, urgency, frequency and hematuria.  Musculoskeletal: Negative for myalgias, back pain and joint pain.  Skin: Negative for itching and rash.  Neurological: Negative for dizziness, tremors, focal weakness, seizures and loss of consciousness.  Endo/Heme/Allergies: Negative for environmental allergies.  Psychiatric/Behavioral: Negative for depression, suicidal ideas and hallucinations.  All other systems reviewed and are negative.  Physical Exam: Blood pressure 132/78, pulse 89, height 6\' 1"  (1.854 m), weight 188 lb (85.3 kg), SpO2 90 %.  Gen:      No acute distress HEENT:  EOMI, sclera anicteric Neck:     No masses; no thyromegaly Lungs:    Scattered bilateral crackles, no wheeze. CV:         Regular rate and rhythm; no murmurs Abd:      + bowel sounds; soft, non-tender; no palpable masses, no distension Ext:    No edema; adequate peripheral perfusion Skin:      Warm and dry; no rash Neuro: alert and oriented x 3 Psych: normal mood and affect  Data Reviewed: Imaging: CT chest 09/12/14- Emphysematous changes noted bilaterally. Minimal posterior basilar scarring or subsegmental atelectasis is noted. No pulmonary mass or nodule is noted CT angiogram 04/10/16- emphysema, chronic bronchitis.  Subsegmental atelectasis at the bases mildly increased compared to previous CTA chest 12/25/2018- no evidence of acute PE, emphysema with left greater than right lower lobe pneumonia, small layering left pleural effusion, mild reactive hilar lymph nodes CTA chest 02/11/2019- no definite large central PE, moderate to severe emphysema with bullous changes of the right lung base, 9 mm focal nodularity right middle lobe appear similar to prior CT and may represent scarring I have reviewed the images personally.  PFTs: 01/21/16 FVC 2.44 (71%), FEV1 1.47 [41%), F/F 43, TLC 122%, RV/TLC 166%, DLCO 48% Severe obstruction with reduction in diffusion  capacity. Hyperinflation with air trapping.  Sleep: Home sleep test 11/14/16 Moderate OSA with desats.  In lab sleep study 12/16/2017 AHI 0.8, nocturnal desaturations.  Assessment:  COPD  Continue Trelegy inhaler On chronic low-dose prednisone at 10 mg Started on Daliresp.  Has symptoms of diarrhea with this We will maintain daliresp dose at 250 a day and hold off on up titration to 500 Continue monitoring symptoms. Continue supplemental oxygen  Nocturnal hypoxia Sleep study reviewed which did not show any sleep apnea.  He does have nocturnal desats and is started on supplemental oxygen  Health maintenance Give Prevnar vaccine today.  Pneumovax next year  Plan/Recommendations: - Continue Trelegy, albuterol as needed - Daliresp - Supplemental oxygen  [name] MD [facility] 04/01/2019, 3:46 PM  CC: Jani Gravel, MD

## 2019-04-08 DIAGNOSIS — J449 Chronic obstructive pulmonary disease, unspecified: Secondary | ICD-10-CM | POA: Diagnosis not present

## 2019-04-08 DIAGNOSIS — J441 Chronic obstructive pulmonary disease with (acute) exacerbation: Secondary | ICD-10-CM | POA: Diagnosis not present

## 2019-04-10 ENCOUNTER — Other Ambulatory Visit: Payer: Self-pay

## 2019-04-10 ENCOUNTER — Emergency Department (HOSPITAL_COMMUNITY)
Admission: EM | Admit: 2019-04-10 | Discharge: 2019-04-10 | Disposition: A | Discharge status: Home / Self Care | Payer: Medicare HMO | Attending: Emergency Medicine | Admitting: Emergency Medicine

## 2019-04-10 ENCOUNTER — Emergency Department (HOSPITAL_COMMUNITY): Payer: Medicare HMO

## 2019-04-10 ENCOUNTER — Encounter (HOSPITAL_COMMUNITY): Payer: Self-pay

## 2019-04-10 DIAGNOSIS — J45909 Unspecified asthma, uncomplicated: Secondary | ICD-10-CM | POA: Diagnosis not present

## 2019-04-10 DIAGNOSIS — R0689 Other abnormalities of breathing: Secondary | ICD-10-CM | POA: Diagnosis not present

## 2019-04-10 DIAGNOSIS — E119 Type 2 diabetes mellitus without complications: Secondary | ICD-10-CM | POA: Insufficient documentation

## 2019-04-10 DIAGNOSIS — J449 Chronic obstructive pulmonary disease, unspecified: Secondary | ICD-10-CM | POA: Diagnosis not present

## 2019-04-10 DIAGNOSIS — R069 Unspecified abnormalities of breathing: Secondary | ICD-10-CM | POA: Diagnosis not present

## 2019-04-10 DIAGNOSIS — R0602 Shortness of breath: Secondary | ICD-10-CM | POA: Diagnosis not present

## 2019-04-10 DIAGNOSIS — I10 Essential (primary) hypertension: Secondary | ICD-10-CM | POA: Insufficient documentation

## 2019-04-10 DIAGNOSIS — Z79899 Other long term (current) drug therapy: Secondary | ICD-10-CM | POA: Insufficient documentation

## 2019-04-10 DIAGNOSIS — R0902 Hypoxemia: Secondary | ICD-10-CM | POA: Diagnosis not present

## 2019-04-10 DIAGNOSIS — R000 Tachycardia, unspecified: Secondary | ICD-10-CM | POA: Diagnosis not present

## 2019-04-10 LAB — BASIC METABOLIC PANEL
Anion gap: 12 (ref 5–15)
BUN: 18 mg/dL (ref 8–23)
CO2: 29 mmol/L (ref 22–32)
Calcium: 8.9 mg/dL (ref 8.9–10.3)
Chloride: 100 mmol/L (ref 98–111)
Creatinine, Ser: 0.74 mg/dL (ref 0.61–1.24)
GFR calc Af Amer: >60 mL/min (ref >60)
GFR calc non Af Amer: >60 mL/min (ref >60)
Glucose, Bld: 131 mg/dL — ABNORMAL HIGH (ref 70–99)
Potassium: 3.6 mmol/L (ref 3.5–5.1)
Sodium: 141 mmol/L (ref 135–145)

## 2019-04-10 LAB — CBC WITH DIFFERENTIAL/PLATELET
Abs Immature Granulocytes: 0.04 10*3/uL (ref 0.00–0.07)
Basophils Absolute: 0 10*3/uL (ref 0.0–0.1)
Basophils Relative: 0 %
Eosinophils Absolute: 0 10*3/uL (ref 0.0–0.5)
Eosinophils Relative: 0 %
HCT: 37.7 % — ABNORMAL LOW (ref 39.0–52.0)
Hemoglobin: 11.9 g/dL — ABNORMAL LOW (ref 13.0–17.0)
Immature Granulocytes: 2 %
Lymphocytes Relative: 15 %
Lymphs Abs: 0.4 10*3/uL — ABNORMAL LOW (ref 0.7–4.0)
MCH: 29.7 pg (ref 26.0–34.0)
MCHC: 31.6 g/dL (ref 30.0–36.0)
MCV: 94 fL (ref 80.0–100.0)
Monocytes Absolute: 0.3 10*3/uL (ref 0.1–1.0)
Monocytes Relative: 11 %
Neutro Abs: 1.7 10*3/uL (ref 1.7–7.7)
Neutrophils Relative %: 72 %
Platelets: 137 10*3/uL — ABNORMAL LOW (ref 150–400)
RBC: 4.01 MIL/uL — ABNORMAL LOW (ref 4.22–5.81)
RDW: 14.8 % (ref 11.5–15.5)
WBC: 2.4 10*3/uL — ABNORMAL LOW (ref 4.0–10.5)
nRBC: 0 % (ref 0.0–0.2)

## 2019-04-10 LAB — BRAIN NATRIURETIC PEPTIDE: B Natriuretic Peptide: 81.2 pg/mL (ref 0.0–100.0)

## 2019-04-10 LAB — TROPONIN I (HIGH SENSITIVITY)
Troponin I (High Sensitivity): 25 ng/L — ABNORMAL HIGH (ref <18)
Troponin I (High Sensitivity): 24 ng/L — ABNORMAL HIGH (ref <18)

## 2019-04-10 MED ORDER — ALBUTEROL SULFATE HFA 108 (90 BASE) MCG/ACT IN AERS
2.0000 | INHALATION_SPRAY | Freq: Once | RESPIRATORY_TRACT | Status: AC
  Administered 2019-04-10: 2 via RESPIRATORY_TRACT
  Filled 2019-04-10: qty 6.7

## 2019-04-10 NOTE — Discharge Instructions (Addendum)
Please call your primary doctor for close recheck ideally to be seen within 24 to 48 hours to discuss the episode you had today.  If at any point you have return of shortness of breath, you develop chest pain, lightheadedness, fever or other new symptom, please return to the ER for reassessment.

## 2019-04-10 NOTE — ED Triage Notes (Signed)
Pt arrived from home with a COPD exacerbation, per EMS upon arrival patient was on 5L and oxygen saturation at 80%, then placed on a nonrebreather. Pt given 125 of solumedrol. Upon arrival to hospital patients saturations at 96% on 4L O2. Pt states he normally wears 2-4L of oxygen at home.

## 2019-04-10 NOTE — ED Provider Notes (Signed)
[facility] DEPT Provider Note   CSN: [id number] Arrival date & time: 04/10/19  [id number]     History   Chief Complaint Chief Complaint  Patient presents with  . Shortness of Breath    HPI Cory Kemp is a 78 y.o. male.  Presents emerged department after episode of feeling short of breath.  Patient reports history related to bed he was not having any symptoms, but this morning when he woke up he felt very short of breath and very anxious.  Friend called EMS, they noted patient to be hypoxic to the 80s, placed on nonrebreather, given 125 mg Solu-Medrol.  Patient states that soon after getting to the hospital, his symptoms completely resolved and he feels that he is at his baseline.  States he normally wears 2 L nasal cannula.  No new cough, no fever, no sick contacts, no associated chest pain.  No leg pain, no periods of immobility, recent surgery.  No prior history of DVT, PE, not on anticoagulation.  Does have history of COPD.  Has nebulizer at home.  Followed by [facility] pulmonology.     HPI  Past Medical History:  Diagnosis Date  . Alcohol abuse   . Anemia   . Arthritis   . Asthma   . B12 deficiency 11/03/2013  . BPH (benign prostatic hypertrophy)   . COPD (chronic obstructive pulmonary disease) ([place])   . Dementia ([place])    ?  [name] Kitchen Diabetes mellitus ([place]) 01/10/2012  . Femoral hernia, right 03/27/2012  . Glucose intolerance (impaired glucose tolerance)   . Hyperlipidemia   . Hypertension   . Incisional hernias - swiss-cheese-type periumbilical [id number]  . Inguinal hernia - right 01/10/2012  . Leukopenia 01/29/2013  . Monocytosis 01/29/2013  . Normochromic anemia 01/29/2013  . Osteopenia     Patient Active Problem List   Diagnosis Date Noted  . Medication management 03/05/2019  . Acute exacerbation of chronic obstructive pulmonary disease (COPD) ([place]) 02/11/2019  . COPD with acute exacerbation ([place]) 02/08/2019  . Hypertensive urgency 02/08/2019  . CAP  (community acquired pneumonia) 12/25/2018  . Acute on chronic respiratory failure with hypoxia ([place]) 12/25/2018  . Elevated d-dimer 12/25/2018  . Chronic respiratory failure with hypoxia ([place]) 12/13/2018  . Abnormal finding on EKG 01/03/2018  . OSA (obstructive sleep apnea) 06/14/2017  . Sinus tachycardia 04/11/2016  . Essential hypertension 04/11/2016  . COPD (chronic obstructive pulmonary disease) ([place]) 01/11/2016  . B12 deficiency 11/03/2013  . Leukopenia 01/29/2013  . Monocytosis 01/29/2013  . Normochromic anemia 01/29/2013  . Former smoker 03/27/2012  . Diabetes mellitus ([place]) 01/10/2012    Past Surgical History:  Procedure Laterality Date  . HEMORRHOID SURGERY    . HERNIA REPAIR    . INGUINAL HERNIA REPAIR  02/24/2012   Procedure: LAPAROSCOPIC INGUINAL HERNIA;  Surgeon: Adin Hector, MD;  Location: [facility] ORS;  Service: General;  Laterality: N/A;  . TUMOR EXCISION     intestinal after SBO ( Benign), appendectomy  . VENTRAL HERNIA REPAIR  02/24/2012   Procedure: LAPAROSCOPIC VENTRAL HERNIA;  Surgeon: Adin Hector, MD;  Location: [facility] ORS;  Service: General;  Laterality: N/A;  lysis of adhesions, excision of abdominal wall lipomae        Home Medications    Prior to Admission medications   Medication Sig Start Date End Date Taking? Authorizing Provider  albuterol (PROVENTIL) (2.5 MG/3ML) 0.083% nebulizer solution Take 3 mLs (2.5 mg total) by nebulization every 6 (six) hours as needed for wheezing  or shortness of breath. 01/03/18   Lauraine Rinne, NP  Cholecalciferol (VITAMIN D-3) 25 MCG (1000 UT) CAPS Take 1,000 Units by mouth at bedtime.    [provider]  Cyanocobalamin (VITAMIN B-12) 5000 MCG TBDP Take 5,000 mcg by mouth daily.     [provider]  escitalopram (LEXAPRO) 5 MG tablet Take 5 mg by mouth daily. 12/11/18   [provider]  fluticasone (FLONASE) 50 MCG/ACT nasal spray Place 2 sprays into both nostrils daily. Reduce to 1 spray each  nostril daily after stopping Afrin 03/20/19   Mannam, Praveen, MD  ibuprofen (ADVIL) 200 MG tablet Take 400 mg by mouth at bedtime.    [provider]  losartan (COZAAR) 50 MG tablet Take 50 mg by mouth at bedtime.  12/26/11   [provider]  OXYGEN Inhale 4 L into the lungs continuous.    [provider]  predniSONE (DELTASONE) 10 MG tablet Take 30mg  (three tabs) on day one , then 20mg  ( two tabs) on day two, then 10mg  (one tab) on day three, then stop. Patient taking differently: Take 10 mg by mouth daily with breakfast. Take 30mg  (three tabs) on day one , then 20mg  ( two tabs) on day two, then 10mg  (one tab) on day three, then stop. 02/13/19   Florencia Reasons, MD  Roflumilast (DALIRESP) 250 MCG TABS Take 1 tablet by mouth every morning. 03/20/19   Lauraine Rinne, NP  simvastatin (ZOCOR) 40 MG tablet Take 40 mg by mouth at bedtime.  12/27/11   [provider]  TRELEGY ELLIPTA 100-62.5-25 MCG/INH AEPB INHALE 1 PUFF BY MOUTH EVERY DAY. 02/20/19   Mannam, Praveen, MD  VENTOLIN HFA 108 (90 Base) MCG/ACT inhaler INHALE 2 PUFFS INTO THE LUNG EVERY 4 HOURS AS NEEDED FOR WHEEZING OR SHORTNESS OF BREATH. 03/25/19   [name], MD    Family History Family History  Problem Relation Age of Onset  . Lung cancer Father   . Lung cancer Mother   . Diabetes Brother     Social History Social History   Tobacco Use  . Smoking status: Former Smoker    Packs/day: 1.00    Years: 63.00    Pack years: 63.00    Types: Cigarettes    Start date: 08/09/1955    Quit date: 08/08/2018    Years since quitting: 0.6  . Smokeless tobacco: Never Used  . Tobacco comment: reports he stopped at the beginning of this year  Substance Use Topics  . Alcohol use: No    Alcohol/week: 0.0 standard drinks  . Drug use: No     Allergies   Adhesive [tape]   Review of Systems Review of Systems  Constitutional: Negative for chills and fever.  HENT: Negative for ear pain and sore throat.   Eyes:  Negative for pain and visual disturbance.  Respiratory: Positive for shortness of breath. Negative for cough.   Cardiovascular: Negative for chest pain and palpitations.  Gastrointestinal: Negative for abdominal pain and vomiting.  Genitourinary: Negative for dysuria and hematuria.  Musculoskeletal: Negative for arthralgias and back pain.  Skin: Negative for color change and rash.  Neurological: Negative for seizures and syncope.  All other systems reviewed and are negative.    Physical Exam Updated Vital Signs BP (!) 150/73   Pulse (!) 101   Temp 98 F (36.7 C) (Oral)   Resp 19   SpO2 94%   Physical Exam Vitals signs and nursing note reviewed.  Constitutional:  Appearance: He is well-developed.  HENT:     Head: Normocephalic and atraumatic.  Eyes:     Conjunctiva/sclera: Conjunctivae normal.  Neck:     Musculoskeletal: Neck supple.  Cardiovascular:     Rate and Rhythm: Regular rhythm. Tachycardia present.     Heart sounds: No murmur.  Pulmonary:     Effort: Pulmonary effort is normal. No respiratory distress.     Breath sounds: Normal breath sounds.  Abdominal:     Palpations: Abdomen is soft.     Tenderness: There is no abdominal tenderness.  Skin:    General: Skin is warm and dry.  Neurological:     Mental Status: He is alert.      ED Treatments / Results  Labs (all labs ordered are listed, but only abnormal results are displayed) Labs Reviewed  CBC WITH DIFFERENTIAL/PLATELET - Abnormal; Notable for the following components:      Result Value   WBC 2.4 (*)    RBC 4.01 (*)    Hemoglobin 11.9 (*)    HCT 37.7 (*)    Platelets 137 (*)    Lymphs Abs 0.4 (*)    All other components within normal limits  BASIC METABOLIC PANEL - Abnormal; Notable for the following components:   Glucose, Bld 131 (*)    All other components within normal limits  TROPONIN I (HIGH SENSITIVITY) - Abnormal; Notable for the following components:   Troponin I (High Sensitivity)  25 (*)    All other components within normal limits  TROPONIN I (HIGH SENSITIVITY) - Abnormal; Notable for the following components:   Troponin I (High Sensitivity) 24 (*)    All other components within normal limits  BRAIN NATRIURETIC PEPTIDE    EKG EKG Interpretation  Date/Time:  Wednesday April 10 2019 08:04:47 EDT Ventricular Rate:  103 PR Interval:    QRS Duration: 95 QT Interval:  373 QTC Calculation: 474 R Axis:   79 Text Interpretation:  Sinus tachycardia Multiform ventricular premature complexes Prolonged PR interval RAE, consider biatrial enlargement no significant change when compared to prior ECG on 02/11/2019 Confirmed by Madalyn Rob [phone]) on 04/10/2019 10:15:03 AM   Radiology Dg Chest Portable 1 View  Result Date: 04/10/2019 CLINICAL DATA:  Shortness of breath and COPD exacerbation EXAM: PORTABLE CHEST 1 VIEW COMPARISON:  02/13/2019 FINDINGS: Cardiac shadow is stable. The lungs are hyperinflated consistent with COPD. No focal infiltrate or sizable effusion is noted. No acute bony abnormality is seen. IMPRESSION: COPD without acute abnormality. Electronically Signed   By: Inez Catalina M.D.   On: 04/10/2019 08:06    Procedures Procedures (including critical care time)  Medications Ordered in ED Medications  albuterol (VENTOLIN HFA) 108 (90 Base) MCG/ACT inhaler 2 puff (2 puffs Inhalation Given 04/10/19 0753)     Initial Impression / Assessment and Plan / ED Course  I have reviewed the triage vital signs and the nursing notes.  Pertinent labs & imaging results that were available during my care of the patient were reviewed by me and considered in my medical decision making (see chart for details).  Clinical Course as of Apr 09 1520  Wed Apr 10, 2019  0722 Reviewed chart, will go assess patient   [RD]  0800 Initial assessment, patient states his symptoms have completely resolved, he is remarkably well-appearing compared to my initial assessment of the EMS  report   [RD]  1043 Recheck patient, no repeat troponin 24, no delta troponin, patient denying any ongoing shortness of breath, again  denies chest pain.  Recommended admission for further observation, cardiology consultation, however abdomen given the resolution of his symptoms he would like to pursue further work-up as outpatient with his primary doctor, will discharge home with very strict return cautions   [RD]    Clinical Course User Index [RD] Lucrezia Starch, MD       78 year old male history of COPD presents emergency department after having episode shortness of breath.  Per EMS report, patient was hypoxic, required nonrebreather.  However on arrival here patient symptoms have resolved and his oxygen requirement went down to his baseline O2 2 L nasal cannula.  At time of my assessment, patient denied any ongoing shortness of breath, had no tachypnea, lungs were clear.  Work-up negative for pneumonia, EKG without acute ischemic changes however high-sensitivity troponin was 25.  Patient reported having similar episodes previously, attributes to anxiety.  Patient was willing to stay for repeat troponin however after I recommended cardiology consultation and further observation, patient adamant about going home and pursuing outpatient work-up with his primary doctor.  Given he had complete resolution of symptoms, is on home oxygen, did not have associated chest pain, will honor this request and discharge patient home.  Patient was agreeable to my strict discharge instructions.  After the discussed management above, the patient was determined to be safe for discharge.  The patient was in agreement with this plan and all questions regarding their care were answered.  ED return precautions were discussed and the patient will return to the ED with any significant worsening of condition.    Final Clinical Impressions(s) / ED Diagnoses   Final diagnoses:  SOB (shortness of breath)    ED  Discharge Orders    None       Lucrezia Starch, MD 04/10/19 1521

## 2019-04-12 ENCOUNTER — Telehealth (HOSPITAL_COMMUNITY): Payer: Self-pay | Admitting: *Deleted

## 2019-04-12 NOTE — Telephone Encounter (Signed)
Called and spoke to pt regarding Pulmonary Rehab to clarify whether pt is still receiving home health PT.  Pt daughter is also on the phone.  Pt has completed his therapy and is no longer receiving these services. Pt given seated exercises that he can do.  Assessed pt ability to participate in Pulmonary Rehab.  Pt does not use any assistive device and can only walk the distance from his home to his car due to shortness of breath and overall fatigue.  Pt has a rolater at home but does not use it.  Asked if he were to use the rolater could he walk further.  Pt thought that he might be able to.  Ask Daughter to bring her father to the parking garage nearest [address] to see if and how far he can walk with the rolater and oxygen tank.  Daughter appreciated the gesture and will call to set up a time.  Contact information provided to the daughter. Cherre Huger, BSN Cardiac and Training and development officer

## 2019-04-20 DIAGNOSIS — J441 Chronic obstructive pulmonary disease with (acute) exacerbation: Secondary | ICD-10-CM | POA: Diagnosis not present

## 2019-04-23 ENCOUNTER — Other Ambulatory Visit: Payer: Self-pay | Admitting: *Deleted

## 2019-04-23 ENCOUNTER — Other Ambulatory Visit: Payer: Self-pay

## 2019-04-23 ENCOUNTER — Ambulatory Visit: Payer: Self-pay

## 2019-04-23 NOTE — Patient Outreach (Signed)
[facility]) Care Management  04/23/2019  Cory Kemp [birth year] [id number]   Unsuccessful follow up call to patient's daughter today.  Unable to leave message due to mailbox being full.  Will attempt to reach again within four business days.  Ronn Melena, BSW Social Worker [phone]

## 2019-04-23 NOTE — Patient Outreach (Signed)
[facility]) Care Management  04/23/2019  Cory Kemp April 17, 1941 [id number]   Call placed to member's daughter to follow up on management of his COPD, no answer.  Unable to leave message as mailbox is full.  Call then placed to member.  He report he is doing "alright."  State he has "learned to control my breathing."  He have a daily routine where he checks his oxygen levels and monitor breathing, using inhalers and nebulizers as indicated.  State he try to stay as active as he can but manages his activity as tolerated due to his breathing and SpO2 results, keeping it between 88-94%.  This care manager inquired about plans for pulmonary rehab as per chart it was recommended.  According to note, daughter was to take member and have him try to ambulate from parking garage, testing his distance and shortness of breath.  Unclear if this has been set up, member unaware but advised to discuss with daughter.    Denies any urgent concerns at this time, will follow up within the next month. Margie Billet CM Care Plan Problem One     Most Recent Value  Care Plan Problem One  Knowledge deficit regarding COPD management as evidenced by recurrent episodes of shortness of breath  Role Documenting the Problem One  Care Management [place] for Problem One  Active  [facility] Long Term Goal   Member will be able to verbalize yellow zone of COPD action plan within the next 31 days  [facility] Long Term Goal Start Date  04/23/19  Interventions for Problem One Long Term Goal  Reviewed COPD action plan and use of inhalers, nebulizers, and home O2.  [facility] CM Short Term Goal #1   Member/daughter will be able to verbalize plan for pulmonary rehab within the next 4 weeks  [facility] CM Short Term Goal #1 Start Date  04/23/19  Interventions for Short Term Goal #1  Attempted to contact daughter regarding rehab, advised member to discuss with daughter and follow up directly with rehab     [name], RN, MSN [place] Manager [phone]

## 2019-04-25 ENCOUNTER — Ambulatory Visit: Payer: Self-pay

## 2019-04-25 ENCOUNTER — Other Ambulatory Visit: Payer: Self-pay

## 2019-04-25 NOTE — Patient Outreach (Signed)
[facility]) Care Management  04/25/2019  Cory Kemp 12/24/1940 [id number]   Unsuccessful follow up call to daughter today; voicemail box full.  Closing social work case due to inability to maintain contact with daughter.  [facility] is unable to assist with home modifications that daughter is seeking.  Daughter previously provided with contact information for [facility] through the [facility] as well as [employer].  Ronn Melena, BSW Social Worker [phone]

## 2019-05-08 DIAGNOSIS — J441 Chronic obstructive pulmonary disease with (acute) exacerbation: Secondary | ICD-10-CM | POA: Diagnosis not present

## 2019-05-08 DIAGNOSIS — J449 Chronic obstructive pulmonary disease, unspecified: Secondary | ICD-10-CM | POA: Diagnosis not present

## 2019-05-13 ENCOUNTER — Other Ambulatory Visit: Payer: Self-pay | Admitting: Pulmonary Disease

## 2019-05-13 DIAGNOSIS — J441 Chronic obstructive pulmonary disease with (acute) exacerbation: Secondary | ICD-10-CM

## 2019-05-15 ENCOUNTER — Other Ambulatory Visit: Payer: Self-pay

## 2019-05-15 ENCOUNTER — Encounter: Payer: Self-pay | Admitting: Pulmonary Disease

## 2019-05-15 ENCOUNTER — Ambulatory Visit (INDEPENDENT_AMBULATORY_CARE_PROVIDER_SITE_OTHER): Payer: Medicare HMO | Admitting: Pulmonary Disease

## 2019-05-15 DIAGNOSIS — Z79899 Other long term (current) drug therapy: Secondary | ICD-10-CM

## 2019-05-15 DIAGNOSIS — J441 Chronic obstructive pulmonary disease with (acute) exacerbation: Secondary | ICD-10-CM | POA: Diagnosis not present

## 2019-05-15 MED ORDER — ROFLUMILAST 500 MCG PO TABS: 500.0000 ug | ORAL_TABLET | Freq: Every day | ORAL | 11 refills | Status: DC

## 2019-05-15 NOTE — Progress Notes (Signed)
Virtual Visit via Telephone Note  I connected with Cory Kemp on 05/15/19 at 10:00 AM EDT by telephone and verified that I am speaking with the correct person using two identifiers.  Location: Patient: Home Provider: [facility], [place], [place]   I discussed the limitations, risks, security and privacy concerns of performing an evaluation and management service by telephone and the availability of in person appointments. I also discussed with the patient that there may be a patient responsible charge related to this service. The patient expressed understanding and agreed to proceed.   History of Present Illness: Follow up for COPD GOLD D Asthmatic bronchitis Active smoker  HPI: [name] is a 78 year old chief complaint of dyspnea on exertion for the past 2 years. This is associated with cough, white mucus production, chest congestion, wheezing. He is a current 1 pack per day smoker. He is on albuterol which helps with her symptoms. He had been tried on various inhalers including Advair, Breo and Spiriva which did not help. Currently maintained on Trelegy and Daliresp. At last visit we had maintained Daliresp at 250 mcg/day for symptoms of diarrhea   Observations/Objective: States that diarrhea is much better now.  No ongoing symptoms Has chronic dyspnea on exertion.  Continuing Trelegy and supplemental oxygen.  Assessment and Plan: COPD Gold D Stable symptoms.  No exacerbations since last visit.  Follow Up Instructions: Continue the Trelegy and supplemental oxygen Increase daliresp to 548mcg/day   I discussed the assessment and treatment plan with the patient. The patient was provided an opportunity to ask questions and all were answered. The patient agreed with the plan and demonstrated an understanding of the instructions.   The patient was advised to call back or seek an in-person evaluation if the symptoms worsen or if the condition fails to  improve as anticipated.  I provided 25 minutes of non-face-to-face time during this encounter.   [name] MD [facility] 05/15/2019, 9:55 AM

## 2019-05-15 NOTE — Addendum Note (Signed)
Addended by: Hildred Alamin I on: 05/15/2019 10:14 AM   Modules accepted: Orders

## 2019-05-15 NOTE — Patient Instructions (Signed)
I am glad you are doing well with regard to your breathing We will increase the daliresp to 500 mcg a day  Follow-up in 3 months.

## 2019-05-20 DIAGNOSIS — J441 Chronic obstructive pulmonary disease with (acute) exacerbation: Secondary | ICD-10-CM | POA: Diagnosis not present

## 2019-05-21 ENCOUNTER — Other Ambulatory Visit: Payer: Self-pay | Admitting: *Deleted

## 2019-05-21 NOTE — Patient Outreach (Signed)
[name]  05/21/2019  Cory Kemp Jul 09, 1941 [id number]   Call placed to member to follow up on management of COPD.  He report he is much better, learning more how to "manage" his breathing.  State he has developed a daily routine to decrease episodes of shortness of breath.  Had follow up with pulmonologist on 10/7, next appointment in 3 months.  Denies any urgent needs, agree to ongoing calls regarding COPD management.    Will place referral to health coach, will notify primary MD of transition.  [facility] CM Care Plan Problem One     Most Recent Value  Care Plan Problem One  Knowledge deficit regarding COPD management as evidenced by recurrent episodes of shortness of breath  Role Documenting the Problem One  Care Management [place] for Problem One  Not Active  [facility] Long Term Goal   Member will be able to verbalize yellow zone of COPD action plan within the next 31 days  [facility] Long Term Goal Start Date  04/23/19  [facility] Long Term Goal Met Date  05/21/19  [facility] CM Short Term Goal #1   Member/daughter will be able to verbalize plan for pulmonary rehab within the next 4 weeks  [facility] CM Short Term Goal #1 Start Date  04/23/19  [facility] CM Short Term Goal #1 Met Date  -- [not met]     [name], RN, MSN [place] Manager [phone]

## 2019-05-23 ENCOUNTER — Other Ambulatory Visit: Payer: Self-pay | Admitting: *Deleted

## 2019-06-08 DIAGNOSIS — J441 Chronic obstructive pulmonary disease with (acute) exacerbation: Secondary | ICD-10-CM | POA: Diagnosis not present

## 2019-06-08 DIAGNOSIS — J449 Chronic obstructive pulmonary disease, unspecified: Secondary | ICD-10-CM | POA: Diagnosis not present

## 2019-06-10 DIAGNOSIS — I10 Essential (primary) hypertension: Secondary | ICD-10-CM | POA: Diagnosis not present

## 2019-06-10 DIAGNOSIS — J449 Chronic obstructive pulmonary disease, unspecified: Secondary | ICD-10-CM | POA: Diagnosis not present

## 2019-06-10 DIAGNOSIS — M542 Cervicalgia: Secondary | ICD-10-CM | POA: Diagnosis not present

## 2019-06-20 ENCOUNTER — Other Ambulatory Visit: Payer: Self-pay | Admitting: *Deleted

## 2019-06-20 DIAGNOSIS — J441 Chronic obstructive pulmonary disease with (acute) exacerbation: Secondary | ICD-10-CM | POA: Diagnosis not present

## 2019-06-20 NOTE — Patient Outreach (Signed)
[name]) Care Management  [place]  06/20/2019   Cory Kemp Jun 11, 1941 [id number]  RN Health Coach telephone call to patient.  [place] compliance verified. Per patient he is doing pretty good. Patient is on oxygen and wants to go out [place] but is afraid because of his tanks. RN discussed alternatives for patient as backups. RN discussed calling ahead to medical supply places and having alternative tanks set up. RN also discussed even emergency power outages to make the fire dept aware he is on oxygen that is close to his home. Patient does cough up some mucous. Patient stated that he gets short of breath on physical exertion. He is on oxygen 2-4 liters. Patient is driving self to appointments and errands. Patient does know how to do pursed lip breathing. Patient has agreed to follow up outreach  calls. .   Encounter Medications:  Outpatient Encounter Medications as of 06/20/2019  Medication Sig  . albuterol (PROVENTIL) (2.5 MG/3ML) 0.083% nebulizer solution Take 3 mLs (2.5 mg total) by nebulization every 6 (six) hours as needed for wheezing or shortness of breath.  . Cholecalciferol (VITAMIN D-3) 25 MCG (1000 UT) CAPS Take 1,000 Units by mouth at bedtime.  . Cyanocobalamin (VITAMIN B-12) 5000 MCG TBDP Take 5,000 mcg by mouth daily.   [name] Kitchen escitalopram (LEXAPRO) 5 MG tablet Take 5 mg by mouth daily.  . fluticasone (FLONASE) 50 MCG/ACT nasal spray Place 2 sprays into both nostrils daily. Reduce to 1 spray each nostril daily after stopping Afrin  . ibuprofen (ADVIL) 200 MG tablet Take 400 mg by mouth at bedtime.  [name] Kitchen losartan (COZAAR) 50 MG tablet Take 50 mg by mouth at bedtime.   . OXYGEN Inhale 4 L into the lungs continuous.  . predniSONE (DELTASONE) 10 MG tablet Take 30mg  (three tabs) on day one , then 20mg  ( two tabs) on day two, then 10mg  (one tab) on day three, then stop. (Patient taking differently: Take 10 mg by mouth daily with breakfast. Take 30mg  (three tabs)  on day one , then 20mg  ( two tabs) on day two, then 10mg  (one tab) on day three, then stop.)  . roflumilast (DALIRESP) 500 MCG TABS tablet Take 1 tablet (500 mcg total) by mouth daily.  . simvastatin (ZOCOR) 40 MG tablet Take 40 mg by mouth at bedtime.   . TRELEGY ELLIPTA 100-62.5-25 MCG/INH AEPB INHALE 1 PUFF BY MOUTH EVERY DAY.  . VENTOLIN HFA 108 (90 Base) MCG/ACT inhaler INHALE 2 PUFFS INTO THE LUNG EVERY 4 HOURS AS NEEDED FOR WHEEZING OR SHORTNESS OF BREATH.   No facility-administered encounter medications on file as of 06/20/2019.     Functional Status:  In your present state of health, do you have any difficulty performing the following activities: 06/20/2019 02/20/2019  Hearing? Y Y  Comment hearing aid Hearing aid  Vision? N N  Difficulty concentrating or making decisions? N N  Walking or climbing stairs? Y Y  Comment shortness of breath due to trouble breathing  Dressing or bathing? Y Y  Comment due to the shortness of breath with exertion due to trouble breathing  Doing errands, shopping? N N  Preparing Food and eating ? N N  Using the Toilet? N N  In the past six months, have you accidently leaked urine? Y Y  Do you have problems with loss of bowel control? N N  Managing your Medications? Y Y  Managing your Finances? N N  Housekeeping or managing your Housekeeping? Tempie Donning  Some recent data might be hidden    Fall/Depression Screening: Fall Risk  06/20/2019 02/19/2019  Falls in the past year? 0 0  Number falls in past yr: 0 -  Injury with Fall? 0 0  Risk for fall due to : Impaired balance/gait;Impaired mobility -  Follow up Falls evaluation completed -   PHQ 2/9 Scores 06/20/2019 02/19/2019  PHQ - 2 Score 1 1    Assessment:  Patient is on Oxygen 2-4 liters Patient gets short on breath on exertion Patient does have productive couch of clear sputum Patient is wanting to travel but afraid due to Oxygen needs Patient will benefit from [place] telephonic outreach  for education and support for COPD self management. Plan:  RN sent Clinical key education on  COPD exacerbation RN sent Clinical key education on  COPD and Physical Activity RN sent Clinical key education on  Eating Plan and COPD RN sent Clinical key education on  COPD action form RN discussed action plan RN discussed emergency care with power outages for O2 tanks RN discussed medication adherence RN sent barriers letter and assessment to physician RN will follow up outreach within the month of February

## 2019-07-01 DIAGNOSIS — D649 Anemia, unspecified: Secondary | ICD-10-CM | POA: Diagnosis not present

## 2019-07-01 DIAGNOSIS — I10 Essential (primary) hypertension: Secondary | ICD-10-CM | POA: Diagnosis not present

## 2019-07-01 DIAGNOSIS — R7303 Prediabetes: Secondary | ICD-10-CM | POA: Diagnosis not present

## 2019-07-08 DIAGNOSIS — M8589 Other specified disorders of bone density and structure, multiple sites: Secondary | ICD-10-CM | POA: Diagnosis not present

## 2019-07-08 DIAGNOSIS — I10 Essential (primary) hypertension: Secondary | ICD-10-CM | POA: Diagnosis not present

## 2019-07-08 DIAGNOSIS — M549 Dorsalgia, unspecified: Secondary | ICD-10-CM | POA: Diagnosis not present

## 2019-07-08 DIAGNOSIS — Z780 Asymptomatic menopausal state: Secondary | ICD-10-CM | POA: Diagnosis not present

## 2019-07-08 DIAGNOSIS — R7303 Prediabetes: Secondary | ICD-10-CM | POA: Diagnosis not present

## 2019-07-08 DIAGNOSIS — E7800 Pure hypercholesterolemia, unspecified: Secondary | ICD-10-CM | POA: Diagnosis not present

## 2019-07-08 DIAGNOSIS — R972 Elevated prostate specific antigen [PSA]: Secondary | ICD-10-CM | POA: Diagnosis not present

## 2019-07-08 DIAGNOSIS — D649 Anemia, unspecified: Secondary | ICD-10-CM | POA: Diagnosis not present

## 2019-07-08 DIAGNOSIS — J441 Chronic obstructive pulmonary disease with (acute) exacerbation: Secondary | ICD-10-CM | POA: Diagnosis not present

## 2019-07-08 DIAGNOSIS — J449 Chronic obstructive pulmonary disease, unspecified: Secondary | ICD-10-CM | POA: Diagnosis not present

## 2019-07-20 DIAGNOSIS — J441 Chronic obstructive pulmonary disease with (acute) exacerbation: Secondary | ICD-10-CM | POA: Diagnosis not present

## 2019-07-24 ENCOUNTER — Other Ambulatory Visit: Payer: Self-pay

## 2019-07-24 DIAGNOSIS — R0600 Dyspnea, unspecified: Secondary | ICD-10-CM

## 2019-07-24 DIAGNOSIS — J441 Chronic obstructive pulmonary disease with (acute) exacerbation: Secondary | ICD-10-CM

## 2019-07-24 MED ORDER — VENTOLIN HFA 108 (90 BASE) MCG/ACT IN AERS: INHALATION_SPRAY | RESPIRATORY_TRACT | 2 refills | Status: DC

## 2019-08-08 DIAGNOSIS — J449 Chronic obstructive pulmonary disease, unspecified: Secondary | ICD-10-CM | POA: Diagnosis not present

## 2019-08-08 DIAGNOSIS — J441 Chronic obstructive pulmonary disease with (acute) exacerbation: Secondary | ICD-10-CM | POA: Diagnosis not present

## 2019-08-20 DIAGNOSIS — J441 Chronic obstructive pulmonary disease with (acute) exacerbation: Secondary | ICD-10-CM | POA: Diagnosis not present

## 2019-09-08 DIAGNOSIS — J449 Chronic obstructive pulmonary disease, unspecified: Secondary | ICD-10-CM | POA: Diagnosis not present

## 2019-09-08 DIAGNOSIS — J441 Chronic obstructive pulmonary disease with (acute) exacerbation: Secondary | ICD-10-CM | POA: Diagnosis not present

## 2019-09-16 ENCOUNTER — Encounter (HOSPITAL_COMMUNITY): Payer: Self-pay | Admitting: Emergency Medicine

## 2019-09-16 ENCOUNTER — Emergency Department (HOSPITAL_COMMUNITY)
Admission: EM | Admit: 2019-09-16 | Discharge: 2019-09-17 | Disposition: A | Discharge status: Home / Self Care | Payer: Medicare HMO | Attending: Emergency Medicine | Admitting: Emergency Medicine

## 2019-09-16 ENCOUNTER — Other Ambulatory Visit: Payer: Self-pay

## 2019-09-16 DIAGNOSIS — E119 Type 2 diabetes mellitus without complications: Secondary | ICD-10-CM | POA: Diagnosis not present

## 2019-09-16 DIAGNOSIS — I10 Essential (primary) hypertension: Secondary | ICD-10-CM | POA: Diagnosis not present

## 2019-09-16 DIAGNOSIS — J449 Chronic obstructive pulmonary disease, unspecified: Secondary | ICD-10-CM | POA: Diagnosis not present

## 2019-09-16 DIAGNOSIS — M1120 Other chondrocalcinosis, unspecified site: Secondary | ICD-10-CM | POA: Diagnosis not present

## 2019-09-16 DIAGNOSIS — Z79899 Other long term (current) drug therapy: Secondary | ICD-10-CM | POA: Insufficient documentation

## 2019-09-16 DIAGNOSIS — M25561 Pain in right knee: Secondary | ICD-10-CM | POA: Diagnosis present

## 2019-09-16 DIAGNOSIS — Z87891 Personal history of nicotine dependence: Secondary | ICD-10-CM | POA: Diagnosis not present

## 2019-09-16 DIAGNOSIS — M11261 Other chondrocalcinosis, right knee: Secondary | ICD-10-CM | POA: Diagnosis not present

## 2019-09-16 NOTE — ED Triage Notes (Signed)
Pt endorses right knee pain and swelling for a few days. Pt wears O2 when ambulating.

## 2019-09-16 NOTE — ED Notes (Signed)
Called pt for vitas, no answer x1

## 2019-09-17 ENCOUNTER — Emergency Department (HOSPITAL_COMMUNITY): Payer: Medicare HMO

## 2019-09-17 DIAGNOSIS — M25561 Pain in right knee: Secondary | ICD-10-CM | POA: Diagnosis not present

## 2019-09-17 LAB — SYNOVIAL CELL COUNT + DIFF, W/ CRYSTALS
Eosinophils-Synovial: 0 % (ref 0–1)
Lymphocytes-Synovial Fld: 0 % (ref 0–20)
Monocyte-Macrophage-Synovial Fluid: 11 % — ABNORMAL LOW (ref 50–90)
Neutrophil, Synovial: 89 % — ABNORMAL HIGH (ref 0–25)
WBC, Synovial: 15000 /mm3 — ABNORMAL HIGH (ref 0–200)

## 2019-09-17 LAB — BASIC METABOLIC PANEL
Anion gap: 10 (ref 5–15)
BUN: 23 mg/dL (ref 8–23)
CO2: 26 mmol/L (ref 22–32)
Calcium: 9.2 mg/dL (ref 8.9–10.3)
Chloride: 100 mmol/L (ref 98–111)
Creatinine, Ser: 0.97 mg/dL (ref 0.61–1.24)
GFR calc Af Amer: >60 mL/min (ref >60)
GFR calc non Af Amer: >60 mL/min (ref >60)
Glucose, Bld: 179 mg/dL — ABNORMAL HIGH (ref 70–99)
Potassium: 4 mmol/L (ref 3.5–5.1)
Sodium: 136 mmol/L (ref 135–145)

## 2019-09-17 LAB — CBC WITH DIFFERENTIAL/PLATELET
Abs Immature Granulocytes: 0.04 10*3/uL (ref 0.00–0.07)
Basophils Absolute: 0 10*3/uL (ref 0.0–0.1)
Basophils Relative: 0 %
Eosinophils Absolute: 0 10*3/uL (ref 0.0–0.5)
Eosinophils Relative: 0 %
HCT: 35.2 % — ABNORMAL LOW (ref 39.0–52.0)
Hemoglobin: 10.9 g/dL — ABNORMAL LOW (ref 13.0–17.0)
Immature Granulocytes: 1 %
Lymphocytes Relative: 26 %
Lymphs Abs: 1 10*3/uL (ref 0.7–4.0)
MCH: 27.7 pg (ref 26.0–34.0)
MCHC: 31 g/dL (ref 30.0–36.0)
MCV: 89.6 fL (ref 80.0–100.0)
Monocytes Absolute: 0.9 10*3/uL (ref 0.1–1.0)
Monocytes Relative: 24 %
Neutro Abs: 1.9 10*3/uL (ref 1.7–7.7)
Neutrophils Relative %: 49 %
Platelets: 182 10*3/uL (ref 150–400)
RBC: 3.93 MIL/uL — ABNORMAL LOW (ref 4.22–5.81)
RDW: 15.4 % (ref 11.5–15.5)
WBC: 3.9 10*3/uL — ABNORMAL LOW (ref 4.0–10.5)
nRBC: 0 % (ref 0.0–0.2)

## 2019-09-17 LAB — GRAM STAIN

## 2019-09-17 LAB — SEDIMENTATION RATE: Sed Rate: 66 mm/hr — ABNORMAL HIGH (ref 0–16)

## 2019-09-17 LAB — C-REACTIVE PROTEIN: CRP: 7 mg/dL — ABNORMAL HIGH (ref <1.0)

## 2019-09-17 MED ORDER — COLCHICINE 0.6 MG PO TABS
0.6000 mg | ORAL_TABLET | Freq: Once | ORAL | Status: AC
  Administered 2019-09-17: 0.6 mg via ORAL
  Filled 2019-09-17: qty 1

## 2019-09-17 MED ORDER — COLCHICINE 0.6 MG PO TABS
1.2000 mg | ORAL_TABLET | Freq: Once | ORAL | Status: AC
  Administered 2019-09-17: 1.2 mg via ORAL
  Filled 2019-09-17: qty 2

## 2019-09-17 MED ORDER — LIDOCAINE-EPINEPHRINE (PF) 2 %-1:200000 IJ SOLN
10.0000 mL | Freq: Once | INTRAMUSCULAR | Status: AC
  Administered 2019-09-17: 10 mL via INTRADERMAL
  Filled 2019-09-17: qty 20

## 2019-09-17 MED ORDER — DICLOFENAC SODIUM 1 % EX GEL: 4.0000 g | Freq: Four times a day (QID) | CUTANEOUS | 0 refills | Status: DC

## 2019-09-17 MED ORDER — KETOROLAC TROMETHAMINE 15 MG/ML IJ SOLN
15.0000 mg | Freq: Once | INTRAMUSCULAR | Status: AC
  Administered 2019-09-17: 15 mg via INTRAMUSCULAR
  Filled 2019-09-17: qty 1

## 2019-09-17 NOTE — Discharge Instructions (Signed)
Take tylenol 1000mg (2 extra strength) four times a day.   Use the gel as prescribed.  Follow up with your doctor in the office

## 2019-09-17 NOTE — ED Notes (Signed)
Patient Alert and oriented to baseline. Stable and ambulatory to baseline. Patient verbalized understanding of the discharge instructions.  Patient belongings were taken by the patient.   

## 2019-09-17 NOTE — ED Notes (Signed)
Pt unable to ambulate in hall. Pt stood and limped with two steps in room and stated he could not walk any further, "this is not going to work".

## 2019-09-17 NOTE — ED Provider Notes (Signed)
Cory Kemp Provider Note   CSN: [id number] Arrival date & time: 09/16/19  1719     History Chief Complaint  Patient presents with  . Knee Pain    Cory Kemp is a 79 y.o. male.  79 yo M with a cc of right knee pain.  Going on for the past couple days.  Patient denies any trauma.  States that the knee became acutely swollen and painful.  Worse with range of motion.  He is able to walk on it but it is very painful.  States it is 100 out of 10.  States he has no fevers.  Has had chronic problems with his knee hurting him for at least the past year.  Hurts him off and on usually improves with elevation and Tylenol.  The history is provided by the patient.  Knee Pain Location:  Knee Time since incident:  2 days Injury: no   Knee location:  R knee Pain details:    Quality:  Aching   Radiates to:  R leg   Severity:  Moderate   Onset quality:  Gradual   Timing:  Constant   Progression:  Worsening Chronicity:  New Dislocation: no   Prior injury to area:  No Relieved by:  Nothing Worsened by:  Nothing Associated symptoms: no fever        Past Medical History:  Diagnosis Date  . Alcohol abuse   . Anemia   . Arthritis   . Asthma   . B12 deficiency 11/03/2013  . BPH (benign prostatic hypertrophy)   . COPD (chronic obstructive pulmonary disease) ([place])   . Dementia ([place])    ?  [name] Kitchen Diabetes mellitus ([place]) 01/10/2012  . Femoral hernia, right 03/27/2012  . Glucose intolerance (impaired glucose tolerance)   . Hyperlipidemia   . Hypertension   . Incisional hernias - swiss-cheese-type periumbilical [id number]  . Inguinal hernia - right 01/10/2012  . Leukopenia 01/29/2013  . Monocytosis 01/29/2013  . Normochromic anemia 01/29/2013  . Osteopenia     Patient Active Problem List   Diagnosis Date Noted  . Medication management 03/05/2019  . Acute exacerbation of chronic obstructive pulmonary disease (COPD) ([place]) 02/11/2019  . COPD with acute  exacerbation ([place]) 02/08/2019  . Hypertensive urgency 02/08/2019  . CAP (community acquired pneumonia) 12/25/2018  . Acute on chronic respiratory failure with hypoxia ([place]) 12/25/2018  . Elevated d-dimer 12/25/2018  . Chronic respiratory failure with hypoxia ([place]) 12/13/2018  . Abnormal finding on EKG 01/03/2018  . OSA (obstructive sleep apnea) 06/14/2017  . Sinus tachycardia 04/11/2016  . Essential hypertension 04/11/2016  . COPD (chronic obstructive pulmonary disease) ([place]) 01/11/2016  . B12 deficiency 11/03/2013  . Leukopenia 01/29/2013  . Monocytosis 01/29/2013  . Normochromic anemia 01/29/2013  . Former smoker 03/27/2012  . Diabetes mellitus ([place]) 01/10/2012    Past Surgical History:  Procedure Laterality Date  . HEMORRHOID SURGERY    . HERNIA REPAIR    . INGUINAL HERNIA REPAIR  02/24/2012   Procedure: LAPAROSCOPIC INGUINAL HERNIA;  Surgeon: Adin Hector, MD;  Location: [facility];  Service: General;  Laterality: N/A;  . TUMOR EXCISION     intestinal after SBO ( Benign), appendectomy  . VENTRAL HERNIA REPAIR  02/24/2012   Procedure: LAPAROSCOPIC VENTRAL HERNIA;  Surgeon: Adin Hector, MD;  Location: [facility];  Service: General;  Laterality: N/A;  lysis of adhesions, excision of abdominal wall lipomae       Family History  Problem Relation  Age of Onset  . Lung cancer Father   . Lung cancer Mother   . Diabetes Brother     Social History   Tobacco Use  . Smoking status: Former Smoker    Packs/day: 1.00    Years: 63.00    Pack years: 63.00    Types: Cigarettes    Start date: 08/09/1955    Quit date: 08/08/2018    Years since quitting: 1.1  . Smokeless tobacco: Never Used  . Tobacco comment: reports he stopped at the beginning of this year  Substance Use Topics  . Alcohol use: No    Alcohol/week: 0.0 standard drinks  . Drug use: No    Home Medications Prior to Admission medications   Medication Sig Start Date End Date Taking? Authorizing Provider  albuterol  (PROVENTIL) (2.5 MG/3ML) 0.083% nebulizer solution Take 3 mLs (2.5 mg total) by nebulization every 6 (six) hours as needed for wheezing or shortness of breath. 01/03/18  Yes Lauraine Rinne, NP  Cholecalciferol (VITAMIN D-3) 25 MCG (1000 UT) CAPS Take 1,000 Units by mouth at bedtime.   Yes [provider]  Cyanocobalamin (VITAMIN B-12) 5000 MCG TBDP Take 5,000 mcg by mouth at bedtime.    Yes [provider]  cyclobenzaprine (FLEXERIL) 10 MG tablet Take 10 mg by mouth at bedtime as needed for muscle spasms.  08/19/19  Yes [provider]  DALIRESP 250 MCG TABS Take 250 mg by mouth at bedtime. 08/27/19  Yes [provider]  escitalopram (LEXAPRO) 10 MG tablet Take 10 mg by mouth at bedtime.  08/23/19  Yes [provider]  fluticasone (FLONASE) 50 MCG/ACT nasal spray Place 2 sprays into both nostrils daily. Reduce to 1 spray each nostril daily after stopping Afrin Patient taking differently: Place 2 sprays into both nostrils daily.  03/20/19  Yes Mannam, Praveen, MD  ibuprofen (ADVIL) 200 MG tablet Take 400 mg by mouth at bedtime.   Yes [provider]  losartan (COZAAR) 50 MG tablet Take 50 mg by mouth at bedtime.  12/26/11  Yes [provider]  simvastatin (ZOCOR) 40 MG tablet Take 40 mg by mouth at bedtime.  12/27/11  Yes [provider]  TRELEGY ELLIPTA 100-62.5-25 MCG/INH AEPB INHALE 1 PUFF BY MOUTH EVERY DAY. Patient taking differently: Inhale 1 puff into the lungs daily.  05/15/19  Yes Mannam, Praveen, MD  VENTOLIN HFA 108 (90 Base) MCG/ACT inhaler INHALE 2 PUFFS INTO THE LUNG EVERY 4 HOURS AS NEEDED FOR WHEEZING OR SHORTNESS OF BREATH. Patient taking differently: Inhale 2 puffs into the lungs every 4 (four) hours as needed for wheezing or shortness of breath.  07/24/19  Yes Mannam, Praveen, MD  diclofenac Sodium (VOLTAREN) 1 % GEL Apply 4 g topically 4 (four) times daily. 09/17/19   Deno Etienne, DO  OXYGEN Inhale 4 L into the lungs  continuous.    [provider]  predniSONE (DELTASONE) 10 MG tablet Take 30mg  (three tabs) on day one , then 20mg  ( two tabs) on day two, then 10mg  (one tab) on day three, then stop. Patient not taking: Reported on 09/17/2019 02/13/19   Florencia Reasons, MD  roflumilast (DALIRESP) 500 MCG TABS tablet Take 1 tablet (500 mcg total) by mouth daily. Patient not taking: Reported on 09/17/2019 05/15/19   [name], MD    Allergies    Adhesive [tape]  Review of Systems   Review of Systems  Constitutional: Negative for chills and fever.  HENT: Negative for congestion and facial  swelling.   Eyes: Negative for discharge and visual disturbance.  Respiratory: Negative for shortness of breath.   Cardiovascular: Negative for chest pain and palpitations.  Gastrointestinal: Negative for abdominal pain, diarrhea and vomiting.  Musculoskeletal: Positive for arthralgias. Negative for myalgias.  Skin: Negative for color change and rash.  Neurological: Negative for tremors, syncope and headaches.  Psychiatric/Behavioral: Negative for confusion and dysphoric mood.    Physical Exam Updated Vital Signs BP 128/62 (BP Location: Right Arm)   Pulse (!) 102   Temp 98.5 F (36.9 C) (Oral)   Resp 16   Ht 6\' 1"  (1.854 m)   Wt 81.6 kg   SpO2 99%   BMI 23.75 kg/m   Physical Exam Vitals and nursing note reviewed.  Constitutional:      Appearance: He is well-developed.  HENT:     Head: Normocephalic and atraumatic.  Eyes:     Pupils: Pupils are equal, round, and reactive to light.  Neck:     Vascular: No JVD.  Cardiovascular:     Rate and Rhythm: Normal rate and regular rhythm.     Heart sounds: No murmur. No friction rub. No gallop.   Pulmonary:     Effort: No respiratory distress.     Breath sounds: No wheezing.  Abdominal:     General: There is no distension.     Tenderness: There is no guarding or rebound.  Musculoskeletal:        General: Swelling present. Normal range of motion.      Cervical back: Normal range of motion and neck supple.     Comments: R knee pain with warmth and edema.  Pain with ROM.  PMS intact distally.   Skin:    Coloration: Skin is not pale.     Findings: No rash.  Neurological:     Mental Status: He is alert and oriented to person, place, and time.  Psychiatric:        Behavior: Behavior normal.     ED Results / Procedures / Treatments   Labs (all labs ordered are listed, but only abnormal results are displayed) Labs Reviewed  SYNOVIAL CELL COUNT + DIFF, W/ CRYSTALS - Abnormal; Notable for the following components:      Result Value   Appearance-Synovial TURBID (*)    WBC, Synovial 15,000 (*)    Neutrophil, Synovial 89 (*)    Monocyte-Macrophage-Synovial Fluid 11 (*)    All other components within normal limits  CBC WITH DIFFERENTIAL/PLATELET - Abnormal; Notable for the following components:   WBC 3.9 (*)    RBC 3.93 (*)    Hemoglobin 10.9 (*)    HCT 35.2 (*)    All other components within normal limits  BASIC METABOLIC PANEL - Abnormal; Notable for the following components:   Glucose, Bld 179 (*)    All other components within normal limits  SEDIMENTATION RATE - Abnormal; Notable for the following components:   Sed Rate 66 (*)    All other components within normal limits  C-REACTIVE PROTEIN - Abnormal; Notable for the following components:   CRP 7.0 (*)    All other components within normal limits  GRAM STAIN  CULTURE, BODY FLUID-BOTTLE  GLUCOSE, BODY FLUID OTHER  PROTEIN, BODY FLUID (OTHER)    EKG None  Radiology DG Knee Complete 4 Views Right  Result Date: 09/17/2019 CLINICAL DATA:  Right knee pain EXAM: RIGHT KNEE - COMPLETE 4+ VIEW COMPARISON:  None. FINDINGS: Some irregular lucency seen along the anterior articular surface  of the medial femoral condyle. Stranding noted in Hoffa's fat pad. Moderate suprapatellar joint effusion is seen. Minimal circumferential swelling. Mild tricompartmental degenerative  changes. Bilateral meniscal chondrocalcinosis is present. Vascular calcium noted in the posterior soft tissues. IMPRESSION: 1. Irregular lucency along the anterior articular surface of medial femoral condyle in the setting of moderate effusion and stranding in Hoffa's fat pad could reflect an impaction type injury. 2. Mild tricompartmental degenerative changes. 3. Meniscal chondrocalcinosis of the medial and lateral compartments most often seen with calcium pyrophosphate deposition. Electronically Signed   By: Lovena Le M.D.   On: 09/17/2019 01:25    Procedures .Joint Aspiration/Arthrocentesis  Date/Time: 09/17/2019 1:52 AM Performed by: Deno Etienne, DO Authorized by: Deno Etienne, DO   Consent:    Consent obtained:  Verbal   Consent given by:  Patient   Risks discussed:  Bleeding, infection, incomplete drainage and nerve damage   Alternatives discussed:  No treatment, delayed treatment, alternative treatment and observation Location:    Location:  Knee   Knee:  R knee Anesthesia (see MAR for exact dosages):    Anesthesia method:  Local infiltration   Local anesthetic:  Lidocaine 2% WITH epi Procedure details:    Needle gauge: 21G.   Ultrasound guidance: no     Approach:  Lateral   Aspirate amount:  20   Aspirate characteristics:  Yellow, serous and clear   Steroid injected: no     Specimen collected: no   Post-procedure details:    Dressing:  Adhesive bandage   Patient tolerance of procedure:  Tolerated well, no immediate complications   (including critical care time)  Medications Ordered in ED Medications  ketorolac (TORADOL) 15 MG/ML injection 15 mg (has no administration in time range)  lidocaine-EPINEPHrine (XYLOCAINE W/EPI) 2 %-1:200000 (PF) injection 10 mL (10 mLs Intradermal Given 09/17/19 0059)  colchicine tablet 1.2 mg (1.2 mg Oral Given 09/17/19 0207)  colchicine tablet 0.6 mg (0.6 mg Oral Given 09/17/19 0319)    ED Course  I have reviewed the triage vital signs and the  nursing notes.  Pertinent labs & imaging results that were available during my care of the patient were reviewed by me and considered in my medical decision making (see chart for details).    MDM Rules/Calculators/A&P                      79 yo M with a chief complaint of right knee pain.  Patient's knee is significantly swollen and he has a lot of pain with very minimal range of motion.  Likely just worsening of his arthritis that the patient describing 10 out of 10 pain I offered to perform arthrocentesis to evaluate for septic arthritis.  Arthrocentesis is not consistent with septic arthritis.  More consistent with pseudogout.  Patient was given colchicine here in the ED.  Has had some modest improvement.  Will prescribe diclofenac gel.  Have him follow-up with his doctor in the office.  6:24 AM:  I have discussed the diagnosis/risks/treatment options with the patient and believe the pt to be eligible for discharge home to follow-up with PCP. We also discussed returning to the ED immediately if new or worsening sx occur. We discussed the sx which are most concerning (e.g., sudden worsening pain, fever, inability to tolerate by mouth) that necessitate immediate return. Medications administered to the patient during their visit and any new prescriptions provided to the patient are listed below.  Medications given during this visit Medications  ketorolac (TORADOL) 15 MG/ML injection 15 mg (has no administration in time range)  lidocaine-EPINEPHrine (XYLOCAINE W/EPI) 2 %-1:200000 (PF) injection 10 mL (10 mLs Intradermal Given 09/17/19 0059)  colchicine tablet 1.2 mg (1.2 mg Oral Given 09/17/19 0207)  colchicine tablet 0.6 mg (0.6 mg Oral Given 09/17/19 0319)     The patient appears reasonably screen and/or stabilized for discharge and I doubt any other medical condition or other [facility] requiring further screening, evaluation, or treatment in the ED at this time prior to discharge.    Final Clinical  Impression(s) / ED Diagnoses Final diagnoses:  Pseudogout    Rx / DC Orders ED Discharge Orders         Ordered    diclofenac Sodium (VOLTAREN) 1 % GEL  4 times daily     09/17/19 0622           Deno Etienne, DO 09/17/19 [phone]

## 2019-09-18 LAB — GLUCOSE, BODY FLUID OTHER: Glucose, Body Fluid Other: 138 mg/dL

## 2019-09-18 LAB — PROTEIN, BODY FLUID (OTHER): Total Protein, Body Fluid Other: 5.3 g/dL

## 2019-09-19 ENCOUNTER — Other Ambulatory Visit: Payer: Self-pay | Admitting: *Deleted

## 2019-09-19 NOTE — Patient Outreach (Signed)
[place] [facility]) Care Management  09/19/2019  Cory Kemp 26-Nov-1940 [id number]   RN Health Coach attempted follow up outreach call to patient.  Patient was unavailable. HIPPA compliance voicemail message left with return callback number.  Plan: RN will call patient again within 30 days.  [place] Care Management [phone]

## 2019-09-20 DIAGNOSIS — J441 Chronic obstructive pulmonary disease with (acute) exacerbation: Secondary | ICD-10-CM | POA: Diagnosis not present

## 2019-09-22 LAB — CULTURE, BODY FLUID W GRAM STAIN -BOTTLE
Culture: NO GROWTH
Special Requests: ADEQUATE

## 2019-09-23 ENCOUNTER — Other Ambulatory Visit: Payer: Self-pay | Admitting: *Deleted

## 2019-09-23 NOTE — Patient Outreach (Signed)
[place] [facility]) Care Management  09/23/2019  TAJIDDIN SCHIELKE 10-11-1940 [id number]   RN Health Coach attempted follow up outreach call to patient.  Patient was unavailable. HIPPA compliance voicemail message left with return callback number.  Plan: RN will call patient again within 30 days.  [place] Care Management [phone]

## 2019-10-06 DIAGNOSIS — J441 Chronic obstructive pulmonary disease with (acute) exacerbation: Secondary | ICD-10-CM | POA: Diagnosis not present

## 2019-10-06 DIAGNOSIS — J449 Chronic obstructive pulmonary disease, unspecified: Secondary | ICD-10-CM | POA: Diagnosis not present

## 2019-10-07 DIAGNOSIS — R972 Elevated prostate specific antigen [PSA]: Secondary | ICD-10-CM | POA: Diagnosis not present

## 2019-10-07 DIAGNOSIS — N390 Urinary tract infection, site not specified: Secondary | ICD-10-CM | POA: Diagnosis not present

## 2019-10-07 DIAGNOSIS — D649 Anemia, unspecified: Secondary | ICD-10-CM | POA: Diagnosis not present

## 2019-10-07 DIAGNOSIS — R319 Hematuria, unspecified: Secondary | ICD-10-CM | POA: Diagnosis not present

## 2019-10-07 DIAGNOSIS — I10 Essential (primary) hypertension: Secondary | ICD-10-CM | POA: Diagnosis not present

## 2019-10-07 DIAGNOSIS — R7303 Prediabetes: Secondary | ICD-10-CM | POA: Diagnosis not present

## 2019-10-07 DIAGNOSIS — Z125 Encounter for screening for malignant neoplasm of prostate: Secondary | ICD-10-CM | POA: Diagnosis not present

## 2019-10-14 DIAGNOSIS — J449 Chronic obstructive pulmonary disease, unspecified: Secondary | ICD-10-CM | POA: Diagnosis not present

## 2019-10-14 DIAGNOSIS — E7800 Pure hypercholesterolemia, unspecified: Secondary | ICD-10-CM | POA: Diagnosis not present

## 2019-10-14 DIAGNOSIS — R3129 Other microscopic hematuria: Secondary | ICD-10-CM | POA: Diagnosis not present

## 2019-10-14 DIAGNOSIS — D649 Anemia, unspecified: Secondary | ICD-10-CM | POA: Diagnosis not present

## 2019-10-14 DIAGNOSIS — I10 Essential (primary) hypertension: Secondary | ICD-10-CM | POA: Diagnosis not present

## 2019-10-14 DIAGNOSIS — L989 Disorder of the skin and subcutaneous tissue, unspecified: Secondary | ICD-10-CM | POA: Diagnosis not present

## 2019-10-14 DIAGNOSIS — C61 Malignant neoplasm of prostate: Secondary | ICD-10-CM | POA: Diagnosis not present

## 2019-10-14 DIAGNOSIS — R69 Illness, unspecified: Secondary | ICD-10-CM | POA: Diagnosis not present

## 2019-10-14 DIAGNOSIS — Z720 Tobacco use: Secondary | ICD-10-CM | POA: Diagnosis not present

## 2019-10-14 DIAGNOSIS — R7303 Prediabetes: Secondary | ICD-10-CM | POA: Diagnosis not present

## 2019-10-17 ENCOUNTER — Other Ambulatory Visit: Payer: Self-pay | Admitting: *Deleted

## 2019-10-17 NOTE — Patient Outreach (Addendum)
[place] [facility]) Care Management  [place]  10/17/2019   JERIMAH WITUCKI 24-Jun-1941 [id number]  RN Health Coach telephone call to patient.  [place] compliance verified. Per patient he is doing good. He stated that his oxygen saturation is running between 91-95%.When it is 91% he sits down and does purse lip breathing and coughs. Patient stated that helps it to go up. Per patient he is taking his medications as per ordered. He is able to afford is medications.He does not have any food needs he is able to pay all bills now. He has received both COVID vaccines. He is using his oxygen. Per patient he is having some swelling in his knee. He rubs it down with the cream he was given and uses his crutches for bad flare ups.  No recent falls. He is still planning to take a trip to see his friends out [place] when he can. He is waiting for the COVID restrictions to be lifted. Patient has agreed to follow up outreach calls,  Encounter Medications:  Outpatient Encounter Medications as of 10/17/2019  Medication Sig  . albuterol (PROVENTIL) (2.5 MG/3ML) 0.083% nebulizer solution Take 3 mLs (2.5 mg total) by nebulization every 6 (six) hours as needed for wheezing or shortness of breath.  . OXYGEN Inhale 4 L into the lungs continuous.  . TRELEGY ELLIPTA 100-62.5-25 MCG/INH AEPB INHALE 1 PUFF BY MOUTH EVERY DAY. (Patient taking differently: Inhale 1 puff into the lungs daily. )  . VENTOLIN HFA 108 (90 Base) MCG/ACT inhaler INHALE 2 PUFFS INTO THE LUNG EVERY 4 HOURS AS NEEDED FOR WHEEZING OR SHORTNESS OF BREATH. (Patient taking differently: Inhale 2 puffs into the lungs every 4 (four) hours as needed for wheezing or shortness of breath. )  . Cholecalciferol (VITAMIN D-3) 25 MCG (1000 UT) CAPS Take 1,000 Units by mouth at bedtime.  . Cyanocobalamin (VITAMIN B-12) 5000 MCG TBDP Take 5,000 mcg by mouth at bedtime.   . cyclobenzaprine (FLEXERIL) 10 MG tablet Take 10 mg by mouth at bedtime as needed for  muscle spasms.   [name] Kitchen DALIRESP 250 MCG TABS Take 250 mg by mouth at bedtime.  . diclofenac Sodium (VOLTAREN) 1 % GEL Apply 4 g topically 4 (four) times daily.  [name] Kitchen escitalopram (LEXAPRO) 10 MG tablet Take 10 mg by mouth at bedtime.   . fluticasone (FLONASE) 50 MCG/ACT nasal spray Place 2 sprays into both nostrils daily. Reduce to 1 spray each nostril daily after stopping Afrin (Patient taking differently: Place 2 sprays into both nostrils daily. )  . ibuprofen (ADVIL) 200 MG tablet Take 400 mg by mouth at bedtime.  [name] Kitchen losartan (COZAAR) 50 MG tablet Take 50 mg by mouth at bedtime.   . predniSONE (DELTASONE) 10 MG tablet Take '30mg'$  (three tabs) on day one , then '20mg'$  ( two tabs) on day two, then '10mg'$  (one tab) on day three, then stop. (Patient not taking: Reported on 09/17/2019)  . roflumilast (DALIRESP) 500 MCG TABS tablet Take 1 tablet (500 mcg total) by mouth daily. (Patient not taking: Reported on 09/17/2019)  . simvastatin (ZOCOR) 40 MG tablet Take 40 mg by mouth at bedtime.    No facility-administered encounter medications on file as of 10/17/2019.    Functional Status:  In your present state of health, do you have any difficulty performing the following activities: 06/20/2019 02/20/2019  Hearing? Y Y  Comment hearing aid Hearing aid  Vision? N N  Difficulty concentrating or making decisions? N N  Walking or  climbing stairs? Y Y  Comment shortness of breath due to trouble breathing  Dressing or bathing? Y Y  Comment due to the shortness of breath with exertion due to trouble breathing  Doing errands, shopping? N N  Preparing Food and eating ? N N  Using the Toilet? N N  In the past six months, have you accidently leaked urine? Y Y  Do you have problems with loss of bowel control? N N  Managing your Medications? Y Y  Managing your Finances? N N  Housekeeping or managing your Housekeeping? Y Y  Some recent data might be hidden    Fall/Depression Screening: Fall Risk  10/17/2019 06/20/2019  02/19/2019  Falls in the past year? 0 0 0  Number falls in past yr: 0 0 -  Injury with Fall? 0 0 0  Risk for fall due to : Impaired mobility Impaired balance/gait;Impaired mobility -  Risk for fall due to: Comment at times knee swells - -  Follow up Falls evaluation completed;Falls prevention discussed Falls evaluation completed -   PHQ 2/9 Scores 10/17/2019 06/20/2019 02/19/2019  PHQ - 2 Score 0 1 1   THN CM Care Plan Problem One     Most Recent Value  Care Plan Problem One  Knowledge Deficit in Self management of COPD  Role Documenting the Problem One  [place] for Problem One  Active  THN Long Term Goal   Patient will not have a COPD exacerbation within the next 90 days  THN Long Term Goal Start Date  10/17/19  Interventions for Problem One Long Term Goal  RN reiterates zones, action plan . RN reiterates medication adherence. RN woll reiterate with each outreach.  THN CM Short Term Goal #1   Patient will have a better understanding of COPD and physical activity within the next 30 days  THN CM Short Term Goal #1 Met Date  10/17/19  THN CM Short Term Goal #2   Patient will verbalize understanding Eating plan and COPD within the next 30 days  THN CM Short Term Goal #2 Met Date  10/17/19  [facility] CM Short Term Goal #3  Patient will verbalize understanding health maintenance witin the next 30 days  THN CM Short Term Goal #3 Start Date  10/17/19  Interventions for Short Tern Goal #3  RN discussed health maintenace care. RN discussed the COVID vaccine. Patient has now received both injections. RN discussed eye care. RN will follow up with further discussion      Assessment:  He has received both COVID vaccines He is able to afford his medication, food and bills at this time No recent falls No recent ED or admission for COPD exacerbation  Plan:  RN discussed health maintenance RN discussed COVID vaccine RN discussed COPD exacerbations RN discussed medication  adherence Patient will continue to use pandemic precautions RN will follow up outreach within the month of June  Ned Kakar [place] Management [phone]

## 2019-10-18 DIAGNOSIS — J441 Chronic obstructive pulmonary disease with (acute) exacerbation: Secondary | ICD-10-CM | POA: Diagnosis not present

## 2019-10-24 DIAGNOSIS — Z87891 Personal history of nicotine dependence: Secondary | ICD-10-CM | POA: Diagnosis not present

## 2019-10-24 DIAGNOSIS — Z8546 Personal history of malignant neoplasm of prostate: Secondary | ICD-10-CM | POA: Diagnosis not present

## 2019-10-24 DIAGNOSIS — R319 Hematuria, unspecified: Secondary | ICD-10-CM | POA: Diagnosis not present

## 2019-10-24 DIAGNOSIS — Z9079 Acquired absence of other genital organ(s): Secondary | ICD-10-CM | POA: Diagnosis not present

## 2019-11-06 DIAGNOSIS — J449 Chronic obstructive pulmonary disease, unspecified: Secondary | ICD-10-CM | POA: Diagnosis not present

## 2019-11-06 DIAGNOSIS — J441 Chronic obstructive pulmonary disease with (acute) exacerbation: Secondary | ICD-10-CM | POA: Diagnosis not present

## 2019-11-18 DIAGNOSIS — J441 Chronic obstructive pulmonary disease with (acute) exacerbation: Secondary | ICD-10-CM | POA: Diagnosis not present

## 2019-11-28 ENCOUNTER — Emergency Department (HOSPITAL_BASED_OUTPATIENT_CLINIC_OR_DEPARTMENT_OTHER)
Admission: EM | Admit: 2019-11-28 | Discharge: 2019-11-28 | Disposition: A | Discharge status: Home / Self Care | Payer: Medicare HMO | Attending: Emergency Medicine | Admitting: Emergency Medicine

## 2019-11-28 ENCOUNTER — Encounter (HOSPITAL_BASED_OUTPATIENT_CLINIC_OR_DEPARTMENT_OTHER): Payer: Self-pay

## 2019-11-28 ENCOUNTER — Other Ambulatory Visit: Payer: Self-pay

## 2019-11-28 ENCOUNTER — Emergency Department (HOSPITAL_BASED_OUTPATIENT_CLINIC_OR_DEPARTMENT_OTHER): Payer: Medicare HMO

## 2019-11-28 DIAGNOSIS — Z79899 Other long term (current) drug therapy: Secondary | ICD-10-CM | POA: Insufficient documentation

## 2019-11-28 DIAGNOSIS — I10 Essential (primary) hypertension: Secondary | ICD-10-CM | POA: Diagnosis not present

## 2019-11-28 DIAGNOSIS — R52 Pain, unspecified: Secondary | ICD-10-CM

## 2019-11-28 DIAGNOSIS — Z87891 Personal history of nicotine dependence: Secondary | ICD-10-CM | POA: Insufficient documentation

## 2019-11-28 DIAGNOSIS — J449 Chronic obstructive pulmonary disease, unspecified: Secondary | ICD-10-CM | POA: Insufficient documentation

## 2019-11-28 DIAGNOSIS — M25561 Pain in right knee: Secondary | ICD-10-CM | POA: Diagnosis not present

## 2019-11-28 DIAGNOSIS — E785 Hyperlipidemia, unspecified: Secondary | ICD-10-CM | POA: Insufficient documentation

## 2019-11-28 DIAGNOSIS — E119 Type 2 diabetes mellitus without complications: Secondary | ICD-10-CM | POA: Diagnosis not present

## 2019-11-28 DIAGNOSIS — Z91048 Other nonmedicinal substance allergy status: Secondary | ICD-10-CM | POA: Diagnosis not present

## 2019-11-28 DIAGNOSIS — M25461 Effusion, right knee: Secondary | ICD-10-CM | POA: Diagnosis not present

## 2019-11-28 DIAGNOSIS — Z9981 Dependence on supplemental oxygen: Secondary | ICD-10-CM | POA: Diagnosis not present

## 2019-11-28 DIAGNOSIS — Z8739 Personal history of other diseases of the musculoskeletal system and connective tissue: Secondary | ICD-10-CM

## 2019-11-28 MED ORDER — COLCHICINE 0.6 MG PO TABS
1.2000 mg | ORAL_TABLET | Freq: Once | ORAL | Status: AC
  Administered 2019-11-28: 1.2 mg via ORAL
  Filled 2019-11-28: qty 2

## 2019-11-28 MED ORDER — LIDOCAINE HCL 2 % IJ SOLN
20.0000 mL | Freq: Once | INTRAMUSCULAR | Status: AC
  Administered 2019-11-28: 400 mg
  Filled 2019-11-28: qty 20

## 2019-11-28 MED ORDER — COLCHICINE 0.6 MG PO TABS: 0.6000 mg | ORAL_TABLET | Freq: Every day | ORAL | 0 refills | Status: DC

## 2019-11-28 NOTE — Discharge Instructions (Addendum)
Please follow-up with and establish care with a orthopedic doctor. Please call [facility] to make an appointment.  Please take colchicine as prescribed (one dose tonight and once per day for two days). Use ibuprofen 400-600 mg three times per day (every 6 hours) as needed for pain.   Return to ED if you have any new or concerning symptoms including fevers, nausea, vomiting.  Or any other new or concerning symptoms.

## 2019-11-28 NOTE — ED Triage Notes (Signed)
Pt c/o pain/swelling to right knee x "months"-denies injury-to triage in w/c-NAD-pt on O2 [place] 3L

## 2019-11-28 NOTE — ED Provider Notes (Signed)
[facility] DEPARTMENT Provider Note   CSN: [id number] Arrival date & time: 11/28/19  1538     History Chief Complaint  Patient presents with  . Knee Pain    Cory Kemp is a 79 y.o. male.  HPI  Patient is a 79 year old male with a history of pseudogout as well as DM, HLD, COPD on oxygen, alcohol abuse, arthritis of bilateral knees.  He has had approximately 9 months of intermittent right knee pain and states that for the past several months has been worsening and states that it feels swollen.  He states it is worse with touch and movement.  Patient describes the pain as severely achy, constant, worsening.  He denies any fevers, chills, redness, fatigue, malaise.  He denies any chest pain shortness of breath nausea or vomiting.  States he feels well today apart from his pain.  Patient was seen several months ago for similar symptoms by emergency medicine provider and had arthrocentesis done at that time.  His synovial fluid showed findings consistent with CPPD and he was diagnosed with pseudogout.  He does have a history of mild tachycardia.  He states that this is secondary to pain today.  He states that his pain is 9/10.  He states that he had some relief with his symptoms at last visit with knee arthrocentesis.      Past Medical History:  Diagnosis Date  . Alcohol abuse   . Anemia   . Arthritis   . Asthma   . B12 deficiency 11/03/2013  . BPH (benign prostatic hypertrophy)   . COPD (chronic obstructive pulmonary disease) ([place])   . Dementia ([place])    ?  [name] Kitchen Diabetes mellitus ([place]) 01/10/2012  . Femoral hernia, right 03/27/2012  . Glucose intolerance (impaired glucose tolerance)   . Hyperlipidemia   . Hypertension   . Incisional hernias - swiss-cheese-type periumbilical [id number]  . Inguinal hernia - right 01/10/2012  . Leukopenia 01/29/2013  . Monocytosis 01/29/2013  . Normochromic anemia 01/29/2013  . Osteopenia     Patient Active Problem List   Diagnosis  Date Noted  . Medication management 03/05/2019  . Acute exacerbation of chronic obstructive pulmonary disease (COPD) ([place]) 02/11/2019  . COPD with acute exacerbation ([place]) 02/08/2019  . Hypertensive urgency 02/08/2019  . CAP (community acquired pneumonia) 12/25/2018  . Acute on chronic respiratory failure with hypoxia ([place]) 12/25/2018  . Elevated d-dimer 12/25/2018  . Chronic respiratory failure with hypoxia ([place]) 12/13/2018  . Abnormal finding on EKG 01/03/2018  . OSA (obstructive sleep apnea) 06/14/2017  . Sinus tachycardia 04/11/2016  . Essential hypertension 04/11/2016  . COPD (chronic obstructive pulmonary disease) ([place]) 01/11/2016  . B12 deficiency 11/03/2013  . Leukopenia 01/29/2013  . Monocytosis 01/29/2013  . Normochromic anemia 01/29/2013  . Former smoker 03/27/2012  . Diabetes mellitus ([place]) 01/10/2012    Past Surgical History:  Procedure Laterality Date  . HEMORRHOID SURGERY    . HERNIA REPAIR    . INGUINAL HERNIA REPAIR  02/24/2012   Procedure: LAPAROSCOPIC INGUINAL HERNIA;  Surgeon: Adin Hector, MD;  Location: [facility];  Service: General;  Laterality: N/A;  . TUMOR EXCISION     intestinal after SBO ( Benign), appendectomy  . VENTRAL HERNIA REPAIR  02/24/2012   Procedure: LAPAROSCOPIC VENTRAL HERNIA;  Surgeon: Adin Hector, MD;  Location: [facility];  Service: General;  Laterality: N/A;  lysis of adhesions, excision of abdominal wall lipomae       Family History  Problem Relation Age  of Onset  . Lung cancer Father   . Lung cancer Mother   . Diabetes Brother     Social History   Tobacco Use  . Smoking status: Former Smoker    Packs/day: 1.00    Years: 63.00    Pack years: 63.00    Types: Cigarettes    Start date: 08/09/1955    Quit date: 08/08/2018    Years since quitting: 1.3  . Smokeless tobacco: Never Used  Substance Use Topics  . Alcohol use: No    Alcohol/week: 0.0 standard drinks  . Drug use: No    Home Medications Prior to Admission  medications   Medication Sig Start Date End Date Taking? Authorizing Provider  albuterol (PROVENTIL) (2.5 MG/3ML) 0.083% nebulizer solution Take 3 mLs (2.5 mg total) by nebulization every 6 (six) hours as needed for wheezing or shortness of breath. 01/03/18   Lauraine Rinne, NP  Cholecalciferol (VITAMIN D-3) 25 MCG (1000 UT) CAPS Take 1,000 Units by mouth at bedtime.    [provider]  colchicine 0.6 MG tablet Take 1 tablet (0.6 mg total) by mouth daily for 3 doses. 11/28/19 12/01/19  Tedd Sias, PA  Cyanocobalamin (VITAMIN B-12) 5000 MCG TBDP Take 5,000 mcg by mouth at bedtime.     [provider]  cyclobenzaprine (FLEXERIL) 10 MG tablet Take 10 mg by mouth at bedtime as needed for muscle spasms.  08/19/19   [provider]  DALIRESP 250 MCG TABS Take 250 mg by mouth at bedtime. 08/27/19   [provider]  diclofenac Sodium (VOLTAREN) 1 % GEL Apply 4 g topically 4 (four) times daily. 09/17/19   Deno Etienne, DO  escitalopram (LEXAPRO) 10 MG tablet Take 10 mg by mouth at bedtime.  08/23/19   [provider]  fluticasone (FLONASE) 50 MCG/ACT nasal spray Place 2 sprays into both nostrils daily. Reduce to 1 spray each nostril daily after stopping Afrin Patient taking differently: Place 2 sprays into both nostrils daily.  03/20/19   Mannam, Hart Robinsons, MD  ibuprofen (ADVIL) 200 MG tablet Take 400 mg by mouth at bedtime.    [provider]  losartan (COZAAR) 50 MG tablet Take 50 mg by mouth at bedtime.  12/26/11   [provider]  OXYGEN Inhale 4 L into the lungs continuous.    [provider]  predniSONE (DELTASONE) 10 MG tablet Take 30mg  (three tabs) on day one , then 20mg  ( two tabs) on day two, then 10mg  (one tab) on day three, then stop. Patient not taking: Reported on 09/17/2019 02/13/19   Florencia Reasons, MD  roflumilast (DALIRESP) 500 MCG TABS tablet Take 1 tablet (500 mcg total) by mouth daily. Patient not taking: Reported on 09/17/2019 05/15/19    [name], MD  simvastatin (ZOCOR) 40 MG tablet Take 40 mg by mouth at bedtime.  12/27/11   [provider]  TRELEGY ELLIPTA 100-62.5-25 MCG/INH AEPB INHALE 1 PUFF BY MOUTH EVERY DAY. Patient taking differently: Inhale 1 puff into the lungs daily.  05/15/19   Mannam, Praveen, MD  VENTOLIN HFA 108 (90 Base) MCG/ACT inhaler INHALE 2 PUFFS INTO THE LUNG EVERY 4 HOURS AS NEEDED FOR WHEEZING OR SHORTNESS OF BREATH. Patient taking differently: Inhale 2 puffs into the lungs every 4 (four) hours as needed for wheezing or shortness of breath.  07/24/19   [name], MD    Allergies    Adhesive [tape]  Review of Systems   Review of Systems  Constitutional: Negative for  chills and fever.  HENT: Negative for congestion.   Eyes: Negative for pain.  Respiratory: Negative for cough and shortness of breath.   Cardiovascular: Negative for chest pain and leg swelling.  Gastrointestinal: Negative for abdominal pain and vomiting.  Genitourinary: Negative for dysuria.  Musculoskeletal: Positive for joint swelling. Negative for myalgias.       Knee pain right  Skin: Negative for rash.  Neurological: Negative for dizziness and headaches.    Physical Exam Updated Vital Signs BP (!) 146/91 (BP Location: Right Arm)   Pulse (!) 103   Temp 98.1 F (36.7 C) (Oral)   Resp 20   Ht 6\' 1"  (1.854 m)   Wt 85.3 kg   SpO2 98%   BMI 24.80 kg/m   Physical Exam Vitals and nursing note reviewed.  Constitutional:      General: He is not in acute distress. HENT:     Head: Normocephalic and atraumatic.     Nose: Nose normal.     Mouth/Throat:     Mouth: Mucous membranes are moist.  Eyes:     General: No scleral icterus. Cardiovascular:     Rate and Rhythm: Normal rate and regular rhythm.     Pulses: Normal pulses.     Heart sounds: Normal heart sounds.     Comments: Toes with good cap refill, DP/PT pulses intact.  Symmetric. Pulmonary:     Effort: Pulmonary effort is normal. No  respiratory distress.     Breath sounds: No wheezing.  Abdominal:     Palpations: Abdomen is soft.     Tenderness: There is no abdominal tenderness. There is no guarding or rebound.  Musculoskeletal:     Cervical back: Normal range of motion.     Comments: Swelling to the right knee has diffuse around the joint.  Tenderness to palpation of the lateral knee at the joint line.  No tenderness to palpation of the medial knee.  No redness.  There is significant swelling however.  Positive ballottement and balloon test.  Patient is able to flex knee to nearly 90 degrees.  Painful, slow extension and flexion passively.  Patient declines to actively move/range knee secondary to pain.  Skin:    General: Skin is warm and dry.     Capillary Refill: Capillary refill takes less than 2 seconds.     Comments: No redness, ecchymoses, evidence of abscess or cellulitis present.  Neurological:     Mental Status: He is alert. Mental status is at baseline.     Comments: Sensation intact bilateral lower extremities.  Good reflexes ankle.  Psychiatric:        Mood and Affect: Mood normal.        Behavior: Behavior normal.     ED Results / Procedures / Treatments   Labs (all labs ordered are listed, but only abnormal results are displayed) Labs Reviewed - No data to display  EKG None  Radiology DG Knee Complete 4 Views Right  Result Date: 11/28/2019 CLINICAL DATA:  Lateral right knee pain, no known injury EXAM: RIGHT KNEE - COMPLETE 4+ VIEW COMPARISON:  Radiograph 09/17/2019 FINDINGS: Diffuse soft tissue swelling of the knee. There is a moderate to large suprapatellar joint effusion with additional stranding in Hoffa's fat pad. Few punctate radiodensities are seen along the anterior joint line, nonspecific but could reflect joint bodies. Stable appearance of the slight irregularity along the anterior superior articular surface of the what appears to be the medial femoral condyle. There is apparent medial  compartmental narrowing on a background of mild to moderate tricompartmental degenerative change. Chondrocalcinosis of the medial and lateral menisci. Vascular calcium noted posterior to the knee. IMPRESSION: 1. Diffuse soft tissue swelling and moderate to large suprapatellar joint effusion. 2. Few punctate radiodensities along the anterior joint line, nonspecific but could reflect joint bodies, less likely fracture. 3. Stable appearance of the irregularity along the anterior superior articular surface of what appears to be the medial femoral condyle. May reflect a remote impaction injury given presence on comparison exam. 4. Stable mild to moderate tricompartmental degenerative changes. Chondrocalcinosis may reflect some underlying CPPD arthropathy. Electronically Signed   By: Lovena Le M.D.   On: 11/28/2019 16:54    Procedures .Joint Aspiration/Arthrocentesis  Date/Time: 11/28/2019 7:23 PM Performed by: Tedd Sias, PA Authorized by: Tedd Sias, PA   Consent:    Consent obtained:  Verbal   Consent given by:  Patient   Risks discussed:  Bleeding, infection, pain and incomplete drainage   Alternatives discussed:  No treatment, observation and referral Location:    Location:  Knee   Knee:  R knee Anesthesia (see MAR for exact dosages):    Anesthesia method:  Local infiltration   Local anesthetic:  Lidocaine 2% w/o epi Procedure details:    Preparation: Patient was prepped and draped in usual sterile fashion     Needle gauge:  18 G   Ultrasound guidance: yes     Approach:  Lateral   Aspirate amount:  25 cc   Aspirate characteristics:  Clear and yellow   Steroid injected: no     Specimen collected: no   Post-procedure details:    Dressing:  Adhesive bandage   Patient tolerance of procedure:  Tolerated well, no immediate complications Comments:     Patient was wrapped in a standing manner with a small bandage   (including critical care time)  Medications Ordered in  ED Medications  lidocaine (XYLOCAINE) 2 % (with pres) injection 400 mg (400 mg Infiltration Given by Other 11/28/19 1632)  colchicine tablet 1.2 mg (1.2 mg Oral Given 11/28/19 1753)    ED Course  I have reviewed the triage vital signs and the nursing notes.  Pertinent labs & imaging results that were available during my care of the patient were reviewed by me and considered in my medical decision making (see chart for details).    MDM Rules/Calculators/A&P                      Patient with history of CPPD, DM, COPD, other extensive past medical history detailed above.   Patient resenting with right knee pain and effusion for the past several months.  He has had similar issues in the past.  Recently had knee arthrocentesis in ED by EDP and had extensive lab work done to rule out septic arthritis.  Patient states his symptoms are similar to prior episode today.  His vital signs are within normal notes for some mild tachycardia which on my review of EMR appears to have been a chronic issue for him.  He states that this is secondary to pain.  Ultrasound-guided arthrocentesis conducted with Dr. Darl Householder.  Was successful obtained 25 cc of clear yellow synovial fluid.  Patient tolerated procedure well.  Discharged with orthopedic follow-up.  He was also discharged with colchicine he is given 1 dose 1.2 mg in ED and discharged with 0.6 mg which she will take daily for the next 2 days.  Patient will follow  up with orthopedic doctor.    Patient discharged with Ace wrap around knee.  He is ambulatory with some discomfort.  I discussed this case with my attending physician who cosigned this note including patient's presenting symptoms, physical exam, and planned diagnostics and interventions. Attending physician stated agreement with plan or made changes to plan which were implemented.   Attending physician assessed patient at bedside.  Patient was return precautions.  Final Clinical Impression(s) / ED  Diagnoses Final diagnoses:  Effusion of bursa of right knee  H/O calcium pyrophosphate deposition disease (CPPD)    Rx / DC Orders ED Discharge Orders         Ordered    colchicine 0.6 MG tablet  Daily     11/28/19 1740           Pati Gallo [place], [place] 11/28/19 1925    Drenda Freeze, MD 11/30/19 1452

## 2019-12-04 ENCOUNTER — Telehealth: Payer: Self-pay | Admitting: Pulmonary Disease

## 2019-12-04 NOTE — Telephone Encounter (Signed)
LMTCB for [facility]

## 2019-12-05 NOTE — Telephone Encounter (Signed)
Called [facility] and spoke with Deanna. Deanna wanted to check to see if we still had the order from 03/21/19 for pt to have OT. She stated a verbal was given but there was a form that was needing to be signed as well and since Aaron Edelman gave the verbal for this to be done, she was needing him to sign the sheet.  Stated to [facility] if this was done back in 03/2019, we do not have that form. She stated that she would refax it to our office so it could be signed. Verified that she had our correct fax number. Nothing further needed.

## 2019-12-05 NOTE — Telephone Encounter (Signed)
Please return call, [facility] called back

## 2019-12-06 DIAGNOSIS — J441 Chronic obstructive pulmonary disease with (acute) exacerbation: Secondary | ICD-10-CM | POA: Diagnosis not present

## 2019-12-06 DIAGNOSIS — J449 Chronic obstructive pulmonary disease, unspecified: Secondary | ICD-10-CM | POA: Diagnosis not present

## 2019-12-10 DIAGNOSIS — M25561 Pain in right knee: Secondary | ICD-10-CM | POA: Diagnosis not present

## 2019-12-10 DIAGNOSIS — M1180 Other specified crystal arthropathies, unspecified site: Secondary | ICD-10-CM | POA: Diagnosis not present

## 2019-12-18 DIAGNOSIS — J441 Chronic obstructive pulmonary disease with (acute) exacerbation: Secondary | ICD-10-CM | POA: Diagnosis not present

## 2019-12-19 ENCOUNTER — Other Ambulatory Visit: Payer: Self-pay | Admitting: *Deleted

## 2019-12-19 DIAGNOSIS — R0600 Dyspnea, unspecified: Secondary | ICD-10-CM

## 2019-12-19 DIAGNOSIS — J441 Chronic obstructive pulmonary disease with (acute) exacerbation: Secondary | ICD-10-CM

## 2019-12-19 MED ORDER — VENTOLIN HFA 108 (90 BASE) MCG/ACT IN AERS: 2.0000 | INHALATION_SPRAY | RESPIRATORY_TRACT | 0 refills | Status: DC | PRN

## 2019-12-28 ENCOUNTER — Encounter (HOSPITAL_BASED_OUTPATIENT_CLINIC_OR_DEPARTMENT_OTHER): Payer: Self-pay | Admitting: Emergency Medicine

## 2019-12-28 ENCOUNTER — Other Ambulatory Visit: Payer: Self-pay

## 2019-12-28 ENCOUNTER — Emergency Department (HOSPITAL_BASED_OUTPATIENT_CLINIC_OR_DEPARTMENT_OTHER)
Admission: EM | Admit: 2019-12-28 | Discharge: 2019-12-28 | Disposition: A | Discharge status: Home / Self Care | Payer: Medicare HMO | Attending: Emergency Medicine | Admitting: Emergency Medicine

## 2019-12-28 ENCOUNTER — Emergency Department (HOSPITAL_BASED_OUTPATIENT_CLINIC_OR_DEPARTMENT_OTHER): Payer: Medicare HMO

## 2019-12-28 DIAGNOSIS — M25551 Pain in right hip: Secondary | ICD-10-CM | POA: Insufficient documentation

## 2019-12-28 DIAGNOSIS — I10 Essential (primary) hypertension: Secondary | ICD-10-CM | POA: Insufficient documentation

## 2019-12-28 DIAGNOSIS — S76011A Strain of muscle, fascia and tendon of right hip, initial encounter: Secondary | ICD-10-CM | POA: Diagnosis not present

## 2019-12-28 DIAGNOSIS — Y9389 Activity, other specified: Secondary | ICD-10-CM | POA: Diagnosis not present

## 2019-12-28 DIAGNOSIS — E119 Type 2 diabetes mellitus without complications: Secondary | ICD-10-CM | POA: Diagnosis not present

## 2019-12-28 DIAGNOSIS — Z87891 Personal history of nicotine dependence: Secondary | ICD-10-CM | POA: Insufficient documentation

## 2019-12-28 DIAGNOSIS — Y999 Unspecified external cause status: Secondary | ICD-10-CM | POA: Diagnosis not present

## 2019-12-28 DIAGNOSIS — M11261 Other chondrocalcinosis, right knee: Secondary | ICD-10-CM | POA: Diagnosis not present

## 2019-12-28 DIAGNOSIS — Y9289 Other specified places as the place of occurrence of the external cause: Secondary | ICD-10-CM | POA: Insufficient documentation

## 2019-12-28 DIAGNOSIS — M1611 Unilateral primary osteoarthritis, right hip: Secondary | ICD-10-CM | POA: Diagnosis not present

## 2019-12-28 DIAGNOSIS — Z79899 Other long term (current) drug therapy: Secondary | ICD-10-CM | POA: Insufficient documentation

## 2019-12-28 DIAGNOSIS — X58XXXA Exposure to other specified factors, initial encounter: Secondary | ICD-10-CM | POA: Insufficient documentation

## 2019-12-28 DIAGNOSIS — M109 Gout, unspecified: Secondary | ICD-10-CM | POA: Diagnosis not present

## 2019-12-28 DIAGNOSIS — M25561 Pain in right knee: Secondary | ICD-10-CM | POA: Diagnosis present

## 2019-12-28 MED ORDER — HYDROCODONE-ACETAMINOPHEN 5-325 MG PO TABS: 1.0000 | ORAL_TABLET | ORAL | 0 refills | Status: DC | PRN

## 2019-12-28 MED ORDER — PREDNISONE 10 MG (21) PO TBPK
ORAL_TABLET | ORAL | 0 refills | Status: DC
Start: 2019-12-28 — End: 2020-01-29

## 2019-12-28 MED ORDER — PREDNISONE 50 MG PO TABS
60.0000 mg | ORAL_TABLET | Freq: Once | ORAL | Status: AC
  Administered 2019-12-28: 60 mg via ORAL
  Filled 2019-12-28: qty 1

## 2019-12-28 NOTE — ED Notes (Signed)
Pt discharged to home. Discharge instructions have been discussed with patient and/or family members. Pt verbally acknowledges understanding d/c instructions, and endorses comprehension to checkout at registration before leaving.  °

## 2019-12-28 NOTE — ED Triage Notes (Signed)
Ongoing R knee pain. He had a cortisone shot but the pain continues. Also reports R hip pain.

## 2019-12-28 NOTE — ED Provider Notes (Signed)
[facility] EMERGENCY DEPARTMENT Provider Note   CSN: [id number] Arrival date & time: 12/28/19  1516     History Chief Complaint  Patient presents with  . Knee Pain  . Hip Pain    Cory Kemp is a 79 y.o. male.  Pt presents to the ED today with right knee and right hip pain.  Pt has been having pain in the right knee for several months.  He was diagnosed with pseudogout.  He did see Dr. Stann Mainland at [facility] on 5/4 and had injections into his knee.  He said it felt better after the injection, but started hurting again for the past few days.  He also has right hip pain that has recently started.  Hip pain only when he lifts his leg.  He        Past Medical History:  Diagnosis Date  . Alcohol abuse   . Anemia   . Arthritis   . Asthma   . B12 deficiency 11/03/2013  . BPH (benign prostatic hypertrophy)   . COPD (chronic obstructive pulmonary disease) ([place])   . Dementia ([place])    ?  [name] Kitchen Diabetes mellitus ([place]) 01/10/2012  . Femoral hernia, right 03/27/2012  . Glucose intolerance (impaired glucose tolerance)   . Hyperlipidemia   . Hypertension   . Incisional hernias - swiss-cheese-type periumbilical [id number]  . Inguinal hernia - right 01/10/2012  . Leukopenia 01/29/2013  . Monocytosis 01/29/2013  . Normochromic anemia 01/29/2013  . Osteopenia     Patient Active Problem List   Diagnosis Date Noted  . Medication management 03/05/2019  . Acute exacerbation of chronic obstructive pulmonary disease (COPD) ([place]) 02/11/2019  . COPD with acute exacerbation ([place]) 02/08/2019  . Hypertensive urgency 02/08/2019  . CAP (community acquired pneumonia) 12/25/2018  . Acute on chronic respiratory failure with hypoxia ([place]) 12/25/2018  . Elevated d-dimer 12/25/2018  . Chronic respiratory failure with hypoxia ([place]) 12/13/2018  . Abnormal finding on EKG 01/03/2018  . OSA (obstructive sleep apnea) 06/14/2017  . Sinus tachycardia 04/11/2016  . Essential hypertension 04/11/2016  .  COPD (chronic obstructive pulmonary disease) ([place]) 01/11/2016  . B12 deficiency 11/03/2013  . Leukopenia 01/29/2013  . Monocytosis 01/29/2013  . Normochromic anemia 01/29/2013  . Former smoker 03/27/2012  . Diabetes mellitus ([place]) 01/10/2012    Past Surgical History:  Procedure Laterality Date  . HEMORRHOID SURGERY    . HERNIA REPAIR    . INGUINAL HERNIA REPAIR  02/24/2012   Procedure: LAPAROSCOPIC INGUINAL HERNIA;  Surgeon: Adin Hector, MD;  Location: [facility];  Service: General;  Laterality: N/A;  . TUMOR EXCISION     intestinal after SBO ( Benign), appendectomy  . VENTRAL HERNIA REPAIR  02/24/2012   Procedure: LAPAROSCOPIC VENTRAL HERNIA;  Surgeon: Adin Hector, MD;  Location: [facility];  Service: General;  Laterality: N/A;  lysis of adhesions, excision of abdominal wall lipomae       Family History  Problem Relation Age of Onset  . Lung cancer Father   . Lung cancer Mother   . Diabetes Brother     Social History   Tobacco Use  . Smoking status: Former Smoker    Packs/day: 1.00    Years: 63.00    Pack years: 63.00    Types: Cigarettes    Start date: 08/09/1955    Quit date: 08/08/2018    Years since quitting: 1.3  . Smokeless tobacco: Never Used  Substance Use Topics  . Alcohol use: No  Alcohol/week: 0.0 standard drinks  . Drug use: No    Home Medications Prior to Admission medications   Medication Sig Start Date End Date Taking? Authorizing Provider  albuterol (PROVENTIL) (2.5 MG/3ML) 0.083% nebulizer solution Take 3 mLs (2.5 mg total) by nebulization every 6 (six) hours as needed for wheezing or shortness of breath. 01/03/18   Lauraine Rinne, NP  Cholecalciferol (VITAMIN D-3) 25 MCG (1000 UT) CAPS Take 1,000 Units by mouth at bedtime.    [provider]  colchicine 0.6 MG tablet Take 1 tablet (0.6 mg total) by mouth daily for 3 doses. 11/28/19 12/01/19  Tedd Sias, PA  Cyanocobalamin (VITAMIN B-12) 5000 MCG TBDP Take 5,000 mcg by mouth at bedtime.      [provider]  cyclobenzaprine (FLEXERIL) 10 MG tablet Take 10 mg by mouth at bedtime as needed for muscle spasms.  08/19/19   [provider]  DALIRESP 250 MCG TABS Take 250 mg by mouth at bedtime. 08/27/19   [provider]  diclofenac Sodium (VOLTAREN) 1 % GEL Apply 4 g topically 4 (four) times daily. 09/17/19   Deno Etienne, DO  escitalopram (LEXAPRO) 10 MG tablet Take 10 mg by mouth at bedtime.  08/23/19   [provider]  fluticasone (FLONASE) 50 MCG/ACT nasal spray Place 2 sprays into both nostrils daily. Reduce to 1 spray each nostril daily after stopping Afrin Patient taking differently: Place 2 sprays into both nostrils daily.  03/20/19   Mannam, Hart Robinsons, MD  HYDROcodone-acetaminophen (NORCO/VICODIN) 5-325 MG tablet Take 1 tablet by mouth every 4 (four) hours as needed. 12/28/19   Isla Pence, MD  ibuprofen (ADVIL) 200 MG tablet Take 400 mg by mouth at bedtime.    [provider]  losartan (COZAAR) 50 MG tablet Take 50 mg by mouth at bedtime.  12/26/11   [provider]  OXYGEN Inhale 4 L into the lungs continuous.    [provider]  predniSONE (STERAPRED UNI-PAK 21 TAB) 10 MG (21) TBPK tablet Take 6 tabs for 2 days, then 5 for 2 days, then 4 for 2 days, then 3 for 2 days, 2 for 2 days, then 1 for 2 days 12/28/19   Isla Pence, MD  roflumilast (DALIRESP) 500 MCG TABS tablet Take 1 tablet (500 mcg total) by mouth daily. Patient not taking: Reported on 09/17/2019 05/15/19   [name], MD  simvastatin (ZOCOR) 40 MG tablet Take 40 mg by mouth at bedtime.  12/27/11   [provider]  TRELEGY ELLIPTA 100-62.5-25 MCG/INH AEPB INHALE 1 PUFF BY MOUTH EVERY DAY. Patient taking differently: Inhale 1 puff into the lungs daily.  05/15/19   Mannam, Hart Robinsons, MD  VENTOLIN HFA 108 (90 Base) MCG/ACT inhaler Inhale 2 puffs into the lungs every 4 (four) hours as needed for wheezing or shortness of breath. 12/19/19   [name], MD      Allergies    Adhesive [tape]  Review of Systems   Review of Systems  Musculoskeletal:       Right knee and hip pain  All other systems reviewed and are negative.   Physical Exam Updated Vital Signs BP (!) 145/110 (BP Location: Right Arm)   Pulse (!) 116   Temp 98.4 F (36.9 C) (Oral)   Resp (!) 22   Ht 6\' 1"  (1.854 m)   Wt 86.2 kg   BMI 25.07 kg/m   Physical Exam Vitals and nursing note reviewed.  Constitutional:      Appearance: Normal appearance.  HENT:     Head: Normocephalic and atraumatic.     Right Ear: External ear normal.     Left Ear: External ear normal.     Nose: Nose normal.     Mouth/Throat:     Mouth: Mucous membranes are moist.     Pharynx: Oropharynx is clear.  Eyes:     Extraocular Movements: Extraocular movements intact.     Conjunctiva/sclera: Conjunctivae normal.     Pupils: Pupils are equal, round, and reactive to light.  Cardiovascular:     Rate and Rhythm: Normal rate and regular rhythm.     Pulses: Normal pulses.     Heart sounds: Normal heart sounds.  Pulmonary:     Effort: Pulmonary effort is normal.     Breath sounds: Normal breath sounds.  Abdominal:     General: Abdomen is flat. Bowel sounds are normal.     Palpations: Abdomen is soft.  Musculoskeletal:     Cervical back: Normal range of motion and neck supple.       Legs:     Comments: Right knee slightly swollen.  No redness.  Doubt septic joint.  Right hip:  Hip extensor tendon tenderness with movement.  Joint good rom.  Skin:    General: Skin is warm.     Capillary Refill: Capillary refill takes less than 2 seconds.  Neurological:     General: No focal deficit present.     Mental Status: He is alert and oriented to person, place, and time.  Psychiatric:        Mood and Affect: Mood normal.        Behavior: Behavior normal.        Thought Content: Thought content normal.        Judgment: Judgment normal.     ED Results / Procedures / Treatments   Labs (all  labs ordered are listed, but only abnormal results are displayed) Labs Reviewed - No data to display  EKG None  Radiology DG Hip Unilat W or Wo Pelvis 2-3 Views Right  Result Date: 12/28/2019 CLINICAL DATA:  Right hip pain EXAM: DG HIP (WITH OR WITHOUT PELVIS) 3V RIGHT COMPARISON:  None. FINDINGS: Pelvic ring is intact. Mild degenerative changes of the hip joints are noted bilaterally. No acute fracture or dislocation is seen. No soft tissue abnormality is noted. IMPRESSION: Degenerative change without acute abnormality. Electronically Signed   By: Inez Catalina M.D.   On: 12/28/2019 16:56    Procedures Procedures (including critical care time)  Medications Ordered in ED Medications  predniSONE (DELTASONE) tablet 60 mg (has no administration in time range)    ED Course  I have reviewed the triage vital signs and the nursing notes.  Pertinent labs & imaging results that were available during my care of the patient were reviewed by me and considered in my medical decision making (see chart for details).    MDM Rules/Calculators/A&P                      Pt will be d/c with prednisone and lortab.  Return if worse.  F/u with Dr. Stann Mainland.  Final Clinical Impression(s) / ED Diagnoses Final diagnoses:  Pseudogout of knee, right  Strain of right hip, initial encounter    Rx / DC Orders ED Discharge Orders         Ordered    HYDROcodone-acetaminophen (NORCO/VICODIN) 5-325 MG tablet  Every 4 hours PRN     12/28/19 1711  predniSONE (STERAPRED UNI-PAK 21 TAB) 10 MG (21) TBPK tablet     12/28/19 1712           Isla Pence, MD 12/28/19 1714

## 2019-12-28 NOTE — ED Notes (Signed)
ED Provider at bedside. 

## 2020-01-06 DIAGNOSIS — J449 Chronic obstructive pulmonary disease, unspecified: Secondary | ICD-10-CM | POA: Diagnosis not present

## 2020-01-06 DIAGNOSIS — J441 Chronic obstructive pulmonary disease with (acute) exacerbation: Secondary | ICD-10-CM | POA: Diagnosis not present

## 2020-01-13 DIAGNOSIS — R69 Illness, unspecified: Secondary | ICD-10-CM | POA: Diagnosis not present

## 2020-01-16 ENCOUNTER — Other Ambulatory Visit: Payer: Self-pay | Admitting: *Deleted

## 2020-01-16 NOTE — Patient Outreach (Signed)
[facility]) Care Management  [place]  01/16/2020   Cory Kemp 1941-04-27 [id number]  RN Health Coach telephone call to patient.  [employer] compliance verified. Per patient he is having some increased shortness of breath with activity. He has to stop and rest for short period of time. Patient is using his oxygen 2-4 liters per nasal cannula.  Patient is [place]. He has a friend that helps him. Patient stated that his knee is giving him a lot of pain. Per patient he is using his inhaler as prescribed. Patient has not had any ER visit for COPD exacerbation. Patient has agreed to follow up outreach calls.    Encounter Medications:  Outpatient Encounter Medications as of 01/16/2020  Medication Sig  . albuterol (PROVENTIL) (2.5 MG/3ML) 0.083% nebulizer solution Take 3 mLs (2.5 mg total) by nebulization every 6 (six) hours as needed for wheezing or shortness of breath.  . Cholecalciferol (VITAMIN D-3) 25 MCG (1000 UT) CAPS Take 1,000 Units by mouth at bedtime.  . colchicine 0.6 MG tablet Take 1 tablet (0.6 mg total) by mouth daily for 3 doses.  . Cyanocobalamin (VITAMIN B-12) 5000 MCG TBDP Take 5,000 mcg by mouth at bedtime.   . cyclobenzaprine (FLEXERIL) 10 MG tablet Take 10 mg by mouth at bedtime as needed for muscle spasms.   [name] Kitchen DALIRESP 250 MCG TABS Take 250 mg by mouth at bedtime.  . diclofenac Sodium (VOLTAREN) 1 % GEL Apply 4 g topically 4 (four) times daily.  [name] Kitchen escitalopram (LEXAPRO) 10 MG tablet Take 10 mg by mouth at bedtime.   . fluticasone (FLONASE) 50 MCG/ACT nasal spray Place 2 sprays into both nostrils daily. Reduce to 1 spray each nostril daily after stopping Afrin (Patient taking differently: Place 2 sprays into both nostrils daily. )  . HYDROcodone-acetaminophen (NORCO/VICODIN) 5-325 MG tablet Take 1 tablet by mouth every 4 (four) hours as needed.  [name] Kitchen ibuprofen (ADVIL) 200 MG tablet Take 400 mg by mouth at  bedtime.  [name] Kitchen losartan (COZAAR) 50 MG tablet Take 50 mg by mouth at bedtime.   . OXYGEN Inhale 4 L into the lungs continuous.  . predniSONE (STERAPRED UNI-PAK 21 TAB) 10 MG (21) TBPK tablet Take 6 tabs for 2 days, then 5 for 2 days, then 4 for 2 days, then 3 for 2 days, 2 for 2 days, then 1 for 2 days  . roflumilast (DALIRESP) 500 MCG TABS tablet Take 1 tablet (500 mcg total) by mouth daily. (Patient not taking: Reported on 09/17/2019)  . simvastatin (ZOCOR) 40 MG tablet Take 40 mg by mouth at bedtime.   . TRELEGY ELLIPTA 100-62.5-25 MCG/INH AEPB INHALE 1 PUFF BY MOUTH EVERY DAY. (Patient taking differently: Inhale 1 puff into the lungs daily. )  . VENTOLIN HFA 108 (90 Base) MCG/ACT inhaler Inhale 2 puffs into the lungs every 4 (four) hours as needed for wheezing or shortness of breath.   No facility-administered encounter medications on file as of 01/16/2020.    Functional Status:  In your present state of health, do you have any difficulty performing the following activities: 06/20/2019 02/20/2019  Hearing? Y Y  Comment hearing aid Hearing aid  Vision? N N  Difficulty concentrating or making decisions? N N  Walking or climbing stairs? Y Y  Comment shortness of breath due to trouble breathing  Dressing or bathing? Y Y  Comment due to the shortness of breath with exertion due to trouble breathing  Doing errands, shopping? N N  Preparing Food and eating ? N N  Using the Toilet? N N  In the past six months, have you accidently leaked urine? Y Y  Do you have problems with loss of bowel control? N N  Managing your Medications? Y Y  Managing your Finances? N N  Housekeeping or managing your Housekeeping? Y Y  Some recent data might be hidden    Fall/Depression Screening: Fall Risk  10/17/2019 06/20/2019 02/19/2019  Falls in the past year? 0 0 0  Number falls in past yr: 0 0 -  Injury with Fall? 0 0 0  Risk for fall due to : Impaired mobility Impaired balance/gait;Impaired mobility -  Risk  for fall due to: Comment at times knee swells - -  Follow up Falls evaluation completed;Falls prevention discussed Falls evaluation completed -   PHQ 2/9 Scores 10/17/2019 06/20/2019 02/19/2019  PHQ - 2 Score 0 1 1   THN CM Care Plan Problem One     Most Recent Value  Care Plan Problem One Knowledge Deficit in Self management of COPD  Role Documenting the Problem One [place] for Problem One Active  THN Long Term Goal  Patient will not have a COPD exacerbation within the next 90 days  Interventions for Problem One Long Term Goal Patient having increase shortness of breath with humidity. RN reiterated the what patient action plan woiuld be. RN will follow up with further discussion  THN CM Short Term Goal #3 Patient will verbalize understanding health maintenance witin the next 30 days  Interventions for Short Tern Goal #3 RN reiterates health maintenance. RN discusses keeping up with vaccines and eye exam. RN will follow up with reminding patient to follow thru.      Assessment:  Shortness of breath on exertion Humidity has made breathing worse Patient is having moderate knee pain Patient is using oxygen 2-4 liter per nasal cannula Patient is aware of COPD zones and action plan Patient has received COVID vaccines Plan:  Patient will contact orthopedic for knee pain RN reiterated COPD zones and action plan RN reiterates health maintenance RN reiterates medication adherence RN will follow up within the month of September RN sent update assessment to PCP  [place] Management [phone]

## 2020-01-17 ENCOUNTER — Telehealth: Payer: Self-pay | Admitting: *Deleted

## 2020-01-17 ENCOUNTER — Other Ambulatory Visit: Payer: Self-pay | Admitting: *Deleted

## 2020-01-17 NOTE — Telephone Encounter (Signed)
Patient needing a refill on his Ventolin HFA inhaler.  Advised him that he would have to have an ov prior to further refills.  Attempted to schedule him on a Tuesday am next week with a NP, however, nothing was convenient.  Patient stated he would call his daughter and then one of them would call back to schedule his ov.

## 2020-01-18 DIAGNOSIS — J441 Chronic obstructive pulmonary disease with (acute) exacerbation: Secondary | ICD-10-CM | POA: Diagnosis not present

## 2020-01-21 ENCOUNTER — Other Ambulatory Visit: Payer: Self-pay

## 2020-01-21 ENCOUNTER — Telehealth: Payer: Self-pay | Admitting: Primary Care

## 2020-01-21 DIAGNOSIS — J441 Chronic obstructive pulmonary disease with (acute) exacerbation: Secondary | ICD-10-CM

## 2020-01-21 DIAGNOSIS — R0600 Dyspnea, unspecified: Secondary | ICD-10-CM

## 2020-01-21 MED ORDER — TRELEGY ELLIPTA 100-62.5-25 MCG/INH IN AEPB: INHALATION_SPRAY | RESPIRATORY_TRACT | 0 refills | Status: DC

## 2020-01-21 MED ORDER — VENTOLIN HFA 108 (90 BASE) MCG/ACT IN AERS: 2.0000 | INHALATION_SPRAY | RESPIRATORY_TRACT | 0 refills | Status: DC | PRN

## 2020-01-21 NOTE — Telephone Encounter (Signed)
Spoke with pt's son, Theoplis. Pt needs a refill on Trelegy and Ventolin. Rxs have been sent in. Pt has a pending OV next week with [name].

## 2020-01-21 NOTE — Telephone Encounter (Signed)
ATC pt, he answered the line and asked if he could call right back. Will await his return call.

## 2020-01-21 NOTE — Telephone Encounter (Signed)
Pt's son returning call.  [phone].

## 2020-01-29 ENCOUNTER — Other Ambulatory Visit: Payer: Self-pay

## 2020-01-29 ENCOUNTER — Encounter: Payer: Self-pay | Admitting: Primary Care

## 2020-01-29 ENCOUNTER — Ambulatory Visit: Payer: Medicare HMO | Admitting: Primary Care

## 2020-01-29 DIAGNOSIS — J441 Chronic obstructive pulmonary disease with (acute) exacerbation: Secondary | ICD-10-CM

## 2020-01-29 NOTE — Progress Notes (Signed)
@Patient  ID: Cory Kemp, male    DOB: 06/25/1941, 79 y.o.   MRN: [id number]  Chief Complaint  Patient presents with  . Follow-up    SOB is better, occ cough some mucous    Referring provider: Jani Gravel, MD  HPI: 79 year old male. PMH significant for COPD, chronic respiratory failure with hypoxia, community-acquired pneumonia, obstructive sleep apnea, diabetes, pretension.  Patient of Dr. Vaughan Browner, last seen 05/15/2019. Maintained on Trelegy and Daliresp.   01/29/2020 Patient presents today for regular follow-up visit/medicatoin refill.He states that his breathing is improved.  He does have a chronic productive cough which is worse in the morning.  His sputum color is clear.  He uses his albuterol rescue inhaler twice daily.  He called our office June 15 requesting refills of his Trelegy and Ventolin, prescriptions sent into pharmacy.   Allergies  Allergen Reactions  . Adhesive [Tape] Rash    No "PLASTIC" tape!!! Patient broke out in blisters!!    Immunization History  Administered Date(s) Administered  . Influenza, High Dose Seasonal PF 05/21/2018  . PFIZER SARS-COV-2 Vaccination 08/30/2019, 09/20/2019  . Pneumococcal Conjugate-13 02/02/2018  . Pneumococcal Polysaccharide-23 03/05/2019    Past Medical History:  Diagnosis Date  . Alcohol abuse   . Anemia   . Arthritis   . Asthma   . B12 deficiency 11/03/2013  . BPH (benign prostatic hypertrophy)   . COPD (chronic obstructive pulmonary disease) ([place])   . Dementia ([place])    ?  [name] Kitchen Diabetes mellitus ([place]) 01/10/2012  . Femoral hernia, right 03/27/2012  . Glucose intolerance (impaired glucose tolerance)   . Hyperlipidemia   . Hypertension   . Incisional hernias - swiss-cheese-type periumbilical 04/15/9210  . Inguinal hernia - right 01/10/2012  . Leukopenia 01/29/2013  . Monocytosis 01/29/2013  . Normochromic anemia 01/29/2013  . Osteopenia     Tobacco History: Social History   Tobacco Use  Smoking Status Former Smoker  .  Packs/day: 1.00  . Years: 63.00  . Pack years: 63.00  . Types: Cigarettes  . Start date: 08/09/1955  . Quit date: 08/08/2018  . Years since quitting: 1.5  Smokeless Tobacco Never Used   Counseling given: Not Answered   Outpatient Medications Prior to Visit  Medication Sig Dispense Refill  . albuterol (PROVENTIL) (2.5 MG/3ML) 0.083% nebulizer solution Take 3 mLs (2.5 mg total) by nebulization every 6 (six) hours as needed for wheezing or shortness of breath. 75 mL 3  . Cholecalciferol (VITAMIN D-3) 25 MCG (1000 UT) CAPS Take 1,000 Units by mouth at bedtime.    . Cyanocobalamin (VITAMIN B-12) 5000 MCG TBDP Take 5,000 mcg by mouth at bedtime.     . cyclobenzaprine (FLEXERIL) 10 MG tablet Take 10 mg by mouth at bedtime as needed for muscle spasms.     [name] Kitchen DALIRESP 250 MCG TABS Take 250 mg by mouth at bedtime.    . diclofenac Sodium (VOLTAREN) 1 % GEL Apply 4 g topically 4 (four) times daily. 100 g 0  . escitalopram (LEXAPRO) 10 MG tablet Take 10 mg by mouth at bedtime.     . fluticasone (FLONASE) 50 MCG/ACT nasal spray Place 2 sprays into both nostrils daily. Reduce to 1 spray each nostril daily after stopping Afrin (Patient taking differently: Place 2 sprays into both nostrils daily. ) 16 g 2  . Fluticasone-Umeclidin-Vilant (TRELEGY ELLIPTA) 100-62.5-25 MCG/INH AEPB INHALE 1 PUFF BY MOUTH EVERY DAY. 60 each 0  . HYDROcodone-acetaminophen (NORCO/VICODIN) 5-325 MG tablet Take 1 tablet by mouth every 4 (  four) hours as needed. 10 tablet 0  . ibuprofen (ADVIL) 200 MG tablet Take 400 mg by mouth at bedtime.    [name] Kitchen losartan (COZAAR) 50 MG tablet Take 50 mg by mouth at bedtime.     . OXYGEN Inhale 4 L into the lungs continuous.    . roflumilast (DALIRESP) 500 MCG TABS tablet Take 1 tablet (500 mcg total) by mouth daily. 30 tablet 11  . simvastatin (ZOCOR) 40 MG tablet Take 40 mg by mouth at bedtime.     . VENTOLIN HFA 108 (90 Base) MCG/ACT inhaler Inhale 2 puffs into the lungs every 4 (four) hours as  needed for wheezing or shortness of breath. 18 g 0  . colchicine 0.6 MG tablet Take 1 tablet (0.6 mg total) by mouth daily for 3 doses. 3 tablet 0  . predniSONE (STERAPRED UNI-PAK 21 TAB) 10 MG (21) TBPK tablet Take 6 tabs for 2 days, then 5 for 2 days, then 4 for 2 days, then 3 for 2 days, 2 for 2 days, then 1 for 2 days 42 tablet 0   No facility-administered medications prior to visit.    Review of Systems  Review of Systems  Constitutional: Negative.   Respiratory: Positive for cough and shortness of breath.    Physical Exam  BP 140/80 (BP Location: Left Arm, Cuff Size: Normal)   Pulse 99   Temp (!) 97.3 F (36.3 C) (Oral)   Ht 6\' 1"  (1.854 m)   Wt 178 lb 9.6 oz (81 kg)   SpO2 92%   BMI 23.56 kg/m  Physical Exam Constitutional:      Appearance: Normal appearance.  HENT:     Head: Normocephalic and atraumatic.     Mouth/Throat:     Mouth: Mucous membranes are moist.     Pharynx: Oropharynx is clear.  Cardiovascular:     Rate and Rhythm: Normal rate and regular rhythm.  Pulmonary:     Effort: Pulmonary effort is normal. No respiratory distress.     Breath sounds: Normal breath sounds. No wheezing or rhonchi.     Comments: LSC, diminished; on 2L POC Musculoskeletal:     Comments: In WC; right knee gout  Skin:    General: Skin is warm and dry.  Neurological:     General: No focal deficit present.     Mental Status: He is alert and oriented to person, place, and time. Mental status is at baseline.  Psychiatric:        Mood and Affect: Mood normal.        Behavior: Behavior normal.        Thought Content: Thought content normal.        Judgment: Judgment normal.      Lab Results:  CBC    Component Value Date/Time   WBC 3.9 (L) 09/17/2019 0031   RBC 3.93 (L) 09/17/2019 0031   HGB 10.9 (L) 09/17/2019 0031   HGB 13.3 06/25/2013 0822   HCT 35.2 (L) 09/17/2019 0031   HCT 39.8 06/25/2013 0822   PLT 182 09/17/2019 0031   PLT 165 06/25/2013 0822   MCV 89.6  09/17/2019 0031   MCV 89.4 06/25/2013 0822   MCH 27.7 09/17/2019 0031   MCHC 31.0 09/17/2019 0031   RDW 15.4 09/17/2019 0031   RDW 14.3 06/25/2013 0822   LYMPHSABS 1.0 09/17/2019 0031   LYMPHSABS 1.3 06/25/2013 0822   MONOABS 0.9 09/17/2019 0031   MONOABS 0.6 06/25/2013 0822   EOSABS 0.0 09/17/2019 0031  EOSABS 0.0 06/25/2013 0822   BASOSABS 0.0 09/17/2019 0031   BASOSABS 0.0 06/25/2013 0822    BMET    Component Value Date/Time   NA 136 09/17/2019 0031   NA 140 02/20/2013 1148   K 4.0 09/17/2019 0031   K 4.1 02/20/2013 1148   CL 100 09/17/2019 0031   CO2 26 09/17/2019 0031   CO2 26 02/20/2013 1148   GLUCOSE 179 (H) 09/17/2019 0031   GLUCOSE 89 02/20/2013 1148   BUN 23 09/17/2019 0031   BUN 12.8 02/20/2013 1148   CREATININE 0.97 09/17/2019 0031   CREATININE 0.8 02/20/2013 1148   CALCIUM 9.2 09/17/2019 0031   CALCIUM 9.2 02/20/2013 1148   GFRNONAA >60 09/17/2019 0031   GFRAA >60 09/17/2019 0031    BNP    Component Value Date/Time   BNP 81.2 04/10/2019 0805    ProBNP No results found for: PROBNP  Imaging: No results found.   Assessment & Plan:   COPD (chronic obstructive pulmonary disease) ([place]) - His breathing has improved, he has achronic congested cough with clear mucus - Continue Trelegy 1 puff daily in the morning (rinse mouth after use); Ventolin inhaler or Albuterol nebulizer every 4-6 hours for breakthrough shortness of breath or wheezing - Continue Daliresp 500 mcg once daily - Take Mucinex 1 to 2 tablets twice daily for congestion (over-the-counter) - Add [place] nasal spray twice daily (over-the-counter) - Advised patient to monitor for purulent mucus or increased shortness of breath or wheezing  RX: Refills of Trelegy and ProAir rescue inhaler sent   Follow-up: 6 months with Dr. Vaughan Browner or sooner if needed      Martyn Ehrich, NP 02/10/2020

## 2020-01-29 NOTE — Patient Instructions (Signed)
Recommendations: Continue Trelegy 1 puff daily in the morning (rinse mouth after use) You can use your Ventolin inhaler or Albuterol nebulizer every 4-6 hours for breakthrough shortness of breath or wheezing Continue Daliresp 500 mcg once daily Take Mucinex 1 to 2 tablets twice daily for congestion (over-the-counter) Add Ocean nasal spray twice daily (over-the-counter) If you develop purulent mucus or increased shortness of breath or wheezing please contact office sooner than your next scheduled appointment  RX: Refills of Trelegy and ProAir rescue inhaler have already been sent to your pharmacy  Follow-up: 6 months with Dr. Vaughan Browner or sooner if needed   Chronic Obstructive Pulmonary Disease Chronic obstructive pulmonary disease (COPD) is a long-term (chronic) lung problem. When you have COPD, it is hard for air to get in and out of your lungs. Usually the condition gets worse over time, and your lungs will never return to normal. There are things you can do to keep yourself as healthy as possible.  Your doctor may treat your condition with: ? Medicines. ? Oxygen. ? Lung surgery.  Your doctor may also recommend: ? Rehabilitation. This includes steps to make your body work better. It may involve a team of specialists. ? Quitting smoking, if you smoke. ? Exercise and changes to your diet. ? Comfort measures (palliative care). Follow these instructions at home: Medicines  Take over-the-counter and prescription medicines only as told by your doctor.  Talk to your doctor before taking any cough or allergy medicines. You may need to avoid medicines that cause your lungs to be dry. Lifestyle  If you smoke, stop. Smoking makes the problem worse. If you need help quitting, ask your doctor.  Avoid being around things that make your breathing worse. This may include smoke, chemicals, and fumes.  Stay active, but remember to rest as well.  Learn and use tips on how to relax.  Make sure  you get enough sleep. Most adults need at least 7 hours of sleep every night.  Eat healthy foods. Eat smaller meals more often. Rest before meals. Controlled breathing Learn and use tips on how to control your breathing as told by your doctor. Try:  Breathing in (inhaling) through your nose for 1 second. Then, pucker your lips and breath out (exhale) through your lips for 2 seconds.  Putting one hand on your belly (abdomen). Breathe in slowly through your nose for 1 second. Your hand on your belly should move out. Pucker your lips and breathe out slowly through your lips. Your hand on your belly should move in as you breathe out.  Controlled coughing Learn and use controlled coughing to clear mucus from your lungs. Follow these steps: 1. Lean your head a little forward. 2. Breathe in deeply. 3. Try to hold your breath for 3 seconds. 4. Keep your mouth slightly open while coughing 2 times. 5. Spit any mucus out into a tissue. 6. Rest and do the steps again 1 or 2 times as needed. General instructions  Make sure you get all the shots (vaccines) that your doctor recommends. Ask your doctor about a flu shot and a pneumonia shot.  Use oxygen therapy and pulmonary rehabilitation if told by your doctor. If you need home oxygen therapy, ask your doctor if you should buy a tool to measure your oxygen level (oximeter).  Make a COPD action plan with your doctor. This helps you to know what to do if you feel worse than usual.  Manage any other conditions you have as told by  your doctor.  Avoid going outside when it is very hot, cold, or humid.  Avoid people who have a sickness you can catch (contagious).  Keep all follow-up visits as told by your doctor. This is important. Contact a doctor if:  You cough up more mucus than usual.  There is a change in the color or thickness of the mucus.  It is harder to breathe than usual.  Your breathing is faster than usual.  You have trouble  sleeping.  You need to use your medicines more often than usual.  You have trouble doing your normal activities such as getting dressed or walking around the house. Get help right away if:  You have shortness of breath while resting.  You have shortness of breath that stops you from: ? Being able to talk. ? Doing normal activities.  Your chest hurts for longer than 5 minutes.  Your skin color is more blue than usual.  Your pulse oximeter shows that you have low oxygen for longer than 5 minutes.  You have a fever.  You feel too tired to breathe normally. Summary  Chronic obstructive pulmonary disease (COPD) is a long-term lung problem.  The way your lungs work will never return to normal. Usually the condition gets worse over time. There are things you can do to keep yourself as healthy as possible.  Take over-the-counter and prescription medicines only as told by your doctor.  If you smoke, stop. Smoking makes the problem worse. This information is not intended to replace advice given to you by your health care provider. Make sure you discuss any questions you have with your health care provider. Document Revised: 07/07/2017 Document Reviewed: 08/29/2016 Elsevier Patient Education  2020 [employer].

## 2020-02-03 ENCOUNTER — Ambulatory Visit: Payer: Medicare HMO | Admitting: Primary Care

## 2020-02-05 DIAGNOSIS — J441 Chronic obstructive pulmonary disease with (acute) exacerbation: Secondary | ICD-10-CM | POA: Diagnosis not present

## 2020-02-05 DIAGNOSIS — J449 Chronic obstructive pulmonary disease, unspecified: Secondary | ICD-10-CM | POA: Diagnosis not present

## 2020-02-07 DIAGNOSIS — R7303 Prediabetes: Secondary | ICD-10-CM | POA: Diagnosis not present

## 2020-02-07 DIAGNOSIS — C61 Malignant neoplasm of prostate: Secondary | ICD-10-CM | POA: Diagnosis not present

## 2020-02-07 DIAGNOSIS — R972 Elevated prostate specific antigen [PSA]: Secondary | ICD-10-CM | POA: Diagnosis not present

## 2020-02-07 DIAGNOSIS — I10 Essential (primary) hypertension: Secondary | ICD-10-CM | POA: Diagnosis not present

## 2020-02-07 DIAGNOSIS — E7800 Pure hypercholesterolemia, unspecified: Secondary | ICD-10-CM | POA: Diagnosis not present

## 2020-02-07 DIAGNOSIS — J449 Chronic obstructive pulmonary disease, unspecified: Secondary | ICD-10-CM | POA: Diagnosis not present

## 2020-02-10 NOTE — Assessment & Plan Note (Addendum)
-   His breathing has improved, he has achronic congested cough with clear mucus - Continue Trelegy 1 puff daily in the morning (rinse mouth after use); Ventolin inhaler or Albuterol nebulizer every 4-6 hours for breakthrough shortness of breath or wheezing - Continue Daliresp 500 mcg once daily - Take Mucinex 1 to 2 tablets twice daily for congestion (over-the-counter) - Add Ocean nasal spray twice daily (over-the-counter) - Advised patient to monitor for purulent mucus or increased shortness of breath or wheezing  RX: Refills of Trelegy and ProAir rescue inhaler sent   Follow-up: 6 months with Dr. Vaughan Browner or sooner if needed

## 2020-02-13 DIAGNOSIS — E7800 Pure hypercholesterolemia, unspecified: Secondary | ICD-10-CM | POA: Diagnosis not present

## 2020-02-13 DIAGNOSIS — R7303 Prediabetes: Secondary | ICD-10-CM | POA: Diagnosis not present

## 2020-02-13 DIAGNOSIS — R69 Illness, unspecified: Secondary | ICD-10-CM | POA: Diagnosis not present

## 2020-02-13 DIAGNOSIS — M542 Cervicalgia: Secondary | ICD-10-CM | POA: Diagnosis not present

## 2020-02-13 DIAGNOSIS — Z720 Tobacco use: Secondary | ICD-10-CM | POA: Diagnosis not present

## 2020-02-13 DIAGNOSIS — C61 Malignant neoplasm of prostate: Secondary | ICD-10-CM | POA: Diagnosis not present

## 2020-02-13 DIAGNOSIS — I10 Essential (primary) hypertension: Secondary | ICD-10-CM | POA: Diagnosis not present

## 2020-02-13 DIAGNOSIS — J449 Chronic obstructive pulmonary disease, unspecified: Secondary | ICD-10-CM | POA: Diagnosis not present

## 2020-02-13 DIAGNOSIS — M549 Dorsalgia, unspecified: Secondary | ICD-10-CM | POA: Diagnosis not present

## 2020-02-13 DIAGNOSIS — R0600 Dyspnea, unspecified: Secondary | ICD-10-CM | POA: Diagnosis not present

## 2020-02-17 ENCOUNTER — Other Ambulatory Visit: Payer: Self-pay | Admitting: Primary Care

## 2020-02-17 DIAGNOSIS — J441 Chronic obstructive pulmonary disease with (acute) exacerbation: Secondary | ICD-10-CM | POA: Diagnosis not present

## 2020-03-07 DIAGNOSIS — J449 Chronic obstructive pulmonary disease, unspecified: Secondary | ICD-10-CM | POA: Diagnosis not present

## 2020-03-07 DIAGNOSIS — J441 Chronic obstructive pulmonary disease with (acute) exacerbation: Secondary | ICD-10-CM | POA: Diagnosis not present

## 2020-04-07 DIAGNOSIS — J449 Chronic obstructive pulmonary disease, unspecified: Secondary | ICD-10-CM | POA: Diagnosis not present

## 2020-04-11 DIAGNOSIS — R69 Illness, unspecified: Secondary | ICD-10-CM | POA: Diagnosis not present

## 2020-04-14 ENCOUNTER — Other Ambulatory Visit: Payer: Self-pay | Admitting: *Deleted

## 2020-04-14 NOTE — Patient Outreach (Signed)
[facility]) Care Management  [place]  04/14/2020   KENDRA WOOLFORD Dec 26, 1940 [id number]  RN Health Coach telephone call to patient.  [employer] compliance verified. Per patient he has a cough with clear sputum. Patient stated that he uses his nasal spray and blows nose in the am. Per patient he has shortness of breath on exertion. Patient stated that he is able to control his breathing better. Patient stated that he breathes in through nose and blow out thru mouth. He stated he uses a pulse ox to measure his oxygen saturation. Per patient he has not been exercising. Patient stated that he gets tired easily. Patient has received COVID vaccine.Patient has agreed to follow up outreach calls.   Encounter Medications:  Outpatient Encounter Medications as of 04/14/2020  Medication Sig  . albuterol (PROVENTIL) (2.5 MG/3ML) 0.083% nebulizer solution Take 3 mLs (2.5 mg total) by nebulization every 6 (six) hours as needed for wheezing or shortness of breath.  . Cholecalciferol (VITAMIN D-3) 25 MCG (1000 UT) CAPS Take 1,000 Units by mouth at bedtime.  . colchicine 0.6 MG tablet Take 1 tablet (0.6 mg total) by mouth daily for 3 doses.  . Cyanocobalamin (VITAMIN B-12) 5000 MCG TBDP Take 5,000 mcg by mouth at bedtime.   . cyclobenzaprine (FLEXERIL) 10 MG tablet Take 10 mg by mouth at bedtime as needed for muscle spasms.   [name] Kitchen DALIRESP 250 MCG TABS Take 250 mg by mouth at bedtime.  . diclofenac Sodium (VOLTAREN) 1 % GEL Apply 4 g topically 4 (four) times daily.  [name] Kitchen escitalopram (LEXAPRO) 10 MG tablet Take 10 mg by mouth at bedtime.   . fluticasone (FLONASE) 50 MCG/ACT nasal spray Place 2 sprays into both nostrils daily. Reduce to 1 spray each nostril daily after stopping Afrin (Patient taking differently: Place 2 sprays into both nostrils daily. )  . HYDROcodone-acetaminophen (NORCO/VICODIN) 5-325 MG tablet Take 1 tablet by mouth every 4 (four) hours as needed.  [name] Kitchen ibuprofen (ADVIL) 200 MG  tablet Take 400 mg by mouth at bedtime.  [name] Kitchen losartan (COZAAR) 50 MG tablet Take 50 mg by mouth at bedtime.   . OXYGEN Inhale 4 L into the lungs continuous.  . roflumilast (DALIRESP) 500 MCG TABS tablet Take 1 tablet (500 mcg total) by mouth daily.  . simvastatin (ZOCOR) 40 MG tablet Take 40 mg by mouth at bedtime.   . TRELEGY ELLIPTA 100-62.5-25 MCG/INH AEPB INHALE 1 PUFF BY MOUTH EVERY DAY  . VENTOLIN HFA 108 (90 Base) MCG/ACT inhaler Inhale 2 puffs into the lungs every 4 (four) hours as needed for wheezing or shortness of breath.   No facility-administered encounter medications on file as of 04/14/2020.    Functional Status:  In your present state of health, do you have any difficulty performing the following activities: 06/20/2019  Hearing? Y  Comment hearing aid  Vision? N  Difficulty concentrating or making decisions? N  Walking or climbing stairs? Y  Comment shortness of breath  Dressing or bathing? Y  Comment due to the shortness of breath with exertion  Doing errands, shopping? N  Preparing Food and eating ? N  Using the Toilet? N  In the past six months, have you accidently leaked urine? Y  Do you have problems with loss of bowel control? N  Managing your Medications? Y  Managing your Finances? N  Housekeeping or managing your Housekeeping? Y  Some recent data might be hidden    Fall/Depression Screening: Fall Risk  04/14/2020 10/17/2019  06/20/2019  Falls in the past year? 0 0 0  Number falls in past yr: 0 0 0  Injury with Fall? 0 0 0  Risk for fall due to : Impaired mobility Impaired mobility Impaired balance/gait;Impaired mobility  Risk for fall due to: Comment - at times knee swells -  Follow up Falls evaluation completed Falls evaluation completed;Falls prevention discussed Falls evaluation completed   PHQ 2/9 Scores 10/17/2019 06/20/2019 02/19/2019  PHQ - 2 Score 0 1 1   Goals Addressed            This Visit's Progress   . Client will verbalize knowledge of  chronic lung disease as evidenced by no ED visits or Inpatient stays related to chronic lung disease        CARE PLAN ENTRY (see longtitudinal plan of care for additional care plan information)  Current Barriers:  [name] Kitchen Knowledge deficits related to basic understanding of COPD disease process . Knowledge deficits related to basic COPD self care/management . Knowledge deficit related to importance of energy conservation   Case Manager Clinical Goal(s):  Over the next 90 days, patient will be able to verbalize understanding of COPD action plan and when to seek appropriate levels of medical care  Over the next 90 days, patient will engage in lite exercise as tolerated to build/regain stamina and strength and reduce shortness of breath through activity tolerance  Over the next 90 days, patient will verbalize basic understanding of COPD disease process and self care activities  Over the next 90 days, patient will not be hospitalized for COPD exacerbation   Interventions:   Provided patient with basic written and verbal COPD education on self care/management/and exacerbation prevention   Provided patient with COPD action plan and reinforced importance of daily self assessment  Advised patient to self assesses COPD action plan zone and make appointment with provider if in the yellow zone for 48 hours without improvement.  Provided patient with education about the role of exercise in the management of COPD  Advised patient to engage in light exercise as tolerated 3-5 days a week  Provided education about and advised patient to utilize infection prevention strategies to reduce risk of respiratory infection   Patient Self Care Activities:  . Takes medications as prescribed including inhalers . Self assesses COPD action plan zone and makes appointment with provider if in the yellow zone for 48 hours without improvement. . Engages in light exercise 3-5 days a week . Utilizes infection prevention  strategies to reduce risk of respiratory infection   Initial goal documentation        Assessment:  Patient is doing purse lip breathing Patient uses a pulse ox Shortness of breath with activity Patient is using nebulizer  Plan: Provided Exercise activity booklet Discussed medication adherence Discussed booster shot for COVID vaccine Discussed flu shot Discussed eye exam Provided PCP update assessment  RN will follow up within the month of December [facility] [place] [facility] [phone]

## 2020-04-19 ENCOUNTER — Emergency Department (HOSPITAL_COMMUNITY): Payer: Medicare HMO

## 2020-04-19 ENCOUNTER — Encounter (HOSPITAL_COMMUNITY): Payer: Self-pay | Admitting: Physician Assistant

## 2020-04-19 ENCOUNTER — Inpatient Hospital Stay (HOSPITAL_COMMUNITY)
Admission: EM | Admit: 2020-04-19 | Discharge: 2020-04-24 | DRG: 177 | Disposition: A | Discharge status: Home Health | Payer: Medicare HMO | Attending: Internal Medicine | Admitting: Internal Medicine

## 2020-04-19 ENCOUNTER — Other Ambulatory Visit: Payer: Self-pay

## 2020-04-19 DIAGNOSIS — E119 Type 2 diabetes mellitus without complications: Secondary | ICD-10-CM

## 2020-04-19 DIAGNOSIS — R69 Illness, unspecified: Secondary | ICD-10-CM | POA: Diagnosis not present

## 2020-04-19 DIAGNOSIS — Z743 Need for continuous supervision: Secondary | ICD-10-CM | POA: Diagnosis not present

## 2020-04-19 DIAGNOSIS — J1282 Pneumonia due to coronavirus disease 2019: Secondary | ICD-10-CM | POA: Diagnosis present

## 2020-04-19 DIAGNOSIS — Z9109 Other allergy status, other than to drugs and biological substances: Secondary | ICD-10-CM | POA: Diagnosis not present

## 2020-04-19 DIAGNOSIS — U071 COVID-19: Secondary | ICD-10-CM | POA: Diagnosis not present

## 2020-04-19 DIAGNOSIS — J189 Pneumonia, unspecified organism: Secondary | ICD-10-CM | POA: Diagnosis not present

## 2020-04-19 DIAGNOSIS — J9601 Acute respiratory failure with hypoxia: Secondary | ICD-10-CM | POA: Diagnosis not present

## 2020-04-19 DIAGNOSIS — I248 Other forms of acute ischemic heart disease: Secondary | ICD-10-CM | POA: Diagnosis present

## 2020-04-19 DIAGNOSIS — I358 Other nonrheumatic aortic valve disorders: Secondary | ICD-10-CM | POA: Diagnosis present

## 2020-04-19 DIAGNOSIS — J439 Emphysema, unspecified: Secondary | ICD-10-CM | POA: Diagnosis not present

## 2020-04-19 DIAGNOSIS — I493 Ventricular premature depolarization: Secondary | ICD-10-CM | POA: Diagnosis not present

## 2020-04-19 DIAGNOSIS — R778 Other specified abnormalities of plasma proteins: Secondary | ICD-10-CM | POA: Diagnosis not present

## 2020-04-19 DIAGNOSIS — Z833 Family history of diabetes mellitus: Secondary | ICD-10-CM | POA: Diagnosis not present

## 2020-04-19 DIAGNOSIS — I10 Essential (primary) hypertension: Secondary | ICD-10-CM | POA: Diagnosis not present

## 2020-04-19 DIAGNOSIS — Z87891 Personal history of nicotine dependence: Secondary | ICD-10-CM | POA: Diagnosis not present

## 2020-04-19 DIAGNOSIS — M1990 Unspecified osteoarthritis, unspecified site: Secondary | ICD-10-CM | POA: Diagnosis present

## 2020-04-19 DIAGNOSIS — F329 Major depressive disorder, single episode, unspecified: Secondary | ICD-10-CM | POA: Diagnosis present

## 2020-04-19 DIAGNOSIS — Z7951 Long term (current) use of inhaled steroids: Secondary | ICD-10-CM

## 2020-04-19 DIAGNOSIS — R509 Fever, unspecified: Secondary | ICD-10-CM | POA: Diagnosis not present

## 2020-04-19 DIAGNOSIS — R000 Tachycardia, unspecified: Secondary | ICD-10-CM | POA: Diagnosis present

## 2020-04-19 DIAGNOSIS — F0390 Unspecified dementia without behavioral disturbance: Secondary | ICD-10-CM | POA: Diagnosis present

## 2020-04-19 DIAGNOSIS — R7302 Impaired glucose tolerance (oral): Secondary | ICD-10-CM | POA: Diagnosis present

## 2020-04-19 DIAGNOSIS — J449 Chronic obstructive pulmonary disease, unspecified: Secondary | ICD-10-CM | POA: Diagnosis present

## 2020-04-19 DIAGNOSIS — J431 Panlobular emphysema: Secondary | ICD-10-CM | POA: Diagnosis not present

## 2020-04-19 DIAGNOSIS — J069 Acute upper respiratory infection, unspecified: Secondary | ICD-10-CM | POA: Diagnosis present

## 2020-04-19 DIAGNOSIS — I2510 Atherosclerotic heart disease of native coronary artery without angina pectoris: Secondary | ICD-10-CM | POA: Diagnosis present

## 2020-04-19 DIAGNOSIS — J90 Pleural effusion, not elsewhere classified: Secondary | ICD-10-CM | POA: Diagnosis not present

## 2020-04-19 DIAGNOSIS — G4733 Obstructive sleep apnea (adult) (pediatric): Secondary | ICD-10-CM | POA: Diagnosis present

## 2020-04-19 DIAGNOSIS — R0602 Shortness of breath: Secondary | ICD-10-CM | POA: Diagnosis not present

## 2020-04-19 DIAGNOSIS — E785 Hyperlipidemia, unspecified: Secondary | ICD-10-CM | POA: Diagnosis present

## 2020-04-19 DIAGNOSIS — R0902 Hypoxemia: Secondary | ICD-10-CM | POA: Diagnosis present

## 2020-04-19 DIAGNOSIS — M8580 Other specified disorders of bone density and structure, unspecified site: Secondary | ICD-10-CM | POA: Diagnosis present

## 2020-04-19 DIAGNOSIS — R531 Weakness: Secondary | ICD-10-CM | POA: Diagnosis not present

## 2020-04-19 DIAGNOSIS — E538 Deficiency of other specified B group vitamins: Secondary | ICD-10-CM | POA: Diagnosis present

## 2020-04-19 DIAGNOSIS — N400 Enlarged prostate without lower urinary tract symptoms: Secondary | ICD-10-CM | POA: Diagnosis present

## 2020-04-19 DIAGNOSIS — R279 Unspecified lack of coordination: Secondary | ICD-10-CM | POA: Diagnosis not present

## 2020-04-19 DIAGNOSIS — R7989 Other specified abnormal findings of blood chemistry: Secondary | ICD-10-CM | POA: Diagnosis present

## 2020-04-19 DIAGNOSIS — J441 Chronic obstructive pulmonary disease with (acute) exacerbation: Secondary | ICD-10-CM | POA: Diagnosis not present

## 2020-04-19 DIAGNOSIS — J9621 Acute and chronic respiratory failure with hypoxia: Secondary | ICD-10-CM | POA: Diagnosis not present

## 2020-04-19 DIAGNOSIS — Z79899 Other long term (current) drug therapy: Secondary | ICD-10-CM

## 2020-04-19 LAB — CBC WITH DIFFERENTIAL/PLATELET
Abs Immature Granulocytes: 0.05 10*3/uL (ref 0.00–0.07)
Basophils Absolute: 0 10*3/uL (ref 0.0–0.1)
Basophils Relative: 0 %
Eosinophils Absolute: 0 10*3/uL (ref 0.0–0.5)
Eosinophils Relative: 1 %
HCT: 33.8 % — ABNORMAL LOW (ref 39.0–52.0)
Hemoglobin: 10.9 g/dL — ABNORMAL LOW (ref 13.0–17.0)
Immature Granulocytes: 2 %
Lymphocytes Relative: 10 %
Lymphs Abs: 0.3 10*3/uL — ABNORMAL LOW (ref 0.7–4.0)
MCH: 27.6 pg (ref 26.0–34.0)
MCHC: 32.2 g/dL (ref 30.0–36.0)
MCV: 85.6 fL (ref 80.0–100.0)
Monocytes Absolute: 1.1 10*3/uL — ABNORMAL HIGH (ref 0.1–1.0)
Monocytes Relative: 33 %
Neutro Abs: 1.8 10*3/uL (ref 1.7–7.7)
Neutrophils Relative %: 54 %
Platelets: 111 10*3/uL — ABNORMAL LOW (ref 150–400)
RBC: 3.95 MIL/uL — ABNORMAL LOW (ref 4.22–5.81)
RDW: 13.9 % (ref 11.5–15.5)
WBC: 3.3 10*3/uL — ABNORMAL LOW (ref 4.0–10.5)
nRBC: 0 % (ref 0.0–0.2)

## 2020-04-19 LAB — COMPREHENSIVE METABOLIC PANEL
ALT: 20 U/L (ref 0–44)
AST: 30 U/L (ref 15–41)
Albumin: 3.1 g/dL — ABNORMAL LOW (ref 3.5–5.0)
Alkaline Phosphatase: 64 U/L (ref 38–126)
Anion gap: 14 (ref 5–15)
BUN: 19 mg/dL (ref 8–23)
CO2: 21 mmol/L — ABNORMAL LOW (ref 22–32)
Calcium: 8.7 mg/dL — ABNORMAL LOW (ref 8.9–10.3)
Chloride: 99 mmol/L (ref 98–111)
Creatinine, Ser: 0.87 mg/dL (ref 0.61–1.24)
GFR calc Af Amer: >60 mL/min (ref >60)
GFR calc non Af Amer: >60 mL/min (ref >60)
Glucose, Bld: 150 mg/dL — ABNORMAL HIGH (ref 70–99)
Potassium: 3.4 mmol/L — ABNORMAL LOW (ref 3.5–5.1)
Sodium: 134 mmol/L — ABNORMAL LOW (ref 135–145)
Total Bilirubin: 0.7 mg/dL (ref 0.3–1.2)
Total Protein: 6.5 g/dL (ref 6.5–8.1)

## 2020-04-19 LAB — I-STAT CHEM 8, ED
BUN: 18 mg/dL (ref 8–23)
Calcium, Ion: 1.24 mmol/L (ref 1.15–1.40)
Chloride: 95 mmol/L — ABNORMAL LOW (ref 98–111)
Creatinine, Ser: 0.9 mg/dL (ref 0.61–1.24)
Glucose, Bld: 153 mg/dL — ABNORMAL HIGH (ref 70–99)
HCT: 34 % — ABNORMAL LOW (ref 39.0–52.0)
Hemoglobin: 11.6 g/dL — ABNORMAL LOW (ref 13.0–17.0)
Potassium: 3.8 mmol/L (ref 3.5–5.1)
Sodium: 133 mmol/L — ABNORMAL LOW (ref 135–145)
TCO2: 22 mmol/L (ref 22–32)

## 2020-04-19 LAB — TROPONIN I (HIGH SENSITIVITY)
Troponin I (High Sensitivity): 50 ng/L — ABNORMAL HIGH (ref <18)
Troponin I (High Sensitivity): 44 ng/L — ABNORMAL HIGH (ref <18)

## 2020-04-19 LAB — LACTIC ACID, PLASMA: Lactic Acid, Venous: 1.2 mmol/L (ref 0.5–1.9)

## 2020-04-19 LAB — BRAIN NATRIURETIC PEPTIDE: B Natriuretic Peptide: 247.9 pg/mL — ABNORMAL HIGH (ref 0.0–100.0)

## 2020-04-19 LAB — ETHANOL: Alcohol, Ethyl (B): <10 mg/dL (ref <10)

## 2020-04-19 LAB — PROTIME-INR
INR: 1.1 (ref 0.8–1.2)
Prothrombin Time: 13.7 seconds (ref 11.4–15.2)

## 2020-04-19 LAB — APTT: aPTT: 38 seconds — ABNORMAL HIGH (ref 24–36)

## 2020-04-19 LAB — SARS CORONAVIRUS 2 BY RT PCR (HOSPITAL ORDER, PERFORMED IN ~~LOC~~ HOSPITAL LAB): SARS Coronavirus 2: POSITIVE — AB

## 2020-04-19 LAB — CBG MONITORING, ED: Glucose-Capillary: 149 mg/dL — ABNORMAL HIGH (ref 70–99)

## 2020-04-19 MED ORDER — METRONIDAZOLE IN NACL 5-0.79 MG/ML-% IV SOLN
500.0000 mg | Freq: Once | INTRAVENOUS | Status: AC
  Administered 2020-04-19: 500 mg via INTRAVENOUS
  Filled 2020-04-19: qty 100

## 2020-04-19 MED ORDER — INSULIN ASPART 100 UNIT/ML ~~LOC~~ SOLN
0.0000 [IU] | SUBCUTANEOUS | Status: DC
  Administered 2020-04-20 (×2): 2 [IU] via SUBCUTANEOUS
  Filled 2020-04-19: qty 0.15

## 2020-04-19 MED ORDER — ENOXAPARIN SODIUM 40 MG/0.4ML ~~LOC~~ SOLN: 40.0000 mg | SUBCUTANEOUS | Status: DC

## 2020-04-19 MED ORDER — SODIUM CHLORIDE 0.9 % IV SOLN
2.0000 g | Freq: Once | INTRAVENOUS | Status: AC
  Administered 2020-04-19: 2 g via INTRAVENOUS
  Filled 2020-04-19: qty 2

## 2020-04-19 MED ORDER — SODIUM CHLORIDE 0.9 % IV SOLN
200.0000 mg | Freq: Once | INTRAVENOUS | Status: AC
  Administered 2020-04-20: 200 mg via INTRAVENOUS
  Filled 2020-04-19: qty 200

## 2020-04-19 MED ORDER — VANCOMYCIN HCL 1500 MG/300ML IV SOLN
1500.0000 mg | Freq: Once | INTRAVENOUS | Status: AC
  Administered 2020-04-19: 1500 mg via INTRAVENOUS
  Filled 2020-04-19: qty 300

## 2020-04-19 MED ORDER — DEXAMETHASONE SODIUM PHOSPHATE 10 MG/ML IJ SOLN
6.0000 mg | Freq: Every day | INTRAMUSCULAR | Status: DC
  Administered 2020-04-20 – 2020-04-21 (×3): 6 mg via INTRAVENOUS
  Filled 2020-04-19 (×3): qty 1

## 2020-04-19 MED ORDER — SODIUM CHLORIDE 0.9 % IV SOLN
100.0000 mg | Freq: Every day | INTRAVENOUS | Status: AC
  Administered 2020-04-20 – 2020-04-23 (×4): 100 mg via INTRAVENOUS
  Filled 2020-04-19 (×4): qty 20

## 2020-04-19 MED ORDER — ACETAMINOPHEN 325 MG PO TABS: 650.0000 mg | ORAL_TABLET | Freq: Four times a day (QID) | ORAL | Status: DC | PRN

## 2020-04-19 MED ORDER — ACETAMINOPHEN 650 MG RE SUPP
650.0000 mg | Freq: Once | RECTAL | Status: AC
  Administered 2020-04-19: 650 mg via RECTAL
  Filled 2020-04-19: qty 1

## 2020-04-19 MED ORDER — LACTATED RINGERS IV BOLUS (SEPSIS)
1000.0000 mL | Freq: Once | INTRAVENOUS | Status: AC
  Administered 2020-04-19: 1000 mL via INTRAVENOUS

## 2020-04-19 MED ORDER — LACTATED RINGERS IV SOLN: INTRAVENOUS | Status: AC

## 2020-04-19 MED ORDER — ASPIRIN EC 81 MG PO TBEC
81.0000 mg | DELAYED_RELEASE_TABLET | Freq: Every day | ORAL | Status: DC
  Administered 2020-04-20 – 2020-04-24 (×5): 81 mg via ORAL
  Filled 2020-04-19 (×5): qty 1

## 2020-04-19 MED ORDER — VANCOMYCIN HCL IN DEXTROSE 1-5 GM/200ML-% IV SOLN: 1000.0000 mg | Freq: Once | INTRAVENOUS | Status: DC

## 2020-04-19 NOTE — ED Provider Notes (Signed)
Cory Kemp   CSN: [id number] Arrival date & time: 04/19/20  1912     History Chief Complaint  Patient presents with   Shortness of Breath    Cory Kemp is a 79 y.o. male with a past medical history of questionable dementia, diabetes, hypertension, hyperlipidemia, who presents today for evaluation of shortness of breath.  He normally wears oxygen at home.  He is unable to tell me how much.  He reportedly drove himself here, and on his pulse oximetry in the car was in the 70s.  Unknown if he was on oxygen at that time.  When I asked patient when his breathing got bad he tells me "years ago."  He is unable to further specify.  He states he has gotten both of his Covid vaccines, however states that it was "years ago" so unsure if he is actually vaccinated.   He denies any chest pain.  He was noted to have a bowel movement in his pants.    Level 5 caveat, patient is unable to answer questions, states "I don't know." confused.   HPI     Past Medical History:  Diagnosis Date   Alcohol abuse    Anemia    Arthritis    Asthma    B12 deficiency 11/03/2013   BPH (benign prostatic hypertrophy)    COPD (chronic obstructive pulmonary disease) (HCC)    Dementia (HCC)    ?   Diabetes mellitus ([place]) 01/10/2012   Femoral hernia, right 03/27/2012   Glucose intolerance (impaired glucose tolerance)    Hyperlipidemia    Hypertension    Incisional hernias - swiss-cheese-type periumbilical 02/06/5365   Inguinal hernia - right 01/10/2012   Leukopenia 01/29/2013   Monocytosis 01/29/2013   Normochromic anemia 01/29/2013   Osteopenia     Patient Active Problem List   Diagnosis Date Noted   Medication management 03/05/2019   Acute exacerbation of chronic obstructive pulmonary disease (COPD) ([place]) 02/11/2019   COPD with acute exacerbation ([place]) 02/08/2019   Hypertensive urgency 02/08/2019   CAP (community acquired  pneumonia) 12/25/2018   Acute on chronic respiratory failure with hypoxia ([place]) 12/25/2018   Elevated d-dimer 12/25/2018   Chronic respiratory failure with hypoxia ([place]) 12/13/2018   Abnormal finding on EKG 01/03/2018   OSA (obstructive sleep apnea) 06/14/2017   Sinus tachycardia 04/11/2016   Essential hypertension 04/11/2016   COPD (chronic obstructive pulmonary disease) ([place]) 01/11/2016   B12 deficiency 11/03/2013   Leukopenia 01/29/2013   Monocytosis 01/29/2013   Normochromic anemia 01/29/2013   Former smoker 03/27/2012   Diabetes mellitus ([place]) 01/10/2012    Past Surgical History:  Procedure Laterality Date   HEMORRHOID SURGERY     HERNIA REPAIR     INGUINAL HERNIA REPAIR  02/24/2012   Procedure: LAPAROSCOPIC INGUINAL HERNIA;  Surgeon: Adin Hector, MD;  Location: [facility];  Service: General;  Laterality: N/A;   TUMOR EXCISION     intestinal after SBO ( Benign), appendectomy   VENTRAL HERNIA REPAIR  02/24/2012   Procedure: LAPAROSCOPIC VENTRAL HERNIA;  Surgeon: Adin Hector, MD;  Location: [facility];  Service: General;  Laterality: N/A;  lysis of adhesions, excision of abdominal wall lipomae       Family History  Problem Relation Age of Onset   Lung cancer Father    Lung cancer Mother    Diabetes Brother     Social History   Tobacco Use   Smoking status: Former Smoker  Packs/day: 1.00    Years: 63.00    Pack years: 63.00    Types: Cigarettes    Start date: 08/09/1955    Quit date: 08/08/2018    Years since quitting: 1.6   Smokeless tobacco: Never Used  Vaping Use   Vaping Use: Never used  Substance Use Topics   Alcohol use: No    Alcohol/week: 0.0 standard drinks   Drug use: No    Home Medications Prior to Admission medications   Medication Sig Start Date End Date Taking? Authorizing Provider  albuterol (PROVENTIL) (2.5 MG/3ML) 0.083% nebulizer solution Take 3 mLs (2.5 mg total) by nebulization every 6 (six) hours as needed for  wheezing or shortness of breath. 01/03/18   Lauraine Rinne, NP  Cholecalciferol (VITAMIN D-3) 25 MCG (1000 UT) CAPS Take 1,000 Units by mouth at bedtime.    [provider]  colchicine 0.6 MG tablet Take 1 tablet (0.6 mg total) by mouth daily for 3 doses. 11/28/19 12/01/19  Tedd Sias, PA  Cyanocobalamin (VITAMIN B-12) 5000 MCG TBDP Take 5,000 mcg by mouth at bedtime.     [provider]  cyclobenzaprine (FLEXERIL) 10 MG tablet Take 10 mg by mouth at bedtime as needed for muscle spasms.  08/19/19   [provider]  DALIRESP 250 MCG TABS Take 250 mg by mouth at bedtime. 08/27/19   [provider]  diclofenac Sodium (VOLTAREN) 1 % GEL Apply 4 g topically 4 (four) times daily. 09/17/19   Deno Etienne, DO  escitalopram (LEXAPRO) 10 MG tablet Take 10 mg by mouth at bedtime.  08/23/19   [provider]  fluticasone (FLONASE) 50 MCG/ACT nasal spray Place 2 sprays into both nostrils daily. Reduce to 1 spray each nostril daily after stopping Afrin Patient taking differently: Place 2 sprays into both nostrils daily.  03/20/19   Mannam, Hart Robinsons, MD  HYDROcodone-acetaminophen (NORCO/VICODIN) 5-325 MG tablet Take 1 tablet by mouth every 4 (four) hours as needed. 12/28/19   Isla Pence, MD  ibuprofen (ADVIL) 200 MG tablet Take 400 mg by mouth at bedtime.    [provider]  losartan (COZAAR) 50 MG tablet Take 50 mg by mouth at bedtime.  12/26/11   [provider]  OXYGEN Inhale 4 L into the lungs continuous.    [provider]  roflumilast (DALIRESP) 500 MCG TABS tablet Take 1 tablet (500 mcg total) by mouth daily. 05/15/19   Mannam, Hart Robinsons, MD  simvastatin (ZOCOR) 40 MG tablet Take 40 mg by mouth at bedtime.  12/27/11   [provider]  Donnal Debar 100-62.5-25 MCG/INH AEPB INHALE 1 PUFF BY MOUTH EVERY DAY 02/17/20   Martyn Ehrich, NP  VENTOLIN HFA 108 (90 Base) MCG/ACT inhaler Inhale 2 puffs into the lungs every 4 (four) hours as  needed for wheezing or shortness of breath. 01/21/20   [name], MD    Allergies    Adhesive [tape]  Review of Systems   Review of Systems  Unable to perform ROS: Mental status change    Physical Exam Updated Vital Signs BP 136/63    Pulse 100    Temp (!) 100.7 F (38.2 C) (Oral)    Resp (!) 35    SpO2 (!) 89%   Physical Exam Vitals and nursing Kemp reviewed.  Constitutional:      Appearance: He is ill-appearing.     Comments: Frail, elderly appearing  HENT:     Head: Normocephalic and atraumatic.  Eyes:  Conjunctiva/sclera: Conjunctivae normal.  Cardiovascular:     Rate and Rhythm: Regular rhythm. Tachycardia present.     Heart sounds: No murmur heard.   Pulmonary:     Effort: Tachypnea present.     Comments: Lung sounds present bilaterally, however unable to auscultate specific sounds due to patient vocalizing with breathing. Abdominal:     Palpations: Abdomen is soft.     Tenderness: There is no abdominal tenderness.  Musculoskeletal:     Cervical back: Normal range of motion and neck supple.  Skin:    General: Skin is warm and dry.  Neurological:     Mental Status: He is alert.     Comments: Patient is confused, he knows it is 2021, however when answering questions is obviously confused.  Follows commands. Moves bilateral arms and legs.      ED Results / Procedures / Treatments   Labs (all labs ordered are listed, but only abnormal results are displayed) Labs Reviewed  SARS CORONAVIRUS 2 BY RT PCR ([place]) - Abnormal; Notable for the following components:      Result Value   SARS Coronavirus 2 POSITIVE (*)    All other components within normal limits  COMPREHENSIVE METABOLIC PANEL - Abnormal; Notable for the following components:   Sodium 134 (*)    Potassium 3.4 (*)    CO2 21 (*)    Glucose, Bld 150 (*)    Calcium 8.7 (*)    Albumin 3.1 (*)    All other components within normal limits  CBC WITH  DIFFERENTIAL/PLATELET - Abnormal; Notable for the following components:   WBC 3.3 (*)    RBC 3.95 (*)    Hemoglobin 10.9 (*)    HCT 33.8 (*)    Platelets 111 (*)    Lymphs Abs 0.3 (*)    Monocytes Absolute 1.1 (*)    All other components within normal limits  APTT - Abnormal; Notable for the following components:   aPTT 38 (*)    All other components within normal limits  BRAIN NATRIURETIC PEPTIDE - Abnormal; Notable for the following components:   B Natriuretic Peptide 247.9 (*)    All other components within normal limits  CBG MONITORING, ED - Abnormal; Notable for the following components:   Glucose-Capillary 149 (*)    All other components within normal limits  I-STAT CHEM 8, ED - Abnormal; Notable for the following components:   Sodium 133 (*)    Chloride 95 (*)    Glucose, Bld 153 (*)    Hemoglobin 11.6 (*)    HCT 34.0 (*)    All other components within normal limits  TROPONIN I (HIGH SENSITIVITY) - Abnormal; Notable for the following components:   Troponin I (High Sensitivity) 50 (*)    All other components within normal limits  TROPONIN I (HIGH SENSITIVITY) - Abnormal; Notable for the following components:   Troponin I (High Sensitivity) 44 (*)    All other components within normal limits  CULTURE, BLOOD (ROUTINE X 2)  CULTURE, BLOOD (ROUTINE X 2)  URINE CULTURE  LACTIC ACID, PLASMA  PROTIME-INR  ETHANOL  LACTIC ACID, PLASMA  URINALYSIS, ROUTINE W REFLEX MICROSCOPIC  RAPID URINE DRUG SCREEN, HOSP PERFORMED    EKG EKG Interpretation  Date/Time:  Sunday April 19 2020 19:57:12 EDT Ventricular Rate:  130 PR Interval:    QRS Duration: 105 QT Interval:  315 QTC Calculation: 442 R Axis:   88 Text Interpretation: Sinus tachycardia Ventricular  bigeminy Borderline right axis deviation Probable anteroseptal infarct, old Confirmed by Quintella Reichert [phone]) on 04/19/2020 8:17:56 PM   Radiology DG Chest Port 1 View  Result Date: 04/19/2020 CLINICAL DATA:   Shortness of breath EXAM: PORTABLE CHEST 1 VIEW COMPARISON:  04/10/2019, CT 02/11/2019 FINDINGS: Hyperinflation with emphysematous disease. No acute consolidation or pleural effusion. Probable scarring at the bases. Stable cardiomediastinal silhouette with aortic atherosclerosis. No pneumothorax. IMPRESSION: Hyperinflation with emphysematous disease and probable scarring at the bases. Electronically Signed   By: Donavan Foil M.D.   On: 04/19/2020 20:13    Procedures .Critical Care Performed by: Lorin Glass, PA-C Authorized by: Lorin Glass, PA-C   Critical care provider statement:    Critical care time (minutes):  45   Critical care was time spent personally by me on the following activities:  Discussions with consultants, evaluation of patient's response to treatment, examination of patient, ordering and performing treatments and interventions, ordering and review of laboratory studies, ordering and review of radiographic studies, pulse oximetry, re-evaluation of patient's condition, obtaining history from patient or surrogate and review of old charts   (including critical care time)  Medications Ordered in ED Medications  lactated ringers infusion ( Intravenous New Bag/Given 04/19/20 2023)  acetaminophen (TYLENOL) suppository 650 mg (650 mg Rectal Given 04/19/20 1955)  lactated ringers bolus 1,000 mL (0 mLs Intravenous Stopped 04/19/20 2146)  ceFEPIme (MAXIPIME) 2 g in sodium chloride 0.9 % 100 mL IVPB (0 g Intravenous Stopped 04/19/20 2020)  metroNIDAZOLE (FLAGYL) IVPB 500 mg ( Intravenous Stopped 04/19/20 2118)  vancomycin (VANCOREADY) IVPB 1500 mg/300 mL (1,500 mg Intravenous New Bag/Given 04/19/20 2146)    ED Course  I have reviewed the triage vital signs and the nursing notes.  Pertinent labs & imaging results that were available during my care of the patient were reviewed by me and considered in my medical decision making (see chart for details).  Clinical Course as  of Apr 20 117  Nancy Fetter Apr 19, 2020  2054 Question Covid.    WBC(!): 3.3 [EH]  Mon Apr 20, 2020  0005 I spoke with Dr. Vallery Ridge who will see the patient, requested I consult critical care for pulmonary recommendations.    [EH]    Clinical Course User Index [EH] Ollen Gross   MDM Rules/Calculators/A&P                         Patient is a 79 year old man who presents today for evaluation of hypoxia.  He is unable to provide history regarding recent events.  On my initial exam he is febrile with a rectal temp of 104.3, tachycardic into the 130s.  On room air he is hypoxic into the 80s, is placed on nasal cannula after which he increased into the 90s.  He appears short of breath.  He had a bowel movement and his pants and appears confused.  Unclear if this is acute, however suspect it is as he drove himself here.    Code sepsis is called, patient is started on broad-spectrum antibiotics.  Covid testing is sent.  Rectal Tylenol is ordered.  CBC shows leukopenia of 3.3.  Mild anemia at 10.9.  CMP shows mild hyponatremia, hypokalemia and hyperglycemia.  BNP is slightly elevated at 249.  He is given 1 L of IV fluids.  Blood and urine cultures are sent.  Initial troponin is elevated at 55, delta is improved at 44, I suspect this is secondary to improving  his tachycardia.  Chest x-ray shows hyperinflation with emphysema and probable bilateral scarring.  Covid test is positive.  Given patient's overall mental status and condition he will require admission in the hospital.    I spoke with specialist who requested PCCM consult.  PCCM will have patient seen by pulmonology in the morning.  Advised that if patient deteriorates they can be repaged and he will be seen sooner.   This patient was seen as a shared visit with Dr. Ralene Bathe.   Kemp: Portions of this report may have been transcribed using voice recognition software. Every effort was made to ensure accuracy; however, inadvertent  computerized transcription errors may be present  Cory Kemp was evaluated in Emergency Department on 04/19/2020 for the symptoms described in the history of present illness. He was evaluated in the context of the global COVID-19 pandemic, which necessitated consideration that the patient might be at risk for infection with the SARS-CoV-2 virus that causes COVID-19. Institutional protocols and algorithms that pertain to the evaluation of patients at risk for COVID-19 are in a state of rapid change based on information released by regulatory bodies including the CDC and federal and state organizations. These policies and algorithms were followed during the patient's care in the ED.   Final Clinical Impression(s) / ED Diagnoses Final diagnoses:  [id number]    Rx / DC Orders ED Discharge Orders    None       Ollen Gross 04/20/20 0119    Quintella Reichert, MD 04/22/20 [phone]

## 2020-04-19 NOTE — ED Notes (Signed)
Blood cultures x2 collected

## 2020-04-19 NOTE — ED Triage Notes (Signed)
Patient arrives POV for shortness of breath. Patient noted to be confused on arrival. Patient normally wears oxygen at home, unsure of how much. Patient noted to be 79% on RA. Patient 89% 2L [place].

## 2020-04-19 NOTE — ED Notes (Signed)
Security came to triage and told us there was a patient outside in th car that was having chest pain and that it was hard to breath. This Probation officer, Joy EMT, Josh EMT, and Clifton Symon NT went to car and got patient in wheelchair. Pt was on the phone in daughter when staff approached car. This Probation officer noticed a portable pulse ox on patients lap. Pulse ox read 79%. This Probation officer also noticed oxygen tank on passenger side of the car, this Probation officer asked daughter is he normally on oxygen she said yes but she was unaware of how many liters. Naomie Dean, RN notified and Benjamine Mola PA notified

## 2020-04-19 NOTE — Progress Notes (Signed)
A consult was received from an ED physician for vancomycin & cefepime per pharmacy dosing.  The patient's profile has been reviewed for ht/wt/allergies/indication/available labs.   A one time order has been placed for vancomycin 1500 mg and cefpime 2 gm and flagyl 500 mg IV x 1 (per MD).  Further antibiotics/pharmacy consults should be ordered by admitting physician if indicated.                       Thank you,  Eudelia Bunch, Pharm.D 04/19/2020 7:31 PM

## 2020-04-20 ENCOUNTER — Other Ambulatory Visit: Payer: Self-pay | Admitting: *Deleted

## 2020-04-20 ENCOUNTER — Inpatient Hospital Stay (HOSPITAL_COMMUNITY): Payer: Medicare HMO

## 2020-04-20 DIAGNOSIS — I10 Essential (primary) hypertension: Secondary | ICD-10-CM

## 2020-04-20 DIAGNOSIS — J441 Chronic obstructive pulmonary disease with (acute) exacerbation: Secondary | ICD-10-CM

## 2020-04-20 DIAGNOSIS — J1282 Pneumonia due to coronavirus disease 2019: Secondary | ICD-10-CM

## 2020-04-20 DIAGNOSIS — U071 COVID-19: Principal | ICD-10-CM | POA: Diagnosis present

## 2020-04-20 DIAGNOSIS — J069 Acute upper respiratory infection, unspecified: Secondary | ICD-10-CM | POA: Diagnosis present

## 2020-04-20 DIAGNOSIS — I493 Ventricular premature depolarization: Secondary | ICD-10-CM | POA: Diagnosis present

## 2020-04-20 DIAGNOSIS — R0902 Hypoxemia: Secondary | ICD-10-CM

## 2020-04-20 DIAGNOSIS — R778 Other specified abnormalities of plasma proteins: Secondary | ICD-10-CM | POA: Diagnosis present

## 2020-04-20 DIAGNOSIS — E119 Type 2 diabetes mellitus without complications: Secondary | ICD-10-CM

## 2020-04-20 DIAGNOSIS — J9601 Acute respiratory failure with hypoxia: Secondary | ICD-10-CM

## 2020-04-20 LAB — CBC WITH DIFFERENTIAL/PLATELET
Abs Immature Granulocytes: 0.18 10*3/uL — ABNORMAL HIGH (ref 0.00–0.07)
Basophils Absolute: 0 10*3/uL (ref 0.0–0.1)
Basophils Relative: 0 %
Eosinophils Absolute: 0 10*3/uL (ref 0.0–0.5)
Eosinophils Relative: 0 %
HCT: 34.6 % — ABNORMAL LOW (ref 39.0–52.0)
Hemoglobin: 11.1 g/dL — ABNORMAL LOW (ref 13.0–17.0)
Immature Granulocytes: 2 %
Lymphocytes Relative: 7 %
Lymphs Abs: 0.6 10*3/uL — ABNORMAL LOW (ref 0.7–4.0)
MCH: 27.6 pg (ref 26.0–34.0)
MCHC: 32.1 g/dL (ref 30.0–36.0)
MCV: 86.1 fL (ref 80.0–100.0)
Monocytes Absolute: 1.3 10*3/uL — ABNORMAL HIGH (ref 0.1–1.0)
Monocytes Relative: 17 %
Neutro Abs: 5.5 10*3/uL (ref 1.7–7.7)
Neutrophils Relative %: 74 %
Platelets: 108 10*3/uL — ABNORMAL LOW (ref 150–400)
RBC: 4.02 MIL/uL — ABNORMAL LOW (ref 4.22–5.81)
RDW: 14.3 % (ref 11.5–15.5)
WBC: 7.5 10*3/uL (ref 4.0–10.5)
nRBC: 0 % (ref 0.0–0.2)

## 2020-04-20 LAB — URINALYSIS, ROUTINE W REFLEX MICROSCOPIC
Bacteria, UA: NONE SEEN
Bilirubin Urine: NEGATIVE
Glucose, UA: NEGATIVE mg/dL
Ketones, ur: 5 mg/dL — AB
Leukocytes,Ua: NEGATIVE
Nitrite: NEGATIVE
Protein, ur: 100 mg/dL — AB
Specific Gravity, Urine: 1.016 (ref 1.005–1.030)
pH: 5 (ref 5.0–8.0)

## 2020-04-20 LAB — ECHOCARDIOGRAM LIMITED: S' Lateral: 2.88 cm

## 2020-04-20 LAB — COMPREHENSIVE METABOLIC PANEL
ALT: 17 U/L (ref 0–44)
AST: 27 U/L (ref 15–41)
Albumin: 2.9 g/dL — ABNORMAL LOW (ref 3.5–5.0)
Alkaline Phosphatase: 57 U/L (ref 38–126)
Anion gap: 11 (ref 5–15)
BUN: 21 mg/dL (ref 8–23)
CO2: 25 mmol/L (ref 22–32)
Calcium: 8.8 mg/dL — ABNORMAL LOW (ref 8.9–10.3)
Chloride: 98 mmol/L (ref 98–111)
Creatinine, Ser: 0.92 mg/dL (ref 0.61–1.24)
GFR calc Af Amer: >60 mL/min (ref >60)
GFR calc non Af Amer: >60 mL/min (ref >60)
Glucose, Bld: 114 mg/dL — ABNORMAL HIGH (ref 70–99)
Potassium: 3.5 mmol/L (ref 3.5–5.1)
Sodium: 134 mmol/L — ABNORMAL LOW (ref 135–145)
Total Bilirubin: 0.4 mg/dL (ref 0.3–1.2)
Total Protein: 6.4 g/dL — ABNORMAL LOW (ref 6.5–8.1)

## 2020-04-20 LAB — LACTATE DEHYDROGENASE: LDH: 101 U/L (ref 98–192)

## 2020-04-20 LAB — TROPONIN I (HIGH SENSITIVITY)
Troponin I (High Sensitivity): 55 ng/L — ABNORMAL HIGH (ref <18)
Troponin I (High Sensitivity): 55 ng/L — ABNORMAL HIGH (ref <18)
Troponin I (High Sensitivity): 49 ng/L — ABNORMAL HIGH (ref <18)

## 2020-04-20 LAB — FIBRINOGEN: Fibrinogen: 558 mg/dL — ABNORMAL HIGH (ref 210–475)

## 2020-04-20 LAB — PROTIME-INR
INR: 1.1 (ref 0.8–1.2)
Prothrombin Time: 14.1 seconds (ref 11.4–15.2)

## 2020-04-20 LAB — CBG MONITORING, ED
Glucose-Capillary: 100 mg/dL — ABNORMAL HIGH (ref 70–99)
Glucose-Capillary: 101 mg/dL — ABNORMAL HIGH (ref 70–99)
Glucose-Capillary: 109 mg/dL — ABNORMAL HIGH (ref 70–99)
Glucose-Capillary: 114 mg/dL — ABNORMAL HIGH (ref 70–99)
Glucose-Capillary: 126 mg/dL — ABNORMAL HIGH (ref 70–99)
Glucose-Capillary: 142 mg/dL — ABNORMAL HIGH (ref 70–99)
Glucose-Capillary: 94 mg/dL (ref 70–99)

## 2020-04-20 LAB — D-DIMER, QUANTITATIVE: D-Dimer, Quant: 1.97 ug/mL-FEU — ABNORMAL HIGH (ref 0.00–0.50)

## 2020-04-20 LAB — URINE CULTURE: Culture: NO GROWTH

## 2020-04-20 LAB — FERRITIN: Ferritin: 112 ng/mL (ref 24–336)

## 2020-04-20 LAB — PROCALCITONIN: Procalcitonin: 3.65 ng/mL

## 2020-04-20 LAB — C-REACTIVE PROTEIN: CRP: 16.9 mg/dL — ABNORMAL HIGH (ref <1.0)

## 2020-04-20 LAB — HEMOGLOBIN A1C
Hgb A1c MFr Bld: 5.9 % — ABNORMAL HIGH (ref 4.8–5.6)
Mean Plasma Glucose: 122.63 mg/dL

## 2020-04-20 MED ORDER — ESCITALOPRAM OXALATE 10 MG PO TABS
10.0000 mg | ORAL_TABLET | Freq: Every day | ORAL | Status: DC
  Administered 2020-04-20 – 2020-04-23 (×4): 10 mg via ORAL
  Filled 2020-04-20 (×4): qty 1

## 2020-04-20 MED ORDER — INSULIN ASPART 100 UNIT/ML ~~LOC~~ SOLN
0.0000 [IU] | Freq: Three times a day (TID) | SUBCUTANEOUS | Status: DC
  Administered 2020-04-21 – 2020-04-22 (×2): 2 [IU] via SUBCUTANEOUS
  Administered 2020-04-22: 3 [IU] via SUBCUTANEOUS
  Administered 2020-04-22: 2 [IU] via SUBCUTANEOUS
  Administered 2020-04-23: 3 [IU] via SUBCUTANEOUS
  Administered 2020-04-23: 2 [IU] via SUBCUTANEOUS
  Administered 2020-04-23: 5 [IU] via SUBCUTANEOUS
  Administered 2020-04-24: 3 [IU] via SUBCUTANEOUS
  Administered 2020-04-24: 2 [IU] via SUBCUTANEOUS
  Filled 2020-04-20: qty 0.15

## 2020-04-20 MED ORDER — ATORVASTATIN CALCIUM 40 MG PO TABS
40.0000 mg | ORAL_TABLET | Freq: Every day | ORAL | Status: DC
  Administered 2020-04-20 – 2020-04-24 (×5): 40 mg via ORAL
  Filled 2020-04-20 (×5): qty 1

## 2020-04-20 MED ORDER — IPRATROPIUM-ALBUTEROL 0.5-2.5 (3) MG/3ML IN SOLN: 3.0000 mL | Freq: Four times a day (QID) | RESPIRATORY_TRACT | Status: DC

## 2020-04-20 MED ORDER — SENNOSIDES-DOCUSATE SODIUM 8.6-50 MG PO TABS: 1.0000 | ORAL_TABLET | Freq: Every evening | ORAL | Status: DC | PRN

## 2020-04-20 MED ORDER — VANCOMYCIN HCL IN DEXTROSE 1-5 GM/200ML-% IV SOLN
1000.0000 mg | Freq: Two times a day (BID) | INTRAVENOUS | Status: DC
  Administered 2020-04-20 (×2): 1000 mg via INTRAVENOUS
  Filled 2020-04-20 (×2): qty 200

## 2020-04-20 MED ORDER — LIP MEDEX EX OINT
1.0000 "application " | TOPICAL_OINTMENT | CUTANEOUS | Status: DC | PRN
  Administered 2020-04-20: 1 via TOPICAL
  Filled 2020-04-20: qty 7

## 2020-04-20 MED ORDER — LOSARTAN POTASSIUM 50 MG PO TABS
50.0000 mg | ORAL_TABLET | Freq: Every day | ORAL | Status: DC
  Administered 2020-04-20 – 2020-04-23 (×4): 50 mg via ORAL
  Filled 2020-04-20 (×4): qty 1

## 2020-04-20 MED ORDER — UMECLIDINIUM BROMIDE 62.5 MCG/INH IN AEPB
1.0000 | INHALATION_SPRAY | Freq: Every day | RESPIRATORY_TRACT | Status: DC
  Administered 2020-04-21 – 2020-04-23 (×3): 1 via RESPIRATORY_TRACT
  Filled 2020-04-20 (×2): qty 7

## 2020-04-20 MED ORDER — ROFLUMILAST 500 MCG PO TABS
250.0000 ug | ORAL_TABLET | Freq: Every day | ORAL | Status: DC
  Administered 2020-04-20 – 2020-04-24 (×5): 250 ug via ORAL
  Filled 2020-04-20 (×6): qty 1

## 2020-04-20 MED ORDER — HEPARIN SODIUM (PORCINE) 5000 UNIT/ML IJ SOLN
5000.0000 [IU] | Freq: Three times a day (TID) | INTRAMUSCULAR | Status: DC
  Administered 2020-04-20 – 2020-04-24 (×12): 5000 [IU] via SUBCUTANEOUS
  Filled 2020-04-20 (×13): qty 1

## 2020-04-20 MED ORDER — SODIUM CHLORIDE 0.9 % IV SOLN
2.0000 g | Freq: Three times a day (TID) | INTRAVENOUS | Status: DC
  Administered 2020-04-20 – 2020-04-21 (×4): 2 g via INTRAVENOUS
  Filled 2020-04-20 (×4): qty 2

## 2020-04-20 MED ORDER — ACETAMINOPHEN 325 MG PO TABS
650.0000 mg | ORAL_TABLET | Freq: Four times a day (QID) | ORAL | Status: DC | PRN
  Administered 2020-04-20 – 2020-04-23 (×4): 650 mg via ORAL
  Filled 2020-04-20 (×4): qty 2

## 2020-04-20 MED ORDER — ACETAMINOPHEN 650 MG RE SUPP: 650.0000 mg | Freq: Four times a day (QID) | RECTAL | Status: DC | PRN

## 2020-04-20 NOTE — Progress Notes (Signed)
  Echocardiogram 2D Echocardiogram has been performed.  Jennette Dubin 04/20/2020, 3:14 PM

## 2020-04-20 NOTE — Progress Notes (Signed)
Flutters remain on nationwide backorder.

## 2020-04-20 NOTE — Progress Notes (Signed)
Pharmacy Antibiotic Note  Cory Kemp is a 79 y.o. male admitted on 04/19/2020 with pneumonia.  Pharmacy has been consulted for vancomycin dosing.  No weight entered in the ED. Most recent found was at outpatient visit on 01/29/20, which was 81 kg.   Plan: Cefepime 2 gr IV q8h  Vancomycin 1500 mg IV x1, then 1000 mg IV q12h  Monitor clinical course, renal function, cultures as available      Temp (24hrs), Avg:102.5 F (39.2 C), Min:100.7 F (38.2 C), Max:104.3 F (40.2 C)  Recent Labs  Lab 04/19/20 1926 04/19/20 2021  WBC 3.3*  --   CREATININE 0.87 0.90  LATICACIDVEN 1.2  --     CrCl cannot be calculated (Unknown ideal weight.).    Allergies  Allergen Reactions  . Adhesive [Tape] Rash    No "PLASTIC" tape!!! Patient broke out in blisters!!    Antimicrobials this admission: 9/12 cefepime >>  9/12 vancomycin >>  9/13 remdesivir >>   Dose adjustments this admission:   Microbiology results: 9/12 BCx:  9/12 UCx:  9/13 COVID-19: positive    Thank you for allowing pharmacy to be a part of this patient's care.   Royetta Asal, PharmD, BCPS 04/20/2020 3:16 AM

## 2020-04-20 NOTE — H&P (Signed)
History and Physical    Cory Kemp TTS:[id number] DOB: [birth year] DOA: 04/19/2020  PCP: Jani Gravel, MD   Patient coming from: Home  I have personally briefly reviewed patient's old medical records in [place]  Chief Complaint: Hypoxia to 79%  HPI: Cory Kemp is a 79 y.o. male with HTN, DM type II, BPH, stage D COPD on 2 L [place] at home, OSA and questionable dementia who presented with hypoxia and fever.  Patient is somewhat vague about his symptoms however reports that family forced him to get evaluated for fever of 100 today.  Apparently he drove himself to [facility] however could not pull himself out of the car.  ED staff checked his vitals and find his SPO2 to be 79%.  He also had a bowel movement in the driving seat.     ED Course: BP 125/62, HR 58-1 24, RR 20-35, SPO2 79% requiring 4 L to saturate 90s.  His T-max in ED was 104.3. WBC 3.3, platelets 111 Potassium 3.4, BUN 18, creatinine 1.9 Albumin 3.1 Troponin 50, BNP 248  Covid PCR positive, lactic acid 1.2  CXR with emphysema EKG with poor baseline however appears NSR at 130 with frequent PVCs noted.  ED meds: Vancomycin/cefepime/Flagyl Ringer lactate x1 L Ringer lactate maintenance 150 cc/h  Review of Systems: As per HPI otherwise 10 point review of systems negative.    Past Medical History:  Diagnosis Date  . Alcohol abuse   . Anemia   . Arthritis   . Asthma   . B12 deficiency 11/03/2013  . BPH (benign prostatic hypertrophy)   . COPD (chronic obstructive pulmonary disease) ([place])   . Dementia ([place])    ?  [name] Kitchen Diabetes mellitus ([place]) 01/10/2012  . Femoral hernia, right 03/27/2012  . Glucose intolerance (impaired glucose tolerance)   . Hyperlipidemia   . Hypertension   . Incisional hernias - swiss-cheese-type periumbilical 0/04/2329  . Inguinal hernia - right 01/10/2012  . Leukopenia 01/29/2013  . Monocytosis 01/29/2013  . Normochromic anemia 01/29/2013  . Osteopenia     Past Surgical  History:  Procedure Laterality Date  . HEMORRHOID SURGERY    . HERNIA REPAIR    . INGUINAL HERNIA REPAIR  02/24/2012   Procedure: LAPAROSCOPIC INGUINAL HERNIA;  Surgeon: Adin Hector, MD;  Location: [facility];  Service: General;  Laterality: N/A;  . TUMOR EXCISION     intestinal after SBO ( Benign), appendectomy  . VENTRAL HERNIA REPAIR  02/24/2012   Procedure: LAPAROSCOPIC VENTRAL HERNIA;  Surgeon: Adin Hector, MD;  Location: [facility];  Service: General;  Laterality: N/A;  lysis of adhesions, excision of abdominal wall lipomae     reports that he quit smoking about 20 months ago. His smoking use included cigarettes. He started smoking about 64 years ago. He has a 63.00 pack-year smoking history. He has never used smokeless tobacco. He reports that he does not drink alcohol and does not use drugs.  Allergies  Allergen Reactions  . Adhesive [Tape] Rash    No "PLASTIC" tape!!! Patient broke out in blisters!!    Family History  Problem Relation Age of Onset  . Lung cancer Father   . Lung cancer Mother   . Diabetes Brother     Prior to Admission medications   Medication Sig Start Date End Date Taking? Authorizing Provider  albuterol (PROVENTIL) (2.5 MG/3ML) 0.083% nebulizer solution Take 3 mLs (2.5 mg total) by nebulization every 6 (six) hours as needed for wheezing  or shortness of breath. 01/03/18   Lauraine Rinne, NP  Cholecalciferol (VITAMIN D-3) 25 MCG (1000 UT) CAPS Take 1,000 Units by mouth at bedtime.    [provider]  colchicine 0.6 MG tablet Take 1 tablet (0.6 mg total) by mouth daily for 3 doses. 11/28/19 12/01/19  Tedd Sias, PA  Cyanocobalamin (VITAMIN B-12) 5000 MCG TBDP Take 5,000 mcg by mouth at bedtime.     [provider]  cyclobenzaprine (FLEXERIL) 10 MG tablet Take 10 mg by mouth at bedtime as needed for muscle spasms.  08/19/19   [provider]  DALIRESP 250 MCG TABS Take 250 mg by mouth at bedtime. 08/27/19   [provider]    diclofenac Sodium (VOLTAREN) 1 % GEL Apply 4 g topically 4 (four) times daily. 09/17/19   Deno Etienne, DO  escitalopram (LEXAPRO) 10 MG tablet Take 10 mg by mouth at bedtime.  08/23/19   [provider]  fluticasone (FLONASE) 50 MCG/ACT nasal spray Place 2 sprays into both nostrils daily. Reduce to 1 spray each nostril daily after stopping Afrin Patient taking differently: Place 2 sprays into both nostrils daily.  03/20/19   Mannam, Hart Robinsons, MD  HYDROcodone-acetaminophen (NORCO/VICODIN) 5-325 MG tablet Take 1 tablet by mouth every 4 (four) hours as needed. 12/28/19   Isla Pence, MD  ibuprofen (ADVIL) 200 MG tablet Take 400 mg by mouth at bedtime.    [provider]  losartan (COZAAR) 50 MG tablet Take 50 mg by mouth at bedtime.  12/26/11   [provider]  OXYGEN Inhale 4 L into the lungs continuous.    [provider]  roflumilast (DALIRESP) 500 MCG TABS tablet Take 1 tablet (500 mcg total) by mouth daily. 05/15/19   Mannam, Hart Robinsons, MD  simvastatin (ZOCOR) 40 MG tablet Take 40 mg by mouth at bedtime.  12/27/11   [provider]  Donnal Debar 100-62.5-25 MCG/INH AEPB INHALE 1 PUFF BY MOUTH EVERY DAY 02/17/20   Martyn Ehrich, NP  VENTOLIN HFA 108 (90 Base) MCG/ACT inhaler Inhale 2 puffs into the lungs every 4 (four) hours as needed for wheezing or shortness of breath. 01/21/20   [name], MD    Physical Exam: Vitals:   04/19/20 2215 04/19/20 2230 04/19/20 2256 04/20/20 0017  BP: 139/61 136/63  117/66  Pulse: 96 100  84  Resp:    (!) 22  Temp:   (!) 100.7 F (38.2 C)   TempSrc:   Oral   SpO2: 93% (!) 89%  92%    Constitutional: NAD, calm, comfortable Vitals:   04/19/20 2215 04/19/20 2230 04/19/20 2256 04/20/20 0017  BP: 139/61 136/63  117/66  Pulse: 96 100  84  Resp:    (!) 22  Temp:   (!) 100.7 F (38.2 C)   TempSrc:   Oral   SpO2: 93% (!) 89%  92%   Eyes: PERRL, lids and conjunctivae normal ENMT: Mucous membranes are  moist. Posterior pharynx clear of any exudate or lesions.Normal dentition.  Neck: normal, supple, no masses, no thyromegaly Respiratory: clear to auscultation bilaterally, no wheezing, no crackles. Normal respiratory effort. No accessory muscle use.  Cardiovascular: Regular rate and rhythm, no murmurs / rubs / gallops. No extremity edema. 2+ pedal pulses. No carotid bruits.  Abdomen: no tenderness, no masses palpated. No hepatosplenomegaly. Bowel sounds positive.  Musculoskeletal: no clubbing / cyanosis. No joint deformity upper and lower extremities. Good ROM, no contractures. Normal muscle tone.  Skin: no rashes, lesions, ulcers.  No induration Neurologic: CN 2-12 grossly intact. Sensation intact, DTR normal. Strength 5/5 in all 4.  Psychiatric: Normal judgment and insight. Alert and oriented x 3. Normal mood.    Labs on Admission: I have personally reviewed following labs and imaging studies  CBC: Recent Labs  Lab 04/19/20 1926 04/19/20 2021  WBC 3.3*  --   NEUTROABS 1.8  --   HGB 10.9* 11.6*  HCT 33.8* 34.0*  MCV 85.6  --   PLT 111*  --    Basic Metabolic Panel: Recent Labs  Lab 04/19/20 1926 04/19/20 2021  NA 134* 133*  K 3.4* 3.8  CL 99 95*  CO2 21*  --   GLUCOSE 150* 153*  BUN 19 18  CREATININE 0.87 0.90  CALCIUM 8.7*  --    GFR: CrCl cannot be calculated (Unknown ideal weight.). Liver Function Tests: Recent Labs  Lab 04/19/20 1926  AST 30  ALT 20  ALKPHOS 64  BILITOT 0.7  PROT 6.5  ALBUMIN 3.1*   No results for input(s): LIPASE, AMYLASE in the last 168 hours. No results for input(s): AMMONIA in the last 168 hours. Coagulation Profile: Recent Labs  Lab 04/19/20 1926  INR 1.1   Cardiac Enzymes: No results for input(s): CKTOTAL, CKMB, CKMBINDEX, TROPONINI in the last 168 hours. BNP (last 3 results) No results for input(s): PROBNP in the last 8760 hours. HbA1C: No results for input(s): HGBA1C in the last 72 hours. CBG: Recent Labs  Lab  04/19/20 1953 04/20/20 0009  GLUCAP 149* 142*   Lipid Profile: No results for input(s): CHOL, HDL, LDLCALC, TRIG, CHOLHDL, LDLDIRECT in the last 72 hours. Thyroid Function Tests: No results for input(s): TSH, T4TOTAL, FREET4, T3FREE, THYROIDAB in the last 72 hours. Anemia Panel: No results for input(s): VITAMINB12, FOLATE, FERRITIN, TIBC, IRON, RETICCTPCT in the last 72 hours. Urine analysis:    Component Value Date/Time   COLORURINE YELLOW 02/11/2019 1250   APPEARANCEUR CLEAR 02/11/2019 1250   LABSPEC 1.035 (H) 02/11/2019 1250   PHURINE 5.0 02/11/2019 1250   GLUCOSEU NEGATIVE 02/11/2019 1250   HGBUR SMALL (A) 02/11/2019 1250   BILIRUBINUR NEGATIVE 02/11/2019 1250   KETONESUR NEGATIVE 02/11/2019 1250   PROTEINUR 30 (A) 02/11/2019 1250   UROBILINOGEN 0.2 07/22/2013 0725   NITRITE NEGATIVE 02/11/2019 1250   LEUKOCYTESUR NEGATIVE 02/11/2019 1250    Radiological Exams on Admission: DG Chest Port 1 View  Result Date: 04/19/2020 CLINICAL DATA:  Shortness of breath EXAM: PORTABLE CHEST 1 VIEW COMPARISON:  04/10/2019, CT 02/11/2019 FINDINGS: Hyperinflation with emphysematous disease. No acute consolidation or pleural effusion. Probable scarring at the bases. Stable cardiomediastinal silhouette with aortic atherosclerosis. No pneumothorax. IMPRESSION: Hyperinflation with emphysematous disease and probable scarring at the bases. Electronically Signed   By: Donavan Foil M.D.   On: 04/19/2020 20:13    EKG: Independently reviewed.  Poor baseline however appears NSR at 130 bpm with frequent PVCs noted.  Assessment/Plan Active Problems:   Diabetes mellitus (HCC)   COPD (chronic obstructive pulmonary disease) (HCC)   Essential hypertension   Hypoxia   Elevated troponin   PVC's (premature ventricular contractions)   Acute respiratory disease due to COVID-19 virus   Pneumonia due to COVID-19 virus   #Acute hypoxic respiratory failure requiring 4 L nasal cannula #Stage D COPD #Covid  positive PCR -Patient reports being vaccinated 2 years ago. -CXR without clear-cut focal consolidation. -We will start on remdesivir along with Decadron.Will help COPD as well.  -Continue with vancomycin/cefepime. -Ferritin, CRP,  procalcitonin pending.  #  Troponinemia 50 with frequent PVCs on EKG. -We will consider serial cardiac enzymes. -Echo to assess wall motion.  Previous CT chest with mild to moderate coronary atherosclerosis. -Aspirin 81 and Lipitor 80.  #HTN -Continue with losartan  #DM -Appears diet controlled since patient not on any medication.  Will check A1c.  DVT prophylaxis: Subcu heparin  Code Status: Full code Family Communication: Spoke with daughter [name] at [phone]. Disposition Plan: Home.  Consults called: Critical care consulted from ED. Admission status: Inpatient going to stepdown.   Talicia Sui MD [facility]   If 7PM-7AM, please contact night-coverage [email or web address] Password [email or web address]  04/20/2020, 1:28 AM

## 2020-04-20 NOTE — Progress Notes (Signed)
PROGRESS NOTE    Cory Kemp  VHQ:[id number] DOB: 1941/05/30 DOA: 04/19/2020 PCP: Jani Gravel, MD    Brief Narrative:  Cory Kemp is a 79 year old male with past medical history notable for hypertension, type 2 diabetes mellitus, BPH, stage D COPD on 2 L nasal cannula at home, OSA and questionable dementia who presented to the ED with progressive shortness of breath and fever.  Patient apparently drove himself to the [facility], ED, however he cannot remove himself from the car.  Patient was noted to have increased respiratory rate of 35 with SPO2 79% on room air with a temperature of 104.3.  To BC count 3.3, potassium 3.4, creatinine 1.9, troponin 50, BNP 248.  Covid-19 PCR positive, lactic acid 1.2.  Chest x-ray with emphysema.  Patient was started on empiric antibiotics with vancomycin, cefepime, Flagyl and given 1 L LR bolus.  Hospitalist consulted for admission.   Assessment & Plan:   Active Problems:   Diabetes mellitus (HCC)   COPD (chronic obstructive pulmonary disease) (HCC)   Essential hypertension   Hypoxia   Elevated troponin   PVC's (premature ventricular contractions)   Acute respiratory disease due to COVID-19 virus   Pneumonia due to COVID-19 virus   Acute hypoxic respiratory failure secondary to acute Covid-19 viral pneumonia during the ongoing 2020 Covid 19 Pandemic - POA Patient presenting from home with progressive shortness of breath. Found to be hypoxic with SPO2 79%. --COVID test: + 04/19/2020 --CRP 16.9 --ddimer 1.97 --Remdesivir, plan 5-day course (Day #2/5) --Decadron 6 mg IV every 24 hours Day (#2/10) --prone for 2-3hrs every 12hrs if able --Continue supplemental oxygen, titrate to maintain SPO2 greater than 92% --Continue supportive care with albuterol MDI prn, vitamin C, zinc, Tylenol, antitussives (benzonatate/ Mucinex/Tussionex) --Follow CBC, CMP, D-dimer, ferritin, and CRP daily --Continue airborne/contact isolation precautions for 3 weeks from  the day of diagnosis  The treatment plan and use of medications and known side effects were discussed with patient/family. Some of the medications used are based on case reports/anecdotal data.  All other medications being used in the management of COVID-19 based on limited study data.  Complete risks and long-term side effects are unknown, however in the best clinical judgment they seem to be of some benefit.  Patient wanted to proceed with treatment options provided.  Community-acquired pneumonia Patient presenting with hypoxia, fever 104.3, tachypnea and confusion. Procalcitonin elevated at 3.65. Chest x-ray with hyperinflation consistent with emphysematous disease and scarring bases. Given his severe lung disease from underlying COPD concerns for secondary pneumonia especially given high fever. --Blood cultures x2: Pending --Continue empiric antibiotics with vancomycin, will discontinue if MRSA PCR negative --Continue cefepime --Supportive care, antipyretics --Monitor CBC daily  Elevated troponin Troponin 50> 44> 55> 55> 49; flat. EKG with sinus tachycardia, multiple PVCs, no apparent ST elevation/depression. Etiology likely secondary to demand ischemia from acute infectious processes as above versus decreased clearance from a renal function. TTE with EF 60 to 75%, moderate LVH, no evidence of MR, calcified aortic valve without regurgitation or evidence of stenosis, IVC dilated. --Currently chest pain-free --Continue to monitor on telemetry  COPD --continue home Daliresp 251mcg PO daily and Incruse Ellipta  Essential hypertension --Continue losartan 50 mg p.o. daily  Type 2 diabetes mellitus Diet controlled at home. Hemoglobin A1c 5.9, well controlled. --Insulin sliding scale for coverage while inpatient --CBGs qAC/HS  Depression --Continue Lexapro 10 mg p.o. nightly   DVT prophylaxis: Heparin Code Status: Full code Family Communication: Updated patient extensively at  bedside  Disposition Plan:  Status is: Inpatient  Remains inpatient appropriate because:Ongoing diagnostic testing needed not appropriate for outpatient work up, Unsafe d/c plan, IV treatments appropriate due to intensity of illness or inability to take PO and Inpatient level of care appropriate due to severity of illness   Dispo: The patient is from: Home              Anticipated d/c is to: To be determined              Anticipated d/c date is: 3 days              Patient currently is not medically stable to d/c.      Consultants:   None  Procedures:  Transthoracic echocardiogram IMPRESSIONS  1. Very limited echo with poor windows. LVEF assessed essentially with  the PLA views.  2. Left ventricular ejection fraction, by estimation, is 60 to 65%. The  left ventricle has normal function. Left ventricular endocardial border  not optimally defined to evaluate regional wall motion. There is moderate  left ventricular hypertrophy. Left  ventricular diastolic function could not be evaluated.  3. Right ventricular systolic function was not well visualized. The right  ventricular size is not well visualized. There is normal pulmonary artery  systolic pressure.  4. The mitral valve was not well visualized. No evidence of mitral valve  regurgitation.  5. The aortic valve is calcified. Aortic valve regurgitation is not  visualized. Mild to moderate aortic valve sclerosis/calcification is  present, without any evidence of aortic stenosis.  6. The inferior vena cava is dilated in size with >50% respiratory  variability, suggesting right atrial pressure of 8 mmHg.   Antimicrobials:   Vancomycin 9/12>>  Cefepime 9/12>>  Flagyl 9/12 - 9/12    Subjective: Patient seen and examined bedside, resting comfortably in bed.  Continues in ED holding area.  Fever improved.  Continues with shortness of breath, weakness, fatigue.  Overall breathing fairly improved since initial  presentation.  No other complaints or concerns at this time.  Denies chills/night sweats, no headache, no visual changes, no chest pain, no palpitations, no nausea/vomiting/diarrhea, no abdominal pain.  No acute events overnight per nursing staff.  Objective: Vitals:   04/20/20 1400 04/20/20 1430 04/20/20 1511 04/20/20 1530  BP: (!) 108/48 (!) 107/55 (!) 86/60 (!) 118/54  Pulse: 90 98 97 85  Resp: (!) 22 (!) 22 (!) 25 (!) 27  Temp:   98.6 F (37 C)   TempSrc:   Oral   SpO2: 93% 93% 93% 91%    Intake/Output Summary (Last 24 hours) at 04/20/2020 1618 Last data filed at 04/20/2020 1331 Gross per 24 hour  Intake 1897.76 ml  Output --  Net 1897.76 ml   There were no vitals filed for this visit.  Examination:  General exam: Appears calm and comfortable, elderly and slightly disheveled in appearance Respiratory system: Slightly decreased breath sounds bilateral bases, no wheezes/rhonchi, slightly increased respiratory rate, no accessory muscle use, on 2 L nasal cannula with SPO2 90%. Cardiovascular system: S1 & S2 heard, RRR. No JVD, murmurs, rubs, gallops or clicks. No pedal edema. Gastrointestinal system: Abdomen is nondistended, soft and nontender. No organomegaly or masses felt. Normal bowel sounds heard. Central nervous system: Alert and oriented. No focal neurological deficits. Extremities: Symmetric 5 x 5 power. Skin: No rashes, lesions or ulcers Psychiatry: Judgement and insight appear normal. Mood & affect appropriate.     Data Reviewed: I have personally reviewed following  labs and imaging studies  CBC: Recent Labs  Lab 04/19/20 1926 04/19/20 2021 04/20/20 0448  WBC 3.3*  --  7.5  NEUTROABS 1.8  --  5.5  HGB 10.9* 11.6* 11.1*  HCT 33.8* 34.0* 34.6*  MCV 85.6  --  86.1  PLT 111*  --  073*   Basic Metabolic Panel: Recent Labs  Lab 04/19/20 1926 04/19/20 2021 04/20/20 0448  NA 134* 133* 134*  K 3.4* 3.8 3.5  CL 99 95* 98  CO2 21*  --  25  GLUCOSE 150* 153*  114*  BUN 19 18 21   CREATININE 0.87 0.90 0.92  CALCIUM 8.7*  --  8.8*   GFR: CrCl cannot be calculated (Unknown ideal weight.). Liver Function Tests: Recent Labs  Lab 04/19/20 1926 04/20/20 0448  AST 30 27  ALT 20 17  ALKPHOS 64 57  BILITOT 0.7 0.4  PROT 6.5 6.4*  ALBUMIN 3.1* 2.9*   No results for input(s): LIPASE, AMYLASE in the last 168 hours. No results for input(s): AMMONIA in the last 168 hours. Coagulation Profile: Recent Labs  Lab 04/19/20 1926 04/20/20 0448  INR 1.1 1.1   Cardiac Enzymes: No results for input(s): CKTOTAL, CKMB, CKMBINDEX, TROPONINI in the last 168 hours. BNP (last 3 results) No results for input(s): PROBNP in the last 8760 hours. HbA1C: Recent Labs    04/20/20 0448  HGBA1C 5.9*   CBG: Recent Labs  Lab 04/20/20 0009 04/20/20 0626 04/20/20 0800 04/20/20 1158 04/20/20 1612  GLUCAP 142* 94 109* 114* 100*   Lipid Profile: No results for input(s): CHOL, HDL, LDLCALC, TRIG, CHOLHDL, LDLDIRECT in the last 72 hours. Thyroid Function Tests: No results for input(s): TSH, T4TOTAL, FREET4, T3FREE, THYROIDAB in the last 72 hours. Anemia Panel: Recent Labs    04/20/20 0005  FERRITIN 112   Sepsis Labs: Recent Labs  Lab 04/19/20 1926 04/20/20 0005  PROCALCITON  --  3.65  LATICACIDVEN 1.2  --     Recent Results (from the past 240 hour(s))  SARS Coronavirus 2 by RT PCR (hospital order, performed in [facility] hospital lab) Nasopharyngeal Nasopharyngeal Swab     Status: Abnormal   Collection Time: 04/19/20  7:50 PM   Specimen: Nasopharyngeal Swab  Result Value Ref Range Status   SARS Coronavirus 2 POSITIVE (A) NEGATIVE Final    Comment: RESULT CALLED TO, READ BACK BY AND VERIFIED WITH: TALKINGTON,J.,RN @ 2158 04/19/20 BY TURNER,S. (NOTE) SARS-CoV-2 target nucleic acids are DETECTED  SARS-CoV-2 RNA is generally detectable in upper respiratory specimens  during the acute phase of infection.  Positive results are indicative  of the  presence of the identified virus, but do not rule out bacterial infection or co-infection with other pathogens not detected by the test.  Clinical correlation with patient history and  other diagnostic information is necessary to determine patient infection status.  The expected result is negative.  Fact Sheet for Patients:   StrictlyIdeas.no   Fact Sheet for Healthcare Providers:   BankingDealers.co.za    This test is not yet approved or cleared by the [place] FDA and  has been authorized for detection and/or diagnosis of SARS-CoV-2 by FDA under an Emergency Use Authorization (EUA).  This EUA will remain in effect (meani ng this test can be used) for the duration of  the COVID-19 declaration under Section 564(b)(1) of the Act, 21 U.S.C. section 360-bbb-3(b)(1), unless the authorization is terminated or revoked sooner.  Performed at [facility], [address] [place], [place]  [address]          Radiology Studies: [facility] Chest Port 1 View  Result Date: 04/19/2020 CLINICAL DATA:  Shortness of breath EXAM: PORTABLE CHEST 1 VIEW COMPARISON:  04/10/2019, CT 02/11/2019 FINDINGS: Hyperinflation with emphysematous disease. No acute consolidation or pleural effusion. Probable scarring at the bases. Stable cardiomediastinal silhouette with aortic atherosclerosis. No pneumothorax. IMPRESSION: Hyperinflation with emphysematous disease and probable scarring at the bases. Electronically Signed   By: Donavan Foil M.D.   On: 04/19/2020 20:13        Scheduled Meds: . aspirin EC  81 mg Oral Daily  . atorvastatin  40 mg Oral Daily  . dexamethasone (DECADRON) injection  6 mg Intravenous QHS  . escitalopram  10 mg Oral QHS  . heparin  5,000 Units Subcutaneous Q8H  . insulin aspart  0-15 Units Subcutaneous Q4H  . insulin aspart  0-15 Units Subcutaneous TID WC  . losartan  50 mg Oral QHS  . umeclidinium bromide  1 puff  Inhalation Daily   Continuous Infusions: . ceFEPime (MAXIPIME) IV Stopped (04/20/20 1331)  . remdesivir 100 mg in NS 100 mL Stopped (04/20/20 1115)  . vancomycin 1,000 mg (04/20/20 1414)     LOS: 1 day    Time spent: 39 minutes spent on chart review, discussion with nursing staff, consultants, updating family and interview/physical exam; more than 50% of that time was spent in counseling and/or coordination of care.    Javeah Loeza J [place] DO [facility] Available via Epic secure chat 7am-7pm After these hours, please refer to coverage provider listed on [email or web address] 04/20/2020, 4:18 PM

## 2020-04-20 NOTE — ED Notes (Signed)
Pt son Way[place], updated on pt status and POC

## 2020-04-21 ENCOUNTER — Other Ambulatory Visit: Payer: Self-pay

## 2020-04-21 LAB — GLUCOSE, CAPILLARY
Glucose-Capillary: 111 mg/dL — ABNORMAL HIGH (ref 70–99)
Glucose-Capillary: 124 mg/dL — ABNORMAL HIGH (ref 70–99)
Glucose-Capillary: 94 mg/dL (ref 70–99)
Glucose-Capillary: 87 mg/dL (ref 70–99)
Glucose-Capillary: 82 mg/dL (ref 70–99)

## 2020-04-21 LAB — CBC WITH DIFFERENTIAL/PLATELET
Abs Immature Granulocytes: 0.23 10*3/uL — ABNORMAL HIGH (ref 0.00–0.07)
Basophils Absolute: 0 10*3/uL (ref 0.0–0.1)
Basophils Relative: 0 %
Eosinophils Absolute: 0 10*3/uL (ref 0.0–0.5)
Eosinophils Relative: 0 %
HCT: 36.9 % — ABNORMAL LOW (ref 39.0–52.0)
Hemoglobin: 11.7 g/dL — ABNORMAL LOW (ref 13.0–17.0)
Immature Granulocytes: 2 %
Lymphocytes Relative: 7 %
Lymphs Abs: 0.8 10*3/uL (ref 0.7–4.0)
MCH: 27.4 pg (ref 26.0–34.0)
MCHC: 31.7 g/dL (ref 30.0–36.0)
MCV: 86.4 fL (ref 80.0–100.0)
Monocytes Absolute: 0.8 10*3/uL (ref 0.1–1.0)
Monocytes Relative: 7 %
Neutro Abs: 9.8 10*3/uL — ABNORMAL HIGH (ref 1.7–7.7)
Neutrophils Relative %: 84 %
Platelets: 116 10*3/uL — ABNORMAL LOW (ref 150–400)
RBC: 4.27 MIL/uL (ref 4.22–5.81)
RDW: 14.5 % (ref 11.5–15.5)
WBC: 11.6 10*3/uL — ABNORMAL HIGH (ref 4.0–10.5)
nRBC: 0 % (ref 0.0–0.2)

## 2020-04-21 LAB — MAGNESIUM: Magnesium: 1.8 mg/dL (ref 1.7–2.4)

## 2020-04-21 LAB — COMPREHENSIVE METABOLIC PANEL
ALT: 16 U/L (ref 0–44)
AST: 30 U/L (ref 15–41)
Albumin: 2.7 g/dL — ABNORMAL LOW (ref 3.5–5.0)
Alkaline Phosphatase: 50 U/L (ref 38–126)
Anion gap: 11 (ref 5–15)
BUN: 41 mg/dL — ABNORMAL HIGH (ref 8–23)
CO2: 24 mmol/L (ref 22–32)
Calcium: 8.6 mg/dL — ABNORMAL LOW (ref 8.9–10.3)
Chloride: 95 mmol/L — ABNORMAL LOW (ref 98–111)
Creatinine, Ser: 1.13 mg/dL (ref 0.61–1.24)
GFR calc Af Amer: >60 mL/min (ref >60)
GFR calc non Af Amer: >60 mL/min (ref >60)
Glucose, Bld: 107 mg/dL — ABNORMAL HIGH (ref 70–99)
Potassium: 4 mmol/L (ref 3.5–5.1)
Sodium: 130 mmol/L — ABNORMAL LOW (ref 135–145)
Total Bilirubin: 0.5 mg/dL (ref 0.3–1.2)
Total Protein: 5.9 g/dL — ABNORMAL LOW (ref 6.5–8.1)

## 2020-04-21 LAB — PROTIME-INR
INR: 1.1 (ref 0.8–1.2)
Prothrombin Time: 13.6 seconds (ref 11.4–15.2)

## 2020-04-21 LAB — D-DIMER, QUANTITATIVE: D-Dimer, Quant: 1.68 ug/mL-FEU — ABNORMAL HIGH (ref 0.00–0.50)

## 2020-04-21 LAB — C-REACTIVE PROTEIN: CRP: 15.1 mg/dL — ABNORMAL HIGH (ref <1.0)

## 2020-04-21 LAB — MRSA PCR SCREENING: MRSA by PCR: NEGATIVE

## 2020-04-21 LAB — PROCALCITONIN: Procalcitonin: 5.37 ng/mL

## 2020-04-21 LAB — FERRITIN: Ferritin: 158 ng/mL (ref 24–336)

## 2020-04-21 MED ORDER — ORAL CARE MOUTH RINSE
15.0000 mL | Freq: Two times a day (BID) | OROMUCOSAL | Status: DC
  Administered 2020-04-21 – 2020-04-24 (×6): 15 mL via OROMUCOSAL

## 2020-04-21 MED ORDER — CHLORHEXIDINE GLUCONATE CLOTH 2 % EX PADS
6.0000 | MEDICATED_PAD | Freq: Every day | CUTANEOUS | Status: DC
  Administered 2020-04-21 – 2020-04-24 (×4): 6 via TOPICAL

## 2020-04-21 MED ORDER — AZITHROMYCIN 250 MG PO TABS
500.0000 mg | ORAL_TABLET | Freq: Every day | ORAL | Status: DC
  Administered 2020-04-21 – 2020-04-23 (×3): 500 mg via ORAL
  Filled 2020-04-21 (×3): qty 2

## 2020-04-21 MED ORDER — SODIUM CHLORIDE 0.9 % IV SOLN
1.0000 g | INTRAVENOUS | Status: DC
  Administered 2020-04-21 – 2020-04-23 (×3): 1 g via INTRAVENOUS
  Filled 2020-04-21 (×3): qty 10

## 2020-04-21 MED ORDER — SODIUM CHLORIDE 0.9 % IV BOLUS
1000.0000 mL | Freq: Once | INTRAVENOUS | Status: AC
  Administered 2020-04-21: 1000 mL via INTRAVENOUS

## 2020-04-21 NOTE — Progress Notes (Addendum)
PROGRESS NOTE    Cory Kemp  JIR:[id number] DOB: [birth year] DOA: 04/19/2020 PCP: Jani Gravel, MD    Brief Narrative:  Cory Kemp is a 79 year old male with past medical history notable for hypertension, type 2 diabetes mellitus, BPH, stage D COPD on 2 L nasal cannula at home, OSA and questionable dementia who presented to the ED with progressive shortness of breath and fever.  Patient apparently drove himself to the [facility], ED, however he cannot remove himself from the car.  Patient was noted to have increased respiratory rate of 35 with SPO2 79% on room air with a temperature of 104.3.  To BC count 3.3, potassium 3.4, creatinine 1.9, troponin 50, BNP 248.  Covid-19 PCR positive, lactic acid 1.2.  Chest x-ray with emphysema.  Patient was started on empiric antibiotics with vancomycin, cefepime, Flagyl and given 1 L LR bolus.  Hospitalist consulted for admission.   Assessment & Plan:   Active Problems:   Diabetes mellitus (HCC)   COPD (chronic obstructive pulmonary disease) (HCC)   Essential hypertension   Hypoxia   Elevated troponin   PVC's (premature ventricular contractions)   Acute respiratory disease due to COVID-19 virus   Pneumonia due to COVID-19 virus   Acute hypoxic respiratory failure secondary to acute Covid-19 viral pneumonia during the ongoing 2020 Covid 19 Pandemic - POA Patient presenting from home with progressive shortness of breath. Found to be hypoxic with SPO2 79%. --COVID test: + 04/19/2020 --CRP 16.9-->15.1 --ddimer 1.97-->1.68 --Remdesivir, plan 5-day course (Day #3/5) --Decadron 6 mg IV every 24 hours Day (#3/10) --prone for 2-3hrs every 12hrs if able --Continue supplemental oxygen, titrate to maintain SPO2 greater than 92%, supplemental oxygen now titrated from 4 L back to his home 2 L per nasal cannula --Continue supportive care with albuterol MDI prn, vitamin C, zinc, Tylenol, antitussives (benzonatate/Mucinex/Tussionex) --Follow CBC, CMP,  D-dimer, ferritin, and CRP daily --Continue airborne/contact isolation precautions for 3 weeks from the day of diagnosis  The treatment plan and use of medications and known side effects were discussed with patient/family. Some of the medications used are based on case reports/anecdotal data.  All other medications being used in the management of COVID-19 based on limited study data.  Complete risks and long-term side effects are unknown, however in the best clinical judgment they seem to be of some benefit.  Patient wanted to proceed with treatment options provided.  Community-acquired pneumonia Patient presenting with hypoxia, fever 104.3, tachypnea and confusion. Procalcitonin elevated at 3.65. Chest x-ray with hyperinflation consistent with emphysematous disease and scarring bases. Given his severe lung disease from underlying COPD concerns for secondary pneumonia especially given high fever. --Now afebrile past 24 hours --Blood cultures x2: No growth x1 day --MRSA PCR negative, discontinue vancomycin 9/14 --DC cefepime, start azithromycin and ceftriaxone --Supportive care, antipyretics --Monitor CBC daily  Elevated troponin Troponin 50>44>55>55>49; flat. EKG with sinus tachycardia, multiple PVCs, no apparent ST elevation/depression. Etiology likely secondary to demand ischemia from acute infectious processes as above versus decreased clearance from a renal function. TTE with EF 60 to 75%, moderate LVH, no evidence of MR, calcified aortic valve without regurgitation or evidence of stenosis, IVC dilated. --Currently chest pain-free --No concerning findings on telemetry during hospitalization, will discontinue telemetry when moves to med/surgical unit  COPD --continue home Daliresp 226mcg PO daily and Incruse Ellipta  Essential hypertension --Continue losartan 50 mg p.o. daily  Type 2 diabetes mellitus Diet controlled at home. Hemoglobin A1c 5.9, well controlled. --Insulin sliding scale  for  coverage while inpatient --CBGs qAC/HS  Depression --Continue Lexapro 10 mg p.o. nightly  Weakness, deconditioning, debility: --Pending PT/OT evaluation   DVT prophylaxis: Heparin Code Status: Full code Family Communication: Updated patient extensively at bedside, updated patients daughter [name] via telephone this am  Disposition Plan:  Status is: Inpatient  Remains inpatient appropriate because:Ongoing diagnostic testing needed not appropriate for outpatient work up, Unsafe d/c plan, IV treatments appropriate due to intensity of illness or inability to take PO and Inpatient level of care appropriate due to severity of illness   Dispo: The patient is from: Home              Anticipated d/c is to: To be determined, pending PT/OT eval, but likely home               Anticipated d/c date is: 2 days              Patient currently is not medically stable to d/c.      Consultants:   None  Procedures:  Transthoracic echocardiogram IMPRESSIONS  1. Very limited echo with poor windows. LVEF assessed essentially with  the PLA views.  2. Left ventricular ejection fraction, by estimation, is 60 to 65%. The  left ventricle has normal function. Left ventricular endocardial border  not optimally defined to evaluate regional wall motion. There is moderate  left ventricular hypertrophy. Left  ventricular diastolic function could not be evaluated.  3. Right ventricular systolic function was not well visualized. The right  ventricular size is not well visualized. There is normal pulmonary artery  systolic pressure.  4. The mitral valve was not well visualized. No evidence of mitral valve  regurgitation.  5. The aortic valve is calcified. Aortic valve regurgitation is not  visualized. Mild to moderate aortic valve sclerosis/calcification is  present, without any evidence of aortic stenosis.  6. The inferior vena cava is dilated in size with >50% respiratory  variability,  suggesting right atrial pressure of 8 mmHg.   Antimicrobials:   Vancomycin 9/12 - 9/14  Cefepime 9/12 - 9/14  Flagyl 9/12 - 9/12  Azithromycin 9/14>>  Ceftriaxone 9/14>>    Subjective: Patient seen and examined at bedside, resting comfortably.  Continues with mild shortness of breath at rest but much improved since yesterday.  His oxygen has now been weaned from 4 to 2 L per nasal cannula which is his normal home dose.  Continues with global weakness/fatigue.  No other complaints or concerns at this time.  Denies chills/night sweats, no headache, no visual changes, no chest pain, no palpitations, no nausea/vomiting/diarrhea, no abdominal pain.  No acute events overnight per nursing staff.  Objective: Vitals:   04/21/20 0600 04/21/20 0700 04/21/20 0800 04/21/20 0900  BP: (!) 127/42  126/66   Pulse: 77 79 72 73  Resp: 18 (!) 21 (!) 25 (!) 23  Temp:      TempSrc:      SpO2: 92% 94% 94% 94%  Weight:      Height:        Intake/Output Summary (Last 24 hours) at 04/21/2020 0959 Last data filed at 04/21/2020 0955 Gross per 24 hour  Intake 1980 ml  Output 300 ml  Net 1680 ml   Filed Weights   04/21/20 0035  Weight: 79.8 kg    Examination:  General exam: Appears calm and comfortable, elderly and slightly disheveled in appearance Respiratory system: Slightly decreased breath sounds bilateral bases, no wheezes/rhonchi, slightly increased respiratory rate, no accessory muscle use, on  2 L nasal cannula with SPO2 92%. Cardiovascular system: S1 & S2 heard, RRR. No JVD, murmurs, rubs, gallops or clicks. No pedal edema. Gastrointestinal system: Abdomen is nondistended, soft and nontender. No organomegaly or masses felt. Normal bowel sounds heard. Central nervous system: Alert and oriented. No focal neurological deficits. Extremities: Symmetric 5 x 5 power. Skin: No rashes, lesions or ulcers Psychiatry: Judgement and insight appear normal. Mood & affect appropriate.     Data  Reviewed: I have personally reviewed following labs and imaging studies  CBC: Recent Labs  Lab 04/19/20 1926 04/19/20 2021 04/20/20 0448 04/21/20 0246  WBC 3.3*  --  7.5 11.6*  NEUTROABS 1.8  --  5.5 9.8*  HGB 10.9* 11.6* 11.1* 11.7*  HCT 33.8* 34.0* 34.6* 36.9*  MCV 85.6  --  86.1 86.4  PLT 111*  --  108* 384*   Basic Metabolic Panel: Recent Labs  Lab 04/19/20 1926 04/19/20 2021 04/20/20 0448 04/21/20 0246  NA 134* 133* 134* 130*  K 3.4* 3.8 3.5 4.0  CL 99 95* 98 95*  CO2 21*  --  25 24  GLUCOSE 150* 153* 114* 107*  BUN 19 18 21  41*  CREATININE 0.87 0.90 0.92 1.13  CALCIUM 8.7*  --  8.8* 8.6*  MG  --   --   --  1.8   GFR: Estimated Creatinine Clearance: 59.8 mL/min (by C-G formula based on SCr of 1.13 mg/dL). Liver Function Tests: Recent Labs  Lab 04/19/20 1926 04/20/20 0448 04/21/20 0246  AST 30 27 30   ALT 20 17 16   ALKPHOS 64 57 50  BILITOT 0.7 0.4 0.5  PROT 6.5 6.4* 5.9*  ALBUMIN 3.1* 2.9* 2.7*   No results for input(s): LIPASE, AMYLASE in the last 168 hours. No results for input(s): AMMONIA in the last 168 hours. Coagulation Profile: Recent Labs  Lab 04/19/20 1926 04/20/20 0448 04/21/20 0246  INR 1.1 1.1 1.1   Cardiac Enzymes: No results for input(s): CKTOTAL, CKMB, CKMBINDEX, TROPONINI in the last 168 hours. BNP (last 3 results) No results for input(s): PROBNP in the last 8760 hours. HbA1C: Recent Labs    04/20/20 0448  HGBA1C 5.9*   CBG: Recent Labs  Lab 04/20/20 1612 04/20/20 1956 04/20/20 2318 04/21/20 0409 04/21/20 0748  GLUCAP 100* 101* 126* 111* 124*   Lipid Profile: No results for input(s): CHOL, HDL, LDLCALC, TRIG, CHOLHDL, LDLDIRECT in the last 72 hours. Thyroid Function Tests: No results for input(s): TSH, T4TOTAL, FREET4, T3FREE, THYROIDAB in the last 72 hours. Anemia Panel: Recent Labs    04/20/20 0005 04/21/20 0246  FERRITIN 112 158   Sepsis Labs: Recent Labs  Lab 04/19/20 1926 04/20/20 0005 04/21/20 0246   PROCALCITON  --  3.65 5.37  LATICACIDVEN 1.2  --   --     Recent Results (from the past 240 hour(s))  Blood Culture (routine x 2)     Status: None (Preliminary result)   Collection Time: 04/19/20  7:26 PM   Specimen: BLOOD RIGHT FOREARM  Result Value Ref Range Status   Specimen Description   Final    BLOOD RIGHT FOREARM Performed at [facility] [address], [place], [place] [address]    Special Requests   Final    BOTTLES DRAWN AEROBIC AND ANAEROBIC Blood Culture results may not be optimal due to an excessive volume of blood received in culture bottles Performed at [facility] [address], [place],  [address]    Culture   Final  NO GROWTH 1 DAY Performed at [facility], [place] [address], [place], [place] [address]    Report Status PENDING  Incomplete  Urine culture     Status: None   Collection Time: 04/19/20  7:26 PM   Specimen: In/Out Cath Urine  Result Value Ref Range Status   Specimen Description   Final    IN/OUT CATH URINE Performed at [facility] [address] [place], [place] [address]    Special Requests   Final    NONE Performed at [facility], [place] [address] [place], [place] [address]    Culture   Final    NO GROWTH Performed at [facility], [place] [address], [place], [place] [address]    Report Status 04/20/2020 FINAL  Final  SARS Coronavirus 2 by RT PCR (hospital order, performed in [facility] hospital lab) Nasopharyngeal Nasopharyngeal Swab     Status: Abnormal   Collection Time: 04/19/20  7:50 PM   Specimen: Nasopharyngeal Swab  Result Value Ref Range Status   SARS Coronavirus 2 POSITIVE (A) NEGATIVE Final    Comment: RESULT CALLED TO, READ BACK BY AND VERIFIED WITH: TALKINGTON,J.,RN @ 2158 04/19/20 BY TURNER,S. (NOTE) SARS-CoV-2 target nucleic acids are DETECTED  SARS-CoV-2 RNA is generally detectable in upper respiratory specimens  during  the acute phase of infection.  Positive results are indicative  of the presence of the identified virus, but do not rule out bacterial infection or co-infection with other pathogens not detected by the test.  Clinical correlation with patient history and  other diagnostic information is necessary to determine patient infection status.  The expected result is negative.  Fact Sheet for Patients:   StrictlyIdeas.no   Fact Sheet for Healthcare Providers:   BankingDealers.co.za    This test is not yet approved or cleared by the [place] FDA and  has been authorized for detection and/or diagnosis of SARS-CoV-2 by FDA under an Emergency Use Authorization (EUA).  This EUA will remain in effect (meani ng this test can be used) for the duration of  the COVID-19 declaration under Section 564(b)(1) of the Act, 21 U.S.C. section 360-bbb-3(b)(1), unless the authorization is terminated or revoked sooner.  Performed at [facility], [place] [address] [place], [place] [address]   Blood Culture (routine x 2)     Status: None (Preliminary result)   Collection Time: 04/19/20  8:45 PM   Specimen: BLOOD  Result Value Ref Range Status   Specimen Description   Final    BLOOD RIGHT ANTECUBITAL Performed at [facility] [address], [place], [place] [address]    Special Requests   Final    BOTTLES DRAWN AEROBIC AND ANAEROBIC Blood Culture adequate volume Performed at [facility] [address], [place], [place] [address]    Culture   Final    NO GROWTH 1 DAY Performed at [facility], [place] [address] [place], [place] [address]    Report Status PENDING  Incomplete  MRSA PCR Screening     Status: None   Collection Time: 04/21/20 12:38 AM   Specimen: Nasopharyngeal  Result Value Ref Range Status   MRSA by PCR NEGATIVE NEGATIVE Final    Comment:        The GeneXpert MRSA Assay  (FDA approved for NASAL specimens only), is one component of a comprehensive MRSA colonization surveillance program. It is not intended to diagnose MRSA infection nor to  guide or monitor treatment for MRSA infections. Performed at [facility], [place] [address] [place], [place] [address]          Radiology Studies: [facility] Chest Port 1 View  Result Date: 04/19/2020 CLINICAL DATA:  Shortness of breath EXAM: PORTABLE CHEST 1 VIEW COMPARISON:  04/10/2019, CT 02/11/2019 FINDINGS: Hyperinflation with emphysematous disease. No acute consolidation or pleural effusion. Probable scarring at the bases. Stable cardiomediastinal silhouette with aortic atherosclerosis. No pneumothorax. IMPRESSION: Hyperinflation with emphysematous disease and probable scarring at the bases. Electronically Signed   By: Donavan Foil M.D.   On: 04/19/2020 20:13   ECHOCARDIOGRAM LIMITED  Result Date: 04/20/2020    ECHOCARDIOGRAM REPORT   Patient Name:   KRISHNA DANCEL [facility] Date of Exam: 04/20/2020 Medical Rec #:  [id number]     Height:       73.0 in Accession #:    [id number]    Weight:       178.6 lb Date of Birth:  06/02/1941     BSA:          2.051 m Patient Age:    74 years      BP:           104/81 mmHg Patient Gender: M             HR:           93 bpm. Exam Location:  Inpatient Procedure: 2D Echo Indications:    Troponin Level Elevated  History:        Patient has prior history of Echocardiogram examinations, most                 recent 04/11/2016. COPD; Risk Factors:Hypertension, Dyslipidemia,                 Diabetes and Alcohol Abuse.  Sonographer:    Mikki Santee RDCS (AE) Referring Phys: [id number] [facility]  Sonographer Comments: Technically challenging study due to limited acoustic windows, no apical window and suboptimal subcostal window. Image acquisition challenging due to respiratory motion. IMPRESSIONS  1. Very limited echo with poor windows. LVEF assessed essentially with the PLA views.   2. Left ventricular ejection fraction, by estimation, is 60 to 65%. The left ventricle has normal function. Left ventricular endocardial border not optimally defined to evaluate regional wall motion. There is moderate left ventricular hypertrophy. Left ventricular diastolic function could not be evaluated.  3. Right ventricular systolic function was not well visualized. The right ventricular size is not well visualized. There is normal pulmonary artery systolic pressure.  4. The mitral valve was not well visualized. No evidence of mitral valve regurgitation.  5. The aortic valve is calcified. Aortic valve regurgitation is not visualized. Mild to moderate aortic valve sclerosis/calcification is present, without any evidence of aortic stenosis.  6. The inferior vena cava is dilated in size with >50% respiratory variability, suggesting right atrial pressure of 8 mmHg. FINDINGS  Left Ventricle: Left ventricular ejection fraction, by estimation, is 60 to 65%. The left ventricle has normal function. Left ventricular endocardial border not optimally defined to evaluate regional wall motion. The left ventricular internal cavity size was normal in size. There is moderate left ventricular hypertrophy. Left ventricular diastolic function could not be evaluated due to indeterminate diastolic function. Left ventricular diastolic function could not be evaluated. Right Ventricle: The right ventricular size is not well visualized. Right vetricular wall thickness was not assessed. Right ventricular systolic function was not well visualized. There is normal pulmonary  artery systolic pressure. The tricuspid regurgitant velocity is 2.42 m/s, and with an assumed right atrial pressure of 8 mmHg, the estimated right ventricular systolic pressure is 09.8 mmHg. Left Atrium: Left atrial size was not assessed. Right Atrium: Right atrial size was not assessed. Pericardium: There is no evidence of pericardial effusion. Mitral Valve: The mitral  valve was not well visualized. No evidence of mitral valve regurgitation. Tricuspid Valve: The tricuspid valve is grossly normal. Tricuspid valve regurgitation is trivial. Aortic Valve: The aortic valve is calcified. Aortic valve regurgitation is not visualized. Mild to moderate aortic valve sclerosis/calcification is present, without any evidence of aortic stenosis. Pulmonic Valve: The pulmonic valve was not assessed. Pulmonic valve regurgitation is not visualized. Aorta: The aortic root and ascending aorta are structurally normal, with no evidence of dilitation. Venous: The inferior vena cava is dilated in size with greater than 50% respiratory variability, suggesting right atrial pressure of 8 mmHg. IAS/Shunts: No atrial level shunt detected by color flow Doppler.  LEFT VENTRICLE PLAX 2D LVIDd:         4.16 cm LVIDs:         2.88 cm LV PW:         1.39 cm LV IVS:        1.36 cm LVOT diam:     2.40 cm LVOT Area:     4.52 cm  LEFT ATRIUM         Index LA diam:    4.30 cm 2.10 cm/m   AORTA Ao Root diam: 3.10 cm TRICUSPID VALVE TR Peak grad:   23.4 mmHg TR Vmax:        242.00 cm/s  SHUNTS Systemic Diam: 2.40 cm Lyman Bishop MD Electronically signed by Lyman Bishop MD Signature Date/Time: 04/20/2020/4:36:54 PM    Final         Scheduled Meds: . aspirin EC  81 mg Oral Daily  . atorvastatin  40 mg Oral Daily  . Chlorhexidine Gluconate Cloth  6 each Topical Daily  . dexamethasone (DECADRON) injection  6 mg Intravenous QHS  . escitalopram  10 mg Oral QHS  . heparin  5,000 Units Subcutaneous Q8H  . insulin aspart  0-15 Units Subcutaneous TID WC  . losartan  50 mg Oral QHS  . mouth rinse  15 mL Mouth Rinse BID  . roflumilast  250 mcg Oral Daily  . umeclidinium bromide  1 puff Inhalation Daily   Continuous Infusions: . ceFEPime (MAXIPIME) IV Stopped (04/21/20 0446)  . remdesivir 100 mg in NS 100 mL 100 mg (04/21/20 0945)     LOS: 2 days    Time spent: 37 minutes spent on chart review,  discussion with nursing staff, consultants, updating family and interview/physical exam; more than 50% of that time was spent in counseling and/or coordination of care.    Margene Cherian J [place] DO [facility] Available via Epic secure chat 7am-7pm After these hours, please refer to coverage provider listed on [email or web address] 04/21/2020, 9:59 AM

## 2020-04-21 NOTE — Evaluation (Signed)
Physical Therapy Evaluation Patient Details Name: Cory Kemp MRN: [id number] DOB: 19-Oct-1940 Today's Date: 04/21/2020   History of Present Illness  79 yo male admitted with COVID. Hx of COPD, DM, anemia, ETOH abuse, dementia, chronic pain  Clinical Impression  On eval, pt required Min assist for mobility. He was able to stand and pivot from bed to chair with 2HHA. O2 87% on 2L [place]. Pt tolerated activity fairly well. Will plan to follow and progress activity as tolerated.     Follow Up Recommendations Home health PT    Equipment Recommendations   (continuing to assess)    Recommendations for Other Services       Precautions / Restrictions Precautions Precautions: Fall Precaution Comments: O2 dep Restrictions Weight Bearing Restrictions: No      Mobility  Bed Mobility Overal bed mobility: Needs Assistance Bed Mobility: Supine to Sit     Supine to sit: HOB elevated;Min guard     General bed mobility comments: Min guard for safety. Cues for safety.  Transfers Overall transfer level: Needs assistance   Transfers: Sit to/from Stand;Stand Pivot Transfers Sit to Stand: Min assist Stand pivot transfers: Min assist       General transfer comment: Assist to power up, stabilize. Stand pivot, bed to chair. O2 87% on 2L with activity.  Ambulation/Gait             General Gait Details: NT on today.  Stairs            Wheelchair Mobility    Modified Rankin (Stroke Patients Only)       Balance Overall balance assessment: Needs assistance           Standing balance-Leahy Scale: Poor                               Pertinent Vitals/Pain Pain Assessment: No/denies pain    Home Living Family/patient expects to be discharged to:: Private residence Living Arrangements: Other (Comment) (roommate)             Home Equipment: None      Prior Function Level of Independence: Independent               Hand Dominance         Extremity/Trunk Assessment   Upper Extremity Assessment Upper Extremity Assessment: Overall WFL for tasks assessed    Lower Extremity Assessment Lower Extremity Assessment: Generalized weakness    Cervical / Trunk Assessment Cervical / Trunk Assessment: Normal  Communication   Communication: No difficulties  Cognition Arousal/Alertness: Awake/alert Behavior During Therapy: WFL for tasks assessed/performed Overall Cognitive Status: No family/caregiver present to determine baseline cognitive functioning                                        General Comments      Exercises     Assessment/Plan    PT Assessment Patient needs continued PT services  PT Problem List Decreased strength;Decreased mobility;Decreased balance;Decreased activity tolerance;Decreased knowledge of use of DME       PT Treatment Interventions DME instruction;Gait training;Therapeutic activities;Therapeutic exercise;Patient/family education;Balance training;Functional mobility training    PT Goals (Current goals can be found in the Care Plan section)  Acute Rehab PT Goals Patient Stated Goal: home soon PT Goal Formulation: With patient Time For Goal Achievement: 05/05/20 Potential to Achieve Goals: Good  Frequency Min 3X/week   Barriers to discharge Decreased caregiver support      Co-evaluation               AM-PAC PT "6 Clicks" Mobility  Outcome Measure Help needed turning from your back to your side while in a flat bed without using bedrails?: A Little Help needed moving from lying on your back to sitting on the side of a flat bed without using bedrails?: A Little Help needed moving to and from a bed to a chair (including a wheelchair)?: A Little Help needed standing up from a chair using your arms (e.g., wheelchair or bedside chair)?: A Little Help needed to walk in hospital room?: A Little Help needed climbing 3-5 steps with a railing? : A Little 6 Click Score:  18    End of Session Equipment Utilized During Treatment: Oxygen Activity Tolerance: Patient tolerated treatment well Patient left: in chair;with call bell/phone within reach;with chair alarm set   PT Visit Diagnosis: Difficulty in walking, not elsewhere classified (R26.2);Muscle weakness (generalized) (M62.81)    Time: 1325-1350 PT Time Calculation (min) (ACUTE ONLY): 25 min   Charges:   PT Evaluation $PT Eval Low Complexity: 1 Low PT Treatments $Gait Training: 8-22 mins         Doreatha Massed, PT Acute Rehabilitation  Office: [phone] Pager: [phone]

## 2020-04-22 ENCOUNTER — Other Ambulatory Visit: Payer: Self-pay

## 2020-04-22 DIAGNOSIS — J431 Panlobular emphysema: Secondary | ICD-10-CM

## 2020-04-22 LAB — COMPREHENSIVE METABOLIC PANEL
ALT: 18 U/L (ref 0–44)
AST: 34 U/L (ref 15–41)
Albumin: 2.8 g/dL — ABNORMAL LOW (ref 3.5–5.0)
Alkaline Phosphatase: 47 U/L (ref 38–126)
Anion gap: 8 (ref 5–15)
BUN: 38 mg/dL — ABNORMAL HIGH (ref 8–23)
CO2: 22 mmol/L (ref 22–32)
Calcium: 8.2 mg/dL — ABNORMAL LOW (ref 8.9–10.3)
Chloride: 106 mmol/L (ref 98–111)
Creatinine, Ser: 1.01 mg/dL (ref 0.61–1.24)
GFR calc Af Amer: >60 mL/min (ref >60)
GFR calc non Af Amer: >60 mL/min (ref >60)
Glucose, Bld: 141 mg/dL — ABNORMAL HIGH (ref 70–99)
Potassium: 4.3 mmol/L (ref 3.5–5.1)
Sodium: 136 mmol/L (ref 135–145)
Total Bilirubin: 0.5 mg/dL (ref 0.3–1.2)
Total Protein: 6.1 g/dL — ABNORMAL LOW (ref 6.5–8.1)

## 2020-04-22 LAB — GLUCOSE, CAPILLARY
Glucose-Capillary: 125 mg/dL — ABNORMAL HIGH (ref 70–99)
Glucose-Capillary: 153 mg/dL — ABNORMAL HIGH (ref 70–99)
Glucose-Capillary: 142 mg/dL — ABNORMAL HIGH (ref 70–99)

## 2020-04-22 LAB — D-DIMER, QUANTITATIVE: D-Dimer, Quant: 1.35 ug/mL-FEU — ABNORMAL HIGH (ref 0.00–0.50)

## 2020-04-22 LAB — CBC WITH DIFFERENTIAL/PLATELET
Abs Immature Granulocytes: 0.15 10*3/uL — ABNORMAL HIGH (ref 0.00–0.07)
Basophils Absolute: 0 10*3/uL (ref 0.0–0.1)
Basophils Relative: 0 %
Eosinophils Absolute: 0 10*3/uL (ref 0.0–0.5)
Eosinophils Relative: 1 %
HCT: 37.8 % — ABNORMAL LOW (ref 39.0–52.0)
Hemoglobin: 11.6 g/dL — ABNORMAL LOW (ref 13.0–17.0)
Immature Granulocytes: 3 %
Lymphocytes Relative: 13 %
Lymphs Abs: 0.6 10*3/uL — ABNORMAL LOW (ref 0.7–4.0)
MCH: 27.3 pg (ref 26.0–34.0)
MCHC: 30.7 g/dL (ref 30.0–36.0)
MCV: 88.9 fL (ref 80.0–100.0)
Monocytes Absolute: 0.3 10*3/uL (ref 0.1–1.0)
Monocytes Relative: 6 %
Neutro Abs: 3.5 10*3/uL (ref 1.7–7.7)
Neutrophils Relative %: 77 %
Platelets: 103 10*3/uL — ABNORMAL LOW (ref 150–400)
RBC: 4.25 MIL/uL (ref 4.22–5.81)
RDW: 14.6 % (ref 11.5–15.5)
WBC: 4.5 10*3/uL (ref 4.0–10.5)
nRBC: 0 % (ref 0.0–0.2)

## 2020-04-22 LAB — PHOSPHORUS: Phosphorus: 3.9 mg/dL (ref 2.5–4.6)

## 2020-04-22 LAB — MAGNESIUM: Magnesium: 2.1 mg/dL (ref 1.7–2.4)

## 2020-04-22 LAB — PROCALCITONIN
Procalcitonin: 3.02 ng/mL
Procalcitonin: 2.21 ng/mL

## 2020-04-22 LAB — FERRITIN: Ferritin: 278 ng/mL (ref 24–336)

## 2020-04-22 LAB — PROTIME-INR
INR: 1.1 (ref 0.8–1.2)
Prothrombin Time: 13.5 seconds (ref 11.4–15.2)

## 2020-04-22 LAB — C-REACTIVE PROTEIN: CRP: 8.5 mg/dL — ABNORMAL HIGH (ref <1.0)

## 2020-04-22 LAB — LACTIC ACID, PLASMA: Lactic Acid, Venous: 0.9 mmol/L (ref 0.5–1.9)

## 2020-04-22 MED ORDER — GUAIFENESIN-DM 100-10 MG/5ML PO SYRP
10.0000 mL | ORAL_SOLUTION | ORAL | Status: DC | PRN
  Administered 2020-04-22 – 2020-04-23 (×2): 10 mL via ORAL
  Filled 2020-04-22 (×2): qty 10

## 2020-04-22 MED ORDER — METHYLPREDNISOLONE SODIUM SUCC 40 MG IJ SOLR
40.0000 mg | Freq: Two times a day (BID) | INTRAMUSCULAR | Status: DC
  Administered 2020-04-22 – 2020-04-23 (×4): 40 mg via INTRAVENOUS
  Filled 2020-04-22 (×5): qty 1

## 2020-04-22 MED ORDER — HYDROCOD POLST-CPM POLST ER 10-8 MG/5ML PO SUER
5.0000 mL | Freq: Two times a day (BID) | ORAL | Status: DC | PRN
  Administered 2020-04-22 – 2020-04-23 (×2): 5 mL via ORAL
  Filled 2020-04-22 (×2): qty 5

## 2020-04-22 MED ORDER — ZINC SULFATE 220 (50 ZN) MG PO CAPS
220.0000 mg | ORAL_CAPSULE | Freq: Every day | ORAL | Status: DC
  Administered 2020-04-22 – 2020-04-24 (×3): 220 mg via ORAL
  Filled 2020-04-22 (×3): qty 1

## 2020-04-22 MED ORDER — ASCORBIC ACID 500 MG PO TABS
500.0000 mg | ORAL_TABLET | Freq: Every day | ORAL | Status: DC
  Administered 2020-04-22 – 2020-04-24 (×3): 500 mg via ORAL
  Filled 2020-04-22 (×3): qty 1

## 2020-04-22 NOTE — Evaluation (Signed)
Occupational Therapy Evaluation Patient Details Name: Cory Kemp MRN: [id number] DOB: 1941-06-03 Today's Date: 04/22/2020    History of Present Illness 79 yo male admitted with COVID. Hx of COPD, DM, anemia, ETOH abuse, dementia, chronic pain   Clinical Impression   Cory Kemp is a 79 year old man who is typically independent on 2 Liters of oxygen and reports he still works. On evaluation patient demonstrates normal ROM and strength of upper extremities, requires [place] of walker for standing and transferring with min guard, set up and min guard for ADLs, and decreased activity tolerance compared to his baseline. Patient will benefit from skilled OT services while in hospital to improve independence in order to return home at discharge. Patient's o2 sat and HR WFL during evaluation on 2L.     Follow Up Recommendations  No OT follow up    Equipment Recommendations  None recommended by OT    Recommendations for Other Services       Precautions / Restrictions Precautions Precautions: Fall Precaution Comments: O2 dep Restrictions Weight Bearing Restrictions: No      Mobility Bed Mobility Overal bed mobility: Needs Assistance Bed Mobility: Supine to Sit     Supine to sit: HOB elevated;Min guard        Transfers Overall transfer level: Needs assistance Equipment used: Rolling walker (2 wheeled) Transfers: Sit to/from [facility] Sit to Stand: Min guard Stand pivot transfers: Min guard       General transfer comment: Min guard to take steps to recliner with RW. Assistance for lines and leads and catheter.    Balance Overall balance assessment: Mild deficits observed, not formally tested                                         ADL either performed or assessed with clinical judgement   ADL Overall ADL's : Needs assistance/impaired Eating/Feeding: Independent   Grooming: Set up;Sitting   Upper Body Bathing: Set up;Sitting    Lower Body Bathing: Set up;Min guard;Sit to/from stand   Upper Body Dressing : Set up;Sitting   Lower Body Dressing: Set up;Sit to/from stand;Min guard Lower Body Dressing Details (indicate cue type and reason): Patient able to donn socks sitting at side of bed. Toilet Transfer: Runner, broadcasting/film/video and Hygiene: Min guard;Sit to/from stand               Vision   Vision Assessment?: No apparent visual deficits     Perception     Praxis      Pertinent Vitals/Pain Pain Assessment: No/denies pain     Hand Dominance Left   Extremity/Trunk Assessment Upper Extremity Assessment Upper Extremity Assessment: RUE deficits/detail;LUE deficits/detail RUE Deficits / Details: WFL ROM, 5/5 strength LUE Deficits / Details: WFL ROM, 5/5 strength           Communication Communication Communication: No difficulties   Cognition Arousal/Alertness: Awake/alert Behavior During Therapy: WFL for tasks assessed/performed Overall Cognitive Status: Within Functional Limits for tasks assessed                                     General Comments       Exercises     Shoulder Instructions      Home Living Family/patient expects to be discharged to:: Private  residence Living Arrangements: Other (Comment) (roommate) Available Help at Discharge: Friend(s);Available PRN/intermittently Type of Home: Mobile home Home Access: Stairs to enter Entrance Stairs-Number of Steps: 5 Entrance Stairs-Rails: Can reach both Home Layout: One level     Bathroom Shower/Tub: Teacher, early years/pre: Standard     Home Equipment: Cane - single point;Walker - 2 wheels   Additional Comments: wears oxygen at 2l      Prior Functioning/Environment Level of Independence: Independent                 OT Problem List: Decreased activity tolerance;Decreased knowledge of use of DME or AE;Cardiopulmonary status limiting activity       OT Treatment/Interventions: Self-care/ADL training;Therapeutic exercise;Energy conservation;DME and/or AE instruction;Therapeutic activities;Patient/family education    OT Goals(Current goals can be found in the care plan section) Acute Rehab OT Goals Patient Stated Goal: To go back to work OT Goal Formulation: With patient Time For Goal Achievement: 05/06/20 Potential to Achieve Goals: Good  OT Frequency:     Barriers to D/C: Decreased caregiver support          Co-evaluation              AM-PAC OT "6 Clicks" Daily Activity     Outcome Measure Help from another person eating meals?: None Help from another person taking care of personal grooming?: A Little Help from another person toileting, which includes using toliet, bedpan, or urinal?: A Little Help from another person bathing (including washing, rinsing, drying)?: A Little Help from another person to put on and taking off regular upper body clothing?: A Little Help from another person to put on and taking off regular lower body clothing?: A Little 6 Click Score: 19   End of Session Equipment Utilized During Treatment: Gait belt;Rolling walker;Oxygen Nurse Communication: Mobility status  Activity Tolerance: Patient tolerated treatment well Patient left: in chair;with call bell/phone within reach;with nursing/sitter in room  OT Visit Diagnosis: Muscle weakness (generalized) (M62.81)                Time: 2725-3664 OT Time Calculation (min): 27 min Charges:  OT General Charges $OT Visit: 1 Visit OT Evaluation $OT Eval Low Complexity: 1 Low  Kenyetta Fife, OTR/L [facility]  Office [phone] Pager: [place] 04/22/2020, 12:39 PM

## 2020-04-22 NOTE — Progress Notes (Signed)
PROGRESS NOTE    [name]  ZYY:[id number] DOB: 1940-12-20 DOA: 04/19/2020 PCP: Jani Gravel, MD   Brief Narrative:  Cory Kemp is a 79 year old WM PMHx hypertension, type 2 diabetes mellitus, BPH, stage D COPD on 2 L nasal cannula at home, OSA and questionable dementia   Presented to the ED with progressive shortness of breath and fever.  Patient apparently drove himself to the [facility], ED, however he cannot remove himself from the car.  Patient was noted to have increased respiratory rate of 35 with SPO2 79% on room air with a temperature of 104.3.  To BC count 3.3, potassium 3.4, creatinine 1.9, troponin 50, BNP 248.  Covid-19 PCR positive, lactic acid 1.2.  Chest x-ray with emphysema.  Patient was started on empiric antibiotics with vancomycin, cefepime, Flagyl and given 1 L LR bolus.  Hospitalist consulted for admission.   Subjective: Afebrile overnight, some periods of bradycardia   Assessment & Plan:  Covid vaccination;  Active Problems:   Diabetes mellitus (HCC)   COPD (chronic obstructive pulmonary disease) (HCC)   Essential hypertension   Hypoxia   Elevated troponin   PVC's (premature ventricular contractions)   Acute respiratory disease due to COVID-19 virus   Pneumonia due to COVID-19 virus  Acute respiratory failure with hypoxia/Covid 19 pneumonia COVID-19 Labs  Recent Labs    04/20/20 0005 04/20/20 0448 04/21/20 0246 04/22/20 0247  DDIMER  --  1.97* 1.68* 1.35*  FERRITIN 112  --  158 278  LDH 101  --   --   --   CRP 16.9*  --  15.1* 8.5*    Lab Results  Component Value Date   SARSCOV2NAA POSITIVE (A) 04/19/2020   [place] NEGATIVE 02/11/2019   [place] NEGATIVE 02/08/2019   [place] NEGATIVE 12/24/2018   Patient presenting from home with progressive shortness of breath. Found to be hypoxic with SPO2 79%. --Remdesivir, plan 5-day course (Day #3/5) --9/15 Decadron 6 mg IV every 24 hours Day----> Solu-Medrol 40 mg BID --prone for  2-3hrs every 12hrs if able --Continue supplemental oxygen, titrate to maintain SPO2 greater than 92%, supplemental oxygen now titrated from 4 L back to his home 2 L per nasal cannula --Continue supportive care with albuterol MDI prn, vitamin C, zinc, Tylenol, antitussives (benzonatate/Mucinex/Tussionex) --Follow CBC, CMP, D-dimer, ferritin, and CRP daily --Continue airborne/contact isolation precautions for 3 weeks from the day of diagnosis  The treatment plan and use of medications and known side effects were discussed with patient/family. Some of the medications used are based on case reports/anecdotal data.  All other medications being used in the management of COVID-19 based on limited study data.  Complete risks and long-term side effects are unknown, however in the best clinical judgment they seem to be of some benefit.  Patient wanted to proceed with treatment options provided.    Community-acquired pneumonia Patient presenting with hypoxia, fever 104.3, tachypnea and confusion. Procalcitonin elevated at 3.65. Chest x-ray with hyperinflation consistent with emphysematous disease and scarring bases. Given his severe lung disease from underlying COPD concerns for secondary pneumonia especially given high fever. --Now afebrile past 24 hours --Blood cultures x2: No growth x1 day --MRSA PCR negative, discontinue vancomycin 9/14 --DC cefepime, start azithromycin and ceftriaxone -Obtain procalcitonin/lactic acid which will guide antibiotic therapy -Complete 5 to 7 days antibiotics --Supportive care, antipyretics --Monitor CBC daily  COPD --continue home Daliresp 280mcg PO daily and Incruse Ellipta  Elevated troponin Results for [facility], HUDLOW (MRN [id number]) as of 04/22/2020 19:44  Ref. Range 04/19/2020 19:26 04/19/2020 22:53 04/20/2020 00:05 04/20/2020 04:48 04/20/2020 06:31  Troponin I (High Sensitivity) Latest Ref Range: <18 ng/L 50 (H) 44 (H) 55 (H) 55 (H) 49 (H)  -EKG was sinus  tachycardia, multiple PVCs.  No apparent ST elevation/depression -ST elevation minimal most consistent with demand ischemia -TTE with EF 60 to 75%, moderate LVH, no evidence of MR, calcified aortic valve without regurgitation or evidence of stenosis, IVC dilated. --Currently chest pain-free --No concerning findings on telemetry during hospitalization, will discontinue telemetry when moves to med/surgical unit  Essential hypertension --Continue losartan 50 mg p.o. daily  Type 2 diabetes mellitus Diet controlled at home. Hemoglobin A1c 5.9, well controlled. --Insulin sliding scale for coverage while inpatient --CBGs qAC/HS  Depression --Continue Lexapro 10 mg p.o. nightly  Weakness, deconditioning, debility: --Pending PT/OT evaluation   DVT prophylaxis: Heparin Code Status: Full Family Communication:  Status is: Inpatient    Dispo: The patient is from: Home              Anticipated d/c is to:               Anticipated d/c date is:??              Patient currently unstable      Consultants:    Procedures/Significant Events:     I have personally reviewed and interpreted all radiology studies and my findings are as above.  VENTILATOR SETTINGS: Nasal cannula 9/15 Flow; 2 L/min SPO2; 100%   Cultures   Antimicrobials: Anti-infectives (From admission, onward)   Start     Ordered Stop   04/21/20 1200  azithromycin (ZITHROMAX) tablet 500 mg        04/21/20 1006 04/26/20 0959   04/21/20 1200  cefTRIAXone (ROCEPHIN) 1 g in sodium chloride 0.9 % 100 mL IVPB        04/21/20 1006     04/20/20 1200  vancomycin (VANCOCIN) IVPB 1000 mg/200 mL premix  Status:  Discontinued        04/20/20 0141 04/21/20 0644   04/20/20 1000  remdesivir 100 mg in sodium chloride 0.9 % 100 mL IVPB       "Followed by" Linked Group Details   04/19/20 2359 04/24/20 0959   04/20/20 0400  ceFEPIme (MAXIPIME) 2 g in sodium chloride 0.9 % 100 mL IVPB  Status:  Discontinued        04/20/20  0141 04/21/20 1006   04/20/20 0000  remdesivir 200 mg in sodium chloride 0.9% 250 mL IVPB       "Followed by" Linked Group Details   04/19/20 2359 04/20/20 0147   04/19/20 2000  vancomycin (VANCOREADY) IVPB 1500 mg/300 mL        04/19/20 1929 04/19/20 2349   04/19/20 1930  ceFEPIme (MAXIPIME) 2 g in sodium chloride 0.9 % 100 mL IVPB        04/19/20 1926 04/19/20 2020   04/19/20 1930  metroNIDAZOLE (FLAGYL) IVPB 500 mg        04/19/20 1926 04/19/20 2118   04/19/20 1930  vancomycin (VANCOCIN) IVPB 1000 mg/200 mL premix  Status:  Discontinued        04/19/20 1926 04/19/20 1929       Devices    LINES / TUBES:      Continuous Infusions: . cefTRIAXone (ROCEPHIN)  IV Stopped (04/21/20 1204)  . remdesivir 100 mg in NS 100 mL Stopped (04/21/20 1015)     Objective: Vitals:   04/22/20 0400 04/22/20 0541 04/22/20  0700 04/22/20 0712  BP: (!) 160/55 (!) 159/78  (!) 142/54  Pulse: 60 76 (!) 57 (!) 55  Resp: (!) 26 17 (!) 21 20  Temp: 98.4 F (36.9 C)     TempSrc: Oral     SpO2: 100% 100% 100% 100%  Weight:      Height:        Intake/Output Summary (Last 24 hours) at 04/22/2020 0800 Last data filed at 04/22/2020 0540 Gross per 24 hour  Intake 1800.56 ml  Output 1400 ml  Net 400.56 ml   Filed Weights   04/21/20 0035  Weight: 79.8 kg    Examination:  General: A/O x4, positive acute respiratory distress Eyes: negative scleral hemorrhage, negative anisocoria, negative icterus ENT: Negative Runny nose, negative gingival bleeding, Neck:  Negative scars, masses, torticollis, lymphadenopathy, JVD Lungs: decreased breath sounds bilaterally without wheezes or crackles Cardiovascular: Regular rate and rhythm without murmur gallop or rub normal S1 and S2 Abdomen: negative abdominal pain, nondistended, positive soft, bowel sounds, no rebound, no ascites, no appreciable mass Extremities: No significant cyanosis, clubbing, or edema bilateral lower extremities Skin: Negative rashes,  lesions, ulcers Psychiatric:  Negative depression, negative anxiety, negative fatigue, negative mania  Central nervous system:  Cranial nerves II through XII intact, tongue/uvula midline, all extremities muscle strength 5/5, sensation intact throughout,negative dysarthria, negative expressive aphasia, negative receptive aphasia.  .     Data Reviewed: Care during the described time interval was provided by me .  I have reviewed this patient's available data, including medical history, events of note, physical examination, and all test results as part of my evaluation.   CBC: Recent Labs  Lab 04/19/20 1926 04/19/20 2021 04/20/20 0448 04/21/20 0246 04/22/20 0247  WBC 3.3*  --  7.5 11.6* 4.5  NEUTROABS 1.8  --  5.5 9.8* 3.5  HGB 10.9* 11.6* 11.1* 11.7* 11.6*  HCT 33.8* 34.0* 34.6* 36.9* 37.8*  MCV 85.6  --  86.1 86.4 88.9  PLT 111*  --  108* 116* 829*   Basic Metabolic Panel: Recent Labs  Lab 04/19/20 1926 04/19/20 2021 04/20/20 0448 04/21/20 0246 04/22/20 0247  NA 134* 133* 134* 130* 136  K 3.4* 3.8 3.5 4.0 4.3  CL 99 95* 98 95* 106  CO2 21*  --  25 24 22   GLUCOSE 150* 153* 114* 107* 141*  BUN 19 18 21  41* 38*  CREATININE 0.87 0.90 0.92 1.13 1.01  CALCIUM 8.7*  --  8.8* 8.6* 8.2*  MG  --   --   --  1.8  --    GFR: Estimated Creatinine Clearance: 66.9 mL/min (by C-G formula based on SCr of 1.01 mg/dL). Liver Function Tests: Recent Labs  Lab 04/19/20 1926 04/20/20 0448 04/21/20 0246 04/22/20 0247  AST 30 27 30  34  ALT 20 17 16 18   ALKPHOS 64 57 50 47  BILITOT 0.7 0.4 0.5 0.5  PROT 6.5 6.4* 5.9* 6.1*  ALBUMIN 3.1* 2.9* 2.7* 2.8*   No results for input(s): LIPASE, AMYLASE in the last 168 hours. No results for input(s): AMMONIA in the last 168 hours. Coagulation Profile: Recent Labs  Lab 04/19/20 1926 04/20/20 0448 04/21/20 0246 04/22/20 0247  INR 1.1 1.1 1.1 1.1   Cardiac Enzymes: No results for input(s): CKTOTAL, CKMB, CKMBINDEX, TROPONINI in the last  168 hours. BNP (last 3 results) No results for input(s): PROBNP in the last 8760 hours. HbA1C: Recent Labs    04/20/20 0448  HGBA1C 5.9*   CBG: Recent Labs  Lab  04/21/20 0409 04/21/20 0748 04/21/20 1125 04/21/20 1653 04/21/20 2101  GLUCAP 111* 124* 94 87 82   Lipid Profile: No results for input(s): CHOL, HDL, LDLCALC, TRIG, CHOLHDL, LDLDIRECT in the last 72 hours. Thyroid Function Tests: No results for input(s): TSH, T4TOTAL, FREET4, T3FREE, THYROIDAB in the last 72 hours. Anemia Panel: Recent Labs    04/21/20 0246 04/22/20 0247  FERRITIN 158 278   Urine analysis:    Component Value Date/Time   COLORURINE YELLOW 04/19/2020 1926   APPEARANCEUR CLEAR 04/19/2020 1926   LABSPEC 1.016 04/19/2020 1926   PHURINE 5.0 04/19/2020 1926   GLUCOSEU NEGATIVE 04/19/2020 1926   HGBUR LARGE (A) 04/19/2020 1926   BILIRUBINUR NEGATIVE 04/19/2020 1926   KETONESUR 5 (A) 04/19/2020 1926   PROTEINUR 100 (A) 04/19/2020 1926   UROBILINOGEN 0.2 07/22/2013 0725   NITRITE NEGATIVE 04/19/2020 1926   LEUKOCYTESUR NEGATIVE 04/19/2020 1926   Sepsis Labs: @LABRCNTIP (procalcitonin:4,lacticidven:4)  ) Recent Results (from the past 240 hour(s))  Blood Culture (routine x 2)     Status: None (Preliminary result)   Collection Time: 04/19/20  7:26 PM   Specimen: BLOOD RIGHT FOREARM  Result Value Ref Range Status   Specimen Description   Final    BLOOD RIGHT FOREARM Performed at [facility], [place] [address], [place], [place] [address]    Special Requests   Final    BOTTLES DRAWN AEROBIC AND ANAEROBIC Blood Culture results may not be optimal due to an excessive volume of blood received in culture bottles Performed at [place] [address], [place], [place] [address]    Culture   Final    NO GROWTH 1 DAY Performed at [facility], [place] [address] [place], [place] [address]    Report Status PENDING  Incomplete  Urine culture     Status: None     Collection Time: 04/19/20  7:26 PM   Specimen: In/Out Cath Urine  Result Value Ref Range Status   Specimen Description   Final    IN/OUT CATH URINE Performed at [facility] [address] [place], [place] [address]    Special Requests   Final    NONE Performed at [facility], [place] [address] [place], [place] [address]    Culture   Final    NO GROWTH Performed at [facility], [place] [address] [place], [place] [address]    Report Status 04/20/2020 FINAL  Final  SARS Coronavirus 2 by RT PCR (hospital order, performed in [facility] hospital lab) Nasopharyngeal Nasopharyngeal Swab     Status: Abnormal   Collection Time: 04/19/20  7:50 PM   Specimen: Nasopharyngeal Swab  Result Value Ref Range Status   SARS Coronavirus 2 POSITIVE (A) NEGATIVE Final    Comment: RESULT CALLED TO, READ BACK BY AND VERIFIED WITH: TALKINGTON,J.,RN @ 2158 04/19/20 BY TURNER,S. (NOTE) SARS-CoV-2 target nucleic acids are DETECTED  SARS-CoV-2 RNA is generally detectable in upper respiratory specimens  during the acute phase of infection.  Positive results are indicative  of the presence of the identified virus, but do not rule out bacterial infection or co-infection with other pathogens not detected by the test.  Clinical correlation with patient history and  other diagnostic information is necessary to determine patient infection status.  The expected result is negative.  Fact Sheet for Patients:   StrictlyIdeas.no   Fact Sheet for Healthcare Providers:   BankingDealers.co.za    This test is not yet  approved or cleared by the [place] and  has been authorized for detection and/or diagnosis of SARS-CoV-2 by FDA under an Emergency Use Authorization (EUA).  This EUA will remain in effect (meani ng this test can be used) for the duration of  the COVID-19 declaration under Section 564(b)(1) of the Act,  21 U.S.C. section 360-bbb-3(b)(1), unless the authorization is terminated or revoked sooner.  Performed at [facility], [place] [address] [place], [place] [address]   Blood Culture (routine x 2)     Status: None (Preliminary result)   Collection Time: 04/19/20  8:45 PM   Specimen: BLOOD  Result Value Ref Range Status   Specimen Description   Final    BLOOD RIGHT ANTECUBITAL Performed at [facility] [address], [place], [place] [address]    Special Requests   Final    BOTTLES DRAWN AEROBIC AND ANAEROBIC Blood Culture adequate volume Performed at [facility] [address], [place], [place] [address]    Culture   Final    NO GROWTH 1 DAY Performed at [facility], [place] [address] [place], [place] [address]    Report Status PENDING  Incomplete  MRSA PCR Screening     Status: None   Collection Time: 04/21/20 12:38 AM   Specimen: Nasopharyngeal  Result Value Ref Range Status   MRSA by PCR NEGATIVE NEGATIVE Final    Comment:        The GeneXpert MRSA Assay (FDA approved for NASAL specimens only), is one component of a comprehensive MRSA colonization surveillance program. It is not intended to diagnose MRSA infection nor to guide or monitor treatment for MRSA infections. Performed at [facility], [place] [address] [place], [place] [address]          Radiology Studies: ECHOCARDIOGRAM LIMITED  Result Date: 04/20/2020    ECHOCARDIOGRAM REPORT   Patient Name:   MANSEL STROTHER [facility] Date of Exam: 04/20/2020 Medical Rec #:  [id number]     Height:       73.0 in Accession #:    [id number]    Weight:       178.6 lb Date of Birth:  05-12-1941     BSA:          2.051 m Patient Age:    89 years      BP:           104/81 mmHg Patient Gender: M             HR:           93 bpm. Exam Location:  Inpatient Procedure: 2D Echo Indications:    Troponin Level Elevated  History:        Patient has prior history of  Echocardiogram examinations, most                 recent 04/11/2016. COPD; Risk Factors:Hypertension, Dyslipidemia,                 Diabetes and Alcohol Abuse.  Sonographer:    Mikki Santee RDCS (AE) Referring Phys: [id number] [facility]  Sonographer Comments: Technically challenging study due to limited acoustic windows, no apical window and suboptimal subcostal window. Image acquisition challenging due to respiratory motion. IMPRESSIONS  1. Very limited echo with poor windows. LVEF assessed essentially with the PLA views.  2. Left ventricular ejection fraction, by estimation, is 60 to 65%. The left ventricle has normal function. Left ventricular endocardial  border not optimally defined to evaluate regional wall motion. There is moderate left ventricular hypertrophy. Left ventricular diastolic function could not be evaluated.  3. Right ventricular systolic function was not well visualized. The right ventricular size is not well visualized. There is normal pulmonary artery systolic pressure.  4. The mitral valve was not well visualized. No evidence of mitral valve regurgitation.  5. The aortic valve is calcified. Aortic valve regurgitation is not visualized. Mild to moderate aortic valve sclerosis/calcification is present, without any evidence of aortic stenosis.  6. The inferior vena cava is dilated in size with >50% respiratory variability, suggesting right atrial pressure of 8 mmHg. FINDINGS  Left Ventricle: Left ventricular ejection fraction, by estimation, is 60 to 65%. The left ventricle has normal function. Left ventricular endocardial border not optimally defined to evaluate regional wall motion. The left ventricular internal cavity size was normal in size. There is moderate left ventricular hypertrophy. Left ventricular diastolic function could not be evaluated due to indeterminate diastolic function. Left ventricular diastolic function could not be evaluated. Right Ventricle: The right  ventricular size is not well visualized. Right vetricular wall thickness was not assessed. Right ventricular systolic function was not well visualized. There is normal pulmonary artery systolic pressure. The tricuspid regurgitant velocity is 2.42 m/s, and with an assumed right atrial pressure of 8 mmHg, the estimated right ventricular systolic pressure is 02.4 mmHg. Left Atrium: Left atrial size was not assessed. Right Atrium: Right atrial size was not assessed. Pericardium: There is no evidence of pericardial effusion. Mitral Valve: The mitral valve was not well visualized. No evidence of mitral valve regurgitation. Tricuspid Valve: The tricuspid valve is grossly normal. Tricuspid valve regurgitation is trivial. Aortic Valve: The aortic valve is calcified. Aortic valve regurgitation is not visualized. Mild to moderate aortic valve sclerosis/calcification is present, without any evidence of aortic stenosis. Pulmonic Valve: The pulmonic valve was not assessed. Pulmonic valve regurgitation is not visualized. Aorta: The aortic root and ascending aorta are structurally normal, with no evidence of dilitation. Venous: The inferior vena cava is dilated in size with greater than 50% respiratory variability, suggesting right atrial pressure of 8 mmHg. IAS/Shunts: No atrial level shunt detected by color flow Doppler.  LEFT VENTRICLE PLAX 2D LVIDd:         4.16 cm LVIDs:         2.88 cm LV PW:         1.39 cm LV IVS:        1.36 cm LVOT diam:     2.40 cm LVOT Area:     4.52 cm  LEFT ATRIUM         Index LA diam:    4.30 cm 2.10 cm/m   AORTA Ao Root diam: 3.10 cm TRICUSPID VALVE TR Peak grad:   23.4 mmHg TR Vmax:        242.00 cm/s  SHUNTS Systemic Diam: 2.40 cm Lyman Bishop MD Electronically signed by Lyman Bishop MD Signature Date/Time: 04/20/2020/4:36:54 PM    Final         Scheduled Meds: . aspirin EC  81 mg Oral Daily  . atorvastatin  40 mg Oral Daily  . azithromycin  500 mg Oral Daily  . Chlorhexidine  Gluconate Cloth  6 each Topical Daily  . dexamethasone (DECADRON) injection  6 mg Intravenous QHS  . escitalopram  10 mg Oral QHS  . heparin  5,000 Units Subcutaneous Q8H  . insulin aspart  0-15 Units Subcutaneous TID WC  .  losartan  50 mg Oral QHS  . mouth rinse  15 mL Mouth Rinse BID  . roflumilast  250 mcg Oral Daily  . umeclidinium bromide  1 puff Inhalation Daily   Continuous Infusions: . cefTRIAXone (ROCEPHIN)  IV Stopped (04/21/20 1204)  . remdesivir 100 mg in NS 100 mL Stopped (04/21/20 1015)     LOS: 3 days   The patient is critically ill with multiple organ systems failure and requires high complexity decision making for assessment and support, frequent evaluation and titration of therapies, application of advanced monitoring technologies and extensive interpretation of multiple databases. Critical Care Time devoted to patient care services described in this note  Time spent: 40 minutes     Ahmaya Ostermiller, Geraldo Docker, MD [facility] Pager [phone]  If 7PM-7AM, please contact night-coverage [email or web address] Password [email or web address] 04/22/2020, 8:00 AM

## 2020-04-22 NOTE — Progress Notes (Signed)
Report called to Iva Boop RN. All questions answered at this time. Pt transported with 2L O2 in wheelchair. Taken upstairs with all belongings and pt chart by this RN and NT. 5W will continue to care for pt at this time.

## 2020-04-22 NOTE — Progress Notes (Signed)
Flutter valve not given to pt at this time due to product on back order. RT management aware.

## 2020-04-22 NOTE — TOC Initial Note (Signed)
Transition of Care [facility]) - Initial/Assessment Note    Patient Details  Name: Cory Kemp MRN: [id number] Date of Birth: July 12, 1941  Transition of Care [facility]) CM/SW Contact:    Leeroy Cha, RN Phone Number: 04/22/2020, 8:01 AM  Clinical Narrative:                 s-stage d copd is a smoker, + covid unvaccinated, nasal cannulla 02 at 23l/min, lives alone . Has family near by p-follow for progression and toc needs.  Expected Discharge Plan: Home/Self Care Barriers to Discharge: Continued Medical Work up   Patient Goals and CMS Choice Patient states their goals for this hospitalization and ongoing recovery are:: to go home CMS Medicare.gov Compare Post Acute Care list provided to:: Patient    Expected Discharge Plan and Services Expected Discharge Plan: Home/Self Care   Discharge Planning Services: CM Consult   Living arrangements for the past 2 months: Apartment                                      Prior Living Arrangements/Services Living arrangements for the past 2 months: Apartment Lives with:: Self Patient language and need for interpreter reviewed:: Yes Do you feel safe going back to the place where you live?: Yes      Need for Family Participation in Patient Care: Yes (Comment) Care giver support system in place?: Yes (comment)   Criminal Activity/Legal Involvement Pertinent to Current Situation/Hospitalization: No - Comment as needed  Activities of Daily Living Home Assistive Devices/Equipment: Dentures (specify type), Eyeglasses, Nebulizer, Oxygen (upper denture) ADL Screening (condition at time of admission) Patient's cognitive ability adequate to safely complete daily activities?: No Is the patient deaf or have difficulty hearing?: Yes Does the patient have difficulty seeing, even when wearing glasses/contacts?: No Does the patient have difficulty concentrating, remembering, or making decisions?: Yes Patient able to express need for assistance  with ADLs?: Yes Does the patient have difficulty dressing or bathing?: Yes Independently performs ADLs?: No Communication: Independent Dressing (OT): Needs assistance Is this a change from baseline?: Change from baseline, expected to last >3 days Grooming: Needs assistance Is this a change from baseline?: Change from baseline, expected to last >3 days Feeding: Needs assistance Is this a change from baseline?: Change from baseline, expected to last >3 days Bathing: Needs assistance Is this a change from baseline?: Change from baseline, expected to last >3 days Toileting: Needs assistance Is this a change from baseline?: Change from baseline, expected to last >3days In/Out Bed: Needs assistance Is this a change from baseline?: Change from baseline, expected to last >3 days Walks in Home: Needs assistance Is this a change from baseline?: Change from baseline, expected to last >3 days Does the patient have difficulty walking or climbing stairs?: Yes (secondary to shortness of breath and weakness) Weakness of Legs: Both Weakness of Arms/Hands: None  Permission Sought/Granted                  Emotional Assessment Appearance:: Appears stated age Attitude/Demeanor/Rapport: Engaged Affect (typically observed): Calm Orientation: : Oriented to Place, Oriented to Self, Oriented to  Time, Oriented to Situation Alcohol / Substance Use: Tobacco Use Psych Involvement: No (comment)  Admission diagnosis:  Hypoxia [R09.02] Pneumonia due to COVID-19 virus [U07.1, J12.82] COVID-19 [U07.1] Patient Active Problem List   Diagnosis Date Noted  . Elevated troponin 04/20/2020  . PVC's (premature ventricular contractions) 04/20/2020  .  Acute respiratory disease due to COVID-19 virus 04/20/2020  . Pneumonia due to COVID-19 virus 04/20/2020  . Hypoxia 04/19/2020  . Medication management 03/05/2019  . Acute exacerbation of chronic obstructive pulmonary disease (COPD) ([place]) 02/11/2019  . COPD with  acute exacerbation ([place]) 02/08/2019  . Hypertensive urgency 02/08/2019  . CAP (community acquired pneumonia) 12/25/2018  . Acute on chronic respiratory failure with hypoxia ([place]) 12/25/2018  . Elevated d-dimer 12/25/2018  . Chronic respiratory failure with hypoxia ([place]) 12/13/2018  . Abnormal finding on EKG 01/03/2018  . OSA (obstructive sleep apnea) 06/14/2017  . Sinus tachycardia 04/11/2016  . Essential hypertension 04/11/2016  . COPD (chronic obstructive pulmonary disease) ([place]) 01/11/2016  . B12 deficiency 11/03/2013  . Leukopenia 01/29/2013  . Monocytosis 01/29/2013  . Normochromic anemia 01/29/2013  . Former smoker 03/27/2012  . Diabetes mellitus ([place]) 01/10/2012   PCP:  Jani Gravel, MD Pharmacy:   [facility] [place] [address] Phone: [phone] Fax: [phone]  [facility] [place], [place] - [address] [employer] [place] [address] Phone: [phone] Fax: [place], [place] - [address] [place] [address] Phone: [phone] Fax: [phone]     Social Determinants of Health (SDOH) Interventions    Readmission Risk Interventions No flowsheet data found.

## 2020-04-22 NOTE — Progress Notes (Signed)
Pt c/o increased cough and mucous congestion this AM. Dr. Sherral Hammers notified by this RN, and orders received for PRN cough medication. This RN will continue to closely monitor.

## 2020-04-23 LAB — LACTIC ACID, PLASMA: Lactic Acid, Venous: 0.6 mmol/L (ref 0.5–1.9)

## 2020-04-23 LAB — CBC WITH DIFFERENTIAL/PLATELET
Abs Immature Granulocytes: 0.15 10*3/uL — ABNORMAL HIGH (ref 0.00–0.07)
Basophils Absolute: 0 10*3/uL (ref 0.0–0.1)
Basophils Relative: 1 %
Eosinophils Absolute: 0 10*3/uL (ref 0.0–0.5)
Eosinophils Relative: 1 %
HCT: 35.7 % — ABNORMAL LOW (ref 39.0–52.0)
Hemoglobin: 11.2 g/dL — ABNORMAL LOW (ref 13.0–17.0)
Immature Granulocytes: 7 %
Lymphocytes Relative: 28 %
Lymphs Abs: 0.6 10*3/uL — ABNORMAL LOW (ref 0.7–4.0)
MCH: 27.3 pg (ref 26.0–34.0)
MCHC: 31.4 g/dL (ref 30.0–36.0)
MCV: 87.1 fL (ref 80.0–100.0)
Monocytes Absolute: 0.3 10*3/uL (ref 0.1–1.0)
Monocytes Relative: 15 %
Neutro Abs: 1.1 10*3/uL — ABNORMAL LOW (ref 1.7–7.7)
Neutrophils Relative %: 48 %
Platelets: 94 10*3/uL — ABNORMAL LOW (ref 150–400)
RBC: 4.1 MIL/uL — ABNORMAL LOW (ref 4.22–5.81)
RDW: 14.6 % (ref 11.5–15.5)
WBC: 2.2 10*3/uL — ABNORMAL LOW (ref 4.0–10.5)
nRBC: 0 % (ref 0.0–0.2)

## 2020-04-23 LAB — FERRITIN: Ferritin: 352 ng/mL — ABNORMAL HIGH (ref 24–336)

## 2020-04-23 LAB — PHOSPHORUS: Phosphorus: 3.1 mg/dL (ref 2.5–4.6)

## 2020-04-23 LAB — C-REACTIVE PROTEIN: CRP: 4.2 mg/dL — ABNORMAL HIGH (ref <1.0)

## 2020-04-23 LAB — D-DIMER, QUANTITATIVE: D-Dimer, Quant: 1.03 ug/mL-FEU — ABNORMAL HIGH (ref 0.00–0.50)

## 2020-04-23 LAB — COMPREHENSIVE METABOLIC PANEL
ALT: 24 U/L (ref 0–44)
AST: 58 U/L — ABNORMAL HIGH (ref 15–41)
Albumin: 2.6 g/dL — ABNORMAL LOW (ref 3.5–5.0)
Alkaline Phosphatase: 47 U/L (ref 38–126)
Anion gap: 8 (ref 5–15)
BUN: 41 mg/dL — ABNORMAL HIGH (ref 8–23)
CO2: 24 mmol/L (ref 22–32)
Calcium: 7.9 mg/dL — ABNORMAL LOW (ref 8.9–10.3)
Chloride: 103 mmol/L (ref 98–111)
Creatinine, Ser: 1.07 mg/dL (ref 0.61–1.24)
GFR calc Af Amer: >60 mL/min (ref >60)
GFR calc non Af Amer: >60 mL/min (ref >60)
Glucose, Bld: 136 mg/dL — ABNORMAL HIGH (ref 70–99)
Potassium: 4.2 mmol/L (ref 3.5–5.1)
Sodium: 135 mmol/L (ref 135–145)
Total Bilirubin: 0.5 mg/dL (ref 0.3–1.2)
Total Protein: 5.8 g/dL — ABNORMAL LOW (ref 6.5–8.1)

## 2020-04-23 LAB — PROCALCITONIN: Procalcitonin: 1.88 ng/mL

## 2020-04-23 LAB — GLUCOSE, CAPILLARY
Glucose-Capillary: 132 mg/dL — ABNORMAL HIGH (ref 70–99)
Glucose-Capillary: 197 mg/dL — ABNORMAL HIGH (ref 70–99)
Glucose-Capillary: 207 mg/dL — ABNORMAL HIGH (ref 70–99)
Glucose-Capillary: 159 mg/dL — ABNORMAL HIGH (ref 70–99)

## 2020-04-23 LAB — MAGNESIUM: Magnesium: 2.1 mg/dL (ref 1.7–2.4)

## 2020-04-23 LAB — LACTATE DEHYDROGENASE: LDH: 138 U/L (ref 98–192)

## 2020-04-23 NOTE — Progress Notes (Addendum)
PHYSICAL THERAPY  SATURATION QUALIFICATIONS: (This note is used to comply with regulatory documentation for home oxygen)  Patient Saturations on Room Air at Rest = 88%  Patient Saturations on Room Air while Ambulating only 7 feet = 84%  Patient Saturations on 2 Liters of oxygen while Ambulating = 90%  Please briefly explain why patient needs home oxygen:  Pt required supplemental oxygen to achieve therapeutic level Per chart review pt was on 2 lts at home prior for COPD  Rica Koyanagi  PTA [place] Pager      [phone] Office      [phone]

## 2020-04-23 NOTE — Care Management Important Message (Signed)
Important Message  Patient Details IM Letter given to the Case Manager to give to the RN to present to the Patient due to Covid Name: Cory Kemp MRN: [id number] Date of Birth: 20-Jul-1941   Medicare Important Message Given:  Yes     Kerin Salen 04/23/2020, 10:37 AM

## 2020-04-23 NOTE — Progress Notes (Signed)
Physical Therapy Treatment Patient Details Name: Cory Kemp MRN: [id number] DOB: 01-Jul-1941 Today's Date: 04/23/2020    History of Present Illness 79 yo male admitted with COVID. Hx of COPD, DM, anemia, ETOH abuse, dementia, chronic pain    PT Comments    Pt in bed on 2 lts sats 96%.  Assisted OOB to amb.  General transfer comment: self able with caution to lines.  Placed walker in front of him and he just pushed it to the side "I don't need that".General Gait Details: pt was able to amb around the room but only once due to mild dyspnea and MAX coughing.  Pt used suction twice to clear flem.  Very junky/moist cough.  Coughing wore him out more than the walking.  SATURATION QUALIFICATIONS: (This note is used to comply with regulatory documentation for home oxygen)  Patient Saturations on Room Air at Rest = 88%  Patient Saturations on Room Air while Ambulating only 7 feet = 84%  Patient Saturations on 2 Liters of oxygen while Ambulating = 90%  Please briefly explain why patient needs home oxygen:  Pt required supplemental oxygen to achieve therapeutic level Per chart review pt was on 2 lts at home prior for COPD   Follow Up Recommendations  Home health PT     Equipment Recommendations   (oxygen)    Recommendations for Other Services       Precautions / Restrictions Precautions Precautions: Fall Precaution Comments: Home O2 COPD 2 lts Restrictions Weight Bearing Restrictions: No    Mobility  Bed Mobility Overal bed mobility: Needs Assistance Bed Mobility: Supine to Sit     Supine to sit: Supervision     General bed mobility comments: increase time but self able  Transfers Overall transfer level: Needs assistance Equipment used: None Transfers: Sit to/from [facility] Sit to Stand: Supervision Stand pivot transfers: Supervision;Min guard       General transfer comment: self able with caution to lines.  Placed walker in front of him and  he just pushed it to the side "I don't need that".  Ambulation/Gait Ambulation/Gait assistance: Supervision Gait Distance (Feet): 12 Feet Assistive device: None Gait Pattern/deviations: Step-through pattern Gait velocity: decreased   General Gait Details: pt was able to amb around the room but only once due to mild dyspnea and MAX coughing.  Pt used suction twice to clear flem.  Very junky/moist cough.  Coughing wore him out more that the walking.   Stairs             Wheelchair Mobility    Modified Rankin (Stroke Patients Only)       Balance                                            Cognition Arousal/Alertness: Awake/alert Behavior During Therapy: WFL for tasks assessed/performed Overall Cognitive Status: Within Functional Limits for tasks assessed                                 General Comments: AxO x 3 very pleasant      Exercises      General Comments        Pertinent Vitals/Pain Pain Assessment: No/denies pain    Home Living  Prior Function            PT Goals (current goals can now be found in the care plan section) Progress towards PT goals: Progressing toward goals    Frequency    Min 3X/week      PT Plan Current plan remains appropriate    Co-evaluation              AM-PAC PT "6 Clicks" Mobility   Outcome Measure  Help needed turning from your back to your side while in a flat bed without using bedrails?: None Help needed moving from lying on your back to sitting on the side of a flat bed without using bedrails?: None Help needed moving to and from a bed to a chair (including a wheelchair)?: None Help needed standing up from a chair using your arms (e.g., wheelchair or bedside chair)?: None Help needed to walk in hospital room?: None Help needed climbing 3-5 steps with a railing? : A Little 6 Click Score: 23    End of Session Equipment Utilized During  Treatment: Oxygen;Gait belt Activity Tolerance: Patient tolerated treatment well Patient left: in chair;with call bell/phone within reach;with chair alarm set Nurse Communication: Mobility status PT Visit Diagnosis: Difficulty in walking, not elsewhere classified (R26.2);Muscle weakness (generalized) (M62.81)     Time: 4210-3128 PT Time Calculation (min) (ACUTE ONLY): 29 min  Charges:  $Gait Training: 8-22 mins $Therapeutic Activity: 8-22 mins                     Rica Koyanagi  PTA Acute  Rehabilitation Services Pager      [phone] Office      [phone]

## 2020-04-23 NOTE — Progress Notes (Signed)
PROGRESS NOTE    Cory Kemp  BJY:[id number] DOB: 1941/01/12 DOA: 04/19/2020 PCP: Jani Gravel, MD   Brief Narrative:  Cory Kemp is a 79 year old WM PMHx hypertension, type 2 diabetes mellitus, BPH, stage D COPD on 2 L nasal cannula at home, OSA and questionable dementia   Presented to the ED with progressive shortness of breath and fever.  Patient apparently drove himself to the [facility], ED, however he cannot remove himself from the car.  Patient was noted to have increased respiratory rate of 35 with SPO2 79% on room air with a temperature of 104.3.  To BC count 3.3, potassium 3.4, creatinine 1.9, troponin 50, BNP 248.  Covid-19 PCR positive, lactic acid 1.2.  Chest x-ray with emphysema.  Patient was started on empiric antibiotics with vancomycin, cefepime, Flagyl and given 1 L LR bolus.  Hospitalist consulted for admission.   Subjective: 9/16 A/O x4, positive chronic S OB.  Patient states has O2 at home.  Afebrile overnight   Assessment & Plan:  Covid vaccination;  Active Problems:   Diabetes mellitus (HCC)   COPD (chronic obstructive pulmonary disease) (HCC)   Essential hypertension   Hypoxia   Elevated troponin   PVC's (premature ventricular contractions)   Acute respiratory disease due to COVID-19 virus   Pneumonia due to COVID-19 virus  Acute respiratory failure with hypoxia/Covid 19 pneumonia COVID-19 Labs  Recent Labs    04/21/20 0246 04/22/20 0247 04/23/20 0219  DDIMER 1.68* 1.35* 1.03*  FERRITIN 158 278 352*  LDH  --   --  138  CRP 15.1* 8.5* 4.2*    Lab Results  Component Value Date   SARSCOV2NAA POSITIVE (A) 04/19/2020   [place] NEGATIVE 02/11/2019   [place] NEGATIVE 02/08/2019   [place] NEGATIVE 12/24/2018   Patient presenting from home with progressive shortness of breath. Found to be hypoxic with SPO2 79%. --Remdesivir, plan 5-day course  --9/15 Decadron 6 mg IV every 24 hours Day----> Solu-Medrol 40 mg BID --prone for 2-3hrs  every 12hrs if able --Continue supplemental oxygen, titrate to maintain SPO2> 88%  SATURATION QUALIFICATIONS: (This note is used to comply with regulatory documentation for home oxygen) Patient Saturations on Room Air at Rest = 88% Patient Saturations on Room Air while Ambulating only 7 feet = 84% Patient Saturations on 2 Liters of oxygen while Ambulating = 90% Please briefly explain why patient needs home oxygen:  Pt required supplemental oxygen to achieve therapeutic level -Patient meets criteria for home O2 -2 L O2 titrate to maintain SPO2> 88% -Provide Inogen home O2 portable concentrator   Community-acquired pneumonia Patient presenting with hypoxia, fever 104.3, tachypnea and confusion. Procalcitonin elevated at 3.65. Chest x-ray with hyperinflation consistent with emphysematous disease and scarring bases. Given his severe lung disease from underlying COPD concerns for secondary pneumonia especially given high fever. --Now afebrile past 24 hours --Blood cultures x2: No growth x1 day --MRSA PCR negative, discontinue vancomycin 9/14 --DC cefepime, start azithromycin and ceftriaxone -Obtain procalcitonin/lactic acid which will guide antibiotic therapy -Complete 5 days antibiotics --Supportive care, antipyretics --Monitor CBC daily  COPD --continue home Daliresp 257mcg PO daily and Incruse Ellipta  Elevated troponin Results for Cory, Kemp (MRN [id number]) as of 04/22/2020 19:44  Ref. Range 04/19/2020 19:26 04/19/2020 22:53 04/20/2020 00:05 04/20/2020 04:48 04/20/2020 06:31  Troponin I (High Sensitivity) Latest Ref Range: <18 ng/L 50 (H) 44 (H) 55 (H) 55 (H) 49 (H)  -EKG was sinus tachycardia, multiple PVCs.  No apparent ST elevation/depression -ST elevation minimal  most consistent with demand ischemia -TTE with EF 60 to 75%, moderate LVH, no evidence of MR, calcified aortic valve without regurgitation or evidence of stenosis, IVC dilated. --Currently chest pain-free --No  concerning findings on telemetry during hospitalization, will discontinue telemetry when moves to med/surgical unit  Essential hypertension --Continue losartan 50 mg p.o. daily  Type 2 diabetes mellitus Diet controlled at home. Hemoglobin A1c 5.9, well controlled. --Insulin sliding scale for coverage while inpatient --CBGs qAC/HS  Depression --Continue Lexapro 10 mg p.o. nightly  Weakness, deconditioning, debility: --Pending PT/OT evaluation   DVT prophylaxis: Heparin Code Status: Full Family Communication:  Status is: Inpatient    Dispo: The patient is from: Home              Anticipated d/c is to:               Anticipated d/c   9/17             Patient currently unstable      Consultants:    Procedures/Significant Events:     I have personally reviewed and interpreted all radiology studies and my findings are as above.  VENTILATOR SETTINGS: Nasal cannula 9/16 Flow; 2 L/min SPO2; 95%   Cultures   Antimicrobials: Anti-infectives (From admission, onward)   Start     Ordered Stop   04/21/20 1200  azithromycin (ZITHROMAX) tablet 500 mg        04/21/20 1006 04/26/20 0959   04/21/20 1200  cefTRIAXone (ROCEPHIN) 1 g in sodium chloride 0.9 % 100 mL IVPB        04/21/20 1006     04/20/20 1200  vancomycin (VANCOCIN) IVPB 1000 mg/200 mL premix  Status:  Discontinued        04/20/20 0141 04/21/20 0644   04/20/20 1000  remdesivir 100 mg in sodium chloride 0.9 % 100 mL IVPB       "Followed by" Linked Group Details   04/19/20 2359 04/24/20 0959   04/20/20 0400  ceFEPIme (MAXIPIME) 2 g in sodium chloride 0.9 % 100 mL IVPB  Status:  Discontinued        04/20/20 0141 04/21/20 1006   04/20/20 0000  remdesivir 200 mg in sodium chloride 0.9% 250 mL IVPB       "Followed by" Linked Group Details   04/19/20 2359 04/20/20 0147   04/19/20 2000  vancomycin (VANCOREADY) IVPB 1500 mg/300 mL        04/19/20 1929 04/19/20 2349   04/19/20 1930  ceFEPIme (MAXIPIME) 2 g in  sodium chloride 0.9 % 100 mL IVPB        04/19/20 1926 04/19/20 2020   04/19/20 1930  metroNIDAZOLE (FLAGYL) IVPB 500 mg        04/19/20 1926 04/19/20 2118   04/19/20 1930  vancomycin (VANCOCIN) IVPB 1000 mg/200 mL premix  Status:  Discontinued        04/19/20 1926 04/19/20 1929       Devices    LINES / TUBES:      Continuous Infusions: . cefTRIAXone (ROCEPHIN)  IV Stopped (04/23/20 1415)     Objective: Vitals:   04/22/20 2315 04/23/20 0529 04/23/20 1351 04/23/20 1943  BP: 139/70 (!) 160/64 138/62 (!) 150/73  Pulse: 67 60 64 65  Resp: 20 (!) 24 (!) 21 20  Temp: 98.3 F (36.8 C) 98.8 F (37.1 C) 97.7 F (36.5 C) 98.5 F (36.9 C)  TempSrc:   Oral   SpO2: 96% 98% 95% 97%  Weight:  Height:        Intake/Output Summary (Last 24 hours) at 04/23/2020 2050 Last data filed at 04/23/2020 1226 Gross per 24 hour  Intake 590 ml  Output --  Net 590 ml   Filed Weights   04/21/20 0035  Weight: 79.8 kg    Examination:  General: A/O x4, positive acute respiratory distress Eyes: negative scleral hemorrhage, negative anisocoria, negative icterus ENT: Negative Runny nose, negative gingival bleeding, Neck:  Negative scars, masses, torticollis, lymphadenopathy, JVD Lungs: decreased breath sounds bilaterally without wheezes or crackles Cardiovascular: Regular rate and rhythm without murmur gallop or rub normal S1 and S2 Abdomen: negative abdominal pain, nondistended, positive soft, bowel sounds, no rebound, no ascites, no appreciable mass Extremities: No significant cyanosis, clubbing, or edema bilateral lower extremities Skin: Negative rashes, lesions, ulcers Psychiatric:  Negative depression, negative anxiety, negative fatigue, negative mania  Central nervous system:  Cranial nerves II through XII intact, tongue/uvula midline, all extremities muscle strength 5/5, sensation intact throughout,negative dysarthria, negative expressive aphasia, negative receptive aphasia.  .      Data Reviewed: Care during the described time interval was provided by me .  I have reviewed this patient's available data, including medical history, events of note, physical examination, and all test results as part of my evaluation.   CBC: Recent Labs  Lab 04/19/20 1926 04/19/20 1926 04/19/20 2021 04/20/20 0448 04/21/20 0246 04/22/20 0247 04/23/20 0219  WBC 3.3*  --   --  7.5 11.6* 4.5 2.2*  NEUTROABS 1.8  --   --  5.5 9.8* 3.5 1.1*  HGB 10.9*   < > 11.6* 11.1* 11.7* 11.6* 11.2*  HCT 33.8*   < > 34.0* 34.6* 36.9* 37.8* 35.7*  MCV 85.6  --   --  86.1 86.4 88.9 87.1  PLT 111*  --   --  108* 116* 103* 94*   < > = values in this interval not displayed.   Basic Metabolic Panel: Recent Labs  Lab 04/19/20 1926 04/19/20 1926 04/19/20 2021 04/20/20 0448 04/21/20 0246 04/22/20 0247 04/23/20 0219  NA 134*   < > 133* 134* 130* 136 135  K 3.4*   < > 3.8 3.5 4.0 4.3 4.2  CL 99   < > 95* 98 95* 106 103  CO2 21*  --   --  25 24 22 24   GLUCOSE 150*   < > 153* 114* 107* 141* 136*  BUN 19   < > 18 21 41* 38* 41*  CREATININE 0.87   < > 0.90 0.92 1.13 1.01 1.07  CALCIUM 8.7*  --   --  8.8* 8.6* 8.2* 7.9*  MG  --   --   --   --  1.8 2.1 2.1  PHOS  --   --   --   --   --  3.9 3.1   < > = values in this interval not displayed.   GFR: Estimated Creatinine Clearance: 63.2 mL/min (by C-G formula based on SCr of 1.07 mg/dL). Liver Function Tests: Recent Labs  Lab 04/19/20 1926 04/20/20 0448 04/21/20 0246 04/22/20 0247 04/23/20 0219  AST 30 27 30  34 58*  ALT 20 17 16 18 24   ALKPHOS 64 57 50 47 47  BILITOT 0.7 0.4 0.5 0.5 0.5  PROT 6.5 6.4* 5.9* 6.1* 5.8*  ALBUMIN 3.1* 2.9* 2.7* 2.8* 2.6*   No results for input(s): LIPASE, AMYLASE in the last 168 hours. No results for input(s): AMMONIA in the last 168 hours. Coagulation Profile: Recent Labs  Lab 04/19/20 1926 04/20/20 0448 04/21/20 0246 04/22/20 0247  INR 1.1 1.1 1.1 1.1   Cardiac Enzymes: No results for input(s):  CKTOTAL, CKMB, CKMBINDEX, TROPONINI in the last 168 hours. BNP (last 3 results) No results for input(s): PROBNP in the last 8760 hours. HbA1C: No results for input(s): HGBA1C in the last 72 hours. CBG: Recent Labs  Lab 04/22/20 1630 04/23/20 0806 04/23/20 1151 04/23/20 1646 04/23/20 1945  GLUCAP 142* 132* 197* 207* 159*   Lipid Profile: No results for input(s): CHOL, HDL, LDLCALC, TRIG, CHOLHDL, LDLDIRECT in the last 72 hours. Thyroid Function Tests: No results for input(s): TSH, T4TOTAL, FREET4, T3FREE, THYROIDAB in the last 72 hours. Anemia Panel: Recent Labs    04/22/20 0247 04/23/20 0219  FERRITIN 278 352*   Urine analysis:    Component Value Date/Time   COLORURINE YELLOW 04/19/2020 1926   APPEARANCEUR CLEAR 04/19/2020 1926   LABSPEC 1.016 04/19/2020 1926   PHURINE 5.0 04/19/2020 1926   GLUCOSEU NEGATIVE 04/19/2020 1926   HGBUR LARGE (A) 04/19/2020 1926   BILIRUBINUR NEGATIVE 04/19/2020 1926   KETONESUR 5 (A) 04/19/2020 1926   PROTEINUR 100 (A) 04/19/2020 1926   UROBILINOGEN 0.2 07/22/2013 0725   NITRITE NEGATIVE 04/19/2020 1926   LEUKOCYTESUR NEGATIVE 04/19/2020 1926   Sepsis Labs: @LABRCNTIP (procalcitonin:4,lacticidven:4)  ) Recent Results (from the past 240 hour(s))  Blood Culture (routine x 2)     Status: None (Preliminary result)   Collection Time: 04/19/20  7:26 PM   Specimen: BLOOD RIGHT FOREARM  Result Value Ref Range Status   Specimen Description   Final    BLOOD RIGHT FOREARM Performed at [facility], [place] [address] [place], [place] [address]    Special Requests   Final    BOTTLES DRAWN AEROBIC AND ANAEROBIC Blood Culture results may not be optimal due to an excessive volume of blood received in culture bottles Performed at [place] [address] [place], [place] [address]    Culture   Final    NO GROWTH 3 DAYS Performed at [facility], [place] [address], [place], [place] [address]     Report Status PENDING  Incomplete  Urine culture     Status: None   Collection Time: 04/19/20  7:26 PM   Specimen: In/Out Cath Urine  Result Value Ref Range Status   Specimen Description   Final    IN/OUT CATH URINE Performed at [place] [address] [place], [place] [address]    Special Requests   Final    NONE Performed at [facility], [place] [address], [place], [place] [address]    Culture   Final    NO GROWTH Performed at [facility], [place] [address], [place], [place] [address]    Report Status 04/20/2020 FINAL  Final  SARS Coronavirus 2 by RT PCR (hospital order, performed in [facility] hospital lab) Nasopharyngeal Nasopharyngeal Swab     Status: Abnormal   Collection Time: 04/19/20  7:50 PM   Specimen: Nasopharyngeal Swab  Result Value Ref Range Status   SARS Coronavirus 2 POSITIVE (A) NEGATIVE Final    Comment: RESULT CALLED TO, READ BACK BY AND VERIFIED WITH: TALKINGTON,J.,RN @ 2158 04/19/20 BY TURNER,S. (NOTE) SARS-CoV-2 target nucleic acids are DETECTED  SARS-CoV-2 RNA is generally detectable in upper respiratory specimens  during the acute phase of infection.  Positive results are indicative  of the presence of the identified virus, but do not rule out  bacterial infection or co-infection with other pathogens not detected by the test.  Clinical correlation with patient history and  other diagnostic information is necessary to determine patient infection status.  The expected result is negative.  Fact Sheet for Patients:   StrictlyIdeas.no   Fact Sheet for Healthcare Providers:   BankingDealers.co.za    This test is not yet approved or cleared by the [place] FDA and  has been authorized for detection and/or diagnosis of SARS-CoV-2 by FDA under an Emergency Use Authorization (EUA).  This EUA will remain in effect (meani ng this test can be used) for the duration  of  the COVID-19 declaration under Section 564(b)(1) of the Act, 21 U.S.C. section 360-bbb-3(b)(1), unless the authorization is terminated or revoked sooner.  Performed at [facility], [place] [address], [place], [place] [address]   Blood Culture (routine x 2)     Status: None (Preliminary result)   Collection Time: 04/19/20  8:45 PM   Specimen: BLOOD  Result Value Ref Range Status   Specimen Description   Final    BLOOD RIGHT ANTECUBITAL Performed at [place] [address] [place], [place] [address]    Special Requests   Final    BOTTLES DRAWN AEROBIC AND ANAEROBIC Blood Culture adequate volume Performed at [place] [address] [place], [place] [address]    Culture   Final    NO GROWTH 3 DAYS Performed at [facility], [place] [address] [place], [place] [address]    Report Status PENDING  Incomplete  MRSA PCR Screening     Status: None   Collection Time: 04/21/20 12:38 AM   Specimen: Nasopharyngeal  Result Value Ref Range Status   MRSA by PCR NEGATIVE NEGATIVE Final    Comment:        The GeneXpert MRSA Assay (FDA approved for NASAL specimens only), is one component of a comprehensive MRSA colonization surveillance program. It is not intended to diagnose MRSA infection nor to guide or monitor treatment for MRSA infections. Performed at [facility], [place] [address], [place],  [address]          Radiology Studies: No results found.      Scheduled Meds: . vitamin C  500 mg Oral Daily  . aspirin EC  81 mg Oral Daily  . atorvastatin  40 mg Oral Daily  . azithromycin  500 mg Oral Daily  . Chlorhexidine Gluconate Cloth  6 each Topical Daily  . escitalopram  10 mg Oral QHS  . heparin  5,000 Units Subcutaneous Q8H  . insulin aspart  0-15 Units Subcutaneous TID WC  . losartan  50 mg Oral QHS  . mouth rinse  15 mL Mouth Rinse BID  . methylPREDNISolone  (SOLU-MEDROL) injection  40 mg Intravenous Q12H  . roflumilast  250 mcg Oral Daily  . umeclidinium bromide  1 puff Inhalation Daily  . zinc sulfate  220 mg Oral Daily   Continuous Infusions: . cefTRIAXone (ROCEPHIN)  IV Stopped (04/23/20 1415)     LOS: 4 days   The patient is critically ill with multiple organ systems failure and requires high complexity decision making for assessment and support, frequent evaluation and titration of therapies, application of advanced monitoring technologies and extensive interpretation of multiple databases. Critical Care Time devoted to patient care services described in this note  Time spent: 40 minutes     Hjalmer Iovino, Geraldo Docker, MD [facility] Pager [phone]  If 7PM-7AM, please contact night-coverage [email or web address] Password [facility] 04/23/2020, 8:50 PM

## 2020-04-24 DIAGNOSIS — J9621 Acute and chronic respiratory failure with hypoxia: Secondary | ICD-10-CM

## 2020-04-24 LAB — PHOSPHORUS: Phosphorus: 2.5 mg/dL (ref 2.5–4.6)

## 2020-04-24 LAB — CBC WITH DIFFERENTIAL/PLATELET
Abs Immature Granulocytes: 0.11 10*3/uL — ABNORMAL HIGH (ref 0.00–0.07)
Basophils Absolute: 0 10*3/uL (ref 0.0–0.1)
Basophils Relative: 0 %
Eosinophils Absolute: 0 10*3/uL (ref 0.0–0.5)
Eosinophils Relative: 0 %
HCT: 37.7 % — ABNORMAL LOW (ref 39.0–52.0)
Hemoglobin: 11.4 g/dL — ABNORMAL LOW (ref 13.0–17.0)
Immature Granulocytes: 4 %
Lymphocytes Relative: 24 %
Lymphs Abs: 0.6 10*3/uL — ABNORMAL LOW (ref 0.7–4.0)
MCH: 27 pg (ref 26.0–34.0)
MCHC: 30.2 g/dL (ref 30.0–36.0)
MCV: 89.3 fL (ref 80.0–100.0)
Monocytes Absolute: 0.4 10*3/uL (ref 0.1–1.0)
Monocytes Relative: 14 %
Neutro Abs: 1.5 10*3/uL — ABNORMAL LOW (ref 1.7–7.7)
Neutrophils Relative %: 58 %
Platelets: 112 10*3/uL — ABNORMAL LOW (ref 150–400)
RBC: 4.22 MIL/uL (ref 4.22–5.81)
RDW: 14.6 % (ref 11.5–15.5)
WBC: 2.6 10*3/uL — ABNORMAL LOW (ref 4.0–10.5)
nRBC: 0 % (ref 0.0–0.2)

## 2020-04-24 LAB — COMPREHENSIVE METABOLIC PANEL
ALT: 35 U/L (ref 0–44)
AST: 65 U/L — ABNORMAL HIGH (ref 15–41)
Albumin: 2.6 g/dL — ABNORMAL LOW (ref 3.5–5.0)
Alkaline Phosphatase: 49 U/L (ref 38–126)
Anion gap: 10 (ref 5–15)
BUN: 34 mg/dL — ABNORMAL HIGH (ref 8–23)
CO2: 24 mmol/L (ref 22–32)
Calcium: 8.3 mg/dL — ABNORMAL LOW (ref 8.9–10.3)
Chloride: 103 mmol/L (ref 98–111)
Creatinine, Ser: 0.95 mg/dL (ref 0.61–1.24)
GFR calc Af Amer: >60 mL/min (ref >60)
GFR calc non Af Amer: >60 mL/min (ref >60)
Glucose, Bld: 241 mg/dL — ABNORMAL HIGH (ref 70–99)
Potassium: 4.9 mmol/L (ref 3.5–5.1)
Sodium: 137 mmol/L (ref 135–145)
Total Bilirubin: 0.3 mg/dL (ref 0.3–1.2)
Total Protein: 5.6 g/dL — ABNORMAL LOW (ref 6.5–8.1)

## 2020-04-24 LAB — GLUCOSE, CAPILLARY
Glucose-Capillary: 154 mg/dL — ABNORMAL HIGH (ref 70–99)
Glucose-Capillary: 146 mg/dL — ABNORMAL HIGH (ref 70–99)
Glucose-Capillary: 109 mg/dL — ABNORMAL HIGH (ref 70–99)
Glucose-Capillary: 111 mg/dL — ABNORMAL HIGH (ref 70–99)

## 2020-04-24 LAB — MAGNESIUM: Magnesium: 2.2 mg/dL (ref 1.7–2.4)

## 2020-04-24 LAB — LACTATE DEHYDROGENASE: LDH: 139 U/L (ref 98–192)

## 2020-04-24 LAB — PROCALCITONIN: Procalcitonin: 0.4 ng/mL

## 2020-04-24 LAB — D-DIMER, QUANTITATIVE: D-Dimer, Quant: 1.1 ug/mL-FEU — ABNORMAL HIGH (ref 0.00–0.50)

## 2020-04-24 LAB — FERRITIN: Ferritin: 368 ng/mL — ABNORMAL HIGH (ref 24–336)

## 2020-04-24 LAB — C-REACTIVE PROTEIN: CRP: 1.9 mg/dL — ABNORMAL HIGH (ref <1.0)

## 2020-04-24 MED ORDER — ATORVASTATIN CALCIUM 40 MG PO TABS
40.0000 mg | ORAL_TABLET | Freq: Every day | ORAL | 0 refills | Status: DC
Start: 2020-04-25 — End: 2021-07-12

## 2020-04-24 MED ORDER — LOSARTAN POTASSIUM 25 MG PO TABS
75.0000 mg | ORAL_TABLET | Freq: Every day | ORAL | 0 refills | Status: DC
Start: 2020-04-24 — End: 2021-07-12

## 2020-04-24 MED ORDER — GUAIFENESIN-DM 100-10 MG/5ML PO SYRP: 10.0000 mL | ORAL_SOLUTION | ORAL | 0 refills | Status: DC | PRN

## 2020-04-24 MED ORDER — ASCORBIC ACID 500 MG PO TABS: 500.0000 mg | ORAL_TABLET | Freq: Every day | ORAL | 0 refills | Status: AC

## 2020-04-24 MED ORDER — HYDROCOD POLST-CPM POLST ER 10-8 MG/5ML PO SUER
5.0000 mL | Freq: Two times a day (BID) | ORAL | 0 refills | Status: DC | PRN
Start: 2020-04-24 — End: 2020-11-24

## 2020-04-24 MED ORDER — DEXAMETHASONE 6 MG PO TABS: 6.0000 mg | ORAL_TABLET | Freq: Every day | ORAL | 0 refills | Status: DC

## 2020-04-24 MED ORDER — LOSARTAN POTASSIUM 50 MG PO TABS: 75.0000 mg | ORAL_TABLET | Freq: Every day | ORAL | Status: DC

## 2020-04-24 MED ORDER — ZINC SULFATE 220 (50 ZN) MG PO CAPS: 220.0000 mg | ORAL_CAPSULE | Freq: Every day | ORAL | 0 refills | Status: DC

## 2020-04-24 NOTE — Progress Notes (Signed)
Discharge instructions given. Patient verbalized understanding and all questions were answered. [facility] to transport patient.

## 2020-04-24 NOTE — Progress Notes (Signed)
Pt being discharged home, [facility] providing transportation. Going home on 2L [place]. AVIS printed and sent with patient as well as all of personal belongings (cell-phone in pt's hand). Pt refused home walker, states "has one at home, does not want the wheelchair provided". Vital signs and Blood sugar taken before discharge. VS stable. Pt educated and verbalizes understanding.

## 2020-04-24 NOTE — Discharge Instructions (Signed)
10 Things You Can Do to Manage Your COVID-19 Symptoms at Home If you have possible or confirmed COVID-19: 1. Stay home from work and school. And stay away from other public places. If you must go out, avoid using any kind of public transportation, ridesharing, or taxis. 2. Monitor your symptoms carefully. If your symptoms get worse, call your healthcare provider immediately. 3. Get rest and stay hydrated. 4. If you have a medical appointment, call the healthcare provider ahead of time and tell them that you have or may have COVID-19. 5. For medical emergencies, call 911 and notify the dispatch personnel that you have or may have COVID-19. 6. Cover your cough and sneezes with a tissue or use the inside of your elbow. 7. Wash your hands often with soap and water for at least 20 seconds or clean your hands with an alcohol-based hand sanitizer that contains at least 60% alcohol. 8. As much as possible, stay in a specific room and away from other people in your home. Also, you should use a separate bathroom, if available. If you need to be around other people in or outside of the home, wear a mask. 9. Avoid sharing personal items with other people in your household, like dishes, towels, and bedding. 10. Clean all surfaces that are touched often, like counters, tabletops, and doorknobs. Use household cleaning sprays or wipes according to the label instructions. cdc.gov/coronavirus 02/06/2019 This information is not intended to replace advice given to you by your health care provider. Make sure you discuss any questions you have with your health care provider. Document Revised: 07/11/2019 Document Reviewed: 07/11/2019 Elsevier Patient Education  2020 Elsevier Inc.  

## 2020-04-24 NOTE — TOC Transition Note (Addendum)
Transition of Care [facility]) - CM/SW Discharge Note   Patient Details  Name: Cory Kemp MRN: [id number] Date of Birth: 22-Sep-1940  Transition of Care [facility]) CM/SW Contact:  Lia Hopping, [place] Phone Number: 04/24/2020, 10:47 AM   Clinical Narrative:    CSW spoke with the patient by phone. Patient was under the impression today that he would transport himself home today by personal vehicle. CSW explain to the patient it will be unsafe for him to drive himself home. Patient report understanding.  Patient gave csw permission to reach out to his adult children. CSW reached out to the patient son Johnryan and notified him about patient discharge today.Unfortunately, family cannot transport home. Son agreeable to [facility] transport and family will make arrangements to pick up the patient car. CSW informed him about PT recommendation for home health. CSW arranged [facility].  Patient son confirm the patient has oxygen at home.   Son states patient does not have a RW at home.  RW ordered @11 :09am, [facility]. RW will be delivered to the patient room prior to d/c   Final next level of care: Home w Home Health Services Barriers to Discharge: Barriers Resolved   Patient Goals and CMS Choice Patient states their goals for this hospitalization and ongoing recovery are:: to go home CMS Medicare.gov Compare Post Acute Care list provided to:: Patient    Discharge Placement                       Discharge Plan and Services In-house Referral: Clinical Social Work Discharge Planning Services: CM Consult Post Acute Care Choice: Home Health                    HH Arranged: PT [place]: [facility] [employer] Date [place]: 04/24/20 Time [place]: 1007    Social Determinants of Health ([place]) Interventions     Readmission Risk Interventions No flowsheet data found.

## 2020-04-24 NOTE — Progress Notes (Signed)
Approx 1am pt awoke diaphoretic, bed & gown soaked from sweat. Pt did not have his Oxygen on when I found him like this.His O2sat on RA was 86-88%. He states he was not having trouble breathing but did sound as if he was having some trouble as he was having a forced exhalation( grunting sound) but still joking with me about wetting the bed. Placed his O2 @2L  back on and he rose to 94%.

## 2020-04-24 NOTE — Discharge Summary (Signed)
Physician Discharge Summary  Cory Kemp:[id number] DOB: 1941-04-11 DOA: 04/19/2020  PCP: Jani Gravel, MD  Admit date: 04/19/2020 Discharge date: 04/24/2020  Time spent: 35 minutes  Recommendations for Outpatient Follow-up:  Acute respiratory failure with hypoxia/Covid 19 pneumonia COVID-19 Labs  Recent Labs    04/22/20 0247 04/23/20 0219 04/24/20 0409  DDIMER 1.35* 1.03* 1.10*  FERRITIN 278 352* 368*  LDH  --  138 139  CRP 8.5* 4.2* 1.9*    Lab Results  Component Value Date   SARSCOV2NAA POSITIVE (A) 04/19/2020   [place] NEGATIVE 02/11/2019   [place] NEGATIVE 02/08/2019   [place] NEGATIVE 12/24/2018   Patient presenting from home with progressive shortness of breath. Found to be hypoxic with SPO2 79%. --Remdesivir, plan 5-day course  --9/15 Decadron 6 mg IV every 24 hours Day----> Solu-Medrol 40 mg BID--> Decadron 6 mg daily to complete 10-day course --prone for 2-3hrs every 12hrs if able --Continue supplemental oxygen, titrate to maintain SPO2> 88%  SATURATION QUALIFICATIONS: (Thisnote is usedto comply with regulatory documentation for home oxygen) Patient Saturations on Room Air at Rest =88% Patient Saturations on [employer] while Ambulatingonly 7 feet=84% Patient Saturations on2Liters of oxygen while Ambulating = 90% Please briefly explain why patient needs home oxygen:Pt required supplemental oxygen to achieve therapeutic level -Patient meets criteria for home O2 -2 L O2 titrate to maintain SPO2> 88% -Provide Inogen home O2 portable concentrator -Patient will be considered contagious until 05/10/2020 and will need to take the below recommended precautions; -Follow-up Dr Larey Days on Wednesday 6 October@1500  at [facility] pulmonary clinic  Community-acquired pneumonia Patient presenting with hypoxia, fever 104.3, tachypnea and confusion. Procalcitonin elevated at 3.65. Chest x-ray is what you x-ray with hyperinflation consistent with  emphysematous disease and scarring bases. Given his severe lung disease from underlying COPD concerns for secondary pneumonia especially given high fever. --Now afebrile past 24 hours --Blood cultures x2:No growth x1 day --MRSA PCR negative, discontinue vancomycin9/14 --DC cefepime, start azithromycin and ceftriaxone -Obtain procalcitonin/lactic acid which will guide antibiotic therapy -Completed 5 days antibiotics  COPD --continue home Daliresp 258mcg PO daily and Incruse Ellipta  Elevated troponin Results for Cory Kemp, Cory Kemp (MRN [id number]) as of 04/24/2020 09:33  Ref. Range 04/19/2020 19:26 04/19/2020 22:53 04/20/2020 00:05 04/20/2020 04:48 04/20/2020 06:31  Troponin I (High Sensitivity) Latest Ref Range: <18 ng/L 50 (H) 44 (H) 55 (H) 55 (H) 49 (H)   -EKG was sinus tachycardia, multiple PVCs.  No apparent ST elevation/depression -ST elevation minimal most consistent with demand ischemia -TTE with EF 60 to 75%, moderate LVH, no evidence of MR, calcified aortic valve without regurgitation or evidence of stenosis, IVC dilated. --Currently chest pain-free  Essential hypertension --9/17 increase losartan 75 mg daily  -Schedule appointment with Dr. Jani Gravel after October 3 for follow-up essential hypertension,   Type 2 diabetes mellitus Diet controlled at home. Hemoglobin A1c 5.9, well controlled. --Insulin sliding scale for coverage while inpatient --CBGs qAC/HS  Depression --Continue Lexapro 10 mg p.o. nightly  Weakness, deconditioning, debility: --Pending PT/OT evaluation   Discharge Diagnoses:  Active Problems:   Diabetes mellitus (HCC)   COPD (chronic obstructive pulmonary disease) (HCC)   Essential hypertension   Hypoxia   Elevated troponin   PVC's (premature ventricular contractions)   Acute respiratory disease due to COVID-19 virus   Pneumonia due to COVID-19 virus   Discharge Condition: Stable  Diet recommendation: Carb modified  Filed Weights    04/21/20 0035  Weight: 79.8 kg    History of present  illness:  Cory Kemp a 79 year old WM PMHx hypertension, type 2 diabetes mellitus, BPH, stage D COPD on 2 L nasal cannula at home, OSA and questionable dementia   Presented to the ED with progressive shortness of breath and fever. Patient apparently drove himself to the [facility], ED, however he cannot remove himself from the car. Patient was noted to have increased respiratory rate of 35 with SPO2 79% on room air with a temperature of 104.3. To BC count 3.3, potassium 3.4, creatinine 1.9, troponin 50, BNP 248. Covid-19 PCR positive, lactic acid 1.2. Chest x-ray with emphysema. Patient was started on empiric antibiotics with vancomycin, cefepime, Flagyl and given 1 L LR bolus. Hospitalist consulted for admission.  Hospital Course:  See above   Cultures   9/12 SARS coronavirus positive  Antibiotics Anti-infectives (From admission, onward)   Start     Ordered Stop   04/21/20 1200  azithromycin (ZITHROMAX) tablet 500 mg  Status:  Discontinued        04/21/20 1006 04/23/20 2106   04/21/20 1200  cefTRIAXone (ROCEPHIN) 1 g in sodium chloride 0.9 % 100 mL IVPB  Status:  Discontinued        04/21/20 1006 04/23/20 2106   04/20/20 1200  vancomycin (VANCOCIN) IVPB 1000 mg/200 mL premix  Status:  Discontinued        04/20/20 0141 04/21/20 0644   04/20/20 1000  remdesivir 100 mg in sodium chloride 0.9 % 100 mL IVPB       "Followed by" Linked Group Details   04/19/20 2359 04/23/20 1115   04/20/20 0400  ceFEPIme (MAXIPIME) 2 g in sodium chloride 0.9 % 100 mL IVPB  Status:  Discontinued        04/20/20 0141 04/21/20 1006   04/20/20 0000  remdesivir 200 mg in sodium chloride 0.9% 250 mL IVPB       "Followed by" Linked Group Details   04/19/20 2359 04/20/20 0147   04/19/20 2000  vancomycin (VANCOREADY) IVPB 1500 mg/300 mL        04/19/20 1929 04/19/20 2349   04/19/20 1930  ceFEPIme (MAXIPIME) 2 g in sodium chloride 0.9 % 100 mL  IVPB        04/19/20 1926 04/19/20 2020   04/19/20 1930  metroNIDAZOLE (FLAGYL) IVPB 500 mg        04/19/20 1926 04/19/20 2118   04/19/20 1930  vancomycin (VANCOCIN) IVPB 1000 mg/200 mL premix  Status:  Discontinued        04/19/20 1926 04/19/20 1929       Discharge Exam: Vitals:   04/23/20 0529 04/23/20 1351 04/23/20 1943 04/24/20 0423  BP: (!) 160/64 138/62 (!) 150/73 (!) 167/67  Pulse: 60 64 65 (!) 55  Resp: (!) 24 (!) 21 20 (!) 21  Temp: 98.8 F (37.1 C) 97.7 F (36.5 C) 98.5 F (36.9 C) 97.6 F (36.4 C)  TempSrc:  Oral    SpO2: 98% 95% 97% 98%  Weight:      Height:        General: A/O x4, positive acute respiratory distress Eyes: negative scleral hemorrhage, negative anisocoria, negative icterus ENT: Negative Runny nose, negative gingival bleeding, Neck:  Negative scars, masses, torticollis, lymphadenopathy, JVD Lungs: decreased breath sounds bilaterally without wheezes or crackles Cardiovascular: Regular rate and rhythm without murmur gallop or rub normal S1 and S2 Do all of which Discharge Instructions  Discharge Instructions    Diet - low sodium heart healthy   Complete by: As directed  Discharge instructions   Complete by: As directed    ?   Person Under Monitoring Name: Cory Kemp  Location: [id number] [place] [address]   Infection Prevention Recommendations for Individuals Confirmed to have, or Being Evaluated for, 2019 Novel Coronavirus (COVID-19) Infection Who Receive Care at Home  Individuals who are confirmed to have, or are being evaluated for, COVID-19 should follow the prevention steps below until a healthcare provider or local or state health department says they can return to normal activities.  Stay home except to get medical care You should restrict activities outside your home, except for getting medical care. Do not go to work, school, or public areas, and do not use public transportation or taxis.  Call ahead  before visiting your doctor Before your medical appointment, call the healthcare provider and tell them that you have, or are being evaluated for, COVID-19 infection. This will help the healthcare provider's office take steps to keep other people from getting infected. Ask your healthcare provider to call the local or state health department.  Monitor your symptoms Seek prompt medical attention if your illness is worsening (e.g., difficulty breathing). Before going to your medical appointment, call the healthcare provider and tell them that you have, or are being evaluated for, COVID-19 infection. Ask your healthcare provider to call the local or state health department.  Wear a facemask You should wear a facemask that covers your nose and mouth when you are in the same room with other people and when you visit a healthcare provider. People who live with or visit you should also wear a facemask while they are in the same room with you.  Separate yourself from other people in your home As much as possible, you should stay in a different room from other people in your home. Also, you should use a separate bathroom, if available.  Avoid sharing household items You should not share dishes, drinking glasses, cups, eating utensils, towels, bedding, or other items with other people in your home. After using these items, you should wash them thoroughly with soap and water.  Cover your coughs and sneezes Cover your mouth and nose with a tissue when you cough or sneeze, or you can cough or sneeze into your sleeve. Throw used tissues in a lined trash can, and immediately wash your hands with soap and water for at least 20 seconds or use an alcohol-based hand rub.  Wash your [facility] your hands often and thoroughly with soap and water for at least 20 seconds. You can use an alcohol-based hand sanitizer if soap and water are not available and if your hands are not visibly dirty. Avoid touching  your eyes, nose, and mouth with unwashed hands.   Prevention Steps for Caregivers and Household Members of Individuals Confirmed to have, or Being Evaluated for, COVID-19 Infection Being Cared for in the Home  If you live with, or provide care at home for, a person confirmed to have, or being evaluated for, COVID-19 infection please follow these guidelines to prevent infection:  Follow healthcare provider's instructions Make sure that you understand and can help the patient follow any healthcare provider instructions for all care.  Provide for the patient's basic needs You should help the patient with basic needs in the home and provide support for getting groceries, prescriptions, and other personal needs.  Monitor the patient's symptoms If they are getting sicker, call his or her medical provider and tell them that the patient has, or  is being evaluated for, COVID-19 infection. This will help the healthcare provider's office take steps to keep other people from getting infected. Ask the healthcare provider to call the local or state health department.  Limit the number of people who have contact with the patient If possible, have only one caregiver for the patient. Other household members should stay in another home or place of residence. If this is not possible, they should stay in another room, or be separated from the patient as much as possible. Use a separate bathroom, if available. Restrict visitors who do not have an essential need to be in the home.  Keep older adults, very young children, and other sick people away from the patient Keep older adults, very young children, and those who have compromised immune systems or chronic health conditions away from the patient. This includes people with chronic heart, lung, or kidney conditions, diabetes, and cancer.  Ensure good ventilation Make sure that shared spaces in the home have good air flow, such as from an air conditioner  or an opened window, weather permitting.  Wash your hands often Wash your hands often and thoroughly with soap and water for at least 20 seconds. You can use an alcohol based hand sanitizer if soap and water are not available and if your hands are not visibly dirty. Avoid touching your eyes, nose, and mouth with unwashed hands. Use disposable paper towels to dry your hands. If not available, use dedicated cloth towels and replace them when they become wet.  Wear a facemask and gloves Wear a disposable facemask at all times in the room and gloves when you touch or have contact with the patient's blood, body fluids, and/or secretions or excretions, such as sweat, saliva, sputum, nasal mucus, vomit, urine, or feces.  Ensure the mask fits over your nose and mouth tightly, and do not touch it during use. Throw out disposable facemasks and gloves after using them. Do not reuse. Wash your hands immediately after removing your facemask and gloves. If your personal clothing becomes contaminated, carefully remove clothing and launder. Wash your hands after handling contaminated clothing. Place all used disposable facemasks, gloves, and other waste in a lined container before disposing them with other household waste. Remove gloves and wash your hands immediately after handling these items.  Do not share dishes, glasses, or other household items with the patient Avoid sharing household items. You should not share dishes, drinking glasses, cups, eating utensils, towels, bedding, or other items with a patient who is confirmed to have, or being evaluated for, COVID-19 infection. After the person uses these items, you should wash them thoroughly with soap and water.  Wash laundry thoroughly Immediately remove and wash clothes or bedding that have blood, body fluids, and/or secretions or excretions, such as sweat, saliva, sputum, nasal mucus, vomit, urine, or feces, on them. Wear gloves when handling laundry  from the patient. Read and follow directions on labels of laundry or clothing items and detergent. In general, wash and dry with the warmest temperatures recommended on the label.  Clean all areas the individual has used often Clean all touchable surfaces, such as counters, tabletops, doorknobs, bathroom fixtures, toilets, phones, keyboards, tablets, and bedside tables, every day. Also, clean any surfaces that may have blood, body fluids, and/or secretions or excretions on them. Wear gloves when cleaning surfaces the patient has come in contact with. Use a diluted bleach solution (e.g., dilute bleach with 1 part bleach and 10 parts water) or a household  disinfectant with a label that says EPA-registered for coronaviruses. To make a bleach solution at home, add 1 tablespoon of bleach to 1 quart (4 cups) of water. For a larger supply, add  cup of bleach to 1 gallon (16 cups) of water. Read labels of cleaning products and follow recommendations provided on product labels. Labels contain instructions for safe and effective use of the cleaning product including precautions you should take when applying the product, such as wearing gloves or eye protection and making sure you have good ventilation during use of the product. Remove gloves and wash hands immediately after cleaning.  Monitor yourself for signs and symptoms of illness Caregivers and household members are considered close contacts, should monitor their health, and will be asked to limit movement outside of the home to the extent possible. Follow the monitoring steps for close contacts listed on the symptom monitoring form.   ? If you have additional questions, contact your local health department or call the epidemiologist on call at [phone] (available 24/7). ? This guidance is subject to change. For the most up-to-date guidance from CDC, please refer to their  website: YouBlogs.pl   Increase activity slowly   Complete by: As directed      Allergies as of 04/24/2020      Reactions   Adhesive [tape] Rash   No "PLASTIC" tape!!! Patient broke out in blisters!!      Medication List    STOP taking these medications   diclofenac Sodium 1 % Gel Commonly known as: VOLTAREN   HYDROcodone-acetaminophen 5-325 MG tablet Commonly known as: NORCO/VICODIN   simvastatin 40 MG tablet Commonly known as: ZOCOR   Trelegy Ellipta 100-62.5-25 MCG/INH Aepb Generic drug: Fluticasone-Umeclidin-Vilant     TAKE these medications   albuterol (2.5 MG/3ML) 0.083% nebulizer solution Commonly known as: PROVENTIL Take 3 mLs (2.5 mg total) by nebulization every 6 (six) hours as needed for wheezing or shortness of breath.   Ventolin HFA 108 (90 Base) MCG/ACT inhaler Generic drug: albuterol Inhale 2 puffs into the lungs every 4 (four) hours as needed for wheezing or shortness of breath.   arformoterol 15 MCG/2ML Nebu Commonly known as: BROVANA Take 15 mcg by nebulization 2 (two) times daily.   ascorbic acid 500 MG tablet Commonly known as: VITAMIN C Take 1 tablet (500 mg total) by mouth daily. Start taking on: April 25, 2020   atorvastatin 40 MG tablet Commonly known as: LIPITOR Take 1 tablet (40 mg total) by mouth daily. Start taking on: April 25, 2020   chlorpheniramine-HYDROcodone 10-8 MG/5ML Suer Commonly known as: TUSSIONEX Take 5 mLs by mouth every 12 (twelve) hours as needed for cough.   Daliresp 250 MCG Tabs Generic drug: Roflumilast Take 250 mg by mouth daily. What changed: Another medication with the same name was removed. Continue taking this medication, and follow the directions you see here.   dexamethasone 6 MG tablet Commonly known as: DECADRON Take 1 tablet (6 mg total) by mouth daily.   escitalopram 10 MG tablet Commonly known as: LEXAPRO Take 10 mg by mouth  at bedtime.   fluticasone 50 MCG/ACT nasal spray Commonly known as: Flonase Place 2 sprays into both nostrils daily. Reduce to 1 spray each nostril daily after stopping Afrin What changed:   when to take this  reasons to take this  additional instructions   guaiFENesin-dextromethorphan 100-10 MG/5ML syrup Commonly known as: ROBITUSSIN DM Take 10 mLs by mouth every 4 (four) hours as needed for cough.   ibuprofen  200 MG tablet Commonly known as: ADVIL Take 400 mg by mouth at bedtime.   ipratropium-albuterol 0.5-2.5 (3) MG/3ML Soln Commonly known as: DUONEB Take 3 mLs by nebulization every 4 (four) hours as needed (wheezing shortness of breath).   Combivent Respimat 20-100 MCG/ACT Aers respimat Generic drug: Ipratropium-Albuterol Inhale 1 puff into the lungs every 6 (six) hours as needed for wheezing or shortness of breath.   losartan 25 MG tablet Commonly known as: COZAAR Take 3 tablets (75 mg total) by mouth at bedtime. What changed:   medication strength  how much to take  when to take this   OXYGEN Inhale 4 L into the lungs continuous.   zinc sulfate 220 (50 Zn) MG capsule Take 1 capsule (220 mg total) by mouth daily. Start taking on: April 25, 2020            Durable Medical Equipment  (From admission, onward)         Start     Ordered   04/23/20 2055  For home use only DME oxygen  Once       Comments: SATURATION QUALIFICATIONS: (This note is used to comply with regulatory documentation for home oxygen) Patient Saturations on Room Air at Rest = 88% Patient Saturations on Room Air while Ambulating only 7 feet = 84% Patient Saturations on 2 Liters of oxygen while Ambulating = 90% Please briefly explain why patient needs home oxygen:  Pt required supplemental oxygen to achieve therapeutic level -Patient meets criteria for home O2 -2 L O2 titrate to maintain SPO2> 88% -Provide Inogen home O2 portable concentrator  Question Answer Comment  Length  of Need Lifetime   Mode or (Route) Nasal cannula   Liters per Minute 2   Frequency Continuous (stationary and portable oxygen unit needed)   Oxygen conserving device Yes   Oxygen delivery system Gas      04/23/20 2054         Allergies  Allergen Reactions  . Adhesive [Tape] Rash    No "PLASTIC" tape!!! Patient broke out in blisters!!    Follow-up Information    Jani Gravel, MD. Schedule an appointment as soon as possible for a visit.   Specialty: Internal Medicine Why: Schedule appointment with Dr. Jani Gravel after October 3 for follow-up essential hypertension, Contact information: [address] [place] [address] [phone]        Lanier Clam, MD Follow up on 05/10/2020.   Specialty: Internal Medicine Why: Follow-up Dr Larey Days on Wednesday 6 October@1500  at [facility] pulmonary clinic Contact information: [address] [place] [address] [phone]                The results of significant diagnostics from this hospitalization (including imaging, microbiology, ancillary and laboratory) are listed below for reference.    Significant Diagnostic Studies: DG Chest Port 1 View  Result Date: 04/19/2020 CLINICAL DATA:  Shortness of breath EXAM: PORTABLE CHEST 1 VIEW COMPARISON:  04/10/2019, CT 02/11/2019 FINDINGS: Hyperinflation with emphysematous disease. No acute consolidation or pleural effusion. Probable scarring at the bases. Stable cardiomediastinal silhouette with aortic atherosclerosis. No pneumothorax. IMPRESSION: Hyperinflation with emphysematous disease and probable scarring at the bases. Electronically Signed   By: Donavan Foil M.D.   On: 04/19/2020 20:13   ECHOCARDIOGRAM LIMITED  Result Date: 04/20/2020    ECHOCARDIOGRAM REPORT   Patient Name:   Cory Kemp [facility] Date of Exam: 04/20/2020 Medical Rec #:  [id number]     Height:  73.0 in Accession #:    [id number]    Weight:       178.6 lb Date of Birth:   October 03, 1940     BSA:          2.051 m Patient Age:    54 years      BP:           104/81 mmHg Patient Gender: M             HR:           93 bpm. Exam Location:  Inpatient Procedure: 2D Echo Indications:    Troponin Level Elevated  History:        Patient has prior history of Echocardiogram examinations, most                 recent 04/11/2016. COPD; Risk Factors:Hypertension, Dyslipidemia,                 Diabetes and Alcohol Abuse.  Sonographer:    Mikki Santee RDCS (AE) Referring Phys: [id number] [facility]  Sonographer Comments: Technically challenging study due to limited acoustic windows, no apical window and suboptimal subcostal window. Image acquisition challenging due to respiratory motion. IMPRESSIONS  1. Very limited echo with poor windows. LVEF assessed essentially with the PLA views.  2. Left ventricular ejection fraction, by estimation, is 60 to 65%. The left ventricle has normal function. Left ventricular endocardial border not optimally defined to evaluate regional wall motion. There is moderate left ventricular hypertrophy. Left ventricular diastolic function could not be evaluated.  3. Right ventricular systolic function was not well visualized. The right ventricular size is not well visualized. There is normal pulmonary artery systolic pressure.  4. The mitral valve was not well visualized. No evidence of mitral valve regurgitation.  5. The aortic valve is calcified. Aortic valve regurgitation is not visualized. Mild to moderate aortic valve sclerosis/calcification is present, without any evidence of aortic stenosis.  6. The inferior vena cava is dilated in size with >50% respiratory variability, suggesting right atrial pressure of 8 mmHg. FINDINGS  Left Ventricle: Left ventricular ejection fraction, by estimation, is 60 to 65%. The left ventricle has normal function. Left ventricular endocardial border not optimally defined to evaluate regional wall motion. The left ventricular internal  cavity size was normal in size. There is moderate left ventricular hypertrophy. Left ventricular diastolic function could not be evaluated due to indeterminate diastolic function. Left ventricular diastolic function could not be evaluated. Right Ventricle: The right ventricular size is not well visualized. Right vetricular wall thickness was not assessed. Right ventricular systolic function was not well visualized. There is normal pulmonary artery systolic pressure. The tricuspid regurgitant velocity is 2.42 m/s, and with an assumed right atrial pressure of 8 mmHg, the estimated right ventricular systolic pressure is 33.8 mmHg. Left Atrium: Left atrial size was not assessed. Right Atrium: Right atrial size was not assessed. Pericardium: There is no evidence of pericardial effusion. Mitral Valve: The mitral valve was not well visualized. No evidence of mitral valve regurgitation. Tricuspid Valve: The tricuspid valve is grossly normal. Tricuspid valve regurgitation is trivial. Aortic Valve: The aortic valve is calcified. Aortic valve regurgitation is not visualized. Mild to moderate aortic valve sclerosis/calcification is present, without any evidence of aortic stenosis. Pulmonic Valve: The pulmonic valve was not assessed. Pulmonic valve regurgitation is not visualized. Aorta: The aortic root and ascending aorta are structurally normal, with no evidence of dilitation. Venous: The inferior vena cava is dilated in size  with greater than 50% respiratory variability, suggesting right atrial pressure of 8 mmHg. IAS/Shunts: No atrial level shunt detected by color flow Doppler.  LEFT VENTRICLE PLAX 2D LVIDd:         4.16 cm LVIDs:         2.88 cm LV PW:         1.39 cm LV IVS:        1.36 cm LVOT diam:     2.40 cm LVOT Area:     4.52 cm  LEFT ATRIUM         Index LA diam:    4.30 cm 2.10 cm/m   AORTA Ao Root diam: 3.10 cm TRICUSPID VALVE TR Peak grad:   23.4 mmHg TR Vmax:        242.00 cm/s  SHUNTS Systemic Diam: 2.40 cm  Lyman Bishop MD Electronically signed by Lyman Bishop MD Signature Date/Time: 04/20/2020/4:36:54 PM    Final     Microbiology: Recent Results (from the past 240 hour(s))  Blood Culture (routine x 2)     Status: None (Preliminary result)   Collection Time: 04/19/20  7:26 PM   Specimen: BLOOD RIGHT FOREARM  Result Value Ref Range Status   Specimen Description   Final    BLOOD RIGHT FOREARM Performed at [facility], [place] [address], [place], [place] [address]    Special Requests   Final    BOTTLES DRAWN AEROBIC AND ANAEROBIC Blood Culture results may not be optimal due to an excessive volume of blood received in culture bottles Performed at [place] [address], [place], [place] [address]    Culture   Final    NO GROWTH 4 DAYS Performed at [facility], [place] [address], [place], [place] [address]    Report Status PENDING  Incomplete  Urine culture     Status: None   Collection Time: 04/19/20  7:26 PM   Specimen: In/Out Cath Urine  Result Value Ref Range Status   Specimen Description   Final    IN/OUT CATH URINE Performed at [place] [address], [place], [place] [address]    Special Requests   Final    NONE Performed at [facility], [place] [address], [place], [place] [address]    Culture   Final    NO GROWTH Performed at [facility], [place] [address], [place], [place] [address]    Report Status 04/20/2020 FINAL  Final  SARS Coronavirus 2 by RT PCR (hospital order, performed in [facility] hospital lab) Nasopharyngeal Nasopharyngeal Swab     Status: Abnormal   Collection Time: 04/19/20  7:50 PM   Specimen: Nasopharyngeal Swab  Result Value Ref Range Status   SARS Coronavirus 2 POSITIVE (A) NEGATIVE Final    Comment: RESULT CALLED TO, READ BACK BY AND VERIFIED WITH: TALKINGTON,J.,RN @ 2158 04/19/20 BY TURNER,S. (NOTE) SARS-CoV-2 target nucleic acids are DETECTED  SARS-CoV-2  RNA is generally detectable in upper respiratory specimens  during the acute phase of infection.  Positive results are indicative  of the presence of the identified virus, but do not rule out bacterial infection or co-infection with other pathogens not detected by the test.  Clinical correlation with patient history and  other diagnostic information is necessary to determine patient infection status.  The expected result is negative.  Fact Sheet for Patients:   StrictlyIdeas.no   Fact Sheet for Healthcare Providers:   BankingDealers.co.za  This test is not yet approved or cleared by the [place] and  has been authorized for detection and/or diagnosis of SARS-CoV-2 by FDA under an Emergency Use Authorization (EUA).  This EUA will remain in effect (meani ng this test can be used) for the duration of  the COVID-19 declaration under Section 564(b)(1) of the Act, 21 U.S.C. section 360-bbb-3(b)(1), unless the authorization is terminated or revoked sooner.  Performed at [facility], [place] [address] [place], [place] [address]   Blood Culture (routine x 2)     Status: None (Preliminary result)   Collection Time: 04/19/20  8:45 PM   Specimen: BLOOD  Result Value Ref Range Status   Specimen Description   Final    BLOOD RIGHT ANTECUBITAL Performed at [place] [address], [place], [place] [address]    Special Requests   Final    BOTTLES DRAWN AEROBIC AND ANAEROBIC Blood Culture adequate volume Performed at [place] [address], [place], [place] [address]    Culture   Final    NO GROWTH 4 DAYS Performed at [facility], [place] [address], [place], [place] [address]    Report Status PENDING  Incomplete  MRSA PCR Screening     Status: None   Collection Time: 04/21/20 12:38 AM   Specimen: Nasopharyngeal  Result Value Ref Range Status   MRSA by PCR  NEGATIVE NEGATIVE Final    Comment:        The GeneXpert MRSA Assay (FDA approved for NASAL specimens only), is one component of a comprehensive MRSA colonization surveillance program. It is not intended to diagnose MRSA infection nor to guide or monitor treatment for MRSA infections. Performed at [facility], [name] [address], [place], [place] [address]      Labs: Basic Metabolic Panel: Recent Labs  Lab 04/20/20 0448 04/21/20 0246 04/22/20 0247 04/23/20 0219 04/24/20 0409  NA 134* 130* 136 135 137  K 3.5 4.0 4.3 4.2 4.9  CL 98 95* 106 103 103  CO2 25 24 22 24 24   GLUCOSE 114* 107* 141* 136* 241*  BUN 21 41* 38* 41* 34*  CREATININE 0.92 1.13 1.01 1.07 0.95  CALCIUM 8.8* 8.6* 8.2* 7.9* 8.3*  MG  --  1.8 2.1 2.1 2.2  PHOS  --   --  3.9 3.1 2.5   Liver Function Tests: Recent Labs  Lab 04/20/20 0448 04/21/20 0246 04/22/20 0247 04/23/20 0219 04/24/20 0409  AST 27 30 34 58* 65*  ALT 17 16 18 24  35  ALKPHOS 57 50 47 47 49  BILITOT 0.4 0.5 0.5 0.5 0.3  PROT 6.4* 5.9* 6.1* 5.8* 5.6*  ALBUMIN 2.9* 2.7* 2.8* 2.6* 2.6*   No results for input(s): LIPASE, AMYLASE in the last 168 hours. No results for input(s): AMMONIA in the last 168 hours. CBC: Recent Labs  Lab 04/20/20 0448 04/21/20 0246 04/22/20 0247 04/23/20 0219 04/24/20 0409  WBC 7.5 11.6* 4.5 2.2* 2.6*  NEUTROABS 5.5 9.8* 3.5 1.1* 1.5*  HGB 11.1* 11.7* 11.6* 11.2* 11.4*  HCT 34.6* 36.9* 37.8* 35.7* 37.7*  MCV 86.1 86.4 88.9 87.1 89.3  PLT 108* 116* 103* 94* 112*   Cardiac Enzymes: No results for input(s): CKTOTAL, CKMB, CKMBINDEX, TROPONINI in the last 168 hours. BNP: BNP (last 3 results) Recent Labs    04/19/20 1928  BNP 247.9*    ProBNP (last 3 results) No results for input(s): PROBNP in the last 8760 hours.  CBG: Recent Labs  Lab 04/23/20 0806 04/23/20 1151 04/23/20 1646 04/23/20 1945 04/24/20 0741  GLUCAP 132* 197* 207* 159* 154*       Signed:  Dia Crawford, MD [facility] [phone] pager

## 2020-04-24 NOTE — Consult Note (Signed)
   [facility] CM Inpatient Consult   04/24/2020  DONJUAN ROBISON 1941-06-25 [id number]   Patient chart reviewed for [facility] for chronic disease management services. Patient had been active with [facility] CM health coach for COPD management.   Attempted to speak with Mr. Macke by telephone but call was unanswered. Current disposition is for home with home health services. Referral for [facility] CM RN community follow up placed for assessment for community needs and reengage in disease management program.  Of note, [facility] Care Management services does not replace or interfere with any services that are arranged by inpatient case management or social work.  Netta Cedars, MSN, [facility] Liaison Nurse Mobile Phone [phone]  Toll free office [phone]

## 2020-04-24 NOTE — Progress Notes (Signed)
[facility] arranged to transport. Son notified.

## 2020-04-25 LAB — CULTURE, BLOOD (ROUTINE X 2)
Culture: NO GROWTH
Culture: NO GROWTH
Special Requests: ADEQUATE

## 2020-04-27 ENCOUNTER — Other Ambulatory Visit: Payer: Self-pay | Admitting: *Deleted

## 2020-04-27 ENCOUNTER — Encounter: Payer: Self-pay | Admitting: *Deleted

## 2020-04-27 NOTE — Patient Outreach (Signed)
Cory Kemp) Care Management  04/27/2020  Cory Kemp [birth year] [id number]   Referral Date: 9/17 Referral Source: Hospital Liaison Referral Reason: Post hospital (PCP will complete TOC) Insurance: Next Gen   Outreach attempt #1, successful.  Identity verified.  This care manager introduced self and stated purpose of call.  [facility] care management services explained.    Social: Patient report living alone, has daughter Cory Kemp to help with management of his care.  State he does have a friend that comes in to help, does stay with him at times.  Member report he is still employed with the [employer], would love to get his breathing back under control and recover from Covid to get back to work.  He was under the impression that his quarantine days would end on 9/25, advised that his date is 10/3.  He verbalizes understanding, has been vaccinated.  He will have [facility] for home health and PT once his quarantine is complete.  He does express that he is still tired/weak at times, agrees to have CSW contact him regarding assistance with prepared meals during his recovery.  He has been monitoring his temperature and oxygen levels daily, highest temp - 98.1 and lowest O2 level is 90%.  He does still have a cough, able to produce sputum.  Discussed deep breathing/coughing exercises, her verbalizes understanding.  Conditions: Per chart, has history of HTN, COPD, DM (A1C - 5.9 in Sept 2021), and former smoker.  Medications: Reviewed with member, report he is taking all as instructed.  State his daughter was able to get new discharge prescriptions from pharmacy, denies need for financial assistance.  Appointments: He is aware he is unable to attend office visits until after his quarantine is complete, has appointment scheduled with pulmonology on 10/6.  Does not have visit with PCP, encouraged to go ahead and get it scheduled for after 10/3.  State he will have his daughter call to do this.  Denies  any urgent concerns, encouraged to contact this care manager with questions.  Plan: RN CM will follow up with member within the next 2 weeks.  Will send EMMI education regarding Covid recovery.  Goals    .  Client will verbalize knowledge of chronic lung disease as evidenced by no ED visits or Inpatient stays related to chronic lung disease       CARE PLAN ENTRY (see longtitudinal plan of care for additional care plan information)  Current Barriers:  [name] Kitchen Knowledge deficits related to basic understanding of COPD disease process . Knowledge deficits related to basic COPD self care/management . Knowledge deficit related to importance of energy conservation   Case Manager Clinical Goal(s):  Over the next 90 days, patient will be able to verbalize understanding of COPD action plan and when to seek appropriate levels of medical care  Over the next 90 days, patient will engage in lite exercise as tolerated to build/regain stamina and strength and reduce shortness of breath through activity tolerance  Over the next 90 days, patient will verbalize basic understanding of COPD disease process and self care activities  Over the next 90 days, patient will not be hospitalized for COPD exacerbation   Interventions:   Provided patient with basic written and verbal COPD education on self care/management/and exacerbation prevention   Provided patient with COPD action plan and reinforced importance of daily self assessment  Advised patient to self assesses COPD action plan zone and make appointment with provider if in the yellow zone for 48 hours  without improvement.  Provided patient with education about the role of exercise in the management of COPD  Advised patient to engage in light exercise as tolerated 3-5 days a week  Provided education about and advised patient to utilize infection prevention strategies to reduce risk of respiratory infection   Patient Self Care Activities:  . Takes  medications as prescribed including inhalers . Self assesses COPD action plan zone and makes appointment with provider if in the yellow zone for 48 hours without improvement. . Engages in light exercise 3-5 days a week . Utilizes infection prevention strategies to reduce risk of respiratory infection   Initial goal documentation     .  THN - Recover from Covid and get back to work (pt-stated)      CARE PLAN ENTRY (see longitudinal plan of care for additional care plan information)  Current Barriers:  [name] Kitchen Knowledge Deficits related to COVID-19 and impact on patient self health management  Clinical Goal(s):  [name] Kitchen Over the next 30 days, patient will verbalize basic understanding of COVID-19 impact on individual health and self health management as evidenced by verbalization of basic understanding of COVID-19 as a viral disease, measures to prevent exposure, signs and symptoms, when to contact provider . Over the next 28 days, patient will have follow up appointments with PCP and pulmonology . Over the next 28 days, patient will report understanding and adherence to covid precautions and quarantine days  Interventions: . Discussed plans with patient for ongoing care management follow up and provided patient with direct contact information for care management team . Provided patient with EMMI educational materials related to Covid recovery . Reviewed scheduled/upcoming provider appointments including:   Patient Self Care Activities:  . Attends all scheduled provider appointments . Performs ADL's independently  Initial goal documentation                     Cory David, RN, MSN [place] [phone]

## 2020-05-04 ENCOUNTER — Other Ambulatory Visit: Payer: Self-pay | Admitting: *Deleted

## 2020-05-04 ENCOUNTER — Encounter: Payer: Self-pay | Admitting: *Deleted

## 2020-05-04 NOTE — Patient Outreach (Signed)
[facility]) Care Management  05/04/2020  Cory Kemp 04-29-1941 [id number]  CSW was able to make initial contact with patient today to perform phone assessment, as well as assess and assist with social work needs and services.  CSW introduced self, explained role and types of services provided through [facility] ([facility]).  CSW further explained to patient that CSW works with patient's RNCM, also with [facility], [name].  CSW then explained the reason for the call, indicating that [name] thought that patient would benefit from social work services and resources to assist with obtaining short-term prepared meal services.  CSW obtained two HIPAA compliant identifiers from patient, which included patient's name and date of birth.  Patient admitted that he was hospitalized at [facility] from Sunday, April 19, 2020 until Friday, April 24, 2020, suffering from symptoms of COVID-19 and Pneumonia.  Patient indicated that he is still suffering from aches, pains, respiratory distress, fatigue, weakness, etc.  Patient reported that his daughter, Cory Kemp is taking him tomorrow (Tuesday, May 05, 2020 at 11:00am) to get tested for COVID-19, as patient stated that he has been under quarantine since before his hospital admission.  Patient verbalized that he is currently living alone in his trailer because his roommate also contracted COVID-19 and has been hospitalized for more than a week, with no tentative discharge date scheduled.    CSW agreed to arrange a 28 day supply (4 weeks worth) of meals for patient, through [employer], courtesy of [facility].  Patient is aware that these meals are tailor-made to meet all of his dietary restrictions, food allergies and food preferences, and that they will be delivered directly to his front door in two week increments.  Patient was most  appreciative of these services, hoping that after a months time, he will be able to perform all activities of daily living independently again.  CSW will follow-up with patient again next week, on Wednesday, May 13, 2020, around 9:00am, to confirm that he received his first shipment of meals and that he is enjoying them.   [name], BSW, MSW, LCSW  Licensed Education officer, [facility]  Mailing [place] [address] [place], [place] [address] Physical Address[address] [place], [place] [address] Toll Free Main # [phone] Fax # [phone] Cell # [phone]  [email or web address]

## 2020-05-05 ENCOUNTER — Ambulatory Visit: Payer: Self-pay | Admitting: *Deleted

## 2020-05-05 DIAGNOSIS — Z20828 Contact with and (suspected) exposure to other viral communicable diseases: Secondary | ICD-10-CM | POA: Diagnosis not present

## 2020-05-07 ENCOUNTER — Other Ambulatory Visit: Payer: Self-pay | Admitting: Primary Care

## 2020-05-07 DIAGNOSIS — J449 Chronic obstructive pulmonary disease, unspecified: Secondary | ICD-10-CM | POA: Diagnosis not present

## 2020-05-07 DIAGNOSIS — J441 Chronic obstructive pulmonary disease with (acute) exacerbation: Secondary | ICD-10-CM | POA: Diagnosis not present

## 2020-05-07 DIAGNOSIS — R0600 Dyspnea, unspecified: Secondary | ICD-10-CM

## 2020-05-10 DIAGNOSIS — I10 Essential (primary) hypertension: Secondary | ICD-10-CM | POA: Diagnosis not present

## 2020-05-10 DIAGNOSIS — J449 Chronic obstructive pulmonary disease, unspecified: Secondary | ICD-10-CM | POA: Diagnosis not present

## 2020-05-10 DIAGNOSIS — R32 Unspecified urinary incontinence: Secondary | ICD-10-CM | POA: Diagnosis not present

## 2020-05-10 DIAGNOSIS — J1282 Pneumonia due to coronavirus disease 2019: Secondary | ICD-10-CM | POA: Diagnosis not present

## 2020-05-10 DIAGNOSIS — N401 Enlarged prostate with lower urinary tract symptoms: Secondary | ICD-10-CM | POA: Diagnosis not present

## 2020-05-10 DIAGNOSIS — E119 Type 2 diabetes mellitus without complications: Secondary | ICD-10-CM | POA: Diagnosis not present

## 2020-05-10 DIAGNOSIS — G4733 Obstructive sleep apnea (adult) (pediatric): Secondary | ICD-10-CM | POA: Diagnosis not present

## 2020-05-10 DIAGNOSIS — U071 COVID-19: Secondary | ICD-10-CM | POA: Diagnosis not present

## 2020-05-10 DIAGNOSIS — M1990 Unspecified osteoarthritis, unspecified site: Secondary | ICD-10-CM | POA: Diagnosis not present

## 2020-05-10 DIAGNOSIS — J9611 Chronic respiratory failure with hypoxia: Secondary | ICD-10-CM | POA: Diagnosis not present

## 2020-05-11 ENCOUNTER — Other Ambulatory Visit: Payer: Self-pay | Admitting: *Deleted

## 2020-05-11 NOTE — Patient Outreach (Signed)
[facility]) Care Management  05/11/2020  Cory Kemp [birth year] [id number]   Call placed to member to follow up on ongoing recovery from Covid.  He state today was his last day in isolation, have been able to get out and drive in his care however he is short of breath intermittently.  He has follow up appointment scheduled with pulmonology on 10/6, daughter will call to schedule with PCP.  He report daughter has been trying to find a way to get more help into the home.    Spoke with daughter, she report member has not been improving as well as she had hoped.  Confirms that he did receive the meals that were set up by CSW.  She state member will have home visit with palliative care tomorrow as they were concerned about member's prognosis.  She is concerned about member being able to care for himself adequately, looking to have someone come in the home, however member does not have Medicaid.  He does not have extra income since he is no longer working.  She is open to discussing options with CSW.    This care manager will collaborate with CSW (scheduled for outreach on 10/6).  Will follow up with member/daughter within the next month.  Goals Addressed              This Visit's Progress   .  [facility] - Make and Keep All Appointments        Follow Up Date 11/4   - ask family or friend for a ride - call to cancel if needed - keep a calendar with appointment dates    Why is this important?   Part of staying healthy is seeing the doctor for follow-up care.  If you forget your appointments, there are some things you can do to stay on track.    Notes:     .  THN - Manage Fatigue (Tiredness)        Follow Up Date 11/30    - eat healthy Use Covid precautions Participate with PT    Why is this important?   Feeling tired or worn out is a common symptom of COPD (chronic obstructive pulmonary disease).  Learning when you feel your best and when you need rest is important.    Managing the tiredness (fatigue) will help you be active and enjoy life.     Notes:     .  THN - Recover from Covid and get back to work (pt-stated)        CARE PLAN ENTRY (see longitudinal plan of care for additional care plan information)  Current Barriers:  [name] Kitchen Knowledge Deficits related to COVID-19 and impact on patient self health management  Clinical Goal(s):  [name] Kitchen Over the next 30 days, patient will verbalize basic understanding of COVID-19 impact on individual health and self health management as evidenced by verbalization of basic understanding of COVID-19 as a viral disease, measures to prevent exposure, signs and symptoms, when to contact provider . Over the next 28 days, patient will have follow up appointments with PCP and pulmonology . Over the next 28 days, patient will report understanding and adherence to covid precautions and quarantine days  Interventions: . Discussed plans with patient for ongoing care management follow up and provided patient with direct contact information for care management team . Provided patient with EMMI educational materials related to Covid recovery . Reviewed scheduled/upcoming provider appointments including:   Patient Self Care Activities:  .  Attends all scheduled provider appointments . Performs ADL's independently   Update:  Resolving due to duplicate goal                   Cory Kemp, Therapist, sports, MSN [place] [phone]

## 2020-05-13 ENCOUNTER — Institutional Professional Consult (permissible substitution): Payer: Medicare HMO | Admitting: Pulmonary Disease

## 2020-05-13 ENCOUNTER — Telehealth: Payer: Self-pay | Admitting: Hospice

## 2020-05-13 ENCOUNTER — Other Ambulatory Visit: Payer: Self-pay | Admitting: *Deleted

## 2020-05-13 NOTE — Patient Outreach (Signed)
Cory Kemp) Care Management  05/13/2020  Cory Kemp 03-Nov-1940 [id number]   CSW was able to make contact with patient today to follow-up regarding social work services and resources, as well as to ensure that patient received his first two weeks-worth of meals through [employer], courtesy of [facility].  Patient confirmed receipt, admitting that he is really enjoying the meals, and that he especially enjoys not having to cook.  Patient admitted that he is still able to perform all activities of daily living independently, he just tires more quickly and it takes him longer to complete certain tasks.  Patient indicated that he definitely "tries not to over-do-it", taking breaks often and using motorized scooters wherever offered.  CSW spoke with patient about having CSW assist him with arranging in-home care services, for which patient was agreeable.  CSW confirmed with patient that he only receives [employer], and that he would not qualify for Adult Medicaid, based on his income level.  CSW explained to patient that only Adult Medicaid recipients are eligible to apply for [employer] Forensic scientist), through the [place], or [employer] ([place]), through the [place].  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    Patient is now aware that he would have to private pay for in-home care services, encouraging CSW to contact his daughter, Juno Bozard, to discuss further.  Patient reported that Ms. Cornacchia currently handles all of his affairs.  CSW made an attempt to try and contact Ms. Regino, to discuss arranging in-home care services for patient; however, she was unavailable at the time of CSW's call.  CSW left a HIPAA compliant  message on voicemail for Ms. Wafer and is currently awaiting a return call.  CSW also attempted to contact patient's son, Gaurav Baldree., but he too, was unavailable at the time of CSW's call.  CSW left a HIPAA compliant message on voicemail for Mr. Zavalza and is currently awaiting a return call.  CSW will make a second outreach attempt next week, on Wednesday, May 20, 2020, around 11:00am.  [name], BSW, MSW, [employer]  Licensed Clinical Social Worker  [place]  Mailing [place] [address], [place], [place] [address] Physical Address-[address], [place], [place] [address] Toll Free Main # [phone] Fax # [phone] Cell # [phone]  [email or web address]

## 2020-05-13 NOTE — Telephone Encounter (Signed)
Called patient's daughter, Dalen Hennessee, to schedule a Palliative Consult with patient, no answer - left message with reason for call along with my name and cal back number.

## 2020-05-14 DIAGNOSIS — U071 COVID-19: Secondary | ICD-10-CM | POA: Diagnosis not present

## 2020-05-14 DIAGNOSIS — N401 Enlarged prostate with lower urinary tract symptoms: Secondary | ICD-10-CM | POA: Diagnosis not present

## 2020-05-14 DIAGNOSIS — J449 Chronic obstructive pulmonary disease, unspecified: Secondary | ICD-10-CM | POA: Diagnosis not present

## 2020-05-14 DIAGNOSIS — R32 Unspecified urinary incontinence: Secondary | ICD-10-CM | POA: Diagnosis not present

## 2020-05-14 DIAGNOSIS — M1990 Unspecified osteoarthritis, unspecified site: Secondary | ICD-10-CM | POA: Diagnosis not present

## 2020-05-14 DIAGNOSIS — G4733 Obstructive sleep apnea (adult) (pediatric): Secondary | ICD-10-CM | POA: Diagnosis not present

## 2020-05-14 DIAGNOSIS — E119 Type 2 diabetes mellitus without complications: Secondary | ICD-10-CM | POA: Diagnosis not present

## 2020-05-14 DIAGNOSIS — I10 Essential (primary) hypertension: Secondary | ICD-10-CM | POA: Diagnosis not present

## 2020-05-14 DIAGNOSIS — J1282 Pneumonia due to coronavirus disease 2019: Secondary | ICD-10-CM | POA: Diagnosis not present

## 2020-05-14 DIAGNOSIS — J9611 Chronic respiratory failure with hypoxia: Secondary | ICD-10-CM | POA: Diagnosis not present

## 2020-05-16 IMAGING — CT CT ANGIOGRAPHY CHEST
2 of 7 series · 17 of 46 positions shown · IV contrast (OMNIPAQUE)
Comparison: Chest radiograph dated 02/11/2019 and CT dated
12/25/2018

CLINICAL DATA: 77-year-old male with hypoxemia and respiratory
failure. A stent hospitalization for COPD exacerbation.

EXAM:
CT ANGIOGRAPHY CHEST WITH CONTRAST
TECHNIQUE: Multidetector CT imaging of the chest was performed using the
standard protocol during bolus administration of intravenous
contrast. Multiplanar CT image reconstructions and MIPs were
obtained to evaluate the vascular anatomy.
CONTRAST:  100mL OMNIPAQUE IOHEXOL 350 MG/ML SOLN

[Series 6: thins · axial · 0.86mm/px · z∈[-418,-83]mm · 14 of 367 slices shown]
[im 16/367  lung]
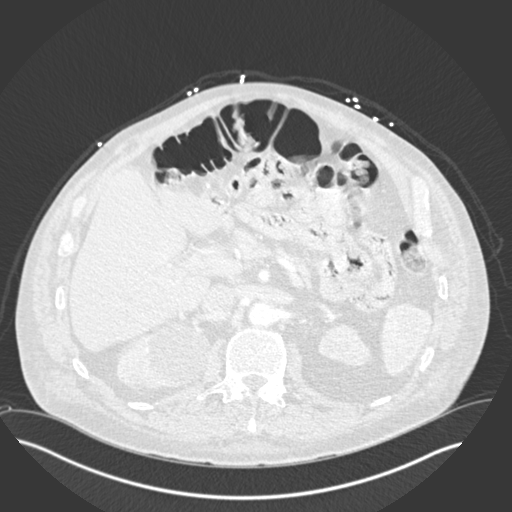
[im 48/367  soft-tissue]
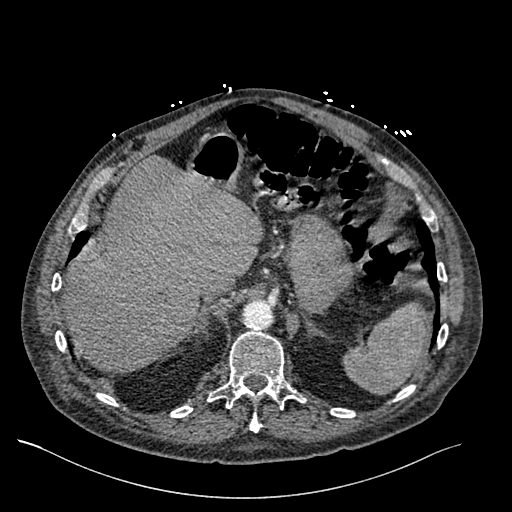
[im 64/367  lung]
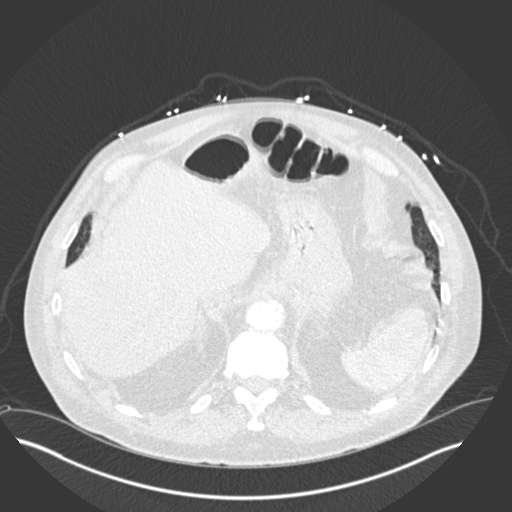
[im 96/367  soft-tissue]
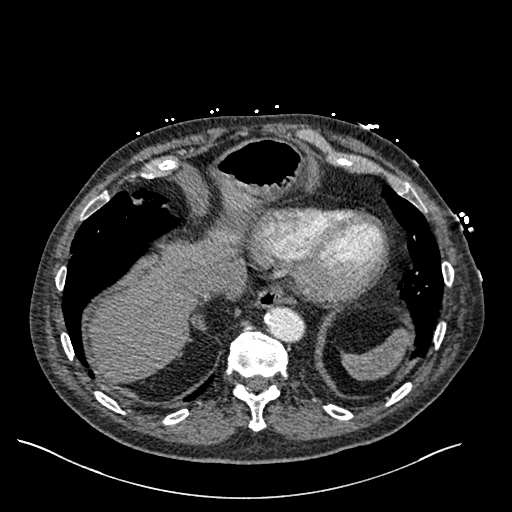
[im 128/367  lung]
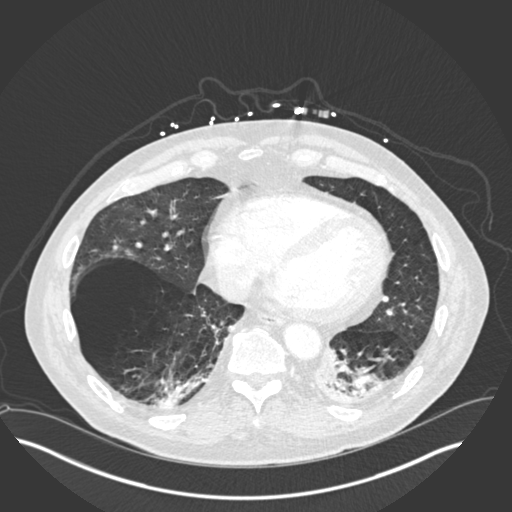
[im 144/367  soft-tissue]
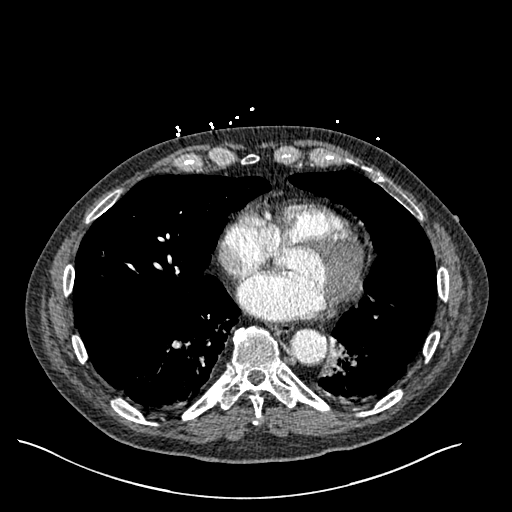
[im 176/367  lung]
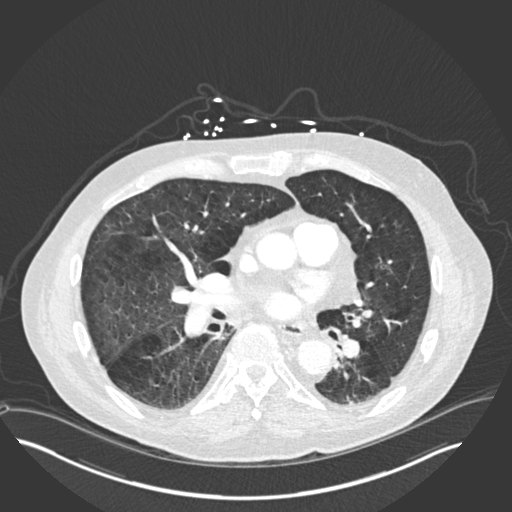
[im 191/367  soft-tissue]
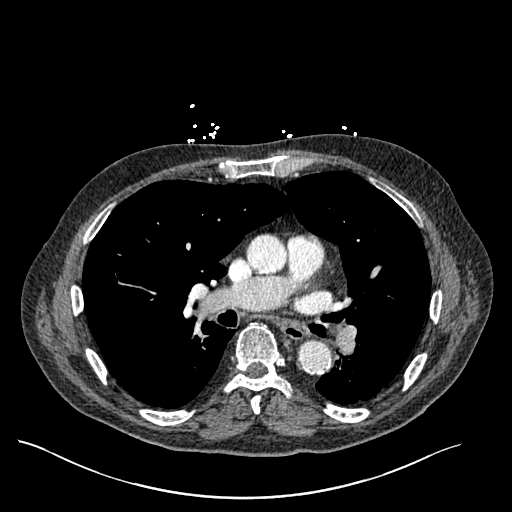
[im 223/367  lung]
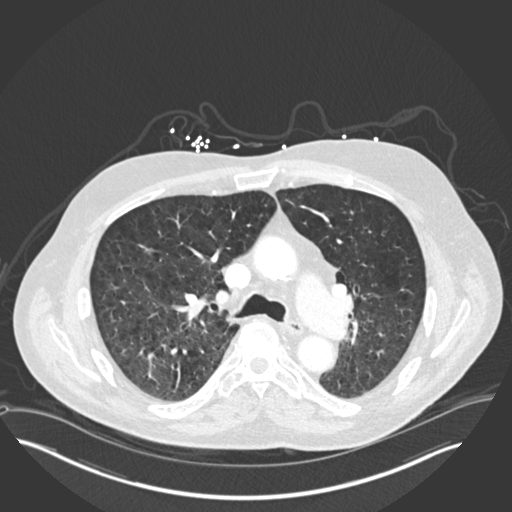
[im 239/367  soft-tissue]
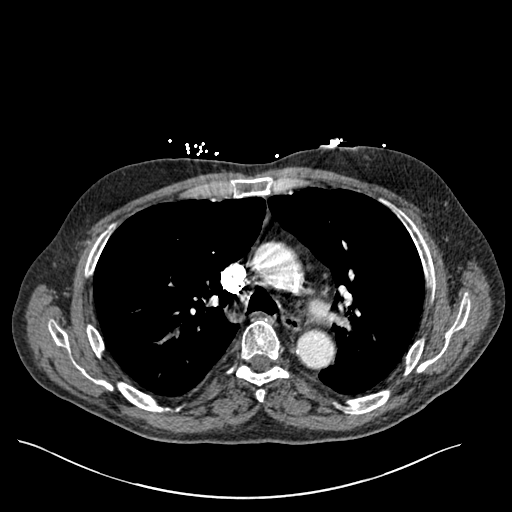
[im 271/367  lung]
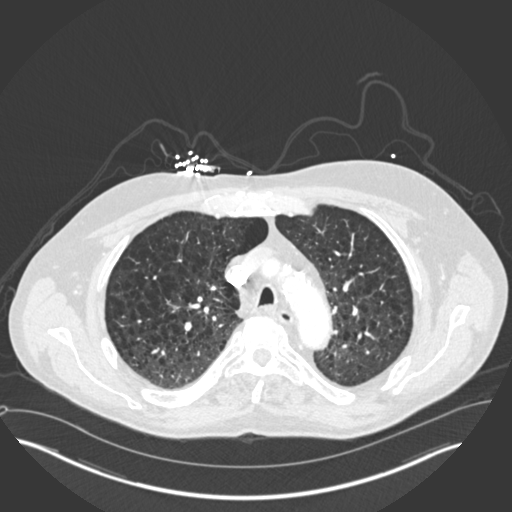
[im 303/367  soft-tissue]
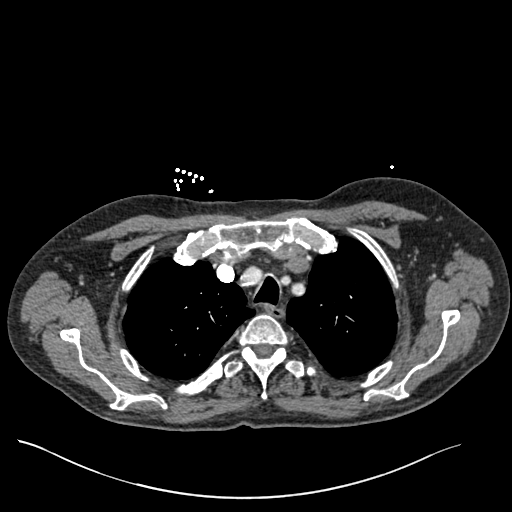
[im 319/367  lung]
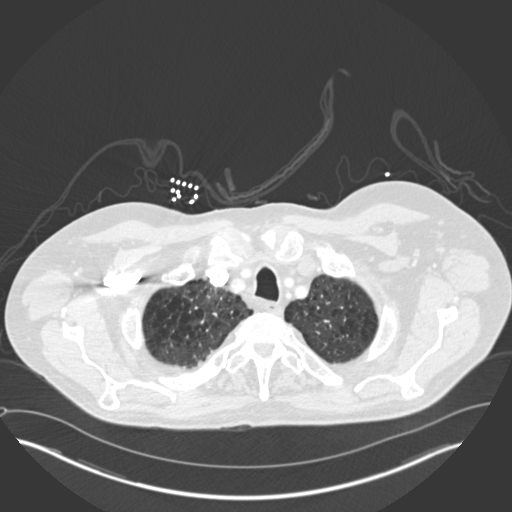
[im 351/367  soft-tissue]
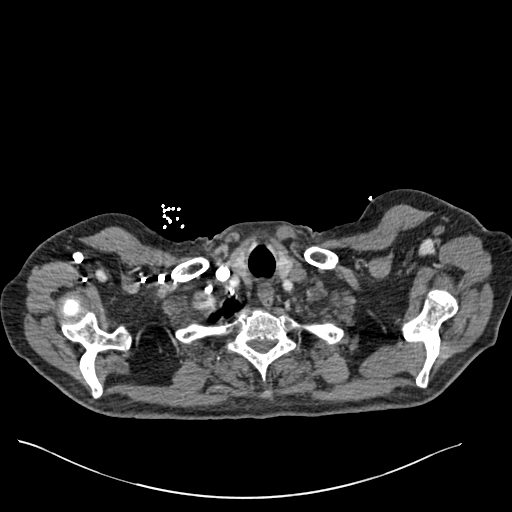

[Series 7: coronal mpr · coronal · 0.76mm/px · 3 of 159 slices shown]
[im 40/159  soft-tissue]
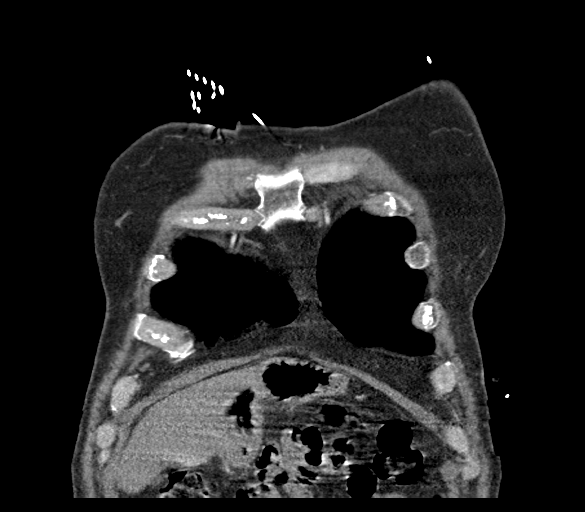
[im 80/159  soft-tissue]
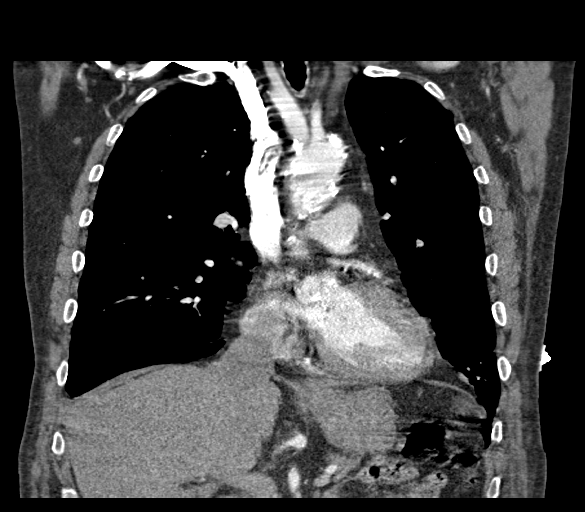
[im 119/159  soft-tissue]
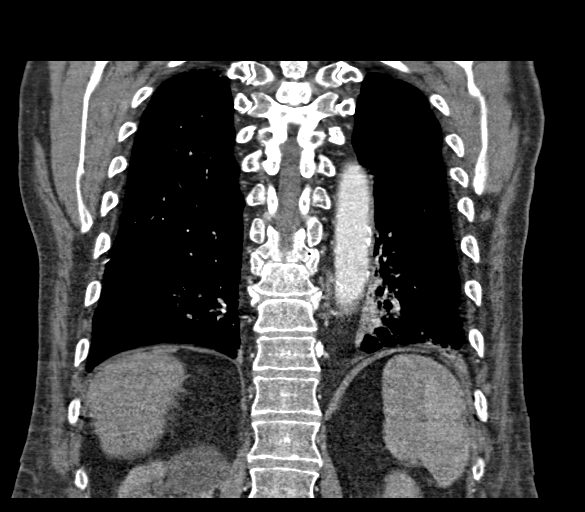

[17 of 46 positions shown; findings below may reference images not displayed]

FINDINGS: Evaluation of this exam is limited due to respiratory motion
artifact.

Cardiovascular: There is no cardiomegaly or pericardial effusion.
Multi vessel coronary vascular calcification. There is moderate
atherosclerotic calcification of the thoracic aorta. No aneurysmal
dilatation or evidence of dissection. The origins of the great
vessels of the aortic arch appear patent as visualized. Evaluation
of the pulmonary arteries is limited due to respiratory motion
artifact as well as suboptimal opacification and timing of the
contrast. No definite large or central pulmonary artery embolus
identified.

Mediastinum/Nodes: There is no hilar or mediastinal adenopathy. The
esophagus and the thyroid gland are grossly unremarkable. No
mediastinal fluid collection.

Lungs/Pleura: Moderate to severe emphysema with bullous changes of
the right lung base. Bibasilar streaky and subsegmental densities
noted. Overall interval decrease in the consolidative changes of the
left lower lobe. This may represent atelectatic changes/scarring,
although residual or recurrent pneumonia is not excluded. Clinical
correlation is recommended. There is no pleural effusion or
pneumothorax. A 9 mm focal nodularity in the right middle lobe
(series 11, image 136) appears similar to prior CT and may represent
an area of scarring. Attention on follow-up imaging recommended. The
central airways are patent.

Upper Abdomen: A most consistent with a cyst. 4.7 cm hypodense
lesion from the upper pole of the right kidney

Musculoskeletal: Degenerative changes of the spine. No acute osseous
pathology.

Review of the MIP images confirms the above findings.
IMPRESSION: 1. No definite large or central pulmonary artery embolus on limited
study.
2. Moderate to severe emphysema with bullous changes of the right
lung base.
3. Bibasilar streaky and subsegmental densities may represent
atelectasis/scarring versus residual or recurrent pneumonia.
Clinical correlation is recommended. Overall interval improvement of
the aeration of the left lower lobe with decrease in the size of
consolidative area since the prior CT
4. A 9 mm focal nodularity in the right middle lobe appears similar
to prior CT and may represent scarring. Attention on follow-up
imaging recommended.
5. Aortic Atherosclerosis (E255Z-MJW.W) and Emphysema (E255Z-CZL.Z).

## 2020-05-18 ENCOUNTER — Telehealth: Payer: Self-pay | Admitting: Hospice

## 2020-05-18 DIAGNOSIS — J449 Chronic obstructive pulmonary disease, unspecified: Secondary | ICD-10-CM | POA: Diagnosis not present

## 2020-05-18 DIAGNOSIS — U071 COVID-19: Secondary | ICD-10-CM | POA: Diagnosis not present

## 2020-05-18 DIAGNOSIS — R32 Unspecified urinary incontinence: Secondary | ICD-10-CM | POA: Diagnosis not present

## 2020-05-18 DIAGNOSIS — J9611 Chronic respiratory failure with hypoxia: Secondary | ICD-10-CM | POA: Diagnosis not present

## 2020-05-18 DIAGNOSIS — J1282 Pneumonia due to coronavirus disease 2019: Secondary | ICD-10-CM | POA: Diagnosis not present

## 2020-05-18 DIAGNOSIS — G4733 Obstructive sleep apnea (adult) (pediatric): Secondary | ICD-10-CM | POA: Diagnosis not present

## 2020-05-18 DIAGNOSIS — I10 Essential (primary) hypertension: Secondary | ICD-10-CM | POA: Diagnosis not present

## 2020-05-18 DIAGNOSIS — N401 Enlarged prostate with lower urinary tract symptoms: Secondary | ICD-10-CM | POA: Diagnosis not present

## 2020-05-18 DIAGNOSIS — M1990 Unspecified osteoarthritis, unspecified site: Secondary | ICD-10-CM | POA: Diagnosis not present

## 2020-05-18 DIAGNOSIS — E119 Type 2 diabetes mellitus without complications: Secondary | ICD-10-CM | POA: Diagnosis not present

## 2020-05-18 NOTE — Telephone Encounter (Signed)
Rec'd message that daughter had returned my call.  Returned call to daughter to schedule Palliative Consult, no answer - left message with my contact information.

## 2020-05-19 DIAGNOSIS — I10 Essential (primary) hypertension: Secondary | ICD-10-CM | POA: Diagnosis not present

## 2020-05-19 DIAGNOSIS — J1282 Pneumonia due to coronavirus disease 2019: Secondary | ICD-10-CM | POA: Diagnosis not present

## 2020-05-19 DIAGNOSIS — G4733 Obstructive sleep apnea (adult) (pediatric): Secondary | ICD-10-CM | POA: Diagnosis not present

## 2020-05-19 DIAGNOSIS — M1990 Unspecified osteoarthritis, unspecified site: Secondary | ICD-10-CM | POA: Diagnosis not present

## 2020-05-19 DIAGNOSIS — U071 COVID-19: Secondary | ICD-10-CM | POA: Diagnosis not present

## 2020-05-19 DIAGNOSIS — R32 Unspecified urinary incontinence: Secondary | ICD-10-CM | POA: Diagnosis not present

## 2020-05-19 DIAGNOSIS — J9611 Chronic respiratory failure with hypoxia: Secondary | ICD-10-CM | POA: Diagnosis not present

## 2020-05-19 DIAGNOSIS — E119 Type 2 diabetes mellitus without complications: Secondary | ICD-10-CM | POA: Diagnosis not present

## 2020-05-19 DIAGNOSIS — J449 Chronic obstructive pulmonary disease, unspecified: Secondary | ICD-10-CM | POA: Diagnosis not present

## 2020-05-19 DIAGNOSIS — N401 Enlarged prostate with lower urinary tract symptoms: Secondary | ICD-10-CM | POA: Diagnosis not present

## 2020-05-20 ENCOUNTER — Other Ambulatory Visit: Payer: Self-pay | Admitting: *Deleted

## 2020-05-20 ENCOUNTER — Encounter: Payer: Self-pay | Admitting: *Deleted

## 2020-05-20 NOTE — Patient Outreach (Signed)
[facility]) Care Management  05/20/2020  Cory Kemp [birth year] [id number]   CSW made a second attempt to try and contact patient's daughter, [name] today to discuss arranging in-home care services for patient; however, Ms. Edgin was unavailable at the time of CSW's call.  CSW left a HIPAA compliant message on voicemail for Ms. Popovich and is currently awaiting a return call.  CSW will make a third outreach attempt within the next 3-4 business days, if a return call is not received from Ms. Ditto in the meantime.  CSW will also mail a Patient Unsuccessful Outreach Letter to patient's home, encouraging patient and/or Ms. Louque to contact CSW directly if they are interested in continuing to receive social work services through [facility] with [facility].  [name], BSW, MSW, LCSW  Licensed Education officer, environmental Health System  Mailing [place] [address] [place], [place] [address] Physical Address-[address], [place], [place] [address] Toll Free Main # [phone] Fax # [phone] Cell # [phone]  [email or web address]

## 2020-05-21 DIAGNOSIS — J9611 Chronic respiratory failure with hypoxia: Secondary | ICD-10-CM | POA: Diagnosis not present

## 2020-05-21 DIAGNOSIS — N401 Enlarged prostate with lower urinary tract symptoms: Secondary | ICD-10-CM | POA: Diagnosis not present

## 2020-05-21 DIAGNOSIS — M1990 Unspecified osteoarthritis, unspecified site: Secondary | ICD-10-CM | POA: Diagnosis not present

## 2020-05-21 DIAGNOSIS — I10 Essential (primary) hypertension: Secondary | ICD-10-CM | POA: Diagnosis not present

## 2020-05-21 DIAGNOSIS — J1282 Pneumonia due to coronavirus disease 2019: Secondary | ICD-10-CM | POA: Diagnosis not present

## 2020-05-21 DIAGNOSIS — G4733 Obstructive sleep apnea (adult) (pediatric): Secondary | ICD-10-CM | POA: Diagnosis not present

## 2020-05-21 DIAGNOSIS — J449 Chronic obstructive pulmonary disease, unspecified: Secondary | ICD-10-CM | POA: Diagnosis not present

## 2020-05-21 DIAGNOSIS — U071 COVID-19: Secondary | ICD-10-CM | POA: Diagnosis not present

## 2020-05-21 DIAGNOSIS — E119 Type 2 diabetes mellitus without complications: Secondary | ICD-10-CM | POA: Diagnosis not present

## 2020-05-21 DIAGNOSIS — R32 Unspecified urinary incontinence: Secondary | ICD-10-CM | POA: Diagnosis not present

## 2020-05-22 ENCOUNTER — Other Ambulatory Visit: Payer: Self-pay | Admitting: Pulmonary Disease

## 2020-05-25 ENCOUNTER — Other Ambulatory Visit: Payer: Self-pay | Admitting: Pulmonary Disease

## 2020-05-26 ENCOUNTER — Other Ambulatory Visit: Payer: Self-pay | Admitting: *Deleted

## 2020-05-26 DIAGNOSIS — R32 Unspecified urinary incontinence: Secondary | ICD-10-CM | POA: Diagnosis not present

## 2020-05-26 DIAGNOSIS — M1990 Unspecified osteoarthritis, unspecified site: Secondary | ICD-10-CM | POA: Diagnosis not present

## 2020-05-26 DIAGNOSIS — J449 Chronic obstructive pulmonary disease, unspecified: Secondary | ICD-10-CM | POA: Diagnosis not present

## 2020-05-26 DIAGNOSIS — G4733 Obstructive sleep apnea (adult) (pediatric): Secondary | ICD-10-CM | POA: Diagnosis not present

## 2020-05-26 DIAGNOSIS — U071 COVID-19: Secondary | ICD-10-CM | POA: Diagnosis not present

## 2020-05-26 DIAGNOSIS — I10 Essential (primary) hypertension: Secondary | ICD-10-CM | POA: Diagnosis not present

## 2020-05-26 DIAGNOSIS — N401 Enlarged prostate with lower urinary tract symptoms: Secondary | ICD-10-CM | POA: Diagnosis not present

## 2020-05-26 DIAGNOSIS — J9611 Chronic respiratory failure with hypoxia: Secondary | ICD-10-CM | POA: Diagnosis not present

## 2020-05-26 DIAGNOSIS — E119 Type 2 diabetes mellitus without complications: Secondary | ICD-10-CM | POA: Diagnosis not present

## 2020-05-26 DIAGNOSIS — J1282 Pneumonia due to coronavirus disease 2019: Secondary | ICD-10-CM | POA: Diagnosis not present

## 2020-05-26 NOTE — Patient Outreach (Signed)
[name]) Care Management  05/26/2020  Cory Kemp [birth year] [id number]   CSW made a third attempt to try and contact patient's daughter, [name] today, to discuss arranging in-home care services for patient.  However, [name] was unavailable at the time of CSW's call.  CSW left a HIPAA compliant message on voicemail for [name] and is currently awaiting a return call.  CSW will make a fourth and final outreach attempt to [name] within the next 4 weeks, if a return call is not received from her in the meantime.    [name], BSW, MSW, LCSW  Licensed Education officer, environmental Health System  Mailing [place] [address], [place], [place] [address] Physical Address-[address] [place], [place] [address] Toll Free Main # [phone] Fax # [phone] Cell # [phone]  [email or web address]

## 2020-05-27 DIAGNOSIS — N401 Enlarged prostate with lower urinary tract symptoms: Secondary | ICD-10-CM | POA: Diagnosis not present

## 2020-05-27 DIAGNOSIS — R32 Unspecified urinary incontinence: Secondary | ICD-10-CM | POA: Diagnosis not present

## 2020-05-27 DIAGNOSIS — M1990 Unspecified osteoarthritis, unspecified site: Secondary | ICD-10-CM | POA: Diagnosis not present

## 2020-05-27 DIAGNOSIS — I10 Essential (primary) hypertension: Secondary | ICD-10-CM | POA: Diagnosis not present

## 2020-05-27 DIAGNOSIS — J9611 Chronic respiratory failure with hypoxia: Secondary | ICD-10-CM | POA: Diagnosis not present

## 2020-05-27 DIAGNOSIS — G4733 Obstructive sleep apnea (adult) (pediatric): Secondary | ICD-10-CM | POA: Diagnosis not present

## 2020-05-27 DIAGNOSIS — E119 Type 2 diabetes mellitus without complications: Secondary | ICD-10-CM | POA: Diagnosis not present

## 2020-05-27 DIAGNOSIS — U071 COVID-19: Secondary | ICD-10-CM | POA: Diagnosis not present

## 2020-05-27 DIAGNOSIS — J1282 Pneumonia due to coronavirus disease 2019: Secondary | ICD-10-CM | POA: Diagnosis not present

## 2020-05-27 DIAGNOSIS — J449 Chronic obstructive pulmonary disease, unspecified: Secondary | ICD-10-CM | POA: Diagnosis not present

## 2020-05-29 DIAGNOSIS — J9611 Chronic respiratory failure with hypoxia: Secondary | ICD-10-CM | POA: Diagnosis not present

## 2020-05-29 DIAGNOSIS — R32 Unspecified urinary incontinence: Secondary | ICD-10-CM | POA: Diagnosis not present

## 2020-05-29 DIAGNOSIS — I10 Essential (primary) hypertension: Secondary | ICD-10-CM | POA: Diagnosis not present

## 2020-05-29 DIAGNOSIS — N401 Enlarged prostate with lower urinary tract symptoms: Secondary | ICD-10-CM | POA: Diagnosis not present

## 2020-05-29 DIAGNOSIS — G4733 Obstructive sleep apnea (adult) (pediatric): Secondary | ICD-10-CM | POA: Diagnosis not present

## 2020-05-29 DIAGNOSIS — E119 Type 2 diabetes mellitus without complications: Secondary | ICD-10-CM | POA: Diagnosis not present

## 2020-05-29 DIAGNOSIS — M1990 Unspecified osteoarthritis, unspecified site: Secondary | ICD-10-CM | POA: Diagnosis not present

## 2020-05-29 DIAGNOSIS — J449 Chronic obstructive pulmonary disease, unspecified: Secondary | ICD-10-CM | POA: Diagnosis not present

## 2020-05-29 DIAGNOSIS — J1282 Pneumonia due to coronavirus disease 2019: Secondary | ICD-10-CM | POA: Diagnosis not present

## 2020-05-29 DIAGNOSIS — U071 COVID-19: Secondary | ICD-10-CM | POA: Diagnosis not present

## 2020-06-03 DIAGNOSIS — N401 Enlarged prostate with lower urinary tract symptoms: Secondary | ICD-10-CM | POA: Diagnosis not present

## 2020-06-03 DIAGNOSIS — G4733 Obstructive sleep apnea (adult) (pediatric): Secondary | ICD-10-CM | POA: Diagnosis not present

## 2020-06-03 DIAGNOSIS — J1282 Pneumonia due to coronavirus disease 2019: Secondary | ICD-10-CM | POA: Diagnosis not present

## 2020-06-03 DIAGNOSIS — J9611 Chronic respiratory failure with hypoxia: Secondary | ICD-10-CM | POA: Diagnosis not present

## 2020-06-03 DIAGNOSIS — U071 COVID-19: Secondary | ICD-10-CM | POA: Diagnosis not present

## 2020-06-03 DIAGNOSIS — E119 Type 2 diabetes mellitus without complications: Secondary | ICD-10-CM | POA: Diagnosis not present

## 2020-06-03 DIAGNOSIS — J449 Chronic obstructive pulmonary disease, unspecified: Secondary | ICD-10-CM | POA: Diagnosis not present

## 2020-06-03 DIAGNOSIS — I10 Essential (primary) hypertension: Secondary | ICD-10-CM | POA: Diagnosis not present

## 2020-06-03 DIAGNOSIS — R32 Unspecified urinary incontinence: Secondary | ICD-10-CM | POA: Diagnosis not present

## 2020-06-03 DIAGNOSIS — M1990 Unspecified osteoarthritis, unspecified site: Secondary | ICD-10-CM | POA: Diagnosis not present

## 2020-06-04 DIAGNOSIS — N401 Enlarged prostate with lower urinary tract symptoms: Secondary | ICD-10-CM | POA: Diagnosis not present

## 2020-06-04 DIAGNOSIS — J449 Chronic obstructive pulmonary disease, unspecified: Secondary | ICD-10-CM | POA: Diagnosis not present

## 2020-06-04 DIAGNOSIS — R32 Unspecified urinary incontinence: Secondary | ICD-10-CM | POA: Diagnosis not present

## 2020-06-04 DIAGNOSIS — U071 COVID-19: Secondary | ICD-10-CM | POA: Diagnosis not present

## 2020-06-04 DIAGNOSIS — J9611 Chronic respiratory failure with hypoxia: Secondary | ICD-10-CM | POA: Diagnosis not present

## 2020-06-04 DIAGNOSIS — M1990 Unspecified osteoarthritis, unspecified site: Secondary | ICD-10-CM | POA: Diagnosis not present

## 2020-06-04 DIAGNOSIS — I10 Essential (primary) hypertension: Secondary | ICD-10-CM | POA: Diagnosis not present

## 2020-06-04 DIAGNOSIS — G4733 Obstructive sleep apnea (adult) (pediatric): Secondary | ICD-10-CM | POA: Diagnosis not present

## 2020-06-04 DIAGNOSIS — E119 Type 2 diabetes mellitus without complications: Secondary | ICD-10-CM | POA: Diagnosis not present

## 2020-06-04 DIAGNOSIS — J1282 Pneumonia due to coronavirus disease 2019: Secondary | ICD-10-CM | POA: Diagnosis not present

## 2020-06-07 DIAGNOSIS — J441 Chronic obstructive pulmonary disease with (acute) exacerbation: Secondary | ICD-10-CM | POA: Diagnosis not present

## 2020-06-07 DIAGNOSIS — J449 Chronic obstructive pulmonary disease, unspecified: Secondary | ICD-10-CM | POA: Diagnosis not present

## 2020-06-09 DIAGNOSIS — G4733 Obstructive sleep apnea (adult) (pediatric): Secondary | ICD-10-CM | POA: Diagnosis not present

## 2020-06-09 DIAGNOSIS — J9611 Chronic respiratory failure with hypoxia: Secondary | ICD-10-CM | POA: Diagnosis not present

## 2020-06-09 DIAGNOSIS — I10 Essential (primary) hypertension: Secondary | ICD-10-CM | POA: Diagnosis not present

## 2020-06-09 DIAGNOSIS — N401 Enlarged prostate with lower urinary tract symptoms: Secondary | ICD-10-CM | POA: Diagnosis not present

## 2020-06-09 DIAGNOSIS — M1990 Unspecified osteoarthritis, unspecified site: Secondary | ICD-10-CM | POA: Diagnosis not present

## 2020-06-09 DIAGNOSIS — J449 Chronic obstructive pulmonary disease, unspecified: Secondary | ICD-10-CM | POA: Diagnosis not present

## 2020-06-09 DIAGNOSIS — R32 Unspecified urinary incontinence: Secondary | ICD-10-CM | POA: Diagnosis not present

## 2020-06-09 DIAGNOSIS — U071 COVID-19: Secondary | ICD-10-CM | POA: Diagnosis not present

## 2020-06-09 DIAGNOSIS — J1282 Pneumonia due to coronavirus disease 2019: Secondary | ICD-10-CM | POA: Diagnosis not present

## 2020-06-09 DIAGNOSIS — E119 Type 2 diabetes mellitus without complications: Secondary | ICD-10-CM | POA: Diagnosis not present

## 2020-06-10 DIAGNOSIS — U071 COVID-19: Secondary | ICD-10-CM | POA: Diagnosis not present

## 2020-06-10 DIAGNOSIS — N401 Enlarged prostate with lower urinary tract symptoms: Secondary | ICD-10-CM | POA: Diagnosis not present

## 2020-06-10 DIAGNOSIS — J449 Chronic obstructive pulmonary disease, unspecified: Secondary | ICD-10-CM | POA: Diagnosis not present

## 2020-06-10 DIAGNOSIS — J9611 Chronic respiratory failure with hypoxia: Secondary | ICD-10-CM | POA: Diagnosis not present

## 2020-06-10 DIAGNOSIS — M1990 Unspecified osteoarthritis, unspecified site: Secondary | ICD-10-CM | POA: Diagnosis not present

## 2020-06-10 DIAGNOSIS — J1282 Pneumonia due to coronavirus disease 2019: Secondary | ICD-10-CM | POA: Diagnosis not present

## 2020-06-10 DIAGNOSIS — I10 Essential (primary) hypertension: Secondary | ICD-10-CM | POA: Diagnosis not present

## 2020-06-10 DIAGNOSIS — G4733 Obstructive sleep apnea (adult) (pediatric): Secondary | ICD-10-CM | POA: Diagnosis not present

## 2020-06-10 DIAGNOSIS — R32 Unspecified urinary incontinence: Secondary | ICD-10-CM | POA: Diagnosis not present

## 2020-06-10 DIAGNOSIS — E119 Type 2 diabetes mellitus without complications: Secondary | ICD-10-CM | POA: Diagnosis not present

## 2020-06-11 ENCOUNTER — Other Ambulatory Visit: Payer: Self-pay | Admitting: *Deleted

## 2020-06-11 NOTE — Patient Outreach (Signed)
[facility]) Care Management  06/11/2020  Cory Kemp 07/27/1941 [id number]   Call placed to member to follow up on ongoing recovery from Covid infection.  Report he is doing much better, especially with his breathing.  State he is able to walk longer distances without feeling so short of breath.  He has been released from home health services.  This care manager inquired about follow up appointments with PCP and pulmonologist, state his daughter would have more information on office visits.  He denies any urgent concerns.  Outgoing call to daughter to confirm member is improving and inquire about appointments, no answer.  HIPAA compliant voice message left.  Will await call back from daughter, will follow up with member within the next month.  Goals Addressed              This Visit's Progress   .  [name] - Make and Keep All Appointments   Not on track     Follow Up Date 11/4   - ask family or friend for a ride - call to cancel if needed - keep a calendar with appointment dates    Why is this important?   Part of staying healthy is seeing the doctor for follow-up care.  If you forget your appointments, there are some things you can do to stay on track.    Notes:     .  [facility] - Manage Fatigue (Tiredness)   On track     Follow Up Date 11/30    - eat healthy Use Covid precautions Participate with PT    Why is this important?   Feeling tired or worn out is a common symptom of COPD (chronic obstructive pulmonary disease).  Learning when you feel your best and when you need rest is important.  Managing the tiredness (fatigue) will help you be active and enjoy life.     Notes:     .  [facility] - Recover from Covid and get back to work (pt-stated)   On track     [place] (see longitudinal plan of care for additional care plan information)  Current Barriers:  [name] Kitchen Knowledge Deficits related to COVID-19 and impact on patient self health management  Clinical  Goal(s):  [name] Kitchen Over the next 30 days, patient will verbalize basic understanding of COVID-19 impact on individual health and self health management as evidenced by verbalization of basic understanding of COVID-19 as a viral disease, measures to prevent exposure, signs and symptoms, when to contact provider . Over the next 28 days, patient will have follow up appointments with PCP and pulmonology . Over the next 28 days, patient will report understanding and adherence to covid precautions and quarantine days  Interventions: . Discussed plans with patient for ongoing care management follow up and provided patient with direct contact information for care management team . Provided patient with EMMI educational materials related to Covid recovery . Reviewed scheduled/upcoming provider appointments including:   Patient Self Care Activities:  . Attends all scheduled provider appointments . Performs ADL's independently   Update:  Resolving due to duplicate goal                   [name], Therapist, sports, MSN [place] [phone]

## 2020-06-14 ENCOUNTER — Other Ambulatory Visit: Payer: Self-pay | Admitting: Primary Care

## 2020-06-16 DIAGNOSIS — E119 Type 2 diabetes mellitus without complications: Secondary | ICD-10-CM | POA: Diagnosis not present

## 2020-06-16 DIAGNOSIS — N401 Enlarged prostate with lower urinary tract symptoms: Secondary | ICD-10-CM | POA: Diagnosis not present

## 2020-06-16 DIAGNOSIS — G4733 Obstructive sleep apnea (adult) (pediatric): Secondary | ICD-10-CM | POA: Diagnosis not present

## 2020-06-16 DIAGNOSIS — J1282 Pneumonia due to coronavirus disease 2019: Secondary | ICD-10-CM | POA: Diagnosis not present

## 2020-06-16 DIAGNOSIS — J9611 Chronic respiratory failure with hypoxia: Secondary | ICD-10-CM | POA: Diagnosis not present

## 2020-06-16 DIAGNOSIS — I10 Essential (primary) hypertension: Secondary | ICD-10-CM | POA: Diagnosis not present

## 2020-06-16 DIAGNOSIS — J449 Chronic obstructive pulmonary disease, unspecified: Secondary | ICD-10-CM | POA: Diagnosis not present

## 2020-06-16 DIAGNOSIS — M1990 Unspecified osteoarthritis, unspecified site: Secondary | ICD-10-CM | POA: Diagnosis not present

## 2020-06-16 DIAGNOSIS — R32 Unspecified urinary incontinence: Secondary | ICD-10-CM | POA: Diagnosis not present

## 2020-06-16 DIAGNOSIS — U071 COVID-19: Secondary | ICD-10-CM | POA: Diagnosis not present

## 2020-06-23 ENCOUNTER — Other Ambulatory Visit: Payer: Self-pay | Admitting: *Deleted

## 2020-06-23 ENCOUNTER — Encounter: Payer: Self-pay | Admitting: *Deleted

## 2020-06-23 NOTE — Patient Outreach (Signed)
[facility]) Care Management  06/23/2020  Cory Kemp [birth year] [id number]  CSW was able to make contact with patient today to follow-up regarding social work services and resources, as well as to ensure that patient received his 28 day supply of prepared meals through [employer], courtesy of [facility]) [place] [facility].  Patient confirmed receipt of the meals, indicating that he just takes a meal out of the freezer and pops it into the microwave when he gets too busy or is too tired to cook.  Patient was definitely most appreciative of the meals and of all services received through [facility].  Patient admitted that he is breathing much better, better than he has in the past, attributing this to him quitting smoking roughly two years ago.  Patient went on to explain that he is able to get up and move around with ease, reporting that his home health physical therapist stated to him that his gait, balance, strength and stamina are much improved.  Patient verbalized that he is enjoying life and plans to drive out [place] again very soon, where he will stay for a month or more, trying to make this trip several times a year.  CSW will perform a case closure on patient, as all goals of treatment have been met from social work standpoint and no additional social work needs have been identified at this time.  CSW will notify patient's RNCM with [facility], [name] of CSW's plans to close patient's case.  CSW will fax an update to patient's Primary Care Physician, Dr. Jani Gravel to ensure that they are aware of CSW's involvement with patient's plan of care, in addition to sending a Physician Case Closure Letter.    [name], BSW, MSW, LCSW  Licensed Education officer, environmental Health System  Mailing [place] [address] [place], [place] [address] Physical Address-[address] [place], [place] [address] Toll Free Main # [phone] Fax # [phone] Cell # [phone]  [email or web address]

## 2020-06-29 ENCOUNTER — Telehealth: Payer: Self-pay

## 2020-06-29 NOTE — Telephone Encounter (Signed)
Left message for patient's daughter [name] @ [phone] to return call and let us know if patient is still interested in a palliative care consult appointment.

## 2020-07-03 ENCOUNTER — Telehealth: Payer: Self-pay

## 2020-07-03 NOTE — Telephone Encounter (Signed)
Attempted to contact patient's daughter [name] to schedule a Palliative care consult appointment. No answer left a voicemail to return call.

## 2020-07-06 DIAGNOSIS — R69 Illness, unspecified: Secondary | ICD-10-CM | POA: Diagnosis not present

## 2020-07-07 ENCOUNTER — Telehealth: Payer: Self-pay

## 2020-07-07 DIAGNOSIS — J441 Chronic obstructive pulmonary disease with (acute) exacerbation: Secondary | ICD-10-CM | POA: Diagnosis not present

## 2020-07-07 DIAGNOSIS — J449 Chronic obstructive pulmonary disease, unspecified: Secondary | ICD-10-CM | POA: Diagnosis not present

## 2020-07-07 NOTE — Telephone Encounter (Signed)
Attempted to contact patient's daughter [name] to schedule a Palliative care consult appointment. No answer or voicemail. If no response by 12/1 will cancel referral since this is the 3rd attempt.

## 2020-07-08 ENCOUNTER — Other Ambulatory Visit: Payer: Self-pay | Admitting: *Deleted

## 2020-07-08 ENCOUNTER — Telehealth: Payer: Self-pay

## 2020-07-08 DIAGNOSIS — R69 Illness, unspecified: Secondary | ICD-10-CM | POA: Diagnosis not present

## 2020-07-08 NOTE — Telephone Encounter (Signed)
Referral canceled. No response after 3 attempts to schedule consult appointment.

## 2020-07-08 NOTE — Patient Outreach (Signed)
[name]) Care Management  07/08/2020  Cory Kemp 1941-07-16 [id number]   Outgoing call placed to member, state he is doing well.  Denies any shortness of breath, report oxygen levels have been greater than 90% (reprot range 93-94%).  He hasn't been seen by his PCP in a couple months but state his daughter is calling to schedule with a different physician in the practice (reportedly due to current PCP health issues and decreasing availability).  Member denies any urgent concerns.  Per chart, [facility] has been trying to contact daughter to schedule home visit but hasn't been successful.  Call placed to daughter, no answer, HIPAA compliant voice message left.  Will await call back, will follow up with member within the next month.  Goals Addressed              This Visit's Progress     COMPLETED: [facility] - Make and Keep All Appointments        Follow Up Date 11/4   - ask family or friend for a ride - call to cancel if needed - keep a calendar with appointment dates    Why is this important?   Part of staying healthy is seeing the doctor for follow-up care.  If you forget your appointments, there are some things you can do to stay on track.    Notes:       COMPLETED: [facility] - Manage Fatigue (Tiredness)        Follow Up Date 11/30    - eat healthy Use Covid precautions Participate with PT    Why is this important?   Feeling tired or worn out is a common symptom of COPD (chronic obstructive pulmonary disease).  Learning when you feel your best and when you need rest is important.  Managing the tiredness (fatigue) will help you be active and enjoy life.     Notes:       COMPLETED: [facility] - Recover from Covid and get back to work (pt-stated)        CARE PLAN ENTRY (see longitudinal plan of care for additional care plan information)  Current Barriers:   Knowledge Deficits related to COVID-19 and impact on patient self health management  Clinical  Goal(s):   Over the next 30 days, patient will verbalize basic understanding of COVID-19 impact on individual health and self health management as evidenced by verbalization of basic understanding of COVID-19 as a viral disease, measures to prevent exposure, signs and symptoms, when to contact provider  Over the next 28 days, patient will have follow up appointments with PCP and pulmonology  Over the next 28 days, patient will report understanding and adherence to covid precautions and quarantine days  Interventions:  Discussed plans with patient for ongoing care management follow up and provided patient with direct contact information for care management team  Provided patient with EMMI educational materials related to Covid recovery  Reviewed scheduled/upcoming provider appointments including:   Patient Self Care Activities:   Attends all scheduled provider appointments  Performs ADL's independently   Update:  Resolving due to duplicate goal                   Cory Kemp, Therapist, sports, MSN [place] Manager [phone]

## 2020-07-14 ENCOUNTER — Encounter: Payer: Self-pay | Admitting: *Deleted

## 2020-07-14 NOTE — Telephone Encounter (Signed)
This encounter was created in error - please disregard.

## 2020-08-07 DIAGNOSIS — J449 Chronic obstructive pulmonary disease, unspecified: Secondary | ICD-10-CM | POA: Diagnosis not present

## 2020-08-07 DIAGNOSIS — J441 Chronic obstructive pulmonary disease with (acute) exacerbation: Secondary | ICD-10-CM | POA: Diagnosis not present

## 2020-08-11 ENCOUNTER — Other Ambulatory Visit: Payer: Self-pay | Admitting: *Deleted

## 2020-08-11 NOTE — Patient Outreach (Signed)
[facility]) Care Management  [facility] Care Manager  08/11/2020   Cory Kemp 1940/11/26 [id number]  Call placed to member, state he is doing well. Admits that he has run out of his oxygen tanks and has been going out working over the last couple days without oxygen.  State he has been "taking it easy" in effort to recover for intermittent shortness of breath.  He has been using his inhalers to provide support and placing oxygen back on when he is back at the home.  He also has been monitoring oxygen levels throughout the day, ranging 88-94%.  Report when he is in the 80's he is using deep breathing techniques to help increase levels.  State daughter is calling today to [facility] to request refills.  Call placed to daughter to follow up on contact with [facility], no answer, HIPAA compliant voice message left. Call also placed directly to [facility], they will deliver 10 tanks by tomorrow.  Member advised of plan and agreeable.  Denies any urgent concerns, encouraged to contact this care manager with questions.    Encounter Medications:  Outpatient Encounter Medications as of 08/11/2020  Medication Sig Note  . albuterol (PROVENTIL) (2.5 MG/3ML) 0.083% nebulizer solution Take 3 mLs (2.5 mg total) by nebulization every 6 (six) hours as needed for wheezing or shortness of breath. 04/20/2020: Last Filled 04/12/2020  . arformoterol (BROVANA) 15 MCG/2ML NEBU Take 15 mcg by nebulization 2 (two) times daily.   [name] Kitchen ascorbic acid (VITAMIN C) 500 MG tablet Take 1 tablet (500 mg total) by mouth daily.   [name] Kitchen atorvastatin (LIPITOR) 40 MG tablet Take 1 tablet (40 mg total) by mouth daily.   . chlorpheniramine-HYDROcodone (TUSSIONEX) 10-8 MG/5ML SUER Take 5 mLs by mouth every 12 (twelve) hours as needed for cough.   [name] Kitchen DALIRESP 250 MCG TABS TAKE 1 TABLET BY MOUTH EVERY MORNING   . dexamethasone (DECADRON) 6 MG tablet Take 1 tablet (6 mg total) by mouth daily.   [name] Kitchen escitalopram (LEXAPRO) 10 MG tablet Take 10 mg by  mouth at bedtime.  04/20/2020: Last Filled 04/12/2020  . fluticasone (FLONASE) 50 MCG/ACT nasal spray Place 2 sprays into both nostrils daily. Reduce to 1 spray each nostril daily after stopping Afrin (Patient taking differently: Place 2 sprays into both nostrils daily as needed for allergies or rhinitis. )   . guaiFENesin-dextromethorphan (ROBITUSSIN DM) 100-10 MG/5ML syrup Take 10 mLs by mouth every 4 (four) hours as needed for cough.   [name] Kitchen ibuprofen (ADVIL) 200 MG tablet Take 400 mg by mouth at bedtime.   . Ipratropium-Albuterol (COMBIVENT RESPIMAT) 20-100 MCG/ACT AERS respimat Inhale 1 puff into the lungs every 6 (six) hours as needed for wheezing or shortness of breath.   [name] Kitchen ipratropium-albuterol (DUONEB) 0.5-2.5 (3) MG/3ML SOLN Take 3 mLs by nebulization every 4 (four) hours as needed (wheezing shortness of breath).   . losartan (COZAAR) 25 MG tablet Take 3 tablets (75 mg total) by mouth at bedtime.   . OXYGEN Inhale 4 L into the lungs continuous.   . VENTOLIN HFA 108 (90 Base) MCG/ACT inhaler INHALE 2 PUFFS INTO THE LUNGS EVERY 4 HOURS AS NEEDED FOR WHEEZING OR SHORTNESS OF BREATH   . zinc sulfate 220 (50 Zn) MG capsule Take 1 capsule (220 mg total) by mouth daily.    No facility-administered encounter medications on file as of 08/11/2020.    Functional Status:  In your present state of health, do you have any difficulty performing the following activities: 05/04/2020 04/20/2020  Hearing? Y Y  Comment Hard of hearing. -  Vision? N N  Difficulty concentrating or making decisions? N Y  Walking or climbing stairs? Y Y  Comment Weak and fatigued from COVID-19. secondary to shortness of breath and weakness  Dressing or bathing? Y Y  Comment Weak and fatigued from COVID-19. -  Doing errands, shopping? N N  Preparing Food and eating ? Y -  Comment Weak and fatigued from COVID-19. -  Using the Toilet? N -  In the past six months, have you accidently leaked urine? N -  Do you have problems with loss  of bowel control? N -  Managing your Medications? N -  Managing your Finances? N -  Housekeeping or managing your Housekeeping? Y -  Comment Weak and fatigued from COVID-19. -  Some recent data might be hidden    Fall/Depression Screening: Fall Risk  05/04/2020 05/04/2020 04/27/2020  Falls in the past year? 0 0 0  Number falls in past yr: 0 0 0  Injury with Fall? 0 0 0  Risk for fall due to : - No Fall Risks -  Risk for fall due to: Comment - - -  Follow up - Education provided;Falls prevention discussed -   PHQ 2/9 Scores 05/04/2020 05/04/2020 04/27/2020 10/17/2019 06/20/2019 02/19/2019  PHQ - 2 Score 1 1 1  0 1 1  Exception Documentation - - (No Data) - - -    Assessment:  Goals Addressed            This Visit's Progress   . [facility] - Learn and Do Breathing Exercises-COPD       Timeframe:  Long-Range Goal Priority:  High Start Date:      08/11/2020                       Expected End Date:    09/11/2020                   Follow Up Date 09/08/2020    - do breathing exercises every day - do breathing exercises at least 2 times each day - do exercises in a comfortable position that makes breathing as easy as possible    Why is this important?    Breathing exercises can help lessen the cough that comes with chronic obstructive pulmonary disease.   Doing the exercises will give you more energy.   They will also help you to control your symptoms.    Notes:     . Will Obtain DME (oxygen)       Timeframe:  Short-Term Goal Priority:  High Start Date:            08/11/2020                 Expected End Date:     08/26/2020   Called [facility] to request delivery of oxygen tank refills.                           Plan:  Follow-up:  Patient agrees to Care Plan and Follow-up.  Will follow up with member within the next month.  08/28/2020, Kemper Durie, MSN [facility] Care Management  [facility] Manager [phone]

## 2020-09-07 DIAGNOSIS — J449 Chronic obstructive pulmonary disease, unspecified: Secondary | ICD-10-CM | POA: Diagnosis not present

## 2020-09-07 DIAGNOSIS — J441 Chronic obstructive pulmonary disease with (acute) exacerbation: Secondary | ICD-10-CM | POA: Diagnosis not present

## 2020-09-08 ENCOUNTER — Other Ambulatory Visit: Payer: Self-pay | Admitting: *Deleted

## 2020-09-08 NOTE — Patient Outreach (Signed)
[facility]) Care Management  [place]  09/08/2020   Cory Kemp 03-17-1941 [id number]   Call placed to member to follow up on COPD management, state he is doing well.  Has his usual shortness of breath with activity but condition is controlled.  Admits that he did have an episode of shortness of breath where EMS was called out to the home about 2 weeks ago, state he has been fine since.  Report he is still using his oxygen and inhalers as instructed with relief.  Does not know when his upcoming appointments are, will ask his daughter.  Denies any urgent concerns, encouraged to contact this care manager with questions.    Encounter Medications:  Outpatient Encounter Medications as of 09/08/2020  Medication Sig Note  . albuterol (PROVENTIL) (2.5 MG/3ML) 0.083% nebulizer solution Take 3 mLs (2.5 mg total) by nebulization every 6 (six) hours as needed for wheezing or shortness of breath. 04/20/2020: Last Filled 04/12/2020  . arformoterol (BROVANA) 15 MCG/2ML NEBU Take 15 mcg by nebulization 2 (two) times daily.   [name] Kitchen ascorbic acid (VITAMIN C) 500 MG tablet Take 1 tablet (500 mg total) by mouth daily.   [name] Kitchen atorvastatin (LIPITOR) 40 MG tablet Take 1 tablet (40 mg total) by mouth daily.   . chlorpheniramine-HYDROcodone (TUSSIONEX) 10-8 MG/5ML SUER Take 5 mLs by mouth every 12 (twelve) hours as needed for cough.   [name] Kitchen DALIRESP 250 MCG TABS TAKE 1 TABLET BY MOUTH EVERY MORNING   . dexamethasone (DECADRON) 6 MG tablet Take 1 tablet (6 mg total) by mouth daily.   [name] Kitchen escitalopram (LEXAPRO) 10 MG tablet Take 10 mg by mouth at bedtime.  04/20/2020: Last Filled 04/12/2020  . fluticasone (FLONASE) 50 MCG/ACT nasal spray Place 2 sprays into both nostrils daily. Reduce to 1 spray each nostril daily after stopping Afrin (Patient taking differently: Place 2 sprays into both nostrils daily as needed for allergies or rhinitis. )   . guaiFENesin-dextromethorphan (ROBITUSSIN DM) 100-10 MG/5ML syrup Take  10 mLs by mouth every 4 (four) hours as needed for cough.   [name] Kitchen ibuprofen (ADVIL) 200 MG tablet Take 400 mg by mouth at bedtime.   . Ipratropium-Albuterol (COMBIVENT RESPIMAT) 20-100 MCG/ACT AERS respimat Inhale 1 puff into the lungs every 6 (six) hours as needed for wheezing or shortness of breath.   [name] Kitchen ipratropium-albuterol (DUONEB) 0.5-2.5 (3) MG/3ML SOLN Take 3 mLs by nebulization every 4 (four) hours as needed (wheezing shortness of breath).   . losartan (COZAAR) 25 MG tablet Take 3 tablets (75 mg total) by mouth at bedtime.   . OXYGEN Inhale 4 L into the lungs continuous.   . VENTOLIN HFA 108 (90 Base) MCG/ACT inhaler INHALE 2 PUFFS INTO THE LUNGS EVERY 4 HOURS AS NEEDED FOR WHEEZING OR SHORTNESS OF BREATH   . zinc sulfate 220 (50 Zn) MG capsule Take 1 capsule (220 mg total) by mouth daily.    No facility-administered encounter medications on file as of 09/08/2020.    Functional Status:  In your present state of health, do you have any difficulty performing the following activities: 05/04/2020 04/20/2020  Hearing? Y Y  Comment Hard of hearing. -  Vision? N N  Difficulty concentrating or making decisions? N Y  Walking or climbing stairs? Y Y  Comment Weak and fatigued from COVID-19. secondary to shortness of breath and weakness  Dressing or bathing? Y Y  Comment Weak and fatigued from COVID-19. -  Doing errands, shopping? N [employer]  and eating ? Y -  Comment Weak and fatigued from COVID-19. -  Using the Toilet? N -  In the past six months, have you accidently leaked urine? N -  Do you have problems with loss of bowel control? N -  Managing your Medications? N -  Managing your Finances? N -  Housekeeping or managing your Housekeeping? Y -  Comment Weak and fatigued from COVID-19. -  Some recent data might be hidden    Fall/Depression Screening: Fall Risk  05/04/2020 05/04/2020 04/27/2020  Falls in the past year? 0 0 0  Number falls in past yr: 0 0 0  Injury with Fall? 0 0 0   Risk for fall due to : - No Fall Risks -  Risk for fall due to: Comment - - -  Follow up - Education provided;Falls prevention discussed -   PHQ 2/9 Scores 05/04/2020 05/04/2020 04/27/2020 10/17/2019 06/20/2019 02/19/2019  PHQ - 2 Score 1 1 1  0 1 1  Exception Documentation - - (No Data) - - -    Assessment:  Goals Addressed            This Visit's Progress   . [facility] - Learn and Do Breathing Exercises-COPD   On track    Timeframe:  Long-Range Goal Priority:  High Start Date:      08/11/2020                       Expected End Date:    10/09/2020                   Follow Up Date 10/06/2020    - do breathing exercises every day - do breathing exercises at least 2 times each day - do exercises in a comfortable position that makes breathing as easy as possible    Why is this important?    Breathing exercises can help lessen the cough that comes with chronic obstructive pulmonary disease.   Doing the exercises will give you more energy.   They will also help you to control your symptoms.    Notes:   2/1 - Re-educated on use of oxygen, inhalers, and breathing exercises    . [facility] - Track and Manage Activity or Exercise       Timeframe:  Short-Term Goal Priority:  Medium Start Date:         09/08/2020                    Expected End Date:       10/06/2020                Follow Up Date 10/06/2020    - balance periods of rest with activity as needed - keep inhaler available during activity or exercise - use medication before exercise    Why is this important?    Staying active is good for your health.   Keeping track of how you feel during different kinds of exercise helps you and your doctor plan your care.   You should be able to enjoy being active without having an asthma flare-up.     Notes:        Plan:  Follow-up:  Patient agrees to Care Plan and Follow-up. Will follow up with member within the next month.  [name], [place], MSN [place] [phone]

## 2020-09-11 ENCOUNTER — Other Ambulatory Visit: Payer: Self-pay | Admitting: Pulmonary Disease

## 2020-09-15 ENCOUNTER — Other Ambulatory Visit: Payer: Self-pay | Admitting: Primary Care

## 2020-09-15 DIAGNOSIS — J441 Chronic obstructive pulmonary disease with (acute) exacerbation: Secondary | ICD-10-CM

## 2020-09-15 DIAGNOSIS — R0600 Dyspnea, unspecified: Secondary | ICD-10-CM

## 2020-09-19 DIAGNOSIS — J441 Chronic obstructive pulmonary disease with (acute) exacerbation: Secondary | ICD-10-CM | POA: Diagnosis not present

## 2020-10-05 DIAGNOSIS — J441 Chronic obstructive pulmonary disease with (acute) exacerbation: Secondary | ICD-10-CM | POA: Diagnosis not present

## 2020-10-05 DIAGNOSIS — J449 Chronic obstructive pulmonary disease, unspecified: Secondary | ICD-10-CM | POA: Diagnosis not present

## 2020-10-06 ENCOUNTER — Other Ambulatory Visit: Payer: Self-pay | Admitting: *Deleted

## 2020-10-06 NOTE — Patient Outreach (Signed)
[facility]) Care Management  10/06/2020  Cory Kemp 21-May-1941 [id number]   Call placed to member, no answer, unable to leave message as mailbox is full.  Call also placed to daughter, HIPAA compliant voice message left, will follow up within the next 3-4 business days.  [name], [place], MSN [place] [phone]

## 2020-10-12 ENCOUNTER — Other Ambulatory Visit: Payer: Self-pay | Admitting: *Deleted

## 2020-10-12 NOTE — Patient Outreach (Signed)
[place] [facility]) Care Management  10/12/2020  Cory Kemp 1941/03/10 [id number]   Outgoing call placed to member, state he is doing well.  Denies having to call EMS or go to ED since last month (EMS was called but he declined to go to ED).  He still has intermittent shortness of breath but state he is using medications as instructed, including rescue inhaler.  Does not feel like it works all the time, will discuss other samples with MD.  He does not have follow up with pulmonology on schedule, notified that he is past due (was to be seen in 6 months, that would have been December).  Offered to call office to schedule, declines stating he would have his daughter do it for him as she is the one that would be taking him.  He has follow up with PCP on 3/15.  No current shortness of breath or chest discomfort.  Denies any urgent concerns, encouraged to contact this care manager with questions.  Agrees to follow up within the next month.  Goals Addressed            This Visit's Progress   . [facility] - Learn and Do Breathing Exercises-COPD   On track    Timeframe:  Long-Range Goal Priority:  High Start Date:      3/7                   Expected End Date:    12/13/2020                   Follow Up Date 11/10/2020    - do breathing exercises every day - do exercises in a comfortable position that makes breathing as easy as possible    Why is this important?    Breathing exercises can help lessen the cough that comes with chronic obstructive pulmonary disease.   Doing the exercises will give you more energy.   They will also help you to control your symptoms.    Notes:   2/1 - Re-educated on use of oxygen, inhalers, and breathing exercises  3/7 - Discussed using new rescue inhaler, will talk to MD about it    . [facility] - Track and Manage Activity or Exercise       Timeframe:  Short-Term Goal Priority:  Medium Start Date:         10/13/2020                    Expected End Date:        11/13/2020                Follow Up Date 11/10/2020    - keep inhaler available during activity or exercise - use medication before exercise    Why is this important?    Staying active is good for your health.   Keeping track of how you feel during different kinds of exercise helps you and your doctor plan your care.   You should be able to enjoy being active without having an asthma flare-up.     Notes:   3/8 - Reminded to use nebulizers daily as instructed       Cory David, RN, MSN [place] [phone]

## 2020-10-13 DIAGNOSIS — I10 Essential (primary) hypertension: Secondary | ICD-10-CM | POA: Diagnosis not present

## 2020-10-13 DIAGNOSIS — R7303 Prediabetes: Secondary | ICD-10-CM | POA: Diagnosis not present

## 2020-10-13 DIAGNOSIS — E781 Pure hyperglyceridemia: Secondary | ICD-10-CM | POA: Diagnosis not present

## 2020-10-14 LAB — POCT UA - MICROALBUMIN

## 2020-10-20 DIAGNOSIS — E7800 Pure hypercholesterolemia, unspecified: Secondary | ICD-10-CM | POA: Diagnosis not present

## 2020-10-20 DIAGNOSIS — J449 Chronic obstructive pulmonary disease, unspecified: Secondary | ICD-10-CM | POA: Diagnosis not present

## 2020-10-20 DIAGNOSIS — Z0000 Encounter for general adult medical examination without abnormal findings: Secondary | ICD-10-CM | POA: Diagnosis not present

## 2020-10-20 DIAGNOSIS — R0981 Nasal congestion: Secondary | ICD-10-CM | POA: Diagnosis not present

## 2020-10-20 DIAGNOSIS — R7303 Prediabetes: Secondary | ICD-10-CM | POA: Diagnosis not present

## 2020-10-20 DIAGNOSIS — I10 Essential (primary) hypertension: Secondary | ICD-10-CM | POA: Diagnosis not present

## 2020-10-22 ENCOUNTER — Other Ambulatory Visit: Payer: Self-pay | Admitting: Primary Care

## 2020-11-05 DIAGNOSIS — J449 Chronic obstructive pulmonary disease, unspecified: Secondary | ICD-10-CM | POA: Diagnosis not present

## 2020-11-05 DIAGNOSIS — J441 Chronic obstructive pulmonary disease with (acute) exacerbation: Secondary | ICD-10-CM | POA: Diagnosis not present

## 2020-11-10 ENCOUNTER — Other Ambulatory Visit: Payer: Self-pay | Admitting: *Deleted

## 2020-11-10 NOTE — Patient Outreach (Signed)
[facility]) Care Management  11/10/2020  Cory Kemp 12/11/1940 [id number]   Outgoing call placed to member, report he is still doing well, having intermittent shortness of breath when waking up in the morning.  State this is relieved with inhalers and nebulizers.  Was seen by PCP on 3/15, still has not scheduled visit with pulmonology.  Again offered to have conference call to schedule, state he will call daughter to do it for him.  Denies any urgent concerns, encouraged to contact this care manager with questions.  Agrees to follow up within the next month.  Goals Addressed            This Visit's Progress   . [facility] - Learn and Do Breathing Exercises-COPD   On track    Timeframe:  Long-Range Goal Priority:  High Start Date:      3/7                   Expected End Date:    12/13/2020                   Follow Up Date 11/10/2020    - do breathing exercises every day - do exercises in a comfortable position that makes breathing as easy as possible    Why is this important?    Breathing exercises can help lessen the cough that comes with chronic obstructive pulmonary disease.   Doing the exercises will give you more energy.   They will also help you to control your symptoms.    Notes:   2/1 - Re-educated on use of oxygen, inhalers, and breathing exercises  3/7 - Discussed using new rescue inhaler, will talk to MD about it  4/4 - Encouraged to use inhalers/nebulizers as instructed, rinsing mouth after use    . [facility] - Make and Keep All Appointments       Timeframe:  Short-Term Goal Priority:  High Start Date:              4/4               Expected End Date:   5/4                     - ask family or friend for a ride - call to cancel if needed - keep a calendar with appointment dates    Why is this important?   Part of staying healthy is seeing the doctor for follow-up care.  If you forget your appointments, there are some things you can do to stay on  track.    Notes:   4/4 - Encouraged to call Pulmonology to schedule follow up appointment    . COMPLETED: [facility] - Track and Manage Activity or Exercise   On track    Timeframe:  Short-Term Goal Priority:  Medium Start Date:         10/13/2020                    Expected End Date:       11/13/2020                Follow Up Date 11/10/2020    - keep inhaler available during activity or exercise - use medication before exercise    Why is this important?    Staying active is good for your health.   Keeping track of how you feel during different kinds of exercise helps you  and your doctor plan your care.   You should be able to enjoy being active without having an asthma flare-up.     Notes:   3/8 - Reminded to use nebulizers daily as instructed       [name], RN, MSN [place] [facility] [phone]

## 2020-11-13 ENCOUNTER — Other Ambulatory Visit: Payer: Self-pay | Admitting: Primary Care

## 2020-11-13 DIAGNOSIS — R0600 Dyspnea, unspecified: Secondary | ICD-10-CM

## 2020-11-13 DIAGNOSIS — J441 Chronic obstructive pulmonary disease with (acute) exacerbation: Secondary | ICD-10-CM

## 2020-11-23 NOTE — Progress Notes (Signed)
@Patient  ID: Cory Kemp, male    DOB: Oct 08, 1940, 80 y.o.   MRN: [id number]  Chief Complaint  Patient presents with  . Follow-up    Requesting medication refills.     Referring provider: Jani Gravel, MD  HPI: 80 year old male. PMH significant for COPD, chronic respiratory failure with hypoxia, community-acquired pneumonia, obstructive sleep apnea, diabetes, pretension.  Patient of Dr. Vaughan Browner, last seen 05/15/2019. Maintained on Trelegy and Daliresp.   Previous [facility] pulmonary encounter: 01/29/2020 Patient presents today for regular follow-up visit/medicatoin refill.He states that his breathing is improved.  He does have a chronic productive cough which is worse in the morning.  His sputum color is clear.  He uses his albuterol rescue inhaler twice daily.  He called our office June 15 requesting refills of his Trelegy and Ventolin, prescriptions sent into pharmacy.   11/24/2020- interim hx  Patient presents today for 1 year follow-up. Accompanied by his daughter. They both states that everything is going well. He reports fatigue with walking. He is not using his Trelegy every day. He uses albuterol nebulizer 2-3 times a day. Rescue inhaler does not particular work fast enough for him, he would like to try a different brand. Denies acute symptoms of cough, chest tightness, wheezing.   Allergies  Allergen Reactions  . Adhesive [Tape] Rash    No "PLASTIC" tape!!! Patient broke out in blisters!!    Immunization History  Administered Date(s) Administered  . Influenza, High Dose Seasonal PF 05/21/2018  . PFIZER(Purple Top)SARS-COV-2 Vaccination 08/30/2019, 09/20/2019  . Pneumococcal Conjugate-13 02/02/2018  . Pneumococcal Polysaccharide-23 03/05/2019    Past Medical History:  Diagnosis Date  . Alcohol abuse   . Anemia   . Arthritis   . Asthma   . B12 deficiency 11/03/2013  . BPH (benign prostatic hypertrophy)   . COPD (chronic obstructive pulmonary disease) ([place])   . Dementia ([place])     ?  [name] Kitchen Diabetes mellitus ([place]) 01/10/2012  . Femoral hernia, right 03/27/2012  . Glucose intolerance (impaired glucose tolerance)   . Hyperlipidemia   . Hypertension   . Incisional hernias - swiss-cheese-type periumbilical 03/13/7618  . Inguinal hernia - right 01/10/2012  . Leukopenia 01/29/2013  . Monocytosis 01/29/2013  . Normochromic anemia 01/29/2013  . Osteopenia     Tobacco History: Social History   Tobacco Use  Smoking Status Former Smoker  . Packs/day: 1.00  . Years: 63.00  . Pack years: 63.00  . Types: Cigarettes  . Start date: 08/09/1955  . Quit date: 08/08/2018  . Years since quitting: 2.2  Smokeless Tobacco Never Used   Counseling given: Not Answered   Outpatient Medications Prior to Visit  Medication Sig Dispense Refill  . albuterol (PROVENTIL) (2.5 MG/3ML) 0.083% nebulizer solution Take 3 mLs (2.5 mg total) by nebulization every 6 (six) hours as needed for wheezing or shortness of breath. 75 mL 3  . ascorbic acid (VITAMIN C) 500 MG tablet Take 1 tablet (500 mg total) by mouth daily. 30 tablet 0  . atorvastatin (LIPITOR) 40 MG tablet Take 1 tablet (40 mg total) by mouth daily. 30 tablet 0  . DALIRESP 250 MCG TABS TAKE 1 TABLET BY MOUTH EVERY MORNING 28 tablet 3  . escitalopram (LEXAPRO) 10 MG tablet Take 10 mg by mouth at bedtime.     [name] Kitchen ibuprofen (ADVIL) 200 MG tablet Take 400 mg by mouth at bedtime.    [name] Kitchen losartan (COZAAR) 25 MG tablet Take 3 tablets (75 mg total) by mouth at bedtime. 90 tablet  0  . OXYGEN Inhale 4 L into the lungs continuous.    . TRELEGY ELLIPTA 100-62.5-25 MCG/INH AEPB INHALE 1 PUFF BY MOUTH EVERY DAY 60 each 3  . zinc sulfate 220 (50 Zn) MG capsule Take 1 capsule (220 mg total) by mouth daily. 30 capsule 0  . arformoterol (BROVANA) 15 MCG/2ML NEBU Take 15 mcg by nebulization 2 (two) times daily.    . VENTOLIN HFA 108 (90 Base) MCG/ACT inhaler INHALE 2 PUFFS INTO THE LUNGS EVERY 4 HOURS AS NEEDED FOR WHEEZING OR SHORTNESS OF BREATH 18 g 3  .  fluticasone (FLONASE) 50 MCG/ACT nasal spray Place 2 sprays into both nostrils daily. Reduce to 1 spray each nostril daily after stopping Afrin (Patient not taking: Reported on 11/24/2020) 16 g 2  . guaiFENesin-dextromethorphan (ROBITUSSIN DM) 100-10 MG/5ML syrup Take 10 mLs by mouth every 4 (four) hours as needed for cough. (Patient not taking: Reported on 11/24/2020) 118 mL 0  . chlorpheniramine-HYDROcodone (TUSSIONEX) 10-8 MG/5ML SUER Take 5 mLs by mouth every 12 (twelve) hours as needed for cough. (Patient not taking: Reported on 11/24/2020) 70 mL 0  . dexamethasone (DECADRON) 6 MG tablet Take 1 tablet (6 mg total) by mouth daily. (Patient not taking: Reported on 11/24/2020) 6 tablet 0  . Ipratropium-Albuterol (COMBIVENT RESPIMAT) 20-100 MCG/ACT AERS respimat Inhale 1 puff into the lungs every 6 (six) hours as needed for wheezing or shortness of breath. (Patient not taking: Reported on 11/24/2020)    . ipratropium-albuterol (DUONEB) 0.5-2.5 (3) MG/3ML SOLN Take 3 mLs by nebulization every 4 (four) hours as needed (wheezing shortness of breath). (Patient not taking: Reported on 11/24/2020)     No facility-administered medications prior to visit.    Review of Systems  Review of Systems  Constitutional: Positive for fatigue.  Respiratory: Positive for shortness of breath.     Physical Exam  BP 122/70 (BP Location: Right Arm, Cuff Size: Normal)   Pulse 88   Temp (!) 97.4 F (36.3 C) (Temporal)   Ht 6\' 1"  (1.854 m)   Wt 185 lb (83.9 kg)   SpO2 93% Comment: 2L POC  BMI 24.41 kg/m  Physical Exam Constitutional:      Appearance: Normal appearance.  HENT:     Mouth/Throat:     Comments: Deferred d/t masking Cardiovascular:     Rate and Rhythm: Normal rate and regular rhythm.  Pulmonary:     Effort: Pulmonary effort is normal.     Breath sounds: Rales present.     Comments: Fine crackles lung bases; otherwise clear  O2 [place] Skin:    General: Skin is warm and dry.  Neurological:      General: No focal deficit present.     Mental Status: He is alert and oriented to person, place, and time. Mental status is at baseline.  Psychiatric:        Mood and Affect: Mood normal.        Behavior: Behavior normal.        Thought Content: Thought content normal.        Judgment: Judgment normal.      Lab Results:  CBC    Component Value Date/Time   WBC 2.6 (L) 04/24/2020 0409   RBC 4.22 04/24/2020 0409   HGB 11.4 (L) 04/24/2020 0409   HGB 13.3 06/25/2013 0822   HCT 37.7 (L) 04/24/2020 0409   HCT 39.8 06/25/2013 0822   PLT 112 (L) 04/24/2020 0409   PLT 165 06/25/2013 0822   MCV 89.3  04/24/2020 0409   MCV 89.4 06/25/2013 0822   MCH 27.0 04/24/2020 0409   MCHC 30.2 04/24/2020 0409   RDW 14.6 04/24/2020 0409   RDW 14.3 06/25/2013 0822   LYMPHSABS 0.6 (L) 04/24/2020 0409   LYMPHSABS 1.3 06/25/2013 0822   MONOABS 0.4 04/24/2020 0409   MONOABS 0.6 06/25/2013 0822   EOSABS 0.0 04/24/2020 0409   EOSABS 0.0 06/25/2013 0822   BASOSABS 0.0 04/24/2020 0409   BASOSABS 0.0 06/25/2013 0822    BMET    Component Value Date/Time   NA 137 04/24/2020 0409   NA 140 02/20/2013 1148   K 4.9 04/24/2020 0409   K 4.1 02/20/2013 1148   CL 103 04/24/2020 0409   CO2 24 04/24/2020 0409   CO2 26 02/20/2013 1148   GLUCOSE 241 (H) 04/24/2020 0409   GLUCOSE 89 02/20/2013 1148   BUN 34 (H) 04/24/2020 0409   BUN 12.8 02/20/2013 1148   CREATININE 0.95 04/24/2020 0409   CREATININE 0.8 02/20/2013 1148   CALCIUM 8.3 (L) 04/24/2020 0409   CALCIUM 9.2 02/20/2013 1148   GFRNONAA >60 04/24/2020 0409   GFRAA >60 04/24/2020 0409    BNP    Component Value Date/Time   BNP 247.9 (H) 04/19/2020 1928    ProBNP No results found for: PROBNP  Imaging: No results found.   Assessment & Plan:   COPD (chronic obstructive pulmonary disease) (HCC) - Stable, no acute symptoms. Reports fatigue and shortness of breath with ambulation..  Inconsistent use of maintenance inhaler, using SABA 3-5  times a day. Reinforced importance of using Trelegy 100 daily. He had fine crackles to lung bases on exam. CXR in September 2021 showed probable scarring at bases, checking follow-up CXR today. Continue Daliresp 264mcg. FU in 6 months.    Chronic respiratory failure with hypoxia (HCC) - Continues to benefit from 2L supplemental oxygen    Martyn Ehrich, NP 11/24/2020

## 2020-11-24 ENCOUNTER — Encounter: Payer: Self-pay | Admitting: Primary Care

## 2020-11-24 ENCOUNTER — Ambulatory Visit: Payer: Medicare HMO | Admitting: Primary Care

## 2020-11-24 ENCOUNTER — Other Ambulatory Visit: Payer: Self-pay

## 2020-11-24 VITALS — BP 122/70 | HR 88 | Temp 97.4°F | Ht 73.0 in | Wt 185.0 lb

## 2020-11-24 DIAGNOSIS — J9611 Chronic respiratory failure with hypoxia: Secondary | ICD-10-CM

## 2020-11-24 DIAGNOSIS — J449 Chronic obstructive pulmonary disease, unspecified: Secondary | ICD-10-CM | POA: Diagnosis not present

## 2020-11-24 MED ORDER — ALBUTEROL SULFATE HFA 108 (90 BASE) MCG/ACT IN AERS: 2.0000 | INHALATION_SPRAY | Freq: Four times a day (QID) | RESPIRATORY_TRACT | 2 refills | Status: DC | PRN

## 2020-11-24 NOTE — Patient Instructions (Addendum)
Nice meeting you today Mr Adan  Recommendations - Make sure to take Trelegy one puff every day in the morning (rinse mouth after use) - You likely are over using Albuterol; use Albuterol nebulizer while at home if having shortness of breath that does not recover with rest, take Albuterol rescue inhaler with you and use if experiencing shortness of breath   Orders: - CXR (ordered)  Follow-up: - 6 months with Dr. Melvyn Novas

## 2020-11-24 NOTE — Assessment & Plan Note (Addendum)
-   Stable, no acute symptoms. Reports fatigue and shortness of breath with ambulation..  Inconsistent use of maintenance inhaler, using SABA 3-5 times a day. Reinforced importance of using Trelegy 100 daily. He had fine crackles to lung bases on exam. CXR in September 2021 showed probable scarring at bases, checking follow-up CXR today. Continue Daliresp 262mcg. FU in 6 months.

## 2020-11-24 NOTE — Assessment & Plan Note (Signed)
-   Continues to benefit from 2L supplemental oxygen

## 2020-11-26 ENCOUNTER — Ambulatory Visit (INDEPENDENT_AMBULATORY_CARE_PROVIDER_SITE_OTHER): Payer: Medicare HMO

## 2020-11-26 DIAGNOSIS — J449 Chronic obstructive pulmonary disease, unspecified: Secondary | ICD-10-CM | POA: Diagnosis not present

## 2020-11-26 DIAGNOSIS — J439 Emphysema, unspecified: Secondary | ICD-10-CM | POA: Diagnosis not present

## 2020-11-27 NOTE — Progress Notes (Signed)
CXR showed stable emphysematous changes both lungs and chronic scarring unchanged. No acute cardiopulmonary disease.

## 2020-12-05 DIAGNOSIS — J449 Chronic obstructive pulmonary disease, unspecified: Secondary | ICD-10-CM | POA: Diagnosis not present

## 2020-12-05 DIAGNOSIS — J441 Chronic obstructive pulmonary disease with (acute) exacerbation: Secondary | ICD-10-CM | POA: Diagnosis not present

## 2020-12-08 ENCOUNTER — Other Ambulatory Visit: Payer: Self-pay | Admitting: *Deleted

## 2020-12-08 NOTE — Patient Outreach (Signed)
[place] [facility]) Care Management  12/08/2020  AYVION KAVANAGH [birth year] [id number]   Outgoing call placed to member, no answer, voice mailbox full.  Will follow up within the next 3-4 business days.  [name], [place], MSN [place] [phone]

## 2020-12-11 ENCOUNTER — Other Ambulatory Visit: Payer: Self-pay | Admitting: *Deleted

## 2020-12-11 NOTE — Patient Instructions (Signed)

## 2020-12-11 NOTE — Patient Outreach (Signed)
[facility]) Care Management  [place]  12/11/2020   JAKHARI SPACE November 17, 1940 [id number]   Outreach attempt #2, successful.  Report he is doing well, continues smoking cessation techniques.  Denies shortness of breath current, denies any urgent concerns, encouraged to contact this care manager with questions.    Encounter Medications:  Outpatient Encounter Medications as of 12/11/2020  Medication Sig Note  . albuterol (PROVENTIL HFA) 108 (90 Base) MCG/ACT inhaler Inhale 2 puffs into the lungs every 6 (six) hours as needed for wheezing or shortness of breath. Interchange ok   . albuterol (PROVENTIL) (2.5 MG/3ML) 0.083% nebulizer solution Take 3 mLs (2.5 mg total) by nebulization every 6 (six) hours as needed for wheezing or shortness of breath.   [name] Kitchen ascorbic acid (VITAMIN C) 500 MG tablet Take 1 tablet (500 mg total) by mouth daily.   [name] Kitchen atorvastatin (LIPITOR) 40 MG tablet Take 1 tablet (40 mg total) by mouth daily.   [name] Kitchen DALIRESP 250 MCG TABS TAKE 1 TABLET BY MOUTH EVERY MORNING   . escitalopram (LEXAPRO) 10 MG tablet Take 10 mg by mouth at bedtime.  04/20/2020: Last Filled 04/12/2020  . fluticasone (FLONASE) 50 MCG/ACT nasal spray Place 2 sprays into both nostrils daily. Reduce to 1 spray each nostril daily after stopping Afrin (Patient not taking: Reported on 11/24/2020)   . guaiFENesin-dextromethorphan (ROBITUSSIN DM) 100-10 MG/5ML syrup Take 10 mLs by mouth every 4 (four) hours as needed for cough. (Patient not taking: Reported on 11/24/2020)   . ibuprofen (ADVIL) 200 MG tablet Take 400 mg by mouth at bedtime.   [name] Kitchen losartan (COZAAR) 25 MG tablet Take 3 tablets (75 mg total) by mouth at bedtime.   . OXYGEN Inhale 4 L into the lungs continuous.   . TRELEGY ELLIPTA 100-62.5-25 MCG/INH AEPB INHALE 1 PUFF BY MOUTH EVERY DAY   . zinc sulfate 220 (50 Zn) MG capsule Take 1 capsule (220 mg total) by mouth daily.    No facility-administered encounter medications on file as of  12/11/2020.    Functional Status:  In your present state of health, do you have any difficulty performing the following activities: 05/04/2020 04/20/2020  Hearing? Y Y  Comment Hard of hearing. -  Vision? N N  Difficulty concentrating or making decisions? N Y  Walking or climbing stairs? Y Y  Comment Weak and fatigued from COVID-19. secondary to shortness of breath and weakness  Dressing or bathing? Y Y  Comment Weak and fatigued from COVID-19. -  Doing errands, shopping? N N  Preparing Food and eating ? Y -  Comment Weak and fatigued from COVID-19. -  Using the Toilet? N -  In the past six months, have you accidently leaked urine? N -  Do you have problems with loss of bowel control? N -  Managing your Medications? N -  Managing your Finances? N -  Housekeeping or managing your Housekeeping? Y -  Comment Weak and fatigued from COVID-19. -  Some recent data might be hidden    Fall/Depression Screening: Fall Risk  05/04/2020 05/04/2020 04/27/2020  Falls in the past year? 0 0 0  Number falls in past yr: 0 0 0  Injury with Fall? 0 0 0  Risk for fall due to : - No Fall Risks -  Risk for fall due to: Comment - - -  Follow up - Education provided;Falls prevention discussed -   PHQ 2/9 Scores 05/04/2020 05/04/2020 04/27/2020 10/17/2019 06/20/2019 02/19/2019  PHQ - 2 Score  1 1 1  0 1 1  Exception Documentation - - (No Data) - - -    Assessment:  Goals Addressed            This Visit's Progress   . [facility] - Learn and Do Breathing Exercises-COPD   On track    Timeframe:  Long-Range Goal Priority:  High Start Date:      5/6               Expected End Date:    11/6             Barriers: Health Behaviors    - do breathing exercises every day - do exercises in a comfortable position that makes breathing as easy as possible    Why is this important?    Breathing exercises can help lessen the cough that comes with chronic obstructive pulmonary disease.   Doing the exercises will give  you more energy.   They will also help you to control your symptoms.    Notes:   2/1 - Re-educated on use of oxygen, inhalers, and breathing exercises  3/7 - Discussed using new rescue inhaler, will talk to MD about it  4/4 - Encouraged to use inhalers/nebulizers as instructed, rinsing mouth after use  5/6 - Reinforced instructions from pulmonology visit to use Daliresp AND Trelegy daily.      [facility] - Make and Keep All Appointments   On track    Timeframe:  Short-Term Goal Priority:  High Start Date:              5/6      Expected End Date:   8/6  Barriers: Knowledge                      - ask family or friend for a ride - call to cancel if needed - keep a calendar with appointment dates    Why is this important?   Part of staying healthy is seeing the doctor for follow-up care.  If you forget your appointments, there are some things you can do to stay on track.    Notes:   4/4 - Encouraged to call Pulmonology to schedule follow up appointment  5/6 - Confirmed pulmonology visit was completed, he is unsure when his PCP follow up is.  Encouraged to speak with his daughter to obtain these dates.       Plan:  Follow-up:  Patient agrees to Care Plan and Follow-up.  Will send education regarding COPD management.  Will follow up within the next 3 months.  [name], [place], MSN [place] [phone]

## 2020-12-12 ENCOUNTER — Other Ambulatory Visit: Payer: Self-pay | Admitting: Pulmonary Disease

## 2021-01-05 DIAGNOSIS — J441 Chronic obstructive pulmonary disease with (acute) exacerbation: Secondary | ICD-10-CM | POA: Diagnosis not present

## 2021-01-05 DIAGNOSIS — J449 Chronic obstructive pulmonary disease, unspecified: Secondary | ICD-10-CM | POA: Diagnosis not present

## 2021-01-30 ENCOUNTER — Other Ambulatory Visit: Payer: Self-pay | Admitting: Primary Care

## 2021-02-03 ENCOUNTER — Other Ambulatory Visit: Payer: Self-pay | Admitting: Primary Care

## 2021-02-04 DIAGNOSIS — J441 Chronic obstructive pulmonary disease with (acute) exacerbation: Secondary | ICD-10-CM | POA: Diagnosis not present

## 2021-02-04 DIAGNOSIS — J449 Chronic obstructive pulmonary disease, unspecified: Secondary | ICD-10-CM | POA: Diagnosis not present

## 2021-03-07 DIAGNOSIS — J441 Chronic obstructive pulmonary disease with (acute) exacerbation: Secondary | ICD-10-CM | POA: Diagnosis not present

## 2021-03-07 DIAGNOSIS — J449 Chronic obstructive pulmonary disease, unspecified: Secondary | ICD-10-CM | POA: Diagnosis not present

## 2021-03-12 ENCOUNTER — Other Ambulatory Visit: Payer: Self-pay | Admitting: *Deleted

## 2021-03-12 NOTE — Patient Outreach (Signed)
[facility]) Care Management  [place]  03/12/2021   KANTA HARTKE [birth year] [id number]   Outgoing call placed to member, state he is "alright, still above ground."  Anticipating office visit next month to see if meds will need to be changed to better manage COPD.  Report he is wearing his mask for Covid precautions.  Denies any urgent concerns, encouraged to contact this care manager with questions.    Encounter Medications:  Outpatient Encounter Medications as of 03/12/2021  Medication Sig Note   albuterol (PROVENTIL) (2.5 MG/3ML) 0.083% nebulizer solution Take 3 mLs (2.5 mg total) by nebulization every 6 (six) hours as needed for wheezing or shortness of breath.    albuterol (VENTOLIN HFA) 108 (90 Base) MCG/ACT inhaler INHALE 2 PUFFS BY MOUTH INTO THE LUNGS EVERY 6 HOURS AS NEEDED FOR WHEEZING OR SHORTNESS OF BREATH    ascorbic acid (VITAMIN C) 500 MG tablet Take 1 tablet (500 mg total) by mouth daily.    atorvastatin (LIPITOR) 40 MG tablet Take 1 tablet (40 mg total) by mouth daily.    DALIRESP 250 MCG TABS TAKE 1 TABLET BY MOUTH EVERY MORNING    escitalopram (LEXAPRO) 10 MG tablet Take 10 mg by mouth at bedtime.  04/20/2020: Last Filled 04/12/2020   fluticasone (FLONASE) 50 MCG/ACT nasal spray Place 2 sprays into both nostrils daily. Reduce to 1 spray each nostril daily after stopping Afrin (Patient not taking: Reported on 11/24/2020)    guaiFENesin-dextromethorphan (ROBITUSSIN DM) 100-10 MG/5ML syrup Take 10 mLs by mouth every 4 (four) hours as needed for cough. (Patient not taking: Reported on 11/24/2020)    ibuprofen (ADVIL) 200 MG tablet Take 400 mg by mouth at bedtime.    losartan (COZAAR) 25 MG tablet Take 3 tablets (75 mg total) by mouth at bedtime.    OXYGEN Inhale 4 L into the lungs continuous.    TRELEGY ELLIPTA 100-62.5-25 MCG/INH AEPB INHALE 1 PUFF BY MOUTH EVERY DAY    zinc sulfate 220 (50 Zn) MG capsule Take 1 capsule (220 mg total) by mouth daily.     No facility-administered encounter medications on file as of 03/12/2021.    Functional Status:  In your present state of health, do you have any difficulty performing the following activities: 05/04/2020 04/20/2020  Hearing? Y Y  Comment Hard of hearing. -  Vision? N N  Difficulty concentrating or making decisions? N Y  Walking or climbing stairs? Y Y  Comment Weak and fatigued from COVID-19. secondary to shortness of breath and weakness  Dressing or bathing? Y Y  Comment Weak and fatigued from COVID-19. -  Doing errands, shopping? N N  Preparing Food and eating ? Y -  Comment Weak and fatigued from COVID-19. -  Using the Toilet? N -  In the past six months, have you accidently leaked urine? N -  Do you have problems with loss of bowel control? N -  Managing your Medications? N -  Managing your Finances? N -  Housekeeping or managing your Housekeeping? Y -  Comment Weak and fatigued from COVID-19. -  Some recent data might be hidden    Fall/Depression Screening: Fall Risk  05/04/2020 05/04/2020 04/27/2020  Falls in the past year? 0 0 0  Number falls in past yr: 0 0 0  Injury with Fall? 0 0 0  Risk for fall due to : - No Fall Risks -  Risk for fall due to: Comment - - -  Follow up -  Education provided;Falls prevention discussed -   PHQ 2/9 Scores 05/04/2020 05/04/2020 04/27/2020 10/17/2019 06/20/2019 02/19/2019  PHQ - 2 Score '1 1 1 '$ 0 1 1  Exception Documentation - - (No Data) - - -    Assessment:   Care Plan Care Plan : COPD (Adult)  Updates made by [name], RN since 03/12/2021 12:00 AM     Problem: Disease Progression (COPD)   Priority: High     Long-Range Goal: Disease Progression Minimized or Managed evidenced by no ED visits in the next 60 days Completed 03/12/2021  Start Date: 10/13/2020  Expected End Date: 12/12/2020  Recent Progress: On track  Note:   Assessed symptom control by the frequency and type of symptoms, reliever use and activity limitation at every  encounter.   Developed and/or reviewed and reinforced use of COPD rescue (action) plan even when symptoms are controlled or infrequent.     Task: Alleviate Barriers to COPD Management Completed 03/12/2021  Due Date: 12/12/2020  Note:   Care Management Activities:    - barriers to treatment managed - medication-adherence assessment completed - rescue (action) plan reviewed    Notes:  Covid precautions discussed        Goals Addressed             This Visit's Progress    [facility] - Learn and Do Breathing Exercises-COPD   On track    Timeframe:  Long-Range Goal Priority:  High Start Date:      5/6               Expected End Date:    11/6             Barriers: Health Behaviors    - do breathing exercises every day - do exercises in a comfortable position that makes breathing as easy as possible    Why is this important?   Breathing exercises can help lessen the cough that comes with chronic obstructive pulmonary disease.  Doing the exercises will give you more energy.  They will also help you to control your symptoms.    Notes:   2/1 - Re-educated on use of oxygen, inhalers, and breathing exercises  3/7 - Discussed using new rescue inhaler, will talk to MD about it  4/4 - Encouraged to use inhalers/nebulizers as instructed, rinsing mouth after use  5/6 - Reinforced instructions from pulmonology visit to use Daliresp AND Trelegy daily.    8/5 - State he is still using inhalers and nebulizers.  Does have cough today, denies it being worse than normal.  [place] discussed Cov[place].     [facility] - Make and Keep All Appointments   On track    Timeframe:  Short-Term Goal Priority:  High Start Date:              5/6      Expected End Date:   8/6  Barriers: Knowledge                      - ask family or friend for a ride - call to cancel if needed - keep a calendar with appointment dates    Why is this important?   Part of staying  healthy is seeing the doctor for follow-up care.  If you forget your appointments, there are some things you can do to stay on track.    Notes:   4/4 - Encouraged to call Pulmonology to schedule  follow up appointment  5/6 - Confirmed pulmonology visit was completed, he is unsure when his PCP follow up is.  Encouraged to speak with his daughter to obtain these dates.  8/5 - Report having appointments with PCP in September to review medications.  Noted that follow up on October with Pulmonology was canceled, call placed to reschedule.        Plan:  Follow-up: Patient agrees to Care Plan and Follow-up. Follow-up in 1 month(s).    [name], [place], MSN [place] [phone]

## 2021-03-19 DIAGNOSIS — J441 Chronic obstructive pulmonary disease with (acute) exacerbation: Secondary | ICD-10-CM | POA: Diagnosis not present

## 2021-04-07 DIAGNOSIS — J441 Chronic obstructive pulmonary disease with (acute) exacerbation: Secondary | ICD-10-CM | POA: Diagnosis not present

## 2021-04-07 DIAGNOSIS — J449 Chronic obstructive pulmonary disease, unspecified: Secondary | ICD-10-CM | POA: Diagnosis not present

## 2021-04-08 ENCOUNTER — Other Ambulatory Visit: Payer: Self-pay | Admitting: *Deleted

## 2021-04-08 NOTE — Patient Outreach (Signed)
[place] [facility]) Care Management  04/08/2021  GABEL RAMIERZ [birth year] [id number]   Outgoing call placed to member, state he is "still above ground."  Continues to work, intermittent shortness of breath but no more than usual and relieved by using inhaler.  Denies any urgent concerns, encouraged to contact this care manager with questions.  Will transition member to [employer] for ongoing DM, will notify PCP of transition.   Goals Addressed             This Visit's Progress    [facility] - Learn and Do Breathing Exercises-COPD   On track    Timeframe:  Long-Range Goal Priority:  High Start Date:      5/6               Expected End Date:    11/6             Barriers: Health Behaviors    - do breathing exercises every day - do exercises in a comfortable position that makes breathing as easy as possible    Why is this important?   Breathing exercises can help lessen the cough that comes with chronic obstructive pulmonary disease.  Doing the exercises will give you more energy.  They will also help you to control your symptoms.    Notes:   2/1 - Re-educated on use of oxygen, inhalers, and breathing exercises  3/7 - Discussed using new rescue inhaler, will talk to MD about it  4/4 - Encouraged to use inhalers/nebulizers as instructed, rinsing mouth after use  5/6 - Reinforced instructions from pulmonology visit to use Daliresp AND Trelegy daily.    8/5 - State he is still using inhalers and nebulizers.  Does have cough today, denies it being worse than normal.  [place] discussed Covid precautions.  9/1 - Remains stable, will place referral to health coach for ongoing DM.     COMPLETED: [facility] - Make and Keep All Appointments       Timeframe:  Short-Term Goal Priority:  High Start Date:              5/6      Expected End Date:   8/6  Barriers: Knowledge                      - ask family or friend for a ride - call to cancel if  needed - keep a calendar with appointment dates    Why is this important?   Part of staying healthy is seeing the doctor for follow-up care.  If you forget your appointments, there are some things you can do to stay on track.    Notes:   4/4 - Encouraged to call Pulmonology to schedule follow up appointment  5/6 - Confirmed pulmonology visit was completed, he is unsure when his PCP follow up is.  Encouraged to speak with his daughter to obtain these dates.  8/5 - Report having appointments with PCP in September to review medications.  Noted that follow up on October with Pulmonology was canceled, call placed to reschedule.  9/1 - Has appointment with pharmacy team on 9/15 for medication review, daughter will provide transportation.       [name], [place], MSN [place] [phone]

## 2021-04-13 ENCOUNTER — Encounter: Payer: Self-pay | Admitting: *Deleted

## 2021-04-13 ENCOUNTER — Other Ambulatory Visit: Payer: Self-pay | Admitting: *Deleted

## 2021-04-15 DIAGNOSIS — I10 Essential (primary) hypertension: Secondary | ICD-10-CM | POA: Diagnosis not present

## 2021-04-15 DIAGNOSIS — E7800 Pure hypercholesterolemia, unspecified: Secondary | ICD-10-CM | POA: Diagnosis not present

## 2021-04-15 DIAGNOSIS — D649 Anemia, unspecified: Secondary | ICD-10-CM | POA: Diagnosis not present

## 2021-04-15 DIAGNOSIS — R7303 Prediabetes: Secondary | ICD-10-CM | POA: Diagnosis not present

## 2021-04-15 NOTE — Telephone Encounter (Signed)
This encounter was created in error - please disregard.

## 2021-04-16 ENCOUNTER — Other Ambulatory Visit: Payer: Self-pay | Admitting: Pulmonary Disease

## 2021-04-23 DIAGNOSIS — J302 Other seasonal allergic rhinitis: Secondary | ICD-10-CM | POA: Diagnosis not present

## 2021-04-23 DIAGNOSIS — I10 Essential (primary) hypertension: Secondary | ICD-10-CM | POA: Diagnosis not present

## 2021-04-23 DIAGNOSIS — Z8546 Personal history of malignant neoplasm of prostate: Secondary | ICD-10-CM | POA: Diagnosis not present

## 2021-04-23 DIAGNOSIS — R0602 Shortness of breath: Secondary | ICD-10-CM | POA: Diagnosis not present

## 2021-04-23 DIAGNOSIS — R7303 Prediabetes: Secondary | ICD-10-CM | POA: Diagnosis not present

## 2021-04-23 DIAGNOSIS — E7800 Pure hypercholesterolemia, unspecified: Secondary | ICD-10-CM | POA: Diagnosis not present

## 2021-04-23 DIAGNOSIS — Z9981 Dependence on supplemental oxygen: Secondary | ICD-10-CM | POA: Diagnosis not present

## 2021-04-23 DIAGNOSIS — J432 Centrilobular emphysema: Secondary | ICD-10-CM | POA: Diagnosis not present

## 2021-04-23 DIAGNOSIS — R69 Illness, unspecified: Secondary | ICD-10-CM | POA: Diagnosis not present

## 2021-05-07 DIAGNOSIS — J441 Chronic obstructive pulmonary disease with (acute) exacerbation: Secondary | ICD-10-CM | POA: Diagnosis not present

## 2021-05-07 DIAGNOSIS — J449 Chronic obstructive pulmonary disease, unspecified: Secondary | ICD-10-CM | POA: Diagnosis not present

## 2021-05-10 ENCOUNTER — Other Ambulatory Visit: Payer: Self-pay | Admitting: *Deleted

## 2021-05-10 NOTE — Patient Outreach (Signed)
[place] [facility]) Care Management  05/10/2021  Cory Kemp [birth year] [id number]   RN Health Coach attempted follow up outreach call to patient.  Patient was unavailable. Mailbox is full and not accepting messages Plan: RN will call patient again within 30 days.  [facility] [phone]

## 2021-05-12 ENCOUNTER — Ambulatory Visit: Payer: Medicare HMO | Admitting: Internal Medicine

## 2021-05-13 ENCOUNTER — Other Ambulatory Visit: Payer: Self-pay | Admitting: *Deleted

## 2021-05-17 NOTE — Patient Instructions (Signed)
Goals Addressed             This Visit's Progress    [facility] - Learn and Do Breathing Exercises-COPD   On track    Timeframe:  Long-Range Goal Priority:  High Start Date:      5/6               Expected End Date:   [id number] Follow up outreach [id number]      Barriers: Health Behaviors    - do breathing exercises every day - do exercises in a comfortable position that makes breathing as easy as possible    Why is this important?   Breathing exercises can help lessen the cough that comes with chronic obstructive pulmonary disease.  Doing the exercises will give you more energy.  They will also help you to control your symptoms.    Notes:  [id number] RN sent additional education on how to use his inhaler     [facility] - Make and Keep All Appointments   On track    Timeframe:  Long-Range Goal Priority:  High Start Date:            [id number] Expected End Date:   [id number] Follow up telephone assessment [id number] Barriers: Patient has severe shortness of breath and needs someone to come out with a wheelchair or go with him to take him in.                       - ask family or friend for a ride - call to cancel if needed - keep a calendar with appointment dates    Why is this important?   Part of staying healthy is seeing the doctor for follow-up care.  If you forget your appointments, there are some things you can do to stay on track.    Notes:  9/1 - Has appointment with pharmacy team on 9/15 for medication review, daughter will provide transportation. [id number] Patient is making arrangement with daughter to go with him to pulmonologist to help him get inside     [facility] - Manage Fatigue (Tiredness)   On track    Timeframe:  Long-Range Goal Priority:  High Start Date:     [id number]                   Expected End Date: [id number] Follow up telephone assessment [id number]                   - eat healthy Use Covid precautions Participate with PT    Why is this important?   Feeling tired  or worn out is a common symptom of COPD (chronic obstructive pulmonary disease).  Learning when you feel your best and when you need rest is important.  Managing the tiredness (fatigue) will help you be active and enjoy life.     Notes:  [id number] Patient is having to take frequent rest periods     [facility] - Track and Manage Activity or Exercise   On track    Timeframe:  Long-Range Goal Priority:  High Start Date:    [id number]                         Expected End Date:    [id number] Follow up telephone assessment [id number]               - keep inhaler available during activity or exercise - use medication before  exercise    Why is this important?   Staying active is good for your health.  Keeping track of how you feel during different kinds of exercise helps you and your doctor plan your care.  You should be able to enjoy being active without having an asthma flare-up.     Notes:  [id number] patient is having to monitor his O2 sats and take rest periods during activity     Track and Manage My Symptoms-COPD   On track    Timeframe:  Long-Range Goal Priority:  High Start Date:    08811031                        Expected End Date:     59458592           Follow Up Date 09/06/2021   - develop a rescue plan - eliminate symptom triggers at home - follow rescue plan if symptoms flare-up - keep follow-up appointments    Why is this important?   Tracking your symptoms and other information about your health helps your doctor plan your care.  Write down the symptoms, the time of day, what you were doing and what medicine you are taking.  You will soon learn how to manage your symptoms.     Notes:  [id number] Patient is trying to monitor for symptoms and is currently using rescue medications and rest for tx     Track and Manage My Triggers-COPD   On track    Timeframe:  Short-Term Goal Priority:  High Start Date:  38177116                           Expected End Date: 57903833               Follow Up Date [id number]   - limit outdoor activity during cold weather - listen for public air quality announcements every day    Why is this important?   Triggers are activities or things, like tobacco smoke or cold weather, that make your COPD (chronic obstructive pulmonary disease) flare-up.  Knowing these triggers helps you plan how to stay away from them.  When you cannot remove them, you can learn how to manage them.     Notes:  [id number] Patient is monitoring his triggers and notifying his Dr

## 2021-05-24 ENCOUNTER — Ambulatory Visit: Payer: Medicare HMO | Admitting: Internal Medicine

## 2021-05-31 ENCOUNTER — Encounter: Payer: Self-pay | Admitting: Internal Medicine

## 2021-05-31 ENCOUNTER — Ambulatory Visit: Payer: Medicare HMO | Admitting: Internal Medicine

## 2021-05-31 DIAGNOSIS — J432 Centrilobular emphysema: Secondary | ICD-10-CM

## 2021-05-31 DIAGNOSIS — J9611 Chronic respiratory failure with hypoxia: Secondary | ICD-10-CM

## 2021-05-31 NOTE — Patient Instructions (Signed)
No change in medications   Follow up is with DR Mannam in 6 months  - call sooner if needed

## 2021-05-31 NOTE — Progress Notes (Signed)
Aline August, male    DOB: Jun 04, 1941   MRN: [id number]   Brief patient profile:  80 yowm  quit smoking 08/2018  pt of Dr Matilde Bash   PFTs 01/21/16 FVC 2.44 (71%), FEV1 1.47 [41%), F/F 43, TLC 122%, RV/TLC 166%, DLCO 48% Severe obstruction with reduction in diffusion capacity. Hyperinflation with air trapping.  History of Present Illness  05/31/2021  Pulmonary/ 1st office eval/Robet Crutchfield GOLD 2 copd maint on trelegy   Chief Complaint  Patient presents with   Follow-up  Dyspnea:  room to room / rides scooter shopping  Cough: some congestion, worse with exertion/ clear mucus  Sleep: 30 degrees  no am flares  SABA use:hfa prn /  3 x daily  02  1-2 lpm does not monitor sats/ titrate  No obvious day to day or daytime variability or assoc excess/ purulent sputum or mucus plugs or hemoptysis or cp or chest tightness, subjective wheeze or overt sinus or hb symptoms.   Sleeping  without nocturnal  or early am exacerbation  of respiratory  c/o's or need for noct saba. Also denies any obvious fluctuation of symptoms with weather or environmental changes or other aggravating or alleviating factors except as outlined above   No unusual exposure hx or h/o childhood pna/ asthma or knowledge of premature birth.  Current Allergies, Complete Past Medical History, Past Surgical History, Family History, and Social History were reviewed in [employer] record.  ROS  The following are not active complaints unless bolded Hoarseness, sore throat, dysphagia, dental problems, itching, sneezing,  nasal congestion or discharge of excess mucus or purulent secretions, ear ache,   fever, chills, sweats, unintended wt loss or wt gain, classically pleuritic or exertional cp,  orthopnea pnd or arm/hand swelling  or leg swelling, presyncope, palpitations, abdominal pain, anorexia, nausea, vomiting, diarrhea  or change in bowel habits or change in bladder habits, change in stools or change in urine,  dysuria, hematuria,  rash, arthralgias, visual complaints, headache, numbness, weakness or ataxia or problems with walking/uses w/c to go out or coordination,  change in mood or  memory. Hearing loss           Past Medical History:  Diagnosis Date   Alcohol abuse    Anemia    Arthritis    Asthma    B12 deficiency 11/03/2013   BPH (benign prostatic hypertrophy)    COPD (chronic obstructive pulmonary disease) ([place])    Dementia (HCC)    ?   Diabetes mellitus ([place]) 01/10/2012   Femoral hernia, right 03/27/2012   Glucose intolerance (impaired glucose tolerance)    Hyperlipidemia    Hypertension    Incisional hernias - swiss-cheese-type periumbilical 12/15/9355   Inguinal hernia - right 01/10/2012   Leukopenia 01/29/2013   Monocytosis 01/29/2013   Normochromic anemia 01/29/2013   Osteopenia     Outpatient Medications Prior to Visit  Medication Sig Dispense Refill   albuterol (PROVENTIL) (2.5 MG/3ML) 0.083% nebulizer solution Take 3 mLs (2.5 mg total) by nebulization every 6 (six) hours as needed for wheezing or shortness of breath. 75 mL 3   albuterol (VENTOLIN HFA) 108 (90 Base) MCG/ACT inhaler INHALE 2 PUFFS BY MOUTH EVERY 6 HOURS AS NEEDED FOR WHEEZING OR SHORTNESS OF BREATH 18 g 2   ascorbic acid (VITAMIN C) 500 MG tablet Take 1 tablet (500 mg total) by mouth daily. 30 tablet 0   atorvastatin (LIPITOR) 40 MG tablet Take 1 tablet (40 mg total) by mouth daily. 30 tablet  0   DALIRESP 250 MCG TABS TAKE 1 TABLET BY MOUTH EVERY MORNING 30 tablet 3   escitalopram (LEXAPRO) 10 MG tablet Take 10 mg by mouth at bedtime.      ibuprofen (ADVIL) 200 MG tablet Take 400 mg by mouth at bedtime.     losartan (COZAAR) 25 MG tablet Take 3 tablets (75 mg total) by mouth at bedtime. 90 tablet 0   OXYGEN Inhale 4 L into the lungs continuous.     TRELEGY ELLIPTA 100-62.5-25 MCG/INH AEPB INHALE 1 PUFF BY MOUTH EVERY DAY 60 each 10   fluticasone (FLONASE) 50 MCG/ACT nasal spray Place 2 sprays into both nostrils  daily. Reduce to 1 spray each nostril daily after stopping Afrin (Patient not taking: No sig reported) 16 g 2   guaiFENesin-dextromethorphan (ROBITUSSIN DM) 100-10 MG/5ML syrup Take 10 mLs by mouth every 4 (four) hours as needed for cough. (Patient not taking: No sig reported) 118 mL 0   zinc sulfate 220 (50 Zn) MG capsule Take 1 capsule (220 mg total) by mouth daily. (Patient not taking: Reported on 05/31/2021) 30 capsule 0   No facility-administered medications prior to visit.     Objective:     BP 110/60 (BP Location: Right Arm, Patient Position: Sitting, Cuff Size: Normal)   Pulse 91   Ht 6\' 1"  (1.854 m)   Wt 194 lb (88 kg)   SpO2 92% Comment: 1L Pulsed  BMI 25.60 kg/m   SpO2: 92 % (1L Pulsed) O2 Type: Pulse O2 O2 Flow Rate (L/min): 1 L/min  W/c bound elderly wm nad / very hoh  HEENT : pt wearing mask not removed for exam due to covid -19 concerns.    NECK :  without JVD/Nodes/TM/ nl carotid upstrokes bilaterally   LUNGS: no acc muscle use,  Mod barrel  contour chest wall with bilateral  Distant bs s audible wheeze and  without cough on insp or exp maneuvers and mod  Hyperresonant  to  percussion bilaterally     CV:  RRR  no s3 or murmur or increase in P2, and no edema   ABD:  soft and nontender with pos mid insp Hoover's  in the supine position. No bruits or organomegaly appreciated, bowel sounds nl  MS:     ext warm without deformities, calf tenderness, cyanosis or clubbing No obvious joint restrictions   SKIN: warm and dry without lesions    NEURO:  alert, approp, nl sensorium with  no motor or cerebellar deficits apparent.           Assessment   No problem-specific Assessment & Plan notes found for this encounter.     Christinia Gully, MD 05/31/2021

## 2021-06-01 ENCOUNTER — Encounter: Payer: Self-pay | Admitting: Internal Medicine

## 2021-06-01 NOTE — Assessment & Plan Note (Signed)
Make sure you check your oxygen saturation  at your highest level of activity  to be sure it stays over 90% and adjust  02 flow upward to maintain this level if needed but remember to turn it back to previous settings when you stop (to conserve your supply).          Each maintenance medication was reviewed in detail including emphasizing most importantly the difference between maintenance and prns and under what circumstances the prns are to be triggered using an action plan format where appropriate.  Total time for H and P, chart review, counseling, reviewing dpi/hfa/02  device(s) and generating customized AVS unique to this office visit /pt new to me/ same day charting = 20 min

## 2021-06-01 NOTE — Assessment & Plan Note (Signed)
Quit smoking 2020 PFTs 01/21/16 FVC 2.44 (71%), FEV1 1.47 [41%), F/F 43, TLC 122%, RV/TLC 166%, DLCO 48% Severe obstruction with reduction in diffusion capacity. Hyperinflation with air trapping.   Group D in terms of symptom/risk and laba/lama/ICS  therefore appropriate rx at this point >>>  trelegy plus approp use of saba  Re SABA :  I spent extra time with pt today reviewing appropriate use of albuterol for prn use on exertion with the following points: 1) saba is for relief of sob that does not improve by walking a slower pace or resting but rather if the pt does not improve after trying this first. 2) If the pt is convinced, as many are, that saba helps recover from activity faster then it's easy to tell if this is the case by re-challenging : ie stop, take the inhaler, then p 5 minutes try the exact same activity (intensity of workload) that just caused the symptoms and see if they are substantially diminished or not after saba 3) if there is an activity that reproducibly causes the symptoms, try the saba 15 min before the activity on alternate days   If in fact the saba really does help, then fine to continue to use it prn but advised may need to look closer at the maintenance regimen being used to achieve better control of airways disease with exertion.

## 2021-06-04 NOTE — Patient Outreach (Signed)
[facility]) Care Management  [place]  06/04/2021 Late entry   TAYVIN PRESLAR 1940/11/25 [id number]  [employer] telephone call to patient.  [employer] compliance verified. Per patient his breathing is feeling off at times. He is having to monitor it closely. If his oxygen saturation is the 90's he can get out of the car and do his shopping. He is using his oxygen intermittently. He always has a cannister in his car of O2 when he goes out. Patient stated when he wakes up he takes his time. No recent falls. He is having to take frequent rest periods. He is taking his medications as per ordered. Patient has agreed to further outreach calls.   Encounter Medications:  Outpatient Encounter Medications as of 05/13/2021  Medication Sig Note   albuterol (PROVENTIL) (2.5 MG/3ML) 0.083% nebulizer solution Take 3 mLs (2.5 mg total) by nebulization every 6 (six) hours as needed for wheezing or shortness of breath.    albuterol (VENTOLIN HFA) 108 (90 Base) MCG/ACT inhaler INHALE 2 PUFFS BY MOUTH EVERY 6 HOURS AS NEEDED FOR WHEEZING OR SHORTNESS OF BREATH    ascorbic acid (VITAMIN C) 500 MG tablet Take 1 tablet (500 mg total) by mouth daily.    atorvastatin (LIPITOR) 40 MG tablet Take 1 tablet (40 mg total) by mouth daily.    DALIRESP 250 MCG TABS TAKE 1 TABLET BY MOUTH EVERY MORNING    escitalopram (LEXAPRO) 10 MG tablet Take 10 mg by mouth at bedtime.  04/20/2020: Last Filled 04/12/2020   fluticasone (FLONASE) 50 MCG/ACT nasal spray Place 2 sprays into both nostrils daily. Reduce to 1 spray each nostril daily after stopping Afrin (Patient not taking: No sig reported)    guaiFENesin-dextromethorphan (ROBITUSSIN DM) 100-10 MG/5ML syrup Take 10 mLs by mouth every 4 (four) hours as needed for cough. (Patient not taking: No sig reported)    ibuprofen (ADVIL) 200 MG tablet Take 400 mg by mouth at bedtime.    losartan (COZAAR) 25 MG tablet Take 3 tablets (75 mg total) by mouth at  bedtime.    OXYGEN Inhale 4 L into the lungs continuous.    TRELEGY ELLIPTA 100-62.5-25 MCG/INH AEPB INHALE 1 PUFF BY MOUTH EVERY DAY    zinc sulfate 220 (50 Zn) MG capsule Take 1 capsule (220 mg total) by mouth daily. (Patient not taking: Reported on 05/31/2021)    No facility-administered encounter medications on file as of 05/13/2021.    Functional Status:  No flowsheet data found.  Fall/Depression Screening: Fall Risk  05/04/2020 05/04/2020 04/27/2020  Falls in the past year? 0 0 0  Number falls in past yr: 0 0 0  Injury with Fall? 0 0 0  Risk for fall due to : - No Fall Risks -  Risk for fall due to: Comment - - -  Follow up - Education provided;Falls prevention discussed -   PHQ 2/9 Scores 05/10/2021 05/04/2020 05/04/2020 04/27/2020 10/17/2019 06/20/2019 02/19/2019  PHQ - 2 Score 1 1 1 1  0 1 1  Exception Documentation - - - (No Data) - - -    Assessment:   Care Plan There are no care plans that you recently modified to display for this patient.    Goals Addressed             This Visit's Progress    [facility] - Learn and Do Breathing Exercises-COPD   On track    Timeframe:  Long-Range Goal Priority:  High Start Date:  5/6               Expected End Date:   [id number] Follow up outreach [id number]      Barriers: Health Behaviors    - do breathing exercises every day - do exercises in a comfortable position that makes breathing as easy as possible    Why is this important?   Breathing exercises can help lessen the cough that comes with chronic obstructive pulmonary disease.  Doing the exercises will give you more energy.  They will also help you to control your symptoms.    Notes:  [id number] RN sent additional education on how to use his inhaler     [facility] - Make and Keep All Appointments   On track    Timeframe:  Long-Range Goal Priority:  High Start Date:            [id number] Expected End Date:   [id number] Follow up telephone assessment [id number] Barriers: Patient  has severe shortness of breath and needs someone to come out with a wheelchair or go with him to take him in.                       - ask family or friend for a ride - call to cancel if needed - keep a calendar with appointment dates    Why is this important?   Part of staying healthy is seeing the doctor for follow-up care.  If you forget your appointments, there are some things you can do to stay on track.    Notes:  9/1 - Has appointment with pharmacy team on 9/15 for medication review, daughter will provide transportation. [id number] Patient is making arrangement with daughter to go with him to pulmonologist to help him get inside     [facility] - Manage Fatigue (Tiredness)   On track    Timeframe:  Long-Range Goal Priority:  High Start Date:     [id number]                   Expected End Date: [id number] Follow up telephone assessment [phone]                   - eat healthy Use Covid precautions Participate with PT    Why is this important?   Feeling tired or worn out is a common symptom of COPD (chronic obstructive pulmonary disease).  Learning when you feel your best and when you need rest is important.  Managing the tiredness (fatigue) will help you be active and enjoy life.     Notes:  [id number] Patient is having to take frequent rest periods     [facility] - Track and Manage Activity or Exercise   On track    Timeframe:  Long-Range Goal Priority:  High Start Date:    [id number]                         Expected End Date:    [id number] Follow up telephone assessment [phone]               - keep inhaler available during activity or exercise - use medication before exercise    Why is this important?   Staying active is good for your health.  Keeping track of how you feel during different kinds of exercise helps you and your doctor plan your care.  You should be able to enjoy being active without having  an asthma flare-up.     Notes:  [id number] patient is having to monitor his O2 sats  and take rest periods during activity     Track and Manage My Symptoms-COPD   On track    Timeframe:  Long-Range Goal Priority:  High Start Date:    [id number]                        Expected End Date:     [id number]           Follow Up Date 09/06/2021   - develop a rescue plan - eliminate symptom triggers at home - follow rescue plan if symptoms flare-up - keep follow-up appointments    Why is this important?   Tracking your symptoms and other information about your health helps your doctor plan your care.  Write down the symptoms, the time of day, what you were doing and what medicine you are taking.  You will soon learn how to manage your symptoms.     Notes:  [id number] Patient is trying to monitor for symptoms and is currently using rescue medications and rest for tx     Track and Manage My Triggers-COPD   On track    Timeframe:  Short-Term Goal Priority:  High Start Date:  [id number]                           Expected End Date: 12751700              Follow Up Date [id number]   - limit outdoor activity during cold weather - listen for public air quality announcements every day    Why is this important?   Triggers are activities or things, like tobacco smoke or cold weather, that make your COPD (chronic obstructive pulmonary disease) flare-up.  Knowing these triggers helps you plan how to stay away from them.  When you cannot remove them, you can learn how to manage them.     Notes:  [id number] Patient is monitoring his triggers and notifying his Dr        Plan:  Follow-up: Follow-up in 3 month(s) RN sent educational information on COPD Quick tips RN sent educational material on F.A.S.T acronym for stroke RN will sent update assessment to PCP  [place] [facility] [phone]

## 2021-06-07 ENCOUNTER — Other Ambulatory Visit: Payer: Self-pay | Admitting: Pulmonary Disease

## 2021-06-07 DIAGNOSIS — J449 Chronic obstructive pulmonary disease, unspecified: Secondary | ICD-10-CM | POA: Diagnosis not present

## 2021-06-07 DIAGNOSIS — J441 Chronic obstructive pulmonary disease with (acute) exacerbation: Secondary | ICD-10-CM | POA: Diagnosis not present

## 2021-06-29 ENCOUNTER — Other Ambulatory Visit: Payer: Self-pay | Admitting: Pulmonary Disease

## 2021-07-07 DIAGNOSIS — J449 Chronic obstructive pulmonary disease, unspecified: Secondary | ICD-10-CM | POA: Diagnosis not present

## 2021-07-07 DIAGNOSIS — J441 Chronic obstructive pulmonary disease with (acute) exacerbation: Secondary | ICD-10-CM | POA: Diagnosis not present

## 2021-07-11 ENCOUNTER — Emergency Department (HOSPITAL_COMMUNITY): Payer: Medicare HMO

## 2021-07-11 ENCOUNTER — Inpatient Hospital Stay (HOSPITAL_COMMUNITY)
Admission: EM | Admit: 2021-07-11 | Discharge: 2021-07-16 | DRG: 190 | Disposition: A | Discharge status: Home Health | Payer: Medicare HMO | Attending: Internal Medicine | Admitting: Internal Medicine

## 2021-07-11 DIAGNOSIS — J9621 Acute and chronic respiratory failure with hypoxia: Secondary | ICD-10-CM | POA: Diagnosis not present

## 2021-07-11 DIAGNOSIS — R0602 Shortness of breath: Secondary | ICD-10-CM | POA: Diagnosis not present

## 2021-07-11 DIAGNOSIS — N400 Enlarged prostate without lower urinary tract symptoms: Secondary | ICD-10-CM | POA: Diagnosis present

## 2021-07-11 DIAGNOSIS — E538 Deficiency of other specified B group vitamins: Secondary | ICD-10-CM | POA: Diagnosis present

## 2021-07-11 DIAGNOSIS — G8929 Other chronic pain: Secondary | ICD-10-CM | POA: Insufficient documentation

## 2021-07-11 DIAGNOSIS — J441 Chronic obstructive pulmonary disease with (acute) exacerbation: Secondary | ICD-10-CM | POA: Diagnosis not present

## 2021-07-11 DIAGNOSIS — I10 Essential (primary) hypertension: Secondary | ICD-10-CM | POA: Diagnosis present

## 2021-07-11 DIAGNOSIS — D72819 Decreased white blood cell count, unspecified: Secondary | ICD-10-CM | POA: Diagnosis present

## 2021-07-11 DIAGNOSIS — Z801 Family history of malignant neoplasm of trachea, bronchus and lung: Secondary | ICD-10-CM

## 2021-07-11 DIAGNOSIS — I471 Supraventricular tachycardia, unspecified: Secondary | ICD-10-CM | POA: Diagnosis present

## 2021-07-11 DIAGNOSIS — Z8616 Personal history of COVID-19: Secondary | ICD-10-CM

## 2021-07-11 DIAGNOSIS — R0902 Hypoxemia: Secondary | ICD-10-CM | POA: Diagnosis not present

## 2021-07-11 DIAGNOSIS — Z7951 Long term (current) use of inhaled steroids: Secondary | ICD-10-CM

## 2021-07-11 DIAGNOSIS — R0600 Dyspnea, unspecified: Secondary | ICD-10-CM

## 2021-07-11 DIAGNOSIS — Z833 Family history of diabetes mellitus: Secondary | ICD-10-CM

## 2021-07-11 DIAGNOSIS — E039 Hypothyroidism, unspecified: Secondary | ICD-10-CM | POA: Diagnosis present

## 2021-07-11 DIAGNOSIS — R000 Tachycardia, unspecified: Secondary | ICD-10-CM

## 2021-07-11 DIAGNOSIS — J439 Emphysema, unspecified: Principal | ICD-10-CM | POA: Diagnosis present

## 2021-07-11 DIAGNOSIS — Z79899 Other long term (current) drug therapy: Secondary | ICD-10-CM

## 2021-07-11 DIAGNOSIS — Z9981 Dependence on supplemental oxygen: Secondary | ICD-10-CM

## 2021-07-11 DIAGNOSIS — J449 Chronic obstructive pulmonary disease, unspecified: Secondary | ICD-10-CM | POA: Diagnosis not present

## 2021-07-11 DIAGNOSIS — E119 Type 2 diabetes mellitus without complications: Secondary | ICD-10-CM | POA: Diagnosis present

## 2021-07-11 DIAGNOSIS — F0390 Unspecified dementia without behavioral disturbance: Secondary | ICD-10-CM | POA: Diagnosis present

## 2021-07-11 DIAGNOSIS — Z8546 Personal history of malignant neoplasm of prostate: Secondary | ICD-10-CM

## 2021-07-11 DIAGNOSIS — J9611 Chronic respiratory failure with hypoxia: Secondary | ICD-10-CM

## 2021-07-11 DIAGNOSIS — Z87891 Personal history of nicotine dependence: Secondary | ICD-10-CM

## 2021-07-11 DIAGNOSIS — E7800 Pure hypercholesterolemia, unspecified: Secondary | ICD-10-CM | POA: Diagnosis present

## 2021-07-11 LAB — CBC WITH DIFFERENTIAL/PLATELET
Abs Immature Granulocytes: 0.03 10*3/uL (ref 0.00–0.07)
Basophils Absolute: 0 10*3/uL (ref 0.0–0.1)
Basophils Relative: 0 %
Eosinophils Absolute: 0 10*3/uL (ref 0.0–0.5)
Eosinophils Relative: 1 %
HCT: 43.4 % (ref 39.0–52.0)
Hemoglobin: 13.7 g/dL (ref 13.0–17.0)
Immature Granulocytes: 1 %
Lymphocytes Relative: 20 %
Lymphs Abs: 0.5 10*3/uL — ABNORMAL LOW (ref 0.7–4.0)
MCH: 28.7 pg (ref 26.0–34.0)
MCHC: 31.6 g/dL (ref 30.0–36.0)
MCV: 91 fL (ref 80.0–100.0)
Monocytes Absolute: 0.8 10*3/uL (ref 0.1–1.0)
Monocytes Relative: 31 %
Neutro Abs: 1.3 10*3/uL — ABNORMAL LOW (ref 1.7–7.7)
Neutrophils Relative %: 47 %
Platelets: 89 10*3/uL — ABNORMAL LOW (ref 150–400)
RBC: 4.77 MIL/uL (ref 4.22–5.81)
RDW: 15 % (ref 11.5–15.5)
WBC: 2.7 10*3/uL — ABNORMAL LOW (ref 4.0–10.5)
nRBC: 0 % (ref 0.0–0.2)

## 2021-07-11 LAB — I-STAT ARTERIAL BLOOD GAS, ED
Acid-base deficit: 3 mmol/L — ABNORMAL HIGH (ref 0.0–2.0)
Bicarbonate: 23.7 mmol/L (ref 20.0–28.0)
Calcium, Ion: 1.14 mmol/L — ABNORMAL LOW (ref 1.15–1.40)
HCT: 38 % — ABNORMAL LOW (ref 39.0–52.0)
Hemoglobin: 12.9 g/dL — ABNORMAL LOW (ref 13.0–17.0)
O2 Saturation: 99 %
Patient temperature: 99.9
Potassium: 4.3 mmol/L (ref 3.5–5.1)
Sodium: 138 mmol/L (ref 135–145)
TCO2: 25 mmol/L (ref 22–32)
pCO2 arterial: 48.5 mmHg — ABNORMAL HIGH (ref 32.0–48.0)
pH, Arterial: 7.301 — ABNORMAL LOW (ref 7.350–7.450)
pO2, Arterial: 150 mmHg — ABNORMAL HIGH (ref 83.0–108.0)

## 2021-07-11 LAB — I-STAT VENOUS BLOOD GAS, ED
Acid-base deficit: 2 mmol/L (ref 0.0–2.0)
Bicarbonate: 24.9 mmol/L (ref 20.0–28.0)
Calcium, Ion: 1.11 mmol/L — ABNORMAL LOW (ref 1.15–1.40)
HCT: 42 % (ref 39.0–52.0)
Hemoglobin: 14.3 g/dL (ref 13.0–17.0)
O2 Saturation: 97 %
Potassium: 4.2 mmol/L (ref 3.5–5.1)
Sodium: 139 mmol/L (ref 135–145)
TCO2: 26 mmol/L (ref 22–32)
pCO2, Ven: 47.4 mmHg (ref 44.0–60.0)
pH, Ven: 7.329 (ref 7.250–7.430)
pO2, Ven: 93 mmHg — ABNORMAL HIGH (ref 32.0–45.0)

## 2021-07-11 LAB — I-STAT CHEM 8, ED
BUN: 18 mg/dL (ref 8–23)
Calcium, Ion: 1.09 mmol/L — ABNORMAL LOW (ref 1.15–1.40)
Chloride: 103 mmol/L (ref 98–111)
Creatinine, Ser: 0.9 mg/dL (ref 0.61–1.24)
Glucose, Bld: 186 mg/dL — ABNORMAL HIGH (ref 70–99)
HCT: 43 % (ref 39.0–52.0)
Hemoglobin: 14.6 g/dL (ref 13.0–17.0)
Potassium: 4.2 mmol/L (ref 3.5–5.1)
Sodium: 139 mmol/L (ref 135–145)
TCO2: 25 mmol/L (ref 22–32)

## 2021-07-11 LAB — BRAIN NATRIURETIC PEPTIDE: B Natriuretic Peptide: 138 pg/mL — ABNORMAL HIGH (ref 0.0–100.0)

## 2021-07-11 LAB — LACTIC ACID, PLASMA: Lactic Acid, Venous: 1.3 mmol/L (ref 0.5–1.9)

## 2021-07-11 LAB — COMPREHENSIVE METABOLIC PANEL
ALT: 13 U/L (ref 0–44)
AST: 29 U/L (ref 15–41)
Albumin: 3.9 g/dL (ref 3.5–5.0)
Alkaline Phosphatase: 78 U/L (ref 38–126)
Anion gap: 11 (ref 5–15)
BUN: 15 mg/dL (ref 8–23)
CO2: 25 mmol/L (ref 22–32)
Calcium: 8.6 mg/dL — ABNORMAL LOW (ref 8.9–10.3)
Chloride: 102 mmol/L (ref 98–111)
Creatinine, Ser: 0.98 mg/dL (ref 0.61–1.24)
GFR, Estimated: >60 mL/min (ref >60)
Glucose, Bld: 190 mg/dL — ABNORMAL HIGH (ref 70–99)
Potassium: 4.5 mmol/L (ref 3.5–5.1)
Sodium: 138 mmol/L (ref 135–145)
Total Bilirubin: 0.9 mg/dL (ref 0.3–1.2)
Total Protein: 7 g/dL (ref 6.5–8.1)

## 2021-07-11 LAB — TROPONIN I (HIGH SENSITIVITY): Troponin I (High Sensitivity): 23 ng/L — ABNORMAL HIGH (ref <18)

## 2021-07-11 LAB — RESP PANEL BY RT-PCR (FLU A&B, COVID) ARPGX2
Influenza A by PCR: NEGATIVE
Influenza B by PCR: NEGATIVE
SARS Coronavirus 2 by RT PCR: NEGATIVE

## 2021-07-11 LAB — PROCALCITONIN: Procalcitonin: 0.23 ng/mL

## 2021-07-11 MED ORDER — PREDNISONE 20 MG PO TABS
40.0000 mg | ORAL_TABLET | Freq: Every day | ORAL | Status: DC
  Administered 2021-07-12: 40 mg via ORAL
  Filled 2021-07-11: qty 2

## 2021-07-11 MED ORDER — ALBUTEROL SULFATE (2.5 MG/3ML) 0.083% IN NEBU
2.5000 mg | INHALATION_SOLUTION | RESPIRATORY_TRACT | Status: DC | PRN
  Administered 2021-07-12 (×2): 2.5 mg via RESPIRATORY_TRACT
  Filled 2021-07-11 (×2): qty 3

## 2021-07-11 MED ORDER — DEXAMETHASONE SODIUM PHOSPHATE 10 MG/ML IJ SOLN
10.0000 mg | Freq: Once | INTRAMUSCULAR | Status: AC
  Administered 2021-07-11: 18:00:00 10 mg via INTRAVENOUS
  Filled 2021-07-11: qty 1

## 2021-07-11 MED ORDER — IPRATROPIUM-ALBUTEROL 0.5-2.5 (3) MG/3ML IN SOLN
3.0000 mL | Freq: Four times a day (QID) | RESPIRATORY_TRACT | Status: DC
  Administered 2021-07-11 – 2021-07-14 (×13): 3 mL via RESPIRATORY_TRACT
  Filled 2021-07-11 (×13): qty 3

## 2021-07-11 MED ORDER — ALBUTEROL SULFATE (2.5 MG/3ML) 0.083% IN NEBU
15.0000 mg | INHALATION_SOLUTION | Freq: Once | RESPIRATORY_TRACT | Status: AC
  Administered 2021-07-11: 18:00:00 15 mg via RESPIRATORY_TRACT
  Filled 2021-07-11: qty 18

## 2021-07-11 MED ORDER — ACETAMINOPHEN 325 MG PO TABS
650.0000 mg | ORAL_TABLET | Freq: Four times a day (QID) | ORAL | Status: DC | PRN
  Administered 2021-07-12: 650 mg via ORAL
  Filled 2021-07-11: qty 2

## 2021-07-11 MED ORDER — SODIUM CHLORIDE 0.9 % IV BOLUS
1000.0000 mL | Freq: Once | INTRAVENOUS | Status: AC
  Administered 2021-07-11: 21:00:00 1000 mL via INTRAVENOUS

## 2021-07-11 MED ORDER — IPRATROPIUM-ALBUTEROL 0.5-2.5 (3) MG/3ML IN SOLN
RESPIRATORY_TRACT | Status: AC
  Filled 2021-07-11: qty 3

## 2021-07-11 MED ORDER — UMECLIDINIUM BROMIDE 62.5 MCG/ACT IN AEPB
1.0000 | INHALATION_SPRAY | Freq: Every day | RESPIRATORY_TRACT | Status: DC
  Administered 2021-07-12 – 2021-07-16 (×5): 1 via RESPIRATORY_TRACT
  Filled 2021-07-11: qty 7

## 2021-07-11 MED ORDER — SODIUM CHLORIDE 0.9 % IV SOLN
1.0000 g | Freq: Once | INTRAVENOUS | Status: AC
  Administered 2021-07-11: 21:00:00 1 g via INTRAVENOUS

## 2021-07-11 MED ORDER — ENOXAPARIN SODIUM 40 MG/0.4ML IJ SOSY
40.0000 mg | PREFILLED_SYRINGE | INTRAMUSCULAR | Status: DC
  Administered 2021-07-11 – 2021-07-15 (×5): 40 mg via SUBCUTANEOUS
  Filled 2021-07-11 (×5): qty 0.4

## 2021-07-11 MED ORDER — MENTHOL 3 MG MT LOZG
1.0000 | LOZENGE | OROMUCOSAL | Status: DC | PRN
  Administered 2021-07-12 (×2): 3 mg via ORAL
  Filled 2021-07-11 (×3): qty 9

## 2021-07-11 MED ORDER — SODIUM CHLORIDE 0.9 % IV SOLN
500.0000 mg | Freq: Once | INTRAVENOUS | Status: AC
  Administered 2021-07-11: 22:00:00 500 mg via INTRAVENOUS
  Filled 2021-07-11: qty 500

## 2021-07-11 MED ORDER — MAGNESIUM SULFATE 2 GM/50ML IV SOLN
2.0000 g | Freq: Once | INTRAVENOUS | Status: AC
  Administered 2021-07-11: 19:00:00 2 g via INTRAVENOUS
  Filled 2021-07-11: qty 50

## 2021-07-11 MED ORDER — FLUTICASONE FUROATE-VILANTEROL 100-25 MCG/ACT IN AEPB
1.0000 | INHALATION_SPRAY | Freq: Every day | RESPIRATORY_TRACT | Status: DC
  Administered 2021-07-12 – 2021-07-16 (×5): 1 via RESPIRATORY_TRACT
  Filled 2021-07-11: qty 28

## 2021-07-11 MED ORDER — IOPAMIDOL (ISOVUE-370) INJECTION 76%
75.0000 mL | Freq: Once | INTRAVENOUS | Status: AC | PRN
  Administered 2021-07-11: 19:00:00 75 mL via INTRAVENOUS

## 2021-07-11 MED ORDER — DILTIAZEM HCL 25 MG/5ML IV SOLN
10.0000 mg | Freq: Once | INTRAVENOUS | Status: AC
  Administered 2021-07-11: 18:00:00 10 mg via INTRAVENOUS
  Filled 2021-07-11: qty 5

## 2021-07-11 MED ORDER — FLUTICASONE-UMECLIDIN-VILANT 100-62.5-25 MCG/ACT IN AEPB: 1.0000 | INHALATION_SPRAY | Freq: Every day | RESPIRATORY_TRACT | Status: DC

## 2021-07-11 MED ORDER — LOSARTAN POTASSIUM 50 MG PO TABS
75.0000 mg | ORAL_TABLET | Freq: Every day | ORAL | Status: DC
  Administered 2021-07-12: 75 mg via ORAL
  Filled 2021-07-11: qty 1

## 2021-07-11 MED ORDER — ALUM & MAG HYDROXIDE-SIMETH 200-200-20 MG/5ML PO SUSP
30.0000 mL | Freq: Once | ORAL | Status: AC
  Administered 2021-07-11: 22:00:00 30 mL via ORAL
  Filled 2021-07-11: qty 30

## 2021-07-11 MED ORDER — ESCITALOPRAM OXALATE 10 MG PO TABS: 10.0000 mg | ORAL_TABLET | Freq: Every day | ORAL | Status: DC

## 2021-07-11 MED ORDER — ATORVASTATIN CALCIUM 40 MG PO TABS
40.0000 mg | ORAL_TABLET | Freq: Every day | ORAL | Status: DC
  Administered 2021-07-12: 40 mg via ORAL
  Filled 2021-07-11: qty 1

## 2021-07-11 MED ORDER — SODIUM CHLORIDE 0.9 % IV SOLN: 2.0000 g | INTRAVENOUS | Status: DC

## 2021-07-11 MED ORDER — SODIUM CHLORIDE 0.9% FLUSH
3.0000 mL | Freq: Two times a day (BID) | INTRAVENOUS | Status: DC
  Administered 2021-07-12 – 2021-07-16 (×9): 3 mL via INTRAVENOUS

## 2021-07-11 MED ORDER — IPRATROPIUM BROMIDE 0.02 % IN SOLN
0.5000 mg | Freq: Once | RESPIRATORY_TRACT | Status: AC
  Administered 2021-07-11: 18:00:00 0.5 mg via RESPIRATORY_TRACT

## 2021-07-11 MED ORDER — ACETAMINOPHEN 650 MG RE SUPP: 650.0000 mg | Freq: Four times a day (QID) | RECTAL | Status: DC | PRN

## 2021-07-11 MED ORDER — SODIUM CHLORIDE 0.9 % IV SOLN
500.0000 mg | INTRAVENOUS | Status: AC
  Administered 2021-07-12 – 2021-07-15 (×4): 500 mg via INTRAVENOUS
  Filled 2021-07-11 (×4): qty 500

## 2021-07-11 MED ORDER — DILTIAZEM HCL-DEXTROSE 125-5 MG/125ML-% IV SOLN (PREMIX)
5.0000 mg/h | INTRAVENOUS | Status: DC
  Administered 2021-07-11: 19:00:00 5 mg/h via INTRAVENOUS
  Administered 2021-07-12: 15 mg/h via INTRAVENOUS
  Filled 2021-07-11 (×2): qty 125

## 2021-07-11 NOTE — ED Notes (Signed)
Patient transported to CT 

## 2021-07-11 NOTE — ED Triage Notes (Signed)
Pt c/o increased SOB x 1 day.  Pt had O2 sat in 60s in triage.  Hx of COPD.  97% on non-rebreather.

## 2021-07-11 NOTE — ED Provider Notes (Signed)
[facility] EMERGENCY DEPARTMENT Provider Note   CSN: [id number] Arrival date & time: 07/11/21  1752     History Chief Complaint  Patient presents with   Shortness of Breath    Cory Kemp is a 80 y.o. male.  The history is provided by the patient.  Shortness of Breath Severity:  Severe Onset quality:  Gradual Duration:  1 day Timing:  Constant Progression:  Worsening Context comment:  Hx COPD Relieved by:  Nothing Worsened by:  Nothing Ineffective treatments:  Inhaler Associated symptoms: cough   Associated symptoms: no abdominal pain, no chest pain, no ear pain, no fever, no rash, no sore throat and no vomiting       Past Medical History:  Diagnosis Date   Alcohol abuse    Anemia    Arthritis    Asthma    B12 deficiency 11/03/2013   BPH (benign prostatic hypertrophy)    COPD (chronic obstructive pulmonary disease) ([place])    Dementia ([place])    ?   Diabetes mellitus ([place]) 01/10/2012   Femoral hernia, right 03/27/2012   Glucose intolerance (impaired glucose tolerance)    Hyperlipidemia    Hypertension    Incisional hernias - swiss-cheese-type periumbilical 09/12/2351   Inguinal hernia - right 01/10/2012   Leukopenia 01/29/2013   Monocytosis 01/29/2013   Normochromic anemia 01/29/2013   Osteopenia     Patient Active Problem List   Diagnosis Date Noted   Acute on chronic respiratory failure with hypoxia ([place]) 07/11/2021   Chronic neck pain 07/11/2021   History of malignant neoplasm of prostate 07/11/2021   Oxygen dependent 07/11/2021   Pure hypercholesterolemia 07/11/2021   Elevated troponin 04/20/2020   PVC's (premature ventricular contractions) 04/20/2020   Pneumonia due to COVID-19 virus 04/20/2020   Medication management 03/05/2019   Hypertensive urgency 02/08/2019   CAP (community acquired pneumonia) 12/25/2018   Elevated d-dimer 12/25/2018   Chronic respiratory failure with hypoxia ([place]) 12/13/2018   Abnormal finding on EKG 01/03/2018    OSA (obstructive sleep apnea) 06/14/2017   Sinus tachycardia 04/11/2016   Essential hypertension 04/11/2016   COPD (chronic obstructive pulmonary disease) ([place]) 01/11/2016   B12 deficiency 11/03/2013   BPH (benign prostatic hyperplasia) 01/31/2013   Leukopenia 01/29/2013   Monocytosis 01/29/2013   Normochromic anemia 01/29/2013   Former smoker 03/27/2012   Diabetes mellitus ([place]) 01/10/2012    Past Surgical History:  Procedure Laterality Date   HEMORRHOID SURGERY     HERNIA REPAIR     INGUINAL HERNIA REPAIR  02/24/2012   Procedure: LAPAROSCOPIC INGUINAL HERNIA;  Surgeon: Adin Hector, MD;  Location: [facility];  Service: General;  Laterality: N/A;   TUMOR EXCISION     intestinal after SBO ( Benign), appendectomy   VENTRAL HERNIA REPAIR  02/24/2012   Procedure: LAPAROSCOPIC VENTRAL HERNIA;  Surgeon: Adin Hector, MD;  Location: [facility];  Service: General;  Laterality: N/A;  lysis of adhesions, excision of abdominal wall lipomae       Family History  Problem Relation Age of Onset   Lung cancer Father    Lung cancer Mother    Diabetes Brother     Social History   Tobacco Use   Smoking status: Former    Packs/day: 1.00    Years: 63.00    Pack years: 63.00    Types: Cigarettes    Start date: 08/09/1955    Quit date: 08/08/2018    Years since quitting: 2.9   Smokeless tobacco: Never  Vaping  Use   Vaping Use: Never used  Substance Use Topics   Alcohol use: No    Alcohol/week: 0.0 standard drinks   Drug use: No    Home Medications Prior to Admission medications   Medication Sig Start Date End Date Taking? Authorizing Provider  albuterol (PROVENTIL) (2.5 MG/3ML) 0.083% nebulizer solution Take 3 mLs (2.5 mg total) by nebulization every 6 (six) hours as needed for wheezing or shortness of breath. 01/03/18   Lauraine Rinne, NP  albuterol (VENTOLIN HFA) 108 (90 Base) MCG/ACT inhaler INHALE 2 PUFFS BY MOUTH EVERY 6 HOURS AS NEEDED FOR WHEEZING OR SHORTNESS OF BREATH 06/29/21    Mannam, Praveen, MD  ascorbic acid (VITAMIN C) 500 MG tablet Take 1 tablet (500 mg total) by mouth daily. 04/25/20   Allie Bossier, MD  atorvastatin (LIPITOR) 40 MG tablet Take 1 tablet (40 mg total) by mouth daily. 04/25/20   Allie Bossier, MD  DALIRESP 250 MCG TABS TAKE 1 TABLET BY MOUTH EVERY MORNING 06/07/21   Mannam, Hart Robinsons, MD  escitalopram (LEXAPRO) 10 MG tablet Take 10 mg by mouth at bedtime.  08/23/19   [provider]  fluticasone (FLONASE) 50 MCG/ACT nasal spray Place 2 sprays into both nostrils daily. Reduce to 1 spray each nostril daily after stopping Afrin Patient not taking: No sig reported 03/20/19   Mannam, Praveen, MD  guaiFENesin-dextromethorphan (ROBITUSSIN DM) 100-10 MG/5ML syrup Take 10 mLs by mouth every 4 (four) hours as needed for cough. Patient not taking: No sig reported 04/24/20   Allie Bossier, MD  ibuprofen (ADVIL) 200 MG tablet Take 400 mg by mouth at bedtime.    [provider]  losartan (COZAAR) 25 MG tablet Take 3 tablets (75 mg total) by mouth at bedtime. 04/24/20   Allie Bossier, MD  OXYGEN Inhale 4 L into the lungs continuous.    [provider]  Donnal Debar 100-62.5-25 MCG/INH AEPB INHALE 1 PUFF BY MOUTH EVERY DAY 02/01/21   Martyn Ehrich, NP  zinc sulfate 220 (50 Zn) MG capsule Take 1 capsule (220 mg total) by mouth daily. Patient not taking: Reported on 05/31/2021 04/25/20   Allie Bossier, MD    Allergies    Adhesive [tape]  Review of Systems   Review of Systems  Constitutional:  Negative for chills and fever.  HENT:  Negative for ear pain and sore throat.   Eyes:  Negative for pain and visual disturbance.  Respiratory:  Positive for cough and shortness of breath.   Cardiovascular:  Negative for chest pain and palpitations.  Gastrointestinal:  Negative for abdominal pain and vomiting.  Genitourinary:  Negative for dysuria and hematuria.  Musculoskeletal:  Negative for arthralgias and back pain.  Skin:   Negative for color change and rash.  Neurological:  Negative for seizures and syncope.  All other systems reviewed and are negative.  Physical Exam Updated Vital Signs BP (!) 151/64   Pulse (!) 144   Temp 99.9 F (37.7 C) (Oral)   Resp (!) 26   SpO2 97%   Physical Exam Vitals and nursing note reviewed.  Constitutional:      General: He is not in acute distress.    Appearance: Normal appearance. He is well-developed.  HENT:     Head: Normocephalic and atraumatic.     Right Ear: External ear normal.     Left Ear: External ear normal.     Nose: Nose normal. No congestion.     Mouth/Throat:  Mouth: Mucous membranes are moist.     Pharynx: Oropharynx is clear. No posterior oropharyngeal erythema.  Eyes:     Extraocular Movements: Extraocular movements intact.     Conjunctiva/sclera: Conjunctivae normal.     Pupils: Pupils are equal, round, and reactive to light.  Cardiovascular:     Rate and Rhythm: Normal rate and regular rhythm.     Pulses: Normal pulses.     Heart sounds: No murmur heard. Pulmonary:     Effort: Tachypnea and respiratory distress (Moderate respiratory distress, tripod position, requiring nonrebreather) present.     Breath sounds: Wheezing (Faint expiratory) present. No rhonchi or rales.  Abdominal:     General: Abdomen is flat. Bowel sounds are normal.     Palpations: Abdomen is soft.     Tenderness: There is no abdominal tenderness. There is no guarding or rebound.  Musculoskeletal:        General: No swelling, tenderness or deformity. Normal range of motion.     Cervical back: Normal range of motion and neck supple. No rigidity.  Skin:    General: Skin is warm and dry.     Capillary Refill: Capillary refill takes less than 2 seconds.     Findings: No rash.  Neurological:     General: No focal deficit present.     Mental Status: He is alert and oriented to person, place, and time.  Psychiatric:        Mood and Affect: Mood normal.    ED Results  / Procedures / Treatments   Labs (all labs ordered are listed, but only abnormal results are displayed) Labs Reviewed  CBC WITH DIFFERENTIAL/PLATELET - Abnormal; Notable for the following components:      Result Value   WBC 2.7 (*)    Platelets 89 (*)    Neutro Abs 1.3 (*)    Lymphs Abs 0.5 (*)    All other components within normal limits  COMPREHENSIVE METABOLIC PANEL - Abnormal; Notable for the following components:   Glucose, Bld 190 (*)    Calcium 8.6 (*)    All other components within normal limits  BRAIN NATRIURETIC PEPTIDE - Abnormal; Notable for the following components:   B Natriuretic Peptide 138.0 (*)    All other components within normal limits  I-STAT CHEM 8, ED - Abnormal; Notable for the following components:   Glucose, Bld 186 (*)    Calcium, Ion 1.09 (*)    All other components within normal limits  I-STAT VENOUS BLOOD GAS, ED - Abnormal; Notable for the following components:   pO2, Ven 93.0 (*)    Calcium, Ion 1.11 (*)    All other components within normal limits  TROPONIN I (HIGH SENSITIVITY) - Abnormal; Notable for the following components:   Troponin I (High Sensitivity) 23 (*)    All other components within normal limits  RESP PANEL BY RT-PCR (FLU A&B, COVID) ARPGX2  CULTURE, BLOOD (ROUTINE X 2)  CULTURE, BLOOD (ROUTINE X 2)  RESPIRATORY PANEL BY PCR  BLOOD GAS, VENOUS  LACTIC ACID, PLASMA  LACTIC ACID, PLASMA  COMPREHENSIVE METABOLIC PANEL  CBC  PROCALCITONIN  PROCALCITONIN  TROPONIN I (HIGH SENSITIVITY)    EKG EKG Interpretation  Date/Time:  Sunday July 11 2021 18:03:24 EST Ventricular Rate:  165 PR Interval:  54 QRS Duration: 107 QT Interval:  300 QTC Calculation: 497 R Axis:   94 Text Interpretation: SVT vs afib Anterior infarct, old Confirmed by Wandra Arthurs [phone]) on 07/11/2021 6:15:11 PM  Radiology  CT Angio Chest PE W and/or Wo Contrast  Result Date: 07/11/2021 CLINICAL DATA:  Pt c/o increased SOB x 1 day. Pt had O2 sat in 60s  in triage. PE suspected, high prob EXAM: CT ANGIOGRAPHY CHEST WITH CONTRAST TECHNIQUE: Multidetector CT imaging of the chest was performed using the standard protocol during bolus administration of intravenous contrast. Multiplanar CT image reconstructions and MIPs were obtained to evaluate the vascular anatomy. CONTRAST:  64mL ISOVUE-370 IOPAMIDOL (ISOVUE-370) INJECTION 76% COMPARISON:  CT angio chest 02/11/2019 FINDINGS: Cardiovascular: Satisfactory opacification of the pulmonary arteries to the segmental level. Limited evaluation of the subsegmental level due to motion artifact. No evidence of pulmonary embolism. The main pulmonary artery is enlarged in caliber. Normal heart size. No significant pericardial effusion. The thoracic aorta is normal in caliber. Severe atherosclerotic plaque of the thoracic aorta. At least 3 vessel coronary artery calcifications. Aortic valve leaflet calcifications. Question aortic arch stent. Mediastinum/Nodes: Enlarged right hilar lymph node measuring up to 2.2 cm. No enlarged mediastinal, hilar, or axillary lymph nodes. Thyroid gland, trachea, and esophagus demonstrate no significant findings. Lungs/Pleura: Severe paraseptal and centrilobular emphysematous changes with a large right lower lobe basilar bulla measuring up to 13 cm. Bilateral lower lobe mild architectural distortion and traction bronchiectasis with superimposed bronchial wall thickening and mucous plugging. Upper Abdomen: Partially visualized fluid density lesion within the right kidney measuring up to at least 5.5 cm. No acute abnormality. Musculoskeletal: No chest wall abnormality. No suspicious lytic or blastic osseous lesions. No acute displaced fracture. Review of the MIP images confirms the above findings. IMPRESSION: 1. Bilateral lower lobe pulmonary scarring/bronchitic changes with superimposed developing infection/inflammation. 2. Emphysema (ICD10-J43.9) - severe with large bulla measuring up to 13 cm in the  right lower lobe. 3. Right hilar lymphadenopathy.  Attention on follow-up. 4. Enlarged main pulmonary artery suggestive of pulmonary hypertension. 5.  Aortic Atherosclerosis (ICD10-I70.0). Electronically Signed   By: Iven Finn M.D.   On: 07/11/2021 20:20   DG Chest Port 1 View  Result Date: 07/11/2021 CLINICAL DATA:  Shortness of breath, hypoxia history COPD. EXAM: PORTABLE CHEST 1 VIEW COMPARISON:  PA Lat 11/26/2020. FINDINGS: The cardiac size is normal. The aorta is tortuous and calcified but unchanged in configuration. Lungs are mildly emphysematous with chronic scar-like opacities in the lower lung fields, chronic slight blunting of the right lateral sulcus which could be a tiny chronic pleural effusion or pleurodiaphragmatic adhesion. The left sulci are sharp. The current study demonstrates increased streaky atelectasis or infiltrate in the left infrahilar/retrocardiac area with partial obscuration of the adjacent diaphragm. No other new lung opacity is seen. Osteopenia and thoracic spondylosis. IMPRESSION: COPD and scarring changes, with increased retrocardiac/infrahilar left lower lobe opacity concerning for pneumonia or aspiration versus atelectasis. Clinical correlation and PA Lat radiographic follow-up recommended. Electronically Signed   By: Telford Nab M.D.   On: 07/11/2021 20:13    Procedures Procedures   Medications Ordered in ED Medications  diltiazem (CARDIZEM) 125 mg in dextrose 5% 125 mL (1 mg/mL) infusion (15 mg/hr Intravenous Rate/Dose Change 07/11/21 2054)  azithromycin (ZITHROMAX) 500 mg in sodium chloride 0.9 % 250 mL IVPB (has no administration in time range)  atorvastatin (LIPITOR) tablet 40 mg (has no administration in time range)  losartan (COZAAR) tablet 75 mg (has no administration in time range)  cefTRIAXone (ROCEPHIN) 2 g in sodium chloride 0.9 % 100 mL IVPB (has no administration in time range)  azithromycin (ZITHROMAX) 500 mg in sodium chloride 0.9 % 250 mL  IVPB (  has no administration in time range)  ipratropium-albuterol (DUONEB) 0.5-2.5 (3) MG/3ML nebulizer solution 3 mL (has no administration in time range)  albuterol (PROVENTIL) (2.5 MG/3ML) 0.083% nebulizer solution 2.5 mg (has no administration in time range)  predniSONE (DELTASONE) tablet 40 mg (has no administration in time range)  enoxaparin (LOVENOX) injection 40 mg (has no administration in time range)  sodium chloride flush (NS) 0.9 % injection 3 mL (has no administration in time range)  acetaminophen (TYLENOL) tablet 650 mg (has no administration in time range)    Or  acetaminophen (TYLENOL) suppository 650 mg (has no administration in time range)  alum & mag hydroxide-simeth (MAALOX/MYLANTA) 200-200-20 MG/5ML suspension 30 mL (has no administration in time range)  menthol-cetylpyridinium (CEPACOL) lozenge 3 mg (has no administration in time range)  dexamethasone (DECADRON) injection 10 mg (10 mg Intravenous Given 07/11/21 1826)  magnesium sulfate IVPB 2 g 50 mL (0 g Intravenous Stopped 07/11/21 1938)  albuterol (PROVENTIL) (2.5 MG/3ML) 0.083% nebulizer solution 15 mg (15 mg Nebulization Given 07/11/21 1816)  ipratropium (ATROVENT) nebulizer solution 0.5 mg (0.5 mg Nebulization Given 07/11/21 1827)  diltiazem (CARDIZEM) injection 10 mg (10 mg Intravenous Given 07/11/21 1823)  ipratropium-albuterol (DUONEB) 0.5-2.5 (3) MG/3ML nebulizer solution (  Given 07/11/21 1817)  iopamidol (ISOVUE-370) 76 % injection 75 mL (75 mLs Intravenous Contrast Given 07/11/21 1916)  sodium chloride 0.9 % bolus 1,000 mL (1,000 mLs Intravenous New Bag/Given 07/11/21 2103)  cefTRIAXone (ROCEPHIN) 1 g in sodium chloride 0.9 % 100 mL IVPB (1 g Intravenous New Bag/Given 07/11/21 2102)    ED Course  I have reviewed the triage vital signs and the nursing notes.  Pertinent labs & imaging results that were available during my care of the patient were reviewed by me and considered in my medical decision making (see chart  for details).    MDM Rules/Calculators/A&P                          80 year old male presenting with respiratory distress.  Found to be hypoxic to 60s on room air.  Placed on nonrebreather.  Does have known COPD and has been having increased cough and dyspnea for several days.  He was given continuous albuterol, mag, steroids.  Was notably tachycardic to the 170s.  Hemodynamically stable.  EKG consistent with A. fib.  He was given a dose of Cardizem and started on cardizem gtt. initial Trope 23, which is mildly elevated but he does have elevated tropes at baseline.  BNP 138.  Viral testing pending.  CTA chest showed scarring of the bilateral lower lobes with concern for developing infection.  No obvious effusions or signs of fluid overload.  Low concern for CHF exacerbation or ACS.  Patient started on azithromycin and Rocephin for CAP coverage.  Given a bolus of IV fluids.  Heart rate improving.  Still requiring nonrebreather and dill gtt. medicine team contacted.  Will admit to stepdown for further evaluation management of acute hypoxic respiratory failure in the setting of COPD exacerbation.  Final Clinical Impression(s) / ED Diagnoses Final diagnoses:  Hypoxia  Dyspnea, unspecified type  COPD exacerbation [facility])    Rx / DC Orders ED Discharge Orders     None        Idamae Lusher, MD 07/11/21 2136    Drenda Freeze, MD 07/11/21 2224

## 2021-07-11 NOTE — ED Notes (Signed)
Spoke with MD per pt request to ask for meds for indigestion and sore throat

## 2021-07-11 NOTE — ED Notes (Signed)
Pt has significantly increased work of breathing and is sweaty and hot - discussed wsith Dr. Dolores Frame who agrees that it will be appropriate to place pt on BiPap. Called RT to request assistance in placing pt on BiPap.

## 2021-07-11 NOTE — ED Notes (Signed)
RT at bedside to place pt on BiPap °

## 2021-07-11 NOTE — ED Notes (Signed)
Phlebotomy at bedside.

## 2021-07-11 NOTE — Progress Notes (Signed)
RT placed patient on BIPAP. Patient is tolerating settings well at this time. RT will monitor as needed.

## 2021-07-11 NOTE — ED Notes (Signed)
X-ray at bedside

## 2021-07-11 NOTE — ED Notes (Signed)
Admitting provider at bedside.

## 2021-07-11 NOTE — H&P (Signed)
History and Physical   Cory Kemp XIP:[id number] DOB: [birth year] DOA: 07/11/2021  PCP: Jani Gravel, MD   Patient coming from: Home  Chief Complaint: SOB  HPI: Cory Kemp is a 80 y.o. male with medical history significant of COPD, diabetes, hypertension, anemia, OSA, sinus tachycardia, alcohol use, BPH, dementia, hyperlipidemia who presents ongoing shortness of breath.  Patient has had ongoing and progressive shortness of breath and cough for the past 3 days.  He tried his home inhalers without relief.  He is significantly hypoxic to the 60s on room air on arrival was placed on nonrebreather.  Reports heartburn and a sore throat as well.  He denies fevers, chills, chest pain, abdominal pain, constipation, diarrhea, nausea, vomiting.  Denies sick contacts.   ED Course: Vital signs in the ED significant for heart rate initially in the 170s which improved to the 130s with diltiazem, respiratory rate in the 20s, requiring 10 to 15 L to maintain oxygenation improved to 10 L at the time of admission, blood pressure in the 673A 193X systolic.  Lab work-up showed CMP with glucose 190, calcium 8.6, CBC with leukopenia to 2.7 and platelets of 89 which is stable from previous admission.  Troponin mildly elevated to 23 with repeat pending.  BNP mildly elevated at 138.  Lactic acid pending.  Respiratory panel for flu and COVID-negative.  Blood cultures pending.  Chest x-ray showed COPD with scarring and left lower lobe opacities possibly pneumonia versus aspiration versus atelectasis.  CT PE study showed emphysema, developing infection versus inflammation and right hilar lymphadenopathy with enlarged main pulmonary artery.  Patient received ceftriaxone, azithromycin, Decadron, magnesium, albuterol, Atrovent in the ED.  Was also placed on a dill drip with improvement in heart rate as above.  Review of Systems: As per HPI otherwise all other systems reviewed and are negative.  Past Medical History:   Diagnosis Date   Alcohol abuse    Anemia    Arthritis    Asthma    B12 deficiency 11/03/2013   BPH (benign prostatic hypertrophy)    COPD (chronic obstructive pulmonary disease) (HCC)    Dementia (HCC)    ?   Diabetes mellitus ([place]) 01/10/2012   Femoral hernia, right 03/27/2012   Glucose intolerance (impaired glucose tolerance)    Hyperlipidemia    Hypertension    Incisional hernias - swiss-cheese-type periumbilical 9/0/2409   Inguinal hernia - right 01/10/2012   Leukopenia 01/29/2013   Monocytosis 01/29/2013   Normochromic anemia 01/29/2013   Osteopenia     Past Surgical History:  Procedure Laterality Date   HEMORRHOID SURGERY     HERNIA REPAIR     INGUINAL HERNIA REPAIR  02/24/2012   Procedure: LAPAROSCOPIC INGUINAL HERNIA;  Surgeon: Adin Hector, MD;  Location: [facility];  Service: General;  Laterality: N/A;   TUMOR EXCISION     intestinal after SBO ( Benign), appendectomy   VENTRAL HERNIA REPAIR  02/24/2012   Procedure: LAPAROSCOPIC VENTRAL HERNIA;  Surgeon: Adin Hector, MD;  Location: [facility];  Service: General;  Laterality: N/A;  lysis of adhesions, excision of abdominal wall lipomae    Social History  reports that he quit smoking about 2 years ago. His smoking use included cigarettes. He started smoking about 65 years ago. He has a 63.00 pack-year smoking history. He has never used smokeless tobacco. He reports that he does not drink alcohol and does not use drugs.  Allergies  Allergen Reactions   Adhesive [Tape] Rash    No "  PLASTIC" tape!!! Patient broke out in blisters!!    Family History  Problem Relation Age of Onset   Lung cancer Father    Lung cancer Mother    Diabetes Brother   Reviewed on admission  Prior to Admission medications   Medication Sig Start Date End Date Taking? Authorizing Provider  albuterol (PROVENTIL) (2.5 MG/3ML) 0.083% nebulizer solution Take 3 mLs (2.5 mg total) by nebulization every 6 (six) hours as needed for wheezing or shortness of  breath. 01/03/18   Lauraine Rinne, NP  albuterol (VENTOLIN HFA) 108 (90 Base) MCG/ACT inhaler INHALE 2 PUFFS BY MOUTH EVERY 6 HOURS AS NEEDED FOR WHEEZING OR SHORTNESS OF BREATH 06/29/21   Mannam, Praveen, MD  ascorbic acid (VITAMIN C) 500 MG tablet Take 1 tablet (500 mg total) by mouth daily. 04/25/20   Allie Bossier, MD  atorvastatin (LIPITOR) 40 MG tablet Take 1 tablet (40 mg total) by mouth daily. 04/25/20   Allie Bossier, MD  DALIRESP 250 MCG TABS TAKE 1 TABLET BY MOUTH EVERY MORNING 06/07/21   Mannam, Hart Robinsons, MD  escitalopram (LEXAPRO) 10 MG tablet Take 10 mg by mouth at bedtime.  08/23/19   [provider]  fluticasone (FLONASE) 50 MCG/ACT nasal spray Place 2 sprays into both nostrils daily. Reduce to 1 spray each nostril daily after stopping Afrin Patient not taking: No sig reported 03/20/19   Mannam, Praveen, MD  guaiFENesin-dextromethorphan (ROBITUSSIN DM) 100-10 MG/5ML syrup Take 10 mLs by mouth every 4 (four) hours as needed for cough. Patient not taking: No sig reported 04/24/20   Allie Bossier, MD  ibuprofen (ADVIL) 200 MG tablet Take 400 mg by mouth at bedtime.    [provider]  losartan (COZAAR) 25 MG tablet Take 3 tablets (75 mg total) by mouth at bedtime. 04/24/20   Allie Bossier, MD  OXYGEN Inhale 4 L into the lungs continuous.    [provider]  Donnal Debar 100-62.5-25 MCG/INH AEPB INHALE 1 PUFF BY MOUTH EVERY DAY 02/01/21   Martyn Ehrich, NP  zinc sulfate 220 (50 Zn) MG capsule Take 1 capsule (220 mg total) by mouth daily. Patient not taking: Reported on 05/31/2021 04/25/20   Allie Bossier, MD    Physical Exam: Vitals:   07/11/21 2015 07/11/21 2030 07/11/21 2045 07/11/21 2100  BP: 126/62 137/76 (!) 150/77 (!) 151/64  Pulse: (!) 150 (!) 145 (!) 138 (!) 144  Resp: 14 (!) 21 (!) 26 (!) 26  Temp:      TempSrc:      SpO2: 96% 97% 97% 97%   Physical Exam Constitutional:      General: He is not in acute distress.    Appearance:  Normal appearance.  HENT:     Head: Normocephalic and atraumatic.     Mouth/Throat:     Mouth: Mucous membranes are moist.     Pharynx: Oropharynx is clear.  Eyes:     Extraocular Movements: Extraocular movements intact.     Pupils: Pupils are equal, round, and reactive to light.  Cardiovascular:     Rate and Rhythm: Regular rhythm. Tachycardia present.     Pulses: Normal pulses.     Heart sounds: Normal heart sounds.  Pulmonary:     Effort: Pulmonary effort is normal. Tachypnea present. No respiratory distress.     Breath sounds: Decreased breath sounds, wheezing and rhonchi present.  Abdominal:     General: Bowel sounds are normal. There is no distension.  Palpations: Abdomen is soft.     Tenderness: There is no abdominal tenderness.  Musculoskeletal:        General: No swelling or deformity.  Skin:    General: Skin is warm and dry.  Neurological:     General: No focal deficit present.     Mental Status: Mental status is at baseline.   Labs on Admission: I have personally reviewed following labs and imaging studies  CBC: Recent Labs  Lab 07/11/21 1816 07/11/21 1818 07/11/21 1819  WBC 2.7*  --   --   NEUTROABS 1.3*  --   --   HGB 13.7 14.6 14.3  HCT 43.4 43.0 42.0  MCV 91.0  --   --   PLT 89*  --   --     Basic Metabolic Panel: Recent Labs  Lab 07/11/21 1816 07/11/21 1818 07/11/21 1819  NA 138 139 139  K 4.5 4.2 4.2  CL 102 103  --   CO2 25  --   --   GLUCOSE 190* 186*  --   BUN 15 18  --   CREATININE 0.98 0.90  --   CALCIUM 8.6*  --   --     GFR: CrCl cannot be calculated (Unknown ideal weight.).  Liver Function Tests: Recent Labs  Lab 07/11/21 1816  AST 29  ALT 13  ALKPHOS 78  BILITOT 0.9  PROT 7.0  ALBUMIN 3.9    Urine analysis:    Component Value Date/Time   COLORURINE YELLOW 04/19/2020 1926   APPEARANCEUR CLEAR 04/19/2020 1926   LABSPEC 1.016 04/19/2020 1926   PHURINE 5.0 04/19/2020 1926   GLUCOSEU NEGATIVE 04/19/2020 1926    HGBUR LARGE (A) 04/19/2020 1926   BILIRUBINUR NEGATIVE 04/19/2020 1926   KETONESUR 5 (A) 04/19/2020 1926   PROTEINUR 100 (A) 04/19/2020 1926   UROBILINOGEN 0.2 07/22/2013 0725   NITRITE NEGATIVE 04/19/2020 1926   LEUKOCYTESUR NEGATIVE 04/19/2020 1926    Radiological Exams on Admission: CT Angio Chest PE W and/or Wo Contrast  Result Date: 07/11/2021 CLINICAL DATA:  Pt c/o increased SOB x 1 day. Pt had O2 sat in 60s in triage. PE suspected, high prob EXAM: CT ANGIOGRAPHY CHEST WITH CONTRAST TECHNIQUE: Multidetector CT imaging of the chest was performed using the standard protocol during bolus administration of intravenous contrast. Multiplanar CT image reconstructions and MIPs were obtained to evaluate the vascular anatomy. CONTRAST:  59mL ISOVUE-370 IOPAMIDOL (ISOVUE-370) INJECTION 76% COMPARISON:  CT angio chest 02/11/2019 FINDINGS: Cardiovascular: Satisfactory opacification of the pulmonary arteries to the segmental level. Limited evaluation of the subsegmental level due to motion artifact. No evidence of pulmonary embolism. The main pulmonary artery is enlarged in caliber. Normal heart size. No significant pericardial effusion. The thoracic aorta is normal in caliber. Severe atherosclerotic plaque of the thoracic aorta. At least 3 vessel coronary artery calcifications. Aortic valve leaflet calcifications. Question aortic arch stent. Mediastinum/Nodes: Enlarged right hilar lymph node measuring up to 2.2 cm. No enlarged mediastinal, hilar, or axillary lymph nodes. Thyroid gland, trachea, and esophagus demonstrate no significant findings. Lungs/Pleura: Severe paraseptal and centrilobular emphysematous changes with a large right lower lobe basilar bulla measuring up to 13 cm. Bilateral lower lobe mild architectural distortion and traction bronchiectasis with superimposed bronchial wall thickening and mucous plugging. Upper Abdomen: Partially visualized fluid density lesion within the right kidney measuring  up to at least 5.5 cm. No acute abnormality. Musculoskeletal: No chest wall abnormality. No suspicious lytic or blastic osseous lesions. No acute displaced fracture. Review of  the MIP images confirms the above findings. IMPRESSION: 1. Bilateral lower lobe pulmonary scarring/bronchitic changes with superimposed developing infection/inflammation. 2. Emphysema (ICD10-J43.9) - severe with large bulla measuring up to 13 cm in the right lower lobe. 3. Right hilar lymphadenopathy.  Attention on follow-up. 4. Enlarged main pulmonary artery suggestive of pulmonary hypertension. 5.  Aortic Atherosclerosis (ICD10-I70.0). Electronically Signed   By: Iven Finn M.D.   On: 07/11/2021 20:20   DG Chest Port 1 View  Result Date: 07/11/2021 CLINICAL DATA:  Shortness of breath, hypoxia history COPD. EXAM: PORTABLE CHEST 1 VIEW COMPARISON:  PA Lat 11/26/2020. FINDINGS: The cardiac size is normal. The aorta is tortuous and calcified but unchanged in configuration. Lungs are mildly emphysematous with chronic scar-like opacities in the lower lung fields, chronic slight blunting of the right lateral sulcus which could be a tiny chronic pleural effusion or pleurodiaphragmatic adhesion. The left sulci are sharp. The current study demonstrates increased streaky atelectasis or infiltrate in the left infrahilar/retrocardiac area with partial obscuration of the adjacent diaphragm. No other new lung opacity is seen. Osteopenia and thoracic spondylosis. IMPRESSION: COPD and scarring changes, with increased retrocardiac/infrahilar left lower lobe opacity concerning for pneumonia or aspiration versus atelectasis. Clinical correlation and PA Lat radiographic follow-up recommended. Electronically Signed   By: Telford Nab M.D.   On: 07/11/2021 20:13    EKG: Independently reviewed.  Tachycardia at 165 bpm.  Appearance of possible SVT versus atrial fibrillation.  Assessment/Plan Principal Problem:   Acute on chronic respiratory failure  with hypoxia (HCC) Active Problems:   Tachycardia   Essential hypertension   COPD with acute exacerbation (HCC)   Acute on chronic respiratory failure with hypoxia COPD exacerbation ?Pneumonia > Patient presenting with several days of cough and shortness of breath found to be hypoxic to the 60s on room air on arrival.  Initially tachycardic to the 170s requiring 15 L nonrebreather to maintain saturations.  Heart rate has improved with intervention as below and is now down to 10 L nonrebreather. > Leukopenia in the ED so lower suspicion for bacterial infection however patient has been started on ceftriaxone azithromycin and will continue this for now while we check procalcitonin and respiratory viral panel. > We will continue to treat for COPD exacerbation.  Does have wheezing on exam but breath sounds are distant. > 6 chest x-ray and CT both show possible developing pneumonia.  CT negative for PE. - Monitor on progressive unit - Continue with supplemental oxygen, as needed BiPAP - Procalcitonin - Respiratory viral panel - Trend fever curve and white count - Continue ceftriaxone - Continue home Trelegy - Scheduled duo nebs, as needed albuterol nebs - Daily prednisone  Tachycardia > Noted to be tachycardic to the 170s on arrival.  Due to tachycardia difficult to determine rhythm SVT versus A. fib with RVR.  Decision was made to treat for A. fib with RVR in the ED with diltiazem load and drip and heart rate has responded now in the 120s to 130s. > Chart review shows does have history of reported A. fib with RVR during other admissions likely related to other flareups. - We will continue with diltiazem drip for now while he is tolerating it from a blood pressure standpoint - Wean drip as able  Hypertension - Continue home losartan  Hyperlipidemia - Continue home atorvastatin  DVT prophylaxis: Lovenox  Code Status:   Full Family Communication:  To contact son, Cory Kemp, but there was no  answer. Able to reach daughter who was  updated by phone. Disposition Plan:   Patient is from:  Home  Anticipated DC to:  Home  Anticipated DC date:  1 to 4 days  Anticipated DC barriers: None  Consults called:  None  Admission status:  Observation, progressive  Severity of Illness: The appropriate patient status for this patient is OBSERVATION. Observation status is judged to be reasonable and necessary in order to provide the required intensity of service to ensure the patient's safety. The patient's presenting symptoms, physical exam findings, and initial radiographic and laboratory data in the context of their medical condition is felt to place them at decreased risk for further clinical deterioration. Furthermore, it is anticipated that the patient will be medically stable for discharge from the hospital within 2 midnights of admission.    Marcelyn Bruins MD [facility]  How to contact the [facility] Attending or Consulting provider [place] or covering provider during after hours [place], for this patient?   Check the care team in [facility] and look for a) attending/consulting [facility] provider listed and b) the [facility] team listed Log into [email or web address] and use [place]'s universal password to access. If you do not have the password, please contact the hospital operator. Locate the [facility] provider you are looking for under [facility] and page to a number that you can be directly reached. If you still have difficulty reaching the provider, please page the [facility] (Director on Call) for the Hospitalists listed on amion for assistance.  07/11/2021, 9:55 PM

## 2021-07-11 NOTE — Progress Notes (Signed)
Patient placed on BiPAP per order.

## 2021-07-12 ENCOUNTER — Observation Stay (HOSPITAL_COMMUNITY): Payer: Medicare HMO

## 2021-07-12 ENCOUNTER — Other Ambulatory Visit: Payer: Self-pay

## 2021-07-12 ENCOUNTER — Encounter (HOSPITAL_COMMUNITY): Payer: Self-pay | Admitting: Internal Medicine

## 2021-07-12 ENCOUNTER — Inpatient Hospital Stay (HOSPITAL_COMMUNITY): Payer: Medicare HMO

## 2021-07-12 DIAGNOSIS — Z8546 Personal history of malignant neoplasm of prostate: Secondary | ICD-10-CM | POA: Diagnosis not present

## 2021-07-12 DIAGNOSIS — R059 Cough, unspecified: Secondary | ICD-10-CM | POA: Diagnosis not present

## 2021-07-12 DIAGNOSIS — Z87891 Personal history of nicotine dependence: Secondary | ICD-10-CM | POA: Diagnosis not present

## 2021-07-12 DIAGNOSIS — E7800 Pure hypercholesterolemia, unspecified: Secondary | ICD-10-CM | POA: Diagnosis present

## 2021-07-12 DIAGNOSIS — Z9981 Dependence on supplemental oxygen: Secondary | ICD-10-CM | POA: Diagnosis not present

## 2021-07-12 DIAGNOSIS — E119 Type 2 diabetes mellitus without complications: Secondary | ICD-10-CM | POA: Diagnosis not present

## 2021-07-12 DIAGNOSIS — J449 Chronic obstructive pulmonary disease, unspecified: Secondary | ICD-10-CM | POA: Diagnosis not present

## 2021-07-12 DIAGNOSIS — Z8616 Personal history of COVID-19: Secondary | ICD-10-CM | POA: Diagnosis not present

## 2021-07-12 DIAGNOSIS — I428 Other cardiomyopathies: Secondary | ICD-10-CM | POA: Diagnosis not present

## 2021-07-12 DIAGNOSIS — R69 Illness, unspecified: Secondary | ICD-10-CM | POA: Diagnosis not present

## 2021-07-12 DIAGNOSIS — J9811 Atelectasis: Secondary | ICD-10-CM | POA: Diagnosis not present

## 2021-07-12 DIAGNOSIS — I471 Supraventricular tachycardia: Secondary | ICD-10-CM | POA: Diagnosis present

## 2021-07-12 DIAGNOSIS — E039 Hypothyroidism, unspecified: Secondary | ICD-10-CM | POA: Diagnosis not present

## 2021-07-12 DIAGNOSIS — F0390 Unspecified dementia without behavioral disturbance: Secondary | ICD-10-CM | POA: Diagnosis present

## 2021-07-12 DIAGNOSIS — D72819 Decreased white blood cell count, unspecified: Secondary | ICD-10-CM | POA: Diagnosis not present

## 2021-07-12 DIAGNOSIS — J9621 Acute and chronic respiratory failure with hypoxia: Secondary | ICD-10-CM | POA: Diagnosis not present

## 2021-07-12 DIAGNOSIS — Z7951 Long term (current) use of inhaled steroids: Secondary | ICD-10-CM | POA: Diagnosis not present

## 2021-07-12 DIAGNOSIS — Z833 Family history of diabetes mellitus: Secondary | ICD-10-CM | POA: Diagnosis not present

## 2021-07-12 DIAGNOSIS — E538 Deficiency of other specified B group vitamins: Secondary | ICD-10-CM | POA: Diagnosis present

## 2021-07-12 DIAGNOSIS — R0602 Shortness of breath: Secondary | ICD-10-CM | POA: Diagnosis not present

## 2021-07-12 DIAGNOSIS — R079 Chest pain, unspecified: Secondary | ICD-10-CM | POA: Diagnosis not present

## 2021-07-12 DIAGNOSIS — Z801 Family history of malignant neoplasm of trachea, bronchus and lung: Secondary | ICD-10-CM | POA: Diagnosis not present

## 2021-07-12 DIAGNOSIS — J439 Emphysema, unspecified: Secondary | ICD-10-CM | POA: Diagnosis not present

## 2021-07-12 DIAGNOSIS — Z79899 Other long term (current) drug therapy: Secondary | ICD-10-CM | POA: Diagnosis not present

## 2021-07-12 DIAGNOSIS — I10 Essential (primary) hypertension: Secondary | ICD-10-CM | POA: Diagnosis not present

## 2021-07-12 DIAGNOSIS — N400 Enlarged prostate without lower urinary tract symptoms: Secondary | ICD-10-CM | POA: Diagnosis present

## 2021-07-12 LAB — RESPIRATORY PANEL BY PCR

## 2021-07-12 LAB — CBC
HCT: 38.5 % — ABNORMAL LOW (ref 39.0–52.0)
Hemoglobin: 12.3 g/dL — ABNORMAL LOW (ref 13.0–17.0)
MCH: 28.7 pg (ref 26.0–34.0)
MCHC: 31.9 g/dL (ref 30.0–36.0)
MCV: 90 fL (ref 80.0–100.0)
Platelets: 83 10*3/uL — ABNORMAL LOW (ref 150–400)
RBC: 4.28 MIL/uL (ref 4.22–5.81)
RDW: 15.1 % (ref 11.5–15.5)
WBC: 6.4 10*3/uL (ref 4.0–10.5)
nRBC: 0 % (ref 0.0–0.2)

## 2021-07-12 LAB — COMPREHENSIVE METABOLIC PANEL
ALT: 14 U/L (ref 0–44)
AST: 23 U/L (ref 15–41)
Albumin: 3.3 g/dL — ABNORMAL LOW (ref 3.5–5.0)
Alkaline Phosphatase: 63 U/L (ref 38–126)
Anion gap: 6 (ref 5–15)
BUN: 17 mg/dL (ref 8–23)
CO2: 24 mmol/L (ref 22–32)
Calcium: 8.1 mg/dL — ABNORMAL LOW (ref 8.9–10.3)
Chloride: 105 mmol/L (ref 98–111)
Creatinine, Ser: 0.94 mg/dL (ref 0.61–1.24)
GFR, Estimated: >60 mL/min (ref >60)
Glucose, Bld: 176 mg/dL — ABNORMAL HIGH (ref 70–99)
Potassium: 4.2 mmol/L (ref 3.5–5.1)
Sodium: 135 mmol/L (ref 135–145)
Total Bilirubin: 0.3 mg/dL (ref 0.3–1.2)
Total Protein: 6.3 g/dL — ABNORMAL LOW (ref 6.5–8.1)

## 2021-07-12 LAB — C-REACTIVE PROTEIN: CRP: 11.4 mg/dL — ABNORMAL HIGH (ref <1.0)

## 2021-07-12 LAB — MAGNESIUM: Magnesium: 2.2 mg/dL (ref 1.7–2.4)

## 2021-07-12 LAB — PROCALCITONIN: Procalcitonin: 0.67 ng/mL

## 2021-07-12 LAB — BRAIN NATRIURETIC PEPTIDE: B Natriuretic Peptide: 196.3 pg/mL — ABNORMAL HIGH (ref 0.0–100.0)

## 2021-07-12 MED ORDER — ORAL CARE MOUTH RINSE
15.0000 mL | Freq: Two times a day (BID) | OROMUCOSAL | Status: DC
  Administered 2021-07-13 – 2021-07-16 (×5): 15 mL via OROMUCOSAL

## 2021-07-12 MED ORDER — DILTIAZEM HCL 90 MG PO TABS
90.0000 mg | ORAL_TABLET | Freq: Two times a day (BID) | ORAL | Status: DC
  Administered 2021-07-12: 90 mg via ORAL
  Filled 2021-07-12 (×2): qty 1

## 2021-07-12 MED ORDER — DILTIAZEM HCL 25 MG/5ML IV SOLN
10.0000 mg | Freq: Four times a day (QID) | INTRAVENOUS | Status: DC | PRN
  Filled 2021-07-12: qty 5

## 2021-07-12 MED ORDER — DILTIAZEM HCL 30 MG PO TABS
30.0000 mg | ORAL_TABLET | Freq: Two times a day (BID) | ORAL | Status: DC
  Administered 2021-07-12: 30 mg via ORAL
  Filled 2021-07-12 (×2): qty 1

## 2021-07-12 MED ORDER — ALUM & MAG HYDROXIDE-SIMETH 200-200-20 MG/5ML PO SUSP
15.0000 mL | Freq: Four times a day (QID) | ORAL | Status: DC | PRN
  Administered 2021-07-12: 15 mL via ORAL
  Filled 2021-07-12: qty 30

## 2021-07-12 MED ORDER — METHYLPREDNISOLONE SODIUM SUCC 125 MG IJ SOLR
60.0000 mg | Freq: Two times a day (BID) | INTRAMUSCULAR | Status: DC
  Administered 2021-07-12 – 2021-07-15 (×7): 60 mg via INTRAVENOUS
  Filled 2021-07-12 (×7): qty 2

## 2021-07-12 MED ORDER — FUROSEMIDE 10 MG/ML IJ SOLN
20.0000 mg | Freq: Once | INTRAMUSCULAR | Status: AC
  Administered 2021-07-12: 20 mg via INTRAVENOUS
  Filled 2021-07-12: qty 2

## 2021-07-12 MED ORDER — PANTOPRAZOLE SODIUM 40 MG PO TBEC
40.0000 mg | DELAYED_RELEASE_TABLET | Freq: Every day | ORAL | Status: DC
  Administered 2021-07-12 – 2021-07-16 (×5): 40 mg via ORAL
  Filled 2021-07-12 (×5): qty 1

## 2021-07-12 MED ORDER — TRAZODONE HCL 50 MG PO TABS
25.0000 mg | ORAL_TABLET | Freq: Every evening | ORAL | Status: DC | PRN
  Administered 2021-07-12 – 2021-07-15 (×4): 25 mg via ORAL
  Filled 2021-07-12 (×4): qty 1

## 2021-07-12 NOTE — Evaluation (Signed)
Occupational Therapy Evaluation Patient Details Name: Cory Kemp MRN: [id number] DOB: 16-Oct-1940 Today's Date: 07/12/2021   History of Present Illness Pt is an 80 year old man admitted on 07/11/21 with SOB and hypoxia with Sp02 in the 60s. PMH: COPD, DM, HTN, anemia, OSA, sinus tachycardia, HLD, alcohol abuse, BPH.   Clinical Impression   Pt was functioning independently prior to admission, reports he was limited with some of his hobbies (woodworking) due to his breathing. Pt presents with decreased activity tolerance with Sp02 dropping to 88% on 6L with just walking to the door and back to his bed. He demonstrates some impulsivity and decreased safety awareness, but overall functioning at a set up to min guard assist level. Will follow acutely, recommending HHOT to address IADL and safety in the home.      Recommendations for follow up therapy are one component of a multi-disciplinary discharge planning process, led by the attending physician.  Recommendations may be updated based on patient status, additional functional criteria and insurance authorization.   Follow Up Recommendations  Home health OT    Assistance Recommended at Discharge Intermittent Supervision/Assistance  Functional Status Assessment  Patient has had a recent decline in their functional status and demonstrates the ability to make significant improvements in function in a reasonable and predictable amount of time.  Equipment Recommendations  None recommended by OT    Recommendations for Other Services       Precautions / Restrictions Precautions Precautions: Fall Precaution Comments: watch 02      Mobility Bed Mobility               General bed mobility comments: pt seated EOB at beginning and end of session    Transfers Overall transfer level: Needs assistance Equipment used: None Transfers: Sit to/from Stand Sit to Stand: Min guard           General transfer comment: min guard assist for  safety      Balance Overall balance assessment: Needs assistance   Sitting balance-Leahy Scale: Good       Standing balance-Leahy Scale: Fair                             ADL either performed or assessed with clinical judgement   ADL Overall ADL's : Needs assistance/impaired Eating/Feeding: Independent;Sitting   Grooming: Set up;Sitting   Upper Body Bathing: Set up;Sitting   Lower Body Bathing: Min guard;Sit to/from stand   Upper Body Dressing : Set up;Sitting   Lower Body Dressing: Min guard;Sit to/from stand   Toilet Transfer: Min guard;Ambulation   Toileting- Clothing Manipulation and Hygiene: Min guard;Sit to/from stand       Functional mobility during ADLs: Min guard General ADL Comments: pt with decreased activity tolerance, poor awareness of lines with mobility     Vision Baseline Vision/History: 1 Wears glasses Ability to See in Adequate Light: 0 Adequate Patient Visual Report: No change from baseline       Perception     Praxis      Pertinent Vitals/Pain Pain Assessment: Faces Faces Pain Scale: Hurts little more Pain Location: throat, neck Pain Descriptors / Indicators: Sore Pain Intervention(s): Monitored during session     Hand Dominance Left   Extremity/Trunk Assessment Upper Extremity Assessment Upper Extremity Assessment: Overall WFL for tasks assessed   Lower Extremity Assessment Lower Extremity Assessment: Defer to PT evaluation       Communication Communication Communication: [facility]  Cognition Arousal/Alertness: Awake/alert Behavior During Therapy: Impulsive Overall Cognitive Status: Impaired/Different from baseline Area of Impairment: Safety/judgement                         Safety/Judgement: Decreased awareness of safety           General Comments       Exercises     Shoulder Instructions      Home Living Family/patient expects to be discharged to:: Private residence Living Arrangements:  Non-relatives/Friends Available Help at Discharge: Friend(s);Available PRN/intermittently Type of Home: Mobile home Home Access: Stairs to enter Entrance Stairs-Number of Steps: 5 Entrance Stairs-Rails: Right;Left Home Layout: One level     Bathroom Shower/Tub: Teacher, early years/pre: Standard     Home Equipment: Air cabin crew (4 wheels);Other (comment) (02)   Additional Comments: uses 02 at night      Prior Functioning/Environment Prior Level of Function : Independent/Modified Independent             Mobility Comments: walks without a device ADLs Comments: independent in ADL, IADL, drives, 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        OT Problem List: Decreased activity tolerance;Impaired balance (sitting and/or standing);Pain      OT Treatment/Interventions: Self-care/ADL training;Energy conservation;Therapeutic activities;Patient/family education;Balance training    OT Goals(Current goals can be found in the care plan section) Acute Rehab OT Goals OT Goal Formulation: With patient Time For Goal Achievement: 07/26/21 Potential to Achieve Goals: Good ADL Goals Pt Will Perform Grooming: with modified independence;standing Pt Will Perform Lower Body Bathing: with modified independence;sit to/from stand Pt Will Perform Lower Body Dressing: with modified independence;sit to/from stand Pt Will Transfer to Toilet: with modified independence;regular height toilet Pt Will Perform Toileting - Clothing Manipulation and hygiene: with modified independence;sit to/from stand Additional ADL Goal #1: Pt will state at least 3 energy conservation strategies.  OT Frequency: Min 2X/week   Barriers to D/C:            Co-evaluation PT/OT/SLP Co-Evaluation/Treatment: Yes Reason for Co-Treatment: For patient/therapist safety   OT goals addressed during session: ADL's and self-care      AM-PAC OT "6 Clicks" Daily Activity     Outcome Measure Help  from another person eating meals?: None Help from another person taking care of personal grooming?: A Little Help from another person toileting, which includes using toliet, bedpan, or urinal?: A Little Help from another person bathing (including washing, rinsing, drying)?: None Help from another person to put on and taking off regular upper body clothing?: A Little   6 Click Score: 17   End of Session Equipment Utilized During Treatment: Oxygen (6L)  Activity Tolerance: Patient limited by fatigue Patient left: in bed;with call bell/phone within reach  OT Visit Diagnosis: Unsteadiness on feet (R26.81);Pain;Other (comment) (decreased activity tolerance)                Time: 7619-5093 OT Time Calculation (min): 16 min Charges:  OT General Charges $OT Visit: 1 Visit OT Evaluation $OT Eval Low Complexity: 1 Low Nestor Lewandowsky, OTR/L Acute Rehabilitation Services Pager: [phone] Office: [phone]  Malka So 07/12/2021, 11:55 AM

## 2021-07-12 NOTE — ED Notes (Signed)
Pt is adamant that he cannot tolerate the BiPap anymore. RN explained that his vitals have improved since he started BiPap therapy but he insists on returning to NRB. Placed pt on NRB at 10L and will continue to monitor

## 2021-07-12 NOTE — Progress Notes (Signed)
Patient currently on 5L [place]. No distress noted at this time. BIPAP not needed.

## 2021-07-12 NOTE — Evaluation (Signed)
Physical Therapy Evaluation Patient Details Name: Cory Kemp MRN: [id number] DOB: Apr 02, 1941 Today's Date: 07/12/2021  History of Present Illness  Pt is an 80 year old man admitted on 07/11/21 with SOB and hypoxia with Sp02 in the 60s. PMH: COPD, DM, HTN, anemia, OSA, sinus tachycardia, HLD, alcohol abuse, BPH.  Clinical Impression  Pt admitted secondary to problem above with deficits below. Pt requiring min guard A for mobility tasks in the room. Pt fatiguing easily and with DOE at 3/4. Oxygen sats decreased to 88% on 6L, but quickly returned to >90% on 6L with seated rest. Anticipate pt will progress well once symptoms improve. Educated about using Rollator for energy conservation at home. Will continue to follow acutely.        Recommendations for follow up therapy are one component of a multi-disciplinary discharge planning process, led by the attending physician.  Recommendations may be updated based on patient status, additional functional criteria and insurance authorization.  Follow Up Recommendations Home health PT    Assistance Recommended at Discharge Intermittent Supervision/Assistance  Functional Status Assessment Patient has had a recent decline in their functional status and demonstrates the ability to make significant improvements in function in a reasonable and predictable amount of time.  Equipment Recommendations  None recommended by PT    Recommendations for Other Services       Precautions / Restrictions Precautions Precautions: Fall Precaution Comments: watch 02 Restrictions Weight Bearing Restrictions: No      Mobility  Bed Mobility               General bed mobility comments: pt seated EOB at beginning and end of session    Transfers Overall transfer level: Needs assistance Equipment used: None Transfers: Sit to/from Stand Sit to Stand: Min guard           General transfer comment: min guard assist for safety     Ambulation/Gait Ambulation/Gait assistance: Min guard Gait Distance (Feet): 15 Feet Assistive device: None Gait Pattern/deviations: Step-through pattern;Decreased stride length Gait velocity: Decreased     General Gait Details: Ambulated short distance in room. Min guard for safety and pt with increased SOB. Oxygen sats at 88% on 6L, but quickly returned to 92-93% on 6L with seated rest  Stairs            Wheelchair Mobility    Modified Rankin (Stroke Patients Only)       Balance Overall balance assessment: Needs assistance Sitting-balance support: No upper extremity supported Sitting balance-Leahy Scale: Good     Standing balance support: No upper extremity supported Standing balance-Leahy Scale: Fair                               Pertinent Vitals/Pain Pain Assessment: Faces Faces Pain Scale: Hurts little more Pain Location: throat, neck Pain Descriptors / Indicators: Sore Pain Intervention(s): Limited activity within patient's tolerance;Monitored during session;Repositioned    Home Living Family/patient expects to be discharged to:: Private residence Living Arrangements: Non-relatives/Friends Available Help at Discharge: Friend(s);Available PRN/intermittently Type of Home: Mobile home Home Access: Stairs to enter Entrance Stairs-Rails: Right;Left Entrance Stairs-Number of Steps: 5   Home Layout: One level Home Equipment: Air cabin crew (4 wheels);Other (comment) (02) Additional Comments: uses 02 at night    Prior Function Prior Level of Function : Independent/Modified Independent             Mobility Comments: walks without a device ADLs Comments: independent in  ADL, IADL, drives, 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     [place]: Left    Extremity/Trunk Assessment   Upper Extremity Assessment Upper Extremity Assessment: Defer to OT evaluation    Lower Extremity Assessment Lower  Extremity Assessment: Generalized weakness    Cervical / Trunk Assessment Cervical / Trunk Assessment: Kyphotic  Communication   Communication: HOH  Cognition Arousal/Alertness: Awake/alert Behavior During Therapy: Impulsive Overall Cognitive Status: Impaired/Different from baseline Area of Impairment: Safety/judgement                         Safety/Judgement: Decreased awareness of safety              General Comments      Exercises     Assessment/Plan    PT Assessment Patient needs continued PT services  PT Problem List Decreased strength;Decreased activity tolerance;Decreased balance;Decreased mobility;Decreased knowledge of use of DME;Decreased cognition;Decreased safety awareness;Decreased knowledge of precautions       PT Treatment Interventions DME instruction;Gait training;Functional mobility training;Stair training;Therapeutic activities;Therapeutic exercise;Balance training;Patient/family education    PT Goals (Current goals can be found in the Care Plan section)  Acute Rehab PT Goals Patient Stated Goal: to feel better and go home PT Goal Formulation: With patient Time For Goal Achievement: 07/26/21 Potential to Achieve Goals: Good    Frequency Min 3X/week   Barriers to discharge        Co-evaluation PT/OT/SLP Co-Evaluation/Treatment: Yes Reason for Co-Treatment: For patient/therapist safety PT goals addressed during session: Mobility/safety with mobility;Balance         AM-PAC PT "6 Clicks" Mobility  Outcome Measure Help needed turning from your back to your side while in a flat bed without using bedrails?: None Help needed moving from lying on your back to sitting on the side of a flat bed without using bedrails?: A Little Help needed moving to and from a bed to a chair (including a wheelchair)?: A Little Help needed standing up from a chair using your arms (e.g., wheelchair or bedside chair)?: A Little Help needed to walk in  hospital room?: A Little Help needed climbing 3-5 steps with a railing? : A Lot 6 Click Score: 18    End of Session Equipment Utilized During Treatment: Oxygen Activity Tolerance: Treatment limited secondary to medical complications (Comment);Patient limited by fatigue (SOB) Patient left: in bed;with call bell/phone within reach (sitting edge of stretcher on ED) Nurse Communication: Mobility status PT Visit Diagnosis: Unsteadiness on feet (R26.81);Muscle weakness (generalized) (M62.81)    Time: 5038-8828 PT Time Calculation (min) (ACUTE ONLY): 17 min   Charges:   PT Evaluation $PT Eval Moderate Complexity: 1 Mod          Cory Kemp, PT, DPT  Acute Rehabilitation Services  Pager: [phone] Office: [phone]   Cory Kemp 07/12/2021, 1:19 PM

## 2021-07-12 NOTE — Plan of Care (Signed)
?  Problem: Education: ?Goal: Knowledge of disease or condition will improve ?Outcome: Progressing ?  ?Problem: Activity: ?Goal: Ability to tolerate increased activity will improve ?Outcome: Progressing ?  ?Problem: Respiratory: ?Goal: Ability to maintain a clear airway will improve ?Outcome: Progressing ?Goal: Levels of oxygenation will improve ?Outcome: Progressing ?  ?

## 2021-07-12 NOTE — Progress Notes (Signed)
PROGRESS NOTE                                                                                                                                                                                                             Patient Demographics:    Cory Kemp, is a 80 y.o. male, DOB - 03-Jan-1941, KWI:[id number]  Outpatient Primary MD for the patient is Jani Gravel, MD    LOS - 0  Admit date - 07/11/2021    Chief Complaint  Patient presents with   Shortness of Breath       Brief Narrative (HPI from H&P)    Cory Kemp is a 80 y.o. male with medical history significant of COPD, diabetes, hypertension, anemia, OSA, sinus tachycardia, alcohol use, BPH, dementia, hyperlipidemia who presents ongoing shortness of breath he was diagnosed with acute hypoxic respiratory failure due to COPD exacerbation and admitted to the hospital on nonrebreather mask.   Subjective:    Cory Kemp today has, No headache, No chest pain, No abdominal pain - No Nausea, No new weakness tingling or numbness, +ve cough & SOB   Assessment  & Plan :     Acute on chronic hypoxic respiratory failure due to COPD exacerbation - he has been placed on empiric IV steroids, nebulizer treatments along with oxygen supplementation, at home he uses 2 L nasal cannula oxygen on a as needed basis initially was on nonrebreather mask currently on 6 L nasal cannula oxygen, stable leukocyte count and procalcitonin levels hence I do not think he has pneumonia, will continue azithromycin for possible bronchitis.     Encouraged the patient to sit up in chair in the daytime use I-S and flutter valve for pulmonary toiletry.  Will advance activity and titrate down oxygen as possible.  2.  SVT at the time of admission.  Resolved, currently on oral Cardizem, check TSH and echocardiogram   3.  Dyslipidemia.  Continue statin.  4.  Hypertension.  On ARB and low-dose diltiazem.   Monitor.      Condition - Extremely Guarded  Family Communication  :  [place] [phone] on 07/12/21  Code Status : Full  Consults  : None  PUD Prophylaxis : PPI   Procedures  :     CT - 1. Bilateral lower lobe pulmonary scarring/bronchitic changes  with superimposed developing infection/inflammation. 2. Emphysema (ICD10-J43.9) - severe with large bulla measuring up to 13 cm in the right lower lobe. 3. Right hilar lymphadenopathy.  Attention on follow-up. 4. Enlarged main pulmonary artery suggestive of pulmonary hypertension. 5.  Aortic Atherosclerosis      Disposition Plan  :    Status is: Observation   DVT Prophylaxis  :    enoxaparin (LOVENOX) injection 40 mg Start: 07/11/21 2115  Lab Results  Component Value Date   PLT 83 (L) 07/12/2021    Diet :  Diet Order             DIET SOFT Room service appropriate? Yes; Fluid consistency: Nectar Thick  Diet effective now                    Inpatient Medications  Scheduled Meds:  atorvastatin  40 mg Oral Daily   diltiazem  30 mg Oral Q12H   enoxaparin (LOVENOX) injection  40 mg Subcutaneous Q24H   fluticasone furoate-vilanterol  1 puff Inhalation Daily   ipratropium-albuterol  3 mL Nebulization Q6H   losartan  75 mg Oral QHS   methylPREDNISolone (SOLU-MEDROL) injection  60 mg Intravenous Q12H   sodium chloride flush  3 mL Intravenous Q12H   umeclidinium bromide  1 puff Inhalation Daily   Continuous Infusions:  azithromycin     PRN Meds:.acetaminophen **OR** acetaminophen, albuterol, diltiazem, menthol-cetylpyridinium  Antibiotics  :    Anti-infectives (From admission, onward)    Start     Dose/Rate Route Frequency Ordered Stop   07/12/21 1800  cefTRIAXone (ROCEPHIN) 2 g in sodium chloride 0.9 % 100 mL IVPB  Status:  Discontinued        2 g 200 mL/hr over 30 Minutes Intravenous Every 24 hours 07/11/21 2114 07/12/21 1313   07/12/21 1800  azithromycin (ZITHROMAX) 500 mg in sodium chloride 0.9 % 250  mL IVPB        500 mg 250 mL/hr over 60 Minutes Intravenous Every 24 hours 07/11/21 2114 07/17/21 1759   07/11/21 2045  cefTRIAXone (ROCEPHIN) 1 g in sodium chloride 0.9 % 100 mL IVPB        1 g 200 mL/hr over 30 Minutes Intravenous  Once 07/11/21 2030 07/11/21 2140   07/11/21 2045  azithromycin (ZITHROMAX) 500 mg in sodium chloride 0.9 % 250 mL IVPB        500 mg 250 mL/hr over 60 Minutes Intravenous  Once 07/11/21 2030 07/11/21 2246        Time Spent in minutes  30   Lala Lund M.D on 07/12/2021 at 1:14 PM  To page go to [email or web address]   [facility] -  Office  [phone]  See all Orders from today for further details    Objective:   Vitals:   07/12/21 1200 07/12/21 1215 07/12/21 1230 07/12/21 1245  BP: (!) 141/74 140/76 139/85 136/85  Pulse: 88 (!) 117 89 94  Resp: (!) 22 19 (!) 23 (!) 26  Temp:      TempSrc:      SpO2:    95%  Weight:      Height:        Wt Readings from Last 3 Encounters:  07/12/21 88 kg  05/31/21 88 kg  11/24/20 83.9 kg     Intake/Output Summary (Last 24 hours) at 07/12/2021 1314 Last data filed at 07/12/2021 1249 Gross per 24 hour  Intake 1611.94 ml  Output --  Net 1611.94 ml  Physical Exam  Awake Alert, No new F.N deficits, Normal affect [place].AT,PERRAL Supple Neck, No JVD,   Symmetrical Chest wall movement, Mod air movement bilaterally, few rales - minimal wheezing RRR,No Gallops,Rubs or new Murmurs,  +ve B.Sounds, Abd Soft, No tenderness,   No Cyanosis, Clubbing or edema       Data Review:    CBC Recent Labs  Lab 07/11/21 1816 07/11/21 1818 07/11/21 1819 07/11/21 2300 07/12/21 0448  WBC 2.7*  --   --   --  6.4  HGB 13.7 14.6 14.3 12.9* 12.3*  HCT 43.4 43.0 42.0 38.0* 38.5*  PLT 89*  --   --   --  83*  MCV 91.0  --   --   --  90.0  MCH 28.7  --   --   --  28.7  MCHC 31.6  --   --   --  31.9  RDW 15.0  --   --   --  15.1  LYMPHSABS 0.5*  --   --   --   --   MONOABS 0.8  --   --   --   --   EOSABS  0.0  --   --   --   --   BASOSABS 0.0  --   --   --   --     Electrolytes Recent Labs  Lab 07/11/21 1816 07/11/21 1818 07/11/21 1819 07/11/21 2127 07/11/21 2300 07/12/21 0448  NA 138 139 139  --  138 135  K 4.5 4.2 4.2  --  4.3 4.2  CL 102 103  --   --   --  105  CO2 25  --   --   --   --  24  GLUCOSE 190* 186*  --   --   --  176*  BUN 15 18  --   --   --  17  CREATININE 0.98 0.90  --   --   --  0.94  CALCIUM 8.6*  --   --   --   --  8.1*  AST 29  --   --   --   --  23  ALT 13  --   --   --   --  14  ALKPHOS 78  --   --   --   --  63  BILITOT 0.9  --   --   --   --  0.3  ALBUMIN 3.9  --   --   --   --  3.3*  PROCALCITON  --   --   --  0.23  --  0.67  LATICACIDVEN  --   --   --  1.3  --   --   BNP 138.0*  --   --   --   --   --     ------------------------------------------------------------------------------------------------------------------ No results for input(s): CHOL, HDL, LDLCALC, TRIG, CHOLHDL, LDLDIRECT in the last 72 hours.  Lab Results  Component Value Date   HGBA1C 5.9 (H) 04/20/2020    No results for input(s): TSH, T4TOTAL, T3FREE, THYROIDAB in the last 72 hours.  Invalid input(s): FREET3 ------------------------------------------------------------------------------------------------------------------ ID Labs Recent Labs  Lab 07/11/21 1816 07/11/21 1818 07/11/21 2127 07/12/21 0448  WBC 2.7*  --   --  6.4  PLT 89*  --   --  83*  PROCALCITON  --   --  0.23 0.67  LATICACIDVEN  --   --  1.3  --   CREATININE  0.98 0.90  --  0.94   Cardiac Enzymes No results for input(s): CKMB, TROPONINI, MYOGLOBIN in the last 168 hours.  Invalid input(s): CK    Radiology Reports CT Angio Chest PE W and/or Wo Contrast  Result Date: 07/11/2021 CLINICAL DATA:  Pt c/o increased SOB x 1 day. Pt had O2 sat in 60s in triage. PE suspected, high prob EXAM: CT ANGIOGRAPHY CHEST WITH CONTRAST TECHNIQUE: Multidetector CT imaging of the chest was performed using the  standard protocol during bolus administration of intravenous contrast. Multiplanar CT image reconstructions and MIPs were obtained to evaluate the vascular anatomy. CONTRAST:  57mL ISOVUE-370 IOPAMIDOL (ISOVUE-370) INJECTION 76% COMPARISON:  CT angio chest 02/11/2019 FINDINGS: Cardiovascular: Satisfactory opacification of the pulmonary arteries to the segmental level. Limited evaluation of the subsegmental level due to motion artifact. No evidence of pulmonary embolism. The main pulmonary artery is enlarged in caliber. Normal heart size. No significant pericardial effusion. The thoracic aorta is normal in caliber. Severe atherosclerotic plaque of the thoracic aorta. At least 3 vessel coronary artery calcifications. Aortic valve leaflet calcifications. Question aortic arch stent. Mediastinum/Nodes: Enlarged right hilar lymph node measuring up to 2.2 cm. No enlarged mediastinal, hilar, or axillary lymph nodes. Thyroid gland, trachea, and esophagus demonstrate no significant findings. Lungs/Pleura: Severe paraseptal and centrilobular emphysematous changes with a large right lower lobe basilar bulla measuring up to 13 cm. Bilateral lower lobe mild architectural distortion and traction bronchiectasis with superimposed bronchial wall thickening and mucous plugging. Upper Abdomen: Partially visualized fluid density lesion within the right kidney measuring up to at least 5.5 cm. No acute abnormality. Musculoskeletal: No chest wall abnormality. No suspicious lytic or blastic osseous lesions. No acute displaced fracture. Review of the MIP images confirms the above findings. IMPRESSION: 1. Bilateral lower lobe pulmonary scarring/bronchitic changes with superimposed developing infection/inflammation. 2. Emphysema (ICD10-J43.9) - severe with large bulla measuring up to 13 cm in the right lower lobe. 3. Right hilar lymphadenopathy.  Attention on follow-up. 4. Enlarged main pulmonary artery suggestive of pulmonary hypertension. 5.   Aortic Atherosclerosis (ICD10-I70.0). Electronically Signed   By: Iven Finn M.D.   On: 07/11/2021 20:20   DG Chest Port 1 View  Result Date: 07/12/2021 CLINICAL DATA:  Shortness of breath and cough EXAM: PORTABLE CHEST 1 VIEW COMPARISON:  07/11/2021 FINDINGS: Stable cardiomediastinal contours. No pleural effusion or edema. Chronic interstitial changes of COPD/emphysema noted. Mild bibasilar atelectasis appears unchanged. IMPRESSION: 1. Stable bibasilar atelectasis. 2. COPD/emphysema. Electronically Signed   By: Kerby Moors M.D.   On: 07/12/2021 06:49   DG Chest Port 1 View  Result Date: 07/11/2021 CLINICAL DATA:  Shortness of breath, hypoxia history COPD. EXAM: PORTABLE CHEST 1 VIEW COMPARISON:  PA Lat 11/26/2020. FINDINGS: The cardiac size is normal. The aorta is tortuous and calcified but unchanged in configuration. Lungs are mildly emphysematous with chronic scar-like opacities in the lower lung fields, chronic slight blunting of the right lateral sulcus which could be a tiny chronic pleural effusion or pleurodiaphragmatic adhesion. The left sulci are sharp. The current study demonstrates increased streaky atelectasis or infiltrate in the left infrahilar/retrocardiac area with partial obscuration of the adjacent diaphragm. No other new lung opacity is seen. Osteopenia and thoracic spondylosis. IMPRESSION: COPD and scarring changes, with increased retrocardiac/infrahilar left lower lobe opacity concerning for pneumonia or aspiration versus atelectasis. Clinical correlation and PA Lat radiographic follow-up recommended. Electronically Signed   By: Telford Nab M.D.   On: 07/11/2021 20:13

## 2021-07-13 ENCOUNTER — Inpatient Hospital Stay (HOSPITAL_COMMUNITY): Payer: Medicare HMO

## 2021-07-13 DIAGNOSIS — I428 Other cardiomyopathies: Secondary | ICD-10-CM | POA: Diagnosis not present

## 2021-07-13 LAB — CBC WITH DIFFERENTIAL/PLATELET
Abs Immature Granulocytes: 0.08 K/uL — ABNORMAL HIGH (ref 0.00–0.07)
Basophils Absolute: 0 K/uL (ref 0.0–0.1)
Basophils Relative: 0 %
Eosinophils Absolute: 0 K/uL (ref 0.0–0.5)
Eosinophils Relative: 0 %
HCT: 38.6 % — ABNORMAL LOW (ref 39.0–52.0)
Hemoglobin: 12.6 g/dL — ABNORMAL LOW (ref 13.0–17.0)
Immature Granulocytes: 1 %
Lymphocytes Relative: 9 %
Lymphs Abs: 0.6 K/uL — ABNORMAL LOW (ref 0.7–4.0)
MCH: 29 pg (ref 26.0–34.0)
MCHC: 32.6 g/dL (ref 30.0–36.0)
MCV: 88.9 fL (ref 80.0–100.0)
Monocytes Absolute: 0.4 K/uL (ref 0.1–1.0)
Monocytes Relative: 7 %
Neutro Abs: 5.3 K/uL (ref 1.7–7.7)
Neutrophils Relative %: 83 %
Platelets: 87 K/uL — ABNORMAL LOW (ref 150–400)
RBC: 4.34 MIL/uL (ref 4.22–5.81)
RDW: 15.1 % (ref 11.5–15.5)
WBC: 6.4 K/uL (ref 4.0–10.5)
nRBC: 0 % (ref 0.0–0.2)

## 2021-07-13 LAB — ECHOCARDIOGRAM COMPLETE
Area-P 1/2: 4.5 cm2
Calc EF: 58.6 %
Height: 73 in
S' Lateral: 4.1 cm
Single Plane A2C EF: 64.5 %
Single Plane A4C EF: 53.7 %
Weight: 3195.2 oz

## 2021-07-13 LAB — COMPREHENSIVE METABOLIC PANEL
ALT: 17 U/L (ref 0–44)
AST: 33 U/L (ref 15–41)
Albumin: 3.3 g/dL — ABNORMAL LOW (ref 3.5–5.0)
Alkaline Phosphatase: 56 U/L (ref 38–126)
Anion gap: 8 (ref 5–15)
BUN: 24 mg/dL — ABNORMAL HIGH (ref 8–23)
CO2: 25 mmol/L (ref 22–32)
Calcium: 8.3 mg/dL — ABNORMAL LOW (ref 8.9–10.3)
Chloride: 102 mmol/L (ref 98–111)
Creatinine, Ser: 1 mg/dL (ref 0.61–1.24)
GFR, Estimated: >60 mL/min (ref >60)
Glucose, Bld: 171 mg/dL — ABNORMAL HIGH (ref 70–99)
Potassium: 4.4 mmol/L (ref 3.5–5.1)
Sodium: 135 mmol/L (ref 135–145)
Total Bilirubin: 0.6 mg/dL (ref 0.3–1.2)
Total Protein: 6.4 g/dL — ABNORMAL LOW (ref 6.5–8.1)

## 2021-07-13 LAB — MAGNESIUM: Magnesium: 2.3 mg/dL (ref 1.7–2.4)

## 2021-07-13 LAB — C-REACTIVE PROTEIN: CRP: 10.3 mg/dL — ABNORMAL HIGH

## 2021-07-13 LAB — PROCALCITONIN: Procalcitonin: 0.6 ng/mL

## 2021-07-13 LAB — BRAIN NATRIURETIC PEPTIDE: B Natriuretic Peptide: 122.1 pg/mL — ABNORMAL HIGH (ref 0.0–100.0)

## 2021-07-13 LAB — TSH: TSH: 0.165 u[IU]/mL — ABNORMAL LOW (ref 0.350–4.500)

## 2021-07-13 LAB — T4, FREE: Free T4: 1.15 ng/dL — ABNORMAL HIGH (ref 0.61–1.12)

## 2021-07-13 MED ORDER — PERFLUTREN LIPID MICROSPHERE
1.0000 mL | INTRAVENOUS | Status: AC | PRN
Start: 2021-07-13 — End: 2021-07-13
  Administered 2021-07-13: 2 mL via INTRAVENOUS
  Filled 2021-07-13: qty 10

## 2021-07-13 MED ORDER — ALBUTEROL SULFATE (2.5 MG/3ML) 0.083% IN NEBU: 2.5000 mg | INHALATION_SOLUTION | RESPIRATORY_TRACT | Status: DC | PRN

## 2021-07-13 MED ORDER — PHENOL 1.4 % MT LIQD
1.0000 | Freq: Four times a day (QID) | OROMUCOSAL | Status: DC | PRN
  Administered 2021-07-13: 1 via OROMUCOSAL
  Filled 2021-07-13 (×2): qty 177

## 2021-07-13 MED ORDER — CLONIDINE HCL 0.1 MG PO TABS
0.1000 mg | ORAL_TABLET | Freq: Four times a day (QID) | ORAL | Status: DC | PRN
  Administered 2021-07-13 – 2021-07-15 (×3): 0.1 mg via ORAL
  Filled 2021-07-13 (×3): qty 1

## 2021-07-13 MED ORDER — LOSARTAN POTASSIUM 25 MG PO TABS
25.0000 mg | ORAL_TABLET | Freq: Every day | ORAL | Status: DC
  Administered 2021-07-13 – 2021-07-16 (×4): 25 mg via ORAL
  Filled 2021-07-13 (×4): qty 1

## 2021-07-13 MED ORDER — ESCITALOPRAM OXALATE 10 MG PO TABS
20.0000 mg | ORAL_TABLET | Freq: Every day | ORAL | Status: DC
  Administered 2021-07-13 – 2021-07-16 (×4): 20 mg via ORAL
  Filled 2021-07-13 (×4): qty 2

## 2021-07-13 MED ORDER — METHIMAZOLE 5 MG PO TABS
5.0000 mg | ORAL_TABLET | Freq: Two times a day (BID) | ORAL | Status: DC
  Administered 2021-07-13 – 2021-07-16 (×7): 5 mg via ORAL
  Filled 2021-07-13 (×9): qty 1

## 2021-07-13 MED ORDER — CARVEDILOL 6.25 MG PO TABS
6.2500 mg | ORAL_TABLET | Freq: Two times a day (BID) | ORAL | Status: DC
  Administered 2021-07-13 – 2021-07-14 (×4): 6.25 mg via ORAL
  Filled 2021-07-13 (×4): qty 1

## 2021-07-13 MED ORDER — ALBUTEROL SULFATE (2.5 MG/3ML) 0.083% IN NEBU: 2.5000 mg | INHALATION_SOLUTION | Freq: Four times a day (QID) | RESPIRATORY_TRACT | Status: DC

## 2021-07-13 MED ORDER — SIMVASTATIN 20 MG PO TABS
40.0000 mg | ORAL_TABLET | Freq: Every evening | ORAL | Status: DC
  Administered 2021-07-13 – 2021-07-15 (×3): 40 mg via ORAL
  Filled 2021-07-13 (×3): qty 2

## 2021-07-13 MED ORDER — ALBUTEROL SULFATE (2.5 MG/3ML) 0.083% IN NEBU
2.5000 mg | INHALATION_SOLUTION | RESPIRATORY_TRACT | Status: DC | PRN
  Administered 2021-07-15: 2.5 mg via RESPIRATORY_TRACT
  Filled 2021-07-13: qty 3

## 2021-07-13 MED ORDER — MONTELUKAST SODIUM 10 MG PO TABS
10.0000 mg | ORAL_TABLET | Freq: Every day | ORAL | Status: DC
  Administered 2021-07-13 – 2021-07-16 (×4): 10 mg via ORAL
  Filled 2021-07-13 (×4): qty 1

## 2021-07-13 NOTE — Progress Notes (Signed)
  Echocardiogram 2D Echocardiogram has been performed.  Cory Kemp 07/13/2021, 2:12 PM

## 2021-07-13 NOTE — Progress Notes (Signed)
Mobility Specialist Progress Note:   07/13/21 1235  Mobility  Activity Ambulated in hall  Level of Assistance Standby assist, set-up cues, supervision of patient - no hands on  Assistive Device Four wheel walker  Distance Ambulated (ft) 60 ft  Mobility Ambulated with assistance in hallway  Mobility Response Tolerated well  Mobility performed by Mobility specialist  Bed Position Chair  $Mobility charge 1 Mobility   Pt received in chair willing to participate in mobility. No complaints of pain. Pt returned to chair with call bell in reach and all needs met.   Post mobility: SpO2 92%   [facility] Phone [phone] Secondary Phone [phone]

## 2021-07-13 NOTE — Progress Notes (Signed)
Physical Therapy Treatment Patient Details Name: Cory Kemp MRN: [id number] DOB: 02/28/1941 Today's Date: 07/13/2021   History of Present Illness Pt is an 80 year old man admitted on 07/11/21 with SOB and hypoxia with Sp02 in the 60s. PMH: COPD, DM, HTN, anemia, OSA, sinus tachycardia, HLD, alcohol abuse, BPH.    PT Comments    Pt making steady progress with mobility. Activity/ambulation continues to be limited by SOB. Pt has to pace himself with activity.    Recommendations for follow up therapy are one component of a multi-disciplinary discharge planning process, led by the attending physician.  Recommendations may be updated based on patient status, additional functional criteria and insurance authorization.  Follow Up Recommendations  Home health PT     Assistance Recommended at Discharge Intermittent Supervision/Assistance  Equipment Recommendations  None recommended by PT    Recommendations for Other Services       Precautions / Restrictions Precautions Precautions: Fall Precaution Comments: watch 02     Mobility  Bed Mobility Overal bed mobility: Modified Independent                  Transfers Overall transfer level: Needs assistance Equipment used: Rollator (4 wheels) Transfers: Sit to/from Stand Sit to Stand: Min guard           General transfer comment: min guard assist for safety    Ambulation/Gait Ambulation/Gait assistance: Min guard Gait Distance (Feet): 30 Feet Assistive device: Rollator (4 wheels) Gait Pattern/deviations: Step-through pattern;Decreased stride length Gait velocity: Decreased Gait velocity interpretation: 1.31 - 2.62 ft/sec, indicative of limited community ambulator   General Gait Details: Assist for safety and lines. Distance limited by SOB.   Stairs             Wheelchair Mobility    Modified Rankin (Stroke Patients Only)       Balance Overall balance assessment: Needs assistance Sitting-balance  support: No upper extremity supported Sitting balance-Leahy Scale: Good     Standing balance support: No upper extremity supported Standing balance-Leahy Scale: Fair                              Cognition Arousal/Alertness: Awake/alert Behavior During Therapy: Impulsive Overall Cognitive Status: Impaired/Different from baseline Area of Impairment: Safety/judgement                         Safety/Judgement: Decreased awareness of safety              Exercises      General Comments        Pertinent Vitals/Pain Pain Assessment: No/denies pain    Home Living                          Prior Function            PT Goals (current goals can now be found in the care plan section) Acute Rehab PT Goals Patient Stated Goal: to feel better and go home Progress towards PT goals: Progressing toward goals    Frequency    Min 3X/week      PT Plan Current plan remains appropriate    Co-evaluation              AM-PAC PT "6 Clicks" Mobility   Outcome Measure  Help needed turning from your back to your side while in a flat bed without using bedrails?:  None Help needed moving from lying on your back to sitting on the side of a flat bed without using bedrails?: None Help needed moving to and from a bed to a chair (including a wheelchair)?: A Little Help needed standing up from a chair using your arms (e.g., wheelchair or bedside chair)?: A Little Help needed to walk in hospital room?: A Little Help needed climbing 3-5 steps with a railing? : A Little 6 Click Score: 20    End of Session Equipment Utilized During Treatment: Oxygen Activity Tolerance: Treatment limited secondary to medical complications (Comment);Patient limited by fatigue (SOB) Patient left: in bed;with call bell/phone within reach (sitting edge of stretcher on ED) Nurse Communication: Mobility status PT Visit Diagnosis: Unsteadiness on feet (R26.81);Muscle weakness  (generalized) (M62.81)     Time: 4403-4742 PT Time Calculation (min) (ACUTE ONLY): 15 min  Charges:  $Gait Training: 8-22 mins                     [place] Pager [phone] Office [place] 07/13/2021, 4:51 PM

## 2021-07-13 NOTE — Progress Notes (Signed)
Occupational Therapy Progress Note  Pt is slowly progressing towards OT goals. During session, pt completed LB dressing and grooming at sink with supervision - Min guard. SpO2 min 90% and HR max 113 bpm. Continues to demonstrate some impulsivity, however aware of need to take rest breaks. Will continue to follow.    07/13/21 1700  OT Visit Information  Last OT Received On 07/13/21  Assistance Needed +1  History of Present Illness Pt is an 80 year old man admitted on 07/11/21 with SOB and hypoxia with Sp02 in the 60s. PMH: COPD, DM, HTN, anemia, OSA, sinus tachycardia, HLD, alcohol abuse, BPH.  Precautions  Precautions Fall  Precaution Comments watch 02  Restrictions  Weight Bearing Restrictions No  Pain Assessment  Pain Assessment No/denies pain  Cognition  Arousal/Alertness Awake/alert  Behavior During Therapy Impulsive  Overall Cognitive Status Impaired/Different from baseline  Area of Impairment Safety/judgement  Safety/Judgement Decreased awareness of safety  ADL  Overall ADL's  Needs assistance/impaired  Grooming Supervision/safety;Wash/dry face;Wash/dry hands;Standing  Grooming Details (indicate cue type and reason) At sink  Lower Body Dressing Min guard;Sit to/from stand  Lower Body Dressing Details (indicate cue type and reason) Donned/doffed socks and pants at EOB, standing to pull pants over hips  Bed Mobility  Overal bed mobility Modified Independent  Transfers  Overall transfer level Needs assistance  Equipment used None  Transfers Sit to/from Stand  Sit to Stand Min guard  Balance  Overall balance assessment Needs assistance  Sitting-balance support No upper extremity supported  Sitting balance-Leahy Scale Good  Standing balance support No upper extremity supported  Standing balance-Leahy Scale Fair  OT - End of Session  Equipment Utilized During Treatment Oxygen  Activity Tolerance Patient limited by fatigue  Patient left in bed;with call bell/phone within  reach  OT Assessment/Plan  OT Plan Discharge plan remains appropriate;Frequency remains appropriate  OT Visit Diagnosis Unsteadiness on feet (R26.81);Pain;Other (comment)  OT Frequency (ACUTE ONLY) Min 2X/week  Follow Up Recommendations Home health OT  Assistance recommended at discharge Intermittent Supervision/Assistance  OT Equipment None recommended by OT  AM-PAC OT "6 Clicks" Daily Activity Outcome Measure (Version 2)  Help from another person eating meals? 4  Help from another person taking care of personal grooming? 3  Help from another person toileting, which includes using toliet, bedpan, or urinal? 3  Help from another person bathing (including washing, rinsing, drying)? 3  Help from another person to put on and taking off regular upper body clothing? 3  Help from another person to put on and taking off regular lower body clothing? 3  6 Click Score 19  Progressive Mobility  What is the highest level of mobility based on the progressive mobility assessment? Level 4 (Walks with assist in room) - Balance while marching in place and cannot step forward and back - Complete  Mobility Out of bed for toileting;Out of bed to chair with meals;Ambulated with assistance in room  OT Goal Progression  Progress towards OT goals Progressing toward goals  Acute Rehab OT Goals  OT Goal Formulation With patient  Time For Goal Achievement 07/26/21  Potential to Achieve Goals Good  ADL Goals  Pt Will Perform Grooming with modified independence;standing  Pt Will Perform Lower Body Bathing with modified independence;sit to/from stand  Pt Will Perform Lower Body Dressing with modified independence;sit to/from stand  Pt Will Transfer to Toilet with modified independence;regular height toilet  Pt Will Perform Toileting - Clothing Manipulation and hygiene with modified independence;sit to/from stand  Additional ADL Goal #1 Pt will state at least 3 energy conservation strategies.  OT Time Calculation   OT Start Time (ACUTE ONLY) 1642  OT Stop Time (ACUTE ONLY) 1700  OT Time Calculation (min) 18 min  OT General Charges  $OT Visit 1 Visit  OT Treatments  $Self Care/Home Management  8-22 mins   Quynh Basso C, OT/L  Acute Rehab [phone]

## 2021-07-13 NOTE — Progress Notes (Signed)
PROGRESS NOTE                                                                                                                                                                                                             Patient Demographics:    Cory Kemp, is a 80 y.o. male, DOB - 10-22-1940, PZW:[id number]  Outpatient Primary MD for the patient is Jani Gravel, MD    LOS - 1  Admit date - 07/11/2021    Chief Complaint  Patient presents with   Shortness of Breath       Brief Narrative (HPI from H&P)    Cory Kemp is a 80 y.o. male with medical history significant of COPD, diabetes, hypertension, anemia, OSA, sinus tachycardia, alcohol use, BPH, dementia, hyperlipidemia who presents ongoing shortness of breath he was diagnosed with acute hypoxic respiratory failure due to COPD exacerbation and admitted to the hospital on nonrebreather mask.   Subjective:   Patient in bed denies any headache, no chest or abdominal pain, cough shortness of breath and wheezing are gradually improving.   Assessment  & Plan :     Acute on chronic hypoxic respiratory failure due to COPD exacerbation - he has been placed on empiric IV steroids, nebulizer treatments along with oxygen supplementation, at home he uses 2 L nasal cannula oxygen on a as needed basis initially was on nonrebreather mask currently on 6 L nasal cannula oxygen, stable leukocyte count and procalcitonin levels hence I do not think he has pneumonia, will continue azithromycin for possible bronchitis.     Encouraged the patient to sit up in chair in the daytime use I-S and flutter valve for pulmonary toiletry.  Will advance activity and titrate down oxygen as possible.  2.  SVT at the time of admission.  Resolved, currently on oral Cardizem, TSH as in #5 below, check echocardiogram  3.  Dyslipidemia.  Continue statin.  4.  Hypertension.  Based on Cardizem and low-dose ARB will  continue to monitor.  5.  Hypothyroidism with low TSH and high free T4, placed on low-dose methimazole.  Outpatient endocrine follow-up.      Condition - Extremely Guarded  Family Communication  :  Barret Esquivel [phone] on 07/12/21  Code Status : Full  Consults  : None  PUD Prophylaxis : PPI  Procedures  :     Echocardiogram.    CT - 1. Bilateral lower lobe pulmonary scarring/bronchitic changes with superimposed developing infection/inflammation. 2. Emphysema (ICD10-J43.9) - severe with large bulla measuring up to 13 cm in the right lower lobe. 3. Right hilar lymphadenopathy.  Attention on follow-up. 4. Enlarged main pulmonary artery suggestive of pulmonary hypertension. 5.  Aortic Atherosclerosis      Disposition Plan  :    Status is: Observation   DVT Prophylaxis  :    enoxaparin (LOVENOX) injection 40 mg Start: 07/11/21 2115  Lab Results  Component Value Date   PLT 87 (L) 07/13/2021    Diet :  Diet Order             Diet Heart Room service appropriate? Yes; Fluid consistency: Thin  Diet effective now                    Inpatient Medications  Scheduled Meds:  carvedilol  6.25 mg Oral BID WC   enoxaparin (LOVENOX) injection  40 mg Subcutaneous Q24H   escitalopram  20 mg Oral Daily   fluticasone furoate-vilanterol  1 puff Inhalation Daily   ipratropium-albuterol  3 mL Nebulization Q6H   losartan  25 mg Oral Daily   mouth rinse  15 mL Mouth Rinse BID   methimazole  5 mg Oral BID   methylPREDNISolone (SOLU-MEDROL) injection  60 mg Intravenous Q12H   montelukast  10 mg Oral Daily   pantoprazole  40 mg Oral Daily   simvastatin  40 mg Oral QPM   sodium chloride flush  3 mL Intravenous Q12H   umeclidinium bromide  1 puff Inhalation Daily   Continuous Infusions:  azithromycin 500 mg (07/12/21 1747)   PRN Meds:.acetaminophen **OR** acetaminophen, albuterol, albuterol, alum & mag hydroxide-simeth, diltiazem, menthol-cetylpyridinium,  traZODone  Antibiotics  :    Anti-infectives (From admission, onward)    Start     Dose/Rate Route Frequency Ordered Stop   07/12/21 1800  cefTRIAXone (ROCEPHIN) 2 g in sodium chloride 0.9 % 100 mL IVPB  Status:  Discontinued        2 g 200 mL/hr over 30 Minutes Intravenous Every 24 hours 07/11/21 2114 07/12/21 1313   07/12/21 1800  azithromycin (ZITHROMAX) 500 mg in sodium chloride 0.9 % 250 mL IVPB        500 mg 250 mL/hr over 60 Minutes Intravenous Every 24 hours 07/11/21 2114 07/17/21 1759   07/11/21 2045  cefTRIAXone (ROCEPHIN) 1 g in sodium chloride 0.9 % 100 mL IVPB        1 g 200 mL/hr over 30 Minutes Intravenous  Once 07/11/21 2030 07/11/21 2140   07/11/21 2045  azithromycin (ZITHROMAX) 500 mg in sodium chloride 0.9 % 250 mL IVPB        500 mg 250 mL/hr over 60 Minutes Intravenous  Once 07/11/21 2030 07/11/21 2246        Time Spent in minutes  30   Lala Lund M.D on 07/13/2021 at 9:10 AM  To page go to [email or web address]   [facility] -  Office  [phone]  See all Orders from today for further details    Objective:   Vitals:   07/13/21 0236 07/13/21 0420 07/13/21 0733 07/13/21 0845  BP:  (!) 168/76 (!) 156/73   Pulse:  67 87   Resp:  20    Temp:  98.8 F (37.1 C) 98.2 F (36.8 C)   TempSrc:   Oral   SpO2: 98%  95% 98% 96%  Weight:  90.6 kg    Height:        Wt Readings from Last 3 Encounters:  07/13/21 90.6 kg  05/31/21 88 kg  11/24/20 83.9 kg     Intake/Output Summary (Last 24 hours) at 07/13/2021 0910 Last data filed at 07/13/2021 0816 Gross per 24 hour  Intake 1303.8 ml  Output 1200 ml  Net 103.8 ml     Physical Exam  Awake Alert, No new F.N deficits, Normal affect [place].AT,PERRAL Supple Neck, No JVD,   Symmetrical Chest wall movement, Mod air movement bilaterally, mild wheezing RRR,No Gallops, Rubs or new Murmurs,  +ve B.Sounds, Abd Soft, No tenderness,   No Cyanosis, Clubbing or edema        Data Review:    CBC Recent  Labs  Lab 07/11/21 1816 07/11/21 1818 07/11/21 1819 07/11/21 2300 07/12/21 0448 07/13/21 0054  WBC 2.7*  --   --   --  6.4 6.4  HGB 13.7 14.6 14.3 12.9* 12.3* 12.6*  HCT 43.4 43.0 42.0 38.0* 38.5* 38.6*  PLT 89*  --   --   --  83* 87*  MCV 91.0  --   --   --  90.0 88.9  MCH 28.7  --   --   --  28.7 29.0  MCHC 31.6  --   --   --  31.9 32.6  RDW 15.0  --   --   --  15.1 15.1  LYMPHSABS 0.5*  --   --   --   --  0.6*  MONOABS 0.8  --   --   --   --  0.4  EOSABS 0.0  --   --   --   --  0.0  BASOSABS 0.0  --   --   --   --  0.0    Electrolytes Recent Labs  Lab 07/11/21 1816 07/11/21 1818 07/11/21 1819 07/11/21 2127 07/11/21 2300 07/12/21 0448 07/12/21 1434 07/13/21 0054  NA 138 139 139  --  138 135  --  135  K 4.5 4.2 4.2  --  4.3 4.2  --  4.4  CL 102 103  --   --   --  105  --  102  CO2 25  --   --   --   --  24  --  25  GLUCOSE 190* 186*  --   --   --  176*  --  171*  BUN 15 18  --   --   --  17  --  24*  CREATININE 0.98 0.90  --   --   --  0.94  --  1.00  CALCIUM 8.6*  --   --   --   --  8.1*  --  8.3*  AST 29  --   --   --   --  23  --  33  ALT 13  --   --   --   --  14  --  17  ALKPHOS 78  --   --   --   --  63  --  56  BILITOT 0.9  --   --   --   --  0.3  --  0.6  ALBUMIN 3.9  --   --   --   --  3.3*  --  3.3*  MG  --   --   --   --   --   --  2.2 2.3  CRP  --   --   --   --   --   --  11.4* 10.3*  PROCALCITON  --   --   --  0.23  --  0.67  --  0.60  LATICACIDVEN  --   --   --  1.3  --   --   --   --   TSH  --   --   --   --   --   --   --  0.165*  BNP 138.0*  --   --   --   --   --  196.3* 122.1*    ------------------------------------------------------------------------------------------------------------------ No results for input(s): CHOL, HDL, LDLCALC, TRIG, CHOLHDL, LDLDIRECT in the last 72 hours.  Lab Results  Component Value Date   HGBA1C 5.9 (H) 04/20/2020    Recent Labs    07/13/21 0054  TSH 0.165*    ------------------------------------------------------------------------------------------------------------------ ID Labs Recent Labs  Lab 07/11/21 1816 07/11/21 1818 07/11/21 2127 07/12/21 0448 07/12/21 1434 07/13/21 0054  WBC 2.7*  --   --  6.4  --  6.4  PLT 89*  --   --  83*  --  87*  CRP  --   --   --   --  11.4* 10.3*  PROCALCITON  --   --  0.23 0.67  --  0.60  LATICACIDVEN  --   --  1.3  --   --   --   CREATININE 0.98 0.90  --  0.94  --  1.00   Cardiac Enzymes No results for input(s): CKMB, TROPONINI, MYOGLOBIN in the last 168 hours.  Invalid input(s): CK    Radiology Reports CT Angio Chest PE W and/or Wo Contrast  Result Date: 07/11/2021 CLINICAL DATA:  Pt c/o increased SOB x 1 day. Pt had O2 sat in 60s in triage. PE suspected, high prob EXAM: CT ANGIOGRAPHY CHEST WITH CONTRAST TECHNIQUE: Multidetector CT imaging of the chest was performed using the standard protocol during bolus administration of intravenous contrast. Multiplanar CT image reconstructions and MIPs were obtained to evaluate the vascular anatomy. CONTRAST:  33mL ISOVUE-370 IOPAMIDOL (ISOVUE-370) INJECTION 76% COMPARISON:  CT angio chest 02/11/2019 FINDINGS: Cardiovascular: Satisfactory opacification of the pulmonary arteries to the segmental level. Limited evaluation of the subsegmental level due to motion artifact. No evidence of pulmonary embolism. The main pulmonary artery is enlarged in caliber. Normal heart size. No significant pericardial effusion. The thoracic aorta is normal in caliber. Severe atherosclerotic plaque of the thoracic aorta. At least 3 vessel coronary artery calcifications. Aortic valve leaflet calcifications. Question aortic arch stent. Mediastinum/Nodes: Enlarged right hilar lymph node measuring up to 2.2 cm. No enlarged mediastinal, hilar, or axillary lymph nodes. Thyroid gland, trachea, and esophagus demonstrate no significant findings. Lungs/Pleura: Severe paraseptal and centrilobular  emphysematous changes with a large right lower lobe basilar bulla measuring up to 13 cm. Bilateral lower lobe mild architectural distortion and traction bronchiectasis with superimposed bronchial wall thickening and mucous plugging. Upper Abdomen: Partially visualized fluid density lesion within the right kidney measuring up to at least 5.5 cm. No acute abnormality. Musculoskeletal: No chest wall abnormality. No suspicious lytic or blastic osseous lesions. No acute displaced fracture. Review of the MIP images confirms the above findings. IMPRESSION: 1. Bilateral lower lobe pulmonary scarring/bronchitic changes with superimposed developing infection/inflammation. 2. Emphysema (ICD10-J43.9) - severe with large bulla measuring up to 13 cm in the right lower lobe. 3. Right hilar lymphadenopathy.  Attention on follow-up. 4.  Enlarged main pulmonary artery suggestive of pulmonary hypertension. 5.  Aortic Atherosclerosis (ICD10-I70.0). Electronically Signed   By: Iven Finn M.D.   On: 07/11/2021 20:20   DG Chest Port 1 View  Result Date: 07/13/2021 CLINICAL DATA:  Shortness of breath EXAM: PORTABLE CHEST 1 VIEW COMPARISON:  Previous studies including the examination of 07/12/2021 FINDINGS: Transverse diameter of heart is increased. Thoracic aorta is tortuous and ectatic. Central pulmonary vessels are prominent. Increased interstitial markings are seen in the both parahilar regions and lower lung fields. No focal pulmonary consolidation is seen. There are no signs of alveolar pulmonary edema. Low position of diaphragms suggests COPD. Bullous emphysema is seen in the right lower lung fields. Small transverse linear densities in the left lower lung fields suggest subsegmental atelectasis. There is minimal blunting of lateral CP angles. There is no pneumothorax. IMPRESSION: COPD. Central pulmonary vessels are prominent without signs of alveolar pulmonary edema. Increased interstitial markings are seen in the parahilar  regions and lower lung fields suggesting bronchitis or interstitial pneumonitis. No focal pulmonary consolidation is seen. Electronically Signed   By: Elmer Picker M.D.   On: 07/13/2021 08:07   DG Chest Port 1 View  Result Date: 07/12/2021 CLINICAL DATA:  Shortness of breath and cough EXAM: PORTABLE CHEST 1 VIEW COMPARISON:  07/11/2021 FINDINGS: Stable cardiomediastinal contours. No pleural effusion or edema. Chronic interstitial changes of COPD/emphysema noted. Mild bibasilar atelectasis appears unchanged. IMPRESSION: 1. Stable bibasilar atelectasis. 2. COPD/emphysema. Electronically Signed   By: Kerby Moors M.D.   On: 07/12/2021 06:49   DG Chest Port 1 View  Result Date: 07/11/2021 CLINICAL DATA:  Shortness of breath, hypoxia history COPD. EXAM: PORTABLE CHEST 1 VIEW COMPARISON:  PA Lat 11/26/2020. FINDINGS: The cardiac size is normal. The aorta is tortuous and calcified but unchanged in configuration. Lungs are mildly emphysematous with chronic scar-like opacities in the lower lung fields, chronic slight blunting of the right lateral sulcus which could be a tiny chronic pleural effusion or pleurodiaphragmatic adhesion. The left sulci are sharp. The current study demonstrates increased streaky atelectasis or infiltrate in the left infrahilar/retrocardiac area with partial obscuration of the adjacent diaphragm. No other new lung opacity is seen. Osteopenia and thoracic spondylosis. IMPRESSION: COPD and scarring changes, with increased retrocardiac/infrahilar left lower lobe opacity concerning for pneumonia or aspiration versus atelectasis. Clinical correlation and PA Lat radiographic follow-up recommended. Electronically Signed   By: Telford Nab M.D.   On: 07/11/2021 20:13

## 2021-07-13 NOTE — TOC Progression Note (Signed)
Transition of Care [facility]) - Progression Note    Patient Details  Name: Cory Kemp MRN: [id number] Date of Birth: 01/22/1941  [facility] CM/SW Contact  Zenon Mayo, RN Phone Number: 07/13/2021, 9:32 AM  Clinical Narrative:    NCM spoke with patient at bedside, he lives with relatives and friends per patient, he states he still drives.  Per pt eval rec HHPT/HHOT.  He has rollaotr and oxygen at home ([employer] 2-4 liters at night).  NCM offered choice for HHPT/HHOT. He states he does not have a preference.  NCM made referral to [place] with [place]. She is able to take referral. Soc will begin 24 to 48 hrs post dc.      Expected Discharge Plan: [place] Barriers to Discharge: Continued Medical Work up  Expected Discharge Plan and Services Expected Discharge Plan: [place] In-house Referral: NA Discharge Planning Services: CM Consult Post Acute Care Choice: [place] arrangements for the past 2 months: Mobile Home                   DME Agency: NA       HH Arranged: PT, OT HH Agency: [place] Date [place]: 07/13/21 Time [place]: [id number] Representative spoke with at [place]: [place]   Social Determinants of Health ([place]) Interventions    Readmission Risk Interventions No flowsheet data found.

## 2021-07-13 NOTE — Progress Notes (Signed)
2D echo attempted, but patient in chair. Will try later when patient is in bed

## 2021-07-13 NOTE — Consult Note (Signed)
   [facility] Inpatient Consult   07/13/2021  AADIL SUR 08/21/1940 [id number]  [place] Organization [ACO] Patient: [place] Medicare  Primary Care Provider:  Jani Gravel, MD, [previously] with [facility]   Patient is currently active with [place] for chronic disease management services.  Patient has been engaged by a [place].  Our community based plan of care has focused on disease management and community resource support.     Plan: Will follow up with Inpatient Transition Of Care [TOC] team members to make aware that [place] [facility] following.   Of note, [facility] Care Management services does not replace or interfere with any services that are needed or arranged by inpatient [facility] care management team.  For additional questions or referrals please contact:  Natividad Brood, RN BSN [facility] Liaison  [phone] business mobile phone Toll free office [phone]  Fax number: [phone] [email or web address]

## 2021-07-13 NOTE — TOC Progression Note (Addendum)
Transition of Care [facility]) - Progression Note    Patient Details  Name: Cory Kemp MRN: [id number] Date of Birth: 11/29/1940  [facility] CM/SW Contact  Zenon Mayo, RN Phone Number: 07/13/2021, 9:25 AM  Clinical Narrative:    NCM spoke with patient at bedside, he lives with relatives and friends per patient, he states he still drives.  Per pt eval rec HHPT/HHOT.  He has rollaotr and oxygen at home ([employer] 2-4 liters at night).  NCM offered choice for HHPT/HHOT. He states he does not have a preference.  NCM made referral to [place] with [place]. She is able to take referral. Soc will begin 24 to 48 hrs post dc.         Expected Discharge Plan and Services                                                 Social Determinants of Health (SDOH) Interventions    Readmission Risk Interventions No flowsheet data found.

## 2021-07-14 LAB — CBC WITH DIFFERENTIAL/PLATELET
Abs Immature Granulocytes: 0.08 10*3/uL — ABNORMAL HIGH (ref 0.00–0.07)
Basophils Absolute: 0 10*3/uL (ref 0.0–0.1)
Basophils Relative: 0 %
Eosinophils Absolute: 0 10*3/uL (ref 0.0–0.5)
Eosinophils Relative: 0 %
HCT: 39.9 % (ref 39.0–52.0)
Hemoglobin: 12.6 g/dL — ABNORMAL LOW (ref 13.0–17.0)
Immature Granulocytes: 1 %
Lymphocytes Relative: 13 %
Lymphs Abs: 0.9 10*3/uL (ref 0.7–4.0)
MCH: 28.4 pg (ref 26.0–34.0)
MCHC: 31.6 g/dL (ref 30.0–36.0)
MCV: 89.9 fL (ref 80.0–100.0)
Monocytes Absolute: 0.7 10*3/uL (ref 0.1–1.0)
Monocytes Relative: 9 %
Neutro Abs: 5.3 10*3/uL (ref 1.7–7.7)
Neutrophils Relative %: 77 %
Platelets: 97 10*3/uL — ABNORMAL LOW (ref 150–400)
RBC: 4.44 MIL/uL (ref 4.22–5.81)
RDW: 15.2 % (ref 11.5–15.5)
WBC: 7 10*3/uL (ref 4.0–10.5)
nRBC: 0 % (ref 0.0–0.2)

## 2021-07-14 LAB — COMPREHENSIVE METABOLIC PANEL
ALT: 17 U/L (ref 0–44)
AST: 30 U/L (ref 15–41)
Albumin: 3.1 g/dL — ABNORMAL LOW (ref 3.5–5.0)
Alkaline Phosphatase: 50 U/L (ref 38–126)
Anion gap: 8 (ref 5–15)
BUN: 31 mg/dL — ABNORMAL HIGH (ref 8–23)
CO2: 26 mmol/L (ref 22–32)
Calcium: 8.4 mg/dL — ABNORMAL LOW (ref 8.9–10.3)
Chloride: 102 mmol/L (ref 98–111)
Creatinine, Ser: 0.97 mg/dL (ref 0.61–1.24)
GFR, Estimated: >60 mL/min (ref >60)
Glucose, Bld: 162 mg/dL — ABNORMAL HIGH (ref 70–99)
Potassium: 5 mmol/L (ref 3.5–5.1)
Sodium: 136 mmol/L (ref 135–145)
Total Bilirubin: 0.5 mg/dL (ref 0.3–1.2)
Total Protein: 5.8 g/dL — ABNORMAL LOW (ref 6.5–8.1)

## 2021-07-14 LAB — TSH: TSH: 0.177 u[IU]/mL — ABNORMAL LOW (ref 0.350–4.500)

## 2021-07-14 LAB — C-REACTIVE PROTEIN: CRP: 3.8 mg/dL — ABNORMAL HIGH (ref <1.0)

## 2021-07-14 LAB — T3: T3, Total: 80 ng/dL (ref 71–180)

## 2021-07-14 LAB — BRAIN NATRIURETIC PEPTIDE: B Natriuretic Peptide: 164.9 pg/mL — ABNORMAL HIGH (ref 0.0–100.0)

## 2021-07-14 LAB — MAGNESIUM: Magnesium: 2.3 mg/dL (ref 1.7–2.4)

## 2021-07-14 MED ORDER — SODIUM ZIRCONIUM CYCLOSILICATE 10 G PO PACK
10.0000 g | PACK | Freq: Two times a day (BID) | ORAL | Status: AC
  Administered 2021-07-14 (×2): 10 g via ORAL
  Filled 2021-07-14 (×2): qty 1

## 2021-07-14 MED ORDER — AMLODIPINE BESYLATE 10 MG PO TABS
10.0000 mg | ORAL_TABLET | Freq: Every day | ORAL | Status: DC
  Administered 2021-07-14 – 2021-07-16 (×3): 10 mg via ORAL
  Filled 2021-07-14 (×3): qty 1

## 2021-07-14 NOTE — Plan of Care (Signed)
  Problem: Education: Goal: Knowledge of disease or condition will improve Outcome: Progressing Goal: Knowledge of the prescribed therapeutic regimen will improve Outcome: Progressing   

## 2021-07-14 NOTE — Progress Notes (Signed)
PROGRESS NOTE                                                                                                                                                                                                             Patient Demographics:    Cory Kemp, is a 80 y.o. male, DOB - [birth year], XJO:[id number]  Outpatient Primary MD for the patient is Jani Gravel, MD    LOS - 2  Admit date - 07/11/2021    Chief Complaint  Patient presents with   Shortness of Breath       Brief Narrative (HPI from H&P)    Cory Kemp is a 80 y.o. male with medical history significant of COPD, diabetes, hypertension, anemia, OSA, sinus tachycardia, alcohol use, BPH, dementia, hyperlipidemia who presents ongoing shortness of breath he was diagnosed with acute hypoxic respiratory failure due to COPD exacerbation and admitted to the hospital on nonrebreather mask.   Subjective:   Patient in bed, appears comfortable, denies any headache, no fever, no chest pain or pressure, much improved shortness of breath , no abdominal pain. No new focal weakness.'    Assessment  & Plan :     Acute on chronic hypoxic respiratory failure due to COPD exacerbation caused by metapneumovirus infection - he has been placed on empiric IV steroids, nebulizer treatments along with oxygen supplementation, at home he uses 2 L nasal cannula oxygen on a as needed basis initially was on nonrebreather mask currently on 6 L nasal cannula oxygen, stable leukocyte count and procalcitonin levels hence I do not think he has pneumonia, will continue azithromycin for superimposed bacterial bronchitis.  Clinically much improved.  Likely discharge in the next 1 to 2 days.  Encouraged the patient to sit up in chair in the daytime use I-S and flutter valve for pulmonary toiletry.  Will advance activity and titrate down oxygen as possible.  2.  SVT at the time of admission.  Resolved,  currently on oral Cardizem, TSH as in #5 below, check echocardiogram  3.  Dyslipidemia.  Continue statin.  4.  Hypertension.  Has been now placed on combination of Norvasc, beta-blocker and as needed hydralazine.  5.  Hypothyroidism with low TSH and high free T4, placed on low-dose methimazole.  Outpatient endocrine follow-up.      Condition - Extremely Guarded  Family  Communication  Cory Kemp [phone] on 07/12/21  Code Status : Full  Consults  : None  PUD Prophylaxis : PPI   Procedures  :     Echocardiogram.   1. Left ventricular ejection fraction, by estimation, is 55 to 60%. Left ventricular ejection fraction by 2D MOD biplane is 58.6 %. The left ventricle has normal function. Left ventricular endocardial border not optimally defined to evaluate regional wall motion. There is moderate left ventricular hypertrophy. Left ventricular diastolic parameters are indeterminate.  2. Right ventricular systolic function was not well visualized. The right ventricular size is normal. Tricuspid regurgitation signal is inadequate for assessing PA pressure.  3. A small pericardial effusion is present.  4. The mitral valve was not well visualized. No evidence of mitral valve regurgitation.  5. The aortic valve was not well visualized. Aortic valve regurgitation is not visualized. Conclusion(s)/Recommendation(s): Technically difficult study; consider repeating when patient's clinical syndrome has improved.  CT - 1. Bilateral lower lobe pulmonary scarring/bronchitic changes with superimposed developing infection/inflammation. 2. Emphysema (ICD10-J43.9) - severe with large bulla measuring up to 13 cm in the right lower lobe. 3. Right hilar lymphadenopathy.  Attention on follow-up. 4. Enlarged main pulmonary artery suggestive of pulmonary hypertension. 5.  Aortic Atherosclerosis      Disposition Plan  :    Status is: Observation   DVT Prophylaxis  :    enoxaparin (LOVENOX) injection 40 mg  Start: 07/11/21 2115  Lab Results  Component Value Date   PLT 97 (L) 07/14/2021    Diet :  Diet Order             Diet Heart Room service appropriate? Yes; Fluid consistency: Thin  Diet effective now                    Inpatient Medications  Scheduled Meds:  amLODipine  10 mg Oral Daily   carvedilol  6.25 mg Oral BID WC   enoxaparin (LOVENOX) injection  40 mg Subcutaneous Q24H   escitalopram  20 mg Oral Daily   fluticasone furoate-vilanterol  1 puff Inhalation Daily   ipratropium-albuterol  3 mL Nebulization Q6H   losartan  25 mg Oral Daily   mouth rinse  15 mL Mouth Rinse BID   methimazole  5 mg Oral BID   methylPREDNISolone (SOLU-MEDROL) injection  60 mg Intravenous Q12H   montelukast  10 mg Oral Daily   pantoprazole  40 mg Oral Daily   simvastatin  40 mg Oral QPM   sodium chloride flush  3 mL Intravenous Q12H   sodium zirconium cyclosilicate  10 g Oral BID   umeclidinium bromide  1 puff Inhalation Daily   Continuous Infusions:  azithromycin Stopped (07/13/21 1930)   PRN Meds:.acetaminophen **OR** acetaminophen, albuterol, alum & mag hydroxide-simeth, cloNIDine, diltiazem, menthol-cetylpyridinium, phenol, traZODone  Antibiotics  :    Anti-infectives (From admission, onward)    Start     Dose/Rate Route Frequency Ordered Stop   07/12/21 1800  cefTRIAXone (ROCEPHIN) 2 g in sodium chloride 0.9 % 100 mL IVPB  Status:  Discontinued        2 g 200 mL/hr over 30 Minutes Intravenous Every 24 hours 07/11/21 2114 07/12/21 1313   07/12/21 1800  azithromycin (ZITHROMAX) 500 mg in sodium chloride 0.9 % 250 mL IVPB        500 mg 250 mL/hr over 60 Minutes Intravenous Every 24 hours 07/11/21 2114 07/17/21 1759   07/11/21 2045  cefTRIAXone (  ROCEPHIN) 1 g in sodium chloride 0.9 % 100 mL IVPB        1 g 200 mL/hr over 30 Minutes Intravenous  Once 07/11/21 2030 07/11/21 2140   07/11/21 2045  azithromycin (ZITHROMAX) 500 mg in sodium chloride 0.9 % 250 mL IVPB        500  mg 250 mL/hr over 60 Minutes Intravenous  Once 07/11/21 2030 07/11/21 2246        Time Spent in minutes  30   Lala Lund M.D on 07/14/2021 at 10:30 AM  To page go to [email or web address]   [facility] -  Office  [phone]  See all Orders from today for further details    Objective:   Vitals:   07/14/21 0500 07/14/21 0608 07/14/21 0728 07/14/21 0820  BP: (!) 173/93 (!) 157/82 (!) 157/93   Pulse: 86 72 81   Resp: 20     Temp: 99.3 F (37.4 C)  97.8 F (36.6 C)   TempSrc: Oral  Oral   SpO2: 96%  99% 91%  Weight: 89.1 kg     Height:        Wt Readings from Last 3 Encounters:  07/14/21 89.1 kg  05/31/21 88 kg  11/24/20 83.9 kg     Intake/Output Summary (Last 24 hours) at 07/14/2021 1030 Last data filed at 07/14/2021 0600 Gross per 24 hour  Intake 1480 ml  Output 1650 ml  Net -170 ml     Physical Exam  Awake Alert, No new F.N deficits, Normal affect [place].AT,PERRAL Supple Neck, No JVD,   Symmetrical Chest wall movement, Good air movement bilaterally, minimal wheezing RRR,No Gallops, Rubs or new Murmurs,  +ve B.Sounds, Abd Soft, No tenderness,   No Cyanosis, Clubbing or edema       Data Review:    CBC Recent Labs  Lab 07/11/21 1816 07/11/21 1818 07/11/21 1819 07/11/21 2300 07/12/21 0448 07/13/21 0054 07/14/21 0056  WBC 2.7*  --   --   --  6.4 6.4 7.0  HGB 13.7   < > 14.3 12.9* 12.3* 12.6* 12.6*  HCT 43.4   < > 42.0 38.0* 38.5* 38.6* 39.9  PLT 89*  --   --   --  83* 87* 97*  MCV 91.0  --   --   --  90.0 88.9 89.9  MCH 28.7  --   --   --  28.7 29.0 28.4  MCHC 31.6  --   --   --  31.9 32.6 31.6  RDW 15.0  --   --   --  15.1 15.1 15.2  LYMPHSABS 0.5*  --   --   --   --  0.6* 0.9  MONOABS 0.8  --   --   --   --  0.4 0.7  EOSABS 0.0  --   --   --   --  0.0 0.0  BASOSABS 0.0  --   --   --   --  0.0 0.0   < > = values in this interval not displayed.    Electrolytes Recent Labs  Lab 07/11/21 1816 07/11/21 1818 07/11/21 1819 07/11/21 2127  07/11/21 2300 07/12/21 0448 07/12/21 1434 07/13/21 0054 07/14/21 0056  NA 138 139 139  --  138 135  --  135 136  K 4.5 4.2 4.2  --  4.3 4.2  --  4.4 5.0  CL 102 103  --   --   --  105  --  102 102  CO2 25  --   --   --   --  24  --  25 26  GLUCOSE 190* 186*  --   --   --  176*  --  171* 162*  BUN 15 18  --   --   --  17  --  24* 31*  CREATININE 0.98 0.90  --   --   --  0.94  --  1.00 0.97  CALCIUM 8.6*  --   --   --   --  8.1*  --  8.3* 8.4*  AST 29  --   --   --   --  23  --  33 30  ALT 13  --   --   --   --  14  --  17 17  ALKPHOS 78  --   --   --   --  63  --  56 50  BILITOT 0.9  --   --   --   --  0.3  --  0.6 0.5  ALBUMIN 3.9  --   --   --   --  3.3*  --  3.3* 3.1*  MG  --   --   --   --   --   --  2.2 2.3 2.3  CRP  --   --   --   --   --   --  11.4* 10.3* 3.8*  PROCALCITON  --   --   --  0.23  --  0.67  --  0.60  --   LATICACIDVEN  --   --   --  1.3  --   --   --   --   --   TSH  --   --   --   --   --   --   --  0.165* 0.177*  BNP 138.0*  --   --   --   --   --  196.3* 122.1* 164.9*    ------------------------------------------------------------------------------------------------------------------ No results for input(s): CHOL, HDL, LDLCALC, TRIG, CHOLHDL, LDLDIRECT in the last 72 hours.  Lab Results  Component Value Date   HGBA1C 5.9 (H) 04/20/2020    Recent Labs    07/14/21 0056  TSH 0.177*   ------------------------------------------------------------------------------------------------------------------ ID Labs Recent Labs  Lab 07/11/21 1816 07/11/21 1818 07/11/21 2127 07/12/21 0448 07/12/21 1434 07/13/21 0054 07/14/21 0056  WBC 2.7*  --   --  6.4  --  6.4 7.0  PLT 89*  --   --  83*  --  87* 97*  CRP  --   --   --   --  11.4* 10.3* 3.8*  PROCALCITON  --   --  0.23 0.67  --  0.60  --   LATICACIDVEN  --   --  1.3  --   --   --   --   CREATININE 0.98 0.90  --  0.94  --  1.00 0.97   Cardiac Enzymes No results for input(s): CKMB, TROPONINI, MYOGLOBIN  in the last 168 hours.  Invalid input(s): CK    Radiology Reports CT Angio Chest PE W and/or Wo Contrast  Result Date: 07/11/2021 CLINICAL DATA:  Pt c/o increased SOB x 1 day. Pt had O2 sat in 60s in triage. PE suspected, high prob EXAM: CT ANGIOGRAPHY CHEST WITH CONTRAST TECHNIQUE: Multidetector CT imaging of the chest was performed using the standard protocol during bolus administration of intravenous contrast. Multiplanar CT image reconstructions  and MIPs were obtained to evaluate the vascular anatomy. CONTRAST:  29mL ISOVUE-370 IOPAMIDOL (ISOVUE-370) INJECTION 76% COMPARISON:  CT angio chest 02/11/2019 FINDINGS: Cardiovascular: Satisfactory opacification of the pulmonary arteries to the segmental level. Limited evaluation of the subsegmental level due to motion artifact. No evidence of pulmonary embolism. The main pulmonary artery is enlarged in caliber. Normal heart size. No significant pericardial effusion. The thoracic aorta is normal in caliber. Severe atherosclerotic plaque of the thoracic aorta. At least 3 vessel coronary artery calcifications. Aortic valve leaflet calcifications. Question aortic arch stent. Mediastinum/Nodes: Enlarged right hilar lymph node measuring up to 2.2 cm. No enlarged mediastinal, hilar, or axillary lymph nodes. Thyroid gland, trachea, and esophagus demonstrate no significant findings. Lungs/Pleura: Severe paraseptal and centrilobular emphysematous changes with a large right lower lobe basilar bulla measuring up to 13 cm. Bilateral lower lobe mild architectural distortion and traction bronchiectasis with superimposed bronchial wall thickening and mucous plugging. Upper Abdomen: Partially visualized fluid density lesion within the right kidney measuring up to at least 5.5 cm. No acute abnormality. Musculoskeletal: No chest wall abnormality. No suspicious lytic or blastic osseous lesions. No acute displaced fracture. Review of the MIP images confirms the above findings.  IMPRESSION: 1. Bilateral lower lobe pulmonary scarring/bronchitic changes with superimposed developing infection/inflammation. 2. Emphysema (ICD10-J43.9) - severe with large bulla measuring up to 13 cm in the right lower lobe. 3. Right hilar lymphadenopathy.  Attention on follow-up. 4. Enlarged main pulmonary artery suggestive of pulmonary hypertension. 5.  Aortic Atherosclerosis (ICD10-I70.0). Electronically Signed   By: Iven Finn M.D.   On: 07/11/2021 20:20   DG Chest Port 1 View  Result Date: 07/13/2021 CLINICAL DATA:  Shortness of breath EXAM: PORTABLE CHEST 1 VIEW COMPARISON:  Previous studies including the examination of 07/12/2021 FINDINGS: Transverse diameter of heart is increased. Thoracic aorta is tortuous and ectatic. Central pulmonary vessels are prominent. Increased interstitial markings are seen in the both parahilar regions and lower lung fields. No focal pulmonary consolidation is seen. There are no signs of alveolar pulmonary edema. Low position of diaphragms suggests COPD. Bullous emphysema is seen in the right lower lung fields. Small transverse linear densities in the left lower lung fields suggest subsegmental atelectasis. There is minimal blunting of lateral CP angles. There is no pneumothorax. IMPRESSION: COPD. Central pulmonary vessels are prominent without signs of alveolar pulmonary edema. Increased interstitial markings are seen in the parahilar regions and lower lung fields suggesting bronchitis or interstitial pneumonitis. No focal pulmonary consolidation is seen. Electronically Signed   By: Elmer Picker M.D.   On: 07/13/2021 08:07   DG Chest Port 1 View  Result Date: 07/12/2021 CLINICAL DATA:  Shortness of breath and cough EXAM: PORTABLE CHEST 1 VIEW COMPARISON:  07/11/2021 FINDINGS: Stable cardiomediastinal contours. No pleural effusion or edema. Chronic interstitial changes of COPD/emphysema noted. Mild bibasilar atelectasis appears unchanged. IMPRESSION: 1.  Stable bibasilar atelectasis. 2. COPD/emphysema. Electronically Signed   By: Kerby Moors M.D.   On: 07/12/2021 06:49   DG Chest Port 1 View  Result Date: 07/11/2021 CLINICAL DATA:  Shortness of breath, hypoxia history COPD. EXAM: PORTABLE CHEST 1 VIEW COMPARISON:  PA Lat 11/26/2020. FINDINGS: The cardiac size is normal. The aorta is tortuous and calcified but unchanged in configuration. Lungs are mildly emphysematous with chronic scar-like opacities in the lower lung fields, chronic slight blunting of the right lateral sulcus which could be a tiny chronic pleural effusion or pleurodiaphragmatic adhesion. The left sulci are sharp. The current study demonstrates increased streaky atelectasis  or infiltrate in the left infrahilar/retrocardiac area with partial obscuration of the adjacent diaphragm. No other new lung opacity is seen. Osteopenia and thoracic spondylosis. IMPRESSION: COPD and scarring changes, with increased retrocardiac/infrahilar left lower lobe opacity concerning for pneumonia or aspiration versus atelectasis. Clinical correlation and PA Lat radiographic follow-up recommended. Electronically Signed   By: Telford Nab M.D.   On: 07/11/2021 20:13   ECHOCARDIOGRAM COMPLETE  Result Date: 07/13/2021    ECHOCARDIOGRAM REPORT   Patient Name:   Cory Kemp Date of Exam: 07/13/2021 Medical Rec #:  [id number]     Height:       73.0 in Accession #:    [id number]    Weight:       199.7 lb Date of Birth:  07/21/1941     BSA:          2.150 m Patient Age:    31 years      BP:           170/87 mmHg Patient Gender: M             HR:           89 bpm. Exam Location:  Inpatient Procedure: 2D Echo, Cardiac Doppler, Color Doppler and Intracardiac            Opacification Agent Indications:    I42.9 Cardiomyopathy (unspecified)  History:        Patient has prior history of Echocardiogram examinations, most                 recent 04/20/2020. Abnormal ECG, COPD, Arrythmias:Tachycardia and                 SVT; Risk  Factors:Sleep Apnea, Dyslipidemia and Hypertension.                 ETOH. Hypoxia.  Sonographer:    Roseanna Rainbow RDCS Referring Phys: [id number] Margaree Mackintosh [facility]  Sonographer Comments: Technically difficult study due to poor echo windows, suboptimal parasternal window, suboptimal apical window and suboptimal subcostal window. Image acquisition challenging due to respiratory motion and Image acquisition challenging  due to COPD. Extremely difficult study. Patient in high fowler's position and audibly growled with every respiration throughout test. Nearly non- diagnostic exam IMPRESSIONS  1. Left ventricular ejection fraction, by estimation, is 55 to 60%. Left ventricular ejection fraction by 2D MOD biplane is 58.6 %. The left ventricle has normal function. Left ventricular endocardial border not optimally defined to evaluate regional wall motion. There is moderate left ventricular hypertrophy. Left ventricular diastolic parameters are indeterminate.  2. Right ventricular systolic function was not well visualized. The right ventricular size is normal. Tricuspid regurgitation signal is inadequate for assessing PA pressure.  3. A small pericardial effusion is present.  4. The mitral valve was not well visualized. No evidence of mitral valve regurgitation.  5. The aortic valve was not well visualized. Aortic valve regurgitation is not visualized. Conclusion(s)/Recommendation(s): Technically difficult study; consider repeating when patient's clinical syndrome has improved. FINDINGS  Left Ventricle: Left ventricular ejection fraction, by estimation, is 55 to 60%. Left ventricular ejection fraction by 2D MOD biplane is 58.6 %. The left ventricle has normal function. Left ventricular endocardial border not optimally defined to evaluate regional wall motion. Definity contrast agent was given IV to delineate the left ventricular endocardial borders. The left ventricular internal cavity size was normal in size. There is moderate left  ventricular hypertrophy. Left ventricular diastolic parameters are indeterminate. Right Ventricle: The right ventricular  size is normal. No increase in right ventricular wall thickness. Right ventricular systolic function was not well visualized. Tricuspid regurgitation signal is inadequate for assessing PA pressure. Left Atrium: Left atrial size was normal in size. Right Atrium: Right atrial size was normal in size. Pericardium: A small pericardial effusion is present. Mitral Valve: The mitral valve was not well visualized. No evidence of mitral valve regurgitation. Tricuspid Valve: The tricuspid valve is normal in structure. Tricuspid valve regurgitation is not demonstrated. Aortic Valve: The aortic valve was not well visualized. Aortic valve regurgitation is not visualized. Pulmonic Valve: The pulmonic valve was not well visualized. Pulmonic valve regurgitation is not visualized. No evidence of pulmonic stenosis. Aorta: The aortic root is normal in size and structure. IAS/Shunts: The interatrial septum appears to be lipomatous. The atrial septum is grossly normal.  LEFT VENTRICLE PLAX 2D                        Biplane EF (MOD) LVIDd:         5.25 cm         LV Biplane EF:   Left LVIDs:         4.10 cm                          ventricular LV PW:         1.30 cm                          ejection LV IVS:        1.65 cm                          fraction by LVOT diam:     2.20 cm                          2D MOD LV SV:         64                               biplane is LV SV Index:   30                               58.6 %. LVOT Area:     3.80 cm                                Diastology                                LV e' medial:    9.68 cm/s LV Volumes (MOD)               LV E/e' medial:  7.5 LV vol d, MOD    122.0 ml      LV e' lateral:   7.94 cm/s A2C:                           LV E/e' lateral: 9.2 LV vol d, MOD    126.0 ml A4C: LV vol s, MOD    43.3 ml A2C: LV  vol s, MOD    58.4 ml A4C: LV SV MOD A2C:   78.7 ml  LV SV MOD A4C:   126.0 ml LV SV MOD BP:    73.0 ml RIGHT VENTRICLE RV S prime:     13.80 cm/s TAPSE (M-mode): 2.6 cm LEFT ATRIUM             Index        RIGHT ATRIUM           Index LA diam:        4.30 cm 2.00 cm/m   RA Area:     16.90 cm LA Vol (A2C):   25.9 ml 12.05 ml/m  RA Volume:   46.40 ml  21.58 ml/m LA Vol (A4C):   28.8 ml 13.39 ml/m LA Biplane Vol: 30.2 ml 14.04 ml/m  AORTIC VALVE LVOT Vmax:   92.80 cm/s LVOT Vmean:  59.700 cm/s LVOT VTI:    0.169 m  AORTA Ao Root diam: 3.90 cm MITRAL VALVE MV Area (PHT): 4.50 cm     SHUNTS MV Decel Time: 169 msec     Systemic VTI:  0.17 m MV E velocity: 73.03 cm/s   Systemic Diam: 2.20 cm MV A velocity: 102.47 cm/s MV E/A ratio:  0.71 Rudean Haskell MD Electronically signed by Rudean Haskell MD Signature Date/Time: 07/13/2021/4:27:29 PM    Final

## 2021-07-14 NOTE — Progress Notes (Signed)
IP called said the patient need be on contact not droplet. Will continue to monitor the patient.

## 2021-07-14 NOTE — Progress Notes (Signed)
Mobility Specialist Progress Note:   07/14/21 1022  Mobility  Activity Ambulated in hall  Level of Assistance Modified independent, requires aide device or extra time  Assistive Device Front wheel walker  Distance Ambulated (ft) 100 ft  Mobility Ambulated with assistance in hallway  Mobility Response Tolerated well  Mobility performed by Mobility specialist  Bed Position Chair  $Mobility charge 1 Mobility   Pt received in bed willing to participate in mobility. No complaints of pain. Pt returned to chair with call bell in reach and all needs met.   [facility] Public librarian Phone [phone] Secondary Phone [phone]

## 2021-07-15 LAB — COMPREHENSIVE METABOLIC PANEL
ALT: 24 U/L (ref 0–44)
AST: 27 U/L (ref 15–41)
Albumin: 3.1 g/dL — ABNORMAL LOW (ref 3.5–5.0)
Alkaline Phosphatase: 50 U/L (ref 38–126)
Anion gap: 6 (ref 5–15)
BUN: 30 mg/dL — ABNORMAL HIGH (ref 8–23)
CO2: 30 mmol/L (ref 22–32)
Calcium: 8.8 mg/dL — ABNORMAL LOW (ref 8.9–10.3)
Chloride: 100 mmol/L (ref 98–111)
Creatinine, Ser: 1.14 mg/dL (ref 0.61–1.24)
GFR, Estimated: >60 mL/min (ref >60)
Glucose, Bld: 158 mg/dL — ABNORMAL HIGH (ref 70–99)
Potassium: 4.5 mmol/L (ref 3.5–5.1)
Sodium: 136 mmol/L (ref 135–145)
Total Bilirubin: 0.6 mg/dL (ref 0.3–1.2)
Total Protein: 6.1 g/dL — ABNORMAL LOW (ref 6.5–8.1)

## 2021-07-15 LAB — CBC WITH DIFFERENTIAL/PLATELET
Abs Immature Granulocytes: 0.1 10*3/uL — ABNORMAL HIGH (ref 0.00–0.07)
Band Neutrophils: 3 %
Basophils Absolute: 0 10*3/uL (ref 0.0–0.1)
Basophils Relative: 0 %
Eosinophils Absolute: 0 10*3/uL (ref 0.0–0.5)
Eosinophils Relative: 0 %
HCT: 44.3 % (ref 39.0–52.0)
Hemoglobin: 14.2 g/dL (ref 13.0–17.0)
Lymphocytes Relative: 11 %
Lymphs Abs: 0.6 10*3/uL — ABNORMAL LOW (ref 0.7–4.0)
MCH: 28.3 pg (ref 26.0–34.0)
MCHC: 32.1 g/dL (ref 30.0–36.0)
MCV: 88.2 fL (ref 80.0–100.0)
Metamyelocytes Relative: 1 %
Monocytes Absolute: 0.4 10*3/uL (ref 0.1–1.0)
Monocytes Relative: 6 %
Myelocytes: 1 %
Neutro Abs: 4.8 10*3/uL (ref 1.7–7.7)
Neutrophils Relative %: 78 %
Platelets: 112 10*3/uL — ABNORMAL LOW (ref 150–400)
RBC: 5.02 MIL/uL (ref 4.22–5.81)
RDW: 14.7 % (ref 11.5–15.5)
WBC: 5.9 10*3/uL (ref 4.0–10.5)
nRBC: 0 % (ref 0.0–0.2)
nRBC: 0 /100 WBC

## 2021-07-15 LAB — MAGNESIUM: Magnesium: 2.2 mg/dL (ref 1.7–2.4)

## 2021-07-15 LAB — C-REACTIVE PROTEIN: CRP: 1.4 mg/dL — ABNORMAL HIGH (ref <1.0)

## 2021-07-15 LAB — BRAIN NATRIURETIC PEPTIDE: B Natriuretic Peptide: 245.6 pg/mL — ABNORMAL HIGH (ref 0.0–100.0)

## 2021-07-15 MED ORDER — CARVEDILOL 12.5 MG PO TABS
12.5000 mg | ORAL_TABLET | Freq: Two times a day (BID) | ORAL | Status: DC
  Administered 2021-07-15 – 2021-07-16 (×3): 12.5 mg via ORAL
  Filled 2021-07-15 (×3): qty 1

## 2021-07-15 MED ORDER — PREDNISONE 20 MG PO TABS
30.0000 mg | ORAL_TABLET | Freq: Every day | ORAL | Status: DC
  Administered 2021-07-16: 30 mg via ORAL
  Filled 2021-07-15: qty 1

## 2021-07-15 MED ORDER — IPRATROPIUM-ALBUTEROL 0.5-2.5 (3) MG/3ML IN SOLN
3.0000 mL | Freq: Three times a day (TID) | RESPIRATORY_TRACT | Status: DC
  Administered 2021-07-15 – 2021-07-16 (×4): 3 mL via RESPIRATORY_TRACT
  Filled 2021-07-15 (×5): qty 3

## 2021-07-15 MED ORDER — CLONIDINE HCL 0.1 MG PO TABS
0.1000 mg | ORAL_TABLET | Freq: Three times a day (TID) | ORAL | Status: DC
  Administered 2021-07-15 – 2021-07-16 (×3): 0.1 mg via ORAL
  Filled 2021-07-15 (×3): qty 1

## 2021-07-15 NOTE — Progress Notes (Signed)
Mobility Specialist Progress Note:   07/15/21 1200  Mobility  Activity Ambulated in room  Level of Assistance Standby assist, set-up cues, supervision of patient - no hands on  Assistive Device Front wheel walker  Distance Ambulated (ft) 40 ft  Mobility Ambulated with assistance in room  Mobility Response Tolerated well  Mobility performed by Mobility specialist  Bed Position Chair  $Mobility charge 1 Mobility   Pt received in bed willing to participate in mobility. Ambulated to bathroom door with RW, able to have BM. Pt able to ambulate from bathroom to chair without AD. Pt left in chair with call bell in reach and all needs met.   [facility] Public librarian Phone [phone] Secondary Phone [phone]

## 2021-07-15 NOTE — Progress Notes (Signed)
Physical Therapy Treatment Patient Details Name: Cory Kemp MRN: [id number] DOB: May 25, 1941 Today's Date: 07/15/2021   History of Present Illness Pt is an 80 year old man admitted on 07/11/21 with SOB and hypoxia with Sp02 in the 60s. PMH: COPD, DM, HTN, anemia, OSA, sinus tachycardia, HLD, alcohol abuse, BPH.    PT Comments    Pt making great progress with mobility, ambulating up to ~270 ft with supervision without UE support, LOB, or assistance today. However, he does continue to display DOE, requiring a seated rest break to recover. Pt with good knowledge in regards to breathing and energy conservation techniques. Pt would benefit from follow-up HHPT to maximize his independence and activity tolerance as he reports to be very active at baseline. Will continue to follow acutely.    Recommendations for follow up therapy are one component of a multi-disciplinary discharge planning process, led by the attending physician.  Recommendations may be updated based on patient status, additional functional criteria and insurance authorization.  Follow Up Recommendations  Home health PT     Assistance Recommended at Discharge Intermittent Supervision/Assistance  Equipment Recommendations  None recommended by PT    Recommendations for Other Services       Precautions / Restrictions Precautions Precautions: Fall Precaution Comments: watch 02 Restrictions Weight Bearing Restrictions: No     Mobility  Bed Mobility Overal bed mobility: Modified Independent             General bed mobility comments: Pt able to transition supine <> sit EOB with HOB elevated without assistance.    Transfers Overall transfer level: Needs assistance Equipment used: Rolling walker (2 wheels) Transfers: Sit to/from Stand Sit to Stand: Supervision           General transfer comment: Comes to stand in appropriate time frame without LOB, supervision for safety to cue pt to not pull up on RA.     Ambulation/Gait Ambulation/Gait assistance: Supervision Gait Distance (Feet): 270 Feet Assistive device: Rolling walker (2 wheels);None Gait Pattern/deviations: Step-through pattern;Decreased stride length Gait velocity: Decreased Gait velocity interpretation: >2.62 ft/sec, indicative of community ambulatory   General Gait Details: Initially using RW for stability, but after a few steps removed RW with pt able to ambulate hallway and within room safely without LOB, supervision for safety. Pt able to turn, change head positions, and change speeds minimally without LOB. DOE 2/4, SpO2 94% on O2 via [place] sitting after ambulating, good knowledge of breathing techniques and energy conservation techniques   Stairs             Wheelchair Mobility    Modified Rankin (Stroke Patients Only)       Balance Overall balance assessment: Needs assistance Sitting-balance support: No upper extremity supported Sitting balance-Leahy Scale: Good     Standing balance support: No upper extremity supported Standing balance-Leahy Scale: Good Standing balance comment: Able to reach off BOS and ambulate without UE support safely.                            Cognition Arousal/Alertness: Awake/alert Behavior During Therapy: WFL for tasks assessed/performed Overall Cognitive Status: Within Functional Limits for tasks assessed                                 General Comments: Pt following commands and maintaining safety well this session.        Exercises  General Comments        Pertinent Vitals/Pain Pain Assessment: Faces Faces Pain Scale: No hurt Pain Intervention(s): Monitored during session    Home Living                          Prior Function            PT Goals (current goals can now be found in the care plan section) Acute Rehab PT Goals Patient Stated Goal: to go home soon PT Goal Formulation: With patient Time For Goal  Achievement: 07/26/21 Potential to Achieve Goals: Good Progress towards PT goals: Progressing toward goals    Frequency    Min 3X/week      PT Plan Current plan remains appropriate    Co-evaluation              AM-PAC PT "6 Clicks" Mobility   Outcome Measure  Help needed turning from your back to your side while in a flat bed without using bedrails?: None Help needed moving from lying on your back to sitting on the side of a flat bed without using bedrails?: None Help needed moving to and from a bed to a chair (including a wheelchair)?: A Little Help needed standing up from a chair using your arms (e.g., wheelchair or bedside chair)?: A Little Help needed to walk in hospital room?: A Little Help needed climbing 3-5 steps with a railing? : A Little 6 Click Score: 20    End of Session Equipment Utilized During Treatment: Oxygen Activity Tolerance: Patient tolerated treatment well Patient left: in bed;with call bell/phone within reach   PT Visit Diagnosis: Unsteadiness on feet (R26.81);Muscle weakness (generalized) (M62.81)     Time: 6063-0160 PT Time Calculation (min) (ACUTE ONLY): 23 min  Charges:  $Gait Training: 8-22 mins $Therapeutic Activity: 8-22 mins                     Moishe Spice, PT, DPT Acute Rehabilitation Services  Pager: [phone] Office: [place] 07/15/2021, 5:15 PM

## 2021-07-15 NOTE — Progress Notes (Signed)
PROGRESS NOTE                                                                                                                                                                                                             Patient Demographics:    Cory Kemp, is a 80 y.o. male, DOB - 03/19/1941, FUX:[id number]  Outpatient Primary MD for the patient is Jani Gravel, MD    LOS - 3  Admit date - 07/11/2021    Chief Complaint  Patient presents with   Shortness of Breath       Brief Narrative (HPI from H&P)    Cory Kemp is a 80 y.o. male with medical history significant of COPD, diabetes, hypertension, anemia, OSA, sinus tachycardia, alcohol use, BPH, dementia, hyperlipidemia who presents ongoing shortness of breath he was diagnosed with acute hypoxic respiratory failure due to COPD exacerbation and admitted to the hospital on nonrebreather mask.   Subjective:   Patient in bed, appears comfortable, denies any headache, no fever, no chest pain or pressure, no shortness of breath , no abdominal pain. No new focal weakness.    Assessment  & Plan :     Acute on chronic hypoxic respiratory failure due to COPD exacerbation caused by metapneumovirus infection - he has been placed on empiric IV steroids, nebulizer treatments along with oxygen supplementation, at home he uses 2 L nasal cannula oxygen on a as needed basis initially was on nonrebreather mask currently on 6 L nasal cannula oxygen, stable leukocyte count and procalcitonin levels hence I do not think he has pneumonia, will continue azithromycin for superimposed bacterial bronchitis.  Clinically much improved - taper steroids.  Likely discharge in the next 1 to 2 days.  Encouraged the patient to sit up in chair in the daytime use I-S and flutter valve for pulmonary toiletry.  Will advance activity and titrate down oxygen as possible.  2.  SVT at the time of admission.  Resolved,  currently on oral Cardizem, TSH as in #5 below, check echocardiogram  3.  Dyslipidemia.  Continue statin.  4.  Hypertension.  Has been now placed on combination of Norvasc, beta-blocker , BP still high added Catapres , continue as needed hydralazine.  5.  Hypothyroidism with low TSH and high free T4, placed on low-dose methimazole.  Outpatient endocrine follow-up.  Condition - Extremely Guarded  Family Communication  :  Kamaal Cast [phone] on 07/12/21  Code Status : Full  Consults  : None  PUD Prophylaxis : PPI   Procedures  :     Echocardiogram.   1. Left ventricular ejection fraction, by estimation, is 55 to 60%. Left ventricular ejection fraction by 2D MOD biplane is 58.6 %. The left ventricle has normal function. Left ventricular endocardial border not optimally defined to evaluate regional wall motion. There is moderate left ventricular hypertrophy. Left ventricular diastolic parameters are indeterminate.  2. Right ventricular systolic function was not well visualized. The right ventricular size is normal. Tricuspid regurgitation signal is inadequate for assessing PA pressure.  3. A small pericardial effusion is present.  4. The mitral valve was not well visualized. No evidence of mitral valve regurgitation.  5. The aortic valve was not well visualized. Aortic valve regurgitation is not visualized. Conclusion(s)/Recommendation(s): Technically difficult study; consider repeating when patient's clinical syndrome has improved.  CT - 1. Bilateral lower lobe pulmonary scarring/bronchitic changes with superimposed developing infection/inflammation. 2. Emphysema (ICD10-J43.9) - severe with large bulla measuring up to 13 cm in the right lower lobe. 3. Right hilar lymphadenopathy.  Attention on follow-up. 4. Enlarged main pulmonary artery suggestive of pulmonary hypertension. 5.  Aortic Atherosclerosis      Disposition Plan  :    Status is: Observation   DVT Prophylaxis  :     enoxaparin (LOVENOX) injection 40 mg Start: 07/11/21 2115  Lab Results  Component Value Date   PLT 112 (L) 07/15/2021    Diet :  Diet Order             Diet Heart Room service appropriate? Yes; Fluid consistency: Thin  Diet effective now                    Inpatient Medications  Scheduled Meds:  amLODipine  10 mg Oral Daily   carvedilol  12.5 mg Oral BID WC   enoxaparin (LOVENOX) injection  40 mg Subcutaneous Q24H   escitalopram  20 mg Oral Daily   fluticasone furoate-vilanterol  1 puff Inhalation Daily   ipratropium-albuterol  3 mL Nebulization TID   losartan  25 mg Oral Daily   mouth rinse  15 mL Mouth Rinse BID   methimazole  5 mg Oral BID   methylPREDNISolone (SOLU-MEDROL) injection  60 mg Intravenous Q12H   montelukast  10 mg Oral Daily   pantoprazole  40 mg Oral Daily   simvastatin  40 mg Oral QPM   sodium chloride flush  3 mL Intravenous Q12H   umeclidinium bromide  1 puff Inhalation Daily   Continuous Infusions:  azithromycin Stopped (07/14/21 1800)   PRN Meds:.acetaminophen **OR** acetaminophen, albuterol, alum & mag hydroxide-simeth, cloNIDine, diltiazem, menthol-cetylpyridinium, phenol, traZODone  Antibiotics  :    Anti-infectives (From admission, onward)    Start     Dose/Rate Route Frequency Ordered Stop   07/12/21 1800  cefTRIAXone (ROCEPHIN) 2 g in sodium chloride 0.9 % 100 mL IVPB  Status:  Discontinued        2 g 200 mL/hr over 30 Minutes Intravenous Every 24 hours 07/11/21 2114 07/12/21 1313   07/12/21 1800  azithromycin (ZITHROMAX) 500 mg in sodium chloride 0.9 % 250 mL IVPB        500 mg 250 mL/hr over 60 Minutes Intravenous Every 24 hours 07/11/21 2114 07/15/21 2359   07/11/21 2045  cefTRIAXone (ROCEPHIN) 1 g in  sodium chloride 0.9 % 100 mL IVPB        1 g 200 mL/hr over 30 Minutes Intravenous  Once 07/11/21 2030 07/11/21 2140   07/11/21 2045  azithromycin (ZITHROMAX) 500 mg in sodium chloride 0.9 % 250 mL IVPB        500 mg 250  mL/hr over 60 Minutes Intravenous  Once 07/11/21 2030 07/11/21 2246        Time Spent in minutes  30   Lala Lund M.D on 07/15/2021 at 3:10 PM  To page go to [email or web address]   [facility] -  Office  [phone]  See all Orders from today for further details    Objective:   Vitals:   07/15/21 0625 07/15/21 0700 07/15/21 0936 07/15/21 1048  BP: (!) 190/88  (!) 173/80 (!) 189/75  Pulse: 71  74 83  Resp: 20  18 20   Temp: 99 F (37.2 C)   98.9 F (37.2 C)  TempSrc: Oral   Oral  SpO2: 100% 95% 95% 94%  Weight: 89 kg     Height:        Wt Readings from Last 3 Encounters:  07/15/21 89 kg  05/31/21 88 kg  11/24/20 83.9 kg     Intake/Output Summary (Last 24 hours) at 07/15/2021 1510 Last data filed at 07/15/2021 1331 Gross per 24 hour  Intake 1460.28 ml  Output 2275 ml  Net -814.72 ml     Physical Exam  Awake Alert, No new F.N deficits, Normal affect [place].AT,PERRAL Supple Neck, No JVD,   Symmetrical Chest wall movement, Good air movement bilaterally, mild wheezing RRR,No Gallops, Rubs or new Murmurs,  +ve B.Sounds, Abd Soft, No tenderness,   No Cyanosis, Clubbing or edema         Data Review:    CBC Recent Labs  Lab 07/11/21 1816 07/11/21 1818 07/11/21 2300 07/12/21 0448 07/13/21 0054 07/14/21 0056 07/15/21 0246  WBC 2.7*  --   --  6.4 6.4 7.0 5.9  HGB 13.7   < > 12.9* 12.3* 12.6* 12.6* 14.2  HCT 43.4   < > 38.0* 38.5* 38.6* 39.9 44.3  PLT 89*  --   --  83* 87* 97* 112*  MCV 91.0  --   --  90.0 88.9 89.9 88.2  MCH 28.7  --   --  28.7 29.0 28.4 28.3  MCHC 31.6  --   --  31.9 32.6 31.6 32.1  RDW 15.0  --   --  15.1 15.1 15.2 14.7  LYMPHSABS 0.5*  --   --   --  0.6* 0.9 0.6*  MONOABS 0.8  --   --   --  0.4 0.7 0.4  EOSABS 0.0  --   --   --  0.0 0.0 0.0  BASOSABS 0.0  --   --   --  0.0 0.0 0.0   < > = values in this interval not displayed.    Electrolytes Recent Labs  Lab 07/11/21 1816 07/11/21 1818 07/11/21 1819 07/11/21 2127  07/11/21 2300 07/12/21 0448 07/12/21 1434 07/13/21 0054 07/14/21 0056 07/15/21 0246  NA 138 139   < >  --  138 135  --  135 136 136  K 4.5 4.2   < >  --  4.3 4.2  --  4.4 5.0 4.5  CL 102 103  --   --   --  105  --  102 102 100  CO2 25  --   --   --   --  24  --  25 26 30   GLUCOSE 190* 186*  --   --   --  176*  --  171* 162* 158*  BUN 15 18  --   --   --  17  --  24* 31* 30*  CREATININE 0.98 0.90  --   --   --  0.94  --  1.00 0.97 1.14  CALCIUM 8.6*  --   --   --   --  8.1*  --  8.3* 8.4* 8.8*  AST 29  --   --   --   --  23  --  33 30 27  ALT 13  --   --   --   --  14  --  17 17 24   ALKPHOS 78  --   --   --   --  63  --  56 50 50  BILITOT 0.9  --   --   --   --  0.3  --  0.6 0.5 0.6  ALBUMIN 3.9  --   --   --   --  3.3*  --  3.3* 3.1* 3.1*  MG  --   --   --   --   --   --  2.2 2.3 2.3 2.2  CRP  --   --   --   --   --   --  11.4* 10.3* 3.8* 1.4*  PROCALCITON  --   --   --  0.23  --  0.67  --  0.60  --   --   LATICACIDVEN  --   --   --  1.3  --   --   --   --   --   --   TSH  --   --   --   --   --   --   --  0.165* 0.177*  --   BNP 138.0*  --   --   --   --   --  196.3* 122.1* 164.9* 245.6*   < > = values in this interval not displayed.    ------------------------------------------------------------------------------------------------------------------ No results for input(s): CHOL, HDL, LDLCALC, TRIG, CHOLHDL, LDLDIRECT in the last 72 hours.  Lab Results  Component Value Date   HGBA1C 5.9 (H) 04/20/2020    Recent Labs    07/14/21 0056  TSH 0.177*   ------------------------------------------------------------------------------------------------------------------ ID Labs Recent Labs  Lab 07/11/21 1816 07/11/21 1818 07/11/21 2127 07/12/21 0448 07/12/21 1434 07/13/21 0054 07/14/21 0056 07/15/21 0246  WBC 2.7*  --   --  6.4  --  6.4 7.0 5.9  PLT 89*  --   --  83*  --  87* 97* 112*  CRP  --   --   --   --  11.4* 10.3* 3.8* 1.4*  PROCALCITON  --   --  0.23 0.67  --   0.60  --   --   LATICACIDVEN  --   --  1.3  --   --   --   --   --   CREATININE 0.98 0.90  --  0.94  --  1.00 0.97 1.14   Cardiac Enzymes No results for input(s): CKMB, TROPONINI, MYOGLOBIN in the last 168 hours.  Invalid input(s): CK    Radiology Reports CT Angio Chest PE W and/or Wo Contrast  Result Date: 07/11/2021 CLINICAL DATA:  Pt c/o increased SOB x 1 day. Pt had O2 sat in 60s in triage. PE suspected,  high prob EXAM: CT ANGIOGRAPHY CHEST WITH CONTRAST TECHNIQUE: Multidetector CT imaging of the chest was performed using the standard protocol during bolus administration of intravenous contrast. Multiplanar CT image reconstructions and MIPs were obtained to evaluate the vascular anatomy. CONTRAST:  86mL ISOVUE-370 IOPAMIDOL (ISOVUE-370) INJECTION 76% COMPARISON:  CT angio chest 02/11/2019 FINDINGS: Cardiovascular: Satisfactory opacification of the pulmonary arteries to the segmental level. Limited evaluation of the subsegmental level due to motion artifact. No evidence of pulmonary embolism. The main pulmonary artery is enlarged in caliber. Normal heart size. No significant pericardial effusion. The thoracic aorta is normal in caliber. Severe atherosclerotic plaque of the thoracic aorta. At least 3 vessel coronary artery calcifications. Aortic valve leaflet calcifications. Question aortic arch stent. Mediastinum/Nodes: Enlarged right hilar lymph node measuring up to 2.2 cm. No enlarged mediastinal, hilar, or axillary lymph nodes. Thyroid gland, trachea, and esophagus demonstrate no significant findings. Lungs/Pleura: Severe paraseptal and centrilobular emphysematous changes with a large right lower lobe basilar bulla measuring up to 13 cm. Bilateral lower lobe mild architectural distortion and traction bronchiectasis with superimposed bronchial wall thickening and mucous plugging. Upper Abdomen: Partially visualized fluid density lesion within the right kidney measuring up to at least 5.5 cm. No  acute abnormality. Musculoskeletal: No chest wall abnormality. No suspicious lytic or blastic osseous lesions. No acute displaced fracture. Review of the MIP images confirms the above findings. IMPRESSION: 1. Bilateral lower lobe pulmonary scarring/bronchitic changes with superimposed developing infection/inflammation. 2. Emphysema (ICD10-J43.9) - severe with large bulla measuring up to 13 cm in the right lower lobe. 3. Right hilar lymphadenopathy.  Attention on follow-up. 4. Enlarged main pulmonary artery suggestive of pulmonary hypertension. 5.  Aortic Atherosclerosis (ICD10-I70.0). Electronically Signed   By: Iven Finn M.D.   On: 07/11/2021 20:20   DG Chest Port 1 View  Result Date: 07/13/2021 CLINICAL DATA:  Shortness of breath EXAM: PORTABLE CHEST 1 VIEW COMPARISON:  Previous studies including the examination of 07/12/2021 FINDINGS: Transverse diameter of heart is increased. Thoracic aorta is tortuous and ectatic. Central pulmonary vessels are prominent. Increased interstitial markings are seen in the both parahilar regions and lower lung fields. No focal pulmonary consolidation is seen. There are no signs of alveolar pulmonary edema. Low position of diaphragms suggests COPD. Bullous emphysema is seen in the right lower lung fields. Small transverse linear densities in the left lower lung fields suggest subsegmental atelectasis. There is minimal blunting of lateral CP angles. There is no pneumothorax. IMPRESSION: COPD. Central pulmonary vessels are prominent without signs of alveolar pulmonary edema. Increased interstitial markings are seen in the parahilar regions and lower lung fields suggesting bronchitis or interstitial pneumonitis. No focal pulmonary consolidation is seen. Electronically Signed   By: Elmer Picker M.D.   On: 07/13/2021 08:07   DG Chest Port 1 View  Result Date: 07/12/2021 CLINICAL DATA:  Shortness of breath and cough EXAM: PORTABLE CHEST 1 VIEW COMPARISON:  07/11/2021  FINDINGS: Stable cardiomediastinal contours. No pleural effusion or edema. Chronic interstitial changes of COPD/emphysema noted. Mild bibasilar atelectasis appears unchanged. IMPRESSION: 1. Stable bibasilar atelectasis. 2. COPD/emphysema. Electronically Signed   By: Kerby Moors M.D.   On: 07/12/2021 06:49   DG Chest Port 1 View  Result Date: 07/11/2021 CLINICAL DATA:  Shortness of breath, hypoxia history COPD. EXAM: PORTABLE CHEST 1 VIEW COMPARISON:  PA Lat 11/26/2020. FINDINGS: The cardiac size is normal. The aorta is tortuous and calcified but unchanged in configuration. Lungs are mildly emphysematous with chronic scar-like opacities in the lower lung fields,  chronic slight blunting of the right lateral sulcus which could be a tiny chronic pleural effusion or pleurodiaphragmatic adhesion. The left sulci are sharp. The current study demonstrates increased streaky atelectasis or infiltrate in the left infrahilar/retrocardiac area with partial obscuration of the adjacent diaphragm. No other new lung opacity is seen. Osteopenia and thoracic spondylosis. IMPRESSION: COPD and scarring changes, with increased retrocardiac/infrahilar left lower lobe opacity concerning for pneumonia or aspiration versus atelectasis. Clinical correlation and PA Lat radiographic follow-up recommended. Electronically Signed   By: Telford Nab M.D.   On: 07/11/2021 20:13   ECHOCARDIOGRAM COMPLETE  Result Date: 07/13/2021    ECHOCARDIOGRAM REPORT   Patient Name:   KINAN SAFLEY [facility] Date of Exam: 07/13/2021 Medical Rec #:  [id number]     Height:       73.0 in Accession #:    [id number]    Weight:       199.7 lb Date of Birth:  [birth year]     BSA:          2.150 m Patient Age:    3 years      BP:           170/87 mmHg Patient Gender: M             HR:           89 bpm. Exam Location:  Inpatient Procedure: 2D Echo, Cardiac Doppler, Color Doppler and Intracardiac            Opacification Agent Indications:    I42.9 Cardiomyopathy  (unspecified)  History:        Patient has prior history of Echocardiogram examinations, most                 recent 04/20/2020. Abnormal ECG, COPD, Arrythmias:Tachycardia and                 SVT; Risk Factors:Sleep Apnea, Dyslipidemia and Hypertension.                 ETOH. Hypoxia.  Sonographer:    Roseanna Rainbow RDCS Referring Phys: [id number] Margaree Mackintosh [facility]  Sonographer Comments: Technically difficult study due to poor echo windows, suboptimal parasternal window, suboptimal apical window and suboptimal subcostal window. Image acquisition challenging due to respiratory motion and Image acquisition challenging  due to COPD. Extremely difficult study. Patient in high fowler's position and audibly growled with every respiration throughout test. Nearly non- diagnostic exam IMPRESSIONS  1. Left ventricular ejection fraction, by estimation, is 55 to 60%. Left ventricular ejection fraction by 2D MOD biplane is 58.6 %. The left ventricle has normal function. Left ventricular endocardial border not optimally defined to evaluate regional wall motion. There is moderate left ventricular hypertrophy. Left ventricular diastolic parameters are indeterminate.  2. Right ventricular systolic function was not well visualized. The right ventricular size is normal. Tricuspid regurgitation signal is inadequate for assessing PA pressure.  3. A small pericardial effusion is present.  4. The mitral valve was not well visualized. No evidence of mitral valve regurgitation.  5. The aortic valve was not well visualized. Aortic valve regurgitation is not visualized. Conclusion(s)/Recommendation(s): Technically difficult study; consider repeating when patient's clinical syndrome has improved. FINDINGS  Left Ventricle: Left ventricular ejection fraction, by estimation, is 55 to 60%. Left ventricular ejection fraction by 2D MOD biplane is 58.6 %. The left ventricle has normal function. Left ventricular endocardial border not optimally defined to evaluate  regional wall motion. Definity contrast agent was given IV to delineate the  left ventricular endocardial borders. The left ventricular internal cavity size was normal in size. There is moderate left ventricular hypertrophy. Left ventricular diastolic parameters are indeterminate. Right Ventricle: The right ventricular size is normal. No increase in right ventricular wall thickness. Right ventricular systolic function was not well visualized. Tricuspid regurgitation signal is inadequate for assessing PA pressure. Left Atrium: Left atrial size was normal in size. Right Atrium: Right atrial size was normal in size. Pericardium: A small pericardial effusion is present. Mitral Valve: The mitral valve was not well visualized. No evidence of mitral valve regurgitation. Tricuspid Valve: The tricuspid valve is normal in structure. Tricuspid valve regurgitation is not demonstrated. Aortic Valve: The aortic valve was not well visualized. Aortic valve regurgitation is not visualized. Pulmonic Valve: The pulmonic valve was not well visualized. Pulmonic valve regurgitation is not visualized. No evidence of pulmonic stenosis. Aorta: The aortic root is normal in size and structure. IAS/Shunts: The interatrial septum appears to be lipomatous. The atrial septum is grossly normal.  LEFT VENTRICLE PLAX 2D                        Biplane EF (MOD) LVIDd:         5.25 cm         LV Biplane EF:   Left LVIDs:         4.10 cm                          ventricular LV PW:         1.30 cm                          ejection LV IVS:        1.65 cm                          fraction by LVOT diam:     2.20 cm                          2D MOD LV SV:         64                               biplane is LV SV Index:   30                               58.6 %. LVOT Area:     3.80 cm                                Diastology                                LV e' medial:    9.68 cm/s LV Volumes (MOD)               LV E/e' medial:  7.5 LV vol d, MOD    122.0 ml       LV e' lateral:   7.94 cm/s A2C:  LV E/e' lateral: 9.2 LV vol d, MOD    126.0 ml A4C: LV vol s, MOD    43.3 ml A2C: LV vol s, MOD    58.4 ml A4C: LV SV MOD A2C:   78.7 ml LV SV MOD A4C:   126.0 ml LV SV MOD BP:    73.0 ml RIGHT VENTRICLE RV S prime:     13.80 cm/s TAPSE (M-mode): 2.6 cm LEFT ATRIUM             Index        RIGHT ATRIUM           Index LA diam:        4.30 cm 2.00 cm/m   RA Area:     16.90 cm LA Vol (A2C):   25.9 ml 12.05 ml/m  RA Volume:   46.40 ml  21.58 ml/m LA Vol (A4C):   28.8 ml 13.39 ml/m LA Biplane Vol: 30.2 ml 14.04 ml/m  AORTIC VALVE LVOT Vmax:   92.80 cm/s LVOT Vmean:  59.700 cm/s LVOT VTI:    0.169 m  AORTA Ao Root diam: 3.90 cm MITRAL VALVE MV Area (PHT): 4.50 cm     SHUNTS MV Decel Time: 169 msec     Systemic VTI:  0.17 m MV E velocity: 73.03 cm/s   Systemic Diam: 2.20 cm MV A velocity: 102.47 cm/s MV E/A ratio:  0.71 Rudean Haskell MD Electronically signed by Rudean Haskell MD Signature Date/Time: 07/13/2021/4:27:29 PM    Final

## 2021-07-16 ENCOUNTER — Other Ambulatory Visit: Payer: Self-pay | Admitting: *Deleted

## 2021-07-16 DIAGNOSIS — J9621 Acute and chronic respiratory failure with hypoxia: Secondary | ICD-10-CM | POA: Diagnosis not present

## 2021-07-16 LAB — CBC WITH DIFFERENTIAL/PLATELET
Abs Immature Granulocytes: 0.2 10*3/uL — ABNORMAL HIGH (ref 0.00–0.07)
Band Neutrophils: 5 %
Basophils Absolute: 0 10*3/uL (ref 0.0–0.1)
Basophils Relative: 0 %
Eosinophils Absolute: 0 10*3/uL (ref 0.0–0.5)
Eosinophils Relative: 0 %
HCT: 41.1 % (ref 39.0–52.0)
Hemoglobin: 13.4 g/dL (ref 13.0–17.0)
Lymphocytes Relative: 14 %
Lymphs Abs: 1 10*3/uL (ref 0.7–4.0)
MCH: 28.6 pg (ref 26.0–34.0)
MCHC: 32.6 g/dL (ref 30.0–36.0)
MCV: 87.6 fL (ref 80.0–100.0)
Metamyelocytes Relative: 3 %
Monocytes Absolute: 0.8 10*3/uL (ref 0.1–1.0)
Monocytes Relative: 11 %
Neutro Abs: 5 10*3/uL (ref 1.7–7.7)
Neutrophils Relative %: 67 %
Platelets: 102 10*3/uL — ABNORMAL LOW (ref 150–400)
RBC: 4.69 MIL/uL (ref 4.22–5.81)
RDW: 14.4 % (ref 11.5–15.5)
WBC: 7 10*3/uL (ref 4.0–10.5)
nRBC: 0 % (ref 0.0–0.2)
nRBC: 0 /100 WBC

## 2021-07-16 LAB — CULTURE, BLOOD (ROUTINE X 2)
Culture: NO GROWTH
Culture: NO GROWTH

## 2021-07-16 LAB — COMPREHENSIVE METABOLIC PANEL
ALT: 24 U/L (ref 0–44)
AST: 19 U/L (ref 15–41)
Albumin: 2.9 g/dL — ABNORMAL LOW (ref 3.5–5.0)
Alkaline Phosphatase: 43 U/L (ref 38–126)
Anion gap: 5 (ref 5–15)
BUN: 31 mg/dL — ABNORMAL HIGH (ref 8–23)
CO2: 31 mmol/L (ref 22–32)
Calcium: 8.8 mg/dL — ABNORMAL LOW (ref 8.9–10.3)
Chloride: 101 mmol/L (ref 98–111)
Creatinine, Ser: 0.8 mg/dL (ref 0.61–1.24)
GFR, Estimated: >60 mL/min (ref >60)
Glucose, Bld: 106 mg/dL — ABNORMAL HIGH (ref 70–99)
Potassium: 4.6 mmol/L (ref 3.5–5.1)
Sodium: 137 mmol/L (ref 135–145)
Total Bilirubin: 0.7 mg/dL (ref 0.3–1.2)
Total Protein: 5.5 g/dL — ABNORMAL LOW (ref 6.5–8.1)

## 2021-07-16 LAB — BRAIN NATRIURETIC PEPTIDE: B Natriuretic Peptide: 205.2 pg/mL — ABNORMAL HIGH (ref 0.0–100.0)

## 2021-07-16 LAB — MAGNESIUM: Magnesium: 1.9 mg/dL (ref 1.7–2.4)

## 2021-07-16 LAB — C-REACTIVE PROTEIN: CRP: 0.7 mg/dL (ref <1.0)

## 2021-07-16 MED ORDER — ISOSORBIDE MONONITRATE ER 30 MG PO TB24
30.0000 mg | ORAL_TABLET | Freq: Every day | ORAL | Status: DC
  Administered 2021-07-16: 30 mg via ORAL
  Filled 2021-07-16: qty 1

## 2021-07-16 MED ORDER — CARVEDILOL 12.5 MG PO TABS: 12.5000 mg | ORAL_TABLET | Freq: Two times a day (BID) | ORAL | 0 refills | Status: DC

## 2021-07-16 MED ORDER — ISOSORBIDE MONONITRATE ER 30 MG PO TB24: 30.0000 mg | ORAL_TABLET | Freq: Every day | ORAL | 0 refills | Status: DC

## 2021-07-16 MED ORDER — PREDNISONE 5 MG PO TABS: ORAL_TABLET | ORAL | 0 refills | Status: DC

## 2021-07-16 MED ORDER — METHIMAZOLE 5 MG PO TABS: 5.0000 mg | ORAL_TABLET | Freq: Two times a day (BID) | ORAL | 0 refills | Status: DC

## 2021-07-16 MED ORDER — ACETYLCYSTEINE 20 % IN SOLN
4.0000 mL | Freq: Three times a day (TID) | RESPIRATORY_TRACT | Status: DC
  Administered 2021-07-16: 4 mL via RESPIRATORY_TRACT
  Filled 2021-07-16 (×2): qty 4

## 2021-07-16 NOTE — Patient Outreach (Signed)
[facility]) Care Management  07/16/2021  Cory Kemp September 01, 1940 [id number]   [place] Discipline closure. Patient has been admitted to hospital. Patient will be followed by Complex Care Coordinator once discharged.  Plan: RN Health Coach Discipline Closure Discipline Closure letter sent to PCP  [place] [facility] [phone]

## 2021-07-16 NOTE — Progress Notes (Signed)
Mobility Specialist Progress Note:   07/16/21 1158  Mobility  Activity Ambulated in room  Level of Assistance Modified independent, requires aide device or extra time  Assistive Device None  Distance Ambulated (ft) 8 ft  Mobility Out of bed to chair with meals  Mobility Response Tolerated well  Mobility performed by Mobility specialist  Bed Position Chair  $Mobility charge 1 Mobility   Pt received in bed willing to go to chair for lunch. Pt stated he's leaving and didn't want to do further ambulation. Pt left in chair with call bell in reach and all needs met.    [facility] Public librarian Phone [phone] Secondary Phone [phone]

## 2021-07-16 NOTE — Discharge Summary (Signed)
Cory Kemp WYO:[id number] DOB: September 22, 1940 DOA: 07/11/2021  PCP: Jani Gravel, MD  Admit date: 07/11/2021  Discharge date: 07/16/2021  Admitted From: Home   Disposition:  Home   Recommendations for Outpatient Follow-up:   Follow up with PCP in 1-2 weeks  PCP Please obtain BMP/CBC, 2 view CXR in 1week,  (see Discharge instructions)   PCP Please follow up on the following pending results: Check TSH, free T4, T3, CBC, CMP, 2 view chest x-ray in 7 to 10 days.   Home Health: PT-OT   Equipment/Devices: None  Consultations: None  Discharge Condition: Stable    CODE STATUS: Full    Diet Recommendation: Heart Healthy   Diet Order             Diet - low sodium heart healthy           Diet Heart Room service appropriate? Yes; Fluid consistency: Thin  Diet effective now                    Chief Complaint  Patient presents with   Shortness of Breath     Brief history of present illness from the day of admission and additional interim summary    Cory Kemp is a 80 y.o. male with medical history significant of COPD, diabetes, hypertension, anemia, OSA, sinus tachycardia, alcohol use, BPH, dementia, hyperlipidemia who presents ongoing shortness of breath he was diagnosed with acute hypoxic respiratory failure due to COPD exacerbation and admitted to the hospital on nonrebreather mask.                                                                 Hospital Course   Acute on chronic hypoxic respiratory failure due to COPD exacerbation caused by metapneumovirus infection - he was placed on IV steroids, azithromycin, nebulizer treatments along with oxygen supplementation, at home he uses 2-4 L nasal cannula oxygen on a as needed basis initially was on nonrebreather mask currently on down to 2 L nasal cannula  symptom-free, has shown rapid improvement, no wheezing on exam today, will be given short steroid taper with outpatient follow-up with PCP and his primary pulmonologist in 7 to 10 days postdischarge  SpO2: 91 % O2 Flow Rate (L/min): 2 L/min FiO2 (%): 100 %  Recent Labs  Lab 07/11/21 1851 07/11/21 2127 07/12/21 0448 07/12/21 1434 07/13/21 0054 07/14/21 0056 07/15/21 0246 07/16/21 0225  WBC  --   --  6.4  --  6.4 7.0 5.9 7.0  CRP  --   --   --  11.4* 10.3* 3.8* 1.4* 0.7  BNP  --   --   --  196.3* 122.1* 164.9* 245.6* 205.2*  PROCALCITON  --  0.23 0.67  --  0.60  --   --   --  LATICACIDVEN  --  1.3  --   --   --   --   --   --   AST  --   --  23  --  33 30 27 19   ALT  --   --  14  --  17 17 24 24   ALKPHOS  --   --  63  --  56 50 50 43  BILITOT  --   --  0.3  --  0.6 0.5 0.6 0.7  ALBUMIN  --   --  3.3*  --  3.3* 3.1* 3.1* 2.9*  SARSCOV2NAA NEGATIVE  --   --   --   --   --   --   --       2.  SVT at the time of admission.  Resolved, he also had evidence of hyperthyroidism this admission, now stable on Coreg, echocardiogram nonacute, outpatient follow-up with primary cardiologist postdischarge.  Currently in sinus   3.  Dyslipidemia.  Continue statin.   4.  Hypertension.  Pressure was poorly controlled medications have been adjusted with good results, PCP to continue to monitor and adjust   5.  Hypothyroidism with low TSH and high free T4, placed on low-dose methimazole.  Outpatient endocrine follow-up.   Discharge diagnosis     Principal Problem:   Acute on chronic respiratory failure with hypoxia (HCC) Active Problems:   Tachycardia   Essential hypertension   COPD with acute exacerbation (HCC)   SVT (supraventricular tachycardia) (HCC)    Discharge instructions    Discharge Instructions     Diet - low sodium heart healthy   Complete by: As directed    Discharge instructions   Complete by: As directed    Follow with Primary MD Jani Gravel, MD in 7 days   Get  CBC, CMP, TSH, free T4, T3, 2 view Chest X ray -  checked next visit within 1 week by Primary MD     Activity: As tolerated with Full fall precautions use walker/cane & assistance as needed  Disposition Home   Diet: Heart Healthy    Special Instructions: If you have smoked or chewed Tobacco  in the last 2 yrs please stop smoking, stop any regular Alcohol  and or any Recreational drug use.  On your next visit with your primary care physician please Get Medicines reviewed and adjusted.  Please request your Prim.MD to go over all Hospital Tests and Procedure/Radiological results at the follow up, please get all Hospital records sent to your Prim MD by signing hospital release before you go home.  If you experience worsening of your admission symptoms, develop shortness of breath, life threatening emergency, suicidal or homicidal thoughts you must seek medical attention immediately by calling 911 or calling your MD immediately  if symptoms less severe.  You Must read complete instructions/literature along with all the possible adverse reactions/side effects for all the Medicines you take and that have been prescribed to you. Take any new Medicines after you have completely understood and accpet all the possible adverse reactions/side effects.   Increase activity slowly   Complete by: As directed        Discharge Medications   Allergies as of 07/16/2021       Reactions   Adhesive [tape] Rash   No "PLASTIC" tape!!! Patient broke out in blisters!!   Tegaderm Alginate Ag Rope Rash        Medication List     STOP taking these medications  ibuprofen 200 MG tablet Commonly known as: ADVIL   zinc sulfate 220 (50 Zn) MG capsule       TAKE these medications    albuterol (2.5 MG/3ML) 0.083% nebulizer solution Commonly known as: PROVENTIL Take 3 mLs (2.5 mg total) by nebulization every 6 (six) hours as needed for wheezing or shortness of breath. What changed: Another medication  with the same name was changed. Make sure you understand how and when to take each.   albuterol 108 (90 Base) MCG/ACT inhaler Commonly known as: VENTOLIN HFA INHALE 2 PUFFS BY MOUTH EVERY 6 HOURS AS NEEDED FOR WHEEZING OR SHORTNESS OF BREATH What changed: See the new instructions.   ascorbic acid 500 MG tablet Commonly known as: VITAMIN C Take 1 tablet (500 mg total) by mouth daily.   carvedilol 12.5 MG tablet Commonly known as: COREG Take 1 tablet (12.5 mg total) by mouth 2 (two) times daily with a meal.   Daliresp 250 MCG Tabs Generic drug: Roflumilast TAKE 1 TABLET BY MOUTH EVERY MORNING What changed:  how much to take when to take this   escitalopram 20 MG tablet Commonly known as: LEXAPRO Take 20 mg by mouth daily.   fluticasone 50 MCG/ACT nasal spray Commonly known as: Flonase Place 2 sprays into both nostrils daily. Reduce to 1 spray each nostril daily after stopping Afrin   isosorbide mononitrate 30 MG 24 hr tablet Commonly known as: IMDUR Take 1 tablet (30 mg total) by mouth daily. Start taking on: July 17, 2021   losartan 50 MG tablet Commonly known as: COZAAR Take 50 mg by mouth daily.   methimazole 5 MG tablet Commonly known as: TAPAZOLE Take 1 tablet (5 mg total) by mouth 2 (two) times daily.   montelukast 10 MG tablet Commonly known as: SINGULAIR Take 10 mg by mouth daily.   OXYGEN Inhale 4 L into the lungs continuous.   predniSONE 5 MG tablet Commonly known as: DELTASONE Label  & dispense according to the schedule below. take  6 Pills PO for 3 days, 4 Pills PO for 3 days, 2 Pills PO for 3 days, 1/2 Pill  PO for 3 days then STOP. Total 50 pills.   simvastatin 40 MG tablet Commonly known as: ZOCOR Take 40 mg by mouth every evening.   Trelegy Ellipta 100-62.5-25 MCG/ACT Aepb Generic drug: Fluticasone-Umeclidin-Vilant INHALE 1 PUFF BY MOUTH EVERY DAY What changed: See the new instructions.         Follow-up Information     [facility].   Specialty: Home Health Services Why: HHPT,HHOT Contact information: [address] [place] [address] [phone]         Jani Gravel, MD. Schedule an appointment as soon as possible for a visit in 1 week(s).   Specialty: Internal Medicine Contact information: [address] [place] [address] [phone]         Tanda Rockers, MD. Schedule an appointment as soon as possible for a visit in 1 week(s).   Specialty: Pulmonary Disease Contact information: [address] [place] [address] [place]. Schedule an appointment as soon as possible for a visit in 1 week(s).   Contact information: [place] [address] [phone]        Renato Shin, MD. Schedule an appointment as soon as possible for a visit in 1 week(s).  Specialty: Endocrinology Contact information: [address] [place] [address] [phone]         Jerline Pain, MD. Schedule an appointment as soon as possible for a visit in 1 week(s).   Specialty: Cardiology Why: SVT Contact information: [address] [place] [address] [phone]                 Major procedures and Radiology Reports - PLEASE review detailed and final reports thoroughly  -       CT Angio Chest PE W and/or Wo Contrast  Result Date: 07/11/2021 CLINICAL DATA:  Pt c/o increased SOB x 1 day. Pt had O2 sat in 60s in triage. PE suspected, high prob EXAM: CT ANGIOGRAPHY CHEST WITH CONTRAST TECHNIQUE: Multidetector CT imaging of the chest was performed using the standard protocol during bolus administration of intravenous contrast. Multiplanar CT image reconstructions and MIPs were obtained to evaluate the vascular anatomy. CONTRAST:  1mL ISOVUE-370 IOPAMIDOL (ISOVUE-370) INJECTION 76% COMPARISON:  CT angio chest  02/11/2019 FINDINGS: Cardiovascular: Satisfactory opacification of the pulmonary arteries to the segmental level. Limited evaluation of the subsegmental level due to motion artifact. No evidence of pulmonary embolism. The main pulmonary artery is enlarged in caliber. Normal heart size. No significant pericardial effusion. The thoracic aorta is normal in caliber. Severe atherosclerotic plaque of the thoracic aorta. At least 3 vessel coronary artery calcifications. Aortic valve leaflet calcifications. Question aortic arch stent. Mediastinum/Nodes: Enlarged right hilar lymph node measuring up to 2.2 cm. No enlarged mediastinal, hilar, or axillary lymph nodes. Thyroid gland, trachea, and esophagus demonstrate no significant findings. Lungs/Pleura: Severe paraseptal and centrilobular emphysematous changes with a large right lower lobe basilar bulla measuring up to 13 cm. Bilateral lower lobe mild architectural distortion and traction bronchiectasis with superimposed bronchial wall thickening and mucous plugging. Upper Abdomen: Partially visualized fluid density lesion within the right kidney measuring up to at least 5.5 cm. No acute abnormality. Musculoskeletal: No chest wall abnormality. No suspicious lytic or blastic osseous lesions. No acute displaced fracture. Review of the MIP images confirms the above findings. IMPRESSION: 1. Bilateral lower lobe pulmonary scarring/bronchitic changes with superimposed developing infection/inflammation. 2. Emphysema (ICD10-J43.9) - severe with large bulla measuring up to 13 cm in the right lower lobe. 3. Right hilar lymphadenopathy.  Attention on follow-up. 4. Enlarged main pulmonary artery suggestive of pulmonary hypertension. 5.  Aortic Atherosclerosis (ICD10-I70.0). Electronically Signed   By: Iven Finn M.D.   On: 07/11/2021 20:20   DG Chest Port 1 View  Result Date: 07/13/2021 CLINICAL DATA:  Shortness of breath EXAM: PORTABLE CHEST 1 VIEW COMPARISON:  Previous studies  including the examination of 07/12/2021 FINDINGS: Transverse diameter of heart is increased. Thoracic aorta is tortuous and ectatic. Central pulmonary vessels are prominent. Increased interstitial markings are seen in the both parahilar regions and lower lung fields. No focal pulmonary consolidation is seen. There are no signs of alveolar pulmonary edema. Low position of diaphragms suggests COPD. Bullous emphysema is seen in the right lower lung fields. Small transverse linear densities in the left lower lung fields suggest subsegmental atelectasis. There is minimal blunting of lateral CP angles. There is no pneumothorax. IMPRESSION: COPD. Central pulmonary vessels are prominent without signs of alveolar pulmonary edema. Increased interstitial markings are seen in the parahilar regions and lower lung fields suggesting bronchitis or interstitial pneumonitis. No focal pulmonary consolidation is seen. Electronically Signed   By: Elmer Picker M.D.   On: 07/13/2021 08:07  DG Chest Port 1 View  Result Date: 07/12/2021 CLINICAL DATA:  Shortness of breath and cough EXAM: PORTABLE CHEST 1 VIEW COMPARISON:  07/11/2021 FINDINGS: Stable cardiomediastinal contours. No pleural effusion or edema. Chronic interstitial changes of COPD/emphysema noted. Mild bibasilar atelectasis appears unchanged. IMPRESSION: 1. Stable bibasilar atelectasis. 2. COPD/emphysema. Electronically Signed   By: Kerby Moors M.D.   On: 07/12/2021 06:49   DG Chest Port 1 View  Result Date: 07/11/2021 CLINICAL DATA:  Shortness of breath, hypoxia history COPD. EXAM: PORTABLE CHEST 1 VIEW COMPARISON:  PA Lat 11/26/2020. FINDINGS: The cardiac size is normal. The aorta is tortuous and calcified but unchanged in configuration. Lungs are mildly emphysematous with chronic scar-like opacities in the lower lung fields, chronic slight blunting of the right lateral sulcus which could be a tiny chronic pleural effusion or pleurodiaphragmatic adhesion.  The left sulci are sharp. The current study demonstrates increased streaky atelectasis or infiltrate in the left infrahilar/retrocardiac area with partial obscuration of the adjacent diaphragm. No other new lung opacity is seen. Osteopenia and thoracic spondylosis. IMPRESSION: COPD and scarring changes, with increased retrocardiac/infrahilar left lower lobe opacity concerning for pneumonia or aspiration versus atelectasis. Clinical correlation and PA Lat radiographic follow-up recommended. Electronically Signed   By: Telford Nab M.D.   On: 07/11/2021 20:13   ECHOCARDIOGRAM COMPLETE  Result Date: 07/13/2021    ECHOCARDIOGRAM REPORT   Patient Name:   REILY ILIC [facility] Date of Exam: 07/13/2021 Medical Rec #:  [id number]     Height:       73.0 in Accession #:    [id number]    Weight:       199.7 lb Date of Birth:  [birth year]     BSA:          2.150 m Patient Age:    30 years      BP:           170/87 mmHg Patient Gender: M             HR:           89 bpm. Exam Location:  Inpatient Procedure: 2D Echo, Cardiac Doppler, Color Doppler and Intracardiac            Opacification Agent Indications:    I42.9 Cardiomyopathy (unspecified)  History:        Patient has prior history of Echocardiogram examinations, most                 recent 04/20/2020. Abnormal ECG, COPD, Arrythmias:Tachycardia and                 SVT; Risk Factors:Sleep Apnea, Dyslipidemia and Hypertension.                 ETOH. Hypoxia.  Sonographer:    Roseanna Rainbow RDCS Referring Phys: [id number] Margaree Mackintosh [facility]  Sonographer Comments: Technically difficult study due to poor echo windows, suboptimal parasternal window, suboptimal apical window and suboptimal subcostal window. Image acquisition challenging due to respiratory motion and Image acquisition challenging  due to COPD. Extremely difficult study. Patient in high fowler's position and audibly growled with every respiration throughout test. Nearly non- diagnostic exam IMPRESSIONS  1. Left ventricular ejection  fraction, by estimation, is 55 to 60%. Left ventricular ejection fraction by 2D MOD biplane is 58.6 %. The left ventricle has normal function. Left ventricular endocardial border not optimally defined to evaluate regional wall motion. There is moderate left ventricular hypertrophy. Left ventricular diastolic parameters are indeterminate.  2.  Right ventricular systolic function was not well visualized. The right ventricular size is normal. Tricuspid regurgitation signal is inadequate for assessing PA pressure.  3. A small pericardial effusion is present.  4. The mitral valve was not well visualized. No evidence of mitral valve regurgitation.  5. The aortic valve was not well visualized. Aortic valve regurgitation is not visualized. Conclusion(s)/Recommendation(s): Technically difficult study; consider repeating when patient's clinical syndrome has improved. FINDINGS  Left Ventricle: Left ventricular ejection fraction, by estimation, is 55 to 60%. Left ventricular ejection fraction by 2D MOD biplane is 58.6 %. The left ventricle has normal function. Left ventricular endocardial border not optimally defined to evaluate regional wall motion. Definity contrast agent was given IV to delineate the left ventricular endocardial borders. The left ventricular internal cavity size was normal in size. There is moderate left ventricular hypertrophy. Left ventricular diastolic parameters are indeterminate. Right Ventricle: The right ventricular size is normal. No increase in right ventricular wall thickness. Right ventricular systolic function was not well visualized. Tricuspid regurgitation signal is inadequate for assessing PA pressure. Left Atrium: Left atrial size was normal in size. Right Atrium: Right atrial size was normal in size. Pericardium: A small pericardial effusion is present. Mitral Valve: The mitral valve was not well visualized. No evidence of mitral valve regurgitation. Tricuspid Valve: The tricuspid valve is  normal in structure. Tricuspid valve regurgitation is not demonstrated. Aortic Valve: The aortic valve was not well visualized. Aortic valve regurgitation is not visualized. Pulmonic Valve: The pulmonic valve was not well visualized. Pulmonic valve regurgitation is not visualized. No evidence of pulmonic stenosis. Aorta: The aortic root is normal in size and structure. IAS/Shunts: The interatrial septum appears to be lipomatous. The atrial septum is grossly normal.  LEFT VENTRICLE PLAX 2D                        Biplane EF (MOD) LVIDd:         5.25 cm         LV Biplane EF:   Left LVIDs:         4.10 cm                          ventricular LV PW:         1.30 cm                          ejection LV IVS:        1.65 cm                          fraction by LVOT diam:     2.20 cm                          2D MOD LV SV:         64                               biplane is LV SV Index:   30                               58.6 %. LVOT Area:     3.80 cm  Diastology                                LV e' medial:    9.68 cm/s LV Volumes (MOD)               LV E/e' medial:  7.5 LV vol d, MOD    122.0 ml      LV e' lateral:   7.94 cm/s A2C:                           LV E/e' lateral: 9.2 LV vol d, MOD    126.0 ml A4C: LV vol s, MOD    43.3 ml A2C: LV vol s, MOD    58.4 ml A4C: LV SV MOD A2C:   78.7 ml LV SV MOD A4C:   126.0 ml LV SV MOD BP:    73.0 ml RIGHT VENTRICLE RV S prime:     13.80 cm/s TAPSE (M-mode): 2.6 cm LEFT ATRIUM             Index        RIGHT ATRIUM           Index LA diam:        4.30 cm 2.00 cm/m   RA Area:     16.90 cm LA Vol (A2C):   25.9 ml 12.05 ml/m  RA Volume:   46.40 ml  21.58 ml/m LA Vol (A4C):   28.8 ml 13.39 ml/m LA Biplane Vol: 30.2 ml 14.04 ml/m  AORTIC VALVE LVOT Vmax:   92.80 cm/s LVOT Vmean:  59.700 cm/s LVOT VTI:    0.169 m  AORTA Ao Root diam: 3.90 cm MITRAL VALVE MV Area (PHT): 4.50 cm     SHUNTS MV Decel Time: 169 msec     Systemic VTI:  0.17 m MV E velocity:  73.03 cm/s   Systemic Diam: 2.20 cm MV A velocity: 102.47 cm/s MV E/A ratio:  0.71 Rudean Haskell MD Electronically signed by Rudean Haskell MD Signature Date/Time: 07/13/2021/4:27:29 PM    Final     Micro Results     Recent Results (from the past 240 hour(s))  Resp Panel by RT-PCR (Flu A&B, Covid) Nasopharyngeal Swab     Status: None   Collection Time: 07/11/21  6:51 PM   Specimen: Nasopharyngeal Swab; Nasopharyngeal(NP) swabs in vial transport medium  Result Value Ref Range Status   SARS Coronavirus 2 by RT PCR NEGATIVE NEGATIVE Final    Comment: (NOTE) SARS-CoV-2 target nucleic acids are NOT DETECTED.  The SARS-CoV-2 RNA is generally detectable in upper respiratory specimens during the acute phase of infection. The lowest concentration of SARS-CoV-2 viral copies this assay can detect is 138 copies/mL. A negative result does not preclude SARS-Cov-2 infection and should not be used as the sole basis for treatment or other patient management decisions. A negative result may occur with  improper specimen collection/handling, submission of specimen other than nasopharyngeal swab, presence of viral mutation(s) within the areas targeted by this assay, and inadequate number of viral copies(<138 copies/mL). A negative result must be combined with clinical observations, patient history, and epidemiological information. The expected result is Negative.  Fact Sheet for Patients:  EntrepreneurPulse.com.au  Fact Sheet for Healthcare Providers:  IncredibleEmployment.be  This test is no t yet approved or cleared by the [place] FDA and  has been authorized for detection and/or diagnosis of  SARS-CoV-2 by FDA under an Emergency Use Authorization (EUA). This EUA will remain  in effect (meaning this test can be used) for the duration of the COVID-19 declaration under Section 564(b)(1) of the Act, 21 U.S.C.section 360bbb-3(b)(1), unless the  authorization is terminated  or revoked sooner.       Influenza A by PCR NEGATIVE NEGATIVE Final   Influenza B by PCR NEGATIVE NEGATIVE Final    Comment: (NOTE) The Xpert Xpress SARS-CoV-2/FLU/RSV plus assay is intended as an aid in the diagnosis of influenza from Nasopharyngeal swab specimens and should not be used as a sole basis for treatment. Nasal washings and aspirates are unacceptable for Xpert Xpress SARS-CoV-2/FLU/RSV testing.  Fact Sheet for Patients: EntrepreneurPulse.com.au  Fact Sheet for Healthcare Providers: IncredibleEmployment.be  This test is not yet approved or cleared by the [place] FDA and has been authorized for detection and/or diagnosis of SARS-CoV-2 by FDA under an Emergency Use Authorization (EUA). This EUA will remain in effect (meaning this test can be used) for the duration of the COVID-19 declaration under Section 564(b)(1) of the Act, 21 U.S.C. section 360bbb-3(b)(1), unless the authorization is terminated or revoked.  Performed at [facility], [place] [address], [place], [place] [address]   Respiratory (~20 pathogens) panel by PCR     Status: Abnormal   Collection Time: 07/11/21  6:51 PM   Specimen: Nasopharyngeal Swab; Respiratory  Result Value Ref Range Status   Adenovirus NOT DETECTED NOT DETECTED Final   Coronavirus 229E NOT DETECTED NOT DETECTED Final    Comment: (NOTE) The Coronavirus on the Respiratory Panel, DOES NOT test for the novel  Coronavirus (2019 nCoV)    Coronavirus HKU1 NOT DETECTED NOT DETECTED Final   Coronavirus NL63 NOT DETECTED NOT DETECTED Final   Coronavirus OC43 NOT DETECTED NOT DETECTED Final   Metapneumovirus DETECTED (A) NOT DETECTED Final   Rhinovirus / Enterovirus NOT DETECTED NOT DETECTED Final   Influenza A NOT DETECTED NOT DETECTED Final   Influenza B NOT DETECTED NOT DETECTED Final   Parainfluenza Virus 1 NOT DETECTED NOT DETECTED Final   Parainfluenza Virus  2 NOT DETECTED NOT DETECTED Final   Parainfluenza Virus 3 NOT DETECTED NOT DETECTED Final   Parainfluenza Virus 4 NOT DETECTED NOT DETECTED Final   Respiratory Syncytial Virus NOT DETECTED NOT DETECTED Final   Bordetella pertussis NOT DETECTED NOT DETECTED Final   Bordetella Parapertussis NOT DETECTED NOT DETECTED Final   Chlamydophila pneumoniae NOT DETECTED NOT DETECTED Final   Mycoplasma pneumoniae NOT DETECTED NOT DETECTED Final    Comment: Performed at [facility], [address] [place], [place] [address]  Blood culture (routine x 2)     Status: None (Preliminary result)   Collection Time: 07/11/21  9:27 PM   Specimen: BLOOD LEFT HAND  Result Value Ref Range Status   Specimen Description BLOOD LEFT HAND  Final   Special Requests   Final    BOTTLES DRAWN AEROBIC AND ANAEROBIC Blood Culture results may not be optimal due to an inadequate volume of blood received in culture bottles   Culture   Final    NO GROWTH 4 DAYS Performed at [facility], [place] [address] [place], [place] [address]    Report Status PENDING  Incomplete  Blood culture (routine x 2)     Status: None (Preliminary result)   Collection Time: 07/11/21  9:32 PM   Specimen: BLOOD LEFT HAND  Result Value Ref Range Status   Specimen Description  BLOOD LEFT HAND  Final   Special Requests   Final    BOTTLES DRAWN AEROBIC AND ANAEROBIC Blood Culture results may not be optimal due to an inadequate volume of blood received in culture bottles   Culture   Final    NO GROWTH 4 DAYS Performed at [facility], [place] [address], [place], [place] [address]    Report Status PENDING  Incomplete    Today   Subjective    Cory Kemp today has no headache,no chest abdominal pain,no new weakness tingling or numbness, feels much better wants to go home today.     Objective   Blood pressure (!) 136/59, pulse 63, temperature 97.9 F (36.6 C), temperature source Oral, resp. rate 20, height 6\' 1"  (1.854 m),  weight 89 kg, SpO2 91 %.   Intake/Output Summary (Last 24 hours) at 07/16/2021 0953 Last data filed at 07/16/2021 0529 Gross per 24 hour  Intake 1126.11 ml  Output 1800 ml  Net -673.89 ml    Exam  Awake Alert, No new F.N deficits, Normal affect [place].AT,PERRAL Supple Neck,No JVD, No cervical lymphadenopathy appriciated.  Symmetrical Chest wall movement, Good air movement bilaterally, no wheezing RRR,No Gallops,Rubs or new Murmurs, No Parasternal Heave +ve B.Sounds, Abd Soft, Non tender, No organomegaly appriciated, No rebound -guarding or rigidity. No Cyanosis, Clubbing or edema, No new Rash or bruise   Data Review   CBC w Diff:  Lab Results  Component Value Date   WBC 7.0 07/16/2021   HGB 13.4 07/16/2021   HGB 13.3 06/25/2013   HCT 41.1 07/16/2021   HCT 39.8 06/25/2013   PLT 102 (L) 07/16/2021   PLT 165 06/25/2013   LYMPHOPCT 14 07/16/2021   LYMPHOPCT 43.4 06/25/2013   BANDSPCT 5 07/16/2021   MONOPCT 11 07/16/2021   MONOPCT 19.7 (H) 06/25/2013   EOSPCT 0 07/16/2021   EOSPCT 1.4 06/25/2013   BASOPCT 0 07/16/2021   BASOPCT 0.3 06/25/2013    CMP:  Lab Results  Component Value Date   NA 137 07/16/2021   NA 140 02/20/2013   K 4.6 07/16/2021   K 4.1 02/20/2013   CL 101 07/16/2021   CO2 31 07/16/2021   CO2 26 02/20/2013   BUN 31 (H) 07/16/2021   BUN 12.8 02/20/2013   CREATININE 0.80 07/16/2021   CREATININE 0.8 02/20/2013   PROT 5.5 (L) 07/16/2021   PROT 7.0 02/20/2013   ALBUMIN 2.9 (L) 07/16/2021   ALBUMIN 3.7 02/20/2013   BILITOT 0.7 07/16/2021   BILITOT 0.59 02/20/2013   ALKPHOS 43 07/16/2021   ALKPHOS 63 02/20/2013   AST 19 07/16/2021   AST 14 02/20/2013   ALT 24 07/16/2021   ALT 10 02/20/2013  . Lab Results  Component Value Date   TSH 0.177 (L) 07/14/2021     Total Time in preparing paper work, data evaluation and todays exam - 42 minutes  Lala Lund M.D on 07/16/2021 at 9:53 AM  [facility]

## 2021-07-16 NOTE — Progress Notes (Signed)
Pt refused standing weight at this time. Will endorse to day shift staff to obtain weight, unless pt agreeable before 7am.

## 2021-07-16 NOTE — Progress Notes (Signed)
O2 sat = 99% on 4L [place] at midnight. Pt weaned to 2L [place].

## 2021-07-16 NOTE — Discharge Instructions (Addendum)
Follow with Primary MD Jani Gravel, MD in 7 days   Get CBC, CMP, TSH, free T4, T3, 2 view Chest X ray -  checked next visit within 1 week by Primary MD     Activity: As tolerated with Full fall precautions use walker/cane & assistance as needed  Disposition Home   Diet: Heart Healthy    Special Instructions: If you have smoked or chewed Tobacco  in the last 2 yrs please stop smoking, stop any regular Alcohol  and or any Recreational drug use.  On your next visit with your primary care physician please Get Medicines reviewed and adjusted.  Please request your Prim.MD to go over all Hospital Tests and Procedure/Radiological results at the follow up, please get all Hospital records sent to your Prim MD by signing hospital release before you go home.  If you experience worsening of your admission symptoms, develop shortness of breath, life threatening emergency, suicidal or homicidal thoughts you must seek medical attention immediately by calling 911 or calling your MD immediately  if symptoms less severe.  You Must read complete instructions/literature along with all the possible adverse reactions/side effects for all the Medicines you take and that have been prescribed to you. Take any new Medicines after you have completely understood and accpet all the possible adverse reactions/side effects.

## 2021-07-16 NOTE — Care Management Important Message (Signed)
Important Message  Patient Details  Name: Cory Kemp MRN: [id number] Date of Birth: 1941/04/09   Medicare Important Message Given:  Yes     Shelda Altes 07/16/2021, 9:46 AM

## 2021-07-16 NOTE — TOC Transition Note (Signed)
Transition of Care [facility]) - CM/SW Discharge Note   Patient Details  Name: Cory Kemp MRN: [id number] Date of Birth: Feb 07, 1941  [facility]) CM/SW Contact:  Zenon Mayo, RN Phone Number: 07/16/2021, 12:14 PM   Clinical Narrative:    Patient is for dc today, [place] with [place] notified.    Final next level of care: [place] Barriers to Discharge: Continued Medical Work up   Patient Goals and CMS Choice Patient states their goals for this hospitalization and ongoing recovery are:: return home CMS Medicare.gov Compare Post Acute Care list provided to:: Patient Choice offered to / list presented to : Patient  Discharge Placement                       Discharge Plan and Services In-house Referral: NA Discharge Planning Services: CM Consult Post Acute Care Choice: Home Health            DME Agency: NA       HH Arranged: PT, OT HH Agency: [place] Date [place]: 07/13/21 Time [place]: [id number] Representative spoke with at [place]: [place]  Social Determinants of Health ([place]) Interventions     Readmission Risk Interventions No flowsheet data found.

## 2021-07-16 NOTE — Progress Notes (Signed)
OT Cancellation Note  Patient Details Name: Cory Kemp MRN: [id number] DOB: November 12, 1940   Cancelled Treatment:    Reason Eval/Treat Not Completed: Patient declined, no reason specified (Patient stated he did not want to participate today due to expected to leave at 11 today.) Lodema Hong, [place]  Pager [phone] Office [place] 07/16/2021, 9:25 AM

## 2021-07-19 ENCOUNTER — Other Ambulatory Visit: Payer: Self-pay | Admitting: *Deleted

## 2021-07-19 NOTE — Patient Outreach (Signed)
Cory Kemp [facility]) Care Management  07/19/2021  Cory Kemp March 31, 1941 [id number]   Referral Date: 12/12 Referral Source: Hospital liaison Referral Reason: Recent hospital discharge Insurance: [place] attempt #1, unsuccessful, unable to leave voice message.  Plan: RN CM will send outreach letter and follow up within the next 3-4 business days.  [name], [place], MSN [place] [phone]

## 2021-07-19 NOTE — Patient Outreach (Signed)
Received a Hospital Liaison referral from Natividad Brood  For complex care and disease management follow up calls.   I assigned to [name], Therapist, sports.

## 2021-07-22 ENCOUNTER — Other Ambulatory Visit: Payer: Self-pay | Admitting: *Deleted

## 2021-07-22 DIAGNOSIS — M1990 Unspecified osteoarthritis, unspecified site: Secondary | ICD-10-CM | POA: Diagnosis not present

## 2021-07-22 DIAGNOSIS — D649 Anemia, unspecified: Secondary | ICD-10-CM | POA: Diagnosis not present

## 2021-07-22 DIAGNOSIS — I10 Essential (primary) hypertension: Secondary | ICD-10-CM | POA: Diagnosis not present

## 2021-07-22 DIAGNOSIS — R69 Illness, unspecified: Secondary | ICD-10-CM | POA: Diagnosis not present

## 2021-07-22 DIAGNOSIS — E119 Type 2 diabetes mellitus without complications: Secondary | ICD-10-CM | POA: Diagnosis not present

## 2021-07-22 DIAGNOSIS — E538 Deficiency of other specified B group vitamins: Secondary | ICD-10-CM | POA: Diagnosis not present

## 2021-07-22 DIAGNOSIS — J9621 Acute and chronic respiratory failure with hypoxia: Secondary | ICD-10-CM | POA: Diagnosis not present

## 2021-07-22 DIAGNOSIS — J9612 Chronic respiratory failure with hypercapnia: Secondary | ICD-10-CM | POA: Diagnosis not present

## 2021-07-22 DIAGNOSIS — J441 Chronic obstructive pulmonary disease with (acute) exacerbation: Secondary | ICD-10-CM | POA: Diagnosis not present

## 2021-07-22 DIAGNOSIS — N400 Enlarged prostate without lower urinary tract symptoms: Secondary | ICD-10-CM | POA: Diagnosis not present

## 2021-07-22 DIAGNOSIS — M8580 Other specified disorders of bone density and structure, unspecified site: Secondary | ICD-10-CM | POA: Diagnosis not present

## 2021-07-22 NOTE — Patient Outreach (Signed)
[place] [facility]) Care Management  07/22/2021  [name] October 30, 1940 [id number]   Referral Date: 12/12 Referral Source: Hospital liaison Referral Reason: Recent hospital discharge Insurance: [place] attempt #2, unsuccessful as member state the nurse is currently in the home, unable to talk at this time.  Request made to call this care manager back.  If no call back, will follow up within the next 3-4 business days.  [name], [place], MSN [place] [phone]

## 2021-07-26 DIAGNOSIS — R3129 Other microscopic hematuria: Secondary | ICD-10-CM | POA: Diagnosis not present

## 2021-07-26 DIAGNOSIS — R7303 Prediabetes: Secondary | ICD-10-CM | POA: Diagnosis not present

## 2021-07-27 ENCOUNTER — Other Ambulatory Visit: Payer: Self-pay | Admitting: *Deleted

## 2021-07-27 DIAGNOSIS — D649 Anemia, unspecified: Secondary | ICD-10-CM | POA: Diagnosis not present

## 2021-07-27 DIAGNOSIS — R059 Cough, unspecified: Secondary | ICD-10-CM | POA: Diagnosis not present

## 2021-07-27 DIAGNOSIS — Z9981 Dependence on supplemental oxygen: Secondary | ICD-10-CM | POA: Diagnosis not present

## 2021-07-27 DIAGNOSIS — Z23 Encounter for immunization: Secondary | ICD-10-CM | POA: Diagnosis not present

## 2021-07-27 DIAGNOSIS — E7800 Pure hypercholesterolemia, unspecified: Secondary | ICD-10-CM | POA: Diagnosis not present

## 2021-07-27 DIAGNOSIS — I4891 Unspecified atrial fibrillation: Secondary | ICD-10-CM | POA: Diagnosis not present

## 2021-07-27 DIAGNOSIS — E538 Deficiency of other specified B group vitamins: Secondary | ICD-10-CM | POA: Diagnosis not present

## 2021-07-27 DIAGNOSIS — E119 Type 2 diabetes mellitus without complications: Secondary | ICD-10-CM | POA: Diagnosis not present

## 2021-07-27 DIAGNOSIS — R69 Illness, unspecified: Secondary | ICD-10-CM | POA: Diagnosis not present

## 2021-07-27 DIAGNOSIS — Z09 Encounter for follow-up examination after completed treatment for conditions other than malignant neoplasm: Secondary | ICD-10-CM | POA: Diagnosis not present

## 2021-07-27 DIAGNOSIS — I10 Essential (primary) hypertension: Secondary | ICD-10-CM | POA: Diagnosis not present

## 2021-07-27 DIAGNOSIS — J9612 Chronic respiratory failure with hypercapnia: Secondary | ICD-10-CM | POA: Diagnosis not present

## 2021-07-27 DIAGNOSIS — J441 Chronic obstructive pulmonary disease with (acute) exacerbation: Secondary | ICD-10-CM | POA: Diagnosis not present

## 2021-07-27 DIAGNOSIS — M1990 Unspecified osteoarthritis, unspecified site: Secondary | ICD-10-CM | POA: Diagnosis not present

## 2021-07-27 DIAGNOSIS — R7303 Prediabetes: Secondary | ICD-10-CM | POA: Diagnosis not present

## 2021-07-27 DIAGNOSIS — M8580 Other specified disorders of bone density and structure, unspecified site: Secondary | ICD-10-CM | POA: Diagnosis not present

## 2021-07-27 DIAGNOSIS — J9620 Acute and chronic respiratory failure, unspecified whether with hypoxia or hypercapnia: Secondary | ICD-10-CM | POA: Diagnosis not present

## 2021-07-27 DIAGNOSIS — N400 Enlarged prostate without lower urinary tract symptoms: Secondary | ICD-10-CM | POA: Diagnosis not present

## 2021-07-27 NOTE — Patient Outreach (Addendum)
[facility] [facility]) Care Management  07/27/2021  TYRIS ELIOT May 18, 1941 [id number]   Referral Date: 12/12 Referral Source: Hospital liaison Referral Reason: Recent hospital discharge Insurance: D[employer]   Outreach attempt #3 to member, unsuccessful, mailbox full.  Call placed to daughter, she report they (she and member) are currently at a doctor's appointment and she will call this care manager back.  Plan: RN CM will await call back, if no call back will follow up within the next 3 weeks.   Update:  Incoming all received back from daughter.  State member is "ok" since being discharged from hospital.  His oxygen saturations on oxygen has remained around 88% at rest but continues to drop with any type of activity.  His condition has and is declining, seen by PCP today.  Referral to [facility] for hospice versus palliative was placed, daughter will await call from them.  Provided with their contact information in case she miss their call.  Denies any urgent needs at this time, will follow within the next week in regards to decision on hospice, encouraged to contact this care manager with questions.    [name], RN, MSN, [place] Manager [phone]

## 2021-07-29 ENCOUNTER — Other Ambulatory Visit: Payer: Self-pay | Admitting: Pulmonary Disease

## 2021-07-29 DIAGNOSIS — M8580 Other specified disorders of bone density and structure, unspecified site: Secondary | ICD-10-CM | POA: Diagnosis not present

## 2021-07-29 DIAGNOSIS — J441 Chronic obstructive pulmonary disease with (acute) exacerbation: Secondary | ICD-10-CM | POA: Diagnosis not present

## 2021-07-29 DIAGNOSIS — N400 Enlarged prostate without lower urinary tract symptoms: Secondary | ICD-10-CM | POA: Diagnosis not present

## 2021-07-29 DIAGNOSIS — M1990 Unspecified osteoarthritis, unspecified site: Secondary | ICD-10-CM | POA: Diagnosis not present

## 2021-07-29 DIAGNOSIS — R69 Illness, unspecified: Secondary | ICD-10-CM | POA: Diagnosis not present

## 2021-07-29 DIAGNOSIS — E538 Deficiency of other specified B group vitamins: Secondary | ICD-10-CM | POA: Diagnosis not present

## 2021-07-29 DIAGNOSIS — I10 Essential (primary) hypertension: Secondary | ICD-10-CM | POA: Diagnosis not present

## 2021-07-29 DIAGNOSIS — J9612 Chronic respiratory failure with hypercapnia: Secondary | ICD-10-CM | POA: Diagnosis not present

## 2021-07-29 DIAGNOSIS — E119 Type 2 diabetes mellitus without complications: Secondary | ICD-10-CM | POA: Diagnosis not present

## 2021-07-29 DIAGNOSIS — D649 Anemia, unspecified: Secondary | ICD-10-CM | POA: Diagnosis not present

## 2021-08-03 ENCOUNTER — Telehealth: Payer: Self-pay

## 2021-08-03 NOTE — Telephone Encounter (Signed)
Spoke with patient's daughter [name] and scheduled an in-person Palliative Consult for 08/11/21 @ 9 AM.   COVID screening was negative. No pets in home. Patient lives with roommate.  Consent obtained; updated Outlook/Netsmart/Team List and Epic.   Family is aware they may be receiving a call from provider the day before or day of to confirm appointment.

## 2021-08-04 DIAGNOSIS — I10 Essential (primary) hypertension: Secondary | ICD-10-CM | POA: Diagnosis not present

## 2021-08-04 DIAGNOSIS — J441 Chronic obstructive pulmonary disease with (acute) exacerbation: Secondary | ICD-10-CM | POA: Diagnosis not present

## 2021-08-04 DIAGNOSIS — J9612 Chronic respiratory failure with hypercapnia: Secondary | ICD-10-CM | POA: Diagnosis not present

## 2021-08-04 DIAGNOSIS — M8580 Other specified disorders of bone density and structure, unspecified site: Secondary | ICD-10-CM | POA: Diagnosis not present

## 2021-08-05 ENCOUNTER — Other Ambulatory Visit: Payer: Self-pay | Admitting: *Deleted

## 2021-08-05 DIAGNOSIS — J441 Chronic obstructive pulmonary disease with (acute) exacerbation: Secondary | ICD-10-CM | POA: Diagnosis not present

## 2021-08-05 DIAGNOSIS — R69 Illness, unspecified: Secondary | ICD-10-CM | POA: Diagnosis not present

## 2021-08-05 DIAGNOSIS — J9612 Chronic respiratory failure with hypercapnia: Secondary | ICD-10-CM | POA: Diagnosis not present

## 2021-08-05 DIAGNOSIS — M8580 Other specified disorders of bone density and structure, unspecified site: Secondary | ICD-10-CM | POA: Diagnosis not present

## 2021-08-05 DIAGNOSIS — D649 Anemia, unspecified: Secondary | ICD-10-CM | POA: Diagnosis not present

## 2021-08-05 DIAGNOSIS — E119 Type 2 diabetes mellitus without complications: Secondary | ICD-10-CM | POA: Diagnosis not present

## 2021-08-05 DIAGNOSIS — E538 Deficiency of other specified B group vitamins: Secondary | ICD-10-CM | POA: Diagnosis not present

## 2021-08-05 DIAGNOSIS — I10 Essential (primary) hypertension: Secondary | ICD-10-CM | POA: Diagnosis not present

## 2021-08-05 DIAGNOSIS — M1990 Unspecified osteoarthritis, unspecified site: Secondary | ICD-10-CM | POA: Diagnosis not present

## 2021-08-05 DIAGNOSIS — N400 Enlarged prostate without lower urinary tract symptoms: Secondary | ICD-10-CM | POA: Diagnosis not present

## 2021-08-05 NOTE — Patient Outreach (Signed)
[place] [facility]) Care Management  08/05/2021  KAUSHIK MAUL 1940/11/10 [id number]   Outgoing call placed to member's daughter to follow up on decision for palliative versus hospice, no answer, HIPAA compliant voice message left.  Noted per chart that member has home visit with [facility] on 1/4.  Will follow up within the next 4 business days, after home visit is complete.  [name], RN, MSN, [place] Manager [phone]

## 2021-08-06 DIAGNOSIS — J9612 Chronic respiratory failure with hypercapnia: Secondary | ICD-10-CM | POA: Diagnosis not present

## 2021-08-06 DIAGNOSIS — D649 Anemia, unspecified: Secondary | ICD-10-CM | POA: Diagnosis not present

## 2021-08-06 DIAGNOSIS — M1990 Unspecified osteoarthritis, unspecified site: Secondary | ICD-10-CM | POA: Diagnosis not present

## 2021-08-06 DIAGNOSIS — J441 Chronic obstructive pulmonary disease with (acute) exacerbation: Secondary | ICD-10-CM | POA: Diagnosis not present

## 2021-08-06 DIAGNOSIS — E538 Deficiency of other specified B group vitamins: Secondary | ICD-10-CM | POA: Diagnosis not present

## 2021-08-06 DIAGNOSIS — N400 Enlarged prostate without lower urinary tract symptoms: Secondary | ICD-10-CM | POA: Diagnosis not present

## 2021-08-06 DIAGNOSIS — E119 Type 2 diabetes mellitus without complications: Secondary | ICD-10-CM | POA: Diagnosis not present

## 2021-08-06 DIAGNOSIS — I10 Essential (primary) hypertension: Secondary | ICD-10-CM | POA: Diagnosis not present

## 2021-08-06 DIAGNOSIS — R69 Illness, unspecified: Secondary | ICD-10-CM | POA: Diagnosis not present

## 2021-08-06 DIAGNOSIS — M8580 Other specified disorders of bone density and structure, unspecified site: Secondary | ICD-10-CM | POA: Diagnosis not present

## 2021-08-07 DIAGNOSIS — J441 Chronic obstructive pulmonary disease with (acute) exacerbation: Secondary | ICD-10-CM | POA: Diagnosis not present

## 2021-08-07 DIAGNOSIS — J449 Chronic obstructive pulmonary disease, unspecified: Secondary | ICD-10-CM | POA: Diagnosis not present

## 2021-08-10 ENCOUNTER — Encounter: Payer: Self-pay | Admitting: *Deleted

## 2021-08-10 NOTE — Telephone Encounter (Signed)
This encounter was created in error - please disregard.

## 2021-08-11 ENCOUNTER — Other Ambulatory Visit: Payer: Self-pay

## 2021-08-11 ENCOUNTER — Other Ambulatory Visit: Payer: Medicare Other | Admitting: Hospice

## 2021-08-11 DIAGNOSIS — J9612 Chronic respiratory failure with hypercapnia: Secondary | ICD-10-CM | POA: Diagnosis not present

## 2021-08-11 DIAGNOSIS — Z515 Encounter for palliative care: Secondary | ICD-10-CM | POA: Diagnosis not present

## 2021-08-11 DIAGNOSIS — M8580 Other specified disorders of bone density and structure, unspecified site: Secondary | ICD-10-CM | POA: Diagnosis not present

## 2021-08-11 DIAGNOSIS — N400 Enlarged prostate without lower urinary tract symptoms: Secondary | ICD-10-CM | POA: Diagnosis not present

## 2021-08-11 DIAGNOSIS — J9621 Acute and chronic respiratory failure with hypoxia: Secondary | ICD-10-CM

## 2021-08-11 DIAGNOSIS — E538 Deficiency of other specified B group vitamins: Secondary | ICD-10-CM | POA: Diagnosis not present

## 2021-08-11 DIAGNOSIS — J449 Chronic obstructive pulmonary disease, unspecified: Secondary | ICD-10-CM

## 2021-08-11 DIAGNOSIS — J441 Chronic obstructive pulmonary disease with (acute) exacerbation: Secondary | ICD-10-CM | POA: Diagnosis not present

## 2021-08-11 DIAGNOSIS — I10 Essential (primary) hypertension: Secondary | ICD-10-CM | POA: Diagnosis not present

## 2021-08-11 DIAGNOSIS — R69 Illness, unspecified: Secondary | ICD-10-CM | POA: Diagnosis not present

## 2021-08-11 DIAGNOSIS — E119 Type 2 diabetes mellitus without complications: Secondary | ICD-10-CM | POA: Diagnosis not present

## 2021-08-11 DIAGNOSIS — D649 Anemia, unspecified: Secondary | ICD-10-CM | POA: Diagnosis not present

## 2021-08-11 DIAGNOSIS — M1990 Unspecified osteoarthritis, unspecified site: Secondary | ICD-10-CM | POA: Diagnosis not present

## 2021-08-11 NOTE — Progress Notes (Signed)
[place] Consult Note Telephone: [phone]  Fax: [phone]  PATIENT NAME: Cory Kemp [address] [place] [address] [id number] (home)  DOB: 1941/06/18 MRN: [id number]  PRIMARY CARE PROVIDER:    Jani Gravel, MD,  [address] [place] [address] [phone]  REFERRING PROVIDER:   Jani Kemp, [place],  [place] [address] [place], with same practice  RESPONSIBLE PARTY:   Self/Cory Kemp Patient [phone] best to call Contact Information     Name Relation Home Work Mobile   Cory Kemp   [id number]   [name], [name]   [phone]      I met face to face with patient and family at home. Palliative Care was asked to follow this patient by consultation request of  Cory Gravel, MD to address advance care planning, complex medical decision making and goals of care clarification. Cory Kemp is present with patient during visit.This is the initial visit.    ASSESSMENT AND / RECOMMENDATIONS:   Advance Care Planning: Our advance care planning conversation included a discussion about:    The value and importance of advance care planning  Difference between Hospice and Palliative care Exploration of goals of care in the event of a sudden injury or illness  Identification and preparation of a healthcare agent  Review and updating or creation of an  advance directive document . Decision not to resuscitate or to de-escalate disease focused treatments due to poor prognosis.  CODE STATUS: Implications and ramifications of code status discussed. Patient elects to be a Partial code; wishes to have chest compressions and ACLS medications, does not want mechanical ventilation/life support.  Goals of Care: Goals include to maximize quality of life and symptom management. Family interested in hospice service when patient qualifies for it.   I  spent 46  minutes providing this initial consultation. More than 50% of the time in this consultation was spent on counseling patient and coordinating communication. --------------------------------------------------------------------------------------------------------------------------------------  Symptom Management/Plan: Acute and chronic respiratory failure with hypoxia: related to pneumonia - hospitalized 12/4 -07/16/2021. Patient treated with abx. Improved. Continue of 4 L/Min. COPD:Managed with Albuterol, Trelegy, Roflumilast, Prednisone. Deep slow breathing, avoidance of triggers discussed. Patient stopped smoking 3 years ago. Validation provided. Continue with Pulmonologist as planned. Oxygen supplementation is ongoing. Follow up: Palliative care will continue to follow for complex medical decision making, advance care planning, and clarification of goals. Return 6 weeks or prn. Encouraged to call provider sooner with any concerns.   Family /Caregiver/Community Supports: Patient lives at home with a flat mate. His Kemp helps to manage his medications. Strong family support system identified. [place]  HOSPICE ELIGIBILITY/DIAGNOSIS: TBD  Chief Complaint: Initial Palliative care visit  HISTORY OF PRESENT ILLNESS:  Cory Kemp is a 81 y.o. year old male  with multiple medical conditions including Acute and chronic respiratory failure with hypoxia: related to pneumonia for which he was hospitalized, 12/4 -07/16/2021. Patient treated with abx and discharged. Shortness of breath impairs his ADLs; shortness of breath is improving as ongoing oxygen supplementation is helpful. Patient denies pain/discomfort. History of COPD, HNT, prediabetes.  History obtained from review of EMR, discussion with primary team, caregiver, family and/or Cory Kemp.  Review and summarization of Epic records shows history from other than patient. Rest of 10 point ROS asked and negative.  I  reviewed as needed, available labs, patient records, imaging, studies  and related documents from the EMR.   ROS General: NAD EYES: denies vision changes ENMT: denies dysphagia Cardiovascular: denies chest pain/discomfort Pulmonary: denies cough, denies SOB during visit Abdomen: endorses good appetite, denies constipation/diarrhea GU: denies dysuria, urinary frequency MSK:  endorses weakness,  no falls reported Skin: denies rashes or wounds Neurological: denies pain, denies insomnia Psych: Endorses positive mood Heme/lymph/immuno: denies bruises, abnormal bleeding  Physical Exam: Constitutional: NAD General: Well groomed, cooperative, hard of hearing EYES: anicteric sclera, lids intact, no discharge  ENMT: Moist mucous membrane CV: S1 S2, RRR, no LE edema Pulmonary: LCTA, no increased work of breathing, no cough, oxygen supplementation 4L/Min Abdomen: active BS + 4 quadrants, soft and non tender GU: no suprapubic tenderness MSK: weakness, ambulatory without assistive device Skin: warm and dry, no rashes or wounds on visible skin Neuro:  weakness, otherwise non focal Psych: non-anxious affect Hem/lymph/immuno: no widespread bruising   PAST MEDICAL HISTORY:  Active Ambulatory Problems    Diagnosis Date Noted   Diabetes mellitus ([place]) 01/10/2012   Former smoker 03/27/2012   Leukopenia 01/29/2013   Monocytosis 01/29/2013   Normochromic anemia 01/29/2013   B12 deficiency 11/03/2013   COPD (chronic obstructive pulmonary disease) ([place]) 01/11/2016   Tachycardia 04/11/2016   Essential hypertension 04/11/2016   OSA (obstructive sleep apnea) 06/14/2017   Abnormal finding on EKG 01/03/2018   Chronic respiratory failure with hypoxia ([place]) 12/13/2018   CAP (community acquired pneumonia) 12/25/2018   Elevated d-dimer 12/25/2018   COPD with acute exacerbation ([place]) 02/08/2019   Hypertensive urgency 02/08/2019   Medication management 03/05/2019   Elevated troponin 04/20/2020    PVC's (premature ventricular contractions) 04/20/2020   Pneumonia due to COVID-19 virus 04/20/2020   Acute on chronic respiratory failure with hypoxia (HCC) 07/11/2021   BPH (benign prostatic hyperplasia) 01/31/2013   Chronic neck pain 07/11/2021   History of malignant neoplasm of prostate 07/11/2021   Oxygen dependent 07/11/2021   Pure hypercholesterolemia 07/11/2021   SVT (supraventricular tachycardia) ([place]) 07/12/2021   Resolved Ambulatory Problems    Diagnosis Date Noted   Inguinal hernia - right 01/10/2012   Incisional hernias - swiss-cheese-type periumbilical 68/37/2902   Femoral hernia, right 03/27/2012   Acute on chronic respiratory failure with hypoxia (HCC) 12/25/2018   Acute exacerbation of chronic obstructive pulmonary disease (COPD) ([place]) 02/11/2019   Hypoxia 04/19/2020   Acute respiratory disease due to COVID-19 virus 04/20/2020   Past Medical History:  Diagnosis Date   Alcohol abuse    Anemia    Arthritis    Asthma    BPH (benign prostatic hypertrophy)    Dementia (HCC)    Glucose intolerance (impaired glucose tolerance)    Hyperlipidemia    Hypertension    Osteopenia     SOCIAL HX:  Social History   Tobacco Use   Smoking status: Former    Packs/day: 1.00    Years: 63.00    Pack years: 63.00    Types: Cigarettes    Start date: 08/09/1955    Quit date: 08/08/2018    Years since quitting: 3.0   Smokeless tobacco: Never  Substance Use Topics   Alcohol use: No    Alcohol/week: 0.0 standard drinks     FAMILY HX:  Family History  Problem Relation Age of Onset   Lung cancer Father    Lung cancer Mother    Diabetes Brother       ALLERGIES:  Allergies  Allergen Reactions   Adhesive [Tape] Rash    No "PLASTIC" tape!!! Patient broke  out in blisters!!   Tegaderm Alginate Ag Rope Rash      PERTINENT MEDICATIONS:  Outpatient Encounter Medications as of 08/11/2021  Medication Sig   albuterol (PROVENTIL) (2.5 MG/3ML) 0.083% nebulizer solution Take 3 mLs  (2.5 mg total) by nebulization every 6 (six) hours as needed for wheezing or shortness of breath.   albuterol (VENTOLIN HFA) 108 (90 Base) MCG/ACT inhaler INHALE 2 PUFFS BY MOUTH EVERY 6 HOURS AS NEEDED FOR WHEEZING OR SHORTNESS OF BREATH (Patient taking differently: 2 puffs every 6 (six) hours as needed for shortness of breath or wheezing.)   ascorbic acid (VITAMIN C) 500 MG tablet Take 1 tablet (500 mg total) by mouth daily.   carvedilol (COREG) 12.5 MG tablet Take 1 tablet (12.5 mg total) by mouth 2 (two) times daily with a meal.   escitalopram (LEXAPRO) 20 MG tablet Take 20 mg by mouth daily.   fluticasone (FLONASE) 50 MCG/ACT nasal spray Place 2 sprays into both nostrils daily. Reduce to 1 spray each nostril daily after stopping Afrin   isosorbide mononitrate (IMDUR) 30 MG 24 hr tablet Take 1 tablet (30 mg total) by mouth daily.   losartan (COZAAR) 50 MG tablet Take 50 mg by mouth daily.   methimazole (TAPAZOLE) 5 MG tablet Take 1 tablet (5 mg total) by mouth 2 (two) times daily.   montelukast (SINGULAIR) 10 MG tablet Take 10 mg by mouth daily.   OXYGEN Inhale 4 L into the lungs continuous.   predniSONE (DELTASONE) 5 MG tablet Label  & dispense according to the schedule below. take  6 Pills PO for 3 days, 4 Pills PO for 3 days, 2 Pills PO for 3 days, 1/2 Pill  PO for 3 days then STOP. Total 50 pills.   Roflumilast 250 MCG TABS TAKE 1 TABLET BY MOUTH EVERY MORNING   simvastatin (ZOCOR) 40 MG tablet Take 40 mg by mouth every evening.   TRELEGY ELLIPTA 100-62.5-25 MCG/INH AEPB INHALE 1 PUFF BY MOUTH EVERY DAY (Patient taking differently: Inhale 1 puff into the lungs daily.)   No facility-administered encounter medications on file as of 08/11/2021.     Thank you for the opportunity to participate in the care of Mr. Marrufo.  The palliative care team will continue to follow. Please call our office at [phone] if we can be of additional assistance.   Note: Portions of this note were generated  with Lobbyist. Dictation errors may occur despite best attempts at proofreading.  Teodoro Spray, NP

## 2021-08-12 ENCOUNTER — Other Ambulatory Visit: Payer: Self-pay | Admitting: *Deleted

## 2021-08-12 NOTE — Patient Outreach (Signed)
[place] [facility]) Care Management  08/12/2021  SISTO GRANILLO [birth year] [id number]   Outgoing call placed to daughter to follow up on visit with [facility], no answer, HIPAA compliant voice message left.  Will follow up within the next 3-4 business days.  [name], RN, MSN, [place] Manager [phone]

## 2021-08-17 ENCOUNTER — Other Ambulatory Visit: Payer: Self-pay | Admitting: *Deleted

## 2021-08-17 DIAGNOSIS — J9612 Chronic respiratory failure with hypercapnia: Secondary | ICD-10-CM | POA: Diagnosis not present

## 2021-08-17 DIAGNOSIS — I10 Essential (primary) hypertension: Secondary | ICD-10-CM | POA: Diagnosis not present

## 2021-08-17 DIAGNOSIS — E538 Deficiency of other specified B group vitamins: Secondary | ICD-10-CM | POA: Diagnosis not present

## 2021-08-17 DIAGNOSIS — M1990 Unspecified osteoarthritis, unspecified site: Secondary | ICD-10-CM | POA: Diagnosis not present

## 2021-08-17 DIAGNOSIS — M8580 Other specified disorders of bone density and structure, unspecified site: Secondary | ICD-10-CM | POA: Diagnosis not present

## 2021-08-17 DIAGNOSIS — R69 Illness, unspecified: Secondary | ICD-10-CM | POA: Diagnosis not present

## 2021-08-17 DIAGNOSIS — J441 Chronic obstructive pulmonary disease with (acute) exacerbation: Secondary | ICD-10-CM | POA: Diagnosis not present

## 2021-08-17 DIAGNOSIS — D649 Anemia, unspecified: Secondary | ICD-10-CM | POA: Diagnosis not present

## 2021-08-17 DIAGNOSIS — E119 Type 2 diabetes mellitus without complications: Secondary | ICD-10-CM | POA: Diagnosis not present

## 2021-08-17 DIAGNOSIS — N400 Enlarged prostate without lower urinary tract symptoms: Secondary | ICD-10-CM | POA: Diagnosis not present

## 2021-08-17 NOTE — Patient Outreach (Signed)
[facility]) Care Management  08/17/2021  Cory Kemp [birth year] [id number]   Outreach attempt #2 successful to daughter.  Confirms that member is now active with [facility].  She denies any current needs/questions but will contact this care manager should situation change.  Will close case at this time.  [name], RN, MSN, [place] Manager [phone]

## 2021-08-25 ENCOUNTER — Other Ambulatory Visit: Payer: Self-pay

## 2021-08-25 ENCOUNTER — Other Ambulatory Visit: Payer: Medicare Other | Admitting: Hospice

## 2021-08-25 DIAGNOSIS — Z515 Encounter for palliative care: Secondary | ICD-10-CM

## 2021-08-25 DIAGNOSIS — J9621 Acute and chronic respiratory failure with hypoxia: Secondary | ICD-10-CM | POA: Diagnosis not present

## 2021-08-25 DIAGNOSIS — J449 Chronic obstructive pulmonary disease, unspecified: Secondary | ICD-10-CM | POA: Diagnosis not present

## 2021-08-25 NOTE — Progress Notes (Signed)
Cory Kemp Consult Note Telephone: [phone]  Fax: [phone]  PATIENT NAME: Cory Kemp [address] [place] [address] [place] [address] [phone] (home)  DOB: [birth year] MRN: [id number]  PRIMARY CARE PROVIDER:    Jani Gravel, MD,  [address] [place] [address] [phone]  REFERRING PROVIDER:   Jani Kemp, [place],  [place] [address] [place], with same practice  RESPONSIBLE PARTY:   Self/Cory Kemp Patient [phone] best to call Contact Information     Name Relation Home Work Mobile   Cory Kemp Daughter   [phone]   Cory Kemp   [phone]     TELEHEALTH VISIT STATEMENT Due to the COVID-19 crisis, this visit was done via telemedicine from my office and it was initiated and consent by this patient and or family. Video-audio (telehealth) contact was unable to be done due to technical barriers from the patients side. I connected with patient OR PROXY by a telephone  and verified that I am speaking with the correct person. I discussed the limitations of evaluation and management by telemedicine. The patient expressed understanding and agreed to proceed.  Palliative Care was asked to follow this patient by consultation request of  Cory Gravel, MD to address advance care planning, complex medical decision making and goals of care clarification. This is a follow up visit.    ASSESSMENT AND / RECOMMENDATIONS:   CODE STATUS: Patient elects to be a Partial code; wishes to have chest compressions and ACLS medications, does not want mechanical ventilation/life support.  Goals of Care: Goals include to maximize quality of life and symptom management. Family interested in hospice service when patient qualifies for it.  Visit consisted of counseling and education dealing with the complex and emotionally intense issues of symptom management and  palliative care in the setting of serious and potentially life-threatening illness.  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  He shared his strong spiritual values and practices which are his main anchor supporting him through his health challenges.  Therapeutic listening and ample emotional support provided. Palliative care team will continue to support patient, patient's family, and medical team. Symptom Management/Plan: Acute and chronic respiratory failure with hypoxia: Improved. Continue of 4 L/Min; acute respiratory failure with hypoxia related to pneumonia - hospitalized 12/4 -07/16/2021 for which he completed antibiotics and prednisone. Rest in between activities. Balance between rest and performance.  COPD: Managed with Albuterol, Trelegy, Roflumilast. Deep slow breathing, avoidance of triggers discussed reiterated. Patient stopped smoking 3 years ago. Continue with Pulmonologist as planned. Oxygen supplementation is ongoing. Follow up: Palliative care will continue to follow for complex medical decision making, advance care planning, and clarification of goals. Return 6 weeks or prn. Encouraged to call provider sooner with any concerns.   Family /Caregiver/Community Supports: Patient lives at home with a flat mate. His daughter helps to manage his medications. Strong family support system identified. [place]  HOSPICE ELIGIBILITY/DIAGNOSIS: TBD  Chief Complaint: Follow up visit  HISTORY OF PRESENT ILLNESS:  Cory Kemp is a 81 y.o. year old male  with multiple medical conditions including Acute and chronic respiratory failure with hypoxia: related to pneumonia for which he was hospitalized, 12/4 -07/16/2021. Patient t reports improvement and better management of his oxygen supplementation.   Patient denies shortness of breath, no pain/discomfort. History of  COPD, HNT, prediabetes.  History obtained  from review of EMR, discussion with primary team, caregiver, family and/or Cory Kemp.  Review and summarization of Epic records shows history from other than patient. Rest of 10 point ROS asked and negative.  I reviewed as needed, available labs, patient records, imaging, studies and related documents from the EMR.   ROS General: NAD EYES: denies vision changes ENMT: denies dysphagia Cardiovascular: denies chest pain/discomfort Pulmonary: Endorses occasional chronic cough, denies SOB during visit Abdomen: endorses good appetite, denies constipation/diarrhea GU: denies dysuria, urinary frequency MSK:  endorses weakness, ambulatory, no falls reported Skin: denies rashes or wounds Neurological: denies pain, denies insomnia Psych: Endorses positive mood Heme/lymph/immuno: denies bruises, abnormal bleeding    PAST MEDICAL HISTORY:  Active Ambulatory Problems    Diagnosis Date Noted   Diabetes mellitus ([facility]) 01/10/2012   Former smoker 03/27/2012   Leukopenia 01/29/2013   Monocytosis 01/29/2013   Normochromic anemia 01/29/2013   B12 deficiency 11/03/2013   COPD (chronic obstructive pulmonary disease) ([facility]) 01/11/2016   Tachycardia 04/11/2016   Essential hypertension 04/11/2016   OSA (obstructive sleep apnea) 06/14/2017   Abnormal finding on EKG 01/03/2018   Chronic respiratory failure with hypoxia ([facility]) 12/13/2018   CAP (community acquired pneumonia) 12/25/2018   Elevated d-dimer 12/25/2018   COPD with acute exacerbation ([facility]) 02/08/2019   Hypertensive urgency 02/08/2019   Medication management 03/05/2019   Elevated troponin 04/20/2020   PVC's (premature ventricular contractions) 04/20/2020   Pneumonia due to COVID-19 virus 04/20/2020   Acute on chronic respiratory failure with hypoxia ([facility]) 07/11/2021   BPH (benign prostatic hyperplasia) 01/31/2013   Chronic neck pain 07/11/2021   History of malignant neoplasm of prostate 07/11/2021   Oxygen dependent 07/11/2021   Pure  hypercholesterolemia 07/11/2021   SVT (supraventricular tachycardia) ([facility]) 07/12/2021   Resolved Ambulatory Problems    Diagnosis Date Noted   Inguinal hernia - right 01/10/2012   Incisional hernias - swiss-cheese-type periumbilical 70/17/7939   Femoral hernia, right 03/27/2012   Acute on chronic respiratory failure with hypoxia ([facility]) 12/25/2018   Acute exacerbation of chronic obstructive pulmonary disease (COPD) ([facility]) 02/11/2019   Hypoxia 04/19/2020   Acute respiratory disease due to COVID-19 virus 04/20/2020   Past Medical History:  Diagnosis Date   Alcohol abuse    Anemia    Arthritis    Asthma    BPH (benign prostatic hypertrophy)    Dementia (HCC)    Glucose intolerance (impaired glucose tolerance)    Hyperlipidemia    Hypertension    Osteopenia     SOCIAL HX:  Social History   Tobacco Use   Smoking status: Former    Packs/day: 1.00    Years: 63.00    Pack years: 63.00    Types: Cigarettes    Start date: 08/09/1955    Quit date: 08/08/2018    Years since quitting: 3.0   Smokeless tobacco: Never  Substance Use Topics   Alcohol use: No    Alcohol/week: 0.0 standard drinks     FAMILY HX:  Family History  Problem Relation Age of Onset   Lung cancer Father    Lung cancer Mother    Diabetes Brother       ALLERGIES:  Allergies  Allergen Reactions   Adhesive [Tape] Rash    No "PLASTIC" tape!!! Patient broke out in blisters!!   Tegaderm Alginate Ag Rope Rash      PERTINENT MEDICATIONS:  Outpatient Encounter Medications as of 08/25/2021  Medication Sig   albuterol (PROVENTIL) (2.5  MG/3ML) 0.083% nebulizer solution Take 3 mLs (2.5 mg total) by nebulization every 6 (six) hours as needed for wheezing or shortness of breath.   albuterol (VENTOLIN HFA) 108 (90 Base) MCG/ACT inhaler INHALE 2 PUFFS BY MOUTH EVERY 6 HOURS AS NEEDED FOR WHEEZING OR SHORTNESS OF BREATH (Patient taking differently: 2 puffs every 6 (six) hours as needed for shortness of breath or wheezing.)    ascorbic acid (VITAMIN C) 500 MG tablet Take 1 tablet (500 mg total) by mouth daily.   carvedilol (COREG) 12.5 MG tablet Take 1 tablet (12.5 mg total) by mouth 2 (two) times daily with a meal.   escitalopram (LEXAPRO) 20 MG tablet Take 20 mg by mouth daily.   fluticasone (FLONASE) 50 MCG/ACT nasal spray Place 2 sprays into both nostrils daily. Reduce to 1 spray each nostril daily after stopping Afrin   isosorbide mononitrate (IMDUR) 30 MG 24 hr tablet Take 1 tablet (30 mg total) by mouth daily.   losartan (COZAAR) 50 MG tablet Take 50 mg by mouth daily.   methimazole (TAPAZOLE) 5 MG tablet Take 1 tablet (5 mg total) by mouth 2 (two) times daily.   montelukast (SINGULAIR) 10 MG tablet Take 10 mg by mouth daily.   OXYGEN Inhale 4 L into the lungs continuous.   predniSONE (DELTASONE) 5 MG tablet Label  & dispense according to the schedule below. take  6 Pills PO for 3 days, 4 Pills PO for 3 days, 2 Pills PO for 3 days, 1/2 Pill  PO for 3 days then STOP. Total 50 pills.   Roflumilast 250 MCG TABS TAKE 1 TABLET BY MOUTH EVERY MORNING   simvastatin (ZOCOR) 40 MG tablet Take 40 mg by mouth every evening.   TRELEGY ELLIPTA 100-62.5-25 MCG/INH AEPB INHALE 1 PUFF BY MOUTH EVERY DAY (Patient taking differently: Inhale 1 puff into the lungs daily.)   No facility-administered encounter medications on file as of 08/25/2021.   I spent 45 minutes providing this consultation; time includes spent with patient/family, chart review and documentation. More than 50% of the time in this consultation was spent on care coordination.  Thank you for the opportunity to participate in the care of Mr. Beza.  The palliative care team will continue to follow. Please call our office at [phone] if we can be of additional assistance.   Note: Portions of this note were generated with Lobbyist. Dictation errors may occur despite best attempts at proofreading.  Teodoro Spray, NP

## 2021-09-02 DIAGNOSIS — J9612 Chronic respiratory failure with hypercapnia: Secondary | ICD-10-CM | POA: Diagnosis not present

## 2021-09-02 DIAGNOSIS — J441 Chronic obstructive pulmonary disease with (acute) exacerbation: Secondary | ICD-10-CM | POA: Diagnosis not present

## 2021-09-02 DIAGNOSIS — R69 Illness, unspecified: Secondary | ICD-10-CM | POA: Diagnosis not present

## 2021-09-02 DIAGNOSIS — N400 Enlarged prostate without lower urinary tract symptoms: Secondary | ICD-10-CM | POA: Diagnosis not present

## 2021-09-02 DIAGNOSIS — M1990 Unspecified osteoarthritis, unspecified site: Secondary | ICD-10-CM | POA: Diagnosis not present

## 2021-09-02 DIAGNOSIS — D649 Anemia, unspecified: Secondary | ICD-10-CM | POA: Diagnosis not present

## 2021-09-02 DIAGNOSIS — M8580 Other specified disorders of bone density and structure, unspecified site: Secondary | ICD-10-CM | POA: Diagnosis not present

## 2021-09-02 DIAGNOSIS — E119 Type 2 diabetes mellitus without complications: Secondary | ICD-10-CM | POA: Diagnosis not present

## 2021-09-02 DIAGNOSIS — I10 Essential (primary) hypertension: Secondary | ICD-10-CM | POA: Diagnosis not present

## 2021-09-02 DIAGNOSIS — E538 Deficiency of other specified B group vitamins: Secondary | ICD-10-CM | POA: Diagnosis not present

## 2021-09-07 ENCOUNTER — Encounter: Payer: Self-pay | Admitting: Cardiovascular Disease

## 2021-09-07 ENCOUNTER — Ambulatory Visit: Payer: Medicare HMO | Admitting: Cardiovascular Disease

## 2021-09-07 ENCOUNTER — Other Ambulatory Visit: Payer: Self-pay

## 2021-09-07 VITALS — BP 120/58 | HR 112 | Ht 73.0 in | Wt 206.0 lb

## 2021-09-07 DIAGNOSIS — I10 Essential (primary) hypertension: Secondary | ICD-10-CM | POA: Diagnosis not present

## 2021-09-07 DIAGNOSIS — J441 Chronic obstructive pulmonary disease with (acute) exacerbation: Secondary | ICD-10-CM | POA: Diagnosis not present

## 2021-09-07 DIAGNOSIS — J449 Chronic obstructive pulmonary disease, unspecified: Secondary | ICD-10-CM

## 2021-09-07 DIAGNOSIS — Z87891 Personal history of nicotine dependence: Secondary | ICD-10-CM

## 2021-09-07 DIAGNOSIS — I471 Supraventricular tachycardia: Secondary | ICD-10-CM

## 2021-09-07 DIAGNOSIS — E7800 Pure hypercholesterolemia, unspecified: Secondary | ICD-10-CM

## 2021-09-07 NOTE — Assessment & Plan Note (Signed)
History of essential hypertension blood pressure measured today at 120/58.  He is on carvedilol and losartan.

## 2021-09-07 NOTE — Patient Instructions (Signed)

## 2021-09-07 NOTE — Assessment & Plan Note (Signed)
Respiratory failure with exacerbation of COPD with apparently an episode of SVT.  There was no mention of atrial fibrillation.  He is in sinus rhythm/sinus tachycardia today.

## 2021-09-07 NOTE — Progress Notes (Signed)
09/07/2021 Cory Kemp   1941/06/26  [id number]  Primary Physician Cory Gravel, MD Primary Cardiologist: Cory Harp MD Cory Kemp, [place]  HPI:  Cory Kemp is a 81 y.o. mildly overweight widowed Caucasian male father of 2, grandfather of 4 grandchildren who is accompanied by his son Cory Kemp today.  He was referred by Cory Cookey, NP because of recent hospitalization with tachycardia.  He still works driving for [employer].  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  His risk factors include 60-pack-year tobacco abuse having quit 3 years ago.  He did drink remotely as well.  He has treated hypertension and hyperlipidemia.  There is no family history of heart disease.  He is never had a heart attack or stroke.  He denies chest pain.  He lives with a friend.  He does have oxygen dependent COPD along with mild dementia.  He was recently hospitalized for a COPD exacerbation from 07/11/2021 for 5 days.  There was a palliative care consult performed.   Current Meds  Medication Sig   albuterol (PROVENTIL) (2.5 MG/3ML) 0.083% nebulizer solution Take 3 mLs (2.5 mg total) by nebulization every 6 (six) hours as needed for wheezing or shortness of breath.   albuterol (VENTOLIN HFA) 108 (90 Base) MCG/ACT inhaler INHALE 2 PUFFS BY MOUTH EVERY 6 HOURS AS NEEDED FOR WHEEZING OR SHORTNESS OF BREATH (Patient taking differently: 2 puffs every 6 (six) hours as needed for shortness of breath or wheezing.)   ascorbic acid (VITAMIN C) 500 MG tablet Take 1 tablet (500 mg total) by mouth daily.   carvedilol (COREG) 12.5 MG tablet Take 1 tablet (12.5 mg total) by mouth 2 (two) times daily with a meal.   escitalopram (LEXAPRO) 20 MG tablet Take 20 mg by mouth daily.   fluticasone (FLONASE) 50 MCG/ACT nasal spray Place 2 sprays into both nostrils daily. Reduce to 1 spray each nostril daily after stopping Afrin   isosorbide mononitrate (IMDUR) 30 MG 24  hr tablet Take 1 tablet (30 mg total) by mouth daily.   losartan (COZAAR) 50 MG tablet Take 50 mg by mouth daily.   methimazole (TAPAZOLE) 5 MG tablet Take 1 tablet (5 mg total) by mouth 2 (two) times daily.   montelukast (SINGULAIR) 10 MG tablet Take 10 mg by mouth daily.   OXYGEN Inhale 4 L into the lungs continuous.   predniSONE (DELTASONE) 5 MG tablet Label  & dispense according to the schedule below. take  6 Pills PO for 3 days, 4 Pills PO for 3 days, 2 Pills PO for 3 days, 1/2 Pill  PO for 3 days then STOP. Total 50 pills.   Roflumilast 250 MCG TABS TAKE 1 TABLET BY MOUTH EVERY MORNING   simvastatin (ZOCOR) 40 MG tablet Take 40 mg by mouth every evening.   TRELEGY ELLIPTA 100-62.5-25 MCG/INH AEPB INHALE 1 PUFF BY MOUTH EVERY DAY (Patient taking differently: Inhale 1 puff into the lungs daily.)     Allergies  Allergen Reactions   Adhesive [Tape] Rash    No "PLASTIC" tape!!! Patient broke out in blisters!!   Tegaderm Alginate Ag Rope Rash    Social History   Socioeconomic History   Marital status: Widowed    Spouse name: Not on file   Number of children: 2   Years of education: 6   Highest education level: 12th grade  Occupational History   Occupation: Retired  Comment: [employer]  Tobacco Use   Smoking status: Former    Packs/day: 1.00    Years: 63.00    Pack years: 63.00    Types: Cigarettes    Start date: 08/09/1955    Quit date: 08/08/2018    Years since quitting: 3.0   Smokeless tobacco: Never  Vaping Use   Vaping Use: Never used  Substance and Sexual Activity   Alcohol use: No    Alcohol/week: 0.0 standard drinks   Drug use: No   Sexual activity: Not Currently  Other Topics Concern   Not on file  Social History Narrative   Not on file   Social Determinants of Health   Financial Resource Strain: Not on file  Food Insecurity: No Food Insecurity   Worried About Charity fundraiser in the Last Year: Never true   Ran Out of Food in the Last Year: Never true   Transportation Needs: No Transportation Needs   Lack of Transportation (Medical): No   Lack of Transportation (Non-Medical): No  Physical Activity: Not on file  Stress: Not on file  Social Connections: Not on file  Intimate Partner Violence: Not on file     Review of Systems: General: negative for chills, fever, night sweats or weight changes.  Cardiovascular: negative for chest pain, dyspnea on exertion, edema, orthopnea, palpitations, paroxysmal nocturnal dyspnea or shortness of breath Dermatological: negative for rash Respiratory: negative for cough or wheezing Urologic: negative for hematuria Abdominal: negative for nausea, vomiting, diarrhea, bright red blood per rectum, melena, or hematemesis Neurologic: negative for visual changes, syncope, or dizziness All other systems reviewed and are otherwise negative except as noted above.    Blood pressure (!) 120/58, pulse (!) 112, height 6\' 1"  (1.854 m), weight 206 lb (93.4 kg), SpO2 91 %.  General appearance: alert and no distress Neck: no adenopathy, no carotid bruit, no JVD, supple, symmetrical, trachea midline, and thyroid not enlarged, symmetric, no tenderness/mass/nodules Lungs: clear to auscultation bilaterally Heart: regular rate and rhythm, S1, S2 normal, no murmur, click, rub or gallop Extremities: extremities normal, atraumatic, no cyanosis or edema Pulses: 2+ and symmetric Skin: Skin color, texture, turgor normal. No rashes or lesions Neurologic: Grossly normal  EKG sinus tachycardia at 112 without ST or T wave changes.  Personally reviewed this EKG.  ASSESSMENT AND PLAN:   Former smoker History of discontinue tobacco use having smoked 60 pack years and quit 3 years ago.  He does have oxygen dependent COPD.  Essential hypertension History of essential hypertension blood pressure measured today at 120/58.  He is on carvedilol and losartan.  Pure hypercholesterolemia History of hyperlipidemia on statin therapy  followed by his PCP his most recent lipid profile performed 10/13/2020 revealed total cholesterol of 131, LDL 73 and HDL 47.  SVT (supraventricular tachycardia) (HCC) Respiratory failure with exacerbation of COPD with apparently an episode of SVT.  There was no mention of atrial fibrillation.  He is in sinus rhythm/sinus tachycardia today.     Cory Harp MD FACP,FACC,FAHA, [facility] 09/07/2021 3:06 PM

## 2021-09-07 NOTE — Assessment & Plan Note (Signed)
History of discontinue tobacco use having smoked 60 pack years and quit 3 years ago.  He does have oxygen dependent COPD.

## 2021-09-07 NOTE — Assessment & Plan Note (Signed)
History of hyperlipidemia on statin therapy followed by his PCP his most recent lipid profile performed 10/13/2020 revealed total cholesterol of 131, LDL 73 and HDL 47.

## 2021-09-17 DIAGNOSIS — J9612 Chronic respiratory failure with hypercapnia: Secondary | ICD-10-CM | POA: Diagnosis not present

## 2021-09-17 DIAGNOSIS — I10 Essential (primary) hypertension: Secondary | ICD-10-CM | POA: Diagnosis not present

## 2021-09-17 DIAGNOSIS — N400 Enlarged prostate without lower urinary tract symptoms: Secondary | ICD-10-CM | POA: Diagnosis not present

## 2021-09-17 DIAGNOSIS — M8580 Other specified disorders of bone density and structure, unspecified site: Secondary | ICD-10-CM | POA: Diagnosis not present

## 2021-09-17 DIAGNOSIS — J441 Chronic obstructive pulmonary disease with (acute) exacerbation: Secondary | ICD-10-CM | POA: Diagnosis not present

## 2021-09-17 DIAGNOSIS — D649 Anemia, unspecified: Secondary | ICD-10-CM | POA: Diagnosis not present

## 2021-09-17 DIAGNOSIS — E538 Deficiency of other specified B group vitamins: Secondary | ICD-10-CM | POA: Diagnosis not present

## 2021-09-17 DIAGNOSIS — R69 Illness, unspecified: Secondary | ICD-10-CM | POA: Diagnosis not present

## 2021-09-17 DIAGNOSIS — M1990 Unspecified osteoarthritis, unspecified site: Secondary | ICD-10-CM | POA: Diagnosis not present

## 2021-09-17 DIAGNOSIS — E119 Type 2 diabetes mellitus without complications: Secondary | ICD-10-CM | POA: Diagnosis not present

## 2021-09-19 DIAGNOSIS — J441 Chronic obstructive pulmonary disease with (acute) exacerbation: Secondary | ICD-10-CM | POA: Diagnosis not present

## 2021-09-26 ENCOUNTER — Other Ambulatory Visit: Payer: Self-pay | Admitting: Pulmonary Disease

## 2021-10-05 DIAGNOSIS — J441 Chronic obstructive pulmonary disease with (acute) exacerbation: Secondary | ICD-10-CM | POA: Diagnosis not present

## 2021-10-05 DIAGNOSIS — J449 Chronic obstructive pulmonary disease, unspecified: Secondary | ICD-10-CM | POA: Diagnosis not present

## 2021-10-08 ENCOUNTER — Telehealth: Payer: Self-pay | Admitting: Pulmonary Disease

## 2021-10-08 NOTE — Telephone Encounter (Signed)
The pt needs to be offered sooner appt with any provider if having increased SOB  ? ?Can refill med but ins most likely will not cover iff too soon to refill  ? ?LMTCB for [name] ?

## 2021-10-08 NOTE — Telephone Encounter (Signed)
Lm for [name](DPR) ?

## 2021-10-11 ENCOUNTER — Other Ambulatory Visit: Payer: Self-pay

## 2021-10-11 NOTE — Telephone Encounter (Signed)
Tried calling again and still no answer- LMTCB and will close per protocol ?

## 2021-10-12 ENCOUNTER — Telehealth: Payer: Self-pay | Admitting: Pulmonary Disease

## 2021-10-13 NOTE — Telephone Encounter (Signed)
Attempted to call  [name] but unable to reach. Left message for her to return call. ?

## 2021-10-18 DIAGNOSIS — E7800 Pure hypercholesterolemia, unspecified: Secondary | ICD-10-CM | POA: Diagnosis not present

## 2021-10-18 DIAGNOSIS — R7303 Prediabetes: Secondary | ICD-10-CM | POA: Diagnosis not present

## 2021-10-18 DIAGNOSIS — Z8546 Personal history of malignant neoplasm of prostate: Secondary | ICD-10-CM | POA: Diagnosis not present

## 2021-10-18 DIAGNOSIS — Z125 Encounter for screening for malignant neoplasm of prostate: Secondary | ICD-10-CM | POA: Diagnosis not present

## 2021-10-25 DIAGNOSIS — Z0000 Encounter for general adult medical examination without abnormal findings: Secondary | ICD-10-CM | POA: Diagnosis not present

## 2021-10-25 DIAGNOSIS — J432 Centrilobular emphysema: Secondary | ICD-10-CM | POA: Diagnosis not present

## 2021-10-25 DIAGNOSIS — R7303 Prediabetes: Secondary | ICD-10-CM | POA: Diagnosis not present

## 2021-10-25 DIAGNOSIS — E7800 Pure hypercholesterolemia, unspecified: Secondary | ICD-10-CM | POA: Diagnosis not present

## 2021-10-25 DIAGNOSIS — I10 Essential (primary) hypertension: Secondary | ICD-10-CM | POA: Diagnosis not present

## 2021-10-25 DIAGNOSIS — Z9981 Dependence on supplemental oxygen: Secondary | ICD-10-CM | POA: Diagnosis not present

## 2021-10-25 DIAGNOSIS — J9620 Acute and chronic respiratory failure, unspecified whether with hypoxia or hypercapnia: Secondary | ICD-10-CM | POA: Diagnosis not present

## 2021-10-25 DIAGNOSIS — Z8546 Personal history of malignant neoplasm of prostate: Secondary | ICD-10-CM | POA: Diagnosis not present

## 2021-10-27 ENCOUNTER — Other Ambulatory Visit: Payer: Self-pay

## 2021-10-27 ENCOUNTER — Encounter: Payer: Self-pay | Admitting: Pulmonary Disease

## 2021-10-27 ENCOUNTER — Ambulatory Visit: Payer: Medicare HMO | Admitting: Pulmonary Disease

## 2021-10-27 VITALS — BP 136/68 | HR 90 | Temp 97.7°F | Ht 72.0 in | Wt 208.4 lb

## 2021-10-27 DIAGNOSIS — R0600 Dyspnea, unspecified: Secondary | ICD-10-CM

## 2021-10-27 DIAGNOSIS — J9621 Acute and chronic respiratory failure with hypoxia: Secondary | ICD-10-CM

## 2021-10-27 DIAGNOSIS — Z9981 Dependence on supplemental oxygen: Secondary | ICD-10-CM | POA: Diagnosis not present

## 2021-10-27 DIAGNOSIS — J449 Chronic obstructive pulmonary disease, unspecified: Secondary | ICD-10-CM | POA: Diagnosis not present

## 2021-10-27 MED ORDER — ARFORMOTEROL TARTRATE 15 MCG/2ML IN NEBU: 15.0000 ug | INHALATION_SOLUTION | Freq: Two times a day (BID) | RESPIRATORY_TRACT | 6 refills | Status: DC

## 2021-10-27 MED ORDER — BUDESONIDE 0.5 MG/2ML IN SUSP: 0.5000 mg | Freq: Every day | RESPIRATORY_TRACT | 5 refills | Status: DC

## 2021-10-27 MED ORDER — PREDNISONE 10 MG PO TABS: 10.0000 mg | ORAL_TABLET | Freq: Every day | ORAL | 3 refills | Status: DC

## 2021-10-27 MED ORDER — ALBUTEROL SULFATE HFA 108 (90 BASE) MCG/ACT IN AERS: INHALATION_SPRAY | RESPIRATORY_TRACT | 5 refills | Status: DC

## 2021-10-27 MED ORDER — YUPELRI 175 MCG/3ML IN SOLN
175.0000 ug | Freq: Every day | RESPIRATORY_TRACT | 5 refills | Status: DC
Start: 2021-10-27 — End: 2022-06-12

## 2021-10-27 NOTE — Patient Instructions (Signed)
We will renew the albuterol rescue inhaler ?Stop the Trelegy ?Start Brovana, Pulmicort and Yupelri nebs ?Start prednisone 10 mg a day ?Follow-up in 3 months ?

## 2021-10-27 NOTE — Progress Notes (Signed)
? ?      ?[name]    [id number]    Apr 21, 1941 ? ?Primary Kemp Physician:Kim, Jeneen Rinks, MD ? ?Referring Physician: Jani Gravel, MD ?[address] ?[address] ?[place],  [place] [address] ? ?Problem list: ?COPD GOLD D ?Asthmatic bronchitis ?  ?HPI: ?[name] is a 81 year old chief complaint of dyspnea on exertion for the past 2 years. This is associated with cough, white mucus production, chest congestion, wheezing. He is a current 1 pack per day smoker. He is on albuterol which helps with her symptoms. He had been tried on various inhalers including Advair, Breo and Spiriva which did not help. ?Currently maintained on Trelegy and Daliresp. ? ?Interim history: ?He is back in my clinic after gap of 3 years.  Last seen by Dr. Melvyn Novas in 2022. ?He continues to have intermittent flares up of COPD.  Continues on the Trelegy inhaler but notes worsening dyspnea.  He is using his rescue inhaler frequently and is running out of prescription. ? ?Quit smoking in 2020. ? ?Outpatient Encounter Medications as of 10/27/2021  ?Medication Sig  ? albuterol (PROVENTIL) (2.5 MG/3ML) 0.083% nebulizer solution Take 3 mLs (2.5 mg total) by nebulization every 6 (six) hours as needed for wheezing or shortness of breath.  ? arformoterol (BROVANA) 15 MCG/2ML NEBU Take 2 mLs (15 mcg total) by nebulization 2 (two) times daily.  ? ascorbic acid (VITAMIN C) 500 MG tablet Take 1 tablet (500 mg total) by mouth daily.  ? budesonide (PULMICORT) 0.5 MG/2ML nebulizer solution Take 2 mLs (0.5 mg total) by nebulization daily.  ? carvedilol (COREG) 12.5 MG tablet Take 1 tablet (12.5 mg total) by mouth 2 (two) times daily with a meal.  ? escitalopram (LEXAPRO) 20 MG tablet Take 20 mg by mouth daily.  ? fluticasone (FLONASE) 50 MCG/ACT nasal spray Place 2 sprays into both nostrils daily. Reduce to 1 spray each nostril daily after stopping Afrin  ? isosorbide mononitrate (IMDUR) 30 MG 24 hr tablet Take 1 tablet (30 mg total) by mouth daily.  ? losartan  (COZAAR) 50 MG tablet Take 50 mg by mouth daily.  ? methimazole (TAPAZOLE) 5 MG tablet Take 1 tablet (5 mg total) by mouth 2 (two) times daily.  ? montelukast (SINGULAIR) 10 MG tablet Take 10 mg by mouth daily.  ? OXYGEN Inhale 4 L into the lungs continuous.  ? predniSONE (DELTASONE) 10 MG tablet Take 1 tablet (10 mg total) by mouth daily with breakfast.  ? revefenacin (YUPELRI) 175 MCG/3ML nebulizer solution Take 3 mLs (175 mcg total) by nebulization daily.  ? Roflumilast 250 MCG TABS TAKE 1 TABLET BY MOUTH EVERY MORNING  ? simvastatin (ZOCOR) 40 MG tablet Take 40 mg by mouth every evening.  ? [DISCONTINUED] predniSONE (DELTASONE) 5 MG tablet Label  & dispense according to the schedule below. take  6 Pills PO for 3 days, 4 Pills PO for 3 days, 2 Pills PO for 3 days, 1/2 Pill  PO for 3 days then STOP. Total 50 pills.  ? [DISCONTINUED] TRELEGY ELLIPTA 100-62.5-25 MCG/INH AEPB INHALE 1 PUFF BY MOUTH EVERY DAY (Patient taking differently: Inhale 1 puff into the lungs daily.)  ? albuterol (VENTOLIN HFA) 108 (90 Base) MCG/ACT inhaler INHALE 2 PUFFS BY MOUTH EVERY 6 HOURS AS NEEDED FOR WHEEZING OR SHORTNESS OF BREATH  ? [DISCONTINUED] albuterol (VENTOLIN HFA) 108 (90 Base) MCG/ACT inhaler INHALE 2 PUFFS BY MOUTH EVERY 6 HOURS AS NEEDED FOR WHEEZING OR SHORTNESS OF BREATH (Patient not taking: Reported on 10/27/2021)  ? ?  No facility-administered encounter medications on file as of 10/27/2021.  ? ? ?Physical Exam: ?Gen:      No acute distress ?HEENT:  EOMI, sclera anicteric ?Neck:     No masses; no thyromegaly ?Lungs:    Distant heart sounds ?CV:         Regular rate and rhythm; no murmurs ?Abd:      + bowel sounds; soft, non-tender; no palpable masses, no distension ?Ext:    No edema; adequate peripheral perfusion ?Skin:      Warm and dry; no rash ?Neuro: alert and oriented x 3 ?Psych: normal mood and affect  ? ?Data Reviewed: ?Imaging: ?CT chest 09/12/14- Emphysematous changes noted bilaterally. Minimal posterior basilar  scarring or subsegmental atelectasis is noted. No pulmonary mass or nodule is noted ?CT angiogram 04/10/16- emphysema, chronic bronchitis.  Subsegmental atelectasis at the bases mildly increased compared to previous ?CTA chest 12/25/2018- no evidence of acute PE, emphysema with left greater than right lower lobe pneumonia, small layering left pleural effusion, mild reactive hilar lymph nodes ?CTA chest 02/11/2019- no definite large central PE, moderate to severe emphysema with bullous changes of the right lung base, 9 mm focal nodularity right middle lobe appear similar to prior CT and may represent scarring ?CTA 07/11/2021-bilateral lower lobe scarring/bronchitic changes.  Emphysema with several large bulla.  Enlarged pulmonary artery. ?I have reviewed the images personally. ?  ?PFTs: ?01/21/16 ?FVC 2.44 (71%), FEV1 1.47 [41%), F/F 43, TLC 122%, RV/TLC 166%, DLCO 48% ?Severe obstruction with reduction in diffusion capacity. Hyperinflation with air trapping. ?  ?Sleep: ?Home sleep test 11/14/16 ?Moderate OSA with desats. ?  ?In lab sleep study 12/16/2017 ?AHI 0.8, nocturnal desaturations. ? ?Assessment:  ?COPD  ?He has worsening dyspnea on exertion over the years.  Previously maintained on Daliresp but now off due to side effects of diarrhea ?He was also on chronic prednisone at 10 mg but this has been stopped at some point.  I will reinitiate. ?I am not sure if he is getting enough medications and with the Trelegy inhaler.  I will stop this and start him on Brovana, Pulmicort and Yupelri nebs ?Renew albuterol rescue inhaler ?Continue supplemental oxygen ?Follow-up in 3 months ? ?Nocturnal hypoxia ?Sleep study reviewed which did not show any sleep apnea.  He does have nocturnal desats and is started on supplemental oxygen ? ?Plan/Recommendations: ?Stop Trelegy.  Start Brovana, Pulmicort, Yupelri nebs ?Prednisone 10 mg a day ? ? ?[name] MD ?Cory Kemp ?10/28/2021, 2:50 PM ? ?CC: Jani Gravel,  MD ? ?

## 2021-10-28 ENCOUNTER — Telehealth: Payer: Self-pay

## 2021-10-28 ENCOUNTER — Other Ambulatory Visit (HOSPITAL_COMMUNITY): Payer: Self-pay

## 2021-10-28 ENCOUNTER — Encounter: Payer: Self-pay | Admitting: Pulmonary Disease

## 2021-10-28 NOTE — Telephone Encounter (Signed)
Patient Advocate Encounter ?  ?Received notification from [facility] that prior authorization for Yupelri 162mg/3ml solution is required by his/her insurance [employer]. ?  ?PA submitted on 10/28/21 ? ?Key#: [id number] ? ?Status is pending ?   ?[facility] will continue to follow: ? ?Patient Advocate ?Fax: [phone] ?

## 2021-10-28 NOTE — Telephone Encounter (Signed)
Patient Advocate Encounter ? ?Prior Authorization for Yupelri 15mg/3ml solution has been approved.   ? ?PA# [id number]? ?Effective dates: 10/28/21 through 10/29/22 ? ?Per Test Claim Patients co-pay is $281.09.  ? ?Spoke with Pharmacy to Process. ? ?Patient Advocate ?Fax: [phone] ?

## 2021-11-03 ENCOUNTER — Other Ambulatory Visit (HOSPITAL_COMMUNITY): Payer: Self-pay

## 2021-11-05 DIAGNOSIS — J449 Chronic obstructive pulmonary disease, unspecified: Secondary | ICD-10-CM | POA: Diagnosis not present

## 2021-11-05 DIAGNOSIS — J441 Chronic obstructive pulmonary disease with (acute) exacerbation: Secondary | ICD-10-CM | POA: Diagnosis not present

## 2021-11-22 ENCOUNTER — Other Ambulatory Visit (HOSPITAL_COMMUNITY): Payer: Self-pay

## 2021-12-05 DIAGNOSIS — J441 Chronic obstructive pulmonary disease with (acute) exacerbation: Secondary | ICD-10-CM | POA: Diagnosis not present

## 2021-12-05 DIAGNOSIS — J449 Chronic obstructive pulmonary disease, unspecified: Secondary | ICD-10-CM | POA: Diagnosis not present

## 2021-12-13 ENCOUNTER — Other Ambulatory Visit: Payer: Medicare Other | Admitting: Hospice

## 2021-12-13 DIAGNOSIS — J9611 Chronic respiratory failure with hypoxia: Secondary | ICD-10-CM

## 2021-12-13 DIAGNOSIS — J449 Chronic obstructive pulmonary disease, unspecified: Secondary | ICD-10-CM | POA: Diagnosis not present

## 2021-12-13 DIAGNOSIS — Z515 Encounter for palliative care: Secondary | ICD-10-CM

## 2021-12-13 NOTE — Progress Notes (Signed)
? ? ?Manufacturing engineer ?Community Palliative Care Consult Note ?Telephone: [phone]  ?Fax: [phone] ? ?PATIENT NAME: Cory Kemp ?[place] [address] ?[phone] (home)  ?DOB: Aug 30, 1940 ?MRN: [id number] ? ?PRIMARY CARE PROVIDER:    ?Cory Gravel, MD,  ?[address] ?[place] [address] ?[phone] ? ?REFERRING PROVIDER:   ?Cory Gravel, MD ?[address] ?[address] ?[place],  [place] [address] ?[phone] ?Cory Pilling PA, with same practice ? ?RESPONSIBLE PARTY:   Self/Cory Kemp ?Patient [phone] best to call ?Contact Information   ? ? Name Relation Home Work Mobile  ? Cory Kemp Daughter   [phone]  ? Cory Kemp   [phone]  ? ?  ?TELEHEALTH VISIT STATEMENT ?Due to the COVID-19 crisis, this visit was done via telemedicine from my office and it was initiated and consent by this patient and or family. Video-audio (telehealth) contact was unable to be done due to technical barriers from the patient?s side. ?I connected with patient OR PROXY by a telephone  and verified that I am speaking with the correct person. I discussed the limitations of evaluation and management by telemedicine. The patient expressed understanding and agreed to proceed.  ?Palliative Care was asked to follow this patient by consultation request of  Cory Gravel, MD to address advance care planning, complex medical decision making and goals of care clarification.  NP called Cory Kemp and left her a voicemail with callback number.  This is a follow up visit. ? ?  ASSESSMENT AND / RECOMMENDATIONS:  ? ?CODE STATUS: Patient is a partial code; wishes to have chest compressions and ACLS medications, does not want mechanical ventilation/life support. ? ?Goals of Care: Goals include to maximize quality of life and symptom management. Family interested in hospice service when patient qualifies for it.  ?Visit consisted of counseling and education dealing with the complex and  emotionally intense issues of symptom management and palliative care in the setting of serious and potentially life-threatening illness.  Patient shared he lived a full life with his earlier work as Retail banker and his travels [place].  He lives his life with gratitude for every breath he gets.  ? ?He continues with his strong spiritual values and practices which are his main anchor supporting him through his health challenges.  Therapeutic listening and ample emotional support provided. ?Palliative care team will continue to support patient, patient's family, and medical team. ?Symptom Management/Plan: ?Chronic respiratory failure with hypoxia: dyspnea on mild to moderate exertion, related to COPD. Continue of 4 L/Min;  Rest in between activities. Balance between rest and performance.  ?COPD: Managed with Albuterol, Trelegy, and Daliresp (Roflumilast). Deep slow breathing, avoidance of triggers discussed reiterated. Oxygen supplementation is ongoing. Patient stopped smoking > 3 years ago. Continue with Pulmonologist as planned.  ?Follow up: Palliative care will continue to follow for complex medical decision making, advance care planning, and clarification of goals. Return 6 weeks or prn. Encouraged to call provider sooner with any concerns.  ? ?Family /Caregiver/Community Supports: Patient lives at home with a flat mate. His daughter helps to manage his medications. Strong family support system identified.  ?HOSPICE ELIGIBILITY/DIAGNOSIS: TBD ? ?Chief Complaint: Follow up visit ? ?HISTORY OF PRESENT ILLNESS:  Cory Kemp is a 81 y.o. year old male  with multiple medical conditions including  chronic respiratory failure with hypoxia, COPD, HNT, prediabetes.  ?History obtained from review of EMR, discussion  with primary team, caregiver, family and/or Cory Kemp.  ?Review and summarization of Epic records shows history from other than patient. Rest of 10  point ROS asked and negative.  ?I reviewed as needed, available labs, patient records, imaging, studies and related documents from the EMR. ? ? ?ROS ?General: NAD ?EYES: denies vision changes ?ENMT: denies dysphagia ?Cardiovascular: denies chest pain/discomfort ?Pulmonary: Endorses SOB on exertion, no cough ?Abdomen: endorses good appetite, denies constipation/diarrhea ?GU: denies dysuria, urinary frequency ?MSK:  endorses weakness, ambulatory, no falls reported ?Skin: denies rashes or wounds ?Neurological: denies pain, denies insomnia ?Psych: Endorses positive mood ?Heme/lymph/immuno: denies bruises, abnormal bleeding ? ? ? ?PAST MEDICAL HISTORY:  ?Active Ambulatory Problems  ?  Diagnosis Date Noted  ? Diabetes mellitus ([place]) 01/10/2012  ? Former smoker 03/27/2012  ? Leukopenia 01/29/2013  ? Monocytosis 01/29/2013  ? Normochromic anemia 01/29/2013  ? B12 deficiency 11/03/2013  ? COPD (chronic obstructive pulmonary disease) ([place]) 01/11/2016  ? Tachycardia 04/11/2016  ? Essential hypertension 04/11/2016  ? OSA (obstructive sleep apnea) 06/14/2017  ? Abnormal finding on EKG 01/03/2018  ? Chronic respiratory failure with hypoxia ([place]) 12/13/2018  ? CAP (community acquired pneumonia) 12/25/2018  ? Elevated d-dimer 12/25/2018  ? COPD with acute exacerbation ([place]) 02/08/2019  ? Hypertensive urgency 02/08/2019  ? Medication management 03/05/2019  ? Elevated troponin 04/20/2020  ? PVC's (premature ventricular contractions) 04/20/2020  ? Pneumonia due to COVID-19 virus 04/20/2020  ? Acute on chronic respiratory failure with hypoxia ([place]) 07/11/2021  ? BPH (benign prostatic hyperplasia) 01/31/2013  ? Chronic neck pain 07/11/2021  ? History of malignant neoplasm of prostate 07/11/2021  ? Oxygen dependent 07/11/2021  ? Pure hypercholesterolemia 07/11/2021  ? SVT (supraventricular tachycardia) ([place]) 07/12/2021  ? ?Resolved Ambulatory Problems  ?  Diagnosis Date Noted  ? Inguinal hernia - right 01/10/2012  ? Incisional hernias -  swiss-cheese-type periumbilical 99/35/7017  ? Femoral hernia, right 03/27/2012  ? Acute on chronic respiratory failure with hypoxia ([place]) 12/25/2018  ? Acute exacerbation of chronic obstructive pulmonary disease (COPD) ([place]) 02/11/2019  ? Hypoxia 04/19/2020  ? Acute respiratory disease due to COVID-19 virus 04/20/2020  ? ?Past Medical History:  ?Diagnosis Date  ? Alcohol abuse   ? Anemia   ? Arthritis   ? Asthma   ? BPH (benign prostatic hypertrophy)   ? Dementia ([place])   ? Glucose intolerance (impaired glucose tolerance)   ? Hyperlipidemia   ? Hypertension   ? Osteopenia   ? ? ?SOCIAL HX:  ?Social History  ? ?Tobacco Use  ? Smoking status: Former  ?  Packs/day: 1.00  ?  Years: 63.00  ?  Pack years: 63.00  ?  Types: Cigarettes  ?  Start date: 08/09/1955  ?  Quit date: 08/08/2018  ?  Years since quitting: 3.3  ? Smokeless tobacco: Never  ?Substance Use Topics  ? Alcohol use: No  ?  Alcohol/week: 0.0 standard drinks  ? ?  ?FAMILY HX:  ?Family History  ?Problem Relation Age of Onset  ? Lung cancer Father   ? Lung cancer Mother   ? Diabetes Brother   ?   ? ?ALLERGIES:  ?Allergies  ?Allergen Reactions  ? Adhesive [Tape] Rash  ?  No "PLASTIC" tape!!! Patient broke out in blisters!!  ? Tegaderm Alginate Ag Rope Rash  ?   ? ?PERTINENT MEDICATIONS:  ?Outpatient Encounter Medications as of 12/13/2021  ?Medication Sig  ? albuterol (PROVENTIL) (2.5 MG/3ML) 0.083% nebulizer solution Take 3 mLs (2.5 mg total) by nebulization every  6 (six) hours as needed for wheezing or shortness of breath.  ? albuterol (VENTOLIN HFA) 108 (90 Base) MCG/ACT inhaler INHALE 2 PUFFS BY MOUTH EVERY 6 HOURS AS NEEDED FOR WHEEZING OR SHORTNESS OF BREATH  ? arformoterol (BROVANA) 15 MCG/2ML NEBU Take 2 mLs (15 mcg total) by nebulization 2 (two) times daily.  ? ascorbic acid (VITAMIN C) 500 MG tablet Take 1 tablet (500 mg total) by mouth daily.  ? budesonide (PULMICORT) 0.5 MG/2ML nebulizer solution Take 2 mLs (0.5 mg total) by nebulization daily.  ? carvedilol  (COREG) 12.5 MG tablet Take 1 tablet (12.5 mg total) by mouth 2 (two) times daily with a meal.  ? escitalopram (LEXAPRO) 20 MG tablet Take 20 mg by mouth daily.  ? fluticasone (FLONASE) 50 MCG/ACT nasal spra

## 2022-01-05 DIAGNOSIS — J449 Chronic obstructive pulmonary disease, unspecified: Secondary | ICD-10-CM | POA: Diagnosis not present

## 2022-01-05 DIAGNOSIS — J441 Chronic obstructive pulmonary disease with (acute) exacerbation: Secondary | ICD-10-CM | POA: Diagnosis not present

## 2022-01-06 ENCOUNTER — Other Ambulatory Visit: Payer: Self-pay

## 2022-01-06 ENCOUNTER — Inpatient Hospital Stay (HOSPITAL_COMMUNITY)
Admission: EM | Admit: 2022-01-06 | Discharge: 2022-01-08 | DRG: 190 | Disposition: A | Discharge status: Home Health | Payer: Medicare HMO | Attending: Internal Medicine | Admitting: Internal Medicine

## 2022-01-06 ENCOUNTER — Emergency Department (HOSPITAL_COMMUNITY): Payer: Medicare HMO

## 2022-01-06 ENCOUNTER — Encounter (HOSPITAL_COMMUNITY): Payer: Self-pay | Admitting: Emergency Medicine

## 2022-01-06 DIAGNOSIS — J439 Emphysema, unspecified: Secondary | ICD-10-CM | POA: Diagnosis not present

## 2022-01-06 DIAGNOSIS — N400 Enlarged prostate without lower urinary tract symptoms: Secondary | ICD-10-CM | POA: Diagnosis present

## 2022-01-06 DIAGNOSIS — E119 Type 2 diabetes mellitus without complications: Secondary | ICD-10-CM | POA: Diagnosis not present

## 2022-01-06 DIAGNOSIS — I10 Essential (primary) hypertension: Secondary | ICD-10-CM | POA: Diagnosis not present

## 2022-01-06 DIAGNOSIS — F1010 Alcohol abuse, uncomplicated: Secondary | ICD-10-CM | POA: Diagnosis present

## 2022-01-06 DIAGNOSIS — I471 Supraventricular tachycardia, unspecified: Secondary | ICD-10-CM

## 2022-01-06 DIAGNOSIS — J9621 Acute and chronic respiratory failure with hypoxia: Secondary | ICD-10-CM | POA: Diagnosis not present

## 2022-01-06 DIAGNOSIS — Z888 Allergy status to other drugs, medicaments and biological substances status: Secondary | ICD-10-CM

## 2022-01-06 DIAGNOSIS — E0590 Thyrotoxicosis, unspecified without thyrotoxic crisis or storm: Secondary | ICD-10-CM | POA: Diagnosis present

## 2022-01-06 DIAGNOSIS — D72819 Decreased white blood cell count, unspecified: Secondary | ICD-10-CM | POA: Diagnosis present

## 2022-01-06 DIAGNOSIS — Z7952 Long term (current) use of systemic steroids: Secondary | ICD-10-CM

## 2022-01-06 DIAGNOSIS — D649 Anemia, unspecified: Secondary | ICD-10-CM | POA: Diagnosis present

## 2022-01-06 DIAGNOSIS — Z91048 Other nonmedicinal substance allergy status: Secondary | ICD-10-CM

## 2022-01-06 DIAGNOSIS — Z9981 Dependence on supplemental oxygen: Secondary | ICD-10-CM

## 2022-01-06 DIAGNOSIS — Z801 Family history of malignant neoplasm of trachea, bronchus and lung: Secondary | ICD-10-CM

## 2022-01-06 DIAGNOSIS — J9601 Acute respiratory failure with hypoxia: Secondary | ICD-10-CM | POA: Diagnosis not present

## 2022-01-06 DIAGNOSIS — E785 Hyperlipidemia, unspecified: Secondary | ICD-10-CM | POA: Diagnosis present

## 2022-01-06 DIAGNOSIS — Z79899 Other long term (current) drug therapy: Secondary | ICD-10-CM

## 2022-01-06 DIAGNOSIS — Z87891 Personal history of nicotine dependence: Secondary | ICD-10-CM

## 2022-01-06 DIAGNOSIS — M1990 Unspecified osteoarthritis, unspecified site: Secondary | ICD-10-CM | POA: Diagnosis present

## 2022-01-06 DIAGNOSIS — R000 Tachycardia, unspecified: Secondary | ICD-10-CM | POA: Diagnosis not present

## 2022-01-06 DIAGNOSIS — D696 Thrombocytopenia, unspecified: Secondary | ICD-10-CM | POA: Diagnosis present

## 2022-01-06 DIAGNOSIS — J441 Chronic obstructive pulmonary disease with (acute) exacerbation: Principal | ICD-10-CM

## 2022-01-06 DIAGNOSIS — I110 Hypertensive heart disease with heart failure: Secondary | ICD-10-CM | POA: Diagnosis present

## 2022-01-06 DIAGNOSIS — Z7951 Long term (current) use of inhaled steroids: Secondary | ICD-10-CM

## 2022-01-06 DIAGNOSIS — R0902 Hypoxemia: Secondary | ICD-10-CM

## 2022-01-06 DIAGNOSIS — I5023 Acute on chronic systolic (congestive) heart failure: Secondary | ICD-10-CM | POA: Diagnosis present

## 2022-01-06 DIAGNOSIS — J9611 Chronic respiratory failure with hypoxia: Secondary | ICD-10-CM

## 2022-01-06 DIAGNOSIS — R0602 Shortness of breath: Secondary | ICD-10-CM | POA: Diagnosis not present

## 2022-01-06 LAB — CBC WITH DIFFERENTIAL/PLATELET
Abs Immature Granulocytes: 0.16 10*3/uL — ABNORMAL HIGH (ref 0.00–0.07)
Basophils Absolute: 0 10*3/uL (ref 0.0–0.1)
Basophils Relative: 0 %
Eosinophils Absolute: 0 10*3/uL (ref 0.0–0.5)
Eosinophils Relative: 0 %
HCT: 44.5 % (ref 39.0–52.0)
Hemoglobin: 14.1 g/dL (ref 13.0–17.0)
Immature Granulocytes: 3 %
Lymphocytes Relative: 14 %
Lymphs Abs: 0.7 10*3/uL (ref 0.7–4.0)
MCH: 28.9 pg (ref 26.0–34.0)
MCHC: 31.7 g/dL (ref 30.0–36.0)
MCV: 91.2 fL (ref 80.0–100.0)
Monocytes Absolute: 0.7 10*3/uL (ref 0.1–1.0)
Monocytes Relative: 13 %
Neutro Abs: 3.7 10*3/uL (ref 1.7–7.7)
Neutrophils Relative %: 70 %
Platelets: 122 10*3/uL — ABNORMAL LOW (ref 150–400)
RBC: 4.88 MIL/uL (ref 4.22–5.81)
RDW: 16.6 % — ABNORMAL HIGH (ref 11.5–15.5)
WBC: 5.3 10*3/uL (ref 4.0–10.5)
nRBC: 0 % (ref 0.0–0.2)

## 2022-01-06 LAB — BASIC METABOLIC PANEL
Anion gap: 9 (ref 5–15)
BUN: 17 mg/dL (ref 8–23)
CO2: 24 mmol/L (ref 22–32)
Calcium: 9 mg/dL (ref 8.9–10.3)
Chloride: 108 mmol/L (ref 98–111)
Creatinine, Ser: 1.05 mg/dL (ref 0.61–1.24)
GFR, Estimated: >60 mL/min (ref >60)
Glucose, Bld: 123 mg/dL — ABNORMAL HIGH (ref 70–99)
Potassium: 4.7 mmol/L (ref 3.5–5.1)
Sodium: 141 mmol/L (ref 135–145)

## 2022-01-06 LAB — BRAIN NATRIURETIC PEPTIDE: B Natriuretic Peptide: 112.2 pg/mL — ABNORMAL HIGH (ref 0.0–100.0)

## 2022-01-06 MED ORDER — IPRATROPIUM-ALBUTEROL 0.5-2.5 (3) MG/3ML IN SOLN
3.0000 mL | Freq: Once | RESPIRATORY_TRACT | Status: AC
  Administered 2022-01-06: 3 mL via RESPIRATORY_TRACT
  Filled 2022-01-06: qty 3

## 2022-01-06 MED ORDER — PREDNISONE 20 MG PO TABS
60.0000 mg | ORAL_TABLET | Freq: Once | ORAL | Status: AC
  Administered 2022-01-06: 60 mg via ORAL
  Filled 2022-01-06: qty 3

## 2022-01-06 MED ORDER — ALBUTEROL SULFATE HFA 108 (90 BASE) MCG/ACT IN AERS
2.0000 | INHALATION_SPRAY | Freq: Once | RESPIRATORY_TRACT | Status: AC
  Administered 2022-01-06: 2 via RESPIRATORY_TRACT
  Filled 2022-01-06: qty 6.7

## 2022-01-06 MED ORDER — CARVEDILOL 12.5 MG PO TABS
12.5000 mg | ORAL_TABLET | Freq: Two times a day (BID) | ORAL | Status: DC
  Administered 2022-01-07 – 2022-01-08 (×3): 12.5 mg via ORAL
  Filled 2022-01-06 (×3): qty 1

## 2022-01-06 NOTE — Social Work (Signed)
CSW met with Pt at bedside. Per Pt he receives his oxygen supplies through [facility]. Pt states that he has an O2 tank for driving as well as a concentrator but that his sats dropped today but now that he is in ED his sats have improved.  TOC can have [facility] check his equipment to ensure proper functioning but this cannot occur until tomorrow.

## 2022-01-06 NOTE — ED Triage Notes (Signed)
Pt c/o increasing shortness of breath. States he normally wears 3-5L O2 [place], on arrival, pt's oxygen tank was empty, 74% on room air, placed on NRB by sort staff with improvement. Pt now on [facility] @ 94%. Denies chest pain.

## 2022-01-06 NOTE — ED Provider Notes (Signed)
[facility] EMERGENCY DEPARTMENT Provider Note   CSN: [id number] Arrival date & time: 01/06/22  1941     History  Chief Complaint  Patient presents with   Shortness of Breath    Cory Kemp is a 81 y.o. male.   Shortness of Breath  Patient with medical history of diabetes, alcohol use disorder, COPD, anemia, leukopenia presents today due to shortness of breath.  Patient is usually on 3-5L supplemental oxygen.  He states over the last few days he has been hypoxic and satting in the 70s and 80s at home.  Initially during triage there was concern that maybe he was out of oxygen but he patient tells me he has canisters at home.  He denies any chest pain but does feel really short of breath, he has been doing his DuoNeb and albuterol at home.  Denies chest pain, no fevers.  He lives at home with a 62 year old roommate.  Home Medications Prior to Admission medications   Medication Sig Start Date End Date Taking? Authorizing Provider  albuterol (PROVENTIL) (2.5 MG/3ML) 0.083% nebulizer solution Take 3 mLs (2.5 mg total) by nebulization every 6 (six) hours as needed for wheezing or shortness of breath. 01/03/18  Yes Lauraine Rinne, NP  albuterol (VENTOLIN HFA) 108 (90 Base) MCG/ACT inhaler INHALE 2 PUFFS BY MOUTH EVERY 6 HOURS AS NEEDED FOR WHEEZING OR SHORTNESS OF BREATH 10/27/21  Yes Mannam, Praveen, MD  montelukast (SINGULAIR) 10 MG tablet Take 10 mg by mouth daily. 04/23/21  Yes [provider]  predniSONE (DELTASONE) 10 MG tablet Take 1 tablet (10 mg total) by mouth daily with breakfast. 10/27/21  Yes Mannam, Praveen, MD  simvastatin (ZOCOR) 40 MG tablet Take 40 mg by mouth every evening. 06/04/21  Yes [provider]  arformoterol (BROVANA) 15 MCG/2ML NEBU Take 2 mLs (15 mcg total) by nebulization 2 (two) times daily. 10/27/21   Mannam, Hart Robinsons, MD  ascorbic acid (VITAMIN C) 500 MG tablet Take 1 tablet (500 mg total) by mouth daily. 04/25/20   Allie Bossier, MD  budesonide (PULMICORT) 0.5 MG/2ML nebulizer solution Take 2 mLs (0.5 mg total) by nebulization daily. 10/27/21   Mannam, Hart Robinsons, MD  carvedilol (COREG) 12.5 MG tablet Take 1 tablet (12.5 mg total) by mouth 2 (two) times daily with a meal. 07/16/21   Thurnell Lose, MD  escitalopram (LEXAPRO) 20 MG tablet Take 20 mg by mouth daily. 06/15/21   [provider]  fluticasone (FLONASE) 50 MCG/ACT nasal spray Place 2 sprays into both nostrils daily. Reduce to 1 spray each nostril daily after stopping Afrin 03/20/19   Mannam, Praveen, MD  isosorbide mononitrate (IMDUR) 30 MG 24 hr tablet Take 1 tablet (30 mg total) by mouth daily. 07/17/21   Thurnell Lose, MD  losartan (COZAAR) 50 MG tablet Take 50 mg by mouth daily. 05/11/21   [provider]  methimazole (TAPAZOLE) 5 MG tablet Take 1 tablet (5 mg total) by mouth 2 (two) times daily. 07/16/21   Thurnell Lose, MD  OXYGEN Inhale 4 L into the lungs continuous.    [provider]  revefenacin (YUPELRI) 175 MCG/3ML nebulizer solution Take 3 mLs (175 mcg total) by nebulization daily. 10/27/21   Mannam, Hart Robinsons, MD  Roflumilast 250 MCG TABS TAKE 1 TABLET BY MOUTH EVERY MORNING 07/29/21   Mannam, Praveen, MD      Allergies    Adhesive [tape] and Tegaderm alginate ag rope    Review of Systems  Review of Systems  Respiratory:  Positive for shortness of breath.    Physical Exam Updated Vital Signs BP (!) 151/81   Pulse (!) 109   Temp 98.3 F (36.8 C) (Oral)   Resp (!) 23   SpO2 95%  Physical Exam Vitals and nursing note reviewed. Exam conducted with a chaperone present.  Constitutional:      Appearance: Normal appearance.  HENT:     Head: Normocephalic and atraumatic.  Eyes:     General: No scleral icterus.       Right eye: No discharge.        Left eye: No discharge.     Extraocular Movements: Extraocular movements intact.     Pupils: Pupils are equal, round, and reactive to light.  Cardiovascular:      Rate and Rhythm: Regular rhythm. Tachycardia present.     Pulses: Normal pulses.     Heart sounds: Normal heart sounds. No murmur heard.   No friction rub. No gallop.  Pulmonary:     Effort: Pulmonary effort is normal. Tachypnea present. No respiratory distress.     Breath sounds: Decreased breath sounds and wheezing present.  Abdominal:     General: Abdomen is flat. Bowel sounds are normal. There is no distension.     Palpations: Abdomen is soft.     Tenderness: There is no abdominal tenderness.  Skin:    General: Skin is warm and dry.     Coloration: Skin is not jaundiced.  Neurological:     Mental Status: He is alert. Mental status is at baseline.     Coordination: Coordination normal.    ED Results / Procedures / Treatments   Labs (all labs ordered are listed, but only abnormal results are displayed) Labs Reviewed  BASIC METABOLIC PANEL - Abnormal; Notable for the following components:      Result Value   Glucose, Bld 123 (*)    All other components within normal limits  CBC WITH DIFFERENTIAL/PLATELET - Abnormal; Notable for the following components:   RDW 16.6 (*)    Platelets 122 (*)    Abs Immature Granulocytes 0.16 (*)    All other components within normal limits  BRAIN NATRIURETIC PEPTIDE - Abnormal; Notable for the following components:   B Natriuretic Peptide 112.2 (*)    All other components within normal limits    EKG EKG Interpretation  Date/Time:  Thursday January 06 2022 19:59:23 EDT Ventricular Rate:  111 PR Interval:  178 QRS Duration: 90 QT Interval:  332 QTC Calculation: 451 R Axis:   89 Text Interpretation: Sinus tachycardia with occasional Premature ventricular complexes Anteroseptal infarct , age undetermined No significant change since last tracing When compared with ECG of 11-Jul-2021 18:03, PREVIOUS ECG IS PRESENT Confirmed by Blanchie Dessert [id number]) on 01/06/2022 9:42:04 PM  Radiology DG Chest Portable 1 View  Result Date: 01/06/2022 CLINICAL  DATA:  Severe shortness of breath EXAM: PORTABLE CHEST 1 VIEW COMPARISON:  07/13/2021 FINDINGS: 2 frontal views of the chest demonstrate a stable cardiac silhouette. Severe background emphysema again noted. There is chronic central vascular congestion, without airspace disease, effusion, or pneumothorax. No acute bony abnormalities. IMPRESSION: 1. Emphysema. 2. Chronic central vascular congestion.  No acute process. Electronically Signed   By: Randa Ngo M.D.   On: 01/06/2022 20:58    Procedures Procedures    Medications Ordered in ED Medications  predniSONE (DELTASONE) tablet 60 mg (has no administration in time range)  ipratropium-albuterol (DUONEB) 0.5-2.5 (3) MG/3ML nebulizer solution 3  mL (has no administration in time range)  carvedilol (COREG) tablet 12.5 mg (has no administration in time range)  ipratropium-albuterol (DUONEB) 0.5-2.5 (3) MG/3ML nebulizer solution 3 mL (3 mLs Nebulization Given 01/06/22 2311)  albuterol (VENTOLIN HFA) 108 (90 Base) MCG/ACT inhaler 2 puff (2 puffs Inhalation Given 01/06/22 2321)    ED Course/ Medical Decision Making/ A&P                           Medical Decision Making Amount and/or Complexity of Data Reviewed Labs: ordered. Radiology: ordered.  Risk Prescription drug management. Decision regarding hospitalization.   Patient with medical history of COPD presents today due to shortness of breath.  Differential includes but not limited to respiratory failure with hypoxia, COPD exacerbation, pneumonia, CHF, ACS.  Patient was found to be significantly hypoxic during triage.  He was tachycardic in the 110s, put on supplemental oxygen and oxygenation improved to 92%.  He is wheezing and tachypneic on exam, tachycardic.  Considered PE but he is not having any chest pain I think that is less likely in the context of COPD exacerbation.  Initially I was concerned about patient not having oxygen at home so I consulted social work.  Patient has canister in  his car and at home but he is still satting in the 70s-80s.  Social work did come and speak to the patient, he has a canister and he has oxygen at home so there may be an issue with the device.  They have contacted the device manufacturer and in place coming on the morning to evaluate his Teresa Coombs to make sure it is working.  I ordered labs, patient does not have leukocytosis or anemia.  BMP is without gross electrolyte derangement or AKI.  BNP shows slight elevation but not significantly so.  On EKG patient has sinus tachycardia no underlying arrhythmia or ischemic findings noted.  Chest x-ray ordered and viewed by myself.  Hyperinflation and emphysema but no new or acute process.  On reexamination patient is still tachycardic, he is still having shortness of breath and tachypneic.  He is wheezing.    I did order albuterol, DuoNeb and prednisone but they have not been administered yet.  I reviewed the patient's medication list, I do not believe he is taken his beta-blocker.  I will order his carvedilol given he has been persistently tachycardic in the ED.  Patient is requiring 6 L supplemental oxygen but at baseline he usually is between 3-5.  I do think he would benefit from admission due to COPD exacerbation with worsening hypoxia.  I will consult hospitalist service.  Discussed HPI, physical exam and plan of care for this patient with attending [facility] The attending physician evaluated this patient as part of a shared visit and agrees with plan of care.          Final Clinical Impression(s) / ED Diagnoses Final diagnoses:  COPD exacerbation ([place])  Hypoxia  Acute respiratory failure with hypoxia [facility])    Rx / DC Orders ED Discharge Orders     None         Sherrill Raring, PA-C 01/06/22 2353    Blanchie Dessert, MD 01/13/22 [id number]

## 2022-01-06 NOTE — ED Notes (Signed)
SW at bedside.

## 2022-01-07 DIAGNOSIS — I5023 Acute on chronic systolic (congestive) heart failure: Secondary | ICD-10-CM | POA: Diagnosis not present

## 2022-01-07 DIAGNOSIS — D696 Thrombocytopenia, unspecified: Secondary | ICD-10-CM | POA: Diagnosis not present

## 2022-01-07 DIAGNOSIS — M1990 Unspecified osteoarthritis, unspecified site: Secondary | ICD-10-CM | POA: Diagnosis not present

## 2022-01-07 DIAGNOSIS — D649 Anemia, unspecified: Secondary | ICD-10-CM | POA: Diagnosis not present

## 2022-01-07 DIAGNOSIS — Z79899 Other long term (current) drug therapy: Secondary | ICD-10-CM | POA: Diagnosis not present

## 2022-01-07 DIAGNOSIS — E0590 Thyrotoxicosis, unspecified without thyrotoxic crisis or storm: Secondary | ICD-10-CM | POA: Diagnosis not present

## 2022-01-07 DIAGNOSIS — D72819 Decreased white blood cell count, unspecified: Secondary | ICD-10-CM | POA: Diagnosis not present

## 2022-01-07 DIAGNOSIS — F1010 Alcohol abuse, uncomplicated: Secondary | ICD-10-CM | POA: Diagnosis not present

## 2022-01-07 DIAGNOSIS — Z7951 Long term (current) use of inhaled steroids: Secondary | ICD-10-CM | POA: Diagnosis not present

## 2022-01-07 DIAGNOSIS — Z87891 Personal history of nicotine dependence: Secondary | ICD-10-CM | POA: Diagnosis not present

## 2022-01-07 DIAGNOSIS — Z91048 Other nonmedicinal substance allergy status: Secondary | ICD-10-CM | POA: Diagnosis not present

## 2022-01-07 DIAGNOSIS — Z9981 Dependence on supplemental oxygen: Secondary | ICD-10-CM | POA: Diagnosis not present

## 2022-01-07 DIAGNOSIS — J9621 Acute and chronic respiratory failure with hypoxia: Secondary | ICD-10-CM | POA: Diagnosis not present

## 2022-01-07 DIAGNOSIS — Z801 Family history of malignant neoplasm of trachea, bronchus and lung: Secondary | ICD-10-CM | POA: Diagnosis not present

## 2022-01-07 DIAGNOSIS — J441 Chronic obstructive pulmonary disease with (acute) exacerbation: Principal | ICD-10-CM | POA: Diagnosis present

## 2022-01-07 DIAGNOSIS — N400 Enlarged prostate without lower urinary tract symptoms: Secondary | ICD-10-CM | POA: Diagnosis not present

## 2022-01-07 DIAGNOSIS — I10 Essential (primary) hypertension: Secondary | ICD-10-CM | POA: Diagnosis not present

## 2022-01-07 DIAGNOSIS — I471 Supraventricular tachycardia: Secondary | ICD-10-CM | POA: Diagnosis not present

## 2022-01-07 DIAGNOSIS — Z7952 Long term (current) use of systemic steroids: Secondary | ICD-10-CM | POA: Diagnosis not present

## 2022-01-07 DIAGNOSIS — Z888 Allergy status to other drugs, medicaments and biological substances status: Secondary | ICD-10-CM | POA: Diagnosis not present

## 2022-01-07 DIAGNOSIS — R0902 Hypoxemia: Secondary | ICD-10-CM | POA: Diagnosis not present

## 2022-01-07 DIAGNOSIS — E785 Hyperlipidemia, unspecified: Secondary | ICD-10-CM | POA: Diagnosis not present

## 2022-01-07 DIAGNOSIS — I110 Hypertensive heart disease with heart failure: Secondary | ICD-10-CM | POA: Diagnosis not present

## 2022-01-07 DIAGNOSIS — E119 Type 2 diabetes mellitus without complications: Secondary | ICD-10-CM | POA: Diagnosis not present

## 2022-01-07 LAB — CBC
HCT: 40.8 % (ref 39.0–52.0)
Hemoglobin: 12.9 g/dL — ABNORMAL LOW (ref 13.0–17.0)
MCH: 28.9 pg (ref 26.0–34.0)
MCHC: 31.6 g/dL (ref 30.0–36.0)
MCV: 91.3 fL (ref 80.0–100.0)
Platelets: 114 10*3/uL — ABNORMAL LOW (ref 150–400)
RBC: 4.47 MIL/uL (ref 4.22–5.81)
RDW: 16.1 % — ABNORMAL HIGH (ref 11.5–15.5)
WBC: 4.6 10*3/uL (ref 4.0–10.5)
nRBC: 0 % (ref 0.0–0.2)

## 2022-01-07 LAB — HEMOGLOBIN A1C
Hgb A1c MFr Bld: 5.7 % — ABNORMAL HIGH (ref 4.8–5.6)
Mean Plasma Glucose: 116.89 mg/dL

## 2022-01-07 LAB — CBG MONITORING, ED
Glucose-Capillary: 146 mg/dL — ABNORMAL HIGH (ref 70–99)
Glucose-Capillary: 188 mg/dL — ABNORMAL HIGH (ref 70–99)

## 2022-01-07 LAB — GLUCOSE, CAPILLARY
Glucose-Capillary: 152 mg/dL — ABNORMAL HIGH (ref 70–99)
Glucose-Capillary: 197 mg/dL — ABNORMAL HIGH (ref 70–99)

## 2022-01-07 MED ORDER — FLUTICASONE PROPIONATE 50 MCG/ACT NA SUSP
2.0000 | Freq: Every day | NASAL | Status: DC
Start: 2022-01-07 — End: 2022-01-08
  Administered 2022-01-07 – 2022-01-08 (×2): 2 via NASAL
  Filled 2022-01-07: qty 16

## 2022-01-07 MED ORDER — INSULIN ASPART 100 UNIT/ML IJ SOLN
0.0000 [IU] | Freq: Three times a day (TID) | INTRAMUSCULAR | Status: DC
  Administered 2022-01-07: 1 [IU] via SUBCUTANEOUS
  Administered 2022-01-07 – 2022-01-08 (×3): 2 [IU] via SUBCUTANEOUS

## 2022-01-07 MED ORDER — ESCITALOPRAM OXALATE 20 MG PO TABS
20.0000 mg | ORAL_TABLET | Freq: Every day | ORAL | Status: DC
  Administered 2022-01-07 – 2022-01-08 (×2): 20 mg via ORAL
  Filled 2022-01-07: qty 1
  Filled 2022-01-07: qty 2

## 2022-01-07 MED ORDER — IPRATROPIUM-ALBUTEROL 0.5-2.5 (3) MG/3ML IN SOLN
3.0000 mL | Freq: Four times a day (QID) | RESPIRATORY_TRACT | Status: DC | PRN
Start: 2022-01-07 — End: 2022-01-07
  Filled 2022-01-07: qty 3

## 2022-01-07 MED ORDER — IPRATROPIUM-ALBUTEROL 0.5-2.5 (3) MG/3ML IN SOLN
3.0000 mL | Freq: Three times a day (TID) | RESPIRATORY_TRACT | Status: DC
  Administered 2022-01-07 – 2022-01-08 (×3): 3 mL via RESPIRATORY_TRACT
  Filled 2022-01-07 (×4): qty 3

## 2022-01-07 MED ORDER — METHYLPREDNISOLONE SODIUM SUCC 40 MG IJ SOLR: 40.0000 mg | Freq: Every day | INTRAMUSCULAR | Status: DC

## 2022-01-07 MED ORDER — BUDESONIDE 0.5 MG/2ML IN SUSP
0.5000 mg | Freq: Every day | RESPIRATORY_TRACT | Status: DC
  Administered 2022-01-07 – 2022-01-08 (×2): 0.5 mg via RESPIRATORY_TRACT
  Filled 2022-01-07 (×2): qty 2

## 2022-01-07 MED ORDER — GUAIFENESIN ER 600 MG PO TB12
600.0000 mg | ORAL_TABLET | Freq: Two times a day (BID) | ORAL | Status: DC
  Administered 2022-01-07 – 2022-01-08 (×3): 600 mg via ORAL
  Filled 2022-01-07 (×3): qty 1

## 2022-01-07 MED ORDER — ENOXAPARIN SODIUM 40 MG/0.4ML IJ SOSY
40.0000 mg | PREFILLED_SYRINGE | Freq: Every day | INTRAMUSCULAR | Status: DC
  Administered 2022-01-07: 40 mg via SUBCUTANEOUS
  Filled 2022-01-07: qty 0.4

## 2022-01-07 MED ORDER — VITAMIN B-12 1000 MCG PO TABS
1000.0000 ug | ORAL_TABLET | Freq: Every day | ORAL | Status: DC
  Administered 2022-01-07 – 2022-01-08 (×2): 1000 ug via ORAL
  Filled 2022-01-07 (×2): qty 1

## 2022-01-07 MED ORDER — LORATADINE 10 MG PO TABS
10.0000 mg | ORAL_TABLET | Freq: Every day | ORAL | Status: DC
  Administered 2022-01-07 – 2022-01-08 (×2): 10 mg via ORAL
  Filled 2022-01-07 (×2): qty 1

## 2022-01-07 MED ORDER — AZITHROMYCIN 500 MG PO TABS
500.0000 mg | ORAL_TABLET | Freq: Every day | ORAL | Status: DC
  Administered 2022-01-07 – 2022-01-08 (×2): 500 mg via ORAL
  Filled 2022-01-07: qty 2
  Filled 2022-01-07: qty 1

## 2022-01-07 MED ORDER — METHYLPREDNISOLONE SODIUM SUCC 125 MG IJ SOLR
80.0000 mg | Freq: Every day | INTRAMUSCULAR | Status: DC
  Administered 2022-01-07 – 2022-01-08 (×2): 80 mg via INTRAVENOUS
  Filled 2022-01-07 (×2): qty 2

## 2022-01-07 MED ORDER — FUROSEMIDE 10 MG/ML IJ SOLN
40.0000 mg | Freq: Every day | INTRAMUSCULAR | Status: DC
  Administered 2022-01-07: 40 mg via INTRAVENOUS
  Filled 2022-01-07: qty 4

## 2022-01-07 MED ORDER — OXYMETAZOLINE HCL 0.05 % NA SOLN
1.0000 | Freq: Two times a day (BID) | NASAL | Status: DC
  Administered 2022-01-07 – 2022-01-08 (×3): 1 via NASAL
  Filled 2022-01-07: qty 30

## 2022-01-07 MED ORDER — ARFORMOTEROL TARTRATE 15 MCG/2ML IN NEBU
15.0000 ug | INHALATION_SOLUTION | Freq: Two times a day (BID) | RESPIRATORY_TRACT | Status: DC
  Administered 2022-01-07 – 2022-01-08 (×3): 15 ug via RESPIRATORY_TRACT
  Filled 2022-01-07 (×4): qty 2

## 2022-01-07 MED ORDER — SIMVASTATIN 20 MG PO TABS
40.0000 mg | ORAL_TABLET | Freq: Every evening | ORAL | Status: DC
  Administered 2022-01-07: 40 mg via ORAL
  Filled 2022-01-07: qty 2

## 2022-01-07 MED ORDER — REVEFENACIN 175 MCG/3ML IN SOLN
175.0000 ug | Freq: Every day | RESPIRATORY_TRACT | Status: DC
  Administered 2022-01-07 – 2022-01-08 (×2): 175 ug via RESPIRATORY_TRACT
  Filled 2022-01-07 (×2): qty 3

## 2022-01-07 MED ORDER — MONTELUKAST SODIUM 10 MG PO TABS
10.0000 mg | ORAL_TABLET | Freq: Every day | ORAL | Status: DC
  Administered 2022-01-07 – 2022-01-08 (×2): 10 mg via ORAL
  Filled 2022-01-07 (×2): qty 1

## 2022-01-07 NOTE — Progress Notes (Signed)
PROGRESS NOTE        PATIENT DETAILS Name: Cory Kemp Age: 81 y.o. Sex: male Date of Birth: [birth year] Admit Date: 01/06/2022 Admitting Physician Orene Desanctis, DO PCP:Kim, Jeneen Rinks, MD  Brief Summary: Patient is a 81 y.o.  male with history of COPD on home O2-3-4 L-presenting with worsening shortness of breath-found to have worsening hypoxic respiratory failure (O2 saturation 70% on home O2 regimen) due to COPD exacerbation and subsequently admitted to the hospitalist service.   Significant events: 6/1>> admit to [facility] for worsening shortness of breath/productive cough-found to have COPD exacerbation. 07/13/2021>> Echo-EF 55-60%  Significant studies: 6/1>> CXR: No PNA  Significant microbiology data: None  Procedures: None  Consults: None   Subjective: Still wheezing-not yet at baseline-appears comfortable.  Objective: Vitals: Blood pressure (!) 149/120, pulse 72, temperature 98.3 F (36.8 C), temperature source Oral, resp. rate 19, SpO2 96 %.   Exam: Gen Exam:Alert awake-not in any distress HEENT:atraumatic, normocephalic Chest: Coarse rhonchi all over. CVS:S1S2 regular Abdomen:soft non tender, non distended Extremities:+ edema Neurology: Non focal Skin: no rash  Pertinent Labs/Radiology:    Latest Ref Rng & Units 01/07/2022    2:46 AM 01/06/2022    8:23 PM 07/16/2021    2:25 AM  CBC  WBC 4.0 - 10.5 K/uL 4.6   5.3   7.0    Hemoglobin 13.0 - 17.0 g/dL 12.9   14.1   13.4    Hematocrit 39.0 - 52.0 % 40.8   44.5   41.1    Platelets 150 - 400 K/uL 114   122   102      Lab Results  Component Value Date   NA 141 01/06/2022   K 4.7 01/06/2022   CL 108 01/06/2022   CO2 24 01/06/2022      Assessment/Plan: Acute on chronic hypoxic respiratory failure due to COPD exacerbation: Better but still not at baseline-with coarse rhonchi all over-continue steroids/bronchodilators/.  Add empiric Zithromax.  He complains of significant nasal  congestion-suspect postnasal drip may be playing a role in his symptoms-starting Claritin/Flonase/Afrin.  Mild HFrEF exacerbation: Has 1+ pitting edema-acknowledges orthopnea-sleeps in a recliner-recent echo as above-we will start IV Lasix to see if this will improve edema/SOB.  Thrombocytopenia: Appears mild-watch closely.  Seems to be a chronic issue dating back to 2022.  History of SVT: Monitor on telemetry-on beta-blocker.  HTN: BP controlled-continue Coreg  DM-2 (A1c 5.7 on 6/2): Continue SSI monitor CBGs.  Recent Labs    01/07/22 0737  GLUCAP 146*    HLD: Continue statin  History of hyperthyroidism: Apparently no longer on Tapazole-repeat TSH.  BMI: Estimated body mass index is 28.26 kg/m as calculated from the following:   Height as of 10/27/21: 6' (1.829 m).   Weight as of 10/27/21: 94.5 kg.   Code status:   Code Status: Full Code   DVT Prophylaxis: enoxaparin (LOVENOX) injection 40 mg Start: 01/07/22 1000   Family Communication: Daughter [name]-[phone]-left Voicemail on 6/2.  Called son-[phone]-left voicemail on 6/2   Disposition Plan: Status is: Observation The patient will require care spanning > 2 midnights and should be moved to inpatient because: COPD exacerbation-not yet at baseline-appears frail-debilitated-appears to have some amount of volume overload on IV Lasix.  Probably require at least 2-3 days of hospitalization for safe discharge.   Planned Discharge Destination:Home health   Diet:  Diet Order             Diet Heart Room service appropriate? Yes; Fluid consistency: Thin  Diet effective now                     Antimicrobial agents: Anti-infectives (From admission, onward)    Start     Dose/Rate Route Frequency Ordered Stop   01/07/22 1000  azithromycin (ZITHROMAX) tablet 500 mg        500 mg Oral Daily 01/07/22 0817 01/10/22 0959        MEDICATIONS: Scheduled Meds:  arformoterol  15 mcg Nebulization BID    azithromycin  500 mg Oral Daily   budesonide  0.5 mg Nebulization Daily   carvedilol  12.5 mg Oral BID WC   enoxaparin (LOVENOX) injection  40 mg Subcutaneous Daily   escitalopram  20 mg Oral Daily   fluticasone  2 spray Each Nare Daily   furosemide  40 mg Intravenous Daily   guaiFENesin  600 mg Oral BID   insulin aspart  0-9 Units Subcutaneous TID WC   ipratropium-albuterol  3 mL Nebulization TID   loratadine  10 mg Oral Daily   methylPREDNISolone (SOLU-MEDROL) injection  80 mg Intravenous Daily   montelukast  10 mg Oral Daily   oxymetazoline  1 spray Each Nare BID   revefenacin  175 mcg Nebulization Daily   simvastatin  40 mg Oral QPM   vitamin B-12  1,000 mcg Oral Daily   Continuous Infusions: PRN Meds:.   I have personally reviewed following labs and imaging studies  LABORATORY DATA: CBC: Recent Labs  Lab 01/06/22 2023 01/07/22 0246  WBC 5.3 4.6  NEUTROABS 3.7  --   HGB 14.1 12.9*  HCT 44.5 40.8  MCV 91.2 91.3  PLT 122* 114*    Basic Metabolic Panel: Recent Labs  Lab 01/06/22 2023  NA 141  K 4.7  CL 108  CO2 24  GLUCOSE 123*  BUN 17  CREATININE 1.05  CALCIUM 9.0    GFR: CrCl cannot be calculated (Unknown ideal weight.).  Liver Function Tests: No results for input(s): AST, ALT, ALKPHOS, BILITOT, PROT, ALBUMIN in the last 168 hours. No results for input(s): LIPASE, AMYLASE in the last 168 hours. No results for input(s): AMMONIA in the last 168 hours.  Coagulation Profile: No results for input(s): INR, PROTIME in the last 168 hours.  Cardiac Enzymes: No results for input(s): CKTOTAL, CKMB, CKMBINDEX, TROPONINI in the last 168 hours.  BNP (last 3 results) No results for input(s): PROBNP in the last 8760 hours.  Lipid Profile: No results for input(s): CHOL, HDL, LDLCALC, TRIG, CHOLHDL, LDLDIRECT in the last 72 hours.  Thyroid Function Tests: No results for input(s): TSH, T4TOTAL, FREET4, T3FREE, THYROIDAB in the last 72 hours.  Anemia  Panel: No results for input(s): VITAMINB12, FOLATE, FERRITIN, TIBC, IRON, RETICCTPCT in the last 72 hours.  Urine analysis:    Component Value Date/Time   COLORURINE YELLOW 04/19/2020 1926   APPEARANCEUR CLEAR 04/19/2020 1926   LABSPEC 1.016 04/19/2020 1926   PHURINE 5.0 04/19/2020 1926   GLUCOSEU NEGATIVE 04/19/2020 1926   HGBUR LARGE (A) 04/19/2020 1926   BILIRUBINUR NEGATIVE 04/19/2020 1926   KETONESUR 5 (A) 04/19/2020 1926   PROTEINUR 100 (A) 04/19/2020 1926   UROBILINOGEN 0.2 07/22/2013 0725   NITRITE NEGATIVE 04/19/2020 1926   LEUKOCYTESUR NEGATIVE 04/19/2020 1926    Sepsis Labs: Lactic Acid, Venous    Component Value Date/Time   LATICACIDVEN 1.3 07/11/2021  2127    MICROBIOLOGY: No results found for this or any previous visit (from the past 240 hour(s)).  RADIOLOGY STUDIES/RESULTS: DG Chest Portable 1 View  Result Date: 01/06/2022 CLINICAL DATA:  Severe shortness of breath EXAM: PORTABLE CHEST 1 VIEW COMPARISON:  07/13/2021 FINDINGS: 2 frontal views of the chest demonstrate a stable cardiac silhouette. Severe background emphysema again noted. There is chronic central vascular congestion, without airspace disease, effusion, or pneumothorax. No acute bony abnormalities. IMPRESSION: 1. Emphysema. 2. Chronic central vascular congestion.  No acute process. Electronically Signed   By: Randa Ngo M.D.   On: 01/06/2022 20:58     LOS: 0 days   Oren Binet, MD  [facility]    To contact the attending provider between 7A-7P or the covering provider during after hours 7P-7A, please log into the web site [email or web address] and access using universal [place] password for that web site. If you do not have the password, please call the hospital operator.  01/07/2022, 11:55 AM

## 2022-01-07 NOTE — Evaluation (Signed)
Physical Therapy Evaluation Patient Details Name: Cory Kemp MRN: [id number] DOB: 01-08-1941 Today's Date: 01/07/2022  History of Present Illness  81 y.o. M admitted on 01/06/22 due to SOB. Found to have worksening hypoxic respiratory failure due to COPD exacerbation. PMH significant for COPD, Arthritis, HTN, Osteopenia.  Clinical Impression  Pt admitted secondary to problem above with deficits below. Pt requiring min guard A for mobility tasks this session. Mobility limited secondary to SOB and fatigue. Oxygen sats at 90-91% on 5L. Recommending HHPT at d/c given current deficits. Will continue to follow acutely.        Recommendations for follow up therapy are one component of a multi-disciplinary discharge planning process, led by the attending physician.  Recommendations may be updated based on patient status, additional functional criteria and insurance authorization.  Follow Up Recommendations Home health PT (may progress to no PT follow up)    Assistance Recommended at Discharge Intermittent Supervision/Assistance  Patient can return home with the following  Assistance with cooking/housework;Assist for transportation;Help with stairs or ramp for entrance    Equipment Recommendations None recommended by PT  Recommendations for Other Services       Functional Status Assessment Patient has had a recent decline in their functional status and demonstrates the ability to make significant improvements in function in a reasonable and predictable amount of time.     Precautions / Restrictions Precautions Precautions: Fall Precaution Comments: watch O2 Restrictions Weight Bearing Restrictions: No      Mobility  Bed Mobility Overal bed mobility: Modified Independent             General bed mobility comments: Increased time required    Transfers Overall transfer level: Needs assistance Equipment used: None Transfers: Sit to/from Stand, Bed to chair/wheelchair/BSC Sit to  Stand: Supervision Stand pivot transfers: Supervision         General transfer comment: Very cautious with mobility tasks. Increased SOB noted, but sats at 90-91% on 5L.    Ambulation/Gait                  Stairs            Wheelchair Mobility    Modified Rankin (Stroke Patients Only)       Balance Overall balance assessment: Mild deficits observed, not formally tested                                           Pertinent Vitals/Pain Pain Assessment Pain Assessment: No/denies pain    Home Living Family/patient expects to be discharged to:: Private residence Living Arrangements: Alone Available Help at Discharge: Friend(s);Available PRN/intermittently Type of Home: Mobile home Home Access: Stairs to enter Entrance Stairs-Rails: Right Entrance Stairs-Number of Steps: 5   Home Layout: One level Home Equipment: Rollator (4 wheels);Shower seat;Other (comment) (O2)      Prior Function Prior Level of Function : Independent/Modified Independent             Mobility Comments: walks without a device; has a woodworking shop ADLs Comments: Indep     Hand Dominance   Dominant Hand: Left    Extremity/Trunk Assessment   Upper Extremity Assessment Upper Extremity Assessment: Defer to OT evaluation    Lower Extremity Assessment Lower Extremity Assessment: Generalized weakness    Cervical / Trunk Assessment Cervical / Trunk Assessment: Normal  Communication   Communication: HOH  Cognition Arousal/Alertness: Awake/alert  Behavior During Therapy: WFL for tasks assessed/performed Overall Cognitive Status: No family/caregiver present to determine baseline cognitive functioning                                 General Comments: Repeating stories at times throughout session        General Comments      Exercises     Assessment/Plan    PT Assessment Patient needs continued PT services  PT Problem List Decreased  strength;Decreased activity tolerance;Decreased balance;Decreased mobility;Decreased knowledge of use of DME;Decreased knowledge of precautions;Cardiopulmonary status limiting activity       PT Treatment Interventions DME instruction;Gait training;Functional mobility training;Therapeutic activities;Stair training;Therapeutic exercise;Balance training;Patient/family education    PT Goals (Current goals can be found in the Care Plan section)  Acute Rehab PT Goals Patient Stated Goal: to feel better PT Goal Formulation: With patient Time For Goal Achievement: 01/21/22 Potential to Achieve Goals: Good    Frequency Min 3X/week     Co-evaluation               AM-PAC PT "6 Clicks" Mobility  Outcome Measure Help needed turning from your back to your side while in a flat bed without using bedrails?: None Help needed moving from lying on your back to sitting on the side of a flat bed without using bedrails?: None Help needed moving to and from a bed to a chair (including a wheelchair)?: A Little Help needed standing up from a chair using your arms (e.g., wheelchair or bedside chair)?: A Little Help needed to walk in hospital room?: A Little Help needed climbing 3-5 steps with a railing? : A Lot 6 Click Score: 19    End of Session Equipment Utilized During Treatment: Gait belt Activity Tolerance: Treatment limited secondary to medical complications (Comment) (SOB) Patient left: in chair;with call bell/phone within reach (in chair in ED) Nurse Communication: Mobility status PT Visit Diagnosis: Muscle weakness (generalized) (M62.81);Unsteadiness on feet (R26.81)    Time: 6811-5726 PT Time Calculation (min) (ACUTE ONLY): 17 min   Charges:   PT Evaluation $PT Eval Moderate Complexity: 1 Mod          Reuel Derby, PT, DPT  Acute Rehabilitation Services  Office: [phone]   Rudean Hitt 01/07/2022, 4:22 PM

## 2022-01-07 NOTE — H&P (Signed)
History and Physical    Patient: Cory Kemp:[id number] DOB: 09/19/1940 DOA: 01/06/2022 DOS: the patient was seen and examined on 01/07/2022 PCP: Jani Gravel, MD  Patient coming from: Home  Chief Complaint:  Chief Complaint  Patient presents with   Shortness of Breath   HPI: Cory Kemp is a 81 y.o. male with medical history significant of COPD with nocturnal hypoxia on supplemental oxygen (3-5L),Type 2 diabetes, hypertension who presents with shortness of breath.  Patient overall limited historian. For the past 2 days, has noticed increasing shortness of breath worse with exertion and nonproductive cough.  Has found pulse ox to be around 70% on his home oxygen.  However unable to tell me how many liters he is at home.  He has been using it as needed.  Also questions whether his oxygen concentrator is working properly.  He denies any chest pain or lower extremity edema.  No nausea, vomiting or diarrhea.  No current tobacco use.  In the ED, he was afebrile mildly tachycardic and normotensive and 95% on 6 L.  No leukocytosis or anemia.  BMP is unremarkable. Chest x-ray shows emphysema and chronic central vascular congestion but no acute process.  He was given DuoNeb x2 and 60 mg of prednisone in the ED.  Hospitalist then called for admission of COPD exacerbation. Review of Systems: As mentioned in the history of present illness. All other systems reviewed and are negative. Past Medical History:  Diagnosis Date   Alcohol abuse    Anemia    Arthritis    Asthma    B12 deficiency 11/03/2013   BPH (benign prostatic hypertrophy)    COPD (chronic obstructive pulmonary disease) (HCC)    Dementia (HCC)    ?   Diabetes mellitus ([place]) 01/10/2012   Femoral hernia, right 03/27/2012   Glucose intolerance (impaired glucose tolerance)    Hyperlipidemia    Hypertension    Incisional hernias - swiss-cheese-type periumbilical 03/08/1913   Inguinal hernia - right 01/10/2012   Leukopenia 01/29/2013    Monocytosis 01/29/2013   Normochromic anemia 01/29/2013   Osteopenia    Past Surgical History:  Procedure Laterality Date   HEMORRHOID SURGERY     HERNIA REPAIR     INGUINAL HERNIA REPAIR  02/24/2012   Procedure: LAPAROSCOPIC INGUINAL HERNIA;  Surgeon: Adin Hector, MD;  Location: [facility];  Service: General;  Laterality: N/A;   TUMOR EXCISION     intestinal after SBO ( Benign), appendectomy   VENTRAL HERNIA REPAIR  02/24/2012   Procedure: LAPAROSCOPIC VENTRAL HERNIA;  Surgeon: Adin Hector, MD;  Location: [facility];  Service: General;  Laterality: N/A;  lysis of adhesions, excision of abdominal wall lipomae   Social History:  reports that he quit smoking about 3 years ago. His smoking use included cigarettes. He started smoking about 66 years ago. He has a 63.00 pack-year smoking history. He has never used smokeless tobacco. He reports that he does not drink alcohol and does not use drugs.  Allergies  Allergen Reactions   Adhesive [Tape] Rash    No "PLASTIC" tape!!! Patient broke out in blisters!!   Tegaderm Alginate Ag Rope Rash    Family History  Problem Relation Age of Onset   Lung cancer Father    Lung cancer Mother    Diabetes Brother     Prior to Admission medications   Medication Sig Start Date End Date Taking? Authorizing Provider  acetaminophen (TYLENOL) 500 MG tablet Take 1,000 mg by mouth every  6 (six) hours as needed for mild pain.   Yes [provider]  albuterol (PROVENTIL) (2.5 MG/3ML) 0.083% nebulizer solution Take 3 mLs (2.5 mg total) by nebulization every 6 (six) hours as needed for wheezing or shortness of breath. 01/03/18  Yes Lauraine Rinne, NP  albuterol (VENTOLIN HFA) 108 (90 Base) MCG/ACT inhaler INHALE 2 PUFFS BY MOUTH EVERY 6 HOURS AS NEEDED FOR WHEEZING OR SHORTNESS OF BREATH Patient taking differently: Inhale 2 puffs into the lungs every 6 (six) hours as needed for wheezing or shortness of breath. INHALE 2 PUFFS BY MOUTH EVERY 6 HOURS AS NEEDED FOR  WHEEZING OR SHORTNESS OF BREATH 10/27/21  Yes Mannam, Praveen, MD  arformoterol (BROVANA) 15 MCG/2ML NEBU Take 2 mLs (15 mcg total) by nebulization 2 (two) times daily. 10/27/21  Yes Mannam, Praveen, MD  ascorbic acid (VITAMIN C) 500 MG tablet Take 1 tablet (500 mg total) by mouth daily. 04/25/20  Yes Allie Bossier, MD  budesonide (PULMICORT) 0.5 MG/2ML nebulizer solution Take 2 mLs (0.5 mg total) by nebulization daily. 10/27/21  Yes Mannam, Praveen, MD  cholecalciferol (VITAMIN D3) 25 MCG (1000 UNIT) tablet Take 1,000 Units by mouth daily.   Yes [provider]  escitalopram (LEXAPRO) 20 MG tablet Take 20 mg by mouth daily. 06/15/21  Yes [provider]  montelukast (SINGULAIR) 10 MG tablet Take 10 mg by mouth daily. 04/23/21  Yes [provider]  predniSONE (DELTASONE) 10 MG tablet Take 1 tablet (10 mg total) by mouth daily with breakfast. 10/27/21  Yes Mannam, Praveen, MD  revefenacin (YUPELRI) 175 MCG/3ML nebulizer solution Take 3 mLs (175 mcg total) by nebulization daily. 10/27/21  Yes Mannam, Praveen, MD  simvastatin (ZOCOR) 40 MG tablet Take 40 mg by mouth every evening. 06/04/21  Yes [provider]  vitamin B-12 (CYANOCOBALAMIN) 500 MCG tablet Take 1,000 mcg by mouth daily.   Yes [provider]  carvedilol (COREG) 12.5 MG tablet Take 1 tablet (12.5 mg total) by mouth 2 (two) times daily with a meal. Patient not taking: Reported on 01/06/2022 07/16/21   Thurnell Lose, MD  fluticasone (FLONASE) 50 MCG/ACT nasal spray Place 2 sprays into both nostrils daily. Reduce to 1 spray each nostril daily after stopping Afrin Patient not taking: Reported on 01/06/2022 03/20/19   [name], MD  isosorbide mononitrate (IMDUR) 30 MG 24 hr tablet Take 1 tablet (30 mg total) by mouth daily. Patient not taking: Reported on 01/06/2022 07/17/21   Thurnell Lose, MD  methimazole (TAPAZOLE) 5 MG tablet Take 1 tablet (5 mg total) by mouth 2 (two) times daily. Patient  not taking: Reported on 01/06/2022 07/16/21   Thurnell Lose, MD  OXYGEN Inhale 4 L into the lungs continuous.    [provider]  Roflumilast 250 MCG TABS TAKE 1 TABLET BY MOUTH EVERY MORNING Patient not taking: Reported on 01/06/2022 07/29/21   [name], MD    Physical Exam: Vitals:   01/06/22 2345 01/07/22 0000 01/07/22 0015 01/07/22 0315  BP: (!) 151/81 (!) 145/94  (!) 113/96  Pulse: (!) 109 (!) 110 (!) 104 (!) 107  Resp: (!) '23 20  20  '$ Temp:      TempSrc:      SpO2: 95% 94%  96%   Constitutional: NAD, calm, comfortable, elderly male laying flat in bed asleep with mouth open Eyes: lids and conjunctivae normal ENMT: Mucous membranes are moist. Neck: normal, supple Respiratory: Diminished breath sounds throughout but, no wheezing, no crackles. Normal  respiratory effort on 6L via [place]. No accessory muscle use.  Cardiovascular: Regular rate and rhythm, no murmurs / rubs / gallops. No extremity edema.  Abdomen: no tenderness, no masses palpated. Bowel sounds positive.  Musculoskeletal: no clubbing / cyanosis. No joint deformity upper and lower extremities. Good ROM, no contractures. Normal muscle tone.  Skin: no rashes, lesions, ulcers. No induration Neurologic: CN 2-12 grossly intact.  Strength 5/5 in all 4.  Psychiatric: Normal judgment and insight. Alert and oriented x 3. Normal mood. Data Reviewed:  See HPI  Assessment and Plan: * Acute on chronic respiratory failure with hypoxia ([place]) Secondary to COPD exacerbation Follows with pulmonology outpatient.  He has continual worsening of his COPD.  Previously on Daliresp but discontinued due to side effects of diarrhea.  Normally on nocturnal oxygen supplementation around 3-5L. -admitted on 6L via [place]. Wean as tolerated to maintain at 88%-92%. Suspect he may need O2 24/7. -duoneb q6 PRN -IV solu medrol daily. He is on '10mg'$  of prednisone chronically.  Essential hypertension Controlled.  Diabetes mellitus ([place]) Place  on sensitive SSI      Advance Care Planning:   Code Status: Full Code   Consults: none  Family Communication: no family at bedside  Severity of Illness: The appropriate patient status for this patient is OBSERVATION. Observation status is judged to be reasonable and necessary in order to provide the required intensity of service to ensure the patient's safety. The patient's presenting symptoms, physical exam findings, and initial radiographic and laboratory data in the context of their medical condition is felt to place them at decreased risk for further clinical deterioration. Furthermore, it is anticipated that the patient will be medically stable for discharge from the hospital within 2 midnights of admission.   Author: Orene Desanctis, DO 01/07/2022 3:53 AM  For on call review [email or web address].

## 2022-01-07 NOTE — Assessment & Plan Note (Signed)
Controlled.  

## 2022-01-07 NOTE — Assessment & Plan Note (Deleted)
Follows with pulmonology outpatient.  He has continual worsening of his COPD.  Previously on Daliresp but discontinued due to side effects of diarrhea.  Normally on nocturnal oxygen supplementation. -admitted on 6L via [place]. Wean as tolerated to maintain at 88%-92%. Suspect he may need O2 24/7. -duoneb q6 PRN -IV solu medrol daily. He is on '10mg'$  of prednisone chronically.

## 2022-01-07 NOTE — Discharge Planning (Signed)
RNCM received call from [employer] Rep.  [name] states that the company had a technician visit pt home 5/15 and left 15 tanks of back up oxygen.  [name] will have tech return to pt home upon discharge to trouble shoot/educate further.    Please call [name] [phone] when pt is ready to be discharged to coordinate home visit.

## 2022-01-07 NOTE — Assessment & Plan Note (Signed)
Secondary to COPD exacerbation Follows with pulmonology outpatient.  He has continual worsening of his COPD.  Previously on Daliresp but discontinued due to side effects of diarrhea.  Normally on nocturnal oxygen supplementation around 3-5L. -admitted on 6L via [place]. Wean as tolerated to maintain at 88%-92%. Suspect he may need O2 24/7. -duoneb q6 PRN -IV solu medrol daily. He is on '10mg'$  of prednisone chronically.

## 2022-01-07 NOTE — Evaluation (Signed)
Occupational Therapy Evaluation Patient Details Name: Cory Kemp MRN: [id number] DOB: 1941/03/30 Today's Date: 01/07/2022   History of Present Illness 81 y.o. M admitted on 01/06/22 due to SOB. Found to have worksening hypoxic respiratory failure due to COPD exacerbation. PMH significant for COPD, Arthritis, HTN, Osteopenia.   Clinical Impression   Pt admitted for concerns listed above. PTA pt reported that he was independent with all ADL's and IADL's. At this time, he is limited by decreased activity tolerance and increased O2 needs. Pt started the session at rest on 4L, had to be bumped up to 6L with exertion, then returned to 5L as he recovered. OT will continue to follow acutely to address activity tolerance and energy conservation needs.       Recommendations for follow up therapy are one component of a multi-disciplinary discharge planning process, led by the attending physician.  Recommendations may be updated based on patient status, additional functional criteria and insurance authorization.   Follow Up Recommendations  No OT follow up    Assistance Recommended at Discharge PRN  Patient can return home with the following Help with stairs or ramp for entrance;Assistance with cooking/housework    Functional Status Assessment  Patient has had a recent decline in their functional status and demonstrates the ability to make significant improvements in function in a reasonable and predictable amount of time.  Equipment Recommendations  None recommended by OT    Recommendations for Other Services       Precautions / Restrictions Precautions Precautions: Fall Precaution Comments: watch O2 Restrictions Weight Bearing Restrictions: No      Mobility Bed Mobility Overal bed mobility: Modified Independent                  Transfers Overall transfer level: Modified independent Equipment used: None                      Balance Overall balance assessment: Mild  deficits observed, not formally tested                                         ADL either performed or assessed with clinical judgement   ADL Overall ADL's : Needs assistance/impaired                                       General ADL Comments: Overall min guard due to O2 needs, line management, and decreased activity tolerance     Vision Baseline Vision/History: 1 Wears glasses Ability to See in Adequate Light: 0 Adequate Patient Visual Report: No change from baseline Vision Assessment?: No apparent visual deficits     Perception     Praxis      Pertinent Vitals/Pain Pain Assessment Pain Assessment: No/denies pain     Hand Dominance Left   Extremity/Trunk Assessment Upper Extremity Assessment Upper Extremity Assessment: Overall WFL for tasks assessed   Lower Extremity Assessment Lower Extremity Assessment: Defer to PT evaluation   Cervical / Trunk Assessment Cervical / Trunk Assessment: Normal   Communication Communication Communication: HOH   Cognition Arousal/Alertness: Awake/alert Behavior During Therapy: WFL for tasks assessed/performed Overall Cognitive Status: No family/caregiver present to determine baseline cognitive functioning  General Comments: Repeating stories at times throughout session     General Comments  On 4L resting in bed, O2 at 97%. Sitting up EOB O2 dropped to mid 80s, bumped up to 5L. Pt then ambulated to bathroom, spO2 dropped to low 80's on 5L, bumped up to 6L. Took 3-4 mins EOB to recover, then returned pt to 5L, satting around 95%. O2 will follow.    Exercises     Shoulder Instructions      Home Living Family/patient expects to be discharged to:: Private residence Living Arrangements: Alone Available Help at Discharge: Friend(s);Available PRN/intermittently Type of Home: Mobile home Home Access: Stairs to enter     Home Layout: One level      Bathroom Shower/Tub: Teacher, early years/pre: Standard     Home Equipment: Rollator (4 wheels);Shower seat;Other (comment) (O2)          Prior Functioning/Environment Prior Level of Function : Independent/Modified Independent             Mobility Comments: walks without a device ADLs Comments: Indep        OT Problem List: Decreased strength;Decreased activity tolerance;Impaired balance (sitting and/or standing);Decreased cognition;Cardiopulmonary status limiting activity      OT Treatment/Interventions: Therapeutic exercise;Energy conservation;Therapeutic activities;Cognitive remediation/compensation;Patient/family education;Balance training    OT Goals(Current goals can be found in the care plan section) Acute Rehab OT Goals Patient Stated Goal: To be able to tolerate activities for longer OT Goal Formulation: With patient Time For Goal Achievement: 01/21/22 Potential to Achieve Goals: Good ADL Goals Additional ADL Goal #1: Pt will tolerate standing ADL tasks for 5 mins with no seated rest break. Additional ADL Goal #2: Pt will report 3 energy conservation techniques that he can use at home.  OT Frequency: Min 2X/week    Co-evaluation              AM-PAC OT "6 Clicks" Daily Activity     Outcome Measure Help from another person eating meals?: None Help from another person taking care of personal grooming?: A Little Help from another person toileting, which includes using toliet, bedpan, or urinal?: A Little Help from another person bathing (including washing, rinsing, drying)?: A Little Help from another person to put on and taking off regular upper body clothing?: A Little Help from another person to put on and taking off regular lower body clothing?: A Little 6 Click Score: 19   End of Session Equipment Utilized During Treatment: Oxygen Nurse Communication: Mobility status  Activity Tolerance: Patient tolerated treatment well Patient left:  with call bell/phone within reach;in bed  OT Visit Diagnosis: Unsteadiness on feet (R26.81);Other abnormalities of gait and mobility (R26.89);Muscle weakness (generalized) (M62.81)                Time: 5277-8242 OT Time Calculation (min): 44 min Charges:  OT General Charges $OT Visit: 1 Visit OT Evaluation $OT Eval Moderate Complexity: 1 Mod OT Treatments $Self Care/Home Management : 23-37 mins  Kolston Lacount H., OTR/L Acute Rehabilitation  Josslin Sanjuan Elane Keshona Kartes 01/07/2022, 1:08 PM

## 2022-01-07 NOTE — Discharge Planning (Signed)
RNCM left voicemail for [employer] Rep regarding oxygen supply at home.  Will update team.

## 2022-01-07 NOTE — Assessment & Plan Note (Signed)
   Managed with sensitive sliding scale 

## 2022-01-08 DIAGNOSIS — I471 Supraventricular tachycardia: Secondary | ICD-10-CM | POA: Diagnosis not present

## 2022-01-08 DIAGNOSIS — J441 Chronic obstructive pulmonary disease with (acute) exacerbation: Secondary | ICD-10-CM | POA: Diagnosis not present

## 2022-01-08 DIAGNOSIS — I10 Essential (primary) hypertension: Secondary | ICD-10-CM | POA: Diagnosis not present

## 2022-01-08 DIAGNOSIS — J9621 Acute and chronic respiratory failure with hypoxia: Secondary | ICD-10-CM | POA: Diagnosis not present

## 2022-01-08 LAB — BASIC METABOLIC PANEL
Anion gap: 4 — ABNORMAL LOW (ref 5–15)
BUN: 31 mg/dL — ABNORMAL HIGH (ref 8–23)
CO2: 30 mmol/L (ref 22–32)
Calcium: 8.6 mg/dL — ABNORMAL LOW (ref 8.9–10.3)
Chloride: 104 mmol/L (ref 98–111)
Creatinine, Ser: 1.26 mg/dL — ABNORMAL HIGH (ref 0.61–1.24)
GFR, Estimated: 58 mL/min — ABNORMAL LOW (ref >60)
Glucose, Bld: 175 mg/dL — ABNORMAL HIGH (ref 70–99)
Potassium: 4.8 mmol/L (ref 3.5–5.1)
Sodium: 138 mmol/L (ref 135–145)

## 2022-01-08 LAB — GLUCOSE, CAPILLARY
Glucose-Capillary: 136 mg/dL — ABNORMAL HIGH (ref 70–99)
Glucose-Capillary: 113 mg/dL — ABNORMAL HIGH (ref 70–99)

## 2022-01-08 LAB — MAGNESIUM: Magnesium: 2.1 mg/dL (ref 1.7–2.4)

## 2022-01-08 MED ORDER — GUAIFENESIN ER 600 MG PO TB12: 600.0000 mg | ORAL_TABLET | Freq: Two times a day (BID) | ORAL | 0 refills | Status: AC

## 2022-01-08 MED ORDER — CARVEDILOL 12.5 MG PO TABS: 12.5000 mg | ORAL_TABLET | Freq: Two times a day (BID) | ORAL | 2 refills | Status: AC

## 2022-01-08 MED ORDER — PREDNISONE 10 MG PO TABS: 10.0000 mg | ORAL_TABLET | Freq: Every day | ORAL | 3 refills | Status: DC

## 2022-01-08 MED ORDER — PREDNISONE 10 MG PO TABS: ORAL_TABLET | ORAL | 3 refills | Status: DC

## 2022-01-08 MED ORDER — AZITHROMYCIN 500 MG PO TABS: 500.0000 mg | ORAL_TABLET | Freq: Every day | ORAL | 0 refills | Status: DC

## 2022-01-08 MED ORDER — FLUTICASONE PROPIONATE 50 MCG/ACT NA SUSP: 2.0000 | Freq: Every day | NASAL | 2 refills | Status: AC

## 2022-01-08 MED ORDER — LORATADINE 10 MG PO TABS: 10.0000 mg | ORAL_TABLET | Freq: Every day | ORAL | 0 refills | Status: AC

## 2022-01-08 MED ORDER — OXYMETAZOLINE HCL 0.05 % NA SOLN: 1.0000 | Freq: Two times a day (BID) | NASAL | 0 refills | Status: AC

## 2022-01-08 NOTE — Discharge Summary (Signed)
PATIENT DETAILS Name: Cory Kemp Age: 81 y.o. Sex: male Date of Birth: [birth year] MRN: [id number]. Admitting Physician: Jonetta Osgood, MD PCP:Kim, Jeneen Rinks, MD  Admit Date: 01/06/2022 Discharge date: 01/08/2022  Recommendations for Outpatient Follow-up:  Follow up with PCP in 1-2 weeks Please obtain CMP/CBC in one week  Admitted From:  Home  Disposition: Home   Discharge Condition: good  CODE STATUS:   Code Status: Full Code   Diet recommendation:  Diet Order             Diet - low sodium heart healthy           Diet Carb Modified           Diet Heart Room service appropriate? Yes; Fluid consistency: Thin  Diet effective now                    Brief Summary: Patient is a 81 y.o.  male with history of COPD on home O2-3-4 L-presenting with worsening shortness of breath-found to have worsening hypoxic respiratory failure (O2 saturation 70% on home O2 regimen) due to COPD exacerbation and subsequently admitted to the hospitalist service.     Significant events: 6/1>> admit to [facility] for worsening shortness of breath/productive cough-found to have COPD exacerbation. 07/13/2021>> Echo-EF 55-60%   Significant studies: 6/1>> CXR: No PNA   Significant microbiology data: None   Procedures: None   Consults: None   Brief Hospital Course: Acute on chronic hypoxic respiratory failure due to COPD exacerbation: Significant improvement-he claims he is back to his baseline-treated with IV steroids/bronchodilators/Zithromax and empiric furosemide.  He had significant nasal congestion and that was treated with Claritin/Flonase and Afrin.  On discharge-he will continue his usual bronchodilator regimen-we will be on tapering steroids-he will be on Zithromax for a few more days.  I will continue his Flonase and Claritin on discharge as well.   Mild HFrEF exacerbation: Significant improvement with IV diuretics-had very minimal edema-PCP can evaluate in the outpatient setting  whether he needs to be on a diuretic regimen.     Thrombocytopenia: Appears mild-watch closely.  Seems to be a chronic issue dating back to 2022.   History of SVT: Stable on beta-blocker.   HTN: BP controlled-continue Coreg  DM-2 (A1c 5.7 on 6/2): Continue diet control.  HLD: Continue statin   History of hyperthyroidism: Apparently no longer on Tapazole-son/daughter-unaware as to who stopped Tapazole.  He is already on a beta-blocker-he has no overt signs of hypothyroidism.  He apparently has a follow-up appointment with his PCP soon-suspect a TSH can be repeated in the outpatient setting.  Family/patient aware of this recommendation.     BMI: Estimated body mass index is 28.26 kg/m as calculated from the following:   Height as of 10/27/21: 6' (1.829 m).   Weight as of 10/27/21: 94.5 kg.    Discharge Diagnoses:  Principal Problem:   Acute on chronic respiratory failure with hypoxia (HCC) Active Problems:   Diabetes mellitus ([place])   Essential hypertension   COPD with acute exacerbation (HCC)   COPD exacerbation ([place])   Discharge Instructions:  Activity:  As tolerated with Full fall precautions use walker/cane & assistance as needed   Discharge Instructions     Call MD for:  difficulty breathing, headache or visual disturbances   Complete by: As directed    Diet - low sodium heart healthy   Complete by: As directed    Diet Carb Modified   Complete by: As directed  Discharge instructions   Complete by: As directed    Follow with Primary MD  Jani Gravel, MD in 1-2 weeks  Follow-up with your pulmonologist in 1-2 weeks  Please ask your primary care practitioner to check a TSH level (thyroid hormone level) at your next visit.  Please get a complete blood count and chemistry panel checked by your Primary MD at your next visit, and again as instructed by your Primary MD.  Get Medicines reviewed and adjusted: Please take all your medications with you for your next visit  with your Primary MD  Laboratory/radiological data: Please request your Primary MD to go over all hospital tests and procedure/radiological results at the follow up, please ask your Primary MD to get all Hospital records sent to his/her office.  In some cases, they will be blood work, cultures and biopsy results pending at the time of your discharge. Please request that your primary care M.D. follows up on these results.  Also Note the following: If you experience worsening of your admission symptoms, develop shortness of breath, life threatening emergency, suicidal or homicidal thoughts you must seek medical attention immediately by calling 911 or calling your MD immediately  if symptoms less severe.  You must read complete instructions/literature along with all the possible adverse reactions/side effects for all the Medicines you take and that have been prescribed to you. Take any new Medicines after you have completely understood and accpet all the possible adverse reactions/side effects.   Do not drive when taking Pain medications or sleeping medications (Benzodaizepines)  Do not take more than prescribed Pain, Sleep and Anxiety Medications. It is not advisable to combine anxiety,sleep and pain medications without talking with your primary care practitioner  Special Instructions: If you have smoked or chewed Tobacco  in the last 2 yrs please stop smoking, stop any regular Alcohol  and or any Recreational drug use.  Wear Seat belts while driving.  Please note: You were cared for by a hospitalist during your hospital stay. Once you are discharged, your primary care physician will handle any further medical issues. Please note that NO REFILLS for any discharge medications will be authorized once you are discharged, as it is imperative that you return to your primary care physician (or establish a relationship with a primary care physician if you do not have one) for your post hospital discharge  needs so that they can reassess your need for medications and monitor your lab values.   Increase activity slowly   Complete by: As directed       Allergies as of 01/08/2022       Reactions   Adhesive [tape] Rash   No "PLASTIC" tape!!! Patient broke out in blisters!!   Tegaderm Alginate Ag Rope Rash        Medication List     STOP taking these medications    isosorbide mononitrate 30 MG 24 hr tablet Commonly known as: IMDUR   methimazole 5 MG tablet Commonly known as: TAPAZOLE       TAKE these medications    acetaminophen 500 MG tablet Commonly known as: TYLENOL Take 1,000 mg by mouth every 6 (six) hours as needed for mild pain.   albuterol (2.5 MG/3ML) 0.083% nebulizer solution Commonly known as: PROVENTIL Take 3 mLs (2.5 mg total) by nebulization every 6 (six) hours as needed for wheezing or shortness of breath. What changed: Another medication with the same name was changed. Make sure you understand how and when to take each.  albuterol 108 (90 Base) MCG/ACT inhaler Commonly known as: VENTOLIN HFA INHALE 2 PUFFS BY MOUTH EVERY 6 HOURS AS NEEDED FOR WHEEZING OR SHORTNESS OF BREATH What changed:  how much to take how to take this when to take this reasons to take this   arformoterol 15 MCG/2ML Nebu Commonly known as: BROVANA Take 2 mLs (15 mcg total) by nebulization 2 (two) times daily.   ascorbic acid 500 MG tablet Commonly known as: VITAMIN C Take 1 tablet (500 mg total) by mouth daily.   azithromycin 500 MG tablet Commonly known as: ZITHROMAX Take 1 tablet (500 mg total) by mouth daily.   budesonide 0.5 MG/2ML nebulizer solution Commonly known as: Pulmicort Take 2 mLs (0.5 mg total) by nebulization daily.   carvedilol 12.5 MG tablet Commonly known as: COREG Take 1 tablet (12.5 mg total) by mouth 2 (two) times daily with a meal.   cholecalciferol 25 MCG (1000 UNIT) tablet Commonly known as: VITAMIN D3 Take 1,000 Units by mouth daily.    escitalopram 20 MG tablet Commonly known as: LEXAPRO Take 20 mg by mouth daily.   fluticasone 50 MCG/ACT nasal spray Commonly known as: Flonase Place 2 sprays into both nostrils daily. Reduce to 1 spray each nostril daily after stopping Afrin   guaiFENesin 600 MG 12 hr tablet Commonly known as: MUCINEX Take 1 tablet (600 mg total) by mouth 2 (two) times daily for 7 days.   loratadine 10 MG tablet Commonly known as: CLARITIN Take 1 tablet (10 mg total) by mouth daily for 15 days.   montelukast 10 MG tablet Commonly known as: SINGULAIR Take 10 mg by mouth daily.   OXYGEN Inhale 4 L into the lungs continuous.   oxymetazoline 0.05 % nasal spray Commonly known as: AFRIN Place 1 spray into both nostrils 2 (two) times daily for 2 days.   predniSONE 10 MG tablet Commonly known as: DELTASONE Take 4 tablets p.o. daily for 2 days, then 3 tablets p.o. daily for 2 days, then 2 tablets p.o. daily for 2 days and then resume your usual dosing of 1 tablet daily. What changed:  how much to take how to take this when to take this additional instructions   Roflumilast 250 MCG Tabs TAKE 1 TABLET BY MOUTH EVERY MORNING   simvastatin 40 MG tablet Commonly known as: ZOCOR Take 40 mg by mouth every evening.   vitamin B-12 500 MCG tablet Commonly known as: CYANOCOBALAMIN Take 1,000 mcg by mouth daily.   Yupelri 175 MCG/3ML nebulizer solution Generic drug: revefenacin Take 3 mLs (175 mcg total) by nebulization daily.        Allergies  Allergen Reactions   Adhesive [Tape] Rash    No "PLASTIC" tape!!! Patient broke out in blisters!!   Tegaderm Alginate Ag Rope Rash     Other Procedures/Studies: DG Chest Portable 1 View  Result Date: 01/06/2022 CLINICAL DATA:  Severe shortness of breath EXAM: PORTABLE CHEST 1 VIEW COMPARISON:  07/13/2021 FINDINGS: 2 frontal views of the chest demonstrate a stable cardiac silhouette. Severe background emphysema again noted. There is chronic  central vascular congestion, without airspace disease, effusion, or pneumothorax. No acute bony abnormalities. IMPRESSION: 1. Emphysema. 2. Chronic central vascular congestion.  No acute process. Electronically Signed   By: Randa Ngo M.D.   On: 01/06/2022 20:58     TODAY-DAY OF DISCHARGE:  Subjective:   [name] today has no headache,no chest abdominal pain,no new weakness tingling or numbness, feels much better wants to go home today.  Objective:   Blood pressure (!) 130/57, pulse 75, temperature 98.4 F (36.9 C), temperature source Oral, resp. rate 20, SpO2 95 %.  Intake/Output Summary (Last 24 hours) at 01/08/2022 0851 Last data filed at 01/07/2022 1800 Gross per 24 hour  Intake 120 ml  Output --  Net 120 ml   There were no vitals filed for this visit.  Exam: Awake Alert, Oriented *3, No new F.N deficits, Normal affect [place].AT,PERRAL Supple Neck,No JVD, No cervical lymphadenopathy appriciated.  Symmetrical Chest wall movement, Good air movement bilaterally, CTAB RRR,No Gallops,Rubs or new Murmurs, No Parasternal Heave +ve B.Sounds, Abd Soft, Non tender, No organomegaly appriciated, No rebound -guarding or rigidity. No Cyanosis, Clubbing or edema, No new Rash or bruise   PERTINENT RADIOLOGIC STUDIES: DG Chest Portable 1 View  Result Date: 01/06/2022 CLINICAL DATA:  Severe shortness of breath EXAM: PORTABLE CHEST 1 VIEW COMPARISON:  07/13/2021 FINDINGS: 2 frontal views of the chest demonstrate a stable cardiac silhouette. Severe background emphysema again noted. There is chronic central vascular congestion, without airspace disease, effusion, or pneumothorax. No acute bony abnormalities. IMPRESSION: 1. Emphysema. 2. Chronic central vascular congestion.  No acute process. Electronically Signed   By: Randa Ngo M.D.   On: 01/06/2022 20:58     PERTINENT LAB RESULTS: CBC: Recent Labs    01/06/22 2023 01/07/22 0246  WBC 5.3 4.6  HGB 14.1 12.9*  HCT 44.5 40.8  PLT  122* 114*   CMET CMP     Component Value Date/Time   NA 138 01/08/2022 0114   NA 140 02/20/2013 1148   K 4.8 01/08/2022 0114   K 4.1 02/20/2013 1148   CL 104 01/08/2022 0114   CO2 30 01/08/2022 0114   CO2 26 02/20/2013 1148   GLUCOSE 175 (H) 01/08/2022 0114   GLUCOSE 89 02/20/2013 1148   BUN 31 (H) 01/08/2022 0114   BUN 12.8 02/20/2013 1148   CREATININE 1.26 (H) 01/08/2022 0114   CREATININE 0.8 02/20/2013 1148   CALCIUM 8.6 (L) 01/08/2022 0114   CALCIUM 9.2 02/20/2013 1148   PROT 5.5 (L) 07/16/2021 0225   PROT 7.0 02/20/2013 1148   ALBUMIN 2.9 (L) 07/16/2021 0225   ALBUMIN 3.7 02/20/2013 1148   AST 19 07/16/2021 0225   AST 14 02/20/2013 1148   ALT 24 07/16/2021 0225   ALT 10 02/20/2013 1148   ALKPHOS 43 07/16/2021 0225   ALKPHOS 63 02/20/2013 1148   BILITOT 0.7 07/16/2021 0225   BILITOT 0.59 02/20/2013 1148   GFRNONAA 58 (L) 01/08/2022 0114   GFRAA >60 04/24/2020 0409    GFR CrCl cannot be calculated (Unknown ideal weight.). No results for input(s): LIPASE, AMYLASE in the last 72 hours. No results for input(s): CKTOTAL, CKMB, CKMBINDEX, TROPONINI in the last 72 hours. Invalid input(s): POCBNP No results for input(s): DDIMER in the last 72 hours. Recent Labs    01/07/22 0619  HGBA1C 5.7*   No results for input(s): CHOL, HDL, LDLCALC, TRIG, CHOLHDL, LDLDIRECT in the last 72 hours. No results for input(s): TSH, T4TOTAL, T3FREE, THYROIDAB in the last 72 hours.  Invalid input(s): FREET3 No results for input(s): VITAMINB12, FOLATE, FERRITIN, TIBC, IRON, RETICCTPCT in the last 72 hours. Coags: No results for input(s): INR in the last 72 hours.  Invalid input(s): PT Microbiology: No results found for this or any previous visit (from the past 240 hour(s)).  FURTHER DISCHARGE INSTRUCTIONS:  Get Medicines reviewed and adjusted: Please take all your medications with you for your next visit with your Primary  MD  Laboratory/radiological data: Please request your  Primary MD to go over all hospital tests and procedure/radiological results at the follow up, please ask your Primary MD to get all Hospital records sent to his/her office.  In some cases, they will be blood work, cultures and biopsy results pending at the time of your discharge. Please request that your primary care M.D. goes through all the records of your hospital data and follows up on these results.  Also Note the following: If you experience worsening of your admission symptoms, develop shortness of breath, life threatening emergency, suicidal or homicidal thoughts you must seek medical attention immediately by calling 911 or calling your MD immediately  if symptoms less severe.  You must read complete instructions/literature along with all the possible adverse reactions/side effects for all the Medicines you take and that have been prescribed to you. Take any new Medicines after you have completely understood and accpet all the possible adverse reactions/side effects.   Do not drive when taking Pain medications or sleeping medications (Benzodaizepines)  Do not take more than prescribed Pain, Sleep and Anxiety Medications. It is not advisable to combine anxiety,sleep and pain medications without talking with your primary care practitioner  Special Instructions: If you have smoked or chewed Tobacco  in the last 2 yrs please stop smoking, stop any regular Alcohol  and or any Recreational drug use.  Wear Seat belts while driving.  Please note: You were cared for by a hospitalist during your hospital stay. Once you are discharged, your primary care physician will handle any further medical issues. Please note that NO REFILLS for any discharge medications will be authorized once you are discharged, as it is imperative that you return to your primary care physician (or establish a relationship with a primary care physician if you do not have one) for your post hospital discharge needs so that they  can reassess your need for medications and monitor your lab values.  Total Time spent coordinating discharge including counseling, education and face to face time equals greater than 30 minutes.  SignedOren Binet 01/08/2022 8:51 AM

## 2022-01-08 NOTE — TOC Transition Note (Signed)
Transition of Care [facility]) - CM/SW Discharge Note   Patient Details  Name: Cory Kemp MRN: [id number] Date of Birth: July 07, 1941  [facility] CM/SW Contact:  Carles Collet, RN Phone Number: 01/08/2022, 9:30 AM   Clinical Narrative:    Spoke w patient to discuss DC plan. He is agreeable to return to [facility] services with [place]. [facility] accepted referral. Called and spoke w [name] at [place] who will arrangeto have new concentrator delivered today. She states he has enough oxygen in tanks to get through until concentrator is delivered.  Patient states that he has portable oxygen in his car, and that his car is here and will drive himself home. Spoke with [name]  to verify that this is an acceptable plan for him. She was total agreement that he will be ok to drive himself home. She also verified that he has portable oxygen in his car. She also provided that his son [name] is working in the ED until 11 am if we need anything or any help with DC.   [name],[name] Daughter   [phone]  [name], [name]   [phone]       Final next level of care: Home w Home Health Services Barriers to Discharge: No Barriers Identified   Patient Goals and CMS Choice Patient states their goals for this hospitalization and ongoing recovery are:: to go home CMS Medicare.gov Compare Post Acute Care list provided to:: Patient Choice offered to / list presented to : Patient  Discharge Placement                       Discharge Plan and Services                    Date DME Agency Contacted: 01/08/22 Time DME Agency Contacted: [id number] Representative spoke with at DME Agency: [name] at [place]: [place]: [place] Date [place]: 01/08/22 Time [place]: 0930 Representative spoke with at [place]: [place] Determinants of Health ([place]) Interventions     Readmission Risk Interventions     View : No data to display.

## 2022-01-08 NOTE — Plan of Care (Signed)
Problem: Education: Goal: Knowledge of disease or condition will improve Outcome: Adequate for Discharge Goal: Knowledge of the prescribed therapeutic regimen will improve Outcome: Adequate for Discharge Goal: Individualized Educational Video(s) Outcome: Adequate for Discharge   Problem: Activity: Goal: Ability to tolerate increased activity will improve Outcome: Adequate for Discharge Goal: Will verbalize the importance of balancing activity with adequate rest periods Outcome: Adequate for Discharge   Problem: Respiratory: Goal: Ability to maintain a clear airway will improve Outcome: Adequate for Discharge Goal: Levels of oxygenation will improve Outcome: Adequate for Discharge Goal: Ability to maintain adequate ventilation will improve Outcome: Adequate for Discharge   Problem: Activity: Goal: Ability to tolerate increased activity will improve Outcome: Adequate for Discharge Goal: Will verbalize the importance of balancing activity with adequate rest periods Outcome: Adequate for Discharge   Problem: Respiratory: Goal: Ability to maintain a clear airway will improve Outcome: Adequate for Discharge Goal: Levels of oxygenation will improve Outcome: Adequate for Discharge Goal: Ability to maintain adequate ventilation will improve Outcome: Adequate for Discharge   Problem: Education: Goal: Ability to describe self-care measures that may prevent or decrease complications (Diabetes Survival Skills Education) will improve Outcome: Adequate for Discharge Goal: Individualized Educational Video(s) Outcome: Adequate for Discharge   Problem: Education: Goal: Ability to describe self-care measures that may prevent or decrease complications (Diabetes Survival Skills Education) will improve Outcome: Adequate for Discharge Goal: Individualized Educational Video(s) Outcome: Adequate for Discharge   Problem: Coping: Goal: Ability to adjust to condition or change in health will  improve Outcome: Adequate for Discharge   Problem: Fluid Volume: Goal: Ability to maintain a balanced intake and output will improve Outcome: Adequate for Discharge   Problem: Coping: Goal: Ability to adjust to condition or change in health will improve Outcome: Adequate for Discharge   Problem: Fluid Volume: Goal: Ability to maintain a balanced intake and output will improve Outcome: Adequate for Discharge   Problem: Health Behavior/Discharge Planning: Goal: Ability to identify and utilize available resources and services will improve Outcome: Adequate for Discharge Goal: Ability to manage health-related needs will improve Outcome: Adequate for Discharge   Problem: Health Behavior/Discharge Planning: Goal: Ability to identify and utilize available resources and services will improve Outcome: Adequate for Discharge Goal: Ability to manage health-related needs will improve Outcome: Adequate for Discharge   Problem: Metabolic: Goal: Ability to maintain appropriate glucose levels will improve Outcome: Adequate for Discharge   Problem: Nutritional: Goal: Maintenance of adequate nutrition will improve Outcome: Adequate for Discharge Goal: Progress toward achieving an optimal weight will improve Outcome: Adequate for Discharge   Problem: Metabolic: Goal: Ability to maintain appropriate glucose levels will improve Outcome: Adequate for Discharge   Problem: Nutritional: Goal: Maintenance of adequate nutrition will improve Outcome: Adequate for Discharge Goal: Progress toward achieving an optimal weight will improve Outcome: Adequate for Discharge   Problem: Skin Integrity: Goal: Risk for impaired skin integrity will decrease Outcome: Adequate for Discharge   Problem: Nutritional: Goal: Maintenance of adequate nutrition will improve Outcome: Adequate for Discharge Goal: Progress toward achieving an optimal weight will improve Outcome: Adequate for Discharge   Problem:  Skin Integrity: Goal: Risk for impaired skin integrity will decrease Outcome: Adequate for Discharge   Problem: Tissue Perfusion: Goal: Adequacy of tissue perfusion will improve Outcome: Adequate for Discharge   Problem: Skin Integrity: Goal: Risk for impaired skin integrity will decrease Outcome: Adequate for Discharge   Problem: Tissue Perfusion: Goal: Adequacy of tissue perfusion will improve Outcome: Adequate for Discharge   Problem: Acute  Rehab OT Goals (only OT should resolve) Goal: OT Additional ADL Goal #1 Outcome: Adequate for Discharge Goal: OT Additional ADL Goal #2 Outcome: Adequate for Discharge   Problem: Tissue Perfusion: Goal: Adequacy of tissue perfusion will improve Outcome: Adequate for Discharge   Problem: Acute Rehab OT Goals (only OT should resolve) Goal: OT Additional ADL Goal #1 Outcome: Adequate for Discharge Goal: OT Additional ADL Goal #2 Outcome: Adequate for Discharge   Problem: Acute Rehab PT Goals(only PT should resolve) Goal: Patient Will Transfer Sit To/From Stand Outcome: Adequate for Discharge Goal: Pt Will Ambulate Outcome: Adequate for Discharge Goal: Pt Will Go Up/Down Stairs Outcome: Adequate for Discharge   Problem: Acute Rehab OT Goals (only OT should resolve) Goal: OT Additional ADL Goal #1 Outcome: Adequate for Discharge Goal: OT Additional ADL Goal #2 Outcome: Adequate for Discharge   Problem: Acute Rehab PT Goals(only PT should resolve) Goal: Patient Will Transfer Sit To/From Stand Outcome: Adequate for Discharge Goal: Pt Will Ambulate Outcome: Adequate for Discharge Goal: Pt Will Go Up/Down Stairs Outcome: Adequate for Discharge   Problem: Acute Rehab PT Goals(only PT should resolve) Goal: Patient Will Transfer Sit To/From Stand Outcome: Adequate for Discharge Goal: Pt Will Ambulate Outcome: Adequate for Discharge Goal: Pt Will Go Up/Down Stairs Outcome: Adequate for Discharge

## 2022-01-08 NOTE — Progress Notes (Signed)
Discharge instructions, RX's and follow up appt explained and provided to patient verbalized understanding. Patient left floor via wheelchair accompanied by staff. No c/o pain or shortness of breath at d/c.  Per Beagley, Tivis Ringer, RN

## 2022-01-12 DIAGNOSIS — Z7952 Long term (current) use of systemic steroids: Secondary | ICD-10-CM | POA: Diagnosis not present

## 2022-01-12 DIAGNOSIS — J9621 Acute and chronic respiratory failure with hypoxia: Secondary | ICD-10-CM | POA: Diagnosis not present

## 2022-01-12 DIAGNOSIS — I5021 Acute systolic (congestive) heart failure: Secondary | ICD-10-CM | POA: Diagnosis not present

## 2022-01-12 DIAGNOSIS — Z792 Long term (current) use of antibiotics: Secondary | ICD-10-CM | POA: Diagnosis not present

## 2022-01-12 DIAGNOSIS — D696 Thrombocytopenia, unspecified: Secondary | ICD-10-CM | POA: Diagnosis not present

## 2022-01-12 DIAGNOSIS — E119 Type 2 diabetes mellitus without complications: Secondary | ICD-10-CM | POA: Diagnosis not present

## 2022-01-12 DIAGNOSIS — I110 Hypertensive heart disease with heart failure: Secondary | ICD-10-CM | POA: Diagnosis not present

## 2022-01-12 DIAGNOSIS — I471 Supraventricular tachycardia: Secondary | ICD-10-CM | POA: Diagnosis not present

## 2022-01-12 DIAGNOSIS — Z7951 Long term (current) use of inhaled steroids: Secondary | ICD-10-CM | POA: Diagnosis not present

## 2022-01-12 DIAGNOSIS — Z604 Social exclusion and rejection: Secondary | ICD-10-CM | POA: Diagnosis not present

## 2022-01-12 DIAGNOSIS — Z8546 Personal history of malignant neoplasm of prostate: Secondary | ICD-10-CM | POA: Diagnosis not present

## 2022-01-12 DIAGNOSIS — E039 Hypothyroidism, unspecified: Secondary | ICD-10-CM | POA: Diagnosis not present

## 2022-01-12 DIAGNOSIS — J441 Chronic obstructive pulmonary disease with (acute) exacerbation: Secondary | ICD-10-CM | POA: Diagnosis not present

## 2022-01-12 DIAGNOSIS — E785 Hyperlipidemia, unspecified: Secondary | ICD-10-CM | POA: Diagnosis not present

## 2022-01-12 DIAGNOSIS — Z556 Problems related to health literacy: Secondary | ICD-10-CM | POA: Diagnosis not present

## 2022-01-12 DIAGNOSIS — D649 Anemia, unspecified: Secondary | ICD-10-CM | POA: Diagnosis not present

## 2022-01-12 DIAGNOSIS — J449 Chronic obstructive pulmonary disease, unspecified: Secondary | ICD-10-CM | POA: Diagnosis not present

## 2022-01-13 ENCOUNTER — Other Ambulatory Visit: Payer: Medicare Other | Admitting: Hospice

## 2022-01-13 DIAGNOSIS — J9621 Acute and chronic respiratory failure with hypoxia: Secondary | ICD-10-CM | POA: Diagnosis not present

## 2022-01-13 DIAGNOSIS — Z515 Encounter for palliative care: Secondary | ICD-10-CM | POA: Diagnosis not present

## 2022-01-13 DIAGNOSIS — J449 Chronic obstructive pulmonary disease, unspecified: Secondary | ICD-10-CM

## 2022-01-13 NOTE — Progress Notes (Signed)
[facility] Consult Note Telephone: [phone]  Fax: [phone]  PATIENT NAME: Cory Kemp [address] [place] [address] [phone] (home)  DOB: Oct 20, 1940 MRN: [id number]  PRIMARY CARE PROVIDER:    Jani Gravel, MD,  [address] [place] [address] [phone]  REFERRING PROVIDER:   Jani Gravel, [place],  [place] [address] [place], with same practice  RESPONSIBLE PARTY:   Self/[name] Patient [phone] best to call Contact Information     Name Relation Home Work Mobile   Louischarles,[name] Daughter   [phone]   Mycah, Formica   [phone]      Palliative Care was asked to follow this patient by consultation request of  Jani Gravel, MD to address advance care planning, complex medical decision making and goals of care clarification. NP called [name] and left her a voicemail with callback number and they will see if; if you open the day On the passenger seat this is a follow up visit.    ASSESSMENT AND / RECOMMENDATIONS:   CODE STATUS: Patient elects to be a Partial code; wishes to have chest compressions and ACLS medications, does not want mechanical ventilation/life support.  Goals of Care: Goals include to maximize quality of life and symptom management.  Patient is open to hospice service in the future if he continues to decline in functional status.   Visit consisted of counseling and education dealing with the complex and emotionally intense issues of symptom management and palliative care in the setting of serious and potentially life-threatening illness.  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  He shared his strong spiritual values  and practices which are his main anchor supporting him through his health challenges.  Therapeutic listening and ample emotional support provided. Palliative care team will continue to support patient, patient's family, and medical team. Symptom Management/Plan: Acute and chronic respiratory failure with hypoxia: related to COPD exacerbation, he was hospitalized 6/1 - 01/08/2022, treated and discharged home.  Continue oxygen supplementation as ordered.  Balance between rest and performance.  COPD: worsening with exacerbation, hospitalized 6/1-01/08/2022 treated with IV steroids/bronchodilators/Zithromax and empiric furosemide.  Continue with Albuterol, Brovana, Yupelri breathing treatments. Continue slow breathing, avoidance of triggers discussed reiterated. Patient stopped smoking >3 years ago. F/u with Pulmonologist next week as planned. Oxygen supplementation is ongoing. Follow up with PCP. Routine CBC BMP TSH Weakness: PT/OT is ongoing with [facility] for strengthening and gait training.  Follow up: Palliative care will continue to follow for complex medical decision making, advance care planning, and clarification of goals. Return 6 weeks or prn. Encouraged to call provider sooner with any concerns.   Family /Caregiver/Community Supports: Patient lives at home with a flat mate. His daughter helps to manage his medications. Strong family support system identified. [place].  HOSPICE ELIGIBILITY/DIAGNOSIS: TBD  Chief Complaint: Follow up visit  HISTORY OF PRESENT ILLNESS:  Cory Kemp is a 81 y.o. year old male  with multiple morbidities requiring close monitoring/management with high risk of complications and morbidity: Acute and chronic respiratory failure with hypoxia, COPD, weakness, hypertension, prediabetes.  History obtained from review of EMR, discussion with primary team, caregiver,  family and/or Mr. Eccleston.  Review and summarization of  Epic records shows history from other than patient. Rest of 10 point ROS asked and negative.  I reviewed as needed, available labs, patient records, imaging, studies and related documents from the EMR.    PAST MEDICAL HISTORY:  Active Ambulatory Problems    Diagnosis Date Noted   Diabetes mellitus ([facility]) 01/10/2012   Former smoker 03/27/2012   Leukopenia 01/29/2013   Monocytosis 01/29/2013   Normochromic anemia 01/29/2013   B12 deficiency 11/03/2013   COPD (chronic obstructive pulmonary disease) ([facility]) 01/11/2016   Tachycardia 04/11/2016   Essential hypertension 04/11/2016   Abnormal finding on EKG 01/03/2018   Chronic respiratory failure with hypoxia ([facility]) 12/13/2018   CAP (community acquired pneumonia) 12/25/2018   Elevated d-dimer 12/25/2018   COPD with acute exacerbation ([facility]) 02/08/2019   Hypertensive urgency 02/08/2019   Medication management 03/05/2019   Elevated troponin 04/20/2020   PVC's (premature ventricular contractions) 04/20/2020   Pneumonia due to COVID-19 virus 04/20/2020   Acute on chronic respiratory failure with hypoxia ([facility]) 07/11/2021   BPH (benign prostatic hyperplasia) 01/31/2013   Chronic neck pain 07/11/2021   History of malignant neoplasm of prostate 07/11/2021   Oxygen dependent 07/11/2021   Pure hypercholesterolemia 07/11/2021   SVT (supraventricular tachycardia) ([facility]) 07/12/2021   COPD exacerbation ([facility]) 01/07/2022   Resolved Ambulatory Problems    Diagnosis Date Noted   Inguinal hernia - right 01/10/2012   Incisional hernias - swiss-cheese-type periumbilical 98/33/8250   Femoral hernia, right 03/27/2012   Acute on chronic respiratory failure with hypoxia ([facility]) 12/25/2018   Acute exacerbation of chronic obstructive pulmonary disease (COPD) ([facility]) 02/11/2019   Hypoxia 04/19/2020   Acute respiratory disease due to COVID-19 virus 04/20/2020   Past Medical History:  Diagnosis Date   Alcohol abuse    Anemia    Arthritis    Asthma    BPH  (benign prostatic hypertrophy)    Dementia (HCC)    Glucose intolerance (impaired glucose tolerance)    Hyperlipidemia    Hypertension    Osteopenia     SOCIAL HX:  Social History   Tobacco Use   Smoking status: Former    Packs/day: 1.00    Years: 63.00    Total pack years: 63.00    Types: Cigarettes    Start date: 08/09/1955    Quit date: 08/08/2018    Years since quitting: 3.4   Smokeless tobacco: Never  Substance Use Topics   Alcohol use: No    Alcohol/week: 0.0 standard drinks of alcohol     FAMILY HX:  Family History  Problem Relation Age of Onset   Lung cancer Father    Lung cancer Mother    Diabetes Brother       ALLERGIES:  Allergies  Allergen Reactions   Adhesive [Tape] Rash    No "PLASTIC" tape!!! Patient broke out in blisters!!   Tegaderm Alginate Ag Rope Rash      PERTINENT MEDICATIONS:  Outpatient Encounter Medications as of 01/13/2022  Medication Sig   acetaminophen (TYLENOL) 500 MG tablet Take 1,000 mg by mouth every 6 (six) hours as needed for mild pain.   albuterol (PROVENTIL) (2.5 MG/3ML) 0.083% nebulizer solution Take 3 mLs (2.5 mg total) by nebulization every 6 (six) hours as needed for wheezing or shortness of breath.   albuterol (VENTOLIN HFA) 108 (90 Base) MCG/ACT inhaler INHALE 2 PUFFS BY MOUTH EVERY 6 HOURS AS NEEDED FOR WHEEZING OR SHORTNESS OF BREATH (Patient taking differently: Inhale 2  puffs into the lungs every 6 (six) hours as needed for wheezing or shortness of breath. INHALE 2 PUFFS BY MOUTH EVERY 6 HOURS AS NEEDED FOR WHEEZING OR SHORTNESS OF BREATH)   arformoterol (BROVANA) 15 MCG/2ML NEBU Take 2 mLs (15 mcg total) by nebulization 2 (two) times daily.   ascorbic acid (VITAMIN C) 500 MG tablet Take 1 tablet (500 mg total) by mouth daily.   azithromycin (ZITHROMAX) 500 MG tablet Take 1 tablet (500 mg total) by mouth daily.   budesonide (PULMICORT) 0.5 MG/2ML nebulizer solution Take 2 mLs (0.5 mg total) by nebulization daily.   carvedilol  (COREG) 12.5 MG tablet Take 1 tablet (12.5 mg total) by mouth 2 (two) times daily with a meal.   cholecalciferol (VITAMIN D3) 25 MCG (1000 UNIT) tablet Take 1,000 Units by mouth daily.   escitalopram (LEXAPRO) 20 MG tablet Take 20 mg by mouth daily.   fluticasone (FLONASE) 50 MCG/ACT nasal spray Place 2 sprays into both nostrils daily. Reduce to 1 spray each nostril daily after stopping Afrin   guaiFENesin (MUCINEX) 600 MG 12 hr tablet Take 1 tablet (600 mg total) by mouth 2 (two) times daily for 7 days.   loratadine (CLARITIN) 10 MG tablet Take 1 tablet (10 mg total) by mouth daily for 15 days.   montelukast (SINGULAIR) 10 MG tablet Take 10 mg by mouth daily.   OXYGEN Inhale 4 L into the lungs continuous.   predniSONE (DELTASONE) 10 MG tablet Take 4 tablets p.o. daily for 2 days, then 3 tablets p.o. daily for 2 days, then 2 tablets p.o. daily for 2 days and then resume your usual dosing of 1 tablet daily.   revefenacin (YUPELRI) 175 MCG/3ML nebulizer solution Take 3 mLs (175 mcg total) by nebulization daily.   Roflumilast 250 MCG TABS TAKE 1 TABLET BY MOUTH EVERY MORNING (Patient not taking: Reported on 01/06/2022)   simvastatin (ZOCOR) 40 MG tablet Take 40 mg by mouth every evening.   vitamin B-12 (CYANOCOBALAMIN) 500 MCG tablet Take 1,000 mcg by mouth daily.   No facility-administered encounter medications on file as of 01/13/2022.   I spent 60 minutes providing this consultation; time includes spent with patient/family, chart review and documentation. More than 50% of the time in this consultation was spent on care coordination.  Thank you for the opportunity to participate in the care of Mr. [place].  The palliative care team will continue to follow. Please call our office at [phone] if we can be of additional assistance.   Note: Portions of this note were generated with Lobbyist. Dictation errors may occur despite best attempts at proofreading.  Teodoro Spray, NP

## 2022-01-14 DIAGNOSIS — Z801 Family history of malignant neoplasm of trachea, bronchus and lung: Secondary | ICD-10-CM | POA: Diagnosis not present

## 2022-01-14 DIAGNOSIS — J302 Other seasonal allergic rhinitis: Secondary | ICD-10-CM | POA: Diagnosis not present

## 2022-01-14 DIAGNOSIS — I700 Atherosclerosis of aorta: Secondary | ICD-10-CM | POA: Diagnosis not present

## 2022-01-14 DIAGNOSIS — I2510 Atherosclerotic heart disease of native coronary artery without angina pectoris: Secondary | ICD-10-CM | POA: Diagnosis not present

## 2022-01-14 DIAGNOSIS — Z008 Encounter for other general examination: Secondary | ICD-10-CM | POA: Diagnosis not present

## 2022-01-14 DIAGNOSIS — Z7952 Long term (current) use of systemic steroids: Secondary | ICD-10-CM | POA: Diagnosis not present

## 2022-01-14 DIAGNOSIS — E785 Hyperlipidemia, unspecified: Secondary | ICD-10-CM | POA: Diagnosis not present

## 2022-01-14 DIAGNOSIS — Z7951 Long term (current) use of inhaled steroids: Secondary | ICD-10-CM | POA: Diagnosis not present

## 2022-01-14 DIAGNOSIS — I10 Essential (primary) hypertension: Secondary | ICD-10-CM | POA: Diagnosis not present

## 2022-01-14 DIAGNOSIS — R69 Illness, unspecified: Secondary | ICD-10-CM | POA: Diagnosis not present

## 2022-01-14 DIAGNOSIS — J9610 Chronic respiratory failure, unspecified whether with hypoxia or hypercapnia: Secondary | ICD-10-CM | POA: Diagnosis not present

## 2022-01-14 DIAGNOSIS — J439 Emphysema, unspecified: Secondary | ICD-10-CM | POA: Diagnosis not present

## 2022-01-17 ENCOUNTER — Telehealth: Payer: Self-pay | Admitting: Pulmonary Disease

## 2022-01-17 NOTE — Telephone Encounter (Signed)
States that pt had a hospitalization last week with lots of confusion of pulmonary meds at visit today. Hospital dicharge had pulmicort, brovana, yupelri all listed. Pharmacy states Cory Kemp never been filled through due to PA not being completed. However, they were given in the hospital. Needs to know what meds pt should be taking in terms of COPD/emphysema. States he also brought in a full trelogy inhaer that pt has not been using either. Pt has f/u on 6/26. States pt is stable and that this is not urgent. Please look over hospitalization and come up with plan so that Cory Kemp can relay this to pt.

## 2022-01-18 DIAGNOSIS — J9621 Acute and chronic respiratory failure with hypoxia: Secondary | ICD-10-CM | POA: Diagnosis not present

## 2022-01-18 DIAGNOSIS — Z604 Social exclusion and rejection: Secondary | ICD-10-CM | POA: Diagnosis not present

## 2022-01-18 DIAGNOSIS — Z7951 Long term (current) use of inhaled steroids: Secondary | ICD-10-CM | POA: Diagnosis not present

## 2022-01-18 DIAGNOSIS — D696 Thrombocytopenia, unspecified: Secondary | ICD-10-CM | POA: Diagnosis not present

## 2022-01-18 DIAGNOSIS — I471 Supraventricular tachycardia: Secondary | ICD-10-CM | POA: Diagnosis not present

## 2022-01-18 DIAGNOSIS — I5021 Acute systolic (congestive) heart failure: Secondary | ICD-10-CM | POA: Diagnosis not present

## 2022-01-18 DIAGNOSIS — E119 Type 2 diabetes mellitus without complications: Secondary | ICD-10-CM | POA: Diagnosis not present

## 2022-01-18 DIAGNOSIS — I110 Hypertensive heart disease with heart failure: Secondary | ICD-10-CM | POA: Diagnosis not present

## 2022-01-18 DIAGNOSIS — Z556 Problems related to health literacy: Secondary | ICD-10-CM | POA: Diagnosis not present

## 2022-01-18 DIAGNOSIS — E785 Hyperlipidemia, unspecified: Secondary | ICD-10-CM | POA: Diagnosis not present

## 2022-01-18 DIAGNOSIS — Z792 Long term (current) use of antibiotics: Secondary | ICD-10-CM | POA: Diagnosis not present

## 2022-01-18 DIAGNOSIS — Z8546 Personal history of malignant neoplasm of prostate: Secondary | ICD-10-CM | POA: Diagnosis not present

## 2022-01-18 DIAGNOSIS — D649 Anemia, unspecified: Secondary | ICD-10-CM | POA: Diagnosis not present

## 2022-01-18 DIAGNOSIS — Z7952 Long term (current) use of systemic steroids: Secondary | ICD-10-CM | POA: Diagnosis not present

## 2022-01-18 DIAGNOSIS — E039 Hypothyroidism, unspecified: Secondary | ICD-10-CM | POA: Diagnosis not present

## 2022-01-18 DIAGNOSIS — J441 Chronic obstructive pulmonary disease with (acute) exacerbation: Secondary | ICD-10-CM | POA: Diagnosis not present

## 2022-01-18 NOTE — Telephone Encounter (Signed)
Please look over hospital admission and advise on what patient should be doing from a pulmonary stand point.    Cory Kemp from [facility] called from [phone] and states that pt had a hospitalization last week with lots of confusion of pulmonary meds at visit today. Hospital dicharge had pulmicort, brovana, yupelri all listed. Pharmacy states [facility] never been filled through due to PA not being completed. However, they were given in the hospital. Needs to know what meds pt should be taking in terms of COPD/emphysema. States he also brought in a full trelogy inhaer that pt has not been using either. Pt has f/u on 6/26. States pt is stable and that this is not urgent. Please look over hospitalization and come up with plan so that Cory Kemp can relay this to pt.

## 2022-01-21 NOTE — Telephone Encounter (Signed)
Routing to Dr. Vaughan Browner. Please advise.

## 2022-01-25 DIAGNOSIS — D649 Anemia, unspecified: Secondary | ICD-10-CM | POA: Diagnosis not present

## 2022-01-25 DIAGNOSIS — Z8546 Personal history of malignant neoplasm of prostate: Secondary | ICD-10-CM | POA: Diagnosis not present

## 2022-01-25 DIAGNOSIS — Z7951 Long term (current) use of inhaled steroids: Secondary | ICD-10-CM | POA: Diagnosis not present

## 2022-01-25 DIAGNOSIS — E119 Type 2 diabetes mellitus without complications: Secondary | ICD-10-CM | POA: Diagnosis not present

## 2022-01-25 DIAGNOSIS — I110 Hypertensive heart disease with heart failure: Secondary | ICD-10-CM | POA: Diagnosis not present

## 2022-01-25 DIAGNOSIS — I471 Supraventricular tachycardia: Secondary | ICD-10-CM | POA: Diagnosis not present

## 2022-01-25 DIAGNOSIS — J9621 Acute and chronic respiratory failure with hypoxia: Secondary | ICD-10-CM | POA: Diagnosis not present

## 2022-01-25 DIAGNOSIS — Z7952 Long term (current) use of systemic steroids: Secondary | ICD-10-CM | POA: Diagnosis not present

## 2022-01-25 DIAGNOSIS — E785 Hyperlipidemia, unspecified: Secondary | ICD-10-CM | POA: Diagnosis not present

## 2022-01-25 DIAGNOSIS — E039 Hypothyroidism, unspecified: Secondary | ICD-10-CM | POA: Diagnosis not present

## 2022-01-25 DIAGNOSIS — J441 Chronic obstructive pulmonary disease with (acute) exacerbation: Secondary | ICD-10-CM | POA: Diagnosis not present

## 2022-01-25 DIAGNOSIS — D696 Thrombocytopenia, unspecified: Secondary | ICD-10-CM | POA: Diagnosis not present

## 2022-01-25 DIAGNOSIS — I5021 Acute systolic (congestive) heart failure: Secondary | ICD-10-CM | POA: Diagnosis not present

## 2022-01-25 DIAGNOSIS — Z604 Social exclusion and rejection: Secondary | ICD-10-CM | POA: Diagnosis not present

## 2022-01-25 DIAGNOSIS — Z556 Problems related to health literacy: Secondary | ICD-10-CM | POA: Diagnosis not present

## 2022-01-25 DIAGNOSIS — Z792 Long term (current) use of antibiotics: Secondary | ICD-10-CM | POA: Diagnosis not present

## 2022-01-25 NOTE — Telephone Encounter (Signed)
He needs to be on Brovana, Pulmicort and Yupelri nebs as trelegy is not working for him.  Can we work on getting prior Auth for these nebulizers.

## 2022-01-25 NOTE — Telephone Encounter (Signed)
Called and spoke with Cory Kemp. She verbalized understanding. I advised her that instead of sending the neb solutions to the local pharmacy, we would try [facility] as they are able to get the medications cheaper. I provided her with the contact info for [facility]. She will relay the information to the patient and the patient's daughter Cory Kemp.   Referral form will be completed and faxed to [facility].   Nothing further needed at time of call.

## 2022-01-27 DIAGNOSIS — D649 Anemia, unspecified: Secondary | ICD-10-CM | POA: Diagnosis not present

## 2022-01-27 DIAGNOSIS — E785 Hyperlipidemia, unspecified: Secondary | ICD-10-CM | POA: Diagnosis not present

## 2022-01-27 DIAGNOSIS — J441 Chronic obstructive pulmonary disease with (acute) exacerbation: Secondary | ICD-10-CM | POA: Diagnosis not present

## 2022-01-27 DIAGNOSIS — I471 Supraventricular tachycardia: Secondary | ICD-10-CM | POA: Diagnosis not present

## 2022-01-31 ENCOUNTER — Encounter: Payer: Self-pay | Admitting: Pulmonary Disease

## 2022-01-31 ENCOUNTER — Ambulatory Visit: Payer: Medicare HMO | Admitting: Pulmonary Disease

## 2022-01-31 VITALS — BP 118/58 | HR 82 | Temp 97.7°F | Ht 73.0 in | Wt 208.6 lb

## 2022-01-31 DIAGNOSIS — J9621 Acute and chronic respiratory failure with hypoxia: Secondary | ICD-10-CM

## 2022-01-31 DIAGNOSIS — J449 Chronic obstructive pulmonary disease, unspecified: Secondary | ICD-10-CM

## 2022-01-31 DIAGNOSIS — J9620 Acute and chronic respiratory failure, unspecified whether with hypoxia or hypercapnia: Secondary | ICD-10-CM | POA: Diagnosis not present

## 2022-01-31 DIAGNOSIS — Z9981 Dependence on supplemental oxygen: Secondary | ICD-10-CM | POA: Diagnosis not present

## 2022-01-31 DIAGNOSIS — Z09 Encounter for follow-up examination after completed treatment for conditions other than malignant neoplasm: Secondary | ICD-10-CM | POA: Diagnosis not present

## 2022-01-31 MED ORDER — BREZTRI AEROSPHERE 160-9-4.8 MCG/ACT IN AERO: 2.0000 | INHALATION_SPRAY | Freq: Two times a day (BID) | RESPIRATORY_TRACT | 5 refills | Status: DC

## 2022-01-31 MED ORDER — ROFLUMILAST 250 MCG PO TABS: 1.0000 | ORAL_TABLET | Freq: Every morning | ORAL | 3 refills | Status: DC

## 2022-02-01 DIAGNOSIS — I5021 Acute systolic (congestive) heart failure: Secondary | ICD-10-CM | POA: Diagnosis not present

## 2022-02-01 DIAGNOSIS — D696 Thrombocytopenia, unspecified: Secondary | ICD-10-CM | POA: Diagnosis not present

## 2022-02-01 DIAGNOSIS — Z7951 Long term (current) use of inhaled steroids: Secondary | ICD-10-CM | POA: Diagnosis not present

## 2022-02-01 DIAGNOSIS — Z8546 Personal history of malignant neoplasm of prostate: Secondary | ICD-10-CM | POA: Diagnosis not present

## 2022-02-01 DIAGNOSIS — Z556 Problems related to health literacy: Secondary | ICD-10-CM | POA: Diagnosis not present

## 2022-02-01 DIAGNOSIS — Z792 Long term (current) use of antibiotics: Secondary | ICD-10-CM | POA: Diagnosis not present

## 2022-02-01 DIAGNOSIS — E039 Hypothyroidism, unspecified: Secondary | ICD-10-CM | POA: Diagnosis not present

## 2022-02-01 DIAGNOSIS — J9621 Acute and chronic respiratory failure with hypoxia: Secondary | ICD-10-CM | POA: Diagnosis not present

## 2022-02-01 DIAGNOSIS — E785 Hyperlipidemia, unspecified: Secondary | ICD-10-CM | POA: Diagnosis not present

## 2022-02-01 DIAGNOSIS — E119 Type 2 diabetes mellitus without complications: Secondary | ICD-10-CM | POA: Diagnosis not present

## 2022-02-01 DIAGNOSIS — J441 Chronic obstructive pulmonary disease with (acute) exacerbation: Secondary | ICD-10-CM | POA: Diagnosis not present

## 2022-02-01 DIAGNOSIS — Z604 Social exclusion and rejection: Secondary | ICD-10-CM | POA: Diagnosis not present

## 2022-02-01 DIAGNOSIS — Z7952 Long term (current) use of systemic steroids: Secondary | ICD-10-CM | POA: Diagnosis not present

## 2022-02-01 DIAGNOSIS — I110 Hypertensive heart disease with heart failure: Secondary | ICD-10-CM | POA: Diagnosis not present

## 2022-02-01 DIAGNOSIS — I471 Supraventricular tachycardia: Secondary | ICD-10-CM | POA: Diagnosis not present

## 2022-02-01 DIAGNOSIS — D649 Anemia, unspecified: Secondary | ICD-10-CM | POA: Diagnosis not present

## 2022-02-03 ENCOUNTER — Other Ambulatory Visit: Payer: Medicare Other | Admitting: Hospice

## 2022-02-03 DIAGNOSIS — R531 Weakness: Secondary | ICD-10-CM

## 2022-02-03 DIAGNOSIS — J449 Chronic obstructive pulmonary disease, unspecified: Secondary | ICD-10-CM | POA: Diagnosis not present

## 2022-02-03 DIAGNOSIS — Z515 Encounter for palliative care: Secondary | ICD-10-CM | POA: Diagnosis not present

## 2022-02-03 DIAGNOSIS — J9611 Chronic respiratory failure with hypoxia: Secondary | ICD-10-CM

## 2022-02-03 NOTE — Progress Notes (Signed)
[facility] Consult Note Telephone: [phone]  Fax: [phone]  PATIENT NAME: Cory Kemp [address] [place] [address] [place] [address] [phone] (home)  DOB: 1940/10/17 MRN: [id number]  PRIMARY CARE PROVIDER:    Jani Gravel, MD,  [address] [place] [address] [phone]  REFERRING PROVIDER:   Jani Kemp, [place],  [place] [address] [place], with same practice  RESPONSIBLE PARTY:   Self/Kemp Patient [phone] best to call Contact Information     Name Relation Home Work Mobile   CoryBeth Daughter   [phone]   Cory Kemp   [phone]     TELEHEALTH VISIT STATEMENT Due to the COVID-19 crisis, this visit was done via telemedicine from my office and it was initiated and consent by this patient and or family.  I connected with patient OR PROXY by a telephone/video  and verified that I am speaking with the correct person. I discussed the limitations of evaluation and management by telemedicine. Patient/proxy expressed understanding and agreed to proceed. Palliative Care was asked to follow this patient to address advance care planning, complex medical decision making and goals of care clarification.   NP called Kemp and left her a voicemail with callback number.    ASSESSMENT AND / RECOMMENDATIONS:   CODE STATUS: Patient elects to be a Partial code; wishes to have chest compressions and ACLS medications, does not want mechanical ventilation/life support.  Goals of Care: Goals include to maximize quality of life and symptom management.  Patient is open to hospice service in the future if he continues to decline in functional status.   Visit consisted of counseling and education dealing with the complex and emotionally intense issues of symptom management and palliative care in the setting of serious and potentially life-threatening  illness.  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 His strong spiritual values and practices which are his main anchor supporting him through his health challenges.  Therapeutic listening and ample emotional support provided. Palliative care team will continue to support patient, patient's family, and medical team. Symptom Management/Plan: Chronic respiratory failure with hypoxia: related to COPD exacerbation, he was hospitalized 6/1 - 01/08/2022 for acute on chronic, treated and discharged home.  Reports he is feeling and breathing better. Continue oxygen supplementation as ordered.  Balance between rest and performance.   COPD: Recent exacerbation for which he was hospitalized 6/1-01/08/2022 treated with IV steroids/bronchodilators/Zithromax and empiric furosemide.  No exacerbations since then.  Continue with Albuterol, Virgilio Frees, Yupelri breathing treatments. Continue slow breathing, avoidance of triggers discussed reiterated. Patient stopped smoking >3 years ago. F/u with Pulmonologist next week as planned. Oxygen supplementation is ongoing.  Weakness: PT/OT is ongoing with [facility] for strengthening and gait training. Follow up with PCP. Routine CBC BMP TSH. Follow up: Palliative care will continue to follow for complex medical decision making, advance care planning, and clarification of goals. Return 6 weeks or prn. Encouraged to call provider sooner with any concerns.   Family /Caregiver/Community Supports: Patient lives at home with a flat mate. His daughter helps to manage his medications. Strong family support system identified. [place] - light duty, drive only.  HOSPICE ELIGIBILITY/DIAGNOSIS: TBD  Chief Complaint: Follow up  visit  HISTORY OF PRESENT ILLNESS:  Cory Kemp is a 81 y.o. year old male  with multiple morbidities requiring close monitoring/management with high risk of complications and morbidity: chronic respiratory failure with hypoxia, COPD, weakness, hypertension, prediabetes.  History obtained from review of EMR, discussion with primary team, caregiver, family and/or Cory Kemp.  Review and summarization of Epic records shows history from other than patient. Rest of 10 point ROS asked and negative.  I reviewed as needed, available labs, patient records, imaging, studies and related documents from the EMR.    PAST MEDICAL HISTORY:  Active Ambulatory Problems    Diagnosis Date Noted   Diabetes mellitus ([facility]) 01/10/2012   Former smoker 03/27/2012   Leukopenia 01/29/2013   Monocytosis 01/29/2013   Normochromic anemia 01/29/2013   B12 deficiency 11/03/2013   COPD (chronic obstructive pulmonary disease) ([facility]) 01/11/2016   Tachycardia 04/11/2016   Essential hypertension 04/11/2016   Abnormal finding on EKG 01/03/2018   Chronic respiratory failure with hypoxia ([facility]) 12/13/2018   CAP (community acquired pneumonia) 12/25/2018   Elevated d-dimer 12/25/2018   COPD with acute exacerbation ([facility]) 02/08/2019   Hypertensive urgency 02/08/2019   Medication management 03/05/2019   Elevated troponin 04/20/2020   PVC's (premature ventricular contractions) 04/20/2020   Pneumonia due to COVID-19 virus 04/20/2020   Acute on chronic respiratory failure with hypoxia ([facility]) 07/11/2021   BPH (benign prostatic hyperplasia) 01/31/2013   Chronic neck pain 07/11/2021   History of malignant neoplasm of prostate 07/11/2021   Oxygen dependent 07/11/2021   Pure hypercholesterolemia 07/11/2021   SVT (supraventricular tachycardia) ([facility]) 07/12/2021   COPD exacerbation ([facility]) 01/07/2022   Resolved Ambulatory Problems    Diagnosis Date Noted   Inguinal hernia - right 01/10/2012   Incisional hernias - swiss-cheese-type  periumbilical 29/92/4268   Femoral hernia, right 03/27/2012   Acute on chronic respiratory failure with hypoxia ([facility]) 12/25/2018   Acute exacerbation of chronic obstructive pulmonary disease (COPD) ([place]) 02/11/2019   Hypoxia 04/19/2020   Acute respiratory disease due to COVID-19 virus 04/20/2020   Past Medical History:  Diagnosis Date   Alcohol abuse    Anemia    Arthritis    Asthma    BPH (benign prostatic hypertrophy)    Dementia (HCC)    Glucose intolerance (impaired glucose tolerance)    Hyperlipidemia    Hypertension    Osteopenia     SOCIAL HX:  Social History   Tobacco Use   Smoking status: Former    Packs/day: 1.00    Years: 63.00    Total pack years: 63.00    Types: Cigarettes    Start date: 08/09/1955    Quit date: 08/08/2018    Years since quitting: 3.4   Smokeless tobacco: Never  Substance Use Topics   Alcohol use: No    Alcohol/week: 0.0 standard drinks of alcohol     FAMILY HX:  Family History  Problem Relation Age of Onset   Lung cancer Father    Lung cancer Mother    Diabetes Brother       ALLERGIES:  Allergies  Allergen Reactions   Adhesive [Tape] Rash    No "PLASTIC" tape!!! Patient broke out in blisters!!   Tegaderm Alginate Ag Rope Rash      PERTINENT MEDICATIONS:  Outpatient Encounter Medications as of 02/03/2022  Medication Sig   acetaminophen (TYLENOL) 500 MG tablet Take 1,000 mg by mouth every 6 (six) hours as needed for mild pain.  albuterol (PROVENTIL) (2.5 MG/3ML) 0.083% nebulizer solution Take 3 mLs (2.5 mg total) by nebulization every 6 (six) hours as needed for wheezing or shortness of breath.   albuterol (VENTOLIN HFA) 108 (90 Base) MCG/ACT inhaler INHALE 2 PUFFS BY MOUTH EVERY 6 HOURS AS NEEDED FOR WHEEZING OR SHORTNESS OF BREATH (Patient taking differently: Inhale 2 puffs into the lungs every 6 (six) hours as needed for wheezing or shortness of breath. INHALE 2 PUFFS BY MOUTH EVERY 6 HOURS AS NEEDED FOR WHEEZING OR SHORTNESS OF  BREATH)   arformoterol (BROVANA) 15 MCG/2ML NEBU Take 2 mLs (15 mcg total) by nebulization 2 (two) times daily. (Patient not taking: Reported on 01/31/2022)   ascorbic acid (VITAMIN C) 500 MG tablet Take 1 tablet (500 mg total) by mouth daily.   azithromycin (ZITHROMAX) 500 MG tablet Take 1 tablet (500 mg total) by mouth daily.   Budeson-Glycopyrrol-Formoterol (BREZTRI AEROSPHERE) 160-9-4.8 MCG/ACT AERO Inhale 2 puffs into the lungs in the morning and at bedtime.   budesonide (PULMICORT) 0.5 MG/2ML nebulizer solution Take 2 mLs (0.5 mg total) by nebulization daily. (Patient not taking: Reported on 01/31/2022)   carvedilol (COREG) 12.5 MG tablet Take 1 tablet (12.5 mg total) by mouth 2 (two) times daily with a meal.   cholecalciferol (VITAMIN D3) 25 MCG (1000 UNIT) tablet Take 1,000 Units by mouth daily.   escitalopram (LEXAPRO) 20 MG tablet Take 20 mg by mouth daily.   fluticasone (FLONASE) 50 MCG/ACT nasal spray Place 2 sprays into both nostrils daily. Reduce to 1 spray each nostril daily after stopping Afrin   loratadine (CLARITIN) 10 MG tablet Take 1 tablet (10 mg total) by mouth daily for 15 days.   montelukast (SINGULAIR) 10 MG tablet Take 10 mg by mouth daily.   OXYGEN Inhale 4 L into the lungs continuous.   revefenacin (YUPELRI) 175 MCG/3ML nebulizer solution Take 3 mLs (175 mcg total) by nebulization daily. (Patient not taking: Reported on 01/31/2022)   Roflumilast 250 MCG TABS Take 1 tablet by mouth every morning.   simvastatin (ZOCOR) 40 MG tablet Take 40 mg by mouth every evening.   vitamin B-12 (CYANOCOBALAMIN) 500 MCG tablet Take 1,000 mcg by mouth daily.   No facility-administered encounter medications on file as of 02/03/2022.   I spent 40 minutes providing this consultation; time includes spent with patient/family, chart review and documentation. More than 50% of the time in this consultation was spent on care coordination.  Thank you for the opportunity to participate in the care  of Mr. Freels.  The palliative care team will continue to follow. Please call our office at [phone] if we can be of additional assistance.   Note: Portions of this note were generated with Lobbyist. Dictation errors may occur despite best attempts at proofreading.  Teodoro Spray, NP

## 2022-02-04 DIAGNOSIS — J441 Chronic obstructive pulmonary disease with (acute) exacerbation: Secondary | ICD-10-CM | POA: Diagnosis not present

## 2022-02-04 DIAGNOSIS — J449 Chronic obstructive pulmonary disease, unspecified: Secondary | ICD-10-CM | POA: Diagnosis not present

## 2022-02-04 DIAGNOSIS — J9620 Acute and chronic respiratory failure, unspecified whether with hypoxia or hypercapnia: Secondary | ICD-10-CM | POA: Diagnosis not present

## 2022-02-04 DIAGNOSIS — I10 Essential (primary) hypertension: Secondary | ICD-10-CM | POA: Diagnosis not present

## 2022-02-10 DIAGNOSIS — E039 Hypothyroidism, unspecified: Secondary | ICD-10-CM | POA: Diagnosis not present

## 2022-02-10 DIAGNOSIS — J441 Chronic obstructive pulmonary disease with (acute) exacerbation: Secondary | ICD-10-CM | POA: Diagnosis not present

## 2022-02-10 DIAGNOSIS — Z7952 Long term (current) use of systemic steroids: Secondary | ICD-10-CM | POA: Diagnosis not present

## 2022-02-10 DIAGNOSIS — Z556 Problems related to health literacy: Secondary | ICD-10-CM | POA: Diagnosis not present

## 2022-02-10 DIAGNOSIS — Z8546 Personal history of malignant neoplasm of prostate: Secondary | ICD-10-CM | POA: Diagnosis not present

## 2022-02-10 DIAGNOSIS — Z792 Long term (current) use of antibiotics: Secondary | ICD-10-CM | POA: Diagnosis not present

## 2022-02-10 DIAGNOSIS — I110 Hypertensive heart disease with heart failure: Secondary | ICD-10-CM | POA: Diagnosis not present

## 2022-02-10 DIAGNOSIS — I471 Supraventricular tachycardia: Secondary | ICD-10-CM | POA: Diagnosis not present

## 2022-02-10 DIAGNOSIS — Z604 Social exclusion and rejection: Secondary | ICD-10-CM | POA: Diagnosis not present

## 2022-02-10 DIAGNOSIS — J9621 Acute and chronic respiratory failure with hypoxia: Secondary | ICD-10-CM | POA: Diagnosis not present

## 2022-02-10 DIAGNOSIS — D649 Anemia, unspecified: Secondary | ICD-10-CM | POA: Diagnosis not present

## 2022-02-10 DIAGNOSIS — E119 Type 2 diabetes mellitus without complications: Secondary | ICD-10-CM | POA: Diagnosis not present

## 2022-02-10 DIAGNOSIS — I5021 Acute systolic (congestive) heart failure: Secondary | ICD-10-CM | POA: Diagnosis not present

## 2022-02-10 DIAGNOSIS — E785 Hyperlipidemia, unspecified: Secondary | ICD-10-CM | POA: Diagnosis not present

## 2022-02-10 DIAGNOSIS — Z7951 Long term (current) use of inhaled steroids: Secondary | ICD-10-CM | POA: Diagnosis not present

## 2022-02-10 DIAGNOSIS — D696 Thrombocytopenia, unspecified: Secondary | ICD-10-CM | POA: Diagnosis not present

## 2022-02-14 DIAGNOSIS — D649 Anemia, unspecified: Secondary | ICD-10-CM | POA: Diagnosis not present

## 2022-02-14 DIAGNOSIS — J441 Chronic obstructive pulmonary disease with (acute) exacerbation: Secondary | ICD-10-CM | POA: Diagnosis not present

## 2022-02-14 DIAGNOSIS — Z792 Long term (current) use of antibiotics: Secondary | ICD-10-CM | POA: Diagnosis not present

## 2022-02-14 DIAGNOSIS — I5021 Acute systolic (congestive) heart failure: Secondary | ICD-10-CM | POA: Diagnosis not present

## 2022-02-14 DIAGNOSIS — J9621 Acute and chronic respiratory failure with hypoxia: Secondary | ICD-10-CM | POA: Diagnosis not present

## 2022-02-14 DIAGNOSIS — I471 Supraventricular tachycardia: Secondary | ICD-10-CM | POA: Diagnosis not present

## 2022-02-14 DIAGNOSIS — D696 Thrombocytopenia, unspecified: Secondary | ICD-10-CM | POA: Diagnosis not present

## 2022-02-14 DIAGNOSIS — E119 Type 2 diabetes mellitus without complications: Secondary | ICD-10-CM | POA: Diagnosis not present

## 2022-02-14 DIAGNOSIS — Z556 Problems related to health literacy: Secondary | ICD-10-CM | POA: Diagnosis not present

## 2022-02-14 DIAGNOSIS — E785 Hyperlipidemia, unspecified: Secondary | ICD-10-CM | POA: Diagnosis not present

## 2022-02-14 DIAGNOSIS — Z7951 Long term (current) use of inhaled steroids: Secondary | ICD-10-CM | POA: Diagnosis not present

## 2022-02-14 DIAGNOSIS — I110 Hypertensive heart disease with heart failure: Secondary | ICD-10-CM | POA: Diagnosis not present

## 2022-02-14 DIAGNOSIS — Z604 Social exclusion and rejection: Secondary | ICD-10-CM | POA: Diagnosis not present

## 2022-02-14 DIAGNOSIS — Z7952 Long term (current) use of systemic steroids: Secondary | ICD-10-CM | POA: Diagnosis not present

## 2022-02-14 DIAGNOSIS — E039 Hypothyroidism, unspecified: Secondary | ICD-10-CM | POA: Diagnosis not present

## 2022-02-14 DIAGNOSIS — Z8546 Personal history of malignant neoplasm of prostate: Secondary | ICD-10-CM | POA: Diagnosis not present

## 2022-02-21 DIAGNOSIS — E785 Hyperlipidemia, unspecified: Secondary | ICD-10-CM | POA: Diagnosis not present

## 2022-02-21 DIAGNOSIS — I471 Supraventricular tachycardia: Secondary | ICD-10-CM | POA: Diagnosis not present

## 2022-02-21 DIAGNOSIS — I110 Hypertensive heart disease with heart failure: Secondary | ICD-10-CM | POA: Diagnosis not present

## 2022-02-21 DIAGNOSIS — Z7952 Long term (current) use of systemic steroids: Secondary | ICD-10-CM | POA: Diagnosis not present

## 2022-02-21 DIAGNOSIS — Z792 Long term (current) use of antibiotics: Secondary | ICD-10-CM | POA: Diagnosis not present

## 2022-02-21 DIAGNOSIS — J9621 Acute and chronic respiratory failure with hypoxia: Secondary | ICD-10-CM | POA: Diagnosis not present

## 2022-02-21 DIAGNOSIS — Z604 Social exclusion and rejection: Secondary | ICD-10-CM | POA: Diagnosis not present

## 2022-02-21 DIAGNOSIS — J441 Chronic obstructive pulmonary disease with (acute) exacerbation: Secondary | ICD-10-CM | POA: Diagnosis not present

## 2022-02-21 DIAGNOSIS — I5021 Acute systolic (congestive) heart failure: Secondary | ICD-10-CM | POA: Diagnosis not present

## 2022-02-21 DIAGNOSIS — E119 Type 2 diabetes mellitus without complications: Secondary | ICD-10-CM | POA: Diagnosis not present

## 2022-02-21 DIAGNOSIS — Z7951 Long term (current) use of inhaled steroids: Secondary | ICD-10-CM | POA: Diagnosis not present

## 2022-02-21 DIAGNOSIS — D649 Anemia, unspecified: Secondary | ICD-10-CM | POA: Diagnosis not present

## 2022-02-21 DIAGNOSIS — D696 Thrombocytopenia, unspecified: Secondary | ICD-10-CM | POA: Diagnosis not present

## 2022-02-21 DIAGNOSIS — E039 Hypothyroidism, unspecified: Secondary | ICD-10-CM | POA: Diagnosis not present

## 2022-02-21 DIAGNOSIS — Z8546 Personal history of malignant neoplasm of prostate: Secondary | ICD-10-CM | POA: Diagnosis not present

## 2022-02-21 DIAGNOSIS — Z556 Problems related to health literacy: Secondary | ICD-10-CM | POA: Diagnosis not present

## 2022-02-28 DIAGNOSIS — Z7952 Long term (current) use of systemic steroids: Secondary | ICD-10-CM | POA: Diagnosis not present

## 2022-02-28 DIAGNOSIS — Z7951 Long term (current) use of inhaled steroids: Secondary | ICD-10-CM | POA: Diagnosis not present

## 2022-02-28 DIAGNOSIS — E119 Type 2 diabetes mellitus without complications: Secondary | ICD-10-CM | POA: Diagnosis not present

## 2022-02-28 DIAGNOSIS — D696 Thrombocytopenia, unspecified: Secondary | ICD-10-CM | POA: Diagnosis not present

## 2022-02-28 DIAGNOSIS — Z8546 Personal history of malignant neoplasm of prostate: Secondary | ICD-10-CM | POA: Diagnosis not present

## 2022-02-28 DIAGNOSIS — I5021 Acute systolic (congestive) heart failure: Secondary | ICD-10-CM | POA: Diagnosis not present

## 2022-02-28 DIAGNOSIS — E039 Hypothyroidism, unspecified: Secondary | ICD-10-CM | POA: Diagnosis not present

## 2022-02-28 DIAGNOSIS — J9621 Acute and chronic respiratory failure with hypoxia: Secondary | ICD-10-CM | POA: Diagnosis not present

## 2022-02-28 DIAGNOSIS — Z556 Problems related to health literacy: Secondary | ICD-10-CM | POA: Diagnosis not present

## 2022-02-28 DIAGNOSIS — E785 Hyperlipidemia, unspecified: Secondary | ICD-10-CM | POA: Diagnosis not present

## 2022-02-28 DIAGNOSIS — I471 Supraventricular tachycardia: Secondary | ICD-10-CM | POA: Diagnosis not present

## 2022-02-28 DIAGNOSIS — J441 Chronic obstructive pulmonary disease with (acute) exacerbation: Secondary | ICD-10-CM | POA: Diagnosis not present

## 2022-02-28 DIAGNOSIS — Z604 Social exclusion and rejection: Secondary | ICD-10-CM | POA: Diagnosis not present

## 2022-02-28 DIAGNOSIS — D649 Anemia, unspecified: Secondary | ICD-10-CM | POA: Diagnosis not present

## 2022-02-28 DIAGNOSIS — Z792 Long term (current) use of antibiotics: Secondary | ICD-10-CM | POA: Diagnosis not present

## 2022-02-28 DIAGNOSIS — I110 Hypertensive heart disease with heart failure: Secondary | ICD-10-CM | POA: Diagnosis not present

## 2022-03-07 DIAGNOSIS — J441 Chronic obstructive pulmonary disease with (acute) exacerbation: Secondary | ICD-10-CM | POA: Diagnosis not present

## 2022-03-07 DIAGNOSIS — J449 Chronic obstructive pulmonary disease, unspecified: Secondary | ICD-10-CM | POA: Diagnosis not present

## 2022-03-10 DIAGNOSIS — Z7952 Long term (current) use of systemic steroids: Secondary | ICD-10-CM | POA: Diagnosis not present

## 2022-03-10 DIAGNOSIS — J441 Chronic obstructive pulmonary disease with (acute) exacerbation: Secondary | ICD-10-CM | POA: Diagnosis not present

## 2022-03-10 DIAGNOSIS — Z604 Social exclusion and rejection: Secondary | ICD-10-CM | POA: Diagnosis not present

## 2022-03-10 DIAGNOSIS — E039 Hypothyroidism, unspecified: Secondary | ICD-10-CM | POA: Diagnosis not present

## 2022-03-10 DIAGNOSIS — Z8546 Personal history of malignant neoplasm of prostate: Secondary | ICD-10-CM | POA: Diagnosis not present

## 2022-03-10 DIAGNOSIS — I5021 Acute systolic (congestive) heart failure: Secondary | ICD-10-CM | POA: Diagnosis not present

## 2022-03-10 DIAGNOSIS — I471 Supraventricular tachycardia: Secondary | ICD-10-CM | POA: Diagnosis not present

## 2022-03-10 DIAGNOSIS — D696 Thrombocytopenia, unspecified: Secondary | ICD-10-CM | POA: Diagnosis not present

## 2022-03-10 DIAGNOSIS — D649 Anemia, unspecified: Secondary | ICD-10-CM | POA: Diagnosis not present

## 2022-03-10 DIAGNOSIS — Z7951 Long term (current) use of inhaled steroids: Secondary | ICD-10-CM | POA: Diagnosis not present

## 2022-03-10 DIAGNOSIS — Z792 Long term (current) use of antibiotics: Secondary | ICD-10-CM | POA: Diagnosis not present

## 2022-03-10 DIAGNOSIS — Z556 Problems related to health literacy: Secondary | ICD-10-CM | POA: Diagnosis not present

## 2022-03-10 DIAGNOSIS — I110 Hypertensive heart disease with heart failure: Secondary | ICD-10-CM | POA: Diagnosis not present

## 2022-03-10 DIAGNOSIS — E119 Type 2 diabetes mellitus without complications: Secondary | ICD-10-CM | POA: Diagnosis not present

## 2022-03-10 DIAGNOSIS — J9621 Acute and chronic respiratory failure with hypoxia: Secondary | ICD-10-CM | POA: Diagnosis not present

## 2022-03-10 DIAGNOSIS — E785 Hyperlipidemia, unspecified: Secondary | ICD-10-CM | POA: Diagnosis not present

## 2022-03-19 DIAGNOSIS — J441 Chronic obstructive pulmonary disease with (acute) exacerbation: Secondary | ICD-10-CM | POA: Diagnosis not present

## 2022-03-21 ENCOUNTER — Other Ambulatory Visit: Payer: Medicare Other | Admitting: Hospice

## 2022-03-21 DIAGNOSIS — Z515 Encounter for palliative care: Secondary | ICD-10-CM

## 2022-03-21 DIAGNOSIS — R531 Weakness: Secondary | ICD-10-CM | POA: Diagnosis not present

## 2022-03-21 DIAGNOSIS — J449 Chronic obstructive pulmonary disease, unspecified: Secondary | ICD-10-CM | POA: Diagnosis not present

## 2022-03-21 DIAGNOSIS — J9611 Chronic respiratory failure with hypoxia: Secondary | ICD-10-CM

## 2022-03-21 NOTE — Progress Notes (Signed)
[facility] Consult Note Telephone: [phone]  Fax: [phone]  PATIENT NAME: Cory Kemp [address] [place] [address] [place] [address] [phone] (home)  DOB: [birth year] MRN: [id number]  PRIMARY CARE PROVIDER:    Jani Gravel, MD,  [address] [place] [address] [phone]  REFERRING PROVIDER:   Jani Kemp, [place],  [place] [address] [place], with same practice  RESPONSIBLE PARTY:   Self/Cory Kemp Patient [phone] best to call Contact Information     Name Relation Home Work Mobile   Pate,Cory Kemp Daughter   [phone]   [place], [place]   [phone]     TELEHEALTH VISIT STATEMENT Due to the COVID-19 crisis, this visit was done via telemedicine from my office and it was initiated and consent by this patient and or family.  I connected with patient OR PROXY by a telephone/video  and verified that I am speaking with the correct person. I discussed the limitations of evaluation and management by telemedicine. Patient/proxy expressed understanding and agreed to proceed. Palliative Care was asked to follow this patient to address advance care planning, complex medical decision making and goals of care clarification.   NP called Cory Kemp and updated her on visit.  Considering patient's ongoing decline in functional status related to his worsening COPD, Cory Kemp wanted to know if getting patient on partial Medicaid and food stamps will affect his hospice coverage.  It was agreed that Cory Kemp social worker will discuss further with Cory Kemp on 04/05/2022 to provide needed guidance/resources. Cory Kemp SW informed of request/need today. Discussion on the prospect of hospice service for patient in the future.     ASSESSMENT AND / RECOMMENDATIONS:   CODE STATUS: Patient elects to be a Partial code; wishes to have chest compressions and ACLS medications, does not want  mechanical ventilation/life support.  Goals of Care: Goals include to maximize quality of life and symptom management.  Patient is open to hospice service in the future if he continues to decline in functional status.    Visit consisted of counseling and education dealing with the complex and emotionally intense issues of symptom management and palliative care in the setting of serious and potentially life-threatening illness.  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   Palliative care team will continue to support patient, patient's family, and medical team. Symptom Management/Plan: Chronic respiratory failure with hypoxia: Continue continuous oxygen supplementation as ordered. Balance between rest and performance.  COPD: Worsening dyspnea on exertion. Continue oxygen supplementation, Albuterol, Virgilio Frees, Yupelri breathing treatments. Continue slow breathing, avoidance of triggers discussed reiterated. Patient stopped smoking >3 years ago. F/u with Pulmonologist next week as planned.  Weakness: Related to chronic respiratory failure with hypoxia and worsening COPD.  Completed PT OT.  Continue physical activity as tolerated, breaks in between activities, breaks in between activities.  Balance of rest and performance activity.   Follow up: Palliative care will continue to follow for complex medical decision making, advance care planning, and clarification of goals. Return 6 weeks or prn. Encouraged to call provider sooner with any concerns.   Family /Caregiver/Community Supports: Patient lives at home with a flat mate. His daughter helps to manage his medications. Strong family support  system identified.  HOSPICE ELIGIBILITY/DIAGNOSIS: TBD  Chief Complaint: Follow up visit  HISTORY OF  PRESENT ILLNESS:  Cory Kemp is a 81 y.o. year old male  with multiple morbidities requiring close monitoring/management with high risk of complications and morbidity: chronic respiratory failure with hypoxia, COPD, weakness, hypertension, prediabetes.  History obtained from review of EMR, discussion with primary team, caregiver, family and/or [name].  Review and summarization of Epic records shows history from other than patient. Rest of 10 point ROS asked and negative.  I reviewed as needed, available labs, patient records, imaging, studies and related documents from the EMR.    PAST MEDICAL HISTORY:  Active Ambulatory Problems    Diagnosis Date Noted   Diabetes mellitus ([facility]) 01/10/2012   Former smoker 03/27/2012   Leukopenia 01/29/2013   Monocytosis 01/29/2013   Normochromic anemia 01/29/2013   B12 deficiency 11/03/2013   COPD (chronic obstructive pulmonary disease) ([facility]) 01/11/2016   Tachycardia 04/11/2016   Essential hypertension 04/11/2016   Abnormal finding on EKG 01/03/2018   Chronic respiratory failure with hypoxia ([facility]) 12/13/2018   CAP (community acquired pneumonia) 12/25/2018   Elevated d-dimer 12/25/2018   COPD with acute exacerbation ([facility]) 02/08/2019   Hypertensive urgency 02/08/2019   Medication management 03/05/2019   Elevated troponin 04/20/2020   PVC's (premature ventricular contractions) 04/20/2020   Pneumonia due to COVID-19 virus 04/20/2020   Acute on chronic respiratory failure with hypoxia ([facility]) 07/11/2021   BPH (benign prostatic hyperplasia) 01/31/2013   Chronic neck pain 07/11/2021   History of malignant neoplasm of prostate 07/11/2021   Oxygen dependent 07/11/2021   Pure hypercholesterolemia 07/11/2021   SVT (supraventricular tachycardia) ([facility]) 07/12/2021   COPD exacerbation ([facility]) 01/07/2022   Resolved Ambulatory Problems    Diagnosis Date Noted   Inguinal hernia - right 01/10/2012   Incisional hernias - swiss-cheese-type periumbilical  84/69/6295   Femoral hernia, right 03/27/2012   Acute on chronic respiratory failure with hypoxia ([facility]) 12/25/2018   Acute exacerbation of chronic obstructive pulmonary disease (COPD) ([facility]) 02/11/2019   Hypoxia 04/19/2020   Acute respiratory disease due to COVID-19 virus 04/20/2020   Past Medical History:  Diagnosis Date   Alcohol abuse    Anemia    Arthritis    Asthma    BPH (benign prostatic hypertrophy)    Dementia (HCC)    Glucose intolerance (impaired glucose tolerance)    Hyperlipidemia    Hypertension    Osteopenia     SOCIAL HX:  Social History   Tobacco Use   Smoking status: Former    Packs/day: 1.00    Years: 63.00    Total pack years: 63.00    Types: Cigarettes    Start date: 08/09/1955    Quit date: 08/08/2018    Years since quitting: 3.6   Smokeless tobacco: Never  Substance Use Topics   Alcohol use: No    Alcohol/week: 0.0 standard drinks of alcohol     FAMILY HX:  Family History  Problem Relation Age of Onset   Lung cancer Father    Lung cancer Mother    Diabetes Brother       ALLERGIES:  Allergies  Allergen Reactions   Adhesive [Tape] Rash    No "PLASTIC" tape!!! Patient broke out in blisters!!   Tegaderm Alginate Ag Rope Rash      PERTINENT MEDICATIONS:  Outpatient Encounter Medications as of 03/21/2022  Medication Sig   acetaminophen (TYLENOL) 500 MG tablet Take 1,000 mg by mouth every 6 (six) hours as needed  for mild pain.   albuterol (PROVENTIL) (2.5 MG/3ML) 0.083% nebulizer solution Take 3 mLs (2.5 mg total) by nebulization every 6 (six) hours as needed for wheezing or shortness of breath.   albuterol (VENTOLIN HFA) 108 (90 Base) MCG/ACT inhaler INHALE 2 PUFFS BY MOUTH EVERY 6 HOURS AS NEEDED FOR WHEEZING OR SHORTNESS OF BREATH (Patient taking differently: Inhale 2 puffs into the lungs every 6 (six) hours as needed for wheezing or shortness of breath. INHALE 2 PUFFS BY MOUTH EVERY 6 HOURS AS NEEDED FOR WHEEZING OR SHORTNESS OF BREATH)    arformoterol (BROVANA) 15 MCG/2ML NEBU Take 2 mLs (15 mcg total) by nebulization 2 (two) times daily. (Patient not taking: Reported on 01/31/2022)   ascorbic acid (VITAMIN C) 500 MG tablet Take 1 tablet (500 mg total) by mouth daily.   azithromycin (ZITHROMAX) 500 MG tablet Take 1 tablet (500 mg total) by mouth daily.   Budeson-Glycopyrrol-Formoterol (BREZTRI AEROSPHERE) 160-9-4.8 MCG/ACT AERO Inhale 2 puffs into the lungs in the morning and at bedtime.   budesonide (PULMICORT) 0.5 MG/2ML nebulizer solution Take 2 mLs (0.5 mg total) by nebulization daily. (Patient not taking: Reported on 01/31/2022)   carvedilol (COREG) 12.5 MG tablet Take 1 tablet (12.5 mg total) by mouth 2 (two) times daily with a meal.   cholecalciferol (VITAMIN D3) 25 MCG (1000 UNIT) tablet Take 1,000 Units by mouth daily.   escitalopram (LEXAPRO) 20 MG tablet Take 20 mg by mouth daily.   fluticasone (FLONASE) 50 MCG/ACT nasal spray Place 2 sprays into both nostrils daily. Reduce to 1 spray each nostril daily after stopping Afrin   loratadine (CLARITIN) 10 MG tablet Take 1 tablet (10 mg total) by mouth daily for 15 days.   montelukast (SINGULAIR) 10 MG tablet Take 10 mg by mouth daily.   OXYGEN Inhale 4 L into the lungs continuous.   revefenacin (YUPELRI) 175 MCG/3ML nebulizer solution Take 3 mLs (175 mcg total) by nebulization daily. (Patient not taking: Reported on 01/31/2022)   Roflumilast 250 MCG TABS Take 1 tablet by mouth every morning.   simvastatin (ZOCOR) 40 MG tablet Take 40 mg by mouth every evening.   vitamin B-12 (CYANOCOBALAMIN) 500 MCG tablet Take 1,000 mcg by mouth daily.   No facility-administered encounter medications on file as of 03/21/2022.   I spent 40 minutes providing this consultation; time includes spent with patient/family, chart review and documentation. More than 50% of the time in this consultation was spent on care coordination.  Thank you for the opportunity to participate in the care of Mr.  Go.  The palliative care team will continue to follow. Please call our office at [phone] if we can be of additional assistance.   Note: Portions of this note were generated with Lobbyist. Dictation errors may occur despite best attempts at proofreading.  Teodoro Spray, NP

## 2022-04-07 DIAGNOSIS — J441 Chronic obstructive pulmonary disease with (acute) exacerbation: Secondary | ICD-10-CM | POA: Diagnosis not present

## 2022-04-07 DIAGNOSIS — J449 Chronic obstructive pulmonary disease, unspecified: Secondary | ICD-10-CM | POA: Diagnosis not present

## 2022-04-07 DIAGNOSIS — I10 Essential (primary) hypertension: Secondary | ICD-10-CM | POA: Diagnosis not present

## 2022-04-07 DIAGNOSIS — J9620 Acute and chronic respiratory failure, unspecified whether with hypoxia or hypercapnia: Secondary | ICD-10-CM | POA: Diagnosis not present

## 2022-04-16 ENCOUNTER — Other Ambulatory Visit: Payer: Self-pay | Admitting: Pulmonary Disease

## 2022-04-25 ENCOUNTER — Telehealth: Payer: Self-pay | Admitting: *Deleted

## 2022-04-25 NOTE — Telephone Encounter (Signed)
10:07a Received a communication that patient's daughter, [name] called in and has questions about hospice.  11:13a I called and left a voicemail with my contact information for return call.

## 2022-04-26 ENCOUNTER — Other Ambulatory Visit: Payer: Medicare Other

## 2022-04-26 DIAGNOSIS — Z515 Encounter for palliative care: Secondary | ICD-10-CM

## 2022-04-28 ENCOUNTER — Telehealth: Payer: Self-pay

## 2022-04-28 NOTE — Telephone Encounter (Signed)
1314- Palliative Care Attempted Palliative check in call. No answer. Left voicemail.

## 2022-04-29 NOTE — Progress Notes (Signed)
COMMUNITY PALLIATIVE CARE SW NOTE  PATIENT NAME: Cory Kemp DOB: 1941/08/08 MRN: [id number]  PRIMARY CARE PROVIDER: Jani Gravel, MD  RESPONSIBLE PARTY:  Acct ID - Guarantor Home Phone Work Phone Relationship Acct Type  [id number] DANON, LOGRASSO* [phone]  Self P/F     [address] [place], [place] [address]   SOCIAL WORK TELEPHONIC VISIT  PC SW completed a telephonic visit with patient's daughter-[name]. She reported that she was concerned about patient's personal care and decline. She advised that the NP has talked with patient about hospice during her last visit and she was wondering if he might eligible now since he is having some changes. She provided a status update on patient. Patient is still driving. His appetite is fair, but she hoped he would eat healthier. He has not had any weight loss. He is on 4L of continuous o2 via nasal canula. Patient is independent for personal care, but has poor hygiene. SW advised her based on his status, it does not seem that he is eligible for hospice just yet. She also shared that she is experiencing caregiver fatigue. SW provided empathy and education regarding caregiver fatigue. SW encouraged her to set limits/boundaries for herself to maintain self--care. SW will make a mobile meals referral for patient. SW also scheduled a follow-up visit with the RN/SW team for 05/10/22 @ 11am. SW provided education regarding POA/HCPOA. The daughter have the documents and plan to have them notarize on her own.   No other concerns noted.   [address] [place], [place]

## 2022-05-10 ENCOUNTER — Other Ambulatory Visit: Payer: Medicare Other | Admitting: *Deleted

## 2022-05-10 ENCOUNTER — Other Ambulatory Visit: Payer: Medicare Other

## 2022-05-10 VITALS — BP 130/62 | HR 98 | Temp 97.9°F | Resp 18

## 2022-05-10 DIAGNOSIS — Z515 Encounter for palliative care: Secondary | ICD-10-CM

## 2022-05-10 NOTE — Progress Notes (Signed)
COMMUNITY PALLIATIVE CARE SW NOTE  PATIENT NAME: Cory Kemp DOB: [birth year] MRN: [id number]  PRIMARY CARE PROVIDER: Jani Gravel, MD  RESPONSIBLE PARTY:  Acct ID - Guarantor Home Phone Work Phone Relationship Acct Type  [id number] - [name],[name] [phone]  Self P/F     [address], [place], [place] [address]   SOCIAL WORK VISIT/Homevisit  PC SW and RN-M. Howard completed a visit with patient at his home for a follow-up visit. Patient was sitting in his recliner, with his o2 on. However, the RN assisted him with turning on the concentrator as it was not on.  He is on 4L of continuous o2 via nasal canula.Patient  advised that he is still driving. He report that his appetite is good. He has not had any weight loss.  Patient is independent for personal care. He does have some shortness of breath with activities, but easily recovers. Patient was very engaged in life review, and his love of woodworking and cordial with the team. He verbalized no needs, and thanked the team for visiting with him. He is open to ongoing palliative care support/visits as appropriate.  No other concerns are noted.   [address] [place], [place]

## 2022-05-12 NOTE — Progress Notes (Signed)
[facility] RN NOTE  PATIENT NAME: Cory Kemp DOB: [birth year] MRN: [id number]  PRIMARY CARE PROVIDER: Jani Gravel, MD  RESPONSIBLE PARTY: Levada Dy (daughter) Acct ID - Guarantor Home Phone Work Phone Relationship Acct Type  [id number] [name], [name] [phone]  Self P/F     [address], [place], [place] [address]    RN/SW follow up visit completed with patient in his home.  Cognitive: Patient is alert and oriented x 4, pleasant mood, forgetful  Pain: Denies pain or discomfort  Respiratory: Reports shortness of breath with exertion and requires rest periods. Initially, patient did not realize his concentrator was not on. Oxygen level was 85% on room air. On 3L it came up to 94%.  Appetite: Reports that he eats well 3 meals/day.  Mobility: Independent with ADLs. Continues to drive.   Patient's condition is currently stable. No complaints or concerns at this time.  CODE STATUS: Full Code ADVANCED DIRECTIVES: Y MOST FORM: no PPS: 70%   PHYSICAL EXAM:   VITALS: Today's Vitals   05/10/22 1130  BP: 130/62  Pulse: 98  Resp: 18  Temp: 97.9 F (36.6 C)  TempSrc: Temporal  SpO2: 94%  PainSc: 0-No pain    LUNGS: clear to auscultation  CARDIAC: Cor RRR EXTREMITIES: No edema SKIN:  Exposed skin is dry and intact   NEURO:  Alert and oriented x 3, pleasant mood, forgetful, ambulatory   Daryl Eastern, RN BSN

## 2022-05-16 ENCOUNTER — Other Ambulatory Visit: Payer: Self-pay | Admitting: Pulmonary Disease

## 2022-05-17 ENCOUNTER — Other Ambulatory Visit: Payer: Self-pay | Admitting: *Deleted

## 2022-05-17 MED ORDER — ALBUTEROL SULFATE HFA 108 (90 BASE) MCG/ACT IN AERS: INHALATION_SPRAY | RESPIRATORY_TRACT | 5 refills | Status: DC

## 2022-06-02 ENCOUNTER — Other Ambulatory Visit: Payer: Self-pay | Admitting: Pulmonary Disease

## 2022-06-09 ENCOUNTER — Encounter (HOSPITAL_COMMUNITY): Payer: Self-pay

## 2022-06-09 ENCOUNTER — Emergency Department (HOSPITAL_COMMUNITY): Payer: Medicare Other

## 2022-06-09 ENCOUNTER — Other Ambulatory Visit: Payer: Self-pay

## 2022-06-09 ENCOUNTER — Emergency Department (HOSPITAL_COMMUNITY)
Admission: EM | Admit: 2022-06-09 | Discharge: 2022-06-09 | Disposition: A | Discharge status: Home / Self Care | Payer: Medicare Other | Source: Home / Self Care | Attending: Emergency Medicine | Admitting: Emergency Medicine

## 2022-06-09 DIAGNOSIS — M542 Cervicalgia: Secondary | ICD-10-CM | POA: Insufficient documentation

## 2022-06-09 DIAGNOSIS — J449 Chronic obstructive pulmonary disease, unspecified: Secondary | ICD-10-CM | POA: Insufficient documentation

## 2022-06-09 DIAGNOSIS — E785 Hyperlipidemia, unspecified: Secondary | ICD-10-CM | POA: Diagnosis not present

## 2022-06-09 DIAGNOSIS — Z888 Allergy status to other drugs, medicaments and biological substances status: Secondary | ICD-10-CM | POA: Diagnosis not present

## 2022-06-09 DIAGNOSIS — J189 Pneumonia, unspecified organism: Secondary | ICD-10-CM | POA: Diagnosis not present

## 2022-06-09 DIAGNOSIS — E119 Type 2 diabetes mellitus without complications: Secondary | ICD-10-CM | POA: Diagnosis not present

## 2022-06-09 DIAGNOSIS — D72829 Elevated white blood cell count, unspecified: Secondary | ICD-10-CM | POA: Insufficient documentation

## 2022-06-09 DIAGNOSIS — Z66 Do not resuscitate: Secondary | ICD-10-CM | POA: Diagnosis not present

## 2022-06-09 DIAGNOSIS — R000 Tachycardia, unspecified: Secondary | ICD-10-CM | POA: Diagnosis not present

## 2022-06-09 DIAGNOSIS — M436 Torticollis: Secondary | ICD-10-CM | POA: Diagnosis not present

## 2022-06-09 DIAGNOSIS — Z7951 Long term (current) use of inhaled steroids: Secondary | ICD-10-CM | POA: Insufficient documentation

## 2022-06-09 DIAGNOSIS — Z87891 Personal history of nicotine dependence: Secondary | ICD-10-CM | POA: Diagnosis not present

## 2022-06-09 DIAGNOSIS — I10 Essential (primary) hypertension: Secondary | ICD-10-CM | POA: Diagnosis not present

## 2022-06-09 DIAGNOSIS — Z1152 Encounter for screening for COVID-19: Secondary | ICD-10-CM | POA: Diagnosis not present

## 2022-06-09 DIAGNOSIS — R0603 Acute respiratory distress: Secondary | ICD-10-CM | POA: Diagnosis not present

## 2022-06-09 DIAGNOSIS — J9621 Acute and chronic respiratory failure with hypoxia: Secondary | ICD-10-CM | POA: Diagnosis not present

## 2022-06-09 DIAGNOSIS — R0602 Shortness of breath: Secondary | ICD-10-CM | POA: Diagnosis not present

## 2022-06-09 DIAGNOSIS — Z9981 Dependence on supplemental oxygen: Secondary | ICD-10-CM | POA: Diagnosis not present

## 2022-06-09 DIAGNOSIS — Z801 Family history of malignant neoplasm of trachea, bronchus and lung: Secondary | ICD-10-CM | POA: Diagnosis not present

## 2022-06-09 DIAGNOSIS — Z79899 Other long term (current) drug therapy: Secondary | ICD-10-CM | POA: Diagnosis not present

## 2022-06-09 DIAGNOSIS — S161XXA Strain of muscle, fascia and tendon at neck level, initial encounter: Secondary | ICD-10-CM | POA: Diagnosis not present

## 2022-06-09 DIAGNOSIS — J441 Chronic obstructive pulmonary disease with (acute) exacerbation: Secondary | ICD-10-CM | POA: Diagnosis not present

## 2022-06-09 DIAGNOSIS — J440 Chronic obstructive pulmonary disease with acute lower respiratory infection: Secondary | ICD-10-CM | POA: Diagnosis not present

## 2022-06-09 DIAGNOSIS — Z833 Family history of diabetes mellitus: Secondary | ICD-10-CM | POA: Diagnosis not present

## 2022-06-09 LAB — CBC WITH DIFFERENTIAL/PLATELET
Abs Immature Granulocytes: 0 10*3/uL (ref 0.00–0.07)
Basophils Absolute: 0 10*3/uL (ref 0.0–0.1)
Basophils Relative: 0 %
Eosinophils Absolute: 0 10*3/uL (ref 0.0–0.5)
Eosinophils Relative: 0 %
HCT: 44.7 % (ref 39.0–52.0)
Hemoglobin: 14.2 g/dL (ref 13.0–17.0)
Lymphocytes Relative: 8 %
Lymphs Abs: 1.1 10*3/uL (ref 0.7–4.0)
MCH: 29.7 pg (ref 26.0–34.0)
MCHC: 31.8 g/dL (ref 30.0–36.0)
MCV: 93.5 fL (ref 80.0–100.0)
Monocytes Absolute: 3.3 10*3/uL — ABNORMAL HIGH (ref 0.1–1.0)
Monocytes Relative: 25 %
Neutro Abs: 8.9 10*3/uL — ABNORMAL HIGH (ref 1.7–7.7)
Neutrophils Relative %: 67 %
Platelets: 167 10*3/uL (ref 150–400)
RBC: 4.78 MIL/uL (ref 4.22–5.81)
RDW: 14.2 % (ref 11.5–15.5)
WBC: 13.3 10*3/uL — ABNORMAL HIGH (ref 4.0–10.5)
nRBC: 0 % (ref 0.0–0.2)
nRBC: 0 /100 WBC

## 2022-06-09 LAB — COMPREHENSIVE METABOLIC PANEL
ALT: 15 U/L (ref 0–44)
AST: 18 U/L (ref 15–41)
Albumin: 3.9 g/dL (ref 3.5–5.0)
Alkaline Phosphatase: 54 U/L (ref 38–126)
Anion gap: 15 (ref 5–15)
BUN: 18 mg/dL (ref 8–23)
CO2: 26 mmol/L (ref 22–32)
Calcium: 9.6 mg/dL (ref 8.9–10.3)
Chloride: 102 mmol/L (ref 98–111)
Creatinine, Ser: 0.96 mg/dL (ref 0.61–1.24)
GFR, Estimated: >60 mL/min (ref >60)
Glucose, Bld: 94 mg/dL (ref 70–99)
Potassium: 4.5 mmol/L (ref 3.5–5.1)
Sodium: 143 mmol/L (ref 135–145)
Total Bilirubin: 0.7 mg/dL (ref 0.3–1.2)
Total Protein: 6.9 g/dL (ref 6.5–8.1)

## 2022-06-09 LAB — TROPONIN I (HIGH SENSITIVITY): Troponin I (High Sensitivity): 23 ng/L — ABNORMAL HIGH (ref <18)

## 2022-06-09 MED ORDER — TIZANIDINE HCL 2 MG PO TABS: 2.0000 mg | ORAL_TABLET | Freq: Every day | ORAL | 0 refills | Status: AC

## 2022-06-09 MED ORDER — DICLOFENAC SODIUM 1 % EX GEL: CUTANEOUS | 0 refills | Status: AC

## 2022-06-09 NOTE — ED Provider Triage Note (Signed)
Emergency Medicine Provider Triage Evaluation Note  LAMONE FERRELLI , a 81 y.o. male  was evaluated in triage.  Pt complains of severe neck pain and intermittent worsening of his breathing.  He has a history of COPD and is oxygen dependent.  States he has heavy breathing and makes "gurgling" sound whenever he is up and moving around, but goes away when he rests.  He states his neck pain is so severe he cannot sleep or take a bath.  Review of Systems  Positive: Shortness of breath, neck pain Negative: Fever  Physical Exam  BP (!) 153/77 (BP Location: Right Arm)   Pulse 61   Temp 97.7 F (36.5 C) (Oral)   Resp (!) 26   Ht '6\' 1"'$  (1.854 m)   Wt 94.3 kg   SpO2 93%   BMI 27.44 kg/m  Gen:   Awake, no distress   Resp:  Increased effort, tachypneic, "gurgling/snoring" sound with expiration, rhonchi in all lung fields  MSK:   Moves extremities without difficulty, ROM neck limited by pain     Medical Decision Making  Medically screening exam initiated at 1:08 PM.  Appropriate orders placed.  Aline August was informed that the remainder of the evaluation will be completed by another provider, this initial triage assessment does not replace that evaluation, and the importance of remaining in the ED until their evaluation is complete.     Theressa Stamps R, [place] 06/09/22 1308

## 2022-06-09 NOTE — ED Provider Notes (Signed)
[facility] EMERGENCY DEPARTMENT Provider Note   CSN: [id number] Arrival date & time: 06/09/22  1248     History  Chief Complaint  Patient presents with   Torticollis    Cory Kemp is a 81 y.o. male.  HPI  Neck is sore, starting on left side of neck and running deep to neck. Neck pain started about a month ago, and was happening infrequently. Patient denies any truama or injury. It had started getting better, but is now worse. It's difficult for him to turn his neck. Patient has tried OTC pain meds w/ no relief. Pain is described as sharp, and rated 7-8/10.   Reports he's feeling his normal state of health, no constitutional symptoms.      Home Medications Prior to Admission medications   Medication Sig Start Date End Date Taking? Authorizing Provider  acetaminophen (TYLENOL) 500 MG tablet Take 1,000 mg by mouth every 6 (six) hours as needed for mild pain.    [provider]  albuterol (PROVENTIL) (2.5 MG/3ML) 0.083% nebulizer solution Take 3 mLs (2.5 mg total) by nebulization every 6 (six) hours as needed for wheezing or shortness of breath. 01/03/18   Lauraine Rinne, NP  albuterol (VENTOLIN HFA) 108 (90 Base) MCG/ACT inhaler INHALE 2 PUFFS BY MOUTH EVERY 6 HOURS AS NEEDED FOR WHEEZING OR SHORTNESS OF BREATH 06/02/22   Mannam, Praveen, MD  arformoterol (BROVANA) 15 MCG/2ML NEBU Take 2 mLs (15 mcg total) by nebulization 2 (two) times daily. Patient not taking: Reported on 01/31/2022 10/27/21   [name], MD  ascorbic acid (VITAMIN C) 500 MG tablet Take 1 tablet (500 mg total) by mouth daily. 04/25/20   Allie Bossier, MD  azithromycin (ZITHROMAX) 500 MG tablet Take 1 tablet (500 mg total) by mouth daily. 01/08/22   Ghimire, Henreitta Leber, MD  Budeson-Glycopyrrol-Formoterol (BREZTRI AEROSPHERE) 160-9-4.8 MCG/ACT AERO Inhale 2 puffs into the lungs in the morning and at bedtime. 01/31/22   Mannam, Hart Robinsons, MD  budesonide (PULMICORT) 0.5 MG/2ML nebulizer  solution Take 2 mLs (0.5 mg total) by nebulization daily. Patient not taking: Reported on 01/31/2022 10/27/21   [name], MD  carvedilol (COREG) 12.5 MG tablet Take 1 tablet (12.5 mg total) by mouth 2 (two) times daily with a meal. 01/08/22   Ghimire, Henreitta Leber, MD  cholecalciferol (VITAMIN D3) 25 MCG (1000 UNIT) tablet Take 1,000 Units by mouth daily.    [provider]  DALIRESP 250 MCG TABS TAKE 1 TABLET BY MOUTH EVERY MORNING 05/17/22   Mannam, Praveen, MD  escitalopram (LEXAPRO) 20 MG tablet Take 20 mg by mouth daily. 06/15/21   [provider]  fluticasone (FLONASE) 50 MCG/ACT nasal spray Place 2 sprays into both nostrils daily. Reduce to 1 spray each nostril daily after stopping Afrin 01/08/22   Ghimire, Henreitta Leber, MD  loratadine (CLARITIN) 10 MG tablet Take 1 tablet (10 mg total) by mouth daily for 15 days. 01/08/22 01/23/22  Ghimire, Henreitta Leber, MD  montelukast (SINGULAIR) 10 MG tablet Take 10 mg by mouth daily. 04/23/21   [provider]  OXYGEN Inhale 4 L into the lungs continuous.    [provider]  revefenacin (YUPELRI) 175 MCG/3ML nebulizer solution Take 3 mLs (175 mcg total) by nebulization daily. Patient not taking: Reported on 01/31/2022 10/27/21   [name], MD  simvastatin (ZOCOR) 40 MG tablet Take 40 mg by mouth every evening. 06/04/21   [provider]  vitamin B-12 (CYANOCOBALAMIN) 500 MCG tablet Take 1,000  mcg by mouth daily.    [provider]      Allergies    Adhesive [tape] and Tegaderm alginate ag rope    Review of Systems   Review of Systems  Musculoskeletal:  Positive for neck pain and neck stiffness.    Physical Exam Updated Vital Signs BP (!) 157/76   Pulse (!) 112   Temp 97.7 F (36.5 C) (Oral)   Resp (!) 35   Ht '6\' 1"'$  (1.854 m)   Wt 94.3 kg   SpO2 96%   BMI 27.44 kg/m  Physical Exam Constitutional:      Appearance: Normal appearance.  Musculoskeletal:     Cervical back: Rigidity, spasms and  tenderness present. No swelling, edema, deformity, erythema, signs of trauma, bony tenderness or crepitus. Pain with movement present. Decreased range of motion.  Neurological:     Mental Status: He is alert.     ED Results / Procedures / Treatments   Labs (all labs ordered are listed, but only abnormal results are displayed) Labs Reviewed  CBC WITH DIFFERENTIAL/PLATELET - Abnormal; Notable for the following components:      Result Value   WBC 13.3 (*)    All other components within normal limits  COMPREHENSIVE METABOLIC PANEL  TROPONIN I (HIGH SENSITIVITY)    EKG None  Radiology No results found.  Procedures Procedures    Medications Ordered in ED Medications - No data to display  ED Course/ Medical Decision Making/ A&P Clinical Course as of 06/09/22 1532  Thu Jun 09, 2022  1517 CT Cervical Spine Wo Contrast [BS]    Clinical Course User Index [BS] Holley Bouche, MD                           Medical Decision Making This patient presents to the ED for concern of neck pain and stiffness. DDX considered cervical fracture, cervical spine infection, MSK concerns like torticollis and muscle strain/spasm.    Lab Tests: I Ordered, and personally interpreted labs.  The pertinent results include: benig BMP an elevated WBC to 13.3 likely elevated as acute phase reactant in setting of pain.   Imaging Studies ordered:  I ordered imaging studies including CXR that was negative and showed COPD, and CT Cervical Spine that was negative.  I independently visualized and interpreted imaging which showed COPD and degenerative disk disease.  I agree with the radiologist interpretation   Disposition:  After consideration of the diagnostic results, I feel that the patent would benefit from discharge home with muscle relaxer and topical analgesic.    Amount and/or Complexity of Data Reviewed Labs:  Decision-making details documented in ED Course.    Details: CMP normal, WBC  elevated to 13.3 Radiology:  Decision-making details documented in ED Course.    Details: CXR was negative and showed COPD, CT Cervical spine negative for fracture or acute issues  Risk Prescription drug management.           Final Clinical Impression(s) / ED Diagnoses Final diagnoses:  None    Rx / DC Orders ED Discharge Orders     None         Holley Bouche, MD 06/09/22 1644    Margette Fast, MD 06/13/22 [phone]

## 2022-06-09 NOTE — Discharge Instructions (Addendum)
We saw you today for neck pain/stiffness. Your neck imaging was negative for any fracture or acute problem. It looks like you may have some neck strain/spasm in the muscles of your neck, that are causing your limited ability to turn your head, and pain.   We are discharging you home with a medication to help with the neck pain. This medicine (Zanaflex/tizanidine) can make you wobbly and sleepy. Only take at home, before going to bed.   We have also written you a prescription for a gel (Voltaren gel), that you can place on your neck to help with pain. You may also consider trying over the counter topical pain relief (lidocaine patches, capsaicin patches). You can use these twice a day and apply to affected area on neck.   Seek medical care if: You develop worsening neck pain, inability to turn head You develop weakness in neck/arms/legs You develop sudden dizziness You have trouble swallowing or pain when swallowing. You have changes in your speech, understanding, or vision.

## 2022-06-09 NOTE — ED Notes (Signed)
Discharge instructions reviewed with patient. Patient denies any questions or concerns. Patient out to lobby via wheelchair. 

## 2022-06-09 NOTE — ED Triage Notes (Signed)
Patient has severe COPD and breathes heavily and fast making a gurgling sounds which patient reports is his baseline.  Patient on 5L [place].  Patients only complaint is his neck pain that is severe and unable to take a bath or sleep.[name] Kitchen

## 2022-06-12 ENCOUNTER — Inpatient Hospital Stay (HOSPITAL_COMMUNITY)
Admission: EM | Admit: 2022-06-12 | Discharge: 2022-06-16 | DRG: 193 | Disposition: A | Discharge status: Home Health | Payer: Medicare Other | Attending: Internal Medicine | Admitting: Internal Medicine

## 2022-06-12 ENCOUNTER — Other Ambulatory Visit: Payer: Self-pay

## 2022-06-12 ENCOUNTER — Emergency Department (HOSPITAL_COMMUNITY): Payer: Medicare Other

## 2022-06-12 ENCOUNTER — Encounter (HOSPITAL_COMMUNITY): Payer: Self-pay | Admitting: *Deleted

## 2022-06-12 DIAGNOSIS — Z66 Do not resuscitate: Secondary | ICD-10-CM | POA: Diagnosis present

## 2022-06-12 DIAGNOSIS — I10 Essential (primary) hypertension: Secondary | ICD-10-CM | POA: Diagnosis present

## 2022-06-12 DIAGNOSIS — Z789 Other specified health status: Secondary | ICD-10-CM | POA: Diagnosis not present

## 2022-06-12 DIAGNOSIS — R0603 Acute respiratory distress: Secondary | ICD-10-CM | POA: Diagnosis not present

## 2022-06-12 DIAGNOSIS — J189 Pneumonia, unspecified organism: Secondary | ICD-10-CM | POA: Diagnosis present

## 2022-06-12 DIAGNOSIS — Z515 Encounter for palliative care: Secondary | ICD-10-CM | POA: Diagnosis not present

## 2022-06-12 DIAGNOSIS — J9621 Acute and chronic respiratory failure with hypoxia: Secondary | ICD-10-CM | POA: Diagnosis present

## 2022-06-12 DIAGNOSIS — R0689 Other abnormalities of breathing: Secondary | ICD-10-CM | POA: Diagnosis not present

## 2022-06-12 DIAGNOSIS — Z79899 Other long term (current) drug therapy: Secondary | ICD-10-CM | POA: Diagnosis not present

## 2022-06-12 DIAGNOSIS — N400 Enlarged prostate without lower urinary tract symptoms: Secondary | ICD-10-CM | POA: Diagnosis present

## 2022-06-12 DIAGNOSIS — E119 Type 2 diabetes mellitus without complications: Secondary | ICD-10-CM

## 2022-06-12 DIAGNOSIS — J80 Acute respiratory distress syndrome: Secondary | ICD-10-CM | POA: Diagnosis not present

## 2022-06-12 DIAGNOSIS — R0600 Dyspnea, unspecified: Secondary | ICD-10-CM | POA: Diagnosis not present

## 2022-06-12 DIAGNOSIS — Z9981 Dependence on supplemental oxygen: Secondary | ICD-10-CM

## 2022-06-12 DIAGNOSIS — J440 Chronic obstructive pulmonary disease with acute lower respiratory infection: Secondary | ICD-10-CM | POA: Diagnosis present

## 2022-06-12 DIAGNOSIS — J441 Chronic obstructive pulmonary disease with (acute) exacerbation: Secondary | ICD-10-CM | POA: Diagnosis not present

## 2022-06-12 DIAGNOSIS — Z7189 Other specified counseling: Secondary | ICD-10-CM | POA: Diagnosis not present

## 2022-06-12 DIAGNOSIS — Z833 Family history of diabetes mellitus: Secondary | ICD-10-CM | POA: Diagnosis not present

## 2022-06-12 DIAGNOSIS — M436 Torticollis: Secondary | ICD-10-CM | POA: Diagnosis present

## 2022-06-12 DIAGNOSIS — R0902 Hypoxemia: Secondary | ICD-10-CM | POA: Diagnosis not present

## 2022-06-12 DIAGNOSIS — Z801 Family history of malignant neoplasm of trachea, bronchus and lung: Secondary | ICD-10-CM | POA: Diagnosis not present

## 2022-06-12 DIAGNOSIS — E785 Hyperlipidemia, unspecified: Secondary | ICD-10-CM | POA: Diagnosis present

## 2022-06-12 DIAGNOSIS — Z1152 Encounter for screening for COVID-19: Secondary | ICD-10-CM

## 2022-06-12 DIAGNOSIS — Z87891 Personal history of nicotine dependence: Secondary | ICD-10-CM | POA: Diagnosis not present

## 2022-06-12 DIAGNOSIS — R0602 Shortness of breath: Secondary | ICD-10-CM | POA: Diagnosis not present

## 2022-06-12 DIAGNOSIS — F0390 Unspecified dementia without behavioral disturbance: Secondary | ICD-10-CM | POA: Diagnosis present

## 2022-06-12 DIAGNOSIS — Z743 Need for continuous supervision: Secondary | ICD-10-CM | POA: Diagnosis not present

## 2022-06-12 DIAGNOSIS — Z888 Allergy status to other drugs, medicaments and biological substances status: Secondary | ICD-10-CM | POA: Diagnosis not present

## 2022-06-12 LAB — CBC WITH DIFFERENTIAL/PLATELET
Abs Immature Granulocytes: 0.11 10*3/uL — ABNORMAL HIGH (ref 0.00–0.07)
Basophils Absolute: 0 10*3/uL (ref 0.0–0.1)
Basophils Relative: 0 %
Eosinophils Absolute: 0 10*3/uL (ref 0.0–0.5)
Eosinophils Relative: 0 %
HCT: 42.1 % (ref 39.0–52.0)
Hemoglobin: 13.5 g/dL (ref 13.0–17.0)
Immature Granulocytes: 1 %
Lymphocytes Relative: 15 %
Lymphs Abs: 1.6 10*3/uL (ref 0.7–4.0)
MCH: 29.7 pg (ref 26.0–34.0)
MCHC: 32.1 g/dL (ref 30.0–36.0)
MCV: 92.5 fL (ref 80.0–100.0)
Monocytes Absolute: 2.8 10*3/uL — ABNORMAL HIGH (ref 0.1–1.0)
Monocytes Relative: 27 %
Neutro Abs: 5.9 10*3/uL (ref 1.7–7.7)
Neutrophils Relative %: 57 %
Platelets: 160 10*3/uL (ref 150–400)
RBC: 4.55 MIL/uL (ref 4.22–5.81)
RDW: 14.2 % (ref 11.5–15.5)
WBC: 10.4 10*3/uL (ref 4.0–10.5)
nRBC: 0 % (ref 0.0–0.2)

## 2022-06-12 LAB — I-STAT ARTERIAL BLOOD GAS, ED
Acid-Base Excess: 5 mmol/L — ABNORMAL HIGH (ref 0.0–2.0)
Bicarbonate: 31.3 mmol/L — ABNORMAL HIGH (ref 20.0–28.0)
Calcium, Ion: 1.25 mmol/L (ref 1.15–1.40)
HCT: 38 % — ABNORMAL LOW (ref 39.0–52.0)
Hemoglobin: 12.9 g/dL — ABNORMAL LOW (ref 13.0–17.0)
O2 Saturation: 100 %
Patient temperature: 97.7
Potassium: 3.6 mmol/L (ref 3.5–5.1)
Sodium: 140 mmol/L (ref 135–145)
TCO2: 33 mmol/L — ABNORMAL HIGH (ref 22–32)
pCO2 arterial: 53.2 mmHg — ABNORMAL HIGH (ref 32–48)
pH, Arterial: 7.375 (ref 7.35–7.45)
pO2, Arterial: 198 mmHg — ABNORMAL HIGH (ref 83–108)

## 2022-06-12 LAB — COMPREHENSIVE METABOLIC PANEL
ALT: 16 U/L (ref 0–44)
AST: 19 U/L (ref 15–41)
Albumin: 3.6 g/dL (ref 3.5–5.0)
Alkaline Phosphatase: 52 U/L (ref 38–126)
Anion gap: 11 (ref 5–15)
BUN: 17 mg/dL (ref 8–23)
CO2: 26 mmol/L (ref 22–32)
Calcium: 9 mg/dL (ref 8.9–10.3)
Chloride: 101 mmol/L (ref 98–111)
Creatinine, Ser: 0.83 mg/dL (ref 0.61–1.24)
GFR, Estimated: >60 mL/min (ref >60)
Glucose, Bld: 139 mg/dL — ABNORMAL HIGH (ref 70–99)
Potassium: 3.9 mmol/L (ref 3.5–5.1)
Sodium: 138 mmol/L (ref 135–145)
Total Bilirubin: 0.8 mg/dL (ref 0.3–1.2)
Total Protein: 6.8 g/dL (ref 6.5–8.1)

## 2022-06-12 LAB — CBG MONITORING, ED
Glucose-Capillary: 163 mg/dL — ABNORMAL HIGH (ref 70–99)
Glucose-Capillary: 195 mg/dL — ABNORMAL HIGH (ref 70–99)

## 2022-06-12 LAB — BRAIN NATRIURETIC PEPTIDE: B Natriuretic Peptide: 150.1 pg/mL — ABNORMAL HIGH (ref 0.0–100.0)

## 2022-06-12 LAB — TROPONIN I (HIGH SENSITIVITY): Troponin I (High Sensitivity): 15 ng/L (ref <18)

## 2022-06-12 LAB — SARS CORONAVIRUS 2 BY RT PCR: SARS Coronavirus 2 by RT PCR: NEGATIVE

## 2022-06-12 LAB — GLUCOSE, CAPILLARY: Glucose-Capillary: 194 mg/dL — ABNORMAL HIGH (ref 70–99)

## 2022-06-12 MED ORDER — ACETAMINOPHEN 325 MG PO TABS
650.0000 mg | ORAL_TABLET | Freq: Four times a day (QID) | ORAL | Status: DC | PRN
  Administered 2022-06-13 – 2022-06-15 (×2): 650 mg via ORAL
  Filled 2022-06-12 (×2): qty 2

## 2022-06-12 MED ORDER — METHYLPREDNISOLONE SODIUM SUCC 125 MG IJ SOLR: 60.0000 mg | Freq: Two times a day (BID) | INTRAMUSCULAR | Status: DC

## 2022-06-12 MED ORDER — IPRATROPIUM-ALBUTEROL 0.5-2.5 (3) MG/3ML IN SOLN
3.0000 mL | Freq: Four times a day (QID) | RESPIRATORY_TRACT | Status: DC
  Administered 2022-06-12 (×3): 3 mL via RESPIRATORY_TRACT
  Filled 2022-06-12 (×3): qty 3

## 2022-06-12 MED ORDER — INSULIN ASPART 100 UNIT/ML IJ SOLN
0.0000 [IU] | Freq: Three times a day (TID) | INTRAMUSCULAR | Status: DC
  Administered 2022-06-12 (×2): 3 [IU] via SUBCUTANEOUS
  Administered 2022-06-13: 11 [IU] via SUBCUTANEOUS
  Administered 2022-06-13 – 2022-06-14 (×2): 3 [IU] via SUBCUTANEOUS
  Administered 2022-06-14 – 2022-06-15 (×3): 2 [IU] via SUBCUTANEOUS
  Administered 2022-06-15: 3 [IU] via SUBCUTANEOUS

## 2022-06-12 MED ORDER — UMECLIDINIUM BROMIDE 62.5 MCG/ACT IN AEPB
1.0000 | INHALATION_SPRAY | Freq: Every day | RESPIRATORY_TRACT | Status: DC
  Administered 2022-06-13 – 2022-06-16 (×3): 1 via RESPIRATORY_TRACT
  Filled 2022-06-12: qty 7

## 2022-06-12 MED ORDER — FLUTICASONE FUROATE-VILANTEROL 200-25 MCG/ACT IN AEPB
1.0000 | INHALATION_SPRAY | Freq: Every day | RESPIRATORY_TRACT | Status: DC
  Administered 2022-06-13 – 2022-06-16 (×3): 1 via RESPIRATORY_TRACT
  Filled 2022-06-12: qty 28

## 2022-06-12 MED ORDER — ENOXAPARIN SODIUM 40 MG/0.4ML IJ SOSY
40.0000 mg | PREFILLED_SYRINGE | INTRAMUSCULAR | Status: DC
  Administered 2022-06-12 – 2022-06-16 (×5): 40 mg via SUBCUTANEOUS
  Filled 2022-06-12 (×5): qty 0.4

## 2022-06-12 MED ORDER — METHYLPREDNISOLONE SODIUM SUCC 125 MG IJ SOLR
80.0000 mg | Freq: Two times a day (BID) | INTRAMUSCULAR | Status: AC
  Administered 2022-06-12 – 2022-06-13 (×2): 80 mg via INTRAVENOUS
  Filled 2022-06-12 (×2): qty 2

## 2022-06-12 MED ORDER — SODIUM CHLORIDE 0.9 % IV SOLN
2.0000 g | Freq: Three times a day (TID) | INTRAVENOUS | Status: DC
  Administered 2022-06-12 – 2022-06-16 (×13): 2 g via INTRAVENOUS
  Filled 2022-06-12 (×13): qty 12.5

## 2022-06-12 MED ORDER — POLYETHYLENE GLYCOL 3350 17 G PO PACK: 17.0000 g | PACK | Freq: Every day | ORAL | Status: DC | PRN

## 2022-06-12 MED ORDER — SODIUM CHLORIDE 0.9 % IV SOLN
500.0000 mg | Freq: Once | INTRAVENOUS | Status: AC
  Administered 2022-06-12: 500 mg via INTRAVENOUS
  Filled 2022-06-12: qty 5

## 2022-06-12 MED ORDER — HYDRALAZINE HCL 20 MG/ML IJ SOLN: 5.0000 mg | INTRAMUSCULAR | Status: DC | PRN

## 2022-06-12 MED ORDER — FLUTICASONE PROPIONATE 50 MCG/ACT NA SUSP: 2.0000 | Freq: Every day | NASAL | Status: DC

## 2022-06-12 MED ORDER — LORATADINE 10 MG PO TABS
10.0000 mg | ORAL_TABLET | Freq: Every day | ORAL | Status: DC
  Administered 2022-06-12 – 2022-06-16 (×5): 10 mg via ORAL
  Filled 2022-06-12 (×5): qty 1

## 2022-06-12 MED ORDER — SODIUM CHLORIDE 0.9 % IV SOLN
1.0000 g | Freq: Once | INTRAVENOUS | Status: AC
  Administered 2022-06-12: 1 g via INTRAVENOUS
  Filled 2022-06-12: qty 10

## 2022-06-12 MED ORDER — IPRATROPIUM-ALBUTEROL 0.5-2.5 (3) MG/3ML IN SOLN
3.0000 mL | Freq: Two times a day (BID) | RESPIRATORY_TRACT | Status: DC
  Administered 2022-06-13 – 2022-06-16 (×7): 3 mL via RESPIRATORY_TRACT
  Filled 2022-06-12 (×7): qty 3

## 2022-06-12 MED ORDER — ESCITALOPRAM OXALATE 20 MG PO TABS
20.0000 mg | ORAL_TABLET | Freq: Every day | ORAL | Status: DC
  Administered 2022-06-13 – 2022-06-16 (×4): 20 mg via ORAL
  Filled 2022-06-12 (×4): qty 1

## 2022-06-12 MED ORDER — SODIUM CHLORIDE 0.9% FLUSH
3.0000 mL | Freq: Two times a day (BID) | INTRAVENOUS | Status: DC
  Administered 2022-06-12 – 2022-06-15 (×7): 3 mL via INTRAVENOUS

## 2022-06-12 MED ORDER — STERILE WATER FOR INJECTION IJ SOLN
INTRAMUSCULAR | Status: AC
  Administered 2022-06-12: 2 mL
  Filled 2022-06-12: qty 10

## 2022-06-12 MED ORDER — ALBUTEROL SULFATE (2.5 MG/3ML) 0.083% IN NEBU
2.5000 mg | INHALATION_SOLUTION | RESPIRATORY_TRACT | Status: DC | PRN
  Administered 2022-06-14 – 2022-06-15 (×5): 2.5 mg via RESPIRATORY_TRACT
  Filled 2022-06-12 (×5): qty 3

## 2022-06-12 MED ORDER — PREDNISONE 20 MG PO TABS: 40.0000 mg | ORAL_TABLET | Freq: Every day | ORAL | Status: DC

## 2022-06-12 MED ORDER — PREDNISONE 20 MG PO TABS
40.0000 mg | ORAL_TABLET | Freq: Every day | ORAL | Status: DC
  Administered 2022-06-13 – 2022-06-15 (×3): 40 mg via ORAL
  Filled 2022-06-12 (×4): qty 2

## 2022-06-12 MED ORDER — DOCUSATE SODIUM 100 MG PO CAPS
100.0000 mg | ORAL_CAPSULE | Freq: Two times a day (BID) | ORAL | Status: DC
  Administered 2022-06-12 – 2022-06-16 (×7): 100 mg via ORAL
  Filled 2022-06-12 (×9): qty 1

## 2022-06-12 MED ORDER — METHYLPREDNISOLONE SODIUM SUCC 125 MG IJ SOLR
125.0000 mg | INTRAMUSCULAR | Status: AC
  Administered 2022-06-12: 125 mg via INTRAVENOUS
  Filled 2022-06-12: qty 2

## 2022-06-12 MED ORDER — BISACODYL 5 MG PO TBEC: 5.0000 mg | DELAYED_RELEASE_TABLET | Freq: Every day | ORAL | Status: DC | PRN

## 2022-06-12 MED ORDER — BUDESON-GLYCOPYRROL-FORMOTEROL 160-9-4.8 MCG/ACT IN AERO: 2.0000 | INHALATION_SPRAY | Freq: Two times a day (BID) | RESPIRATORY_TRACT | Status: DC

## 2022-06-12 MED ORDER — CARVEDILOL 12.5 MG PO TABS
12.5000 mg | ORAL_TABLET | Freq: Two times a day (BID) | ORAL | Status: DC
  Administered 2022-06-12 – 2022-06-16 (×8): 12.5 mg via ORAL
  Filled 2022-06-12 (×8): qty 1

## 2022-06-12 MED ORDER — ONDANSETRON HCL 4 MG/2ML IJ SOLN: 4.0000 mg | Freq: Four times a day (QID) | INTRAMUSCULAR | Status: DC | PRN

## 2022-06-12 MED ORDER — SIMVASTATIN 20 MG PO TABS
40.0000 mg | ORAL_TABLET | Freq: Every evening | ORAL | Status: DC
  Administered 2022-06-12 – 2022-06-15 (×4): 40 mg via ORAL
  Filled 2022-06-12 (×4): qty 2

## 2022-06-12 MED ORDER — MONTELUKAST SODIUM 10 MG PO TABS
10.0000 mg | ORAL_TABLET | Freq: Every day | ORAL | Status: DC
  Administered 2022-06-12 – 2022-06-16 (×5): 10 mg via ORAL
  Filled 2022-06-12 (×5): qty 1

## 2022-06-12 MED ORDER — GUAIFENESIN ER 600 MG PO TB12
600.0000 mg | ORAL_TABLET | Freq: Two times a day (BID) | ORAL | Status: DC | PRN
  Administered 2022-06-14 – 2022-06-15 (×2): 600 mg via ORAL
  Filled 2022-06-12 (×2): qty 1

## 2022-06-12 MED ORDER — GUAIFENESIN-DM 100-10 MG/5ML PO SYRP
5.0000 mL | ORAL_SOLUTION | ORAL | Status: DC | PRN
  Administered 2022-06-12 – 2022-06-14 (×2): 5 mL via ORAL
  Filled 2022-06-12 (×2): qty 5

## 2022-06-12 MED ORDER — ONDANSETRON HCL 4 MG PO TABS: 4.0000 mg | ORAL_TABLET | Freq: Four times a day (QID) | ORAL | Status: DC | PRN

## 2022-06-12 MED ORDER — ROFLUMILAST 250 MCG PO TABS: 1.0000 | ORAL_TABLET | Freq: Every morning | ORAL | Status: DC

## 2022-06-12 MED ORDER — ACETAMINOPHEN 650 MG RE SUPP: 650.0000 mg | Freq: Four times a day (QID) | RECTAL | Status: DC | PRN

## 2022-06-12 MED ORDER — ROFLUMILAST 500 MCG PO TABS
250.0000 ug | ORAL_TABLET | Freq: Every day | ORAL | Status: DC
  Administered 2022-06-13 – 2022-06-16 (×4): 250 ug via ORAL
  Filled 2022-06-12 (×4): qty 1

## 2022-06-12 MED ORDER — TIZANIDINE HCL 2 MG PO TABS
2.0000 mg | ORAL_TABLET | Freq: Every day | ORAL | Status: DC
  Administered 2022-06-12 – 2022-06-15 (×4): 2 mg via ORAL
  Filled 2022-06-12 (×4): qty 1

## 2022-06-12 NOTE — Consult Note (Signed)
Consultation Note Date: 06/12/2022   Patient Name: Cory Kemp  DOB: Jul 15, 1941  MRN: [id number]  Age / Sex: 81 y.o., male  PCP: Cory Gravel, MD Referring Physician: Karmen Bongo, MD  Reason for Consultation: Establishing goals of care  HPI/Patient Profile: 81 y.o. male  with past medical history of  remote ETOH dependence, BPH, COPD on 4L home O2, dementia, DM, HTN, and HLD presented to ED on 06/12/2022 with complaints of shortness of breath.  He was placed on BiPAP in ED.  Patient was admitted on 06/12/2022 with acute respiratory failure associated with COPD exacerbation.   Of note, patient was recently in the ED on 06/09/2022 with neck pain and stiffness but was not admitted. He has had 2 admission and 1 ED visit in the last 6 months.  Clinical Assessment and Goals of Care: I have reviewed medical records including EPIC notes, labs, and imaging. Received report from primary RN -no acute concerns.  Reports patient has been able to wean off BiPAP down to his baseline of 4 L nasal cannula.  Went to visit patient at bedside -no family/visitors present. Patient was lying in bed awake, alert, oriented, and able to participate in conversation. No signs or non-verbal gestures of pain or discomfort noted. No respiratory distress, increased work of breathing, or secretions noted.  Patient does endorse shortness of breath but states "I am always short of breath."  He denies pain.  He is on his baseline 4 L O2 nasal cannula.  He tells me that he feels much better now than when he first arrived.  Met with patient to discuss diagnosis, prognosis, GOC, EOL wishes, disposition, and options.  I introduced Palliative Medicine as specialized medical care for people living with serious illness. It focuses on providing relief from the symptoms and stress of a serious illness. The goal is to improve quality of life for both the patient  and the family.  We discussed a brief life review of the patient as well as functional and nutritional status.  Patient is widowed -he has 1 daughter and 1 son.  Prior to hospitalization he was living in a private residence with a roommate.  Patient tells me that it is very hard for him to get up and walk due to significant dyspnea on exertion.  He is able to walk to his car; however, does need to take several breaks to catch his breath to get there.  He enjoys driving his car to get out of the house.  Patient tells me that he likes to "be active" and is "frustrated" that he cannot be any more.  Prior to significant symptom burden patient enjoyed camping, hiking, canoeing, woodworking.  Patient does sleep upright to breathe better.  He feels short of breath at times even at rest.  We discussed patient's current illness and what it means in the larger context of patient's on-going co-morbidities.  Patient understands that COPD is a progressive, non-curable disease underlying the patient's current acute medical conditions.  He understands his COPD has reached advanced stages as evidenced by his significant symptom burden. Natural disease trajectory and expectations at EOL were discussed. I attempted to elicit values and goals of care important to the patient. The difference between aggressive medical intervention and comfort care was considered in light of the patient's goals of care.   Opened discussion on option of hospice care - patient tells me "that is where people go to die."  Provided education and counseling at length on the  philosophy and benefits of hospice care. Discussed that it offers a holistic approach to care in the setting of end-stage illness, and is about supporting the patient where they are allowing nature to take it's course. Discussed the hospice team includes RNs, physicians, social workers, and chaplains. They can provide personal care, support for the family, and help keep patient out  of the hospital as well as assist with DME needs for home hospice. Education provided on the difference between home vs residential hospice.  After receiving additional information and hearing that patient could receive hospice services in his own home he is very much interested in this option as his goal is to not be rehospitalized.  He would like to focus on quality of life.  Goal while in-house is to continue current supportive treatment for medical optimization.  Advance directives, concepts specific to code status, artificial feeding and hydration, and rehospitalization were considered and discussed.  Patient does not have a living will or healthcare power of attorney.  He would want his surrogate decision makers to be 1.  Cory Kemp/daughter and 2. Cory "[employer], [name]son.  He is open to completing HCPOA paperwork while in house -will consult chaplain.  Chaplain versus offered -he would very much appreciate a visit from the chaplain for emotional support and prayer.  He is of [place] faith.  Encouraged patient to consider DNR/DNI status understanding evidenced based poor outcomes in similar hospitalized patient, as the cause of arrest is likely associated with advanced chronic/terminal illness rather than an easily reversible acute cardio-pulmonary event. I explained that DNR/DNI does not change the medical plan and it only comes into effect after a person has arrested (died).  It is a protective measure to keep [place] from harming the patient in their last moments of life. Patient was agreeable to DNR/DNI with understanding that he would not receive CPR, defibrillation, ACLS medications, or intubation.  He tells me that he has "lived a good life" and "if God calls me home so be it."  Discussed with patient the importance of continued conversation family and the medical providers regarding overall plan of care and treatment options, ensuring decisions are within the context of the patient's values  and GOCs.    Questions and concerns were addressed. The patient/family was encouraged to call with questions and/or concerns. PMT card was provided.   Primary Decision Maker: PATIENT    SUMMARY OF RECOMMENDATIONS   Continue current supportive treatment for medical optimization Now DNR/DNI - durable DNR form completed. Copy was made and will be scanned into Vynca/ACP tab Patient is interested in home hospice services at discharge - [facility] notified and consult placed Chaplain consulted for: Emotional support, prayer, HCPOA completion PMT will continue to follow and support holistically   Code Status/Advance Care Planning: DNR  Palliative Prophylaxis:  Aspiration, Frequent Pain Assessment, and Oral Care  Additional Recommendations (Limitations, Scope, Preferences): Avoid Hospitalization, Full Scope Treatment, and No Tracheostomy  Psycho-social/Spiritual:  Desire for further Chaplaincy support:yes Created space and opportunity for patient to express thoughts and feelings regarding patient's current medical situation.  Emotional support and therapeutic listening provided.  Prognosis:  Overall poor in the setting of advanced age, recurrent hospitalizations for advanced COPD, and multiple comorbidities  Discharge Planning: Home with Hospice      Primary Diagnoses: Present on Admission:  (Resolved) Multifocal pneumonia  Acute on chronic respiratory failure with hypoxia (HCC)  COPD with acute exacerbation ([place])  Essential hypertension  Dyslipidemia  Dementia without behavioral disturbance ([place])  I have reviewed the medical record, interviewed the patient and family, and examined the patient. The following aspects are pertinent.  Past Medical History:  Diagnosis Date   Alcohol abuse    Anemia    Arthritis    Asthma    B12 deficiency 11/03/2013   BPH (benign prostatic hypertrophy)    COPD (chronic obstructive pulmonary disease) (HCC)    Dementia (HCC)    ?   Diabetes  mellitus ([place]) 01/10/2012   Femoral hernia, right 03/27/2012   Glucose intolerance (impaired glucose tolerance)    Hyperlipidemia    Hypertension    Incisional hernias - swiss-cheese-type periumbilical 0/04/4708   Inguinal hernia - right 01/10/2012   Leukopenia 01/29/2013   Monocytosis 01/29/2013   Normochromic anemia 01/29/2013   Osteopenia    Social History   Socioeconomic History   Marital status: Widowed    Spouse name: Not on file   Number of children: 2   Years of education: 21   Highest education level: 12th grade  Occupational History   Occupation: Retired    Comment: [employer]  Tobacco Use   Smoking status: Former    Packs/day: 1.00    Years: 63.00    Total pack years: 63.00    Types: Cigarettes    Start date: 08/09/1955    Quit date: 08/08/2018    Years since quitting: 3.8   Smokeless tobacco: Never  Vaping Use   Vaping Use: Never used  Substance and Sexual Activity   Alcohol use: No    Alcohol/week: 0.0 standard drinks of alcohol   Drug use: No   Sexual activity: Not Currently  Other Topics Concern   Not on file  Social History Narrative   Not on file   Social Determinants of Health   Financial Resource Strain: Medium Risk (02/20/2019)   Overall Financial Resource Strain (CARDIA)    Difficulty of Paying Living Expenses: Somewhat hard  Food Insecurity: No Food Insecurity (05/10/2021)   Hunger Vital Sign    Worried About Running Out of Food in the Last Year: Never true    Ran Out of Food in the Last Year: Never true  Transportation Needs: No Transportation Needs (05/10/2021)   PRAPARE - Hydrologist (Medical): No    Lack of Transportation (Non-Medical): No  Physical Activity: Inactive (05/04/2020)   Exercise Vital Sign    Days of Exercise per Week: 0 days    Minutes of Exercise per Session: 0 min  Stress: No Stress Concern Present (05/04/2020)   [place]    Feeling of  Stress : Only a little  Social Connections: Moderately Isolated (05/04/2020)   Social Connection and Isolation Panel [NHANES]    Frequency of Communication with Friends and Family: More than three times a week    Frequency of Social Gatherings with Friends and Family: More than three times a week    Attends Religious Services: More than 4 times per year    Active Member of [employer] or Organizations: No    Attends Archivist Meetings: Never    Marital Status: Widowed   Family History  Problem Relation Age of Onset   Lung cancer Father    Lung cancer Mother    Diabetes Brother    Scheduled Meds:  Budeson-Glycopyrrol-Formoterol  2 puff Inhalation BID   carvedilol  12.5 mg Oral BID WC   docusate sodium  100 mg Oral BID   enoxaparin (LOVENOX) injection  40 mg Subcutaneous Q24H   [START ON 06/13/2022] escitalopram  20 mg Oral Daily   fluticasone  2 spray Each Nare Daily   insulin aspart  0-15 Units Subcutaneous TID WC   ipratropium-albuterol  3 mL Nebulization Q6H   loratadine  10 mg Oral Daily   methylPREDNISolone (SOLU-MEDROL) injection  80 mg Intravenous Q12H   Followed by   Derrill Memo ON 06/13/2022] predniSONE  40 mg Oral Q breakfast   montelukast  10 mg Oral Daily   [START ON 06/13/2022] roflumilast  250 mcg Oral Daily   simvastatin  40 mg Oral QPM   sodium chloride flush  3 mL Intravenous Q12H   tiZANidine  2 mg Oral QHS   Continuous Infusions:  ceFEPime (MAXIPIME) IV Stopped (06/12/22 1039)   PRN Meds:.acetaminophen **OR** acetaminophen, albuterol, bisacodyl, guaiFENesin, hydrALAZINE, ondansetron **OR** ondansetron (ZOFRAN) IV, polyethylene glycol Medications Prior to Admission:  Prior to Admission medications   Medication Sig Start Date End Date Taking? Authorizing Provider  acetaminophen (TYLENOL) 500 MG tablet Take 1,000 mg by mouth every 6 (six) hours as needed for mild pain.   Yes [provider]  albuterol (PROVENTIL) (2.5 MG/3ML) 0.083% nebulizer solution  Take 3 mLs (2.5 mg total) by nebulization every 6 (six) hours as needed for wheezing or shortness of breath. 01/03/18  Yes Lauraine Rinne, NP  albuterol (VENTOLIN HFA) 108 (90 Base) MCG/ACT inhaler INHALE 2 PUFFS BY MOUTH EVERY 6 HOURS AS NEEDED FOR WHEEZING OR SHORTNESS OF BREATH Patient taking differently: Inhale 1-2 puffs into the lungs every 6 (six) hours as needed for wheezing or shortness of breath. 06/02/22  Yes Mannam, Praveen, MD  ascorbic acid (VITAMIN C) 500 MG tablet Take 1 tablet (500 mg total) by mouth daily. 04/25/20  Yes Allie Bossier, MD  Budeson-Glycopyrrol-Formoterol (BREZTRI AEROSPHERE) 160-9-4.8 MCG/ACT AERO Inhale 2 puffs into the lungs in the morning and at bedtime. 01/31/22  Yes Mannam, Praveen, MD  carvedilol (COREG) 12.5 MG tablet Take 1 tablet (12.5 mg total) by mouth 2 (two) times daily with a meal. 01/08/22  Yes Ghimire, Henreitta Leber, MD  cholecalciferol (VITAMIN D3) 25 MCG (1000 UNIT) tablet Take 1,000 Units by mouth daily.   Yes [provider]  DALIRESP 250 MCG TABS TAKE 1 TABLET BY MOUTH EVERY MORNING Patient taking differently: Take 250 mg by mouth every morning. 05/17/22  Yes Mannam, Praveen, MD  diclofenac Sodium (VOLTAREN) 1 % GEL Apply to affected area twice a day 06/09/22  Yes Sowell, Erlene Quan, MD  escitalopram (LEXAPRO) 20 MG tablet Take 20 mg by mouth daily. 06/15/21  Yes [provider]  Menthol-Camphor (ICY HOT ADVANCED PAIN RELIEF) 16-11 % CREA Apply 1 Application topically 2 (two) times daily as needed (pain).   Yes [provider]  montelukast (SINGULAIR) 10 MG tablet Take 10 mg by mouth daily. 04/23/21  Yes [provider]  OXYGEN Inhale 4 L into the lungs continuous.   Yes [provider]  oxymetazoline (AFRIN) 0.05 % nasal spray Place 1 spray into both nostrils 2 (two) times daily.   Yes [provider]  simvastatin (ZOCOR) 40 MG tablet Take 40 mg by mouth every evening. 06/04/21  Yes [provider]   tiZANidine (ZANAFLEX) 2 MG tablet Take 1 tablet (2 mg total) by mouth at bedtime. 06/09/22  Yes Sowell, Erlene Quan, MD  vitamin B-12 (CYANOCOBALAMIN) 500 MCG tablet Take 1,000 mcg by mouth daily.   Yes [provider]  azithromycin (ZITHROMAX) 500 MG tablet Take 1 tablet (  500 mg total) by mouth daily. Patient not taking: Reported on 06/12/2022 01/08/22   Jonetta Osgood, MD  fluticasone [facility]) 50 MCG/ACT nasal spray Place 2 sprays into both nostrils daily. Reduce to 1 spray each nostril daily after stopping Afrin Patient not taking: Reported on 06/12/2022 01/08/22   Jonetta Osgood, MD  loratadine (CLARITIN) 10 MG tablet Take 1 tablet (10 mg total) by mouth daily for 15 days. 01/08/22 01/23/22  GhimireHenreitta Leber, MD   Allergies  Allergen Reactions   Adhesive [Tape] Rash    No "PLASTIC" tape!!! Patient broke out in blisters!!   Tegaderm Alginate Ag Rope Rash   Review of Systems  Constitutional:  Positive for activity change. Negative for appetite change.  Respiratory:  Positive for shortness of breath.   Gastrointestinal:  Negative for nausea and vomiting.  Neurological:  Positive for weakness.  All other systems reviewed and are negative.   Physical Exam Vitals and nursing note reviewed.  Constitutional:      General: He is not in acute distress. Pulmonary:     Effort: No respiratory distress.  Skin:    General: Skin is warm and dry.  Neurological:     Mental Status: He is alert and oriented to person, place, and time.     Motor: Weakness present.  Psychiatric:        Attention and Perception: Attention normal.        Behavior: Behavior is cooperative.        Cognition and Memory: Cognition and memory normal.     Vital Signs: BP (!) 152/69   Pulse (!) 102   Temp 97.9 F (36.6 C) (Temporal)   Resp (!) 27   Ht _0  (1.854 m)   Wt 94 kg   SpO2 95%   BMI 27.34 kg/m  Pain Scale: 0-10   Pain Score: 0-No pain   SpO2: SpO2: 95 % O2 Device:SpO2: 95 % O2 Flow  Rate: .O2 Flow Rate (L/min): 4 L/min  IO: Intake/output summary: No intake or output data in the 24 hours ending 06/12/22 1234  LBM:   Baseline Weight: Weight: 94 kg Most recent weight: Weight: 94 kg     Palliative Assessment/Data: PPS 60-70%     Time In: 1245 Time Out: 1415 Time Total: 90 minutes  Greater than 50%  of this time was spent counseling and coordinating care related to the above assessment and plan.  Signed by: Lin Landsman, NP   Please contact Palliative Medicine Team phone at [phone] for questions and concerns.  For individual provider: See Amion  *Portions of this note are a verbal dictation therefore any spelling and/or grammatical errors are due to the "[place]" system interpretation.

## 2022-06-12 NOTE — Care Management Obs Status (Signed)
[place] NOTIFICATION   Patient Details  Name: Cory Kemp MRN: [id number] Date of Birth: Nov 28, 1940   Medicare Observation Status Notification Given:  Yes    Verdell Carmine, RN 06/12/2022, 2:36 PM

## 2022-06-12 NOTE — ED Provider Notes (Signed)
[facility] EMERGENCY DEPARTMENT Provider Note   CSN: [id number] Arrival date & time: 06/12/22  0617     History  Chief Complaint  Patient presents with   Shortness of Breath    Cory Kemp is a 81 y.o. male.  Patient presents to the emergency department for evaluation of shortness of breath.  Patient comes to the ER by ambulance from home.  EMS reports that the patient was tripoding, respiratory distress with oxygen saturations of 80% on his normal 4 L by nasal cannula when they arrived.  He was given magnesium and they attempted to give Solu-Medrol but the IV infiltrated.  He was given a DuoNeb during transport.  Patient arrives to the ED on CPAP.  He reports that he is much improved.       Home Medications Prior to Admission medications   Medication Sig Start Date End Date Taking? Authorizing Provider  acetaminophen (TYLENOL) 500 MG tablet Take 1,000 mg by mouth every 6 (six) hours as needed for mild pain.    [provider]  albuterol (PROVENTIL) (2.5 MG/3ML) 0.083% nebulizer solution Take 3 mLs (2.5 mg total) by nebulization every 6 (six) hours as needed for wheezing or shortness of breath. 01/03/18   Lauraine Rinne, NP  albuterol (VENTOLIN HFA) 108 (90 Base) MCG/ACT inhaler INHALE 2 PUFFS BY MOUTH EVERY 6 HOURS AS NEEDED FOR WHEEZING OR SHORTNESS OF BREATH 06/02/22   Mannam, Praveen, MD  arformoterol (BROVANA) 15 MCG/2ML NEBU Take 2 mLs (15 mcg total) by nebulization 2 (two) times daily. Patient not taking: Reported on 01/31/2022 10/27/21   [name], MD  ascorbic acid (VITAMIN C) 500 MG tablet Take 1 tablet (500 mg total) by mouth daily. 04/25/20   Allie Bossier, MD  azithromycin (ZITHROMAX) 500 MG tablet Take 1 tablet (500 mg total) by mouth daily. 01/08/22   Ghimire, Henreitta Leber, MD  Budeson-Glycopyrrol-Formoterol (BREZTRI AEROSPHERE) 160-9-4.8 MCG/ACT AERO Inhale 2 puffs into the lungs in the morning and at bedtime. 01/31/22   Mannam, Hart Robinsons, MD   budesonide (PULMICORT) 0.5 MG/2ML nebulizer solution Take 2 mLs (0.5 mg total) by nebulization daily. Patient not taking: Reported on 01/31/2022 10/27/21   [name], MD  carvedilol (COREG) 12.5 MG tablet Take 1 tablet (12.5 mg total) by mouth 2 (two) times daily with a meal. 01/08/22   Ghimire, Henreitta Leber, MD  cholecalciferol (VITAMIN D3) 25 MCG (1000 UNIT) tablet Take 1,000 Units by mouth daily.    [provider]  DALIRESP 250 MCG TABS TAKE 1 TABLET BY MOUTH EVERY MORNING 05/17/22   Mannam, Hart Robinsons, MD  diclofenac Sodium (VOLTAREN) 1 % GEL Apply to affected area twice a day 06/09/22   Holley Bouche, MD  escitalopram (LEXAPRO) 20 MG tablet Take 20 mg by mouth daily. 06/15/21   [provider]  fluticasone (FLONASE) 50 MCG/ACT nasal spray Place 2 sprays into both nostrils daily. Reduce to 1 spray each nostril daily after stopping Afrin 01/08/22   Ghimire, Henreitta Leber, MD  loratadine (CLARITIN) 10 MG tablet Take 1 tablet (10 mg total) by mouth daily for 15 days. 01/08/22 01/23/22  Ghimire, Henreitta Leber, MD  montelukast (SINGULAIR) 10 MG tablet Take 10 mg by mouth daily. 04/23/21   [provider]  OXYGEN Inhale 4 L into the lungs continuous.    [provider]  revefenacin (YUPELRI) 175 MCG/3ML nebulizer solution Take 3 mLs (175 mcg total) by nebulization daily. Patient not taking: Reported on 01/31/2022 10/27/21   Mannam,  Praveen, MD  simvastatin (ZOCOR) 40 MG tablet Take 40 mg by mouth every evening. 06/04/21   [provider]  tiZANidine (ZANAFLEX) 2 MG tablet Take 1 tablet (2 mg total) by mouth at bedtime. 06/09/22   Holley Bouche, MD  vitamin B-12 (CYANOCOBALAMIN) 500 MCG tablet Take 1,000 mcg by mouth daily.    [provider]      Allergies    Adhesive [tape] and Tegaderm alginate ag rope    Review of Systems   Review of Systems  Physical Exam Updated Vital Signs BP (!) 145/69   Pulse 91   Temp 97.7 F (36.5 C) (Temporal)   Resp 20    Ht '6\' 1"'$  (1.854 m)   Wt 94 kg   SpO2 96%   BMI 27.34 kg/m  Physical Exam Vitals and nursing note reviewed.  Constitutional:      General: He is not in acute distress.    Appearance: He is well-developed.  HENT:     Head: Normocephalic and atraumatic.     Mouth/Throat:     Mouth: Mucous membranes are moist.  Eyes:     General: Vision grossly intact. Gaze aligned appropriately.     Extraocular Movements: Extraocular movements intact.     Conjunctiva/sclera: Conjunctivae normal.  Cardiovascular:     Rate and Rhythm: Normal rate and regular rhythm.     Pulses: Normal pulses.     Heart sounds: Normal heart sounds, S1 normal and S2 normal. No murmur heard.    No friction rub. No gallop.  Pulmonary:     Effort: Pulmonary effort is normal. No respiratory distress.     Breath sounds: Decreased breath sounds and rhonchi present.  Abdominal:     Palpations: Abdomen is soft.     Tenderness: There is no abdominal tenderness. There is no guarding or rebound.     Hernia: No hernia is present.  Musculoskeletal:        General: No swelling.     Cervical back: Full passive range of motion without pain, normal range of motion and neck supple. No pain with movement, spinous process tenderness or muscular tenderness. Normal range of motion.     Right lower leg: No edema.     Left lower leg: No edema.  Skin:    General: Skin is warm and dry.     Capillary Refill: Capillary refill takes less than 2 seconds.     Findings: No ecchymosis, erythema, lesion or wound.  Neurological:     Mental Status: He is alert and oriented to person, place, and time.     GCS: GCS eye subscore is 4. GCS verbal subscore is 5. GCS motor subscore is 6.     Cranial Nerves: Cranial nerves 2-12 are intact.     Sensory: Sensation is intact.     Motor: Motor function is intact. No weakness or abnormal muscle tone.     Coordination: Coordination is intact.  Psychiatric:        Mood and Affect: Mood normal.        Speech:  Speech normal.        Behavior: Behavior normal.     ED Results / Procedures / Treatments   Labs (all labs ordered are listed, but only abnormal results are displayed) Labs Reviewed  CBC WITH DIFFERENTIAL/PLATELET - Abnormal; Notable for the following components:      Result Value   Monocytes Absolute 2.8 (*)    Abs Immature Granulocytes 0.11 (*)  All other components within normal limits  COMPREHENSIVE METABOLIC PANEL - Abnormal; Notable for the following components:   Glucose, Bld 139 (*)    All other components within normal limits  BRAIN NATRIURETIC PEPTIDE - Abnormal; Notable for the following components:   B Natriuretic Peptide 150.1 (*)    All other components within normal limits  I-STAT ARTERIAL BLOOD GAS, ED - Abnormal; Notable for the following components:   pCO2 arterial 53.2 (*)    pO2, Arterial 198 (*)    Bicarbonate 31.3 (*)    TCO2 33 (*)    Acid-Base Excess 5.0 (*)    HCT 38.0 (*)    Hemoglobin 12.9 (*)    All other components within normal limits  TROPONIN I (HIGH SENSITIVITY)  TROPONIN I (HIGH SENSITIVITY)    EKG EKG Interpretation  Date/Time:  Sunday June 12 2022 06:33:19 EST Ventricular Rate:  98 PR Interval:  177 QRS Duration: 96 QT Interval:  362 QTC Calculation: 463 R Axis:   74 Text Interpretation: Sinus tachycardia Ventricular tachycardia, unsustained Anterior infarct, old Confirmed by Orpah Greek [id number]) on 06/12/2022 7:37:39 AM  Radiology DG Chest Port 1 View  Result Date: 06/12/2022 CLINICAL DATA:  Shortness of breath.  Respiratory distress. EXAM: PORTABLE CHEST 1 VIEW COMPARISON:  06/09/2022 FINDINGS: Lungs are hyperexpanded. Similar interstitial prominence and subtle ground-glass opacity right mid lung and left base. Blunting of the right costophrenic angle may be related to hyperinflation or tiny effusion. Cardiopericardial silhouette is at upper limits of normal for size. Telemetry leads overlie the chest. IMPRESSION: No  substantial interval change in exam. Electronically Signed   By: Misty Stanley M.D.   On: 06/12/2022 06:51    Procedures Procedures    Medications Ordered in ED Medications  cefTRIAXone (ROCEPHIN) 1 g in sodium chloride 0.9 % 100 mL IVPB (1 g Intravenous New Bag/Given 06/12/22 0750)  azithromycin (ZITHROMAX) 500 mg in sodium chloride 0.9 % 250 mL IVPB (500 mg Intravenous New Bag/Given 06/12/22 0758)  methylPREDNISolone sodium succinate (SOLU-MEDROL) 125 mg/2 mL injection 125 mg (125 mg Intravenous Given 06/12/22 6712)    ED Course/ Medical Decision Making/ A&P                           Medical Decision Making Amount and/or Complexity of Data Reviewed Labs: ordered. Radiology: ordered.  Risk Prescription drug management.   Patient brought to the emergency department by ambulance.  Patient has a history of COPD, oxygen dependent.  He is normally on 4 L.  EMS reports that he was hypoxic and in distress despite using his 4 L at home.  Upon arrival he is improved, maintained on CPAP.  Patient's lung sounds reveal diffuse rhonchi, no active wheezing currently.  Placed on BiPAP.  Chest x-ray groundglass opacity in the right mid and lower lung fields, seen 3 days ago on x-ray as well.  No significant worsening.  COPD exacerbation versus pneumonia.  Empiric antibiotic coverage.  Admit to hospital.  CRITICAL CARE Performed by: Orpah Greek   Total critical care time: 32 minutes  Critical care time was exclusive of separately billable procedures and treating other patients.  Critical care was necessary to treat or prevent imminent or life-threatening deterioration.  Critical care was time spent personally by me on the following activities: development of treatment plan with patient and/or surrogate as well as nursing, discussions with consultants, evaluation of patient's response to treatment, examination of patient, obtaining history from  patient or surrogate, ordering and  performing treatments and interventions, ordering and review of laboratory studies, ordering and review of radiographic studies, pulse oximetry and re-evaluation of patient's condition.         Final Clinical Impression(s) / ED Diagnoses Final diagnoses:  COPD exacerbation [facility])    Rx / DC Orders ED Discharge Orders     None         Ashlye Oviedo, Gwenyth Allegra, MD 06/12/22 [phone]

## 2022-06-12 NOTE — TOC Initial Note (Addendum)
Transition of Care [facility]) - Initial/Assessment Note    Patient Details  Name: Cory Kemp MRN: [id number] Date of Birth: 08/06/1941  Transition of Care [facility]) CM/SW Contact:    Verdell Carmine, RN Phone Number: 06/12/2022, 1:55 PM  Clinical Narrative:                 Patient presented with [facility] on home oxygen at 4LPM. He lives with a room-mate, he rents out a room, and the roommate helps by sweeping and mopping.  has a daughter. Palliative spoke to patient, Bertram Savin ongoing but wants consult with OP hospice care.  Gave Medicare list to consider hospice provider.  Patient stated he has done many things in his life and he is not regretful. He is very into native [place] culture and used to go out [place] regularly, playing drums in native [employer]. Currently he drives around with his oxygen, still likes exploring even though he is limited on what he can do.   TOC will follow for needs, recommendations, and transitions of care Expected Discharge Plan: Home w Hospice Care Barriers to Discharge: Continued Medical Work up   Patient Goals and CMS Choice        Expected Discharge Plan and Services Expected Discharge Plan: [place]                                              Prior Living Arrangements/Services     Patient language and need for interpreter reviewed:: Yes        Need for Family Participation in Patient Care: Yes (Comment) Care giver support system in place?: Yes (comment)   Criminal Activity/Legal Involvement Pertinent to Current Situation/Hospitalization: No - Comment as needed  Activities of Daily Living      Permission Sought/Granted                  Emotional Assessment       Orientation: : Oriented to Self, Oriented to Place   Psych Involvement: No (comment)  Admission diagnosis:  Multifocal pneumonia [J18.9] Patient Active Problem List   Diagnosis Date Noted   Dyslipidemia 06/12/2022   Dementia without  behavioral disturbance ([place]) 06/12/2022   COPD exacerbation ([place]) 01/07/2022   SVT (supraventricular tachycardia) 07/12/2021   Acute on chronic respiratory failure with hypoxia ([place]) 07/11/2021   Chronic neck pain 07/11/2021   History of malignant neoplasm of prostate 07/11/2021   Oxygen dependent 07/11/2021   Pure hypercholesterolemia 07/11/2021   Elevated troponin 04/20/2020   PVC's (premature ventricular contractions) 04/20/2020   Pneumonia due to COVID-19 virus 04/20/2020   Medication management 03/05/2019   COPD with acute exacerbation ([place]) 02/08/2019   Hypertensive urgency 02/08/2019   CAP (community acquired pneumonia) 12/25/2018   Elevated d-dimer 12/25/2018   Chronic respiratory failure with hypoxia ([place]) 12/13/2018   Abnormal finding on EKG 01/03/2018   Tachycardia 04/11/2016   Essential hypertension 04/11/2016   COPD (chronic obstructive pulmonary disease) ([place]) 01/11/2016   B12 deficiency 11/03/2013   BPH (benign prostatic hyperplasia) 01/31/2013   Leukopenia 01/29/2013   Monocytosis 01/29/2013   Normochromic anemia 01/29/2013   Former smoker 03/27/2012   Diabetes mellitus ([place]) 01/10/2012   PCP:  Jani Gravel, MD Pharmacy:   [facility], [place] [address] Phone: [phone] Fax:  [phone]  [facility], [place] [address] Phone: [phone] Fax: [place], [place] [address] [place] [address] Phone: [phone] Fax: [place], [place] [address] Phone: [phone] Fax: [phone]     Social Determinants of Health (SDOH) Interventions    Readmission Risk Interventions      No data to display

## 2022-06-12 NOTE — ED Triage Notes (Signed)
Pt arrives via [facility] from home. Pt has history of COPD, called out for shortness of breath. On arrival, the pt oxygen was 88% on 4 liters (home o2). Pt had decreased breath sounds throughout. He was given Solumedrol. IV magnesium 2gm and duoneb. Oxygen improved to 97% 41 on capnography, 208/110 initial improved to 208/110, hr 111

## 2022-06-12 NOTE — Progress Notes (Signed)
Pt taken off BiPAP by RT and placed on 4L [place]. Pt tolerating well at this time, SpO2 98%, vitals stable, no increased WOB noted, RN aware, RT will monitor.

## 2022-06-12 NOTE — H&P (Signed)
History and Physical    Patient: Cory Kemp UKG:[id number] DOB: 1941/05/22 DOA: 06/12/2022 DOS: the patient was seen and examined on 06/12/2022 PCP: Cory Gravel, MD  Patient coming from: Home - lives [place] a roommate, [place]; [place]: Daughter, Cory Kemp, [place]   Chief Complaint: SOB  HPI: Cory Kemp is a 81 y.o. male with medical history significant of remote ETOH dependence; BPH; COPD on 4L home O2; dementia; DM; HTN; and HLD presenting with SOB.   He was seen in the ER on 11/2 for torticollis and had a CXR for reported SOB at that time; CXR showed subtle increase in interstitial markings suggestive of scarring.  He is quite conversant and a bit tangential.  He reports chronic SOB for about 5-6 years.  He usually gets up in the AM with O2 sats in the 80s and has to do breathing exercises to get them into the 90s before he begins to move around and start his day.  Overnight, he had acutely worse SOB, which happens periodically.  The person who rooms with him called 911.  He has chronic cough productive of sputum and this may be slightly worse but not substantially so.  No fever.    ER Course:  h/o COPD on 4L at home.  SOB last night, tripoding and coughing with productive sputum.  Placed on BIPAP.  Got solumedrol, nebs, Mag++.  Appears to have PNA, given abx.  Back on 4L, off BIPAP and feeling better.     Review of Systems: As mentioned in the history of present illness. All other systems reviewed and are negative. Past Medical History:  Diagnosis Date   Alcohol abuse    Anemia    Arthritis    Asthma    B12 deficiency 11/03/2013   BPH (benign prostatic hypertrophy)    COPD (chronic obstructive pulmonary disease) (HCC)    Dementia (HCC)    ?   Diabetes mellitus ([place]) 01/10/2012   Femoral hernia, right 03/27/2012   Glucose intolerance (impaired glucose tolerance)    Hyperlipidemia    Hypertension    Incisional hernias - swiss-cheese-type periumbilical 02/10/2830   Inguinal hernia  - right 01/10/2012   Leukopenia 01/29/2013   Monocytosis 01/29/2013   Normochromic anemia 01/29/2013   Osteopenia    Past Surgical History:  Procedure Laterality Date   HEMORRHOID SURGERY     HERNIA REPAIR     INGUINAL HERNIA REPAIR  02/24/2012   Procedure: LAPAROSCOPIC INGUINAL HERNIA;  Surgeon: Cory Hector, MD;  Location: [facility];  Service: General;  Laterality: N/A;   TUMOR EXCISION     intestinal after SBO ( Benign), appendectomy   VENTRAL HERNIA REPAIR  02/24/2012   Procedure: LAPAROSCOPIC VENTRAL HERNIA;  Surgeon: Cory Hector, MD;  Location: [facility];  Service: General;  Laterality: N/A;  lysis of adhesions, excision of abdominal wall lipomae   Social History:  reports that he quit smoking about 3 years ago. His smoking use included cigarettes. He started smoking about 66 years ago. He has a 63.00 pack-year smoking history. He has never used smokeless tobacco. He reports that he does not drink alcohol and does not use drugs.  Allergies  Allergen Reactions   Adhesive [Tape] Rash    No "PLASTIC" tape!!! Patient broke out in blisters!!   Tegaderm Alginate Ag Rope Rash    Family History  Problem Relation Age of Onset   Lung cancer Father    Lung cancer Mother    Diabetes Brother  Prior to Admission medications   Medication Sig Start Date End Date Taking? Authorizing Provider  acetaminophen (TYLENOL) 500 MG tablet Take 1,000 mg by mouth every 6 (six) hours as needed for mild pain.    [provider]  albuterol (PROVENTIL) (2.5 MG/3ML) 0.083% nebulizer solution Take 3 mLs (2.5 mg total) by nebulization every 6 (six) hours as needed for wheezing or shortness of breath. 01/03/18   Cory Rinne, NP  albuterol (VENTOLIN HFA) 108 (90 Base) MCG/ACT inhaler INHALE 2 PUFFS BY MOUTH EVERY 6 HOURS AS NEEDED FOR WHEEZING OR SHORTNESS OF BREATH 06/02/22   Cory Kemp, Praveen, MD  arformoterol (BROVANA) 15 MCG/2ML NEBU Take 2 mLs (15 mcg total) by nebulization 2 (two) times  daily. Patient not taking: Reported on 01/31/2022 10/27/21   Cory Garfinkel, MD  ascorbic acid (VITAMIN C) 500 MG tablet Take 1 tablet (500 mg total) by mouth daily. 04/25/20   Cory Bossier, MD  azithromycin (ZITHROMAX) 500 MG tablet Take 1 tablet (500 mg total) by mouth daily. 01/08/22   Cory Kemp, Cory Leber, MD  Budeson-Glycopyrrol-Formoterol (BREZTRI AEROSPHERE) 160-9-4.8 MCG/ACT AERO Inhale 2 puffs into the lungs in the morning and at bedtime. 01/31/22   Cory Kemp, Cory Robinsons, MD  budesonide (PULMICORT) 0.5 MG/2ML nebulizer solution Take 2 mLs (0.5 mg total) by nebulization daily. Patient not taking: Reported on 01/31/2022 10/27/21   Cory Garfinkel, MD  carvedilol (COREG) 12.5 MG tablet Take 1 tablet (12.5 mg total) by mouth 2 (two) times daily with a meal. 01/08/22   Cory Kemp, Cory Leber, MD  cholecalciferol (VITAMIN D3) 25 MCG (1000 UNIT) tablet Take 1,000 Units by mouth daily.    [provider]  DALIRESP 250 MCG TABS TAKE 1 TABLET BY MOUTH EVERY MORNING 05/17/22   Cory Kemp, Cory Robinsons, MD  diclofenac Sodium (VOLTAREN) 1 % GEL Apply to affected area twice a day 06/09/22   Cory Bouche, MD  escitalopram (LEXAPRO) 20 MG tablet Take 20 mg by mouth daily. 06/15/21   [provider]  fluticasone (FLONASE) 50 MCG/ACT nasal spray Place 2 sprays into both nostrils daily. Reduce to 1 spray each nostril daily after stopping Afrin 01/08/22   Cory Kemp, Cory Leber, MD  loratadine (CLARITIN) 10 MG tablet Take 1 tablet (10 mg total) by mouth daily for 15 days. 01/08/22 01/23/22  Cory Kemp, Cory Leber, MD  montelukast (SINGULAIR) 10 MG tablet Take 10 mg by mouth daily. 04/23/21   [provider]  OXYGEN Inhale 4 L into the lungs continuous.    [provider]  revefenacin (YUPELRI) 175 MCG/3ML nebulizer solution Take 3 mLs (175 mcg total) by nebulization daily. Patient not taking: Reported on 01/31/2022 10/27/21   Cory Garfinkel, MD  simvastatin (ZOCOR) 40 MG tablet Take 40 mg by mouth every evening.  06/04/21   [provider]  tiZANidine (ZANAFLEX) 2 MG tablet Take 1 tablet (2 mg total) by mouth at bedtime. 06/09/22   Cory Bouche, MD  vitamin B-12 (CYANOCOBALAMIN) 500 MCG tablet Take 1,000 mcg by mouth daily.    [provider]    Physical Exam: Vitals:   06/12/22 0730 06/12/22 0753 06/12/22 0800 06/12/22 0830  BP: (!) 145/69  (!) 149/77 (!) 146/74  Pulse: 91  88 (!) 103  Resp: 20  (!) 28 (!) 21  Temp:      TempSrc:      SpO2: 96%  95% 94%  Weight:  94 kg    Height:  '6\' 1"'$  (1.854 m)     General:  Appears calm  and comfortable and is in NAD, appears chronically ill and on his home 4L [place] O2 with BIPAP at the bedside Eyes:   EOMI, normal lids, iris ENT:  grossly normal hearing, lips & tongue, mmm Neck:  no LAD, masses or thyromegaly Cardiovascular:  RRR, no m/r/g. No LE edema.  Respiratory:   Scattered wheezes, moderate air movement.  Normal to mildly increased respiratory effort. Abdomen:  soft, NT, ND Skin:  no rash or induration seen on limited exam Musculoskeletal:  grossly normal tone BUE/BLE, good ROM, no bony abnormality Psychiatric:  grossly normal mood and affect, speech fluent and appropriate but tangential and loquacious Neurologic:  CN 2-12 grossly intact, moves all extremities in coordinated fashion   Radiological Exams on Admission: Independently reviewed - see discussion in A/P where applicable  DG Chest Port 1 View  Result Date: 06/12/2022 CLINICAL DATA:  Shortness of breath.  Respiratory distress. EXAM: PORTABLE CHEST 1 VIEW COMPARISON:  06/09/2022 FINDINGS: Lungs are hyperexpanded. Similar interstitial prominence and subtle ground-glass opacity right mid lung and left base. Blunting of the right costophrenic angle may be related to hyperinflation or tiny effusion. Cardiopericardial silhouette is at upper limits of normal for size. Telemetry leads overlie the chest. IMPRESSION: No substantial interval change in exam. Electronically Signed    By: Misty Stanley M.D.   On: 06/12/2022 06:51    EKG: Independently reviewed.  NSR with rate 98; PVCs; nonspecific ST changes with no evidence of acute ischemia   Labs on Admission: I have personally reviewed the available labs and imaging studies at the time of the admission.  Pertinent labs:    ABG: 7.375/53.2/198/31.3 Glucose 139 BNP 150.1 HS troponin 15 Normal CBC   Assessment and Plan: Active Problems:   Diabetes mellitus ([place])   Essential hypertension   COPD with acute exacerbation (HCC)   Acute on chronic respiratory failure with hypoxia (HCC)   Dyslipidemia   Dementia without behavioral disturbance (HCC)    Acute on chronic respiratory failure associated with a COPD exacerbation -Patient's acute overnight shortness of breath and productive cough are most likely caused by acute COPD exacerbation.  -He has history of O2-dependent COPD and was hypoxic into the 80s with EMS despite his usual 4L and was in extremis; he was placed on BIPAP with marked and rapid improvement and is currently back on his home O2 -He does not have fever or leukocytosis.  -Chest x-ray is not consistent with pneumonia; CXR on 11/2 showed subtle increase in interstitial markings suggestive of scarring and this is consistent with today's film -will admit patient - with his advanced underlying disease, it seems likely that he will need several days of hospitalization to show sufficient improvement for discharge. -Nebulizers: scheduled Duoneb and prn albuterol -Solu-Medrol 80 mg IV BID -> Prednisone 40 mg PO daily -IV Cefepime -Continue Breztri, Singulair, Daliresp, Claritin, Flonase -Coordinated care with TOC team/PT/OT/Nutrition/RT consults -Will place palliative care consult  DM -recent A1c was 5.7, indicating good control -hold Glucophage -Cover with moderate-scale SSI   HTN -Continue carvedilol  HLD -Continue simvastatin  Dementia -Continue escitalopram -Will order delirium  precautions     Advance Care Planning:   Code Status: Full Code   Consults: Palliative care; TOC team/PT/OT/Nutrition/RT  DVT Prophylaxis: Lovenox  Family Communication: None present but his roommate called while I was in the room and is aware of the plan  Severity of Illness: The appropriate patient status for this patient is INPATIENT. Inpatient status is judged to be reasonable and necessary  in order to provide the required intensity of service to ensure the patient's safety. The patient's presenting symptoms, physical exam findings, and initial radiographic and laboratory data in the context of their chronic comorbidities is felt to place them at high risk for further clinical deterioration. Furthermore, it is not anticipated that the patient will be medically stable for discharge from the hospital within 2 midnights of admission.   * I certify that at the point of admission it is my clinical judgment that the patient will require inpatient hospital care spanning beyond 2 midnights from the point of admission due to high intensity of service, high risk for further deterioration and high frequency of surveillance required.*  Author: Karmen Bongo, MD 06/12/2022 9:50 AM  For on call review [email or web address].

## 2022-06-12 NOTE — ED Notes (Signed)
Pt moved to hospital bed for comfort.

## 2022-06-13 DIAGNOSIS — J189 Pneumonia, unspecified organism: Secondary | ICD-10-CM | POA: Diagnosis not present

## 2022-06-13 LAB — CBC
HCT: 39 % (ref 39.0–52.0)
Hemoglobin: 12.6 g/dL — ABNORMAL LOW (ref 13.0–17.0)
MCH: 29.4 pg (ref 26.0–34.0)
MCHC: 32.3 g/dL (ref 30.0–36.0)
MCV: 91.1 fL (ref 80.0–100.0)
Platelets: 160 10*3/uL (ref 150–400)
RBC: 4.28 MIL/uL (ref 4.22–5.81)
RDW: 13.9 % (ref 11.5–15.5)
WBC: 8 10*3/uL (ref 4.0–10.5)
nRBC: 0 % (ref 0.0–0.2)

## 2022-06-13 LAB — GLUCOSE, CAPILLARY
Glucose-Capillary: 197 mg/dL — ABNORMAL HIGH (ref 70–99)
Glucose-Capillary: 326 mg/dL — ABNORMAL HIGH (ref 70–99)
Glucose-Capillary: 83 mg/dL (ref 70–99)
Glucose-Capillary: 226 mg/dL — ABNORMAL HIGH (ref 70–99)

## 2022-06-13 LAB — BASIC METABOLIC PANEL
Anion gap: 6 (ref 5–15)
BUN: 30 mg/dL — ABNORMAL HIGH (ref 8–23)
CO2: 28 mmol/L (ref 22–32)
Calcium: 9.1 mg/dL (ref 8.9–10.3)
Chloride: 106 mmol/L (ref 98–111)
Creatinine, Ser: 1.25 mg/dL — ABNORMAL HIGH (ref 0.61–1.24)
GFR, Estimated: 58 mL/min — ABNORMAL LOW (ref >60)
Glucose, Bld: 156 mg/dL — ABNORMAL HIGH (ref 70–99)
Potassium: 5.2 mmol/L — ABNORMAL HIGH (ref 3.5–5.1)
Sodium: 140 mmol/L (ref 135–145)

## 2022-06-13 LAB — POTASSIUM: Potassium: 4.8 mmol/L (ref 3.5–5.1)

## 2022-06-13 MED ORDER — GLUCERNA SHAKE PO LIQD
237.0000 mL | Freq: Two times a day (BID) | ORAL | Status: DC
  Administered 2022-06-13 – 2022-06-16 (×4): 237 mL via ORAL

## 2022-06-13 MED ORDER — RENA-VITE PO TABS
1.0000 | ORAL_TABLET | Freq: Every day | ORAL | Status: DC
  Administered 2022-06-13 – 2022-06-15 (×3): 1 via ORAL
  Filled 2022-06-13 (×3): qty 1

## 2022-06-13 MED ORDER — BENZONATATE 100 MG PO CAPS: 100.0000 mg | ORAL_CAPSULE | Freq: Three times a day (TID) | ORAL | Status: DC | PRN

## 2022-06-13 NOTE — Progress Notes (Signed)
This chaplain responded to PMT consult for spiritual care and creating/updating the Pt. Advance Directive.   The Pt. is awake and sitting in the bedside recliner. Family is not present. The Pt. son-Kelly and daughter-[name] phoned the Pt. during the chaplain visit.  The chaplain understands from [place] will visit later today.   The Pt. in his storytelling shares no regrets and joy in living life to the fullest with his diagnosis. The Pt. celebrates his life with his children, grand, and great grand children in and among his routine in community.  The chaplain understands the Pt. choice of medical decision makers is shared among his children; Claiborne [place] and [place], the Pt. surrogate decision makers following [place].   A follow up voice mail and request for a returned phone call was left with the Pt. daughter-[name] at the number in Epic. The chaplain hopes to gain clarity in the Pt. choice of completing an AD.  Chaplain Sallyanne Kuster [phone]

## 2022-06-13 NOTE — Plan of Care (Signed)
  Problem: Activity: Goal: Ability to tolerate increased activity will improve Outcome: Progressing   

## 2022-06-13 NOTE — Progress Notes (Signed)
Initial Nutrition Assessment  DOCUMENTATION CODES:   Not applicable  INTERVENTION:  - Add Glucerna Shake po BID, each supplement provides 220 kcal and 10 grams of protein  - Add Renal MVI q day   NUTRITION DIAGNOSIS:   Increased nutrient needs related to chronic illness as evidenced by estimated needs.  GOAL:   Patient will meet greater than or equal to 90% of their needs  MONITOR:   PO intake, Supplement acceptance  REASON FOR ASSESSMENT:   Consult Assessment of nutrition requirement/status  ASSESSMENT:   81 y.o. admits related to SOB. PMH includes: ETOH dependence, BPH, COPD on 4L home O2, dementia, DM, HTN, HLD. Pt is currently receiving medical management for acute on chronic respiratory failure associated with COPD exacerbation.  Meds include: colace, sliding scale insulin. Labs reviewed: K high.   Pt reports that he has a good appetite and has been eating well since admission. Pt states that he ate 100% of his breakfast this am. Pt denies any wt loss. No wt loss per record. RD will add Glucerna BID for now due to increased nutrient needs in setting of COPD exacerbation. Will continue to monitor PO intakes.   NUTRITION - FOCUSED PHYSICAL EXAM:  Flowsheet Row Most Recent Value  Orbital Region Mild depletion  Upper Arm Region Mild depletion  Thoracic and Lumbar Region No depletion  Buccal Region No depletion  Temple Region Mild depletion  Clavicle Bone Region No depletion  Clavicle and Acromion Bone Region No depletion  Scapular Bone Region No depletion  Dorsal Hand No depletion  Patellar Region No depletion  Anterior Thigh Region No depletion  Posterior Calf Region No depletion  Edema (RD Assessment) None  Hair Reviewed  Eyes Reviewed  Mouth Reviewed  Skin Reviewed  Nails Reviewed       Diet Order:   Diet Order             Diet Carb Modified Fluid consistency: Thin; Room service appropriate? Yes  Diet effective now                    EDUCATION NEEDS:   Not appropriate for education at this time  Skin:  Skin Assessment: Reviewed RN Assessment  Last BM:  unknown  Height:   Ht Readings from Last 1 Encounters:  06/12/22 '6\' 1"'$  (1.854 m)    Weight:   Wt Readings from Last 1 Encounters:  06/12/22 94 kg    Ideal Body Weight:  83.6 kg  BMI:  Body mass index is 27.34 kg/m.  Estimated Nutritional Needs:   Kcal:  3614-4315 kcals  Protein:  115-140 gm  Fluid:  >/= 2.3 L  Thalia Bloodgood, RD, LDN, CNSC

## 2022-06-13 NOTE — Progress Notes (Signed)
PROGRESS NOTE    Cory Kemp  JOI:[id number] DOB: 10-11-1940 DOA: 06/12/2022 PCP: Jani Gravel, MD   Brief Narrative:  This 81 yrs old male with PMH significant of remote ETOH dependence; BPH; COPD on 4L home O2; dementia; DM; HTN; and HLD presented in the ED with SOB.   He was seen in the ER on 11/2 for torticollis and had a chest xray for reported SOB at that time; CXR showed subtle increase in interstitial markings suggestive of scarring. He reports having chronic SOB for about 5-6 years.  He usually gets up in the AM with O2 sats in the 80s and has to do breathing exercises to get them into the 90s before he begins to move around and start his day.  Overnight, he had acutely worse SOB, which happens periodically.  Patient is admitted for COPD exacerbation.  He was also found to have pneumonia started on empiric antibiotics.   Assessment & Plan:   Active Problems:   Diabetes mellitus ([place])   Essential hypertension   COPD with acute exacerbation (HCC)   Acute on chronic respiratory failure with hypoxia (HCC)   Dyslipidemia   Dementia without behavioral disturbance (HCC)   Acute on chronic hypoxic respiratory failure: COPD exacerbation: Patient presented with worsening shortness of breath and productive cough could be due to COPD flare. He has history of O2 dependent COPD and was hypoxic with SPO2 of 80% when EMS arrived on his usual 4 L. He was placed on BiPAP and was found to have rapid improvement and back to his home oxygen requirement. Chest x-ray is not consistent with pneumonia;  CXR on 11/2 showed subtle increase in interstitial markings suggestive of scarring and this is consistent with today's film. Continue nebulizers: scheduled Duoneb and prn albuterol Continue Solu-Medrol 80 mg IV BID -> Prednisone 40 mg PO daily Continue IV Cefepime Continue Breztri, Singulair, Daliresp, Claritin, Flonase Coordinated care with [facility] team/PT/OT/Nutrition/RT consults Palliative care  consulted   Diabetes mellitus: Last hemoglobin A1c 5.7 shows good control. Hold Glucophage. Start regular insulin sliding scale .   Essential hypertension: Continue carvedilol 12.5 mg bid   Hyperlipidemia: Continue simvastatin.  Dementia: Continue escitalopram 20 mg dialy Continue delirium precautions   DVT prophylaxis: Lovenox Code Status: DNR Family Communication: No family at bedside Disposition Plan: Status is: Inpatient Remains inpatient appropriate because: Admitted for COPD exacerbation requiring BiPAP and IV steroids.   Consultants:  None  Procedures: None Antimicrobials: Cefepime.  Subjective: Patient was seen and examined at bedside.  Overnight events noted.  Patient was sitting comfortably on the recliner. He reports doing better,  he remains on 5 L of supplemental oxygen,  at baseline uses 4 L.  Objective: Vitals:   06/13/22 0509 06/13/22 0842 06/13/22 0938 06/13/22 0959  BP: (!) 167/86  (!) 134/57 127/66  Pulse: 85 82 84 78  Resp: '20 19  19  '$ Temp: 98.5 F (36.9 C)   98 F (36.7 C)  TempSrc:    Oral  SpO2: 96% 97% 94% 95%  Weight:      Height:        Intake/Output Summary (Last 24 hours) at 06/13/2022 1334 Last data filed at 06/13/2022 1223 Gross per 24 hour  Intake 220 ml  Output 600 ml  Net -380 ml   Filed Weights   06/12/22 0753  Weight: 94 kg    Examination:  General exam: Appears comfortable, not in any acute distress, deconditioned Respiratory system: CTA bilaterally, respiratory effort normal, RR 15 Cardiovascular  system: S1 & S2 heard, regular rate and rhythm, no murmur. Gastrointestinal system: Abdomen is soft, non tender, non distended, BS+ Central nervous system: Alert and oriented x 3. No focal neurological deficits. Extremities: No edema, no cyanosis, no clubbing Skin: No rashes, lesions or ulcers Psychiatry: Judgement and insight appear normal. Mood & affect appropriate.     Data Reviewed: I have personally reviewed  following labs and imaging studies  CBC: Recent Labs  Lab 06/09/22 1312 06/12/22 0630 06/12/22 0645 06/13/22 0439  WBC 13.3* 10.4  --  8.0  NEUTROABS 8.9* 5.9  --   --   HGB 14.2 13.5 12.9* 12.6*  HCT 44.7 42.1 38.0* 39.0  MCV 93.5 92.5  --  91.1  PLT 167 160  --  314   Basic Metabolic Panel: Recent Labs  Lab 06/09/22 1312 06/12/22 0630 06/12/22 0645 06/13/22 0439 06/13/22 0904  NA 143 138 140 140  --   K 4.5 3.9 3.6 5.2* 4.8  CL 102 101  --  106  --   CO2 26 26  --  28  --   GLUCOSE 94 139*  --  156*  --   BUN 18 17  --  30*  --   CREATININE 0.96 0.83  --  1.25*  --   CALCIUM 9.6 9.0  --  9.1  --    GFR: Estimated Creatinine Clearance: 52.4 mL/min (A) (by C-G formula based on SCr of 1.25 mg/dL (H)). Liver Function Tests: Recent Labs  Lab 06/09/22 1312 06/12/22 0630  AST 18 19  ALT 15 16  ALKPHOS 54 52  BILITOT 0.7 0.8  PROT 6.9 6.8  ALBUMIN 3.9 3.6   No results for input(s): "LIPASE", "AMYLASE" in the last 168 hours. No results for input(s): "AMMONIA" in the last 168 hours. Coagulation Profile: No results for input(s): "INR", "PROTIME" in the last 168 hours. Cardiac Enzymes: No results for input(s): "CKTOTAL", "CKMB", "CKMBINDEX", "TROPONINI" in the last 168 hours. BNP (last 3 results) No results for input(s): "PROBNP" in the last 8760 hours. HbA1C: No results for input(s): "HGBA1C" in the last 72 hours. CBG: Recent Labs  Lab 06/12/22 1125 06/12/22 1813 06/12/22 2143 06/13/22 0800 06/13/22 1119  GLUCAP 163* 195* 194* 197* 326*   Lipid Profile: No results for input(s): "CHOL", "HDL", "LDLCALC", "TRIG", "CHOLHDL", "LDLDIRECT" in the last 72 hours. Thyroid Function Tests: No results for input(s): "TSH", "T4TOTAL", "FREET4", "T3FREE", "THYROIDAB" in the last 72 hours. Anemia Panel: No results for input(s): "VITAMINB12", "FOLATE", "FERRITIN", "TIBC", "IRON", "RETICCTPCT" in the last 72 hours. Sepsis Labs: No results for input(s): "PROCALCITON",  "LATICACIDVEN" in the last 168 hours.  Recent Results (from the past 240 hour(s))  SARS Coronavirus 2 by RT PCR (hospital order, performed in [facility] hospital lab) *cepheid single result test* Anterior Nasal Swab     Status: None   Collection Time: 06/12/22  8:51 AM   Specimen: Anterior Nasal Swab  Result Value Ref Range Status   SARS Coronavirus 2 by RT PCR NEGATIVE NEGATIVE Final    Comment: (NOTE) SARS-CoV-2 target nucleic acids are NOT DETECTED.  The SARS-CoV-2 RNA is generally detectable in upper and lower respiratory specimens during the acute phase of infection. The lowest concentration of SARS-CoV-2 viral copies this assay can detect is 250 copies / mL. A negative result does not preclude SARS-CoV-2 infection and should not be used as the sole basis for treatment or other patient management decisions.  A negative result may occur with improper specimen collection /  handling, submission of specimen other than nasopharyngeal swab, presence of viral mutation(s) within the areas targeted by this assay, and inadequate number of viral copies (<250 copies / mL). A negative result must be combined with clinical observations, patient history, and epidemiological information.  Fact Sheet for Patients:   https://www.patel.info/  Fact Sheet for Healthcare Providers: https://hall.com/  This test is not yet approved or  cleared by the [place] FDA and has been authorized for detection and/or diagnosis of SARS-CoV-2 by FDA under an Emergency Use Authorization (EUA).  This EUA will remain in effect (meaning this test can be used) for the duration of the COVID-19 declaration under Section 564(b)(1) of the Act, 21 U.S.C. section 360bbb-3(b)(1), unless the authorization is terminated or revoked sooner.  Performed at [facility], [place] [address], [place], [place] [address]     Radiology Studies: DG Chest Port 1 View  Result  Date: 06/12/2022 CLINICAL DATA:  Shortness of breath.  Respiratory distress. EXAM: PORTABLE CHEST 1 VIEW COMPARISON:  06/09/2022 FINDINGS: Lungs are hyperexpanded. Similar interstitial prominence and subtle ground-glass opacity right mid lung and left base. Blunting of the right costophrenic angle may be related to hyperinflation or tiny effusion. Cardiopericardial silhouette is at upper limits of normal for size. Telemetry leads overlie the chest. IMPRESSION: No substantial interval change in exam. Electronically Signed   By: Misty Stanley M.D.   On: 06/12/2022 06:51    Scheduled Meds:  carvedilol  12.5 mg Oral BID WC   docusate sodium  100 mg Oral BID   enoxaparin (LOVENOX) injection  40 mg Subcutaneous Q24H   escitalopram  20 mg Oral Daily   feeding supplement (GLUCERNA SHAKE)  237 mL Oral BID BM   fluticasone furoate-vilanterol  1 puff Inhalation Daily   insulin aspart  0-15 Units Subcutaneous TID WC   ipratropium-albuterol  3 mL Nebulization BID   loratadine  10 mg Oral Daily   montelukast  10 mg Oral Daily   multivitamin  1 tablet Oral QHS   predniSONE  40 mg Oral Q breakfast   roflumilast  250 mcg Oral Daily   simvastatin  40 mg Oral QPM   sodium chloride flush  3 mL Intravenous Q12H   tiZANidine  2 mg Oral QHS   umeclidinium bromide  1 puff Inhalation Daily   Continuous Infusions:  ceFEPime (MAXIPIME) IV 2 g (06/13/22 0948)     LOS: 1 day    Time spent: 50 mins    Natanya Holecek, MD [facility]   If 7PM-7AM, please contact night-coverage

## 2022-06-13 NOTE — Progress Notes (Signed)
Inpatient Diabetes Program Recommendations  AACE/ADA: New Consensus Statement on Inpatient Glycemic Control (2015)  Target Ranges:  Prepandial:   less than 140 mg/dL      Peak postprandial:   less than 180 mg/dL (1-2 hours)      Critically ill patients:  140 - 180 mg/dL   Lab Results  Component Value Date   GLUCAP 326 (H) 06/13/2022   HGBA1C 5.7 (H) 01/07/2022    Review of Glycemic Control  Latest Reference Range & Units 06/12/22 18:13 06/12/22 21:43 06/13/22 08:00 06/13/22 11:19  Glucose-Capillary 70 - 99 mg/dL 195 (H) 194 (H) 197 (H) 326 (H)   Diabetes history: None Current orders for Inpatient glycemic control:  Novolog 0-15 units tid with meals  Prednisone 40 mg daily  Inpatient Diabetes Program Recommendations:    Note increase in post-prandial blood sugars.  If post-prandial blood sugars continue to be >180 mg/dL, consider adding Novolog 3 units tid with meals (hold if patient eats less than 50% or NPO).   Thanks,  Adah Perl, RN, BC-ADM Inpatient Diabetes Coordinator Pager [phone]  (8a-5p)

## 2022-06-13 NOTE — TOC Progression Note (Signed)
Transition of Care [facility]) - Progression Note    Patient Details  Name: Cory Kemp MRN: [id number] Date of Birth: 11/21/1940  [facility] CM/SW Contact  Tom-Johnson, Renea Ee, RN Phone Number: 06/13/2022, 3:43 PM  Clinical Narrative:     CM spoke with patient at bedside about home hospice choice. Patient and family chose [facility]. CM called in referral to Freddie Breech who came in and spoke with patient at bedside. Patient and family are agreeable with [facility] for Home Hospice at discharge. CM will continue to follow as patient progresses towards discharge.     Expected Discharge Plan: Home w Hospice Care Barriers to Discharge: Continued Medical Work up  Expected Discharge Plan and Services Expected Discharge Plan: [place] Determinants of Health (SDOH) Interventions    Readmission Risk Interventions     No data to display

## 2022-06-13 NOTE — Evaluation (Signed)
Occupational Therapy Evaluation Patient Details Name: Cory Kemp MRN: [id number] DOB: 03-05-1941 Today's Date: 06/13/2022   History of Present Illness Pt is an 81 yo male admitted with SOB. pt with COPD exacerbation. Pt on 4 L O2 at home. Palliative care consulted and hospice will be coming in upon dc   Clinical Impression   Pt admitted with the above diagnosis and has the deficits listed below. Pt would benefit from cont OT to increase adl independence back to baseline level and obtain more energy conservation techniques so he can d/c to his mobile home with occasional assist from his roommate. Pt is on 4 L O2 at home and is very limited by his COPD. Pt, per chart, has opted for [facility] hospice at d/c.  Pt has several chairs sitting around his mobile home and walks from one chair to another to mobilize in his home. Pt has a rollator but does not use it.  Pt has O2 in the home and then removes it to walk to car and has a tank in his car. Pt spends a lot of day driving around in car just to get out and about.  Pt at min guard level right now and mildly unsafe when on his feet moving from one location to another but feel this is probably his baseline.  Will continue to see with focus on energy conservation techniques.      Recommendations for follow up therapy are one component of a multi-disciplinary discharge planning process, led by the attending physician.  Recommendations may be updated based on patient status, additional functional criteria and insurance authorization.   Follow Up Recommendations  Home health OT    Assistance Recommended at Discharge Intermittent Supervision/Assistance  Patient can return home with the following Help with stairs or ramp for entrance;Assistance with cooking/housework    Functional Status Assessment  Patient has had a recent decline in their functional status and demonstrates the ability to make significant improvements in function in a reasonable and  predictable amount of time.  Equipment Recommendations  BSC/3in1;Other (comment) (pt has not decided if he wants 3:1. Would be helpful but could get later if opposed now)    Recommendations for Other Services       Precautions / Restrictions Precautions Precautions: Fall;Other (comment) (watch O2 sats) Precaution Comments: watch O2 sats Restrictions Weight Bearing Restrictions: No      Mobility Bed Mobility Overal bed mobility: Modified Independent             General bed mobility comments: no assist.    Transfers Overall transfer level: Needs assistance Equipment used: Rolling walker (2 wheels) Transfers: Sit to/from Stand Sit to Stand: Min guard           General transfer comment: Pt is quick to move and mildly impulsive. Therapist feels this is due to pt getting SOB with any amount of activity and is accustomed to walking to his destination (bathroom, kitchen, or next available chair) and then resting. This is how pt mobilzes at home so pt is mildly impulsive when up in between "destinations."      Balance Overall balance assessment: Needs assistance Sitting-balance support: Feet supported, Single extremity supported Sitting balance-Leahy Scale: Good     Standing balance support: Bilateral upper extremity supported, During functional activity Standing balance-Leahy Scale: Fair Standing balance comment: Pt can let go of walker for brief moments.  Pt did not lose balance without an assitive device but is always wanting to sit due to SOB  and fatigue so feel rollator with seat would be a great option. Pt has one but does not utilize it.                           ADL either performed or assessed with clinical judgement   ADL Overall ADL's : Needs assistance/impaired Eating/Feeding: Independent;Sitting   Grooming: Wash/dry hands;Wash/dry face;Oral care;Applying deodorant;Brushing hair;Set up;Sitting Grooming Details (indicate cue type and reason):  fatigues quickly standing Upper Body Bathing: Set up;Sitting   Lower Body Bathing: Minimal assistance;Sit to/from stand;Cueing for compensatory techniques   Upper Body Dressing : Set up;Sitting   Lower Body Dressing: Minimal assistance;Sit to/from stand;Cueing for compensatory techniques   Toilet Transfer: Engineer, manufacturing (4 wheels);Ambulation;BSC/3in1   Toileting- Clothing Manipulation and Hygiene: Minimal assistance;Sit to/from stand Toileting - Clothing Manipulation Details (indicate cue type and reason): min assist to ensure pt is clean   Tub/Shower Transfer Details (indicate cue type and reason): pt sponge bathes only now Functional mobility during ADLs: Min guard General ADL Comments: Pt does well with most adls despite being very SOB and requiring rest breaks. Pt has chairs sitting all over his home so he can take rest breaks when needed. Pt has a rollator that he does not use that he should consider using.     Vision Baseline Vision/History: 1 Wears glasses Ability to See in Adequate Light: 0 Adequate Patient Visual Report: No change from baseline Vision Assessment?: No apparent visual deficits     Perception     Praxis      Pertinent Vitals/Pain Pain Assessment Pain Assessment: No/denies pain     Hand Dominance Left   Extremity/Trunk Assessment Upper Extremity Assessment Upper Extremity Assessment: Overall WFL for tasks assessed   Lower Extremity Assessment Lower Extremity Assessment: Defer to PT evaluation   Cervical / Trunk Assessment Cervical / Trunk Assessment: Normal   Communication Communication Communication: HOH   Cognition Arousal/Alertness: Awake/alert Behavior During Therapy: WFL for tasks assessed/performed Overall Cognitive Status: Within Functional Limits for tasks assessed                                       General Comments  Pt most limited by SOB and fatigue which has been the case for some time now.  Pt generally  sits most of the day and walks short distances in between one chair in his mobile home and another. Pt uses electric O2 in the home, takes off O2 to walk to car and has a tank in the car. Pt drives around a lot just to be out of the house.  He uses drive throughs, knows where a few "outhouses" are if he has to toilet and will get gas.  Otherwise, pt stays in the car and just drives and will stop and sit if good weather.    Exercises     Shoulder Instructions      Home Living Family/patient expects to be discharged to:: Private residence Living Arrangements: Alone Available Help at Discharge: Friend(s);Available PRN/intermittently Type of Home: Mobile home Home Access: Stairs to enter Entrance Stairs-Number of Steps: 5 Entrance Stairs-Rails: Right Home Layout: One level     Bathroom Shower/Tub: Teacher, early years/pre: Standard     Home Equipment: Rollator (4 wheels);Shower seat;Other (comment)   Additional Comments: O2 all the time      Prior Functioning/Environment Prior Level  of Function : Independent/Modified Independent             Mobility Comments: walks without a device; has a Chiropodist; has a rollator ADLs Comments: Indep but does not get in shower        OT Problem List: Decreased activity tolerance;Impaired balance (sitting and/or standing);Decreased safety awareness;Cardiopulmonary status limiting activity      OT Treatment/Interventions: Energy conservation;Self-care/ADL training;Balance training    OT Goals(Current goals can be found in the care plan section) Acute Rehab OT Goals Patient Stated Goal: to get home and have more help OT Goal Formulation: With patient Time For Goal Achievement: 06/27/22 Potential to Achieve Goals: Good ADL Goals Pt Will Perform Lower Body Bathing: with modified independence;sit to/from stand Pt Will Perform Lower Body Dressing: with modified independence;sit to/from stand Additional ADL Goal #1: Pt will  walk to bathroom with rollator/walker and complete all toileting with mod I. Additional ADL Goal #2: Pt will state 3 new things he could do at home to conserve energy at home during adls without cues.  OT Frequency: Min 2X/week    Co-evaluation              AM-PAC OT "6 Clicks" Daily Activity     Outcome Measure Help from another person eating meals?: None Help from another person taking care of personal grooming?: None Help from another person toileting, which includes using toliet, bedpan, or urinal?: A Little Help from another person bathing (including washing, rinsing, drying)?: A Little Help from another person to put on and taking off regular upper body clothing?: None Help from another person to put on and taking off regular lower body clothing?: A Little 6 Click Score: 21   End of Session Equipment Utilized During Treatment: Rolling walker (2 wheels);Oxygen Nurse Communication: Mobility status  Activity Tolerance: Patient limited by fatigue Patient left: in chair;with call bell/phone within reach  OT Visit Diagnosis: Unsteadiness on feet (R26.81)                Time: 8366-2947 OT Time Calculation (min): 37 min Charges:  OT General Charges $OT Visit: 1 Visit OT Evaluation $OT Eval Moderate Complexity: 1 Mod OT Treatments $Self Care/Home Management : 8-22 mins  Glenford Peers 06/13/2022, 9:41 AM

## 2022-06-13 NOTE — Progress Notes (Signed)
Mobility Specialist Progress Note:   06/13/22 1506  Mobility  Activity Ambulated with assistance in room  Level of Assistance Standby assist, set-up cues, supervision of patient - no hands on  Assistive Device Front wheel walker  Distance Ambulated (ft) 50 ft  Activity Response Tolerated fair  Mobility Referral Yes  $Mobility charge 1 Mobility   Pre-mobility: 93% SpO2 5LO2 During mobility: 92% SpO2 6LO2 Post-mobility: 93% SpO2 5LO2  Pt received in chair and agreeable. Pt experienced SOB upon exertion, requiring pursed lip breathing and return to chair. Pt left in chair with all needs met and call bell in reach.   Niel Peretti Mobility Specialist-Acute Rehab Secure Chat only

## 2022-06-13 NOTE — Progress Notes (Signed)
PT Cancellation Note  Patient Details Name: SOPHIA CUBERO MRN: [id number] DOB: Dec 26, 1940   Cancelled Treatment:    Reason Eval/Treat Not Completed: Other (comment)  Just started lunch, and politely declining PT eval at this time;   Will follow up later today as time allows;  Otherwise, will follow up for PT tomorrow;   Thank you,  Roney Marion, [place] [facility] [place] 06/13/2022, 12:28 PM

## 2022-06-14 LAB — GLUCOSE, CAPILLARY
Glucose-Capillary: 165 mg/dL — ABNORMAL HIGH (ref 70–99)
Glucose-Capillary: 141 mg/dL — ABNORMAL HIGH (ref 70–99)
Glucose-Capillary: 146 mg/dL — ABNORMAL HIGH (ref 70–99)
Glucose-Capillary: 162 mg/dL — ABNORMAL HIGH (ref 70–99)

## 2022-06-14 LAB — BASIC METABOLIC PANEL
Anion gap: 6 (ref 5–15)
BUN: 42 mg/dL — ABNORMAL HIGH (ref 8–23)
CO2: 28 mmol/L (ref 22–32)
Calcium: 9.2 mg/dL (ref 8.9–10.3)
Chloride: 108 mmol/L (ref 98–111)
Creatinine, Ser: 1.06 mg/dL (ref 0.61–1.24)
GFR, Estimated: >60 mL/min (ref >60)
Glucose, Bld: 214 mg/dL — ABNORMAL HIGH (ref 70–99)
Potassium: 4.7 mmol/L (ref 3.5–5.1)
Sodium: 142 mmol/L (ref 135–145)

## 2022-06-14 LAB — CBC
HCT: 39.9 % (ref 39.0–52.0)
Hemoglobin: 12.6 g/dL — ABNORMAL LOW (ref 13.0–17.0)
MCH: 29.2 pg (ref 26.0–34.0)
MCHC: 31.6 g/dL (ref 30.0–36.0)
MCV: 92.4 fL (ref 80.0–100.0)
Platelets: 176 10*3/uL (ref 150–400)
RBC: 4.32 MIL/uL (ref 4.22–5.81)
RDW: 14.1 % (ref 11.5–15.5)
WBC: 14.3 10*3/uL — ABNORMAL HIGH (ref 4.0–10.5)
nRBC: 0 % (ref 0.0–0.2)

## 2022-06-14 LAB — PHOSPHORUS: Phosphorus: 3.6 mg/dL (ref 2.5–4.6)

## 2022-06-14 LAB — MAGNESIUM: Magnesium: 2.2 mg/dL (ref 1.7–2.4)

## 2022-06-14 NOTE — Progress Notes (Signed)
PROGRESS NOTE    Cory Kemp  UKG:[id number] DOB: 1940-12-29 DOA: 06/12/2022 PCP: Jani Gravel, MD   Brief Narrative:  This 81 yrs old male with PMH significant of remote ETOH dependence; BPH; COPD on 4L home O2; dementia; DM; HTN; and HLD presented in the ED with SOB.  He was seen in the ER on 11/2 for torticollis and had a chest xray for reported SOB at that time; CXR showed subtle increase in interstitial markings suggestive of scarring. He reports having chronic SOB for about 5-6 years. He usually gets up in the AM with O2 sats in the 80s and has to do breathing exercises to get them into the 90s before he begins to move around and start his day.  Overnight, he had acutely worse SOB, which happens periodically.  Patient is admitted for COPD exacerbation.  He was also found to have pneumonia,  started on empiric antibiotics.  Assessment & Plan:   Active Problems:   Diabetes mellitus ([place])   Essential hypertension   COPD with acute exacerbation (HCC)   Acute on chronic respiratory failure with hypoxia (HCC)   Dyslipidemia   Dementia without behavioral disturbance (HCC)   Acute on chronic hypoxic respiratory failure: COPD exacerbation: Patient presented with worsening shortness of breath and productive cough could be due to COPD flare. He has history of O2 dependent COPD and was hypoxic with SPO2 of 80% when EMS arrived on his usual 4 L. He was placed on BiPAP and was found to have rapid improvement and put back to his home oxygen requirement. Chest x-ray is not consistent with pneumonia;  CXR on 11/2 showed subtle increase in interstitial markings suggestive of scarring and this is consistent with today's film. Continue nebulizers: scheduled Duoneb and prn albuterol Continue Solu-Medrol 80 mg IV BID -> Prednisone 40 mg PO daily Continue IV Cefepime Continue Breztri, Singulair, Daliresp, Claritin, Flonase Coordinated care with [facility] team/PT/OT/Nutrition/RT consults Palliative care  consulted.   Diabetes mellitus: Last hemoglobin A1c 5.7 shows good control. Hold Glucophage. Continue regular insulin sliding scale .   Essential hypertension: Continue carvedilol 12.5 mg bid.   Hyperlipidemia: Continue simvastatin.  Dementia: Continue escitalopram 20 mg dialy Continue delirium precautions   DVT prophylaxis: Lovenox Code Status: DNR Family Communication: No family at bedside Disposition Plan: Status is: Inpatient Remains inpatient appropriate because: Admitted for COPD exacerbation requiring BiPAP and IV steroids.   Consultants:  None  Procedures: None.  Antimicrobials: Cefepime.  Subjective: Patient was seen and examined at bedside.  Overnight events noted.   Patient was lying comfortably in the bed with 5 L of supplemental oxygen. He reports doing better,  has participated in physical therapy.  Objective: Vitals:   06/13/22 2245 06/14/22 0614 06/14/22 0905 06/14/22 0915  BP: (!) 130/56 (!) 164/81  (!) 158/78  Pulse: 81 65 66 68  Resp: '18 20 20 20  '$ Temp: 98 F (36.7 C) 98 F (36.7 C)  98.4 F (36.9 C)  TempSrc:  Oral  Oral  SpO2: 97% 100% 100% 100%  Weight:      Height:        Intake/Output Summary (Last 24 hours) at 06/14/2022 1310 Last data filed at 06/14/2022 0900 Gross per 24 hour  Intake 595 ml  Output 350 ml  Net 245 ml   Filed Weights   06/12/22 0753  Weight: 94 kg    Examination:  General exam: Appears comfortable, not in any acute distress, deconditioned. Respiratory system: CTA bilaterally, respiratory effort normal, RR  15 Cardiovascular system: S1 & S2 heard, regular rate and rhythm, no murmur. Gastrointestinal system: Abdomen is soft, non tender, non distended, BS+ Central nervous system: Alert and oriented x 3. No focal neurological deficits. Extremities: No edema, no cyanosis, no clubbing Skin: No rashes, lesions or ulcers Psychiatry: Judgement and insight appear normal. Mood & affect appropriate.     Data  Reviewed: I have personally reviewed following labs and imaging studies  CBC: Recent Labs  Lab 06/09/22 1312 06/12/22 0630 06/12/22 0645 06/13/22 0439 06/14/22 0309  WBC 13.3* 10.4  --  8.0 14.3*  NEUTROABS 8.9* 5.9  --   --   --   HGB 14.2 13.5 12.9* 12.6* 12.6*  HCT 44.7 42.1 38.0* 39.0 39.9  MCV 93.5 92.5  --  91.1 92.4  PLT 167 160  --  160 710   Basic Metabolic Panel: Recent Labs  Lab 06/09/22 1312 06/12/22 0630 06/12/22 0645 06/13/22 0439 06/13/22 0904 06/14/22 0309  NA 143 138 140 140  --  142  K 4.5 3.9 3.6 5.2* 4.8 4.7  CL 102 101  --  106  --  108  CO2 26 26  --  28  --  28  GLUCOSE 94 139*  --  156*  --  214*  BUN 18 17  --  30*  --  42*  CREATININE 0.96 0.83  --  1.25*  --  1.06  CALCIUM 9.6 9.0  --  9.1  --  9.2  MG  --   --   --   --   --  2.2  PHOS  --   --   --   --   --  3.6   GFR: Estimated Creatinine Clearance: 61.8 mL/min (by C-G formula based on SCr of 1.06 mg/dL). Liver Function Tests: Recent Labs  Lab 06/09/22 1312 06/12/22 0630  AST 18 19  ALT 15 16  ALKPHOS 54 52  BILITOT 0.7 0.8  PROT 6.9 6.8  ALBUMIN 3.9 3.6   No results for input(s): "LIPASE", "AMYLASE" in the last 168 hours. No results for input(s): "AMMONIA" in the last 168 hours. Coagulation Profile: No results for input(s): "INR", "PROTIME" in the last 168 hours. Cardiac Enzymes: No results for input(s): "CKTOTAL", "CKMB", "CKMBINDEX", "TROPONINI" in the last 168 hours. BNP (last 3 results) No results for input(s): "PROBNP" in the last 8760 hours. HbA1C: No results for input(s): "HGBA1C" in the last 72 hours. CBG: Recent Labs  Lab 06/13/22 1119 06/13/22 1548 06/13/22 2244 06/14/22 0722 06/14/22 1141  GLUCAP 326* 83 226* 165* 141*   Lipid Profile: No results for input(s): "CHOL", "HDL", "LDLCALC", "TRIG", "CHOLHDL", "LDLDIRECT" in the last 72 hours. Thyroid Function Tests: No results for input(s): "TSH", "T4TOTAL", "FREET4", "T3FREE", "THYROIDAB" in the last 72  hours. Anemia Panel: No results for input(s): "VITAMINB12", "FOLATE", "FERRITIN", "TIBC", "IRON", "RETICCTPCT" in the last 72 hours. Sepsis Labs: No results for input(s): "PROCALCITON", "LATICACIDVEN" in the last 168 hours.  Recent Results (from the past 240 hour(s))  SARS Coronavirus 2 by RT PCR (hospital order, performed in [facility] hospital lab) *cepheid single result test* Anterior Nasal Swab     Status: None   Collection Time: 06/12/22  8:51 AM   Specimen: Anterior Nasal Swab  Result Value Ref Range Status   SARS Coronavirus 2 by RT PCR NEGATIVE NEGATIVE Final    Comment: (NOTE) SARS-CoV-2 target nucleic acids are NOT DETECTED.  The SARS-CoV-2 RNA is generally detectable in upper and lower respiratory specimens  during the acute phase of infection. The lowest concentration of SARS-CoV-2 viral copies this assay can detect is 250 copies / mL. A negative result does not preclude SARS-CoV-2 infection and should not be used as the sole basis for treatment or other patient management decisions.  A negative result may occur with improper specimen collection / handling, submission of specimen other than nasopharyngeal swab, presence of viral mutation(s) within the areas targeted by this assay, and inadequate number of viral copies (<250 copies / mL). A negative result must be combined with clinical observations, patient history, and epidemiological information.  Fact Sheet for Patients:   https://www.patel.info/  Fact Sheet for Healthcare Providers: https://hall.com/  This test is not yet approved or  cleared by the [place] FDA and has been authorized for detection and/or diagnosis of SARS-CoV-2 by FDA under an Emergency Use Authorization (EUA).  This EUA will remain in effect (meaning this test can be used) for the duration of the COVID-19 declaration under Section 564(b)(1) of the Act, 21 U.S.C. section 360bbb-3(b)(1), unless the  authorization is terminated or revoked sooner.  Performed at [facility], [place] [address] [place], [place] [address]     Radiology Studies: No results found.  Scheduled Meds:  carvedilol  12.5 mg Oral BID WC   docusate sodium  100 mg Oral BID   enoxaparin (LOVENOX) injection  40 mg Subcutaneous Q24H   escitalopram  20 mg Oral Daily   feeding supplement (GLUCERNA SHAKE)  237 mL Oral BID BM   fluticasone furoate-vilanterol  1 puff Inhalation Daily   insulin aspart  0-15 Units Subcutaneous TID WC   ipratropium-albuterol  3 mL Nebulization BID   loratadine  10 mg Oral Daily   montelukast  10 mg Oral Daily   multivitamin  1 tablet Oral QHS   predniSONE  40 mg Oral Q breakfast   roflumilast  250 mcg Oral Daily   simvastatin  40 mg Oral QPM   sodium chloride flush  3 mL Intravenous Q12H   tiZANidine  2 mg Oral QHS   umeclidinium bromide  1 puff Inhalation Daily   Continuous Infusions:  ceFEPime (MAXIPIME) IV 2 g (06/14/22 0840)     LOS: 2 days    Time spent: 35 mins    Winifred Bodiford, MD [facility]   If 7PM-7AM, please contact night-coverage

## 2022-06-14 NOTE — Progress Notes (Signed)
Mobility Specialist Progress Note   06/14/22 0916  Mobility  Activity Ambulated with assistance in room;Dangled on edge of bed  Level of Assistance Standby assist, set-up cues, supervision of patient - no hands on  Assistive Device None  Distance Ambulated (ft) 28 ft  Range of Motion/Exercises Active;All extremities  Activity Response Tolerated well   Pre Ambulation:  HR 68, SpO2 94% 4LO2 During Ambulation: HR 81,  SpO2 91% 4LO2 Post Ambulation: HR 72,  SpO2 93% 4LO2  Patient received in supine asleep and easily aroused.  Agreeable to participate in mobility. Ambulated short distance in room at supervision level with steady gait. Distance is limited secondary to fatigue and dyspnea. Can only tolerate short bouts of exertion before requiring extended seated rest/recovery break. Oxygen saturation maintained >90% on 4LO2 during ambulation, would drop to mid-high 80's during recovery period. Tolerated without incident, was left in supine with all needs met, call bell in reach.   Martinique Coolidge Gossard, [place], [place]  Office: [phone]

## 2022-06-14 NOTE — Progress Notes (Signed)
Inpatient Diabetes Program Recommendations  AACE/ADA: New Consensus Statement on Inpatient Glycemic Control (2015)  Target Ranges:  Prepandial:   less than 140 mg/dL      Peak postprandial:   less than 180 mg/dL (1-2 hours)      Critically ill patients:  140 - 180 mg/dL   Lab Results  Component Value Date   GLUCAP 165 (H) 06/14/2022   HGBA1C 5.7 (H) 01/07/2022    Review of Glycemic Control  Latest Reference Range & Units 06/13/22 08:00 06/13/22 11:19 06/13/22 15:48 06/13/22 22:44 06/14/22 07:22  Glucose-Capillary 70 - 99 mg/dL 197 (H) 326 (H) 83 226 (H) 165 (H)   Diabetes history: None Current orders for Inpatient glycemic control:  Novolog 0-15 units tid with meals  Prednisone 40 mg daily  Inpatient Diabetes Program Recommendations:    Note increase in post-prandial blood sugars.  If post-prandial blood sugars continue to be >180 mg/dL, consider adding:  -   Novolog 3 units tid with meals (hold if patient eats less than 50% or NPO).   Tama Headings RN, MSN, BC-ADM Inpatient Diabetes Coordinator Team Pager [phone] (8a-5p)

## 2022-06-14 NOTE — Evaluation (Signed)
Physical Therapy Evaluation Patient Details Name: Cory Kemp MRN: [id number] DOB: 10-08-1940 Today's Date: 06/14/2022  History of Present Illness  Patient is 81 y.o male seen in the ER on 11/2 for torticollis and had a chest xray for reported SOB. CXR showed subtle increase in interstitial markings suggestive of scarring. Pt returned home, overnight experienced worsening SOB and returned to ED on 11/5. Patient admitted for COPD exacerbation and found to have PNA. PMH significant for COPD, Arthritis, HTN, Osteopenia, DM, HLD.[name] Kitchen    Clinical Impression  Cory Kemp is 81 y.o. male admitted with above HPI and diagnosis. Patient is currently limited by functional impairments below (see PT problem list). Patient lives alone but has a roommate (person he rents a room to) and is independent with no AD for mobility and on 4L/min O2 at baseline. Currently pt is mobilizing at supervision/min guard level for transfers and gait with no device. He declined further ambulation out of room but walked short loop with SpO2 of 90% on 4L. Patient will benefit from continued skilled PT interventions to address impairments and progress independence with mobility, recommending HHPT. Acute PT will follow and progress as able.        Recommendations for follow up therapy are one component of a multi-disciplinary discharge planning process, led by the attending physician.  Recommendations may be updated based on patient status, additional functional criteria and insurance authorization.  Follow Up Recommendations Home health PT      Assistance Recommended at Discharge Intermittent Supervision/Assistance  Patient can return home with the following  A little help with walking and/or transfers;A little help with bathing/dressing/bathroom;Assistance with cooking/housework;Assist for transportation;Help with stairs or ramp for entrance    Equipment Recommendations None recommended by PT  Recommendations for Other  Services       Functional Status Assessment Patient has had a recent decline in their functional status and demonstrates the ability to make significant improvements in function in a reasonable and predictable amount of time.     Precautions / Restrictions Precautions Precautions: Fall;Other (comment) Precaution Comments: watch O2 sats Restrictions Weight Bearing Restrictions: No      Mobility  Bed Mobility Overal bed mobility: Modified Independent             General bed mobility comments: no assist.    Transfers Overall transfer level: Needs assistance Equipment used: None Transfers: Sit to/from Stand, Bed to chair/wheelchair/BSC Sit to Stand: Supervision   Step pivot transfers: Supervision       General transfer comment: supervision, use of hands required to power up and steady with turn to move bed>chair. pt complete sit<>stand from EOB and from recliner. supervision for safety.    Ambulation/Gait Ambulation/Gait assistance: Min guard Gait Distance (Feet): 20 Feet Assistive device: None Gait Pattern/deviations: Step-through pattern, Decreased stride length, Wide base of support Gait velocity: decr        Stairs            Wheelchair Mobility    Modified Rankin (Stroke Patients Only)       Balance Overall balance assessment: Needs assistance Sitting-balance support: Feet supported Sitting balance-Leahy Scale: Good     Standing balance support: Bilateral upper extremity supported, During functional activity Standing balance-Leahy Scale: Fair                               Pertinent Vitals/Pain Pain Assessment Pain Assessment: No/denies pain    Home Living Family/patient  expects to be discharged to:: Private residence Living Arrangements: Non-relatives/Friends Available Help at Discharge: Friend(s);Available PRN/intermittently Type of Home: Mobile home Home Access: Stairs to enter Entrance Stairs-Rails: Right Entrance  Stairs-Number of Steps: 5   Home Layout: One level Home Equipment: Rollator (4 wheels);Shower seat;Other (comment) Additional Comments: O2 all the time    Prior Function Prior Level of Function : Independent/Modified Independent             Mobility Comments: walks without a device; has a woodworking shop; has a rollator. enjoys traveling. drives and does his own food shopping. pt admits SpO2 drops into 80's when mobilizing on tanks in community. ADLs Comments: Indep but does not get in shower     Hand Dominance   Dominant Hand: Left    Extremity/Trunk Assessment   Upper Extremity Assessment Upper Extremity Assessment: Defer to OT evaluation    Lower Extremity Assessment Lower Extremity Assessment: Overall WFL for tasks assessed    Cervical / Trunk Assessment Cervical / Trunk Assessment: Normal  Communication   Communication: HOH  Cognition Arousal/Alertness: Awake/alert Behavior During Therapy: WFL for tasks assessed/performed Overall Cognitive Status: Within Functional Limits for tasks assessed                                          General Comments      Exercises     Assessment/Plan    PT Assessment Patient needs continued PT services  PT Problem List Decreased activity tolerance;Decreased balance;Decreased mobility;Decreased knowledge of use of DME;Decreased safety awareness;Decreased knowledge of precautions;Cardiopulmonary status limiting activity       PT Treatment Interventions DME instruction;Gait training;Stair training;Functional mobility training;Therapeutic activities;Therapeutic exercise;Balance training;Patient/family education    PT Goals (Current goals can be found in the Care Plan section)  Acute Rehab PT Goals Patient Stated Goal: get well enough to go home, pt reports he would love to travel again but is not optomistic he can do that. PT Goal Formulation: With patient Time For Goal Achievement: 06/28/22 Potential to  Achieve Goals: Good    Frequency Min 3X/week     Co-evaluation               AM-PAC PT "6 Clicks" Mobility  Outcome Measure Help needed turning from your back to your side while in a flat bed without using bedrails?: None Help needed moving from lying on your back to sitting on the side of a flat bed without using bedrails?: None Help needed moving to and from a bed to a chair (including a wheelchair)?: A Little Help needed standing up from a chair using your arms (e.g., wheelchair or bedside chair)?: A Little Help needed to walk in hospital room?: A Little Help needed climbing 3-5 steps with a railing? : A Lot 6 Click Score: 19    End of Session Equipment Utilized During Treatment: Gait belt;Oxygen Activity Tolerance: Patient tolerated treatment well Patient left: in chair;with call bell/phone within reach;with chair alarm set Nurse Communication: Mobility status (chair alarm set up, needs new batteries) PT Visit Diagnosis: Difficulty in walking, not elsewhere classified (R26.2);Unsteadiness on feet (R26.81);Other abnormalities of gait and mobility (R26.89)    Time: 2694-8546 PT Time Calculation (min) (ACUTE ONLY): 22 min   Charges:   PT Evaluation $PT Eval Low Complexity: 1 Low          Verner Mould, DPT Acute Rehabilitation Services Office [phone]  06/14/22 2:29 PM

## 2022-06-15 LAB — GLUCOSE, CAPILLARY
Glucose-Capillary: 77 mg/dL (ref 70–99)
Glucose-Capillary: 122 mg/dL — ABNORMAL HIGH (ref 70–99)
Glucose-Capillary: 163 mg/dL — ABNORMAL HIGH (ref 70–99)
Glucose-Capillary: 166 mg/dL — ABNORMAL HIGH (ref 70–99)

## 2022-06-15 NOTE — Progress Notes (Signed)
PROGRESS NOTE    Cory Kemp  TWS:[id number] DOB: 1941-08-03 DOA: 06/12/2022 PCP: Jani Gravel, MD   Brief Narrative:  This 81 yrs old male with PMH significant of remote ETOH dependence; BPH; COPD on 4L home O2; dementia; DM; HTN; and HLD presented in the ED with SOB.  He was seen in the ER on 11/2 for torticollis and had a chest xray for reported SOB at that time; CXR showed subtle increase in interstitial markings suggestive of scarring. He reports having chronic SOB for about 5-6 years. He usually gets up in the AM with O2 sats in the 80s and has to do breathing exercises to get them into the 90s before he begins to move around and start his day.  Overnight, he had acutely worse SOB, which happens periodically.  Patient is admitted for COPD exacerbation.  He was also found to have pneumonia,  started on empiric antibiotics.  Assessment & Plan:   Active Problems:   Diabetes mellitus ([place])   Essential hypertension   COPD with acute exacerbation (HCC)   Acute on chronic respiratory failure with hypoxia (HCC)   Dyslipidemia   Dementia without behavioral disturbance (HCC)   Acute on chronic hypoxic respiratory failure: COPD exacerbation: Patient presented with worsening shortness of breath and productive cough could be due to COPD flare. He has history of O2 dependent COPD and was hypoxic with SPO2 of 80% when EMS arrived on his usual 4 L. He was placed on BiPAP and was found to have rapid improvement and put back to his home oxygen requirement. Chest x-ray is not consistent with pneumonia;  CXR on 11/2 showed subtle increase in interstitial markings suggestive of scarring and this is consistent with today's film. Continue nebulizers: scheduled Duoneb and prn albuterol Continue Solu-Medrol 80 mg IV BID -> Prednisone 40 mg PO daily Continue IV Cefepime Continue Breztri, Singulair, Daliresp, Claritin, Flonase Coordinated care with [facility] team/PT/OT/Nutrition/RT consults Palliative care  consulted.   Diabetes mellitus: Last hemoglobin A1c 5.7 shows good control. Hold Glucophage. Continue insulin sliding scale . Blood sugars currently stable   Essential hypertension: Continue carvedilol 12.5 mg bid.   Hyperlipidemia: Continue simvastatin.  Dementia: Continue escitalopram 20 mg dialy Continue delirium precautions   DVT prophylaxis: Lovenox Code Status: DNR Family Communication: No family at bedside Disposition Plan: Status is: Inpatient Remains inpatient appropriate because: Admitted for COPD exacerbation requiring BiPAP and IV steroids.   Consultants:  None  Procedures: None.  Antimicrobials: Cefepime.  Subjective: He continues to have deep, productive cough  Objective: Vitals:   06/14/22 2115 06/15/22 0517 06/15/22 0829 06/15/22 1648  BP:  (!) 153/63  (!) 152/81  Pulse:  64  81  Resp:  18  16  Temp:  97.9 F (36.6 C)  97.7 F (36.5 C)  TempSrc:  Oral  Oral  SpO2: 96% 96% 97% 97%  Weight:      Height:        Intake/Output Summary (Last 24 hours) at 06/15/2022 1901 Last data filed at 06/15/2022 1700 Gross per 24 hour  Intake 1783.75 ml  Output 1600 ml  Net 183.75 ml   Filed Weights   06/12/22 0753  Weight: 94 kg    Examination:  General exam: Appears comfortable, not in any acute distress, deconditioned. Respiratory system: fair air movement bilaterally, no wheezing, bilateral rhonchi present Cardiovascular system: S1 & S2 heard, regular rate and rhythm, no murmur. Gastrointestinal system: Abdomen is soft, non tender, non distended, BS+ Central nervous system: Alert and  oriented x 3. No focal neurological deficits. Extremities: No edema, no cyanosis, no clubbing Skin: No rashes, lesions or ulcers Psychiatry: Judgement and insight appear normal. Mood & affect appropriate.     Data Reviewed: I have personally reviewed following labs and imaging studies  CBC: Recent Labs  Lab 06/09/22 1312 06/12/22 0630 06/12/22 0645  06/13/22 0439 06/14/22 0309  WBC 13.3* 10.4  --  8.0 14.3*  NEUTROABS 8.9* 5.9  --   --   --   HGB 14.2 13.5 12.9* 12.6* 12.6*  HCT 44.7 42.1 38.0* 39.0 39.9  MCV 93.5 92.5  --  91.1 92.4  PLT 167 160  --  160 443   Basic Metabolic Panel: Recent Labs  Lab 06/09/22 1312 06/12/22 0630 06/12/22 0645 06/13/22 0439 06/13/22 0904 06/14/22 0309  NA 143 138 140 140  --  142  K 4.5 3.9 3.6 5.2* 4.8 4.7  CL 102 101  --  106  --  108  CO2 26 26  --  28  --  28  GLUCOSE 94 139*  --  156*  --  214*  BUN 18 17  --  30*  --  42*  CREATININE 0.96 0.83  --  1.25*  --  1.06  CALCIUM 9.6 9.0  --  9.1  --  9.2  MG  --   --   --   --   --  2.2  PHOS  --   --   --   --   --  3.6   GFR: Estimated Creatinine Clearance: 61.8 mL/min (by C-G formula based on SCr of 1.06 mg/dL). Liver Function Tests: Recent Labs  Lab 06/09/22 1312 06/12/22 0630  AST 18 19  ALT 15 16  ALKPHOS 54 52  BILITOT 0.7 0.8  PROT 6.9 6.8  ALBUMIN 3.9 3.6   No results for input(s): "LIPASE", "AMYLASE" in the last 168 hours. No results for input(s): "AMMONIA" in the last 168 hours. Coagulation Profile: No results for input(s): "INR", "PROTIME" in the last 168 hours. Cardiac Enzymes: No results for input(s): "CKTOTAL", "CKMB", "CKMBINDEX", "TROPONINI" in the last 168 hours. BNP (last 3 results) No results for input(s): "PROBNP" in the last 8760 hours. HbA1C: No results for input(s): "HGBA1C" in the last 72 hours. CBG: Recent Labs  Lab 06/14/22 1616 06/14/22 2020 06/15/22 0725 06/15/22 1111 06/15/22 1647  GLUCAP 146* 162* 77 122* 163*   Lipid Profile: No results for input(s): "CHOL", "HDL", "LDLCALC", "TRIG", "CHOLHDL", "LDLDIRECT" in the last 72 hours. Thyroid Function Tests: No results for input(s): "TSH", "T4TOTAL", "FREET4", "T3FREE", "THYROIDAB" in the last 72 hours. Anemia Panel: No results for input(s): "VITAMINB12", "FOLATE", "FERRITIN", "TIBC", "IRON", "RETICCTPCT" in the last 72 hours. Sepsis  Labs: No results for input(s): "PROCALCITON", "LATICACIDVEN" in the last 168 hours.  Recent Results (from the past 240 hour(s))  SARS Coronavirus 2 by RT PCR (hospital order, performed in [facility] hospital lab) *cepheid single result test* Anterior Nasal Swab     Status: None   Collection Time: 06/12/22  8:51 AM   Specimen: Anterior Nasal Swab  Result Value Ref Range Status   SARS Coronavirus 2 by RT PCR NEGATIVE NEGATIVE Final    Comment: (NOTE) SARS-CoV-2 target nucleic acids are NOT DETECTED.  The SARS-CoV-2 RNA is generally detectable in upper and lower respiratory specimens during the acute phase of infection. The lowest concentration of SARS-CoV-2 viral copies this assay can detect is 250 copies / mL. A negative result does not preclude  SARS-CoV-2 infection and should not be used as the sole basis for treatment or other patient management decisions.  A negative result may occur with improper specimen collection / handling, submission of specimen other than nasopharyngeal swab, presence of viral mutation(s) within the areas targeted by this assay, and inadequate number of viral copies (<250 copies / mL). A negative result must be combined with clinical observations, patient history, and epidemiological information.  Fact Sheet for Patients:   https://www.patel.info/  Fact Sheet for Healthcare Providers: https://hall.com/  This test is not yet approved or  cleared by the [place] FDA and has been authorized for detection and/or diagnosis of SARS-CoV-2 by FDA under an Emergency Use Authorization (EUA).  This EUA will remain in effect (meaning this test can be used) for the duration of the COVID-19 declaration under Section 564(b)(1) of the Act, 21 U.S.C. section 360bbb-3(b)(1), unless the authorization is terminated or revoked sooner.  Performed at [facility], [place] [address], [place], [place] [address]      Radiology Studies: No results found.  Scheduled Meds:  carvedilol  12.5 mg Oral BID WC   docusate sodium  100 mg Oral BID   enoxaparin (LOVENOX) injection  40 mg Subcutaneous Q24H   escitalopram  20 mg Oral Daily   feeding supplement (GLUCERNA SHAKE)  237 mL Oral BID BM   fluticasone furoate-vilanterol  1 puff Inhalation Daily   insulin aspart  0-15 Units Subcutaneous TID WC   ipratropium-albuterol  3 mL Nebulization BID   loratadine  10 mg Oral Daily   montelukast  10 mg Oral Daily   multivitamin  1 tablet Oral QHS   predniSONE  40 mg Oral Q breakfast   roflumilast  250 mcg Oral Daily   simvastatin  40 mg Oral QPM   sodium chloride flush  3 mL Intravenous Q12H   tiZANidine  2 mg Oral QHS   umeclidinium bromide  1 puff Inhalation Daily   Continuous Infusions:  ceFEPime (MAXIPIME) IV 2 g (06/15/22 1827)     LOS: 3 days    Time spent: 35 mins    Kathie Dike, MD [facility]   If 7PM-7AM, please contact night-coverage

## 2022-06-15 NOTE — Progress Notes (Signed)
Mobility Specialist Progress Note   06/15/22 1626  Mobility  Activity Ambulated with assistance in room;Transferred from bed to chair  Level of Assistance Standby assist, set-up cues, supervision of patient - no hands on  Assistive Device None  Distance Ambulated (ft) 10 ft  Range of Motion/Exercises Active;All extremities  Activity Response Tolerated well   Patient received in supine, deferred extended ambulation but agreed to sit in recliner. Ambulated short distance in room to recliner with supervision. Tolerated without complaint or incident. Was left in recliner with all needs met, call bell in reach.   [place] Talyn Eddie, CMS, [place] [facility] Acute Rehabilitation Services  Office: [phone] t

## 2022-06-16 DIAGNOSIS — F0390 Unspecified dementia without behavioral disturbance: Secondary | ICD-10-CM

## 2022-06-16 LAB — GLUCOSE, CAPILLARY
Glucose-Capillary: 110 mg/dL — ABNORMAL HIGH (ref 70–99)
Glucose-Capillary: 114 mg/dL — ABNORMAL HIGH (ref 70–99)

## 2022-06-16 MED ORDER — PREDNISONE 10 MG PO TABS: ORAL_TABLET | ORAL | 0 refills | Status: AC

## 2022-06-16 MED ORDER — PREDNISONE 20 MG PO TABS
40.0000 mg | ORAL_TABLET | Freq: Once | ORAL | Status: AC
  Administered 2022-06-16: 40 mg via ORAL
  Filled 2022-06-16: qty 2

## 2022-06-16 MED ORDER — GUAIFENESIN ER 600 MG PO TB12: 600.0000 mg | ORAL_TABLET | Freq: Two times a day (BID) | ORAL | 0 refills | Status: AC | PRN

## 2022-06-16 NOTE — Progress Notes (Signed)
Occupational Therapy Treatment Patient Details Name: Cory Kemp MRN: [id number] DOB: 1941-08-04 Today's Date: 06/16/2022   History of present illness Patient is 81 y.o male seen in the ER on 11/2 for torticollis and had a chest xray for reported SOB. CXR showed subtle increase in interstitial markings suggestive of scarring. Pt returned home, overnight experienced worsening SOB and returned to ED on 11/5. Patient admitted for COPD exacerbation and found to have PNA. PMH significant for COPD, Arthritis, HTN, Osteopenia, DM, HLD.   OT comments  Pt making good progress toward all goals. Feel pt is very close to baseline with all adls. Pt fatigues quickly but is safe doing adls on his feet with several rest breaks in sitting to keep O2 sats up. Pt monitors his own O2 sats and is on 4 L of O2 at home.  Energy conservation education completed. Will see one more visit to achieve mod I level of independence with all basic adls.    Recommendations for follow up therapy are one component of a multi-disciplinary discharge planning process, led by the attending physician.  Recommendations may be updated based on patient status, additional functional criteria and insurance authorization.    Follow Up Recommendations  No OT follow up    Assistance Recommended at Discharge Intermittent Supervision/Assistance  Patient can return home with the following  Help with stairs or ramp for entrance;Assistance with cooking/housework   Equipment Recommendations       Recommendations for Other Services      Precautions / Restrictions Precautions Precautions: Fall;Other (comment) Precaution Comments: watch O2 sats Restrictions Weight Bearing Restrictions: No       Mobility Bed Mobility Overal bed mobility: Modified Independent             General bed mobility comments: no assist.    Transfers Overall transfer level: Needs assistance Equipment used: None Transfers: Sit to/from Stand, Bed to  chair/wheelchair/BSC Sit to Stand: Supervision     Step pivot transfers: Supervision     General transfer comment: supervision for safety and O2 tank     Balance Overall balance assessment: Needs assistance Sitting-balance support: Feet supported Sitting balance-Leahy Scale: Good     Standing balance support: Bilateral upper extremity supported, During functional activity Standing balance-Leahy Scale: Fair Standing balance comment: Pt naviating room without assitive device. No LOB. Speeds up when he gets SOB. Talked to him about taking his time to avoid falls.                           ADL either performed or assessed with clinical judgement   ADL Overall ADL's : Needs assistance/impaired Eating/Feeding: Independent;Sitting   Grooming: Wash/dry hands;Wash/dry face;Oral care;Supervision/safety;Standing Grooming Details (indicate cue type and reason): Pt has a chair at home to groom but stood at sink for appx 2 minutes with distant supervision.         Upper Body Dressing : Supervision/safety;Sitting   Lower Body Dressing: Supervision/safety;Sit to/from stand   Toilet Transfer: Supervision/safety;Ambulation;BSC/3in1;Comfort height toilet;Grab bars Toilet Transfer Details (indicate cue type and reason): Pt walked to bathroom without an assistive device and toileted all with distant supervision. Toileting- Clothing Manipulation and Hygiene: Supervision/safety;Sit to/from stand Toileting - Clothing Manipulation Details (indicate cue type and reason): pt required no assist cleaning self.     Functional mobility during ADLs: Supervision/safety General ADL Comments: Pt is very close to baseline and now at supervision level for all adls. Pt provided with energy conservation handouts.  Extremity/Trunk Assessment Upper Extremity Assessment Upper Extremity Assessment: Overall WFL for tasks assessed   Lower Extremity Assessment Lower Extremity Assessment: Defer to PT  evaluation        Vision   Vision Assessment?: No apparent visual deficits   Perception     Praxis      Cognition Arousal/Alertness: Awake/alert Behavior During Therapy: WFL for tasks assessed/performed Overall Cognitive Status: Within Functional Limits for tasks assessed                                          Exercises      Shoulder Instructions       General Comments Pt close to or at baseline functional level for OT. Will see one more session to achieve mod I goals    Pertinent Vitals/ Pain       Pain Assessment Pain Assessment: No/denies pain  Home Living                                          Prior Functioning/Environment              Frequency  Min 2X/week        Progress Toward Goals  OT Goals(current goals can now be found in the care plan section)  Progress towards OT goals: Progressing toward goals  Acute Rehab OT Goals Patient Stated Goal: to go home OT Goal Formulation: With patient Time For Goal Achievement: 06/27/22 Potential to Achieve Goals: Good ADL Goals Pt Will Perform Lower Body Bathing: with modified independence;sit to/from stand Pt Will Perform Lower Body Dressing: with modified independence;sit to/from stand Additional ADL Goal #1: Pt will walk to bathroom with rollator/walker and complete all toileting with mod I. Additional ADL Goal #2: Pt will state 3 new things he could do at home to conserve energy at home during adls without cues.  Plan Discharge plan remains appropriate    Co-evaluation                 AM-PAC OT "6 Clicks" Daily Activity     Outcome Measure   Help from another person eating meals?: None Help from another person taking care of personal grooming?: None Help from another person toileting, which includes using toliet, bedpan, or urinal?: None Help from another person bathing (including washing, rinsing, drying)?: A Little Help from another person to put  on and taking off regular upper body clothing?: None Help from another person to put on and taking off regular lower body clothing?: None 6 Click Score: 23    End of Session Equipment Utilized During Treatment: Oxygen  OT Visit Diagnosis: Unsteadiness on feet (R26.81)   Activity Tolerance Patient limited by fatigue   Patient Left in chair;with call bell/phone within reach   Nurse Communication Mobility status        Time: 3976-7341 OT Time Calculation (min): 25 min  Charges: OT General Charges $OT Visit: 1 Visit OT Treatments $Self Care/Home Management : 23-37 mins   Glenford Peers 06/16/2022, 11:53 AM

## 2022-06-16 NOTE — Care Management Important Message (Signed)
Important Message  Patient Details  Name: Cory Kemp MRN: [id number] Date of Birth: [birth year]   Medicare Important Message Given:  Yes     Orbie Pyo 06/16/2022, 2:55 PM

## 2022-06-16 NOTE — Progress Notes (Signed)
Physical Therapy Treatment Patient Details Name: Cory Kemp MRN: [id number] DOB: 1940-12-20 Today's Date: 06/16/2022   History of Present Illness Patient is 81 y.o male seen in the ER on 11/2 for torticollis and had a chest xray for reported SOB. CXR showed subtle increase in interstitial markings suggestive of scarring. Pt returned home, overnight experienced worsening SOB and returned to ED on 11/5. Patient admitted for COPD exacerbation and found to have PNA. PMH significant for COPD, Arthritis, HTN, Osteopenia, DM, HLD.    PT Comments    Seems to be at baseline for mobility with no AD but still requires supervision at times for line management as patient is not used to having oxygen tank. Moves impulsively when SOB to return to seated position. SpO2 88% on 4L O2 after mobility. D/c plan remains appropriate.     Recommendations for follow up therapy are one component of a multi-disciplinary discharge planning process, led by the attending physician.  Recommendations may be updated based on patient status, additional functional criteria and insurance authorization.  Follow Up Recommendations  Home health PT     Assistance Recommended at Discharge Intermittent Supervision/Assistance  Patient can return home with the following A little help with walking and/or transfers;A little help with bathing/dressing/bathroom;Assistance with cooking/housework;Assist for transportation;Help with stairs or ramp for entrance   Equipment Recommendations  None recommended by PT    Recommendations for Other Services       Precautions / Restrictions Precautions Precautions: Fall;Other (comment) Precaution Comments: watch O2 sats Restrictions Weight Bearing Restrictions: No     Mobility  Bed Mobility Overal bed mobility: Modified Independent                  Transfers Overall transfer level: Needs assistance Equipment used: None Transfers: Sit to/from Stand, Bed to  chair/wheelchair/BSC Sit to Stand: Supervision           General transfer comment: supervision for safety    Ambulation/Gait Ambulation/Gait assistance: Supervision Gait Distance (Feet): 10 Feet (+10') Assistive device: None Gait Pattern/deviations: Step-through pattern, Decreased stride length, Wide base of support Gait velocity: decr     General Gait Details: supervision for safety. Impulsive when SOB   Stairs             Wheelchair Mobility    Modified Rankin (Stroke Patients Only)       Balance Overall balance assessment: Mild deficits observed, not formally tested                                          Cognition Arousal/Alertness: Awake/alert Behavior During Therapy: WFL for tasks assessed/performed Overall Cognitive Status: Within Functional Limits for tasks assessed                                          Exercises      General Comments        Pertinent Vitals/Pain Pain Assessment Pain Assessment: No/denies pain    Home Living                          Prior Function            PT Goals (current goals can now be found in the care plan section) Acute Rehab PT Goals  PT Goal Formulation: With patient Time For Goal Achievement: 06/28/22 Potential to Achieve Goals: Good Progress towards PT goals: Progressing toward goals    Frequency    Min 3X/week      PT Plan Current plan remains appropriate    Co-evaluation              AM-PAC PT "6 Clicks" Mobility   Outcome Measure  Help needed turning from your back to your side while in a flat bed without using bedrails?: None Help needed moving from lying on your back to sitting on the side of a flat bed without using bedrails?: None Help needed moving to and from a bed to a chair (including a wheelchair)?: A Little Help needed standing up from a chair using your arms (e.g., wheelchair or bedside chair)?: A Little Help needed to  walk in hospital room?: A Little Help needed climbing 3-5 steps with a railing? : A Lot 6 Click Score: 19    End of Session Equipment Utilized During Treatment: Oxygen Activity Tolerance: Patient tolerated treatment well Patient left: in bed;with call bell/phone within reach Nurse Communication: Mobility status PT Visit Diagnosis: Difficulty in walking, not elsewhere classified (R26.2);Unsteadiness on feet (R26.81);Other abnormalities of gait and mobility (R26.89)     Time: 1413-1500 PT Time Calculation (min) (ACUTE ONLY): 47 min  Charges:  $Therapeutic Activity: 38-52 mins                     Cory Kemp PT, DPT Acute Rehabilitation Services Office [phone]    [name] 06/16/2022, 5:10 PM

## 2022-06-16 NOTE — TOC Transition Note (Signed)
Transition of Care [facility]) - CM/SW Discharge Note   Patient Details  Name: COTEY RAKES MRN: [id number] Date of Birth: [birth year]  [facility]) CM/SW Contact:  Tom-Johnson, Renea Ee, RN Phone Number: 06/16/2022, 2:40 PM   Clinical Narrative:     Patient is scheduled for discharge today. Home with Hospice services from [facility], info on AVS. [facility] to transport at discharge per daughter [name]'s request. No further TOC needs noted.   Final next level of care: Home w Hospice Care Barriers to Discharge: Barriers Resolved   Patient Goals and CMS Choice Patient states their goals for this hospitalization and ongoing recovery are:: To return home CMS Medicare.gov Compare Post Acute Care list provided to:: Patient Choice offered to / list presented to : [facility], [facility])  Discharge Placement                Patient to be transferred to facility by: [facility]      Discharge Plan and Services                DME Arranged: N/A DME Agency: NA       HH Arranged: NA HH Agency: Other - See comment ([facility].)        Social Determinants of Health (SDOH) Interventions     Readmission Risk Interventions     No data to display

## 2022-06-16 NOTE — Discharge Summary (Signed)
Physician Discharge Summary  Cory Kemp:[id number] DOB: 01-09-1941 DOA: 06/12/2022  PCP: Jani Gravel, MD  Admit date: 06/12/2022 Discharge date: 06/16/2022  Admitted From: Home Disposition: Home with hospice  Recommendations for Outpatient Follow-up:  Patient will be discharged home with hospice services  Home Health: Home health PT, OT Equipment/Devices:  Discharge Condition: Stable CODE STATUS: DNR Diet recommendation: Heart healthy  Brief/Interim Summary: This 81 yrs old male with PMH significant of remote ETOH dependence; BPH; COPD on 4L home O2; dementia; DM; HTN; and HLD presented in the ED with SOB.  He was seen in the ER on 11/2 for torticollis and had a chest xray for reported SOB at that time; CXR showed subtle increase in interstitial markings suggestive of scarring. He reports having chronic SOB for about 5-6 years. He usually gets up in the AM with O2 sats in the 80s and has to do breathing exercises to get them into the 90s before he begins to move around and start his day.  Overnight, he had acutely worse SOB, which happens periodically.  Patient is admitted for COPD exacerbation.  He was also found to have pneumonia,  started on empiric antibiotics.  Discharge Diagnoses:  Active Problems:   Diabetes mellitus ([place])   Essential hypertension   COPD with acute exacerbation (HCC)   Acute on chronic respiratory failure with hypoxia (HCC)   Dyslipidemia   Dementia without behavioral disturbance (HCC)  Acute on chronic hypoxic respiratory failure: COPD exacerbation: Patient presented with worsening shortness of breath and productive cough could be due to COPD flare. He has history of O2 dependent COPD and is chronically on 4 L of oxygen.  He was hypoxic with SPO2 of 80% when EMS arrived on his usual 4 L. He was placed on BiPAP and was found to have rapid improvement and put back to his home oxygen requirement. Chest x-ray is not consistent with pneumonia;  CXR on 11/2  showed subtle increase in interstitial markings suggestive of scarring and this is consistent with today's film. He was treated with scheduled bronchodilators Received a course of Solu-Medrol and transition to prednisone taper He completed 5 days of cefepime Continue Breztri, Singulair, Daliresp, Claritin, Flonase Coordinated care with [facility] team/PT/OT/Nutrition/RT consults He was elevated by palliative care and agreed to DNR status.  Furthermore, he agreed to having hospice services follow him at home.   Diabetes mellitus: Last hemoglobin A1c 5.7 shows good control. Resume Glucophage on discharge Continue insulin sliding scale . Blood sugars currently stable   Essential hypertension: Continue carvedilol 12.5 mg bid.   Hyperlipidemia: Continue simvastatin.   Dementia: Continue escitalopram 20 mg dialy Continue delirium precautions  Discharge Instructions  Discharge Instructions     Diet - low sodium heart healthy   Complete by: As directed    Face-to-face encounter (required for Medicare/Medicaid patients)   Complete by: As directed    I Kathie Dike certify that this patient is under my care and that I, or a nurse practitioner or physician's assistant working with me, had a face-to-face encounter that meets the physician face-to-face encounter requirements with this patient on 06/16/2022. The encounter with the patient was in whole, or in part for the following medical condition(s) which is the primary reason for home health care (List medical condition): copd   The encounter with the patient was in whole, or in part, for the following medical condition, which is the primary reason for home health care: copd   I certify that, based on my  findings, the following services are medically necessary home health services: Physical therapy   Reason for Medically Necessary Home Health Services: Therapy- Therapeutic Exercises to Increase Strength and Endurance   My clinical findings support  the need for the above services: Shortness of breath with activity   Further, I certify that my clinical findings support that this patient is homebound due to: Shortness of Breath with activity   Home Health   Complete by: As directed    To provide the following care/treatments: PT   Increase activity slowly   Complete by: As directed       Allergies as of 06/16/2022       Reactions   Adhesive [tape] Rash   No "PLASTIC" tape!!! Patient broke out in blisters!!   Tegaderm Alginate Ag Rope Rash        Medication List     STOP taking these medications    azithromycin 500 MG tablet Commonly known as: ZITHROMAX       TAKE these medications    acetaminophen 500 MG tablet Commonly known as: TYLENOL Take 1,000 mg by mouth every 6 (six) hours as needed for mild pain.   albuterol (2.5 MG/3ML) 0.083% nebulizer solution Commonly known as: PROVENTIL Take 3 mLs (2.5 mg total) by nebulization every 6 (six) hours as needed for wheezing or shortness of breath. What changed: Another medication with the same name was changed. Make sure you understand how and when to take each.   albuterol 108 (90 Base) MCG/ACT inhaler Commonly known as: VENTOLIN HFA INHALE 2 PUFFS BY MOUTH EVERY 6 HOURS AS NEEDED FOR WHEEZING OR SHORTNESS OF BREATH What changed: See the new instructions.   ascorbic acid 500 MG tablet Commonly known as: VITAMIN C Take 1 tablet (500 mg total) by mouth daily.   Breztri Aerosphere 160-9-4.8 MCG/ACT Aero Generic drug: Budeson-Glycopyrrol-Formoterol Inhale 2 puffs into the lungs in the morning and at bedtime.   carvedilol 12.5 MG tablet Commonly known as: COREG Take 1 tablet (12.5 mg total) by mouth 2 (two) times daily with a meal.   cholecalciferol 25 MCG (1000 UNIT) tablet Commonly known as: VITAMIN D3 Take 1,000 Units by mouth daily.   Daliresp 250 MCG Tabs Generic drug: Roflumilast TAKE 1 TABLET BY MOUTH EVERY MORNING What changed: how much to take    diclofenac Sodium 1 % Gel Commonly known as: Voltaren Apply to affected area twice a day   escitalopram 20 MG tablet Commonly known as: LEXAPRO Take 20 mg by mouth daily.   fluticasone 50 MCG/ACT nasal spray Commonly known as: Flonase Place 2 sprays into both nostrils daily. Reduce to 1 spray each nostril daily after stopping Afrin   guaiFENesin 600 MG 12 hr tablet Commonly known as: MUCINEX Take 1 tablet (600 mg total) by mouth 2 (two) times daily as needed for cough or to loosen phlegm.   Icy Hot Advanced Pain Relief 16-11 % Crea Generic drug: Menthol-Camphor Apply 1 Application topically 2 (two) times daily as needed (pain).   loratadine 10 MG tablet Commonly known as: CLARITIN Take 1 tablet (10 mg total) by mouth daily for 15 days.   montelukast 10 MG tablet Commonly known as: SINGULAIR Take 10 mg by mouth daily.   OXYGEN Inhale 4 L into the lungs continuous.   oxymetazoline 0.05 % nasal spray Commonly known as: AFRIN Place 1 spray into both nostrils 2 (two) times daily.   predniSONE 10 MG tablet Commonly known as: DELTASONE Take '40mg'$  po daily for 2  days then '30mg'$  daily for 2 days then '20mg'$  daily for 2 days then '10mg'$  daily for 2 days then stop   simvastatin 40 MG tablet Commonly known as: ZOCOR Take 40 mg by mouth every evening.   tiZANidine 2 MG tablet Commonly known as: ZANAFLEX Take 1 tablet (2 mg total) by mouth at bedtime.   vitamin B-12 500 MCG tablet Commonly known as: CYANOCOBALAMIN Take 1,000 mcg by mouth daily.        Allergies  Allergen Reactions   Adhesive [Tape] Rash    No "PLASTIC" tape!!! Patient broke out in blisters!!   Tegaderm Alginate Ag Rope Rash    Consultations: Palliative care   Procedures/Studies: DG Chest Port 1 View  Result Date: 06/12/2022 CLINICAL DATA:  Shortness of breath.  Respiratory distress. EXAM: PORTABLE CHEST 1 VIEW COMPARISON:  06/09/2022 FINDINGS: Lungs are hyperexpanded. Similar interstitial prominence  and subtle ground-glass opacity right mid lung and left base. Blunting of the right costophrenic angle may be related to hyperinflation or tiny effusion. Cardiopericardial silhouette is at upper limits of normal for size. Telemetry leads overlie the chest. IMPRESSION: No substantial interval change in exam. Electronically Signed   By: Misty Stanley M.D.   On: 06/12/2022 06:51   DG Chest 2 View  Result Date: 06/09/2022 CLINICAL DATA:  COPD, shortness of breath EXAM: CHEST - 2 VIEW COMPARISON:  01/06/2022 FINDINGS: Cardiac size is within normal limits. Central pulmonary vessels are prominent. There is slight prominence of interstitial markings in right parahilar region and both lower lung fields with no significant interval change. No new focal infiltrates are seen. Increase in AP diameter of chest suggests COPD. There is no pleural effusion or pneumothorax. IMPRESSION: COPD. Subtle increase in interstitial markings in right parahilar region and both lower lung fields may suggest scarring. There are no new infiltrates or signs of pulmonary edema. Electronically Signed   By: Elmer Picker M.D.   On: 06/09/2022 14:53   CT Cervical Spine Wo Contrast  Result Date: 06/09/2022 CLINICAL DATA:  Neck pain, acute.  COPD on 5 L nasal cannula. EXAM: CT CERVICAL SPINE WITHOUT CONTRAST TECHNIQUE: Multidetector CT imaging of the cervical spine was performed without intravenous contrast. Multiplanar CT image reconstructions were also generated. RADIATION DOSE REDUCTION: This exam was performed according to the departmental dose-optimization program which includes automated exposure control, adjustment of the mA and/or kV according to patient size and/or use of iterative reconstruction technique. COMPARISON:  None Available. FINDINGS: The technologist reports this patient breathing heavily at baseline secondary to COPD and a 5L nasal cannula oxygen requirement. This causes mild motion artifact that limits evaluation of  the fine bony detail especially at the C2 through C6 levels. Greater motion artifact is seen involving the mandible. Alignment: The atlantodens interval is intact with moderate to severe joint space narrowing, subchondral sclerosis, peripheral osteophytosis/ossicle osteoarthritis. No sagittal spondylolisthesis. Minimal anterior wedging of the C6 vertebral body with associated mild mild kyphotic angulation centered at C6. The report of prior cervical spine radiographs dated 06/10/2019 (images unavailable) describes kyphotic angulation of the lower cervical spine. Skull base and vertebrae: Severe anterior and mild posterior C6-7 and mild anterior C5-6 and C7-T1 disc space narrowing. Anterior C5-6 through C7-T1 bridging osteophytes, greatest at C6-7. No acute fracture is seen. Soft tissues and spinal canal: No prevertebral fluid or swelling. No visible canal hematoma. Disc levels: Multilevel degenerative disc and joint changes including disc space narrowing, uncovertebral hypertrophy, and facet joint hypertrophy contributing to moderate left and very mild  right C2-3, mild right C3-4, mild-to-moderate right C5-6, borderline mild bilateral C6-7 neuroforaminal stenosis. Mild C4-5 and C5-6 and mild-to-moderate C6-7 central canal stenosis. Upper chest: There is high-grade motion artifact within the bilateral lung apices. There is suggestion of centrilobular cystic emphysematous changes, as seen on prior CTA chest 07/11/2021. Mild right-greater-than-left apical scarring is similar to prior. There appears to be interval increase in size of an anterior right apical bulla, which now increasingly extends superiorly, measuring up to approximately 3.7 cm in transverse dimension and 2.5 cm in AP dimension and herniating through the confines of the superior right thoracic cage by approximately 19 mm (coronal series 9, image 43 and axial series 5, image 85), previously 5 mm on 07/11/2021. Other: No cervical chain lymphadenopathy.  Moderate to high-grade bilateral carotid bulb atherosclerotic calcification. IMPRESSION: 1. No acute fracture. 2. Multilevel degenerative disc and joint changes as above. 3. Moderate left C2-3 and mild-to-moderate right C5-6 neuroforaminal stenosis. Mild C4-5 and C5-6 and mild-to-moderate C6-7 central canal stenosis. Electronically Signed   By: Yvonne Kendall M.D.   On: 06/09/2022 14:07      Subjective: Reports that his breathing is approaching baseline  Discharge Exam: Vitals:   06/15/22 2028 06/16/22 0547 06/16/22 0744 06/16/22 0900  BP:  (!) 158/77  (!) 148/57  Pulse:  80  74  Resp:  18  18  Temp:  98 F (36.7 C)  98.1 F (36.7 C)  TempSrc:  Oral  Oral  SpO2: 95% 98% 95%   Weight:      Height:        General: Pt is alert, awake, not in acute distress Cardiovascular: RRR, S1/S2 +, no rubs, no gallops Respiratory: CTA bilaterally, no wheezing, no rhonchi Abdominal: Soft, NT, ND, bowel sounds + Extremities: no edema, no cyanosis    The results of significant diagnostics from this hospitalization (including imaging, microbiology, ancillary and laboratory) are listed below for reference.     Microbiology: Recent Results (from the past 240 hour(s))  SARS Coronavirus 2 by RT PCR (hospital order, performed in [facility] hospital lab) *cepheid single result test* Anterior Nasal Swab     Status: None   Collection Time: 06/12/22  8:51 AM   Specimen: Anterior Nasal Swab  Result Value Ref Range Status   SARS Coronavirus 2 by RT PCR NEGATIVE NEGATIVE Final    Comment: (NOTE) SARS-CoV-2 target nucleic acids are NOT DETECTED.  The SARS-CoV-2 RNA is generally detectable in upper and lower respiratory specimens during the acute phase of infection. The lowest concentration of SARS-CoV-2 viral copies this assay can detect is 250 copies / mL. A negative result does not preclude SARS-CoV-2 infection and should not be used as the sole basis for treatment or other patient management  decisions.  A negative result may occur with improper specimen collection / handling, submission of specimen other than nasopharyngeal swab, presence of viral mutation(s) within the areas targeted by this assay, and inadequate number of viral copies (<250 copies / mL). A negative result must be combined with clinical observations, patient history, and epidemiological information.  Fact Sheet for Patients:   https://www.patel.info/  Fact Sheet for Healthcare Providers: https://hall.com/  This test is not yet approved or  cleared by the [place] FDA and has been authorized for detection and/or diagnosis of SARS-CoV-2 by FDA under an Emergency Use Authorization (EUA).  This EUA will remain in effect (meaning this test can be used) for the duration of the COVID-19 declaration under Section 564(b)(1) of the  Act, 21 U.S.C. section 360bbb-3(b)(1), unless the authorization is terminated or revoked sooner.  Performed at [facility], [place] [address] [place], [place] [address]      Labs: BNP (last 3 results) Recent Labs    07/16/21 0225 01/06/22 2023 06/12/22 0630  BNP 205.2* 112.2* 322.0*   Basic Metabolic Panel: Recent Labs  Lab 06/12/22 0630 06/12/22 0645 06/13/22 0439 06/13/22 0904 06/14/22 0309  NA 138 140 140  --  142  K 3.9 3.6 5.2* 4.8 4.7  CL 101  --  106  --  108  CO2 26  --  28  --  28  GLUCOSE 139*  --  156*  --  214*  BUN 17  --  30*  --  42*  CREATININE 0.83  --  1.25*  --  1.06  CALCIUM 9.0  --  9.1  --  9.2  MG  --   --   --   --  2.2  PHOS  --   --   --   --  3.6   Liver Function Tests: Recent Labs  Lab 06/12/22 0630  AST 19  ALT 16  ALKPHOS 52  BILITOT 0.8  PROT 6.8  ALBUMIN 3.6   No results for input(s): "LIPASE", "AMYLASE" in the last 168 hours. No results for input(s): "AMMONIA" in the last 168 hours. CBC: Recent Labs  Lab 06/12/22 0630 06/12/22 0645 06/13/22 0439 06/14/22 0309   WBC 10.4  --  8.0 14.3*  NEUTROABS 5.9  --   --   --   HGB 13.5 12.9* 12.6* 12.6*  HCT 42.1 38.0* 39.0 39.9  MCV 92.5  --  91.1 92.4  PLT 160  --  160 176   Cardiac Enzymes: No results for input(s): "CKTOTAL", "CKMB", "CKMBINDEX", "TROPONINI" in the last 168 hours. BNP: Invalid input(s): "POCBNP" CBG: Recent Labs  Lab 06/15/22 1111 06/15/22 1647 06/15/22 1947 06/16/22 0746 06/16/22 1115  GLUCAP 122* 163* 166* 110* 114*   D-Dimer No results for input(s): "DDIMER" in the last 72 hours. Hgb A1c No results for input(s): "HGBA1C" in the last 72 hours. Lipid Profile No results for input(s): "CHOL", "HDL", "LDLCALC", "TRIG", "CHOLHDL", "LDLDIRECT" in the last 72 hours. Thyroid function studies No results for input(s): "TSH", "T4TOTAL", "T3FREE", "THYROIDAB" in the last 72 hours.  Invalid input(s): "FREET3" Anemia work up No results for input(s): "VITAMINB12", "FOLATE", "FERRITIN", "TIBC", "IRON", "RETICCTPCT" in the last 72 hours. Urinalysis    Component Value Date/Time   COLORURINE YELLOW 04/19/2020 1926   APPEARANCEUR CLEAR 04/19/2020 1926   LABSPEC 1.016 04/19/2020 1926   PHURINE 5.0 04/19/2020 1926   GLUCOSEU NEGATIVE 04/19/2020 1926   HGBUR LARGE (A) 04/19/2020 1926   BILIRUBINUR NEGATIVE 04/19/2020 1926   KETONESUR 5 (A) 04/19/2020 1926   PROTEINUR 100 (A) 04/19/2020 1926   UROBILINOGEN 0.2 07/22/2013 0725   NITRITE NEGATIVE 04/19/2020 1926   LEUKOCYTESUR NEGATIVE 04/19/2020 1926   Sepsis Labs Recent Labs  Lab 06/12/22 0630 06/13/22 0439 06/14/22 0309  WBC 10.4 8.0 14.3*   Microbiology Recent Results (from the past 240 hour(s))  SARS Coronavirus 2 by RT PCR (hospital order, performed in [facility] hospital lab) *cepheid single result test* Anterior Nasal Swab     Status: None   Collection Time: 06/12/22  8:51 AM   Specimen: Anterior Nasal Swab  Result Value Ref Range Status   SARS Coronavirus 2 by RT PCR NEGATIVE NEGATIVE Final    Comment:  (NOTE) SARS-CoV-2 target nucleic  acids are NOT DETECTED.  The SARS-CoV-2 RNA is generally detectable in upper and lower respiratory specimens during the acute phase of infection. The lowest concentration of SARS-CoV-2 viral copies this assay can detect is 250 copies / mL. A negative result does not preclude SARS-CoV-2 infection and should not be used as the sole basis for treatment or other patient management decisions.  A negative result may occur with improper specimen collection / handling, submission of specimen other than nasopharyngeal swab, presence of viral mutation(s) within the areas targeted by this assay, and inadequate number of viral copies (<250 copies / mL). A negative result must be combined with clinical observations, patient history, and epidemiological information.  Fact Sheet for Patients:   https://www.patel.info/  Fact Sheet for Healthcare Providers: https://hall.com/  This test is not yet approved or  cleared by the [place] FDA and has been authorized for detection and/or diagnosis of SARS-CoV-2 by FDA under an Emergency Use Authorization (EUA).  This EUA will remain in effect (meaning this test can be used) for the duration of the COVID-19 declaration under Section 564(b)(1) of the Act, 21 U.S.C. section 360bbb-3(b)(1), unless the authorization is terminated or revoked sooner.  Performed at [facility], [place] [address] [place], [place] [address]      Time coordinating discharge: 27mns  SIGNED:   JKathie Dike MD  [facility] 06/16/2022, 7:42 PM   If 7PM-7AM, please contact night-coverage [email or web address]

## 2022-06-22 ENCOUNTER — Telehealth: Payer: Self-pay

## 2022-06-22 ENCOUNTER — Other Ambulatory Visit (HOSPITAL_COMMUNITY): Payer: Self-pay

## 2022-06-22 NOTE — Telephone Encounter (Signed)
        Patient  visited [facility] on 06/09/2022  for Strain of muscle, fascia and tendon at neck level, initial encounter.   Telephone encounter attempt :  1st  A HIPAA compliant voice message was left requesting a return call.  Instructed patient to call back at [phone].   [place] Resource Care Guide   [email or web address]  ?? [phone]   Website: [email or web address]  [email or web address]

## 2022-06-22 NOTE — Telephone Encounter (Signed)
Patient Advocate Encounter   Received notification from [facility] that prior authorization for Daliresp 250 MCG is required.   PA submitted on 06/22/2022 Key [id number] Status is pending       Joneen Boers, [place] Patient Advocate Specialist [place] Patient Advocate Team Direct Number: [phone] Fax: [phone]

## 2022-06-22 NOTE — Telephone Encounter (Signed)
Pharmacy Patient Advocate Encounter  Prior Authorization for Daliresp 250 MCG has been approved.    PA# [id number] Effective dates: 06/22/2022 through 08/08/2023

## 2022-06-23 ENCOUNTER — Encounter (HOSPITAL_COMMUNITY): Payer: Self-pay | Admitting: Emergency Medicine

## 2022-06-23 ENCOUNTER — Emergency Department (HOSPITAL_COMMUNITY)
Admission: EM | Admit: 2022-06-23 | Discharge: 2022-06-23 | Disposition: A | Discharge status: Home / Self Care | Payer: Medicare Other | Attending: Emergency Medicine | Admitting: Emergency Medicine

## 2022-06-23 ENCOUNTER — Emergency Department (HOSPITAL_COMMUNITY): Payer: Medicare Other

## 2022-06-23 ENCOUNTER — Other Ambulatory Visit: Payer: Self-pay

## 2022-06-23 DIAGNOSIS — R0902 Hypoxemia: Secondary | ICD-10-CM | POA: Diagnosis not present

## 2022-06-23 DIAGNOSIS — Z7951 Long term (current) use of inhaled steroids: Secondary | ICD-10-CM | POA: Insufficient documentation

## 2022-06-23 DIAGNOSIS — R0602 Shortness of breath: Secondary | ICD-10-CM | POA: Diagnosis not present

## 2022-06-23 DIAGNOSIS — Z20822 Contact with and (suspected) exposure to covid-19: Secondary | ICD-10-CM | POA: Insufficient documentation

## 2022-06-23 DIAGNOSIS — Z743 Need for continuous supervision: Secondary | ICD-10-CM | POA: Diagnosis not present

## 2022-06-23 DIAGNOSIS — J441 Chronic obstructive pulmonary disease with (acute) exacerbation: Secondary | ICD-10-CM | POA: Diagnosis not present

## 2022-06-23 DIAGNOSIS — Z7401 Bed confinement status: Secondary | ICD-10-CM | POA: Diagnosis not present

## 2022-06-23 DIAGNOSIS — I499 Cardiac arrhythmia, unspecified: Secondary | ICD-10-CM | POA: Diagnosis not present

## 2022-06-23 DIAGNOSIS — R069 Unspecified abnormalities of breathing: Secondary | ICD-10-CM | POA: Diagnosis not present

## 2022-06-23 DIAGNOSIS — J449 Chronic obstructive pulmonary disease, unspecified: Secondary | ICD-10-CM | POA: Diagnosis not present

## 2022-06-23 DIAGNOSIS — R6889 Other general symptoms and signs: Secondary | ICD-10-CM | POA: Diagnosis not present

## 2022-06-23 DIAGNOSIS — R0603 Acute respiratory distress: Secondary | ICD-10-CM | POA: Diagnosis present

## 2022-06-23 LAB — CBC WITH DIFFERENTIAL/PLATELET
Abs Immature Granulocytes: 0 10*3/uL (ref 0.00–0.07)
Basophils Absolute: 0 10*3/uL (ref 0.0–0.1)
Basophils Relative: 0 %
Eosinophils Absolute: 0 10*3/uL (ref 0.0–0.5)
Eosinophils Relative: 0 %
HCT: 41.8 % (ref 39.0–52.0)
Hemoglobin: 13 g/dL (ref 13.0–17.0)
Lymphocytes Relative: 11 %
Lymphs Abs: 1.7 10*3/uL (ref 0.7–4.0)
MCH: 29.1 pg (ref 26.0–34.0)
MCHC: 31.1 g/dL (ref 30.0–36.0)
MCV: 93.5 fL (ref 80.0–100.0)
Monocytes Absolute: 3.3 10*3/uL — ABNORMAL HIGH (ref 0.1–1.0)
Monocytes Relative: 22 %
Neutro Abs: 10.1 10*3/uL — ABNORMAL HIGH (ref 1.7–7.7)
Neutrophils Relative %: 67 %
Platelets: 144 10*3/uL — ABNORMAL LOW (ref 150–400)
RBC: 4.47 MIL/uL (ref 4.22–5.81)
RDW: 14.6 % (ref 11.5–15.5)
WBC: 15.1 10*3/uL — ABNORMAL HIGH (ref 4.0–10.5)
nRBC: 0 % (ref 0.0–0.2)
nRBC: 0 /100 WBC

## 2022-06-23 LAB — I-STAT VENOUS BLOOD GAS, ED
Acid-Base Excess: 5 mmol/L — ABNORMAL HIGH (ref 0.0–2.0)
Bicarbonate: 31.4 mmol/L — ABNORMAL HIGH (ref 20.0–28.0)
Calcium, Ion: 1.16 mmol/L (ref 1.15–1.40)
HCT: 40 % (ref 39.0–52.0)
Hemoglobin: 13.6 g/dL (ref 13.0–17.0)
O2 Saturation: 89 %
Potassium: 3.9 mmol/L (ref 3.5–5.1)
Sodium: 141 mmol/L (ref 135–145)
TCO2: 33 mmol/L — ABNORMAL HIGH (ref 22–32)
pCO2, Ven: 51.3 mmHg (ref 44–60)
pH, Ven: 7.394 (ref 7.25–7.43)
pO2, Ven: 57 mmHg — ABNORMAL HIGH (ref 32–45)

## 2022-06-23 LAB — BASIC METABOLIC PANEL
Anion gap: 8 (ref 5–15)
BUN: 19 mg/dL (ref 8–23)
CO2: 29 mmol/L (ref 22–32)
Calcium: 8.6 mg/dL — ABNORMAL LOW (ref 8.9–10.3)
Chloride: 101 mmol/L (ref 98–111)
Creatinine, Ser: 0.74 mg/dL (ref 0.61–1.24)
GFR, Estimated: >60 mL/min (ref >60)
Glucose, Bld: 101 mg/dL — ABNORMAL HIGH (ref 70–99)
Potassium: 3.9 mmol/L (ref 3.5–5.1)
Sodium: 138 mmol/L (ref 135–145)

## 2022-06-23 LAB — BRAIN NATRIURETIC PEPTIDE: B Natriuretic Peptide: 133.2 pg/mL — ABNORMAL HIGH (ref 0.0–100.0)

## 2022-06-23 LAB — TROPONIN I (HIGH SENSITIVITY)
Troponin I (High Sensitivity): 27 ng/L — ABNORMAL HIGH (ref <18)
Troponin I (High Sensitivity): 28 ng/L — ABNORMAL HIGH (ref <18)

## 2022-06-23 LAB — RESP PANEL BY RT-PCR (FLU A&B, COVID) ARPGX2
Influenza A by PCR: NEGATIVE
Influenza B by PCR: NEGATIVE
SARS Coronavirus 2 by RT PCR: NEGATIVE

## 2022-06-23 MED ORDER — METHYLPREDNISOLONE SODIUM SUCC 125 MG IJ SOLR
125.0000 mg | Freq: Once | INTRAMUSCULAR | Status: AC
  Administered 2022-06-23: 125 mg via INTRAVENOUS
  Filled 2022-06-23: qty 2

## 2022-06-23 MED ORDER — PREDNISONE 10 MG PO TABS: 20.0000 mg | ORAL_TABLET | Freq: Every day | ORAL | 0 refills | Status: AC

## 2022-06-23 MED ORDER — IPRATROPIUM-ALBUTEROL 0.5-2.5 (3) MG/3ML IN SOLN
3.0000 mL | Freq: Once | RESPIRATORY_TRACT | Status: AC
  Administered 2022-06-23: 3 mL via RESPIRATORY_TRACT
  Filled 2022-06-23: qty 3

## 2022-06-23 NOTE — Progress Notes (Signed)
RT in to assess pt on bipap. Upon arrival pt off at this time on a 4L nasal cannula. No distress noted. RT will continue to monitor and be available as needed.

## 2022-06-23 NOTE — ED Provider Notes (Signed)
[facility] EMERGENCY DEPARTMENT Provider Note   CSN: [id number] Arrival date & time: 06/23/22  0750     History  Chief Complaint  Patient presents with   Respiratory Distress    Cory Kemp is a 81 y.o. male.  81 year old male with prior medical history as detailed below presents for evaluation.  Patient with complaint of shortness of breath.  EMS reports that the patient was in mild respiratory distress on their initial evaluation.  Patient placed on BiPAP on arrival to the ED.  Patient is on 4 L nasal cannula at home.  Patient reports that his symptoms began early this morning just prior to his calling EMS.  Patient reports that during transport his symptoms improved significantly.  He appears to be comfortable on arrival.  The history is provided by the patient and medical records.       Home Medications Prior to Admission medications   Medication Sig Start Date End Date Taking? Authorizing Provider  acetaminophen (TYLENOL) 500 MG tablet Take 1,000 mg by mouth every 6 (six) hours as needed for mild pain.    [provider]  albuterol (PROVENTIL) (2.5 MG/3ML) 0.083% nebulizer solution Take 3 mLs (2.5 mg total) by nebulization every 6 (six) hours as needed for wheezing or shortness of breath. 01/03/18   Lauraine Rinne, NP  albuterol (VENTOLIN HFA) 108 (90 Base) MCG/ACT inhaler INHALE 2 PUFFS BY MOUTH EVERY 6 HOURS AS NEEDED FOR WHEEZING OR SHORTNESS OF BREATH Patient taking differently: Inhale 1-2 puffs into the lungs every 6 (six) hours as needed for wheezing or shortness of breath. 06/02/22   Mannam, Hart Robinsons, MD  ascorbic acid (VITAMIN C) 500 MG tablet Take 1 tablet (500 mg total) by mouth daily. 04/25/20   Allie Bossier, MD  Budeson-Glycopyrrol-Formoterol (BREZTRI AEROSPHERE) 160-9-4.8 MCG/ACT AERO Inhale 2 puffs into the lungs in the morning and at bedtime. 01/31/22   Mannam, Hart Robinsons, MD  carvedilol (COREG) 12.5 MG tablet Take 1 tablet (12.5  mg total) by mouth 2 (two) times daily with a meal. 01/08/22   Ghimire, Henreitta Leber, MD  cholecalciferol (VITAMIN D3) 25 MCG (1000 UNIT) tablet Take 1,000 Units by mouth daily.    [provider]  DALIRESP 250 MCG TABS TAKE 1 TABLET BY MOUTH EVERY MORNING Patient taking differently: Take 250 mg by mouth every morning. 05/17/22   Mannam, Hart Robinsons, MD  diclofenac Sodium (VOLTAREN) 1 % GEL Apply to affected area twice a day 06/09/22   Holley Bouche, MD  escitalopram (LEXAPRO) 20 MG tablet Take 20 mg by mouth daily. 06/15/21   [provider]  fluticasone (FLONASE) 50 MCG/ACT nasal spray Place 2 sprays into both nostrils daily. Reduce to 1 spray each nostril daily after stopping Afrin Patient not taking: Reported on 06/12/2022 01/08/22   Jonetta Osgood, MD  guaiFENesin (MUCINEX) 600 MG 12 hr tablet Take 1 tablet (600 mg total) by mouth 2 (two) times daily as needed for cough or to loosen phlegm. 06/16/22   Kathie Dike, MD  loratadine (CLARITIN) 10 MG tablet Take 1 tablet (10 mg total) by mouth daily for 15 days. 01/08/22 01/23/22  Ghimire, Henreitta Leber, MD  Menthol-Camphor (ICY HOT ADVANCED PAIN RELIEF) 16-11 % CREA Apply 1 Application topically 2 (two) times daily as needed (pain).    [provider]  montelukast (SINGULAIR) 10 MG tablet Take 10 mg by mouth daily. 04/23/21   [provider]  OXYGEN Inhale 4 L into the lungs continuous.  [provider]  oxymetazoline (AFRIN) 0.05 % nasal spray Place 1 spray into both nostrils 2 (two) times daily.    [provider]  predniSONE (DELTASONE) 10 MG tablet Take '40mg'$  po daily for 2 days then '30mg'$  daily for 2 days then '20mg'$  daily for 2 days then '10mg'$  daily for 2 days then stop 06/16/22   Kathie Dike, MD  simvastatin (ZOCOR) 40 MG tablet Take 40 mg by mouth every evening. 06/04/21   [provider]  tiZANidine (ZANAFLEX) 2 MG tablet Take 1 tablet (2 mg total) by mouth at bedtime. 06/09/22   Holley Bouche, MD  vitamin B-12 (CYANOCOBALAMIN) 500 MCG tablet Take 1,000 mcg by mouth daily.    [provider]      Allergies    Adhesive [tape] and Tegaderm alginate ag rope    Review of Systems   Review of Systems  All other systems reviewed and are negative.   Physical Exam Updated Vital Signs BP (!) 136/56   Pulse 100   Temp 97.6 F (36.4 C) (Oral)   Resp (!) 25   Ht '6\' 1"'$  (1.854 m)   Wt 94 kg   SpO2 98%   BMI 27.34 kg/m  Physical Exam Vitals and nursing note reviewed.  Constitutional:      General: He is not in acute distress.    Appearance: Normal appearance. He is well-developed.  HENT:     Head: Normocephalic and atraumatic.  Eyes:     Conjunctiva/sclera: Conjunctivae normal.     Pupils: Pupils are equal, round, and reactive to light.  Cardiovascular:     Rate and Rhythm: Normal rate and regular rhythm.     Comments: Trace expiratory wheezes in all lung fields Pulmonary:     Effort: Pulmonary effort is normal. No respiratory distress.     Breath sounds: Normal breath sounds.  Abdominal:     General: There is no distension.     Palpations: Abdomen is soft.     Tenderness: There is no abdominal tenderness.  Musculoskeletal:        General: No deformity. Normal range of motion.     Cervical back: Normal range of motion and neck supple.  Skin:    General: Skin is warm and dry.  Neurological:     General: No focal deficit present.     Mental Status: He is alert and oriented to person, place, and time.     ED Results / Procedures / Treatments   Labs (all labs ordered are listed, but only abnormal results are displayed) Labs Reviewed  CBC WITH DIFFERENTIAL/PLATELET - Abnormal; Notable for the following components:      Result Value   WBC 15.1 (*)    Platelets 144 (*)    Neutro Abs 10.1 (*)    Monocytes Absolute 3.3 (*)    All other components within normal limits  I-STAT VENOUS BLOOD GAS, ED - Abnormal; Notable for the following components:    pO2, Ven 57 (*)    Bicarbonate 31.4 (*)    TCO2 33 (*)    Acid-Base Excess 5.0 (*)    All other components within normal limits  RESP PANEL BY RT-PCR (FLU A&B, COVID) ARPGX2  BASIC METABOLIC PANEL  BRAIN NATRIURETIC PEPTIDE  PATHOLOGIST SMEAR REVIEW  TROPONIN I (HIGH SENSITIVITY)    EKG EKG Interpretation  Date/Time:  Thursday June 23 2022 07:53:11 EST Ventricular Rate:  111 PR Interval:  179 QRS Duration: 93 QT Interval:  325 QTC Calculation:  442 R Axis:   77 Text Interpretation: Sinus tachycardia Multiform ventricular premature complexes Consider left atrial enlargement Anterior infarct, old Confirmed by Dene Gentry [phone]) on 06/23/2022 8:01:39 AM  Radiology DG Chest Port 1 View  Result Date: 06/23/2022 CLINICAL DATA:  Shortness of breath EXAM: PORTABLE CHEST 1 VIEW COMPARISON:  Previous studies including the examination of 06/12/2022 FINDINGS: Transverse diameter of heart is increased. Low position of diaphragms suggests COPD. There are no signs of alveolar pulmonary edema. Crowding of markings in the medial right lower lung fields may be due to large bullous emphysema in right lower lung fields seen in the previous CT. There is no focal pulmonary consolidation. There is no pleural effusion or pneumothorax. IMPRESSION: COPD. Increased markings in the medial right lower lung field may suggest crowding of bronchovascular structures due to large bulla in the lateral aspect of right lower lung field or suggest subsegmental atelectasis/pneumonia. Electronically Signed   By: Elmer Picker M.D.   On: 06/23/2022 08:26    Procedures Procedures    Medications Ordered in ED Medications  methylPREDNISolone sodium succinate (SOLU-MEDROL) 125 mg/2 mL injection 125 mg (125 mg Intravenous Given 06/23/22 0810)  ipratropium-albuterol (DUONEB) 0.5-2.5 (3) MG/3ML nebulizer solution 3 mL (3 mLs Nebulization Given 06/23/22 5883)    ED Course/ Medical Decision Making/ A&P                            Medical Decision Making Amount and/or Complexity of Data Reviewed Labs: ordered. Radiology: ordered.  Risk Prescription drug management.    Medical Screen Complete  This patient presented to the ED with complaint of shortness of breath.  This complaint involves an extensive number of treatment options. The initial differential diagnosis includes, but is not limited to, COPD exacerbation  This presentation is: Acute, Chronic, Self-Limited, Previously Undiagnosed, Uncertain Prognosis, Complicated, Systemic Symptoms, and Threat to Life/Bodily Function  Patient with longstanding history of COPD presents with increased shortness of breath.  Presentation is consistent with mild to moderate COPD exacerbation.  Patient is on 4 L nasal cannula at baseline.  Symptoms improved significantly during ED evaluation.  Patient desires discharge home after ED evaluation and work-up.  He does understand need for close outpatient follow-up.  Patient's daughter contacted and agrees with plan for discharge home.  Patient ambulated around the department prior to discharge without significant desaturation or distress noted.  Importance of close follow-up is repeatedly stressed with the patient.  Strict return precautions given and understood.    Additional history obtained:  External records from outside sources obtained and reviewed including prior ED visits and prior Inpatient records.    Lab Tests:  I ordered and personally interpreted labs.  The pertinent results include: CBC, BMP, VBG, COVID, flu, troponin   Imaging Studies ordered:  I ordered imaging studies including chest x-ray I independently visualized and interpreted obtained imaging which showed NAD I agree with the radiologist interpretation.   Cardiac Monitoring:  The patient was maintained on a cardiac monitor.  I personally viewed and interpreted the cardiac monitor which showed an underlying rhythm of:  NSR   Medicines ordered:  I ordered medication including DuoNeb, Solu-Medrol for COPD exacerbation Reevaluation of the patient after these medicines showed that the patient: improved   Problem List / ED Course:  Shortness of breath   Reevaluation:  After the interventions noted above, I reevaluated the patient and found that they have: improved   Disposition:  After consideration  of the diagnostic results and the patients response to treatment, I feel that the patent would benefit from close outpatient follow-up.  \        Final Clinical Impression(s) / ED Diagnoses Final diagnoses:  COPD exacerbation ([place])    Rx / DC Orders ED Discharge Orders     None         Valarie Merino, MD 06/23/22 1537

## 2022-06-23 NOTE — ED Triage Notes (Signed)
Pt arrives via EMS from home with resp distress upon waking up. Pt wears 4L O2 baseline. EMS reports O2 was 88% on home O2 with decreased breath sounds. Pt was unable to tolerate cpap, arrives on NRB. Pt placed on bipap on ED arrival

## 2022-06-23 NOTE — Discharge Instructions (Addendum)
Return for any problem.  ?

## 2022-06-24 LAB — PATHOLOGIST SMEAR REVIEW

## 2022-06-26 ENCOUNTER — Other Ambulatory Visit: Payer: Self-pay | Admitting: Student

## 2022-07-08 ENCOUNTER — Other Ambulatory Visit: Payer: Self-pay | Admitting: Pulmonary Disease

## 2022-07-10 ENCOUNTER — Emergency Department (HOSPITAL_COMMUNITY): Payer: Medicare Other

## 2022-07-10 ENCOUNTER — Other Ambulatory Visit: Payer: Self-pay

## 2022-07-10 ENCOUNTER — Encounter (HOSPITAL_COMMUNITY): Payer: Self-pay | Admitting: *Deleted

## 2022-07-10 ENCOUNTER — Inpatient Hospital Stay (HOSPITAL_COMMUNITY)
Admission: EM | Admit: 2022-07-10 | Discharge: 2022-08-08 | DRG: 871 | Disposition: E | Payer: Medicare Other | Attending: Internal Medicine | Admitting: Internal Medicine

## 2022-07-10 DIAGNOSIS — N400 Enlarged prostate without lower urinary tract symptoms: Secondary | ICD-10-CM | POA: Diagnosis present

## 2022-07-10 DIAGNOSIS — A419 Sepsis, unspecified organism: Principal | ICD-10-CM | POA: Insufficient documentation

## 2022-07-10 DIAGNOSIS — I10 Essential (primary) hypertension: Secondary | ICD-10-CM | POA: Diagnosis present

## 2022-07-10 DIAGNOSIS — Z1152 Encounter for screening for COVID-19: Secondary | ICD-10-CM

## 2022-07-10 DIAGNOSIS — Z888 Allergy status to other drugs, medicaments and biological substances status: Secondary | ICD-10-CM

## 2022-07-10 DIAGNOSIS — F0390 Unspecified dementia without behavioral disturbance: Secondary | ICD-10-CM | POA: Diagnosis present

## 2022-07-10 DIAGNOSIS — Z833 Family history of diabetes mellitus: Secondary | ICD-10-CM

## 2022-07-10 DIAGNOSIS — F32A Depression, unspecified: Secondary | ICD-10-CM | POA: Diagnosis present

## 2022-07-10 DIAGNOSIS — J9622 Acute and chronic respiratory failure with hypercapnia: Secondary | ICD-10-CM | POA: Diagnosis present

## 2022-07-10 DIAGNOSIS — M8580 Other specified disorders of bone density and structure, unspecified site: Secondary | ICD-10-CM | POA: Diagnosis present

## 2022-07-10 DIAGNOSIS — Z66 Do not resuscitate: Secondary | ICD-10-CM | POA: Diagnosis present

## 2022-07-10 DIAGNOSIS — E119 Type 2 diabetes mellitus without complications: Secondary | ICD-10-CM | POA: Diagnosis present

## 2022-07-10 DIAGNOSIS — Z801 Family history of malignant neoplasm of trachea, bronchus and lung: Secondary | ICD-10-CM

## 2022-07-10 DIAGNOSIS — Z79899 Other long term (current) drug therapy: Secondary | ICD-10-CM

## 2022-07-10 DIAGNOSIS — Z515 Encounter for palliative care: Secondary | ICD-10-CM

## 2022-07-10 DIAGNOSIS — Z7951 Long term (current) use of inhaled steroids: Secondary | ICD-10-CM

## 2022-07-10 DIAGNOSIS — Z87891 Personal history of nicotine dependence: Secondary | ICD-10-CM

## 2022-07-10 DIAGNOSIS — G9341 Metabolic encephalopathy: Secondary | ICD-10-CM | POA: Diagnosis not present

## 2022-07-10 DIAGNOSIS — Z9981 Dependence on supplemental oxygen: Secondary | ICD-10-CM

## 2022-07-10 DIAGNOSIS — F0394 Unspecified dementia, unspecified severity, with anxiety: Secondary | ICD-10-CM | POA: Diagnosis not present

## 2022-07-10 DIAGNOSIS — E785 Hyperlipidemia, unspecified: Secondary | ICD-10-CM | POA: Insufficient documentation

## 2022-07-10 DIAGNOSIS — J189 Pneumonia, unspecified organism: Secondary | ICD-10-CM | POA: Diagnosis present

## 2022-07-10 DIAGNOSIS — J9621 Acute and chronic respiratory failure with hypoxia: Secondary | ICD-10-CM | POA: Diagnosis present

## 2022-07-10 DIAGNOSIS — F0393 Unspecified dementia, unspecified severity, with mood disturbance: Secondary | ICD-10-CM | POA: Diagnosis not present

## 2022-07-10 DIAGNOSIS — J441 Chronic obstructive pulmonary disease with (acute) exacerbation: Secondary | ICD-10-CM | POA: Diagnosis not present

## 2022-07-10 DIAGNOSIS — Z7189 Other specified counseling: Secondary | ICD-10-CM | POA: Diagnosis not present

## 2022-07-10 DIAGNOSIS — J440 Chronic obstructive pulmonary disease with acute lower respiratory infection: Secondary | ICD-10-CM | POA: Diagnosis not present

## 2022-07-10 DIAGNOSIS — R000 Tachycardia, unspecified: Secondary | ICD-10-CM | POA: Diagnosis not present

## 2022-07-10 DIAGNOSIS — R0602 Shortness of breath: Secondary | ICD-10-CM | POA: Diagnosis not present

## 2022-07-10 LAB — CBC WITH DIFFERENTIAL/PLATELET
Abs Immature Granulocytes: 0 10*3/uL (ref 0.00–0.07)
Basophils Absolute: 0 10*3/uL (ref 0.0–0.1)
Basophils Relative: 0 %
Eosinophils Absolute: 0 10*3/uL (ref 0.0–0.5)
Eosinophils Relative: 0 %
HCT: 42.5 % (ref 39.0–52.0)
Hemoglobin: 13.1 g/dL (ref 13.0–17.0)
Lymphocytes Relative: 5 %
Lymphs Abs: 1.5 10*3/uL (ref 0.7–4.0)
MCH: 29.3 pg (ref 26.0–34.0)
MCHC: 30.8 g/dL (ref 30.0–36.0)
MCV: 95.1 fL (ref 80.0–100.0)
Monocytes Absolute: 5.5 10*3/uL — ABNORMAL HIGH (ref 0.1–1.0)
Monocytes Relative: 18 %
Neutro Abs: 23.6 10*3/uL — ABNORMAL HIGH (ref 1.7–7.7)
Neutrophils Relative %: 77 %
Platelets: 176 10*3/uL (ref 150–400)
RBC: 4.47 MIL/uL (ref 4.22–5.81)
RDW: 14.7 % (ref 11.5–15.5)
WBC: 30.7 10*3/uL — ABNORMAL HIGH (ref 4.0–10.5)
nRBC: 0 /100 WBC
nRBC: 0.1 % (ref 0.0–0.2)

## 2022-07-10 LAB — BASIC METABOLIC PANEL
Anion gap: 12 (ref 5–15)
BUN: 29 mg/dL — ABNORMAL HIGH (ref 8–23)
CO2: 27 mmol/L (ref 22–32)
Calcium: 9.3 mg/dL (ref 8.9–10.3)
Chloride: 101 mmol/L (ref 98–111)
Creatinine, Ser: 1.22 mg/dL (ref 0.61–1.24)
GFR, Estimated: 60 mL/min — ABNORMAL LOW (ref >60)
Glucose, Bld: 201 mg/dL — ABNORMAL HIGH (ref 70–99)
Potassium: 4.1 mmol/L (ref 3.5–5.1)
Sodium: 140 mmol/L (ref 135–145)

## 2022-07-10 LAB — I-STAT VENOUS BLOOD GAS, ED
Acid-Base Excess: 2 mmol/L (ref 0.0–2.0)
Bicarbonate: 29.8 mmol/L — ABNORMAL HIGH (ref 20.0–28.0)
Calcium, Ion: 1.24 mmol/L (ref 1.15–1.40)
HCT: 39 % (ref 39.0–52.0)
Hemoglobin: 13.3 g/dL (ref 13.0–17.0)
O2 Saturation: 97 %
Potassium: 4.1 mmol/L (ref 3.5–5.1)
Sodium: 140 mmol/L (ref 135–145)
TCO2: 32 mmol/L (ref 22–32)
pCO2, Ven: 62.2 mmHg — ABNORMAL HIGH (ref 44–60)
pH, Ven: 7.289 (ref 7.25–7.43)
pO2, Ven: 100 mmHg — ABNORMAL HIGH (ref 32–45)

## 2022-07-10 LAB — RESP PANEL BY RT-PCR (FLU A&B, COVID) ARPGX2
Influenza A by PCR: NEGATIVE
Influenza B by PCR: NEGATIVE
SARS Coronavirus 2 by RT PCR: NEGATIVE

## 2022-07-10 MED ORDER — METHYLPREDNISOLONE SODIUM SUCC 125 MG IJ SOLR
125.0000 mg | Freq: Once | INTRAMUSCULAR | Status: AC
  Administered 2022-07-10: 125 mg via INTRAVENOUS
  Filled 2022-07-10: qty 2

## 2022-07-10 MED ORDER — SODIUM CHLORIDE 0.9 % IV SOLN
1.0000 g | Freq: Once | INTRAVENOUS | Status: AC
  Administered 2022-07-10: 1 g via INTRAVENOUS
  Filled 2022-07-10: qty 10

## 2022-07-10 MED ORDER — PROCHLORPERAZINE EDISYLATE 10 MG/2ML IJ SOLN: 5.0000 mg | Freq: Four times a day (QID) | INTRAMUSCULAR | Status: DC | PRN

## 2022-07-10 MED ORDER — MAGNESIUM SULFATE 2 GM/50ML IV SOLN
2.0000 g | Freq: Once | INTRAVENOUS | Status: AC
  Administered 2022-07-10: 2 g via INTRAVENOUS
  Filled 2022-07-10: qty 50

## 2022-07-10 MED ORDER — ESCITALOPRAM OXALATE 10 MG PO TABS
20.0000 mg | ORAL_TABLET | Freq: Every day | ORAL | Status: DC
  Administered 2022-07-11: 20 mg via ORAL
  Filled 2022-07-10: qty 2

## 2022-07-10 MED ORDER — MELATONIN 3 MG PO TABS: 3.0000 mg | ORAL_TABLET | Freq: Every evening | ORAL | Status: DC | PRN

## 2022-07-10 MED ORDER — ACETAMINOPHEN 325 MG PO TABS: 650.0000 mg | ORAL_TABLET | Freq: Four times a day (QID) | ORAL | Status: DC | PRN

## 2022-07-10 MED ORDER — SODIUM CHLORIDE 0.9 % IV BOLUS
500.0000 mL | Freq: Once | INTRAVENOUS | Status: AC
  Administered 2022-07-10: 500 mL via INTRAVENOUS

## 2022-07-10 MED ORDER — POLYETHYLENE GLYCOL 3350 17 G PO PACK: 17.0000 g | PACK | Freq: Every day | ORAL | Status: DC | PRN

## 2022-07-10 MED ORDER — PREDNISONE 20 MG PO TABS: 20.0000 mg | ORAL_TABLET | Freq: Every day | ORAL | Status: DC

## 2022-07-10 MED ORDER — STERILE WATER FOR INJECTION IJ SOLN
INTRAMUSCULAR | Status: AC
  Filled 2022-07-10: qty 10

## 2022-07-10 MED ORDER — ENOXAPARIN SODIUM 40 MG/0.4ML IJ SOSY
40.0000 mg | PREFILLED_SYRINGE | INTRAMUSCULAR | Status: DC
  Administered 2022-07-10: 40 mg via SUBCUTANEOUS
  Filled 2022-07-10: qty 0.4

## 2022-07-10 MED ORDER — CARVEDILOL 3.125 MG PO TABS
3.1250 mg | ORAL_TABLET | Freq: Two times a day (BID) | ORAL | Status: DC
  Administered 2022-07-11: 3.125 mg via ORAL
  Filled 2022-07-10: qty 1

## 2022-07-10 MED ORDER — IPRATROPIUM-ALBUTEROL 0.5-2.5 (3) MG/3ML IN SOLN
3.0000 mL | Freq: Four times a day (QID) | RESPIRATORY_TRACT | Status: DC
  Administered 2022-07-10 – 2022-07-11 (×5): 3 mL via RESPIRATORY_TRACT
  Filled 2022-07-10 (×6): qty 3

## 2022-07-10 MED ORDER — ONDANSETRON HCL 4 MG/2ML IJ SOLN: 4.0000 mg | Freq: Once | INTRAMUSCULAR | Status: DC

## 2022-07-10 MED ORDER — MONTELUKAST SODIUM 10 MG PO TABS: 10.0000 mg | ORAL_TABLET | Freq: Every day | ORAL | Status: DC

## 2022-07-10 MED ORDER — IPRATROPIUM BROMIDE 0.02 % IN SOLN
0.5000 mg | Freq: Once | RESPIRATORY_TRACT | Status: AC
  Administered 2022-07-10: 0.5 mg via RESPIRATORY_TRACT
  Filled 2022-07-10: qty 2.5

## 2022-07-10 MED ORDER — ALBUTEROL SULFATE (2.5 MG/3ML) 0.083% IN NEBU
5.0000 mg | INHALATION_SOLUTION | Freq: Once | RESPIRATORY_TRACT | Status: AC
  Administered 2022-07-10: 5 mg via RESPIRATORY_TRACT
  Filled 2022-07-10: qty 6

## 2022-07-10 MED ORDER — METOPROLOL TARTRATE 5 MG/5ML IV SOLN
2.5000 mg | INTRAVENOUS | Status: AC
  Administered 2022-07-10: 2.5 mg via INTRAVENOUS
  Filled 2022-07-10: qty 5

## 2022-07-10 MED ORDER — SIMVASTATIN 20 MG PO TABS: 40.0000 mg | ORAL_TABLET | Freq: Every evening | ORAL | Status: DC

## 2022-07-10 MED ORDER — SODIUM CHLORIDE 0.9 % IV SOLN
500.0000 mg | Freq: Once | INTRAVENOUS | Status: AC
  Administered 2022-07-10: 500 mg via INTRAVENOUS
  Filled 2022-07-10: qty 5

## 2022-07-10 NOTE — Progress Notes (Signed)
RT placed patient back on BIPAP for increased WOB and desat.

## 2022-07-10 NOTE — Plan of Care (Signed)
Patient placed on BiPAP at 1720. 14/8/60% with a back up rate of 15. Current RR is 35-42. Patient resting and tolerating NIV well.

## 2022-07-10 NOTE — ED Provider Notes (Signed)
[facility] EMERGENCY DEPARTMENT Provider Note   CSN: [id number] Arrival date & time: 07/31/2022  1618     History  Chief Complaint  Patient presents with   Shortness of Breath    Cory Kemp is a 81 y.o. male.   Shortness of Breath  The patient is an 81 year old male with past medical history of BPH, COPD (on 4 L home O2), dementia, DM, HTN, HLD presenting for shortness of breath.  Patient states that he woke up this morning and was feeling worse shortness of breath than usual.  He has oxygen at home and has been using it but is unable to tell me exactly how many liters he wears at baseline.  He denies chest pain, nausea, vomiting, fevers, recent medication changes, lower extremity swelling, changes to cough or sputum production.  On arrival, the patient was not wearing oxygen, he was noted to be tachycardic with heart rate in the 130s, tachypneic, and hypoxic with SPO2 in the 70s on room air.     Home Medications Prior to Admission medications   Medication Sig Start Date End Date Taking? Authorizing Provider  acetaminophen (TYLENOL) 500 MG tablet Take 1,000 mg by mouth every 6 (six) hours as needed for mild pain.    [provider]  albuterol (PROVENTIL) (2.5 MG/3ML) 0.083% nebulizer solution Take 3 mLs (2.5 mg total) by nebulization every 6 (six) hours as needed for wheezing or shortness of breath. 01/03/18   Lauraine Rinne, NP  albuterol (VENTOLIN HFA) 108 (90 Base) MCG/ACT inhaler INHALE 2 PUFFS BY MOUTH EVERY 6 HOURS AS NEEDED FOR WHEEZING OR SHORTNESS OF BREATH Patient taking differently: Inhale 1-2 puffs into the lungs every 6 (six) hours as needed for wheezing or shortness of breath. 06/02/22   Mannam, Hart Robinsons, MD  ascorbic acid (VITAMIN C) 500 MG tablet Take 1 tablet (500 mg total) by mouth daily. 04/25/20   Allie Bossier, MD  BREZTRI AEROSPHERE 160-9-4.8 MCG/ACT AERO INHALE 2 PUFFS INTO THE LUNGS IN THE MORNING AND AT BEDTIME 07/08/22   Mannam,  Praveen, MD  carvedilol (COREG) 12.5 MG tablet Take 1 tablet (12.5 mg total) by mouth 2 (two) times daily with a meal. 01/08/22   Ghimire, Henreitta Leber, MD  cholecalciferol (VITAMIN D3) 25 MCG (1000 UNIT) tablet Take 1,000 Units by mouth daily.    [provider]  DALIRESP 250 MCG TABS TAKE 1 TABLET BY MOUTH EVERY MORNING Patient taking differently: Take 250 mg by mouth every morning. 05/17/22   Mannam, Hart Robinsons, MD  diclofenac Sodium (VOLTAREN) 1 % GEL Apply to affected area twice a day 06/09/22   Holley Bouche, MD  escitalopram (LEXAPRO) 20 MG tablet Take 20 mg by mouth daily. 06/15/21   [provider]  fluticasone (FLONASE) 50 MCG/ACT nasal spray Place 2 sprays into both nostrils daily. Reduce to 1 spray each nostril daily after stopping Afrin Patient not taking: Reported on 06/12/2022 01/08/22   Jonetta Osgood, MD  guaiFENesin (MUCINEX) 600 MG 12 hr tablet Take 1 tablet (600 mg total) by mouth 2 (two) times daily as needed for cough or to loosen phlegm. 06/16/22   Kathie Dike, MD  loratadine (CLARITIN) 10 MG tablet Take 1 tablet (10 mg total) by mouth daily for 15 days. 01/08/22 01/23/22  Ghimire, Henreitta Leber, MD  Menthol-Camphor (ICY HOT ADVANCED PAIN RELIEF) 16-11 % CREA Apply 1 Application topically 2 (two) times daily as needed (pain).    [provider]  montelukast (SINGULAIR) 10  MG tablet Take 10 mg by mouth daily. 04/23/21   [provider]  OXYGEN Inhale 4 L into the lungs continuous.    [provider]  oxymetazoline (AFRIN) 0.05 % nasal spray Place 1 spray into both nostrils 2 (two) times daily.    [provider]  predniSONE (DELTASONE) 10 MG tablet Take '40mg'$  po daily for 2 days then '30mg'$  daily for 2 days then '20mg'$  daily for 2 days then '10mg'$  daily for 2 days then stop 06/16/22   Kathie Dike, MD  predniSONE (DELTASONE) 10 MG tablet Take 2 tablets (20 mg total) by mouth daily. 06/23/22   Valarie Merino, MD  simvastatin (ZOCOR) 40 MG  tablet Take 40 mg by mouth every evening. 06/04/21   [provider]  tiZANidine (ZANAFLEX) 2 MG tablet Take 1 tablet (2 mg total) by mouth at bedtime. 06/09/22   Holley Bouche, MD  vitamin B-12 (CYANOCOBALAMIN) 500 MCG tablet Take 1,000 mcg by mouth daily.    [provider]      Allergies    Adhesive [tape] and Tegaderm alginate ag rope    Review of Systems   Review of Systems  Respiratory:  Positive for shortness of breath.   See HPI  Physical Exam Updated Vital Signs BP (!) 149/86   Pulse (!) 122   Temp 98.5 F (36.9 C) (Oral)   Resp (!) 28   Ht '6\' 1"'$  (1.854 m)   Wt 94 kg   SpO2 98%   BMI 27.34 kg/m  Physical Exam Vitals and nursing note reviewed.  Constitutional:      General: He is in acute distress.     Appearance: He is well-developed.  HENT:     Head: Normocephalic and atraumatic.  Eyes:     Conjunctiva/sclera: Conjunctivae normal.  Cardiovascular:     Rate and Rhythm: Regular rhythm. Tachycardia present.     Heart sounds: No murmur heard. Pulmonary:     Effort: Tachypnea, accessory muscle usage and respiratory distress present.     Breath sounds: Decreased breath sounds present.  Abdominal:     Palpations: Abdomen is soft.     Tenderness: There is no abdominal tenderness.  Musculoskeletal:        General: No swelling.     Cervical back: Neck supple.     Right lower leg: No edema.     Left lower leg: No edema.  Skin:    General: Skin is warm and dry.     Capillary Refill: Capillary refill takes less than 2 seconds.  Neurological:     General: No focal deficit present.     Mental Status: He is alert and oriented to person, place, and time.     Cranial Nerves: No cranial nerve deficit.     Motor: No weakness.  Psychiatric:        Mood and Affect: Mood normal. Mood is not anxious.     ED Results / Procedures / Treatments   Labs (all labs ordered are listed, but only abnormal results are displayed) Labs Reviewed  BASIC METABOLIC  PANEL - Abnormal; Notable for the following components:      Result Value   Glucose, Bld 201 (*)    BUN 29 (*)    GFR, Estimated 60 (*)    All other components within normal limits  CBC WITH DIFFERENTIAL/PLATELET - Abnormal; Notable for the following components:   WBC 30.7 (*)    Neutro Abs 23.6 (*)    Monocytes Absolute  5.5 (*)    All other components within normal limits  I-STAT VENOUS BLOOD GAS, ED - Abnormal; Notable for the following components:   pCO2, Ven 62.2 (*)    pO2, Ven 100 (*)    Bicarbonate 29.8 (*)    All other components within normal limits  RESP PANEL BY RT-PCR (FLU A&B, COVID) ARPGX2  CULTURE, BLOOD (ROUTINE X 2)  CULTURE, BLOOD (ROUTINE X 2)  BRAIN NATRIURETIC PEPTIDE  CBC WITH DIFFERENTIAL/PLATELET  COMPREHENSIVE METABOLIC PANEL  MAGNESIUM  PHOSPHORUS  PROCALCITONIN    EKG EKG Interpretation  Date/Time:  Sunday July 10 2022 16:35:19 EST Ventricular Rate:  136 PR Interval:  138 QRS Duration: 86 QT Interval:  298 QTC Calculation: 448 R Axis:   81 Text Interpretation: Sinus tachycardia with Premature atrial complexes with Abberant conduction Possible Anterior infarct , age undetermined Abnormal ECG When compared with ECG of 23-Jun-2022 07:53, PREVIOUS ECG IS PRESENT Confirmed by Nanda Quinton [phone]) on 07/15/2022 4:54:54 PM  Radiology DG Chest Port 1 View  Result Date: 07/29/2022 CLINICAL DATA:  Shortness of breath EXAM: PORTABLE CHEST 1 VIEW COMPARISON:  06/23/2022 FINDINGS: Heart is normal size. Patchy bilateral airspace opacities in the mid and lower lungs. Mild vascular congestion. No effusions or acute bony abnormality. Mild hyperinflation/COPD. Aortic atherosclerosis. IMPRESSION: Hyperinflation/COPD. Patchy bilateral airspace opacities, right greater than left. This could reflect asymmetric edema or infection. Electronically Signed   By: Rolm Baptise M.D.   On: 07/21/2022 19:07    Procedures Procedures    Medications Ordered in  ED Medications  enoxaparin (LOVENOX) injection 40 mg (has no administration in time range)  acetaminophen (TYLENOL) tablet 650 mg (has no administration in time range)  polyethylene glycol (MIRALAX / GLYCOLAX) packet 17 g (has no administration in time range)  melatonin tablet 3 mg (has no administration in time range)  prochlorperazine (COMPAZINE) injection 5 mg (has no administration in time range)  carvedilol (COREG) tablet 3.125 mg (has no administration in time range)  escitalopram (LEXAPRO) tablet 20 mg (has no administration in time range)  montelukast (SINGULAIR) tablet 10 mg (0 mg Oral Hold 07/16/2022 2200)  simvastatin (ZOCOR) tablet 40 mg (0 mg Oral Hold 07/09/2022 2136)  predniSONE (DELTASONE) tablet 20 mg (has no administration in time range)  ipratropium-albuterol (DUONEB) 0.5-2.5 (3) MG/3ML nebulizer solution 3 mL (3 mLs Nebulization Given 08/02/2022 2215)  metoprolol tartrate (LOPRESSOR) injection 2.5 mg (has no administration in time range)  magnesium sulfate IVPB 2 g 50 mL (0 g Intravenous Stopped 07/20/2022 1807)  methylPREDNISolone sodium succinate (SOLU-MEDROL) 125 mg/2 mL injection 125 mg (125 mg Intravenous Given 07/28/2022 1706)  albuterol (PROVENTIL) (2.5 MG/3ML) 0.083% nebulizer solution 5 mg (5 mg Nebulization Given 07/29/2022 1707)  ipratropium (ATROVENT) nebulizer solution 0.5 mg (0.5 mg Nebulization Given 08/02/2022 1708)  sterile water (preservative free) injection (  Given by Other 07/11/2022 1800)  cefTRIAXone (ROCEPHIN) 1 g in sodium chloride 0.9 % 100 mL IVPB (0 g Intravenous Stopped 08/04/2022 2125)  azithromycin (ZITHROMAX) 500 mg in sodium chloride 0.9 % 250 mL IVPB (0 mg Intravenous Stopped 07/21/2022 2232)  sodium chloride 0.9 % bolus 500 mL (500 mLs Intravenous New Bag/Given 07/18/2022 2135)    ED Course/ Medical Decision Making/ A&P                           Medical Decision Making  The patient is an 81 year old male with past medical history of BPH, COPD (on 4 L home O2),  dementia, DM, HTN, HLD presenting for shortness of breath.  The differential diagnosis considered includes: COPD exacerbation, CHF, ACS, PE, viral respiratory infection, pneumonia, sepsis, pneumothorax.  On initial presentation, the patient is tachycardic to the 130s, tachypneic to the 30s with significant accessory muscle use, and hypoxic with a SPO2 of 76% with a good waveform.  He arrived on room air and was quickly placed on 6 L nasal cannula which improved his oxygen saturation to the high 80s.  The patient's physical exam significant for a GCS of 15 and oriented x4 with diminished breath sounds bilaterally in all fields but with no associated wheeze.  He did not appear volume overloaded on physical exam.  Given the patient's work of breathing, and tachypnea respiratory therapy was called to place the patient on BiPAP.  He was given breathing treatments, steroids, magnesium.  I independently interpreted all parts of the patient's diagnostic work-up.  His diagnostic work-up included a VBG with pH of 7.289, PCO2 of 62.2, bicarb 29.8; CBC with white blood cell count elevated to 30.7, and hemoglobin of 13.1; BMP with glucose elevated 201, BUN elevated 29; BNP which was pending; and respiratory panel which was negative.  The patient received an EKG which was significant for sinus tachycardia with PACs.  He also received a chest x-ray which showed patchy airspace opacities right greater than left concerning for infection.  On reevaluation the patient was found to be tolerating BiPAP.  He was started on ceftriaxone and azithromycin.  The patient was admitted to the hospitalist service for management of COPD exacerbation.  Amount and/or Complexity of Data Reviewed Independent Historian: EMS External Data Reviewed: labs, radiology, ECG and notes. Labs: ordered. Decision-making details documented in ED Course. Radiology: ordered and independent interpretation performed. Decision-making details documented in  ED Course. ECG/medicine tests: ordered and independent interpretation performed. Decision-making details documented in ED Course. Discussion of management or test interpretation with external provider(s): Hospitalist   Patient's presentation is most consistent with acute presentation with potential threat to life or bodily function.          Final Clinical Impression(s) / ED Diagnoses Final diagnoses:  Acute on chronic respiratory failure with hypoxia [facility])    Rx / DC Orders ED Discharge Orders     None         Dani Gobble, MD 07/12/2022 2240    Margette Fast, MD 07/14/22 [phone]

## 2022-07-10 NOTE — H&P (Addendum)
History and Physical  NUNZIO BANET WNI:[id number] DOB: 1940-11-24 DOA: 07/15/2022  Referring physician: Dr. Freda Munro, [place] PCP: Jani Gravel, MD  Outpatient Specialists: None. Patient coming from: Home.  Chief Complaint: Shortness of breath.  HPI: OWENN ROTHERMEL is a 81 y.o. male with medical history significant for COPD on 4 L nasal cannula continuously, dementia, type 2 diabetes, hyperlipidemia, hypertension, BPH, who presented to [facility] ED from home via EMS due to sudden onset shortness of breath prior to calling EMS.  Upon EMS arrival the patient was on room air.  In the ED, the patient was placed on BiPAP due to increased work of breathing, hypoxia, and hypercarbia, with improvement.  His chest x-ray showed patchy bilateral infiltrates which could reflect asymmetric edema or infection.  Lab studies were remarkable for leukocytosis of 30,000.    Due to concern for pneumonia the patient was started on empiric IV antibiotics Rocephin and azithromycin.  EDP requested admission for further management.  The patient was admitted by [facility], hospitalist service.  ED Course: Tmax 98.5.  BP 140/64, pulse 97 respiratory rate 27, O2 saturation 98% on BiPAP.  Lab studies markable for WBC 30.7.  Glucose 201.  BUN 29 creatinine 1.22 with GFR of 60.  Review of Systems: Review of systems as noted in the HPI. All other systems reviewed and are negative.   Past Medical History:  Diagnosis Date   Alcohol abuse    Anemia    Arthritis    Asthma    B12 deficiency 11/03/2013   BPH (benign prostatic hypertrophy)    COPD (chronic obstructive pulmonary disease) (HCC)    Dementia (HCC)    ?   Diabetes mellitus ([place]) 01/10/2012   Femoral hernia, right 03/27/2012   Glucose intolerance (impaired glucose tolerance)    Hyperlipidemia    Hypertension    Incisional hernias - swiss-cheese-type periumbilical 10/14/1827   Inguinal hernia - right 01/10/2012   Leukopenia 01/29/2013   Monocytosis 01/29/2013   Normochromic anemia  01/29/2013   Osteopenia    Past Surgical History:  Procedure Laterality Date   HEMORRHOID SURGERY     HERNIA REPAIR     INGUINAL HERNIA REPAIR  02/24/2012   Procedure: LAPAROSCOPIC INGUINAL HERNIA;  Surgeon: Adin Hector, MD;  Location: [facility] ORS;  Service: General;  Laterality: N/A;   TUMOR EXCISION     intestinal after SBO ( Benign), appendectomy   VENTRAL HERNIA REPAIR  02/24/2012   Procedure: LAPAROSCOPIC VENTRAL HERNIA;  Surgeon: Adin Hector, MD;  Location: [facility] ORS;  Service: General;  Laterality: N/A;  lysis of adhesions, excision of abdominal wall lipomae    Social History:  reports that he quit smoking about 3 years ago. His smoking use included cigarettes. He started smoking about 66 years ago. He has a 63.00 pack-year smoking history. He has never used smokeless tobacco. He reports that he does not drink alcohol and does not use drugs.   Allergies  Allergen Reactions   Adhesive [Tape] Rash    No "PLASTIC" tape!!! Patient broke out in blisters!!   Tegaderm Alginate Ag Rope Rash    Family History  Problem Relation Age of Onset   Lung cancer Father    Lung cancer Mother    Diabetes Brother       Prior to Admission medications   Medication Sig Start Date End Date Taking? Authorizing Provider  acetaminophen (TYLENOL) 500 MG tablet Take 1,000 mg by mouth every 6 (six) hours as needed for mild pain.  [provider]  albuterol (PROVENTIL) (2.5 MG/3ML) 0.083% nebulizer solution Take 3 mLs (2.5 mg total) by nebulization every 6 (six) hours as needed for wheezing or shortness of breath. 01/03/18   Lauraine Rinne, NP  albuterol (VENTOLIN HFA) 108 (90 Base) MCG/ACT inhaler INHALE 2 PUFFS BY MOUTH EVERY 6 HOURS AS NEEDED FOR WHEEZING OR SHORTNESS OF BREATH Patient taking differently: Inhale 1-2 puffs into the lungs every 6 (six) hours as needed for wheezing or shortness of breath. 06/02/22   Mannam, Hart Robinsons, MD  ascorbic acid (VITAMIN C) 500 MG tablet Take 1 tablet (500  mg total) by mouth daily. 04/25/20   Allie Bossier, MD  BREZTRI AEROSPHERE 160-9-4.8 MCG/ACT AERO INHALE 2 PUFFS INTO THE LUNGS IN THE MORNING AND AT BEDTIME 07/08/22   Mannam, Praveen, MD  carvedilol (COREG) 12.5 MG tablet Take 1 tablet (12.5 mg total) by mouth 2 (two) times daily with a meal. 01/08/22   Ghimire, Henreitta Leber, MD  cholecalciferol (VITAMIN D3) 25 MCG (1000 UNIT) tablet Take 1,000 Units by mouth daily.    [provider]  DALIRESP 250 MCG TABS TAKE 1 TABLET BY MOUTH EVERY MORNING Patient taking differently: Take 250 mg by mouth every morning. 05/17/22   Mannam, Hart Robinsons, MD  diclofenac Sodium (VOLTAREN) 1 % GEL Apply to affected area twice a day 06/09/22   Holley Bouche, MD  escitalopram (LEXAPRO) 20 MG tablet Take 20 mg by mouth daily. 06/15/21   [provider]  fluticasone (FLONASE) 50 MCG/ACT nasal spray Place 2 sprays into both nostrils daily. Reduce to 1 spray each nostril daily after stopping Afrin Patient not taking: Reported on 06/12/2022 01/08/22   Jonetta Osgood, MD  guaiFENesin (MUCINEX) 600 MG 12 hr tablet Take 1 tablet (600 mg total) by mouth 2 (two) times daily as needed for cough or to loosen phlegm. 06/16/22   Kathie Dike, MD  loratadine (CLARITIN) 10 MG tablet Take 1 tablet (10 mg total) by mouth daily for 15 days. 01/08/22 01/23/22  Ghimire, Henreitta Leber, MD  Menthol-Camphor (ICY HOT ADVANCED PAIN RELIEF) 16-11 % CREA Apply 1 Application topically 2 (two) times daily as needed (pain).    [provider]  montelukast (SINGULAIR) 10 MG tablet Take 10 mg by mouth daily. 04/23/21   [provider]  OXYGEN Inhale 4 L into the lungs continuous.    [provider]  oxymetazoline (AFRIN) 0.05 % nasal spray Place 1 spray into both nostrils 2 (two) times daily.    [provider]  predniSONE (DELTASONE) 10 MG tablet Take '40mg'$  po daily for 2 days then '30mg'$  daily for 2 days then '20mg'$  daily for 2 days then '10mg'$  daily for 2 days then  stop 06/16/22   Kathie Dike, MD  predniSONE (DELTASONE) 10 MG tablet Take 2 tablets (20 mg total) by mouth daily. 06/23/22   Valarie Merino, MD  simvastatin (ZOCOR) 40 MG tablet Take 40 mg by mouth every evening. 06/04/21   [provider]  tiZANidine (ZANAFLEX) 2 MG tablet Take 1 tablet (2 mg total) by mouth at bedtime. 06/09/22   Holley Bouche, MD  vitamin B-12 (CYANOCOBALAMIN) 500 MCG tablet Take 1,000 mcg by mouth daily.    [provider]    Physical Exam: BP 139/64   Pulse 96   Temp 98.5 F (36.9 C) (Oral)   Resp (!) 30   Ht '6\' 1"'$  (1.854 m)   Wt 94 kg   SpO2 99%   BMI 27.34 kg/m  General: 81 y.o. year-old male well developed well nourished in no acute distress.  Alert and on BIPAP. Cardiovascular: Regular rate and rhythm with no rubs or gallops.  No thyromegaly or JVD noted.  No lower extremity edema. 2/4 pulses in all 4 extremities. Respiratory: Mild rales with no wheezes or rales.  Poor inspiratory effort. Abdomen: Soft nontender nondistended with normal bowel sounds x4 quadrants. Muskuloskeletal: No cyanosis, clubbing or edema noted bilaterally Neuro: CN II-XII intact, strength, sensation, reflexes Skin: No ulcerative lesions noted or rashes Psychiatry: Unable to assess judgement and mood while on BIPAP. Mood is appropriate for condition and setting.          Labs on Admission:  Basic Metabolic Panel: Recent Labs  Lab 07/28/2022 1628 07/24/2022 1734  NA 140 140  K 4.1 4.1  CL 101  --   CO2 27  --   GLUCOSE 201*  --   BUN 29*  --   CREATININE 1.22  --   CALCIUM 9.3  --    Liver Function Tests: No results for input(s): "AST", "ALT", "ALKPHOS", "BILITOT", "PROT", "ALBUMIN" in the last 168 hours. No results for input(s): "LIPASE", "AMYLASE" in the last 168 hours. No results for input(s): "AMMONIA" in the last 168 hours. CBC: Recent Labs  Lab 07/08/2022 1628 07/26/2022 1734  WBC 30.7*  --   NEUTROABS 23.6*  --   HGB 13.1 13.3  HCT 42.5  39.0  MCV 95.1  --   PLT 176  --    Cardiac Enzymes: No results for input(s): "CKTOTAL", "CKMB", "CKMBINDEX", "TROPONINI" in the last 168 hours.  BNP (last 3 results) Recent Labs    01/06/22 2023 06/12/22 0630 06/23/22 0809  BNP 112.2* 150.1* 133.2*    ProBNP (last 3 results) No results for input(s): "PROBNP" in the last 8760 hours.  CBG: No results for input(s): "GLUCAP" in the last 168 hours.  Radiological Exams on Admission: DG Chest Port 1 View  Result Date: 07/26/2022 CLINICAL DATA:  Shortness of breath EXAM: PORTABLE CHEST 1 VIEW COMPARISON:  06/23/2022 FINDINGS: Heart is normal size. Patchy bilateral airspace opacities in the mid and lower lungs. Mild vascular congestion. No effusions or acute bony abnormality. Mild hyperinflation/COPD. Aortic atherosclerosis. IMPRESSION: Hyperinflation/COPD. Patchy bilateral airspace opacities, right greater than left. This could reflect asymmetric edema or infection. Electronically Signed   By: Rolm Baptise M.D.   On: 07/28/2022 19:07    EKG: I independently viewed the EKG done and my findings are as followed: Sinus tachycardia rate of 136.  PACs present.  QTc 448.  Assessment/Plan Present on Admission:  Acute on chronic respiratory failure with hypoxia (HCC)  Active Problems:   Acute on chronic respiratory failure with hypoxia (HCC)  Acute on chronic hypoxic and hypercarbic respiratory failure secondary to CAP, COPD exacerbation, POA At baseline on 4 L nasal cannula--upon EMS arrival, the patient was on room air.  Unclear if compliant with home O2 supplementation. VBG on presentation, pH 7.289/62.2/100/29.8. Currently on BiPAP, wean off as tolerated. Treat underlying conditions, on empiric IV antibiotics Rocephin and azithromycin. Incentive spirometer when no longer on BiPAP. Resume home bronchodilators Mobilize as tolerated  Sepsis secondary to multifocal community-acquired pneumonia, POA Presented with leukocytosis greater  than 30,000, tachycardia 109, tachypnea 35, and chest x-ray with patchy infiltrates bilaterally. Suspect leukocytosis contributed by home prednisone. Started on Rocephin and azithromycin in the ED, continue. Obtain baseline procalcitonin Monitor fever curve and WBCs Follow peripheral blood cultures  Hypertension Resume home regimen when off BIPAP Monitor  vital signs  Hyperlipidemia Resume home Zocor when off BIPAP  Chronic anxiety/depression Resume home escitalopram When off BIPAP.      DVT prophylaxis: Subcu Lovenox daily  Code Status: Full code  Family Communication: None at bedside  Disposition Plan: Admitted to progressive care unit  Consults called: None.  Admission status: Inpatient status.   Status is: Inpatient The patient requires at least 2 midnights for further evaluation and treatment of present condition.   Kayleen Memos MD [facility] Pager [phone]  If 7PM-7AM, please contact night-coverage [email or web address] Password [facility]  07/19/2022, 8:29 PM

## 2022-07-10 NOTE — ED Provider Triage Note (Signed)
Emergency Medicine Provider Triage Evaluation Note  Cory Kemp , a 81 y.o. male  was evaluated in triage.  Pt complains of shortness of breath.  Per records it appears that he is on 4 L nasal cannula chronically, however it is unclear to me if he is using this all the time.  Reports worsening shortness of breath this morning.  States that sometimes he can do breathing exercises and symptoms improve, but today got worse.  He has baseline cough.  No fevers.  No lower extremity swelling.  He was admitted about a month ago for COPD exacerbation.  Review of Systems  Positive: Shortness of breath, cough Negative: Fever  Physical Exam  BP (!) 155/88   Pulse (!) 138   Temp 98.5 F (36.9 C) (Oral)   Resp (!) 28   SpO2 (!) 88%  Gen:   Awake, mild respiratory distress improving Resp:  Diminished lung sounds bilaterally, pursed lip breathing noted, no cyanosis MSK:   Moves extremities without difficulty  Other:  Tachycardia  Medical Decision Making  Medically screening exam initiated at 4:28 PM.  Appropriate orders placed.  Aline August was informed that the remainder of the evaluation will be completed by another provider, this initial triage assessment does not replace that evaluation, and the importance of remaining in the ED until their evaluation is complete.     Carlisle Cater, PA-C 07/28/2022 1630

## 2022-07-10 NOTE — ED Triage Notes (Signed)
C/o difficulty breathing for awhile  he has a history of thee same  he had more difficulty today   he wears 02 at homw but he does not know how many liters. No pain  hx of alcoholism  no longer

## 2022-07-10 NOTE — Progress Notes (Signed)
RT placed patient 4L nasal canula. Vitals signs stable at this time. Bedside RN aware. RT will follow up.

## 2022-07-11 DIAGNOSIS — Z515 Encounter for palliative care: Secondary | ICD-10-CM | POA: Diagnosis not present

## 2022-07-11 DIAGNOSIS — A419 Sepsis, unspecified organism: Secondary | ICD-10-CM | POA: Diagnosis not present

## 2022-07-11 DIAGNOSIS — I10 Essential (primary) hypertension: Secondary | ICD-10-CM | POA: Diagnosis not present

## 2022-07-11 DIAGNOSIS — J9621 Acute and chronic respiratory failure with hypoxia: Secondary | ICD-10-CM | POA: Diagnosis not present

## 2022-07-11 DIAGNOSIS — F0390 Unspecified dementia without behavioral disturbance: Secondary | ICD-10-CM

## 2022-07-11 DIAGNOSIS — Z7189 Other specified counseling: Secondary | ICD-10-CM

## 2022-07-11 DIAGNOSIS — J189 Pneumonia, unspecified organism: Secondary | ICD-10-CM

## 2022-07-11 DIAGNOSIS — E785 Hyperlipidemia, unspecified: Secondary | ICD-10-CM | POA: Insufficient documentation

## 2022-07-11 LAB — COMPREHENSIVE METABOLIC PANEL
ALT: 16 U/L (ref 0–44)
AST: 20 U/L (ref 15–41)
Albumin: 2.9 g/dL — ABNORMAL LOW (ref 3.5–5.0)
Alkaline Phosphatase: 54 U/L (ref 38–126)
Anion gap: 9 (ref 5–15)
BUN: 31 mg/dL — ABNORMAL HIGH (ref 8–23)
CO2: 27 mmol/L (ref 22–32)
Calcium: 8.7 mg/dL — ABNORMAL LOW (ref 8.9–10.3)
Chloride: 104 mmol/L (ref 98–111)
Creatinine, Ser: 1.04 mg/dL (ref 0.61–1.24)
GFR, Estimated: >60 mL/min (ref >60)
Glucose, Bld: 166 mg/dL — ABNORMAL HIGH (ref 70–99)
Potassium: 4.9 mmol/L (ref 3.5–5.1)
Sodium: 140 mmol/L (ref 135–145)
Total Bilirubin: 0.5 mg/dL (ref 0.3–1.2)
Total Protein: 6.3 g/dL — ABNORMAL LOW (ref 6.5–8.1)

## 2022-07-11 LAB — CBC WITH DIFFERENTIAL/PLATELET
Abs Immature Granulocytes: 0.62 10*3/uL — ABNORMAL HIGH (ref 0.00–0.07)
Basophils Absolute: 0 10*3/uL (ref 0.0–0.1)
Basophils Relative: 0 %
Eosinophils Absolute: 0 10*3/uL (ref 0.0–0.5)
Eosinophils Relative: 0 %
HCT: 38.7 % — ABNORMAL LOW (ref 39.0–52.0)
Hemoglobin: 11.9 g/dL — ABNORMAL LOW (ref 13.0–17.0)
Immature Granulocytes: 3 %
Lymphocytes Relative: 3 %
Lymphs Abs: 0.6 10*3/uL — ABNORMAL LOW (ref 0.7–4.0)
MCH: 29 pg (ref 26.0–34.0)
MCHC: 30.7 g/dL (ref 30.0–36.0)
MCV: 94.4 fL (ref 80.0–100.0)
Monocytes Absolute: 0.7 10*3/uL (ref 0.1–1.0)
Monocytes Relative: 3 %
Neutro Abs: 18.7 10*3/uL — ABNORMAL HIGH (ref 1.7–7.7)
Neutrophils Relative %: 91 %
Platelets: 151 10*3/uL (ref 150–400)
RBC: 4.1 MIL/uL — ABNORMAL LOW (ref 4.22–5.81)
RDW: 14.6 % (ref 11.5–15.5)
WBC: 20.6 10*3/uL — ABNORMAL HIGH (ref 4.0–10.5)
nRBC: 0.1 % (ref 0.0–0.2)

## 2022-07-11 LAB — PHOSPHORUS: Phosphorus: 4.3 mg/dL (ref 2.5–4.6)

## 2022-07-11 LAB — MAGNESIUM: Magnesium: 2.5 mg/dL — ABNORMAL HIGH (ref 1.7–2.4)

## 2022-07-11 LAB — PROCALCITONIN: Procalcitonin: 0.4 ng/mL

## 2022-07-11 MED ORDER — BIOTENE DRY MOUTH MT LIQD: 15.0000 mL | OROMUCOSAL | Status: DC | PRN

## 2022-07-11 MED ORDER — CARVEDILOL 3.125 MG PO TABS: 6.2500 mg | ORAL_TABLET | Freq: Two times a day (BID) | ORAL | Status: DC

## 2022-07-11 MED ORDER — METHYLPREDNISOLONE SODIUM SUCC 125 MG IJ SOLR
80.0000 mg | Freq: Every day | INTRAMUSCULAR | Status: DC
  Administered 2022-07-11: 80 mg via INTRAVENOUS
  Filled 2022-07-11 (×2): qty 2

## 2022-07-11 MED ORDER — MORPHINE SULFATE (PF) 2 MG/ML IV SOLN
1.0000 mg | INTRAVENOUS | Status: DC
  Administered 2022-07-11 – 2022-07-12 (×5): 1 mg via INTRAVENOUS
  Filled 2022-07-11 (×5): qty 1

## 2022-07-11 MED ORDER — GLYCOPYRROLATE 1 MG PO TABS
1.0000 mg | ORAL_TABLET | ORAL | Status: DC | PRN
  Filled 2022-07-11: qty 1

## 2022-07-11 MED ORDER — QUETIAPINE FUMARATE 25 MG PO TABS: 25.0000 mg | ORAL_TABLET | Freq: Every evening | ORAL | Status: DC | PRN

## 2022-07-11 MED ORDER — MORPHINE SULFATE (CONCENTRATE) 10 MG/0.5ML PO SOLN: 5.0000 mg | ORAL | Status: DC | PRN

## 2022-07-11 MED ORDER — LORAZEPAM 2 MG/ML PO CONC: 1.0000 mg | ORAL | Status: DC | PRN

## 2022-07-11 MED ORDER — GLYCOPYRROLATE 0.2 MG/ML IJ SOLN: 0.2000 mg | INTRAMUSCULAR | Status: DC | PRN

## 2022-07-11 MED ORDER — LORAZEPAM 1 MG PO TABS: 1.0000 mg | ORAL_TABLET | ORAL | Status: DC | PRN

## 2022-07-11 MED ORDER — MORPHINE SULFATE (PF) 2 MG/ML IV SOLN
2.0000 mg | INTRAVENOUS | Status: DC | PRN
  Administered 2022-07-12: 2 mg via INTRAVENOUS
  Filled 2022-07-11: qty 1

## 2022-07-11 MED ORDER — POLYVINYL ALCOHOL 1.4 % OP SOLN
1.0000 [drp] | Freq: Four times a day (QID) | OPHTHALMIC | Status: DC | PRN
  Filled 2022-07-11: qty 15

## 2022-07-11 MED ORDER — ONDANSETRON HCL 4 MG/2ML IJ SOLN: 4.0000 mg | Freq: Four times a day (QID) | INTRAMUSCULAR | Status: DC | PRN

## 2022-07-11 MED ORDER — LORAZEPAM 1 MG PO TABS
1.0000 mg | ORAL_TABLET | Freq: Three times a day (TID) | ORAL | Status: DC
  Administered 2022-07-11: 1 mg via ORAL
  Filled 2022-07-11: qty 1

## 2022-07-11 MED ORDER — GLYCOPYRROLATE 0.2 MG/ML IJ SOLN
0.2000 mg | INTRAMUSCULAR | Status: DC | PRN
  Administered 2022-07-11 – 2022-07-12 (×3): 0.2 mg via INTRAVENOUS
  Filled 2022-07-11 (×4): qty 1

## 2022-07-11 MED ORDER — ONDANSETRON 4 MG PO TBDP: 4.0000 mg | ORAL_TABLET | Freq: Four times a day (QID) | ORAL | Status: DC | PRN

## 2022-07-11 MED ORDER — SODIUM CHLORIDE 0.9 % IV SOLN: 500.0000 mg | INTRAVENOUS | Status: DC

## 2022-07-11 MED ORDER — LORAZEPAM 2 MG/ML IJ SOLN
1.0000 mg | INTRAMUSCULAR | Status: DC | PRN
  Administered 2022-07-11 – 2022-07-12 (×3): 1 mg via INTRAVENOUS
  Filled 2022-07-11 (×3): qty 1

## 2022-07-11 MED ORDER — SODIUM CHLORIDE 0.9 % IV SOLN: 1.0000 g | INTRAVENOUS | Status: DC

## 2022-07-11 NOTE — ED Notes (Signed)
Breathing easier, NAD, calmer, family temporarily has left BS.

## 2022-07-11 NOTE — Progress Notes (Signed)
[facility] PROGRESS NOTE    Progress Note  QUADRE BRISTOL  CWC:[id number] DOB: November 27, 1940 DOA: 08/01/2022 PCP: Jani Gravel, MD     Brief Narrative:   TIM CORRIHER is an 81 y.o. male past medical history significant for COPD on 4 L of oxygen dementia diabetes mellitus type 2 BPH comes in with sudden onset of shortness of breath was placed on BiPAP in the ED due to hypoxia and hypercarbia chest x-ray showed bilateral pulmonary infiltrates which are symmetric white count was 30,000, was started empirically on Rocephin and azithromycin   Assessment/Plan:   Acute respiratory failure with hypoxia probably secondary to community-acquired pneumonia and/or COPD exacerbation: Had to be placed on BiPAP in the ED. Was started on Rocephin azithromycin inhalers and steroids. Tmax 98.5, leukocytosis improving. Patient still requiring BiPAP to keep saturations greater 96%. Will try to wean oxygen.  Sepsis secondary to multifocal pneumonia: Started empirically on Rocephin and azithromycin. Procalcitonin was elevated. Blood cultures and previous culture has been sent. He has remained afebrile leukocytosis improving.  Essential hypertension Blood pressure stable continue beta-blocker hold all other antihypertensive medication.  Hyperlipidemia: Continue current meds once off BiPAP.  Acute metabolic encephalopathy superimposed on dementia without behavioral disturbance ([place]) He is at very high risk of aspiration and delirium. Use melatonin at night along with Seroquel. Likely due to community-acquired pneumonia.  Goals of care/ethics: Patient is a DNR we will go ahead and change him.  Had a long discussion with the daughter.  The daughter relates he is followed by hospice she would like to treat him for his community-acquired pneumonia and she would like to palliative Care to be involved in case she decompensates, she believes he would not want any aggressive measures.    DVT  prophylaxis: Lovenox Family Communication:none Status is: Inpatient Remains inpatient appropriate because: Acute respiratory failure with hypoxia secondary to pneumonia.    Code Status:     Code Status Orders  (From admission, onward)           Start     Ordered   07/15/2022 2028  Full code  Continuous        07/24/2022 2028           Code Status History     Date Active Date Inactive Code Status Order ID Comments User Context   06/12/2022 1346 06/16/2022 2145 DNR [id number]  Lin Landsman, NP ED   06/12/2022 0941 06/12/2022 1346 Full Code [id number]  Karmen Bongo, MD ED   01/07/2022 0208 01/08/2022 1917 Full Code [id number]  Orene Desanctis, DO ED   07/11/2021 2114 07/16/2021 2027 Full Code [id number]  Marcelyn Bruins, MD ED   04/20/2020 0155 04/25/2020 0331 Full Code [id number]  Mikki Harbor, MD ED   04/19/2020 2359 04/20/2020 0155 Full Code [id number]  Vallery Ridge, Mohammadtokir, MD ED   02/11/2019 1249 02/13/2019 1809 Full Code [id number]  Kerney Elbe, DO ED   02/09/2019 0114 02/09/2019 1834 Full Code [id number]  Vianne Bulls, MD Inpatient   12/25/2018 0456 12/26/2018 1533 Full Code [id number]  Shela Leff, MD ED   04/11/2016 0017 04/11/2016 1928 Full Code [id number]  Norval Morton, MD Inpatient         IV Access:   Peripheral IV   Procedures and diagnostic studies:   DG Chest Port 1 View  Result Date: 08/03/2022 CLINICAL DATA:  Shortness of breath EXAM: PORTABLE CHEST 1 VIEW COMPARISON:  06/23/2022 FINDINGS: Heart is normal size. Patchy bilateral airspace opacities in  the mid and lower lungs. Mild vascular congestion. No effusions or acute bony abnormality. Mild hyperinflation/COPD. Aortic atherosclerosis. IMPRESSION: Hyperinflation/COPD. Patchy bilateral airspace opacities, right greater than left. This could reflect asymmetric edema or infection. Electronically Signed   By: Rolm Baptise M.D.   On: 08/06/2022 19:07     Medical Consultants:   None.   Subjective:     JETTSON CRABLE not able to communicate  Objective:    Vitals:   07/11/22 0215 07/11/22 0300 07/11/22 0530 07/11/22 0544  BP:  129/61 (!) 143/71   Pulse:  84 78   Resp:  (!) 28 (!) 25   Temp: (!) 97.4 F (36.3 C)   97.7 F (36.5 C)  TempSrc: Axillary   Axillary  SpO2:  99% 97%   Weight:      Height:       SpO2: 97 % O2 Flow Rate (L/min): 4 L/min FiO2 (%): 50 %   Intake/Output Summary (Last 24 hours) at 07/11/2022 0657 Last data filed at 08/05/2022 2314 Gross per 24 hour  Intake 891.37 ml  Output --  Net 891.37 ml   Filed Weights   08/04/2022 1629  Weight: 94 kg    Exam: General exam: In no acute distress. Respiratory system: Good air movement and the crackles bilaterally Cardiovascular system: S1 & S2 heard, RRR. No JVD.  Gastrointestinal system: Abdomen is nondistended, soft and nontender.  Extremities: No pedal edema. Skin: No rashes, lesions or ulcers Psychiatry: No judgment or insight of medical condition.   Data Reviewed:    Labs: Basic Metabolic Panel: Recent Labs  Lab 07/09/2022 1628 07/30/2022 1734 07/11/22 0200  NA 140 140 140  K 4.1 4.1 4.9  CL 101  --  104  CO2 27  --  27  GLUCOSE 201*  --  166*  BUN 29*  --  31*  CREATININE 1.22  --  1.04  CALCIUM 9.3  --  8.7*  MG  --   --  2.5*  PHOS  --   --  4.3   GFR Estimated Creatinine Clearance: 63 mL/min (by C-G formula based on SCr of 1.04 mg/dL). Liver Function Tests: Recent Labs  Lab 07/11/22 0200  AST 20  ALT 16  ALKPHOS 54  BILITOT 0.5  PROT 6.3*  ALBUMIN 2.9*   No results for input(s): "LIPASE", "AMYLASE" in the last 168 hours. No results for input(s): "AMMONIA" in the last 168 hours. Coagulation profile No results for input(s): "INR", "PROTIME" in the last 168 hours. COVID-19 Labs  No results for input(s): "DDIMER", "FERRITIN", "LDH", "CRP" in the last 72 hours.  Lab Results  Component Value Date   SARSCOV2NAA NEGATIVE 07/08/2022   SARSCOV2NAA NEGATIVE 06/23/2022    SARSCOV2NAA NEGATIVE 06/12/2022   [place] NEGATIVE 07/11/2021    CBC: Recent Labs  Lab 07/28/2022 1628 07/18/2022 1734 07/11/22 0200  WBC 30.7*  --  20.6*  NEUTROABS 23.6*  --  18.7*  HGB 13.1 13.3 11.9*  HCT 42.5 39.0 38.7*  MCV 95.1  --  94.4  PLT 176  --  151   Cardiac Enzymes: No results for input(s): "CKTOTAL", "CKMB", "CKMBINDEX", "TROPONINI" in the last 168 hours. BNP (last 3 results) No results for input(s): "PROBNP" in the last 8760 hours. CBG: No results for input(s): "GLUCAP" in the last 168 hours. D-Dimer: No results for input(s): "DDIMER" in the last 72 hours. Hgb A1c: No results for input(s): "HGBA1C" in the last 72 hours. Lipid Profile: No results for input(s): "CHOL", "HDL", "  [place]", "TRIG", "CHOLHDL", "LDLDIRECT" in the last 72 hours. Thyroid function studies: No results for input(s): "TSH", "T4TOTAL", "T3FREE", "THYROIDAB" in the last 72 hours.  Invalid input(s): "FREET3" Anemia work up: No results for input(s): "VITAMINB12", "FOLATE", "FERRITIN", "TIBC", "IRON", "RETICCTPCT" in the last 72 hours. Sepsis Labs: Recent Labs  Lab 08/02/2022 1628 07/11/22 0200  PROCALCITON  --  0.40  WBC 30.7* 20.6*   Microbiology Recent Results (from the past 240 hour(s))  Resp Panel by RT-PCR (Flu A&B, Covid) Anterior Nasal Swab     Status: None   Collection Time: 07/15/2022  4:28 PM   Specimen: Anterior Nasal Swab  Result Value Ref Range Status   SARS Coronavirus 2 by RT PCR NEGATIVE NEGATIVE Final    Comment: (NOTE) SARS-CoV-2 target nucleic acids are NOT DETECTED.  The SARS-CoV-2 RNA is generally detectable in upper respiratory specimens during the acute phase of infection. The lowest concentration of SARS-CoV-2 viral copies this assay can detect is 138 copies/mL. A negative result does not preclude SARS-Cov-2 infection and should not be used as the sole basis for treatment or other patient management decisions. A negative result may occur with  improper  specimen collection/handling, submission of specimen other than nasopharyngeal swab, presence of viral mutation(s) within the areas targeted by this assay, and inadequate number of viral copies(<138 copies/mL). A negative result must be combined with clinical observations, patient history, and epidemiological information. The expected result is Negative.  Fact Sheet for Patients:  EntrepreneurPulse.com.au  Fact Sheet for Healthcare Providers:  IncredibleEmployment.be  This test is no t yet approved or cleared by the [place] FDA and  has been authorized for detection and/or diagnosis of SARS-CoV-2 by FDA under an Emergency Use Authorization (EUA). This EUA will remain  in effect (meaning this test can be used) for the duration of the COVID-19 declaration under Section 564(b)(1) of the Act, 21 U.S.C.section 360bbb-3(b)(1), unless the authorization is terminated  or revoked sooner.       Influenza A by PCR NEGATIVE NEGATIVE Final   Influenza B by PCR NEGATIVE NEGATIVE Final    Comment: (NOTE) The Xpert Xpress SARS-CoV-2/FLU/RSV plus assay is intended as an aid in the diagnosis of influenza from Nasopharyngeal swab specimens and should not be used as a sole basis for treatment. Nasal washings and aspirates are unacceptable for Xpert Xpress SARS-CoV-2/FLU/RSV testing.  Fact Sheet for Patients: EntrepreneurPulse.com.au  Fact Sheet for Healthcare Providers: IncredibleEmployment.be  This test is not yet approved or cleared by the [place] FDA and has been authorized for detection and/or diagnosis of SARS-CoV-2 by FDA under an Emergency Use Authorization (EUA). This EUA will remain in effect (meaning this test can be used) for the duration of the COVID-19 declaration under Section 564(b)(1) of the Act, 21 U.S.C. section 360bbb-3(b)(1), unless the authorization is terminated or revoked.  Performed at  [facility], [place] [address], [place], [place] [address]      Medications:    carvedilol  3.125 mg Oral BID WC   enoxaparin (LOVENOX) injection  40 mg Subcutaneous Q24H   escitalopram  20 mg Oral Daily   ipratropium-albuterol  3 mL Nebulization Q6H   montelukast  10 mg Oral QHS   predniSONE  20 mg Oral Daily   simvastatin  40 mg Oral QPM   Continuous Infusions:  azithromycin (ZITHROMAX) 500 mg in sodium chloride 0.9 % 250 mL IVPB     cefTRIAXone (ROCEPHIN)  IV        LOS: 1 day  Charlynne Cousins  [facility]  07/11/2022, 6:57 AM

## 2022-07-11 NOTE — Progress Notes (Signed)
RT called to assess pt. Pt's vitals stable with a RR of 21, sats of 95% and comfortably talking with family at bedside. BIPAP not needed at this time. RT will monitor.

## 2022-07-11 NOTE — Progress Notes (Signed)
SLP Cancellation Note  Patient Details Name: Cory Kemp MRN: [id number] DOB: 1940/12/11   Cancelled evaluation: Will hold on swallow assessment after D/W RN - pt is desaturating easily; BiPAP may be necessary again. Will f/u for readiness.  Deniss Wormley L. Tivis Ringer, MA CCC/SLP Clinical Specialist - Acute Care SLP Acute Rehabilitation Services Office number [phone]            Cory Kemp 07/11/2022, 11:25 AM

## 2022-07-11 NOTE — ED Notes (Signed)
Neb complete, not currently wearing [place], [place] re-placed back on pt, pt remains alert, active, tachypneic, talkative, animated, joking with family at [facility]. Presence of visitors may be effecting SPO2, activity and breathing.

## 2022-07-11 NOTE — Consult Note (Signed)
Consultation Note Date: 07/11/2022   Patient Name: Cory Kemp  DOB: [birth year]  MRN: [id number]  Age / Sex: 81 y.o., male  PCP: Jani Gravel, MD Referring Physician: Aileen Fass, Tammi Klippel, MD  Reason for Consultation: Establishing goals of care  HPI/Patient Profile: 81 y.o. male  with past medical history of COPD on 4 L nasal cannula continuously, dementia, type 2 diabetes, hyperlipidemia, hypertension, BPH admitted on 07/18/2022 with sudden onset shortness of breath.   In the ED, the patient was placed on BiPAP due to increased work of breathing, hypoxia, and hypercarbia, with improvement. Currently admitted for acute on chronic respiratory failure secondary to CAP. Patient is enrolled in [place] at home. PMT has been consulted to assist with goals of care conversation.  Clinical Assessment and Goals of Care:  I have reviewed medical records including EPIC notes, labs and imaging, received report from RN, assessed the patient and then met at the bedside with patient's daughter [name] and son Dayton to discuss diagnosis prognosis, [place], EOL wishes, disposition and options.  I introduced Palliative Medicine as specialized medical care for people living with serious illness. It focuses on providing relief from the symptoms and stress of a serious illness. The goal is to improve quality of life for both the patient and the family.  We discussed a brief life review of the patient and then focused on their current illness.  The natural disease trajectory and expectations at EOL were discussed.  I attempted to elicit values and goals of care important to the patient.    Medical History Review and Understanding:  Patient and his family have a good understanding of the severity of his illness and the incurable/irreversible nature of dementia and end-stage COPD.  They also understand his acute illness including  pneumonia and interventions such as BiPAP.  Social History: Patient has been widowed for 30 years.  He has 1 son and 1 daughter.  He previously enjoyed traveling all over the country.  Driving around town is his way of exploring now, although his dyspnea is making this more difficult to do.  His children are very concerned that he will not stop.  Functional and Nutritional State: Patient often states he has not eaten but then also states he is not hungry.  Dyspnea is worsened by exertion.  Palliative Symptoms: Dyspnea, anxiety  Advance Directives: A detailed discussion regarding advanced directives was had.   Code Status: Concepts specific to code status, artifical feeding and hydration, and rehospitalization were considered and discussed.  DNR confirmed.  Discussion: Clair Gulling is very open and honest about his feelings regarding end-of-life. He knows "his time" is coming and just wants to continue driving around and enjoying his freedom as long as possible. Emotional support and therapeutic listening was provided. We had a long conversation about his worsening symptoms. He is forgetful and requires frequent redirection, but he ultimately realizes he prefers trying medications for relief (opioids, benzodiazepines) than to continue suffering and putting others at risk while driving to his favorite locations. We discussed the option of arranging rides and visits from loved ones for socialization. His daughter and son are also concerned that is is no longer safe for him to return to his home, as he has only twice weekly visits from [facility] hospice. Thoroughly reviewed the option of transitioning to full comfort care and revisiting residential hospice placement, which was also discussed during a previous conversation with PMT but patient was deemed ineligible. The risks and benefits of limiting interventions such  as BiPAP were reviewed in detail. Patient is very much looking forward to eating again and  agreeable to comfort-focused care. Patient's 2 children are also in agreement that this is most aligned with patient's goals and values.    The difference between aggressive medical intervention and comfort care was considered in light of the patient's goals of care.   Discussed the importance of continued conversation with family and the medical providers regarding overall plan of care and treatment options, ensuring decisions are within the context of the patient's values and GOCs.   Questions and concerns were addressed.  Hard Choices booklet left for review. The family was encouraged to call with questions or concerns.  PMT will continue to support holistically.   SUMMARY OF RECOMMENDATIONS   -DNR confirmed -Transition to full comfort measures today IV Morphine scheduled q4h and available q1h PRN for pain/air hunger/comfort Robinul PRN for excessive secretions IV Ativan scheduled Q8H and available Q2H PRN for agitation/anxiety Zofran PRN for nausea Liquifilm tears PRN for dry eyes May have comfort feeding Comfort cart for family Unrestricted visitations in the setting of EOL (per policy) Oxygen PRN 2L or less for comfort. No escalation.   -Patient and his family are interested in referral to residential hospice, [facility] consulted for assistance -Psychosocial and emotional support provided -Spiritual care consult at patient's request -PMT will continue to follow and support  Prognosis:  Poor prognosis given acute respiratory failure requiring BiPAP, dementia, COPD, and several other underlying comorbidities with recent decline in functional status  Discharge Planning: Hospice facility if eligible     Primary Diagnoses: Present on Admission:  Acute on chronic respiratory failure with hypoxia ([place])  Dementia without behavioral disturbance ([place])  Essential hypertension  Physical Exam Vitals and nursing note reviewed.  Constitutional:      General: He is not in acute distress.     Appearance: He is ill-appearing.     Interventions: Nasal cannula in place.     Comments: 6L  Cardiovascular:     Rate and Rhythm: Normal rate.  Pulmonary:     Effort: Tachypnea present.  Neurological:     Mental Status: He is alert. He is confused.  Psychiatric:        Mood and Affect: Mood normal.        Speech: Speech is rapid and pressured.        Cognition and Memory: Cognition is impaired.    Vital Signs: BP (!) 144/68   Pulse 95   Temp 98 F (36.7 C) (Oral)   Resp 19   Ht _0  (1.854 m)   Wt 94 kg   SpO2 (!) 87%   BMI 27.34 kg/m      Pain Score: 0-No pain  SpO2: SpO2: (!) 87 % O2 Device:SpO2: (!) 87 % O2 Flow Rate: .O2 Flow Rate (L/min): 4 L/min  Palliative Assessment/Data:      Total time: I spent 123 minutes in the care of the patient today in the above activities and documenting the encounter.  MDM: high    Daphnie Venturini Johnnette Litter, PA-C  Palliative Medicine Team Team phone # [phone]  Thank you for allowing the Palliative Medicine Team to assist in the care of this patient. Please utilize secure chat with additional questions, if there is no response within 30 minutes please call the above phone number.  Palliative Medicine Team providers are available by phone from 7am to 7pm daily and can be reached through the team cell phone.  Should this  patient require assistance outside of these hours, please call the patient's attending physician.

## 2022-07-11 NOTE — Progress Notes (Signed)
Pt taken off BIPAP and placed on hfnc 6l per MD. Pt is tolerating well. RT will monitor. RN aware.

## 2022-07-11 NOTE — ED Notes (Signed)
Finishing meal.

## 2022-07-11 NOTE — ED Notes (Signed)
[id number] pt not wearing O2 [place]. Returned to [place]. SPO2 improving. Son at [facility]. Encouraged pt to not talk, rest, breathe through nose. Discussed re-placing Bipap.

## 2022-07-11 NOTE — ED Notes (Signed)
Pt requesting mask with more O2, SPO2 reassurring, will assess need to return to bipap, RT called to assess.

## 2022-07-11 NOTE — ED Notes (Signed)
Alert, NAD, calm, interactive, congested wet productive cough, given y[place], VSS, RT at [facility], removed from Bipap to [place] 6L, will continue to monitor.

## 2022-07-11 NOTE — ED Notes (Signed)
Family at [facility] x2. Requesting to see admitting team, admitting notified, pt/family updated. Pt resting comfortably, on 4L [place].

## 2022-07-11 NOTE — ED Notes (Signed)
Palliative Care at [facility] speaking with pt and pt's son & daughter

## 2022-07-11 NOTE — Discharge Planning (Signed)
Pt active with [facility].  They are aware of admission and will visit pt and family in hospital for disposition needs.

## 2022-07-11 NOTE — ED Notes (Signed)
Pt given mouth swab and updated on plan of care

## 2022-07-12 ENCOUNTER — Other Ambulatory Visit (HOSPITAL_COMMUNITY): Payer: Self-pay

## 2022-07-12 DIAGNOSIS — J9621 Acute and chronic respiratory failure with hypoxia: Secondary | ICD-10-CM | POA: Diagnosis not present

## 2022-07-12 DIAGNOSIS — F0390 Unspecified dementia without behavioral disturbance: Secondary | ICD-10-CM | POA: Diagnosis not present

## 2022-07-12 DIAGNOSIS — I10 Essential (primary) hypertension: Secondary | ICD-10-CM | POA: Diagnosis not present

## 2022-07-12 DIAGNOSIS — A419 Sepsis, unspecified organism: Secondary | ICD-10-CM | POA: Diagnosis not present

## 2022-07-12 MED ORDER — GLYCOPYRROLATE 0.2 MG/ML IJ SOLN
0.4000 mg | INTRAMUSCULAR | Status: DC
  Administered 2022-07-12 – 2022-07-13 (×4): 0.4 mg via INTRAVENOUS
  Filled 2022-07-12 (×6): qty 2

## 2022-07-12 MED ORDER — MORPHINE SULFATE (PF) 4 MG/ML IV SOLN
4.0000 mg | Freq: Once | INTRAVENOUS | Status: AC
  Administered 2022-07-12: 4 mg via INTRAVENOUS
  Filled 2022-07-12: qty 1

## 2022-07-12 MED ORDER — MORPHINE BOLUS VIA INFUSION: 1.0000 mg | INTRAVENOUS | Status: DC | PRN

## 2022-07-12 MED ORDER — LORAZEPAM 2 MG/ML IJ SOLN
1.0000 mg | INTRAMUSCULAR | Status: DC
  Administered 2022-07-12 – 2022-07-13 (×4): 1 mg via INTRAVENOUS
  Filled 2022-07-12 (×4): qty 1

## 2022-07-12 MED ORDER — MORPHINE 100MG IN NS 100ML (1MG/ML) PREMIX INFUSION
2.0000 mg/h | INTRAVENOUS | Status: DC
  Administered 2022-07-12: 2 mg/h via INTRAVENOUS
  Filled 2022-07-12: qty 100

## 2022-07-12 NOTE — Progress Notes (Signed)
This chaplain responded to PMT PA-Josseline consult for EOL spiritual care and prayer. The chaplain reviewed the chart notes before the visit.  The Pt. family: daughter, son, and granddaughter are gathered at the bedside during the visit. The chaplain understands from daughter-[name] the Pt. EOL journey may be progressing quicker than the family anticipated. The family shares an understanding of the Pt. medications and remain focused on keeping the Pt. comfortable. The chaplain affirmed the family's presence.  The chaplain listened reflectively to the storytelling the Pt. may have wanted the chaplain to know. The chaplain understands the Pt. incorporate friends, family, and faith in has daily routine of community travel. The family agreed on the meaning for the Pt.  The chaplain ended the visit with prayer and reconnected with F/U spiritual care as needed.  Chaplain Sallyanne Kuster [phone]

## 2022-07-12 NOTE — Progress Notes (Signed)
Was able to get in touch with patient's daughter, [name]. Updated her on patient's status and decline in condition. She is trying to arrange to be able to come and be with her father. Asked me to call his son, Olman. This RN left voicemail.

## 2022-07-12 NOTE — Progress Notes (Signed)
[facility] PROGRESS NOTE    Progress Note  Cory Kemp  FGH:[id number] DOB: Apr 01, 1941 DOA: 08/01/2022 PCP: Jani Gravel, MD     Brief Narrative:   Cory Kemp is an 81 y.o. male past medical history significant for COPD on 4 L of oxygen dementia diabetes mellitus type 2 BPH comes in with sudden onset of shortness of breath was placed on BiPAP in the ED due to hypoxia and hypercarbia chest x-ray showed bilateral pulmonary infiltrates which are symmetric white count was 30,000, was started empirically on Rocephin and azithromycin   Assessment/Plan:   Acute respiratory failure with hypoxia probably secondary to community-acquired pneumonia and/or COPD exacerbation: Started on antibiotics and was weaned to 6 L of oxygen. Palliative Care met with family and they decided to move to full comfort measures based on IV morphine. Patient has agonal breathing moaning and groaning for consult on the morphine drip is hours to days.  Sepsis secondary to multifocal pneumonia: Everything was stopped as family requested to move towards full comfort measures.  Essential hypertension Blood pressure stable continue beta-blocker hold all other antihypertensive medication.  Hyperlipidemia: Acute metabolic encephalopathy superimposed on dementia without behavioral disturbance (HCC) Goals of care/ethics: Full comfort measures   DVT prophylaxis: Lovenox Family Communication:none Status is: Inpatient Remains inpatient appropriate because: Acute respiratory failure with hypoxia secondary to pneumonia.    Code Status:     Code Status Orders  (From admission, onward)           Start     Ordered   07/15/2022 2028  Full code  Continuous        07/31/2022 2028           Code Status History     Date Active Date Inactive Code Status Order ID Comments User Context   06/12/2022 1346 06/16/2022 2145 DNR [id number]  Lin Landsman, NP ED   06/12/2022 0941 06/12/2022 1346 Full Code [id number]   Karmen Bongo, MD ED   01/07/2022 0208 01/08/2022 1917 Full Code [id number]  Orene Desanctis, DO ED   07/11/2021 2114 07/16/2021 2027 Full Code [id number]  Marcelyn Bruins, MD ED   04/20/2020 0155 04/25/2020 0331 Full Code [id number]  Mikki Harbor, MD ED   04/19/2020 2359 04/20/2020 0155 Full Code [id number]  Vallery Ridge, Mohammadtokir, MD ED   02/11/2019 1249 02/13/2019 1809 Full Code [id number]  Kerney Elbe, DO ED   02/09/2019 0114 02/09/2019 1834 Full Code [id number]  Vianne Bulls, MD Inpatient   12/25/2018 0456 12/26/2018 1533 Full Code [id number]  Shela Leff, MD ED   04/11/2016 0017 04/11/2016 1928 Full Code [id number]  Norval Morton, MD Inpatient         IV Access:   Peripheral IV   Procedures and diagnostic studies:   DG Chest Port 1 View  Result Date: 07/16/2022 CLINICAL DATA:  Shortness of breath EXAM: PORTABLE CHEST 1 VIEW COMPARISON:  06/23/2022 FINDINGS: Heart is normal size. Patchy bilateral airspace opacities in the mid and lower lungs. Mild vascular congestion. No effusions or acute bony abnormality. Mild hyperinflation/COPD. Aortic atherosclerosis. IMPRESSION: Hyperinflation/COPD. Patchy bilateral airspace opacities, right greater than left. This could reflect asymmetric edema or infection. Electronically Signed   By: Rolm Baptise M.D.   On: 07/31/2022 19:07     Medical Consultants:   None.   Subjective:    Cory Kemp uncomfortable moaning and groaning nonverbal  Objective:    Vitals:   07/11/22 2042 07/11/22 2151 07/11/22 2158 07/12/22 0300  BP:   (!) 145/61 129/67  Pulse:  (!) 117 (!) 125 89  Resp:  18 (!) 36   Temp: 99 F (37.2 C)   98 F (36.7 C)  TempSrc: Oral   Axillary  SpO2:  90% (!) 88% 94%  Weight:      Height:       SpO2: 94 % O2 Flow Rate (L/min): 6 L/min FiO2 (%): 100 %  No intake or output data in the 24 hours ending 07/12/22 1014  Filed Weights   08/04/2022 1629  Weight: 94 kg    Exam: General exam: In no acute  distress. Respiratory system: Good air movement and crackles at bases. Cardiovascular system: S1 & S2 heard, RRR. No JVD. Gastrointestinal system: Abdomen is nondistended, soft and nontender.  Extremities: No pedal edema. Skin: No rashes, lesions or ulcers  Data Reviewed:    Labs: Basic Metabolic Panel: Recent Labs  Lab 07/24/2022 1628 07/12/2022 1734 07/11/22 0200  NA 140 140 140  K 4.1 4.1 4.9  CL 101  --  104  CO2 27  --  27  GLUCOSE 201*  --  166*  BUN 29*  --  31*  CREATININE 1.22  --  1.04  CALCIUM 9.3  --  8.7*  MG  --   --  2.5*  PHOS  --   --  4.3    GFR Estimated Creatinine Clearance: 63 mL/min (by C-G formula based on SCr of 1.04 mg/dL). Liver Function Tests: Recent Labs  Lab 07/11/22 0200  AST 20  ALT 16  ALKPHOS 54  BILITOT 0.5  PROT 6.3*  ALBUMIN 2.9*    No results for input(s): "LIPASE", "AMYLASE" in the last 168 hours. No results for input(s): "AMMONIA" in the last 168 hours. Coagulation profile No results for input(s): "INR", "PROTIME" in the last 168 hours. COVID-19 Labs  No results for input(s): "DDIMER", "FERRITIN", "LDH", "CRP" in the last 72 hours.  Lab Results  Component Value Date   SARSCOV2NAA NEGATIVE 07/30/2022   SARSCOV2NAA NEGATIVE 06/23/2022   SARSCOV2NAA NEGATIVE 06/12/2022   [place] NEGATIVE 07/11/2021    CBC: Recent Labs  Lab 08/03/2022 1628 07/18/2022 1734 07/11/22 0200  WBC 30.7*  --  20.6*  NEUTROABS 23.6*  --  18.7*  HGB 13.1 13.3 11.9*  HCT 42.5 39.0 38.7*  MCV 95.1  --  94.4  PLT 176  --  151    Cardiac Enzymes: No results for input(s): "CKTOTAL", "CKMB", "CKMBINDEX", "TROPONINI" in the last 168 hours. BNP (last 3 results) No results for input(s): "PROBNP" in the last 8760 hours. CBG: No results for input(s): "GLUCAP" in the last 168 hours. D-Dimer: No results for input(s): "DDIMER" in the last 72 hours. Hgb A1c: No results for input(s): "HGBA1C" in the last 72 hours. Lipid Profile: No results for  input(s): "CHOL", "HDL", "LDLCALC", "TRIG", "CHOLHDL", "LDLDIRECT" in the last 72 hours. Thyroid function studies: No results for input(s): "TSH", "T4TOTAL", "T3FREE", "THYROIDAB" in the last 72 hours.  Invalid input(s): "FREET3" Anemia work up: No results for input(s): "VITAMINB12", "FOLATE", "FERRITIN", "TIBC", "IRON", "RETICCTPCT" in the last 72 hours. Sepsis Labs: Recent Labs  Lab 08/01/2022 1628 07/11/22 0200  PROCALCITON  --  0.40  WBC 30.7* 20.6*    Microbiology Recent Results (from the past 240 hour(s))  Resp Panel by RT-PCR (Flu A&B, Covid) Anterior Nasal Swab     Status: None   Collection Time: 08/01/2022  4:28 PM   Specimen: Anterior Nasal Swab  Result Value Ref  Range Status   SARS Coronavirus 2 by RT PCR NEGATIVE NEGATIVE Final    Comment: (NOTE) SARS-CoV-2 target nucleic acids are NOT DETECTED.  The SARS-CoV-2 RNA is generally detectable in upper respiratory specimens during the acute phase of infection. The lowest concentration of SARS-CoV-2 viral copies this assay can detect is 138 copies/mL. A negative result does not preclude SARS-Cov-2 infection and should not be used as the sole basis for treatment or other patient management decisions. A negative result may occur with  improper specimen collection/handling, submission of specimen other than nasopharyngeal swab, presence of viral mutation(s) within the areas targeted by this assay, and inadequate number of viral copies(<138 copies/mL). A negative result must be combined with clinical observations, patient history, and epidemiological information. The expected result is Negative.  Fact Sheet for Patients:  EntrepreneurPulse.com.au  Fact Sheet for Healthcare Providers:  IncredibleEmployment.be  This test is no t yet approved or cleared by the [place] FDA and  has been authorized for detection and/or diagnosis of SARS-CoV-2 by FDA under an Emergency Use Authorization  (EUA). This EUA will remain  in effect (meaning this test can be used) for the duration of the COVID-19 declaration under Section 564(b)(1) of the Act, 21 U.S.C.section 360bbb-3(b)(1), unless the authorization is terminated  or revoked sooner.       Influenza A by PCR NEGATIVE NEGATIVE Final   Influenza B by PCR NEGATIVE NEGATIVE Final    Comment: (NOTE) The Xpert Xpress SARS-CoV-2/FLU/RSV plus assay is intended as an aid in the diagnosis of influenza from Nasopharyngeal swab specimens and should not be used as a sole basis for treatment. Nasal washings and aspirates are unacceptable for Xpert Xpress SARS-CoV-2/FLU/RSV testing.  Fact Sheet for Patients: EntrepreneurPulse.com.au  Fact Sheet for Healthcare Providers: IncredibleEmployment.be  This test is not yet approved or cleared by the [place] FDA and has been authorized for detection and/or diagnosis of SARS-CoV-2 by FDA under an Emergency Use Authorization (EUA). This EUA will remain in effect (meaning this test can be used) for the duration of the COVID-19 declaration under Section 564(b)(1) of the Act, 21 U.S.C. section 360bbb-3(b)(1), unless the authorization is terminated or revoked.  Performed at [facility], [place] [address], [place], [place] [address]   Culture, blood (routine x 2)     Status: None (Preliminary result)   Collection Time: 07/22/2022  6:33 PM   Specimen: BLOOD RIGHT FOREARM  Result Value Ref Range Status   Specimen Description BLOOD RIGHT FOREARM  Final   Special Requests   Final    BOTTLES DRAWN AEROBIC AND ANAEROBIC Blood Culture results may not be optimal due to an inadequate volume of blood received in culture bottles   Culture   Final    NO GROWTH 2 DAYS Performed at [facility], [place] [address], [place], [place] [address]    Report Status PENDING  Incomplete  Culture, blood (routine x 2)     Status: None (Preliminary result)   Collection  Time: 07/12/2022  8:52 PM   Specimen: BLOOD LEFT HAND  Result Value Ref Range Status   Specimen Description BLOOD LEFT HAND  Final   Special Requests   Final    BOTTLES DRAWN AEROBIC AND ANAEROBIC Blood Culture results may not be optimal due to an inadequate volume of blood received in culture bottles   Culture   Final    NO GROWTH 2 DAYS Performed at [facility], [place] [address] [place], [place] [address]  Report Status PENDING  Incomplete     Medications:    escitalopram  20 mg Oral Daily   LORazepam  1 mg Oral Q8H   methylPREDNISolone sodium succinate  80 mg Intravenous Daily   montelukast  10 mg Oral QHS    morphine injection  1 mg Intravenous Q4H   Continuous Infusions:      LOS: 2 days   Charlynne Cousins  [facility]  07/12/2022, 10:14 AM

## 2022-07-12 NOTE — Progress Notes (Signed)
Unable to complete admission workflow due to patient status. Attempted to call the two emergency contacts listed on chart to provide update on patient and both calls went to voicemail. No family at bedside.

## 2022-07-12 NOTE — TOC Initial Note (Addendum)
Transition of Care [facility]) - Initial/Assessment Note    Patient Details  Name: Cory Kemp MRN: [id number] Date of Birth: 07/04/1941  Transition of Care [facility]) CM/SW Contact:    Curlene Labrum, RN Phone Number: 07/12/2022, 11:01 AM  Clinical Narrative:                 Cm met with the patient and daughter at the bedside.  The patient is resting quietly on 5L/min [place] oxygen.  The patient is arousable not non-responsive at this time.  The patient's daughter is at the bedside.    I gave the patient's daughter Medicare choice regarding Inpatient hospice facility and the patient's [facility] chose [facility].  I called Nicholaus Corolla, RNCM with [facility] and left her a voicemail message and secure chat regarding referral.  The patient's daughter and son are present in the room.  Attending physician plans to round on patient for assessment and bedside nursing is aware Inpatient Hospice Referral was placed.  07/12/2022 1117 - Dr. Olevia Bowens rounded on the patient at the patient to remain inpatient and unable to admit to [facility] - agonal respirations.  Expected Discharge Plan: [place] Barriers to Discharge: Continued Medical Work up   Patient Goals and CMS Choice Patient states their goals for this hospitalization and ongoing recovery are:: Patient's family requests [facility] - referral placed CMS Medicare.gov Compare Post Acute Care list provided to:: Patient Represenative (must comment) (Patient's daughter requests Inpatient H[facility]ice at [facility]) Choice offered to / list presented to : Adult Children  Expected Discharge Plan and Services Expected Discharge Plan: [place] In-house Referral: Hospice / Palliative Care Discharge Planning Services: CM Consult Post Acute Care Choice: Residential Hospice Bed Living arrangements for the past 2 months: Mobile Home                                      Prior Living  Arrangements/Services Living arrangements for the past 2 months: Mobile Home   Patient language and need for interpreter reviewed:: Yes Do you feel safe going back to the place where you live?: Yes      Need for Family Participation in Patient Care: Yes (Comment) Care giver support system in place?: Yes (comment)   Criminal Activity/Legal Involvement Pertinent to Current Situation/Hospitalization: No - Comment as needed  Activities of Daily Living      Permission Sought/Granted Permission sought to share information with : Case Manager, Family Supports, Customer service manager       Permission granted to share info w AGENCY: Inpatient Hospice Facility Referral  Permission granted to share info w Relationship: daughter and son present in room     Emotional Assessment Appearance:: Appears stated age Attitude/Demeanor/Rapport: Unresponsive Affect (typically observed): Unable to Assess Orientation: :  (Arouses but is unresponsive - on 5L/[place] oxygen) Alcohol / Substance Use: Not Applicable Psych Involvement: No (comment)  Admission diagnosis:  CAP (community acquired pneumonia) [J18.9] Acute on chronic respiratory failure with hypoxia ([place]) [J96.21] Patient Active Problem List   Diagnosis Date Noted   Sepsis ([place]) 07/11/2022   Hyperlipidemia 07/11/2022   Comfort measures only status 07/11/2022   Dyslipidemia 06/12/2022   Dementia without behavioral disturbance ([place]) 06/12/2022   COPD exacerbation ([place]) 01/07/2022   SVT (supraventricular tachycardia) 07/12/2021   Acute on chronic respiratory failure with hypoxia ([place]) 07/11/2021   Chronic neck pain 07/11/2021  History of malignant neoplasm of prostate 07/11/2021   Oxygen dependent 07/11/2021   Pure hypercholesterolemia 07/11/2021   Elevated troponin 04/20/2020   PVC's (premature ventricular contractions) 04/20/2020   Pneumonia due to COVID-19 virus 04/20/2020   Medication management 03/05/2019   COPD with acute  exacerbation ([place]) 02/08/2019   Hypertensive urgency 02/08/2019   CAP (community acquired pneumonia) 12/25/2018   Elevated d-dimer 12/25/2018   Chronic respiratory failure with hypoxia ([place]) 12/13/2018   Abnormal finding on EKG 01/03/2018   Tachycardia 04/11/2016   Essential hypertension 04/11/2016   COPD (chronic obstructive pulmonary disease) ([place]) 01/11/2016   B12 deficiency 11/03/2013   BPH (benign prostatic hyperplasia) 01/31/2013   Leukopenia 01/29/2013   Monocytosis 01/29/2013   Normochromic anemia 01/29/2013   Former smoker 03/27/2012   Diabetes mellitus ([place]) 01/10/2012   PCP:  Jani Gravel, MD Pharmacy:   [facility], [address] [place] [address] Phone: [phone] Fax: [phone]  [facility], [place] [address] [place] [address] Phone: [phone] Fax: [place], [place] - [address] [place] [address] Phone: [phone] Fax: [place], [place] [address] Phone: [phone] Fax: [phone]     Social Determinants of Health (SDOH) Interventions    Readmission Risk Interventions    07/12/2022   10:54 AM  Readmission Risk Prevention Plan  Transportation Screening Complete  Medication Review ([place]) Complete  PCP or Specialist appointment within 3-5 days of discharge Complete  HRI or [place] Complete  SW Recovery Care/Counseling Consult Complete  Palliative Care Screening Complete  [place] Not Applicable

## 2022-07-12 NOTE — Progress Notes (Signed)
Daily Progress Note   Patient Name: Cory Kemp       Date: 07/12/2022 DOB: 04/05/1941  Age: 81 y.o. MRN#: [id number] Attending Physician: Charlynne Cousins, MD Primary Care Physician: Jani Gravel, MD Admit Date: 07/22/2022  Reason for Consultation/Follow-up: Terminal Care  Subjective: Medical records reviewed. Patient assessed at the bedside.  He is comfortable on morphine drip at the time of my visit.  He is still on 6 L O2 via nasal cannula.  His son and daughter are present visiting.  Discussed with RN.  Emotional support and therapeutic listening was provided as patient's children reflected on the course of events overnight and rapid decline since our conversation.  We discussed the role of increasing carbon dioxide and I provided education on the expected natural disease trajectory.  We discussed the patient has had great relief with scheduling of benzodiazepines and opioids and that he would prefer to avoid a prolonged dying process.  They are at peace with his impending demise, as he would have had a much worse quality of life without being able to drive around the community.  We discussed weaning oxygen to room air and titrating morphine drip accordingly, as oxygen prolong the dying process and does not provide comfort.  Patient's family is agreeable.  We discussed their concerns about timing of patient's death and frequency of their visitation in the context of work schedules.  Encouraged patient's family to do what they think is best and what they would feel good about when looking back upon this delicate time after Clair Gulling has passed.  Reviewed use of Robinul for excessive secretions and made recommendations to increase dosage and scheduling more frequently.  Questions and concerns  addressed. PMT will continue to support holistically.   Length of Stay: 2  Physical Exam Vitals and nursing note reviewed.  Constitutional:      Appearance: He is ill-appearing.     Interventions: Nasal cannula in place.     Comments: 6L  Cardiovascular:     Rate and Rhythm: Normal rate.  Pulmonary:     Effort: Pulmonary effort is normal. No accessory muscle usage or respiratory distress.  Skin:    General: Skin is cool and dry.  Neurological:     Mental Status: He is unresponsive.  Vital Signs: BP 133/70 (BP Location: Right Arm)   Pulse 83   Temp 97.7 F (36.5 C) (Oral)   Resp 18   Ht '6\' 1"'$  (1.854 m)   Wt 94 kg   SpO2 100%   BMI 27.34 kg/m  SpO2: SpO2: 100 % O2 Device: O2 Device: Room Air O2 Flow Rate: O2 Flow Rate (L/min): 6 L/min      Palliative Assessment/Data: 10%   Palliative Care Assessment & Plan   Patient Profile: 81 y.o. male  with past medical history of COPD on 4 L nasal cannula continuously, dementia, type 2 diabetes, hyperlipidemia, hypertension, BPH admitted on 07/28/2022 with sudden onset shortness of breath.    In the ED, the patient was placed on BiPAP due to increased work of breathing, hypoxia, and hypercarbia, with improvement. Currently admitted for acute on chronic respiratory failure secondary to CAP. Patient is enrolled in [place] at home. PMT has been consulted to assist with goals of care conversation.   Assessment: End-of-life care  Recommendations/Plan: Continue DNR Continue comfort care Ordered as needed morphine boluses every 15 minutes for dyspnea/air hunger, discussed with RN to wean oxygen as tolerated to room air Scheduled IV Ativan 1 mg every 4 hours Scheduled Robinul 0.4 mg every 4 hours Psychosocial and emotional support provided PMT will continue to follow and support  Prognosis:  Hours - Days  Discharge Planning: Anticipated Hospital Death   MDM high         [place],  PA-C  Palliative Medicine Team Team phone # [phone]  Thank you for allowing the Palliative Medicine Team to assist in the care of this patient. Please utilize secure chat with additional questions, if there is no response within 30 minutes please call the above phone number.  Palliative Medicine Team providers are available by phone from 7am to 7pm daily and can be reached through the team cell phone.  Should this patient require assistance outside of these hours, please call the patient's attending physician.

## 2022-07-12 NOTE — Progress Notes (Signed)
[facility] 2W30 [facility]) Hospital Liaison Note  Received request from [place], [facility] for family interest in [facility]. Per Hospital Dr. Patient is not stable enough for transfer and is anticipate to be a hospital death.   Please do not hesitate to call with any questions.    Thank you, Zigmund Gottron RN  [facility] Hospital Liaison [phone]

## 2022-07-12 NOTE — Progress Notes (Signed)
Daughter Cory Kemp at bedside. Patient resting comfortably.

## 2022-07-12 NOTE — Consult Note (Signed)
   [facility] CM Inpatient Consult   07/12/2022  RIELEY HAUSMAN 11/01/1940 [id number]  [place] [facility] [ACO] Patient: [employer]  *Readmission less than 30 days with extreme high risk scores  Chart reviewed and reveals the patient is currently transitioning to full comfort measure.  Plan: Given this choice there are no [facility] Care Coordination planned for transitional needs. Will sign off.  For questions,   Natividad Brood, RN BSN [place]  [phone] business mobile phone Toll free office [phone]  *[place]  [phone] Fax number: [phone] [email or web address]

## 2022-07-13 DIAGNOSIS — J9621 Acute and chronic respiratory failure with hypoxia: Secondary | ICD-10-CM | POA: Diagnosis not present

## 2022-07-13 DIAGNOSIS — Z515 Encounter for palliative care: Secondary | ICD-10-CM

## 2022-07-15 LAB — CULTURE, BLOOD (ROUTINE X 2)
Culture: NO GROWTH
Culture: NO GROWTH

## 2022-08-08 NOTE — Progress Notes (Signed)
TRH night cross cover note:   I was notified by RN that this patient, who was on comfort care, has passed and was pronounced. Time of death noted to be 0116 on 2022/08/01.       Babs Bertin, DO Hospitalist

## 2022-08-08 NOTE — Progress Notes (Cosign Needed)
Morphine Sulfate bag and tubing found in med room morning of 08-03-2022. Patient expired on prior shift.   Pharmacy called to verify how to waste medication after patient removed from pyxis. Pharmacy advised RN to create progress note after wasting with another RN.   '55mg'$  of Morphine '100mg'$  in NS 146m infusion wasted with INigerHeaden, RN into sChief Operating Officer

## 2022-08-08 NOTE — Discharge Summary (Signed)
Death Summary  Cory Kemp HQP:[id number] DOB: 1941/03/18 DOA: 30-Jul-2022  PCP: Jani Gravel, MD  Admit date: 07/30/22 Date of Death: Aug 02, 2022 Time of [id number] Notification: Jani Gravel, MD notified of death of 08/04/22   History of present illness:  Cory Kemp is a 82 y.o. male past medical history significant for COPD on 4 L of oxygen dementia diabetes mellitus type 2 BPH comes in with sudden onset of shortness of breath was placed on BiPAP in the ED due to hypoxia and hypercarbia chest x-ray showed bilateral pulmonary infiltrates which are symmetric white count was 30,000, was started empirically on Rocephin and azithromycin   Acute respiratory failure with hypoxia probably secondary to community-acquired pneumonia and/or COPD exacerbation: Started on antibiotics  and bipap on admission,  patient and family did not want intubation. Palliative Care met with family and they decided to move to full comfort measures. He was started on morphine drip, as he was struggling with respiration. He passed comfortable on 08-02-2022   Sepsis secondary to multifocal pneumonia:  Essential hypertension   Hyperlipidemia: Acute metabolic encephalopathy superimposed on dementia without behavioral disturbance (HCC) Goals of care/ethics: Full comfort measures  The results of significant diagnostics from this hospitalization (including imaging, microbiology, ancillary and laboratory) are listed below for reference.    Significant Diagnostic Studies: DG Chest Port 1 View  Result Date: 07/30/22 CLINICAL DATA:  Shortness of breath EXAM: PORTABLE CHEST 1 VIEW COMPARISON:  06/23/2022 FINDINGS: Heart is normal size. Patchy bilateral airspace opacities in the mid and lower lungs. Mild vascular congestion. No effusions or acute bony abnormality. Mild hyperinflation/COPD. Aortic atherosclerosis. IMPRESSION: Hyperinflation/COPD. Patchy bilateral airspace opacities, right greater than left. This could  reflect asymmetric edema or infection. Electronically Signed   By: Rolm Baptise M.D.   On: 07-30-2022 19:07   DG Chest Port 1 View  Result Date: 06/23/2022 CLINICAL DATA:  Shortness of breath EXAM: PORTABLE CHEST 1 VIEW COMPARISON:  Previous studies including the examination of 06/12/2022 FINDINGS: Transverse diameter of heart is increased. Low position of diaphragms suggests COPD. There are no signs of alveolar pulmonary edema. Crowding of markings in the medial right lower lung fields may be due to large bullous emphysema in right lower lung fields seen in the previous CT. There is no focal pulmonary consolidation. There is no pleural effusion or pneumothorax. IMPRESSION: COPD. Increased markings in the medial right lower lung field may suggest crowding of bronchovascular structures due to large bulla in the lateral aspect of right lower lung field or suggest subsegmental atelectasis/pneumonia. Electronically Signed   By: Elmer Picker M.D.   On: 06/23/2022 08:26    Microbiology: Recent Results (from the past 240 hour(s))  Resp Panel by RT-PCR (Flu A&B, Covid) Anterior Nasal Swab     Status: None   Collection Time: July 30, 2022  4:28 PM   Specimen: Anterior Nasal Swab  Result Value Ref Range Status   SARS Coronavirus 2 by RT PCR NEGATIVE NEGATIVE Final    Comment: (NOTE) SARS-CoV-2 target nucleic acids are NOT DETECTED.  The SARS-CoV-2 RNA is generally detectable in upper respiratory specimens during the acute phase of infection. The lowest concentration of SARS-CoV-2 viral copies this assay can detect is 138 copies/mL. A negative result does not preclude SARS-Cov-2 infection and should not be used as the sole basis for treatment or other patient management decisions. A negative result may occur with  improper specimen collection/handling, submission of specimen other than nasopharyngeal swab, presence of viral mutation(s) within the areas targeted  by this assay, and inadequate number of  viral copies(<138 copies/mL). A negative result must be combined with clinical observations, patient history, and epidemiological information. The expected result is Negative.  Fact Sheet for Patients:  EntrepreneurPulse.com.au  Fact Sheet for Healthcare Providers:  IncredibleEmployment.be  This test is no t yet approved or cleared by the [place] FDA and  has been authorized for detection and/or diagnosis of SARS-CoV-2 by FDA under an Emergency Use Authorization (EUA). This EUA will remain  in effect (meaning this test can be used) for the duration of the COVID-19 declaration under Section 564(b)(1) of the Act, 21 U.S.C.section 360bbb-3(b)(1), unless the authorization is terminated  or revoked sooner.       Influenza A by PCR NEGATIVE NEGATIVE Final   Influenza B by PCR NEGATIVE NEGATIVE Final    Comment: (NOTE) The Xpert Xpress SARS-CoV-2/FLU/RSV plus assay is intended as an aid in the diagnosis of influenza from Nasopharyngeal swab specimens and should not be used as a sole basis for treatment. Nasal washings and aspirates are unacceptable for Xpert Xpress SARS-CoV-2/FLU/RSV testing.  Fact Sheet for Patients: EntrepreneurPulse.com.au  Fact Sheet for Healthcare Providers: IncredibleEmployment.be  This test is not yet approved or cleared by the [place] FDA and has been authorized for detection and/or diagnosis of SARS-CoV-2 by FDA under an Emergency Use Authorization (EUA). This EUA will remain in effect (meaning this test can be used) for the duration of the COVID-19 declaration under Section 564(b)(1) of the Act, 21 U.S.C. section 360bbb-3(b)(1), unless the authorization is terminated or revoked.  Performed at [facility], [place] [address], [place], [place] [address]   Culture, blood (routine x 2)     Status: None   Collection Time: 07/24/2022  6:33 PM   Specimen: BLOOD RIGHT  FOREARM  Result Value Ref Range Status   Specimen Description BLOOD RIGHT FOREARM  Final   Special Requests   Final    BOTTLES DRAWN AEROBIC AND ANAEROBIC Blood Culture results may not be optimal due to an inadequate volume of blood received in culture bottles   Culture   Final    NO GROWTH 5 DAYS Performed at [facility], [place] [address], [place], [place] [address]    Report Status 07/15/2022 FINAL  Final  Culture, blood (routine x 2)     Status: None   Collection Time: 08/03/2022  8:52 PM   Specimen: BLOOD LEFT HAND  Result Value Ref Range Status   Specimen Description BLOOD LEFT HAND  Final   Special Requests   Final    BOTTLES DRAWN AEROBIC AND ANAEROBIC Blood Culture results may not be optimal due to an inadequate volume of blood received in culture bottles   Culture   Final    NO GROWTH 5 DAYS Performed at [facility], [place] [address] [place], [place] [address]    Report Status 07/15/2022 FINAL  Final     Labs: Basic Metabolic Panel: Recent Labs  Lab 08/03/2022 1628 07/27/2022 1734 07/11/22 0200  NA 140 140 140  K 4.1 4.1 4.9  CL 101  --  104  CO2 27  --  27  GLUCOSE 201*  --  166*  BUN 29*  --  31*  CREATININE 1.22  --  1.04  CALCIUM 9.3  --  8.7*  MG  --   --  2.5*  PHOS  --   --  4.3   Liver Function Tests: Recent Labs  Lab 07/11/22 0200  AST 20  ALT 16  ALKPHOS 54  BILITOT 0.5  PROT 6.3*  ALBUMIN 2.9*   No results for input(s): "LIPASE", "AMYLASE" in the last 168 hours. No results for input(s): "AMMONIA" in the last 168 hours. CBC: Recent Labs  Lab 08/05/2022 1628 07/21/2022 1734 07/11/22 0200  WBC 30.7*  --  20.6*  NEUTROABS 23.6*  --  18.7*  HGB 13.1 13.3 11.9*  HCT 42.5 39.0 38.7*  MCV 95.1  --  94.4  PLT 176  --  151   Cardiac Enzymes: No results for input(s): "CKTOTAL", "CKMB", "CKMBINDEX", "TROPONINI" in the last 168 hours. D-Dimer No results for input(s): "DDIMER" in the last 72 hours. BNP: Invalid input(s):  "POCBNP" CBG: No results for input(s): "GLUCAP" in the last 168 hours. Anemia work up No results for input(s): "VITAMINB12", "FOLATE", "FERRITIN", "TIBC", "IRON", "RETICCTPCT" in the last 72 hours. Urinalysis    Component Value Date/Time   COLORURINE YELLOW 04/19/2020 1926   APPEARANCEUR CLEAR 04/19/2020 1926   LABSPEC 1.016 04/19/2020 1926   PHURINE 5.0 04/19/2020 1926   GLUCOSEU NEGATIVE 04/19/2020 1926   HGBUR LARGE (A) 04/19/2020 1926   BILIRUBINUR NEGATIVE 04/19/2020 1926   KETONESUR 5 (A) 04/19/2020 1926   PROTEINUR 100 (A) 04/19/2020 1926   UROBILINOGEN 0.2 07/22/2013 0725   NITRITE NEGATIVE 04/19/2020 1926   LEUKOCYTESUR NEGATIVE 04/19/2020 1926   Sepsis Labs Recent Labs  Lab 08/07/2022 1628 07/11/22 0200  WBC 30.7* 20.6*       SIGNED:  Charlynne Cousins, MD  [facility] 07/15/2022, 11:47 AM Pager   If 7PM-7AM, please contact night-coverage [email or web address] Password [email or web address]

## 2022-08-08 NOTE — Progress Notes (Signed)
Morphine drip discontinued at time of death. Attempted to waste morphine in the pyxis, but did not have the option to waste this medication in the pyxis system. Pharmacy called and gave instruction on finding patient in the pyxis nursing med link, but was still unable to find patient. Pharmacy instructed this RN to waste with charge RN. Patient received a total of 51.56 mL. 48.44 mL wasted with Velna Hatchet, RN. Wasted in medication room in appropriate hazardous waste bucket.

## 2022-08-08 DEATH — deceased
